# Patient Record
Sex: Female | Born: 1975 | Hispanic: No | Marital: Married | Smoking: Never smoker
Health system: Southern US, Community
[De-identification: ages and names within clinical notes are randomized; demographics above are authoritative.]

## PROBLEM LIST (undated history)

## (undated) DIAGNOSIS — B379 Candidiasis, unspecified: Secondary | ICD-10-CM

## (undated) DIAGNOSIS — G43909 Migraine, unspecified, not intractable, without status migrainosus: Secondary | ICD-10-CM

## (undated) DIAGNOSIS — N926 Irregular menstruation, unspecified: Secondary | ICD-10-CM

## (undated) DIAGNOSIS — C801 Malignant (primary) neoplasm, unspecified: Secondary | ICD-10-CM

## (undated) HISTORY — DX: Migraine, unspecified, not intractable, without status migrainosus: G43.909

## (undated) HISTORY — DX: Candidiasis, unspecified: B37.9

---
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
## 2006-01-14 ENCOUNTER — Other Ambulatory Visit: Admission: RE | Admit: 2006-01-14 | Discharge: 2006-01-14 | Payer: Self-pay | Admitting: Obstetrics and Gynecology

## 2011-03-01 ENCOUNTER — Emergency Department (HOSPITAL_COMMUNITY)
Admission: EM | Admit: 2011-03-01 | Discharge: 2011-03-01 | Disposition: A | Discharge status: Home / Self Care | Payer: BC Managed Care – PPO | Attending: Emergency Medicine | Admitting: Emergency Medicine

## 2011-03-01 DIAGNOSIS — J309 Allergic rhinitis, unspecified: Secondary | ICD-10-CM | POA: Insufficient documentation

## 2011-03-01 DIAGNOSIS — J329 Chronic sinusitis, unspecified: Secondary | ICD-10-CM | POA: Insufficient documentation

## 2011-03-01 DIAGNOSIS — R51 Headache: Secondary | ICD-10-CM | POA: Insufficient documentation

## 2011-03-01 DIAGNOSIS — H9209 Otalgia, unspecified ear: Secondary | ICD-10-CM | POA: Insufficient documentation

## 2012-12-16 ENCOUNTER — Ambulatory Visit (INDEPENDENT_AMBULATORY_CARE_PROVIDER_SITE_OTHER): Payer: BC Managed Care – PPO | Admitting: Obstetrics and Gynecology

## 2012-12-16 ENCOUNTER — Encounter: Payer: Self-pay | Admitting: Obstetrics and Gynecology

## 2012-12-16 VITALS — BP 110/68 | HR 72 | Ht 69.0 in | Wt 130.0 lb

## 2012-12-16 DIAGNOSIS — Z124 Encounter for screening for malignant neoplasm of cervix: Secondary | ICD-10-CM

## 2012-12-16 DIAGNOSIS — Z01419 Encounter for gynecological examination (general) (routine) without abnormal findings: Secondary | ICD-10-CM

## 2012-12-16 DIAGNOSIS — Z309 Encounter for contraceptive management, unspecified: Secondary | ICD-10-CM

## 2012-12-16 DIAGNOSIS — IMO0001 Reserved for inherently not codable concepts without codable children: Secondary | ICD-10-CM

## 2012-12-16 LAB — POCT URINE PREGNANCY: Preg Test, Ur: NEGATIVE

## 2012-12-16 NOTE — Progress Notes (Signed)
Regular Periods: yes Mammogram: no  Monthly Breast Ex.: yes Exercise: yes  Tetanus < 10 years: no Seatbelts: yes  NI. Bladder Functn.: yes Abuse at home: no  Daily BM's: yes Stressful Work: yes  Healthy Diet: yes Sigmoid-Colonoscopy: no  Calcium: no Medical problems this year: want to discuss birth control    LAST PAP:8/11  Contraception: condoms  Mammogram:  no  PCP: no  PMH: no changes  FMH: Dad dx with Lung cancer   7/13  Last Bone Scan: no   PT IS DIVORCED.

## 2012-12-16 NOTE — Progress Notes (Signed)
Subjective:    Leslie [name] is a 37 y.o. female, G2P2, who presents for an annual exam. The patient reports increased acne since stopping contraception in August.  Menstrual cycle:   LMP: Patient's last menstrual period was 12/02/2012.             Review of Systems Pertinent items are noted in HPI. Denies pelvic pain, urinary tract symptoms, vaginitis symptoms, irregular bleeding, menopausal symptoms, change in bowel habits or rectal bleeding   Objective:    BP 110/68  Pulse 72  Ht 5\' 9"  (1.753 m)  Wt 130 lb (58.968 kg)  BMI 19.20 kg/m2  LMP 12/02/2012   Wt Readings from Last 1 Encounters:  12/16/12 130 lb (58.968 kg)   Body mass index is 19.20 kg/(m^2). General Appearance: Alert, no acute distress HEENT: Grossly normal Neck / Thyroid: Supple, no thyromegaly or cervical adenopathy Lungs: Clear to auscultation bilaterally Back: No CVA tenderness Breast Exam: No masses or nodes.No dimpling, nipple retraction or discharge. Cardiovascular: Regular rate and rhythm.  Gastrointestinal: Soft, non-tender, no masses or organomegaly Pelvic Exam: EGBUS-wnl, vagina-normal rugae, cervix- without lesions or tenderness, uterus appears normal size shape and consistency, adnexae-no masses or tenderness Lymphatic Exam: Non-palpable nodes in neck, clavicular,  axillary, or inguinal regions  Skin: no rashes or abnormalities Extremities: no clubbing cyanosis or edema  Neurologic: grossly normal Psychiatric: Alert and oriented   Assessment:   Routine GYN Exam   Plan:  IUD Information Sheet,  Paragard Handout  Reviewed Paragard/Mirena IUD MOA, insertion, R & B including perforation and expulsion  PAP sent  RTO 1 year or prn  Unknown Flannigan,ELMIRAPA-C

## 2012-12-19 LAB — PAP IG W/ RFLX HPV ASCU

## 2014-10-15 ENCOUNTER — Encounter: Payer: Self-pay | Admitting: Obstetrics and Gynecology

## 2016-03-04 ENCOUNTER — Other Ambulatory Visit: Payer: Self-pay | Admitting: Obstetrics and Gynecology

## 2016-03-04 DIAGNOSIS — N6489 Other specified disorders of breast: Secondary | ICD-10-CM

## 2016-03-04 DIAGNOSIS — R928 Other abnormal and inconclusive findings on diagnostic imaging of breast: Secondary | ICD-10-CM

## 2016-03-11 ENCOUNTER — Ambulatory Visit
Admission: RE | Admit: 2016-03-11 | Discharge: 2016-03-11 | Disposition: A | Discharge status: Home / Self Care | Payer: BLUE CROSS/BLUE SHIELD | Source: Ambulatory Visit | Attending: Obstetrics and Gynecology | Admitting: Obstetrics and Gynecology

## 2016-03-11 ENCOUNTER — Other Ambulatory Visit: Payer: Self-pay

## 2016-03-11 DIAGNOSIS — R928 Other abnormal and inconclusive findings on diagnostic imaging of breast: Secondary | ICD-10-CM

## 2016-03-26 DIAGNOSIS — M791 Myalgia: Secondary | ICD-10-CM | POA: Diagnosis not present

## 2016-03-26 DIAGNOSIS — M9902 Segmental and somatic dysfunction of thoracic region: Secondary | ICD-10-CM | POA: Diagnosis not present

## 2016-03-26 DIAGNOSIS — M9901 Segmental and somatic dysfunction of cervical region: Secondary | ICD-10-CM | POA: Diagnosis not present

## 2016-03-26 DIAGNOSIS — M9903 Segmental and somatic dysfunction of lumbar region: Secondary | ICD-10-CM | POA: Diagnosis not present

## 2016-04-21 DIAGNOSIS — Z86018 Personal history of other benign neoplasm: Secondary | ICD-10-CM | POA: Diagnosis not present

## 2016-04-21 DIAGNOSIS — D2261 Melanocytic nevi of right upper limb, including shoulder: Secondary | ICD-10-CM | POA: Diagnosis not present

## 2016-04-21 DIAGNOSIS — D2271 Melanocytic nevi of right lower limb, including hip: Secondary | ICD-10-CM | POA: Diagnosis not present

## 2016-04-21 DIAGNOSIS — Z808 Family history of malignant neoplasm of other organs or systems: Secondary | ICD-10-CM | POA: Diagnosis not present

## 2016-05-15 DIAGNOSIS — M791 Myalgia: Secondary | ICD-10-CM | POA: Diagnosis not present

## 2016-05-15 DIAGNOSIS — M9903 Segmental and somatic dysfunction of lumbar region: Secondary | ICD-10-CM | POA: Diagnosis not present

## 2016-05-15 DIAGNOSIS — M9902 Segmental and somatic dysfunction of thoracic region: Secondary | ICD-10-CM | POA: Diagnosis not present

## 2016-05-15 DIAGNOSIS — M9901 Segmental and somatic dysfunction of cervical region: Secondary | ICD-10-CM | POA: Diagnosis not present

## 2016-06-09 DIAGNOSIS — M79672 Pain in left foot: Secondary | ICD-10-CM | POA: Diagnosis not present

## 2016-06-19 DIAGNOSIS — M9903 Segmental and somatic dysfunction of lumbar region: Secondary | ICD-10-CM | POA: Diagnosis not present

## 2016-06-19 DIAGNOSIS — M791 Myalgia: Secondary | ICD-10-CM | POA: Diagnosis not present

## 2016-06-19 DIAGNOSIS — M9902 Segmental and somatic dysfunction of thoracic region: Secondary | ICD-10-CM | POA: Diagnosis not present

## 2016-06-19 DIAGNOSIS — M9901 Segmental and somatic dysfunction of cervical region: Secondary | ICD-10-CM | POA: Diagnosis not present

## 2016-08-11 DIAGNOSIS — M9903 Segmental and somatic dysfunction of lumbar region: Secondary | ICD-10-CM | POA: Diagnosis not present

## 2016-08-11 DIAGNOSIS — M791 Myalgia: Secondary | ICD-10-CM | POA: Diagnosis not present

## 2016-08-11 DIAGNOSIS — M5408 Panniculitis affecting regions of neck and back, sacral and sacrococcygeal region: Secondary | ICD-10-CM | POA: Diagnosis not present

## 2016-08-11 DIAGNOSIS — M9901 Segmental and somatic dysfunction of cervical region: Secondary | ICD-10-CM | POA: Diagnosis not present

## 2016-09-22 DIAGNOSIS — M9905 Segmental and somatic dysfunction of pelvic region: Secondary | ICD-10-CM | POA: Diagnosis not present

## 2016-09-22 DIAGNOSIS — M5408 Panniculitis affecting regions of neck and back, sacral and sacrococcygeal region: Secondary | ICD-10-CM | POA: Diagnosis not present

## 2016-09-22 DIAGNOSIS — M9903 Segmental and somatic dysfunction of lumbar region: Secondary | ICD-10-CM | POA: Diagnosis not present

## 2016-09-22 DIAGNOSIS — M791 Myalgia: Secondary | ICD-10-CM | POA: Diagnosis not present

## 2016-11-20 DIAGNOSIS — M5408 Panniculitis affecting regions of neck and back, sacral and sacrococcygeal region: Secondary | ICD-10-CM | POA: Diagnosis not present

## 2016-11-20 DIAGNOSIS — M791 Myalgia: Secondary | ICD-10-CM | POA: Diagnosis not present

## 2016-11-20 DIAGNOSIS — M9905 Segmental and somatic dysfunction of pelvic region: Secondary | ICD-10-CM | POA: Diagnosis not present

## 2016-11-20 DIAGNOSIS — M9903 Segmental and somatic dysfunction of lumbar region: Secondary | ICD-10-CM | POA: Diagnosis not present

## 2016-12-29 DIAGNOSIS — M5383 Other specified dorsopathies, cervicothoracic region: Secondary | ICD-10-CM | POA: Diagnosis not present

## 2016-12-29 DIAGNOSIS — M62838 Other muscle spasm: Secondary | ICD-10-CM | POA: Diagnosis not present

## 2016-12-29 DIAGNOSIS — M9901 Segmental and somatic dysfunction of cervical region: Secondary | ICD-10-CM | POA: Diagnosis not present

## 2016-12-29 DIAGNOSIS — M9902 Segmental and somatic dysfunction of thoracic region: Secondary | ICD-10-CM | POA: Diagnosis not present

## 2017-02-12 DIAGNOSIS — M9902 Segmental and somatic dysfunction of thoracic region: Secondary | ICD-10-CM | POA: Diagnosis not present

## 2017-02-12 DIAGNOSIS — M9901 Segmental and somatic dysfunction of cervical region: Secondary | ICD-10-CM | POA: Diagnosis not present

## 2017-02-12 DIAGNOSIS — M5383 Other specified dorsopathies, cervicothoracic region: Secondary | ICD-10-CM | POA: Diagnosis not present

## 2017-02-12 DIAGNOSIS — M62838 Other muscle spasm: Secondary | ICD-10-CM | POA: Diagnosis not present

## 2017-03-11 DIAGNOSIS — Z1231 Encounter for screening mammogram for malignant neoplasm of breast: Secondary | ICD-10-CM | POA: Diagnosis not present

## 2017-03-11 DIAGNOSIS — Z6821 Body mass index (BMI) 21.0-21.9, adult: Secondary | ICD-10-CM | POA: Diagnosis not present

## 2017-03-11 DIAGNOSIS — Z124 Encounter for screening for malignant neoplasm of cervix: Secondary | ICD-10-CM | POA: Diagnosis not present

## 2017-03-11 DIAGNOSIS — Z01419 Encounter for gynecological examination (general) (routine) without abnormal findings: Secondary | ICD-10-CM | POA: Diagnosis not present

## 2017-04-09 DIAGNOSIS — M62838 Other muscle spasm: Secondary | ICD-10-CM | POA: Diagnosis not present

## 2017-04-09 DIAGNOSIS — M9902 Segmental and somatic dysfunction of thoracic region: Secondary | ICD-10-CM | POA: Diagnosis not present

## 2017-04-09 DIAGNOSIS — M9901 Segmental and somatic dysfunction of cervical region: Secondary | ICD-10-CM | POA: Diagnosis not present

## 2017-04-09 DIAGNOSIS — M5383 Other specified dorsopathies, cervicothoracic region: Secondary | ICD-10-CM | POA: Diagnosis not present

## 2017-05-19 DIAGNOSIS — K9041 Non-celiac gluten sensitivity: Secondary | ICD-10-CM | POA: Diagnosis not present

## 2017-05-19 DIAGNOSIS — Z6821 Body mass index (BMI) 21.0-21.9, adult: Secondary | ICD-10-CM | POA: Diagnosis not present

## 2017-05-19 DIAGNOSIS — J309 Allergic rhinitis, unspecified: Secondary | ICD-10-CM | POA: Diagnosis not present

## 2017-05-19 DIAGNOSIS — L817 Pigmented purpuric dermatosis: Secondary | ICD-10-CM | POA: Diagnosis not present

## 2017-06-24 DIAGNOSIS — M9902 Segmental and somatic dysfunction of thoracic region: Secondary | ICD-10-CM | POA: Diagnosis not present

## 2017-06-24 DIAGNOSIS — M62838 Other muscle spasm: Secondary | ICD-10-CM | POA: Diagnosis not present

## 2017-06-24 DIAGNOSIS — M5408 Panniculitis affecting regions of neck and back, sacral and sacrococcygeal region: Secondary | ICD-10-CM | POA: Diagnosis not present

## 2017-07-05 DIAGNOSIS — M9902 Segmental and somatic dysfunction of thoracic region: Secondary | ICD-10-CM | POA: Diagnosis not present

## 2017-07-05 DIAGNOSIS — M62838 Other muscle spasm: Secondary | ICD-10-CM | POA: Diagnosis not present

## 2017-07-05 DIAGNOSIS — M5408 Panniculitis affecting regions of neck and back, sacral and sacrococcygeal region: Secondary | ICD-10-CM | POA: Diagnosis not present

## 2017-07-15 DIAGNOSIS — Z0000 Encounter for general adult medical examination without abnormal findings: Secondary | ICD-10-CM | POA: Diagnosis not present

## 2017-07-15 DIAGNOSIS — Z114 Encounter for screening for human immunodeficiency virus [HIV]: Secondary | ICD-10-CM | POA: Diagnosis not present

## 2017-07-15 DIAGNOSIS — Z13220 Encounter for screening for lipoid disorders: Secondary | ICD-10-CM | POA: Diagnosis not present

## 2017-07-15 DIAGNOSIS — Z1329 Encounter for screening for other suspected endocrine disorder: Secondary | ICD-10-CM | POA: Diagnosis not present

## 2017-07-19 DIAGNOSIS — Z0000 Encounter for general adult medical examination without abnormal findings: Secondary | ICD-10-CM | POA: Diagnosis not present

## 2017-07-19 DIAGNOSIS — H6121 Impacted cerumen, right ear: Secondary | ICD-10-CM | POA: Diagnosis not present

## 2017-07-19 DIAGNOSIS — Z6821 Body mass index (BMI) 21.0-21.9, adult: Secondary | ICD-10-CM | POA: Diagnosis not present

## 2017-07-19 DIAGNOSIS — Z23 Encounter for immunization: Secondary | ICD-10-CM | POA: Diagnosis not present

## 2017-07-21 DIAGNOSIS — M5408 Panniculitis affecting regions of neck and back, sacral and sacrococcygeal region: Secondary | ICD-10-CM | POA: Diagnosis not present

## 2017-07-21 DIAGNOSIS — M62838 Other muscle spasm: Secondary | ICD-10-CM | POA: Diagnosis not present

## 2017-07-21 DIAGNOSIS — M9902 Segmental and somatic dysfunction of thoracic region: Secondary | ICD-10-CM | POA: Diagnosis not present

## 2017-08-12 DIAGNOSIS — Z808 Family history of malignant neoplasm of other organs or systems: Secondary | ICD-10-CM | POA: Diagnosis not present

## 2017-08-12 DIAGNOSIS — D2271 Melanocytic nevi of right lower limb, including hip: Secondary | ICD-10-CM | POA: Diagnosis not present

## 2017-08-12 DIAGNOSIS — D225 Melanocytic nevi of trunk: Secondary | ICD-10-CM | POA: Diagnosis not present

## 2017-08-12 DIAGNOSIS — D485 Neoplasm of uncertain behavior of skin: Secondary | ICD-10-CM | POA: Diagnosis not present

## 2017-08-12 DIAGNOSIS — Z86018 Personal history of other benign neoplasm: Secondary | ICD-10-CM | POA: Diagnosis not present

## 2017-08-24 DIAGNOSIS — Z23 Encounter for immunization: Secondary | ICD-10-CM | POA: Diagnosis not present

## 2017-08-27 DIAGNOSIS — M5408 Panniculitis affecting regions of neck and back, sacral and sacrococcygeal region: Secondary | ICD-10-CM | POA: Diagnosis not present

## 2017-08-27 DIAGNOSIS — M62838 Other muscle spasm: Secondary | ICD-10-CM | POA: Diagnosis not present

## 2017-08-27 DIAGNOSIS — M9902 Segmental and somatic dysfunction of thoracic region: Secondary | ICD-10-CM | POA: Diagnosis not present

## 2017-10-08 DIAGNOSIS — M5408 Panniculitis affecting regions of neck and back, sacral and sacrococcygeal region: Secondary | ICD-10-CM | POA: Diagnosis not present

## 2017-10-08 DIAGNOSIS — M9902 Segmental and somatic dysfunction of thoracic region: Secondary | ICD-10-CM | POA: Diagnosis not present

## 2017-10-08 DIAGNOSIS — M62838 Other muscle spasm: Secondary | ICD-10-CM | POA: Diagnosis not present

## 2017-11-19 DIAGNOSIS — M2560 Stiffness of unspecified joint, not elsewhere classified: Secondary | ICD-10-CM | POA: Diagnosis not present

## 2017-11-19 DIAGNOSIS — M9901 Segmental and somatic dysfunction of cervical region: Secondary | ICD-10-CM | POA: Diagnosis not present

## 2017-11-19 DIAGNOSIS — M62838 Other muscle spasm: Secondary | ICD-10-CM | POA: Diagnosis not present

## 2017-11-25 ENCOUNTER — Ambulatory Visit (INDEPENDENT_AMBULATORY_CARE_PROVIDER_SITE_OTHER): Payer: BLUE CROSS/BLUE SHIELD | Admitting: Physician Assistant

## 2017-11-25 DIAGNOSIS — Z23 Encounter for immunization: Secondary | ICD-10-CM

## 2017-11-25 MED ORDER — TYPHOID VACCINE PO CPDR: 1.0000 | DELAYED_RELEASE_CAPSULE | ORAL | 0 refills | Status: DC

## 2017-11-25 NOTE — Progress Notes (Signed)
Fast track flu shot only.

## 2017-12-14 DIAGNOSIS — C801 Malignant (primary) neoplasm, unspecified: Secondary | ICD-10-CM

## 2017-12-14 DIAGNOSIS — Z298 Encounter for other specified prophylactic measures: Secondary | ICD-10-CM

## 2017-12-14 DIAGNOSIS — C439 Malignant melanoma of skin, unspecified: Secondary | ICD-10-CM

## 2017-12-14 DIAGNOSIS — J701 Chronic and other pulmonary manifestations due to radiation: Secondary | ICD-10-CM

## 2017-12-14 DIAGNOSIS — Z2989 Encounter for other specified prophylactic measures: Secondary | ICD-10-CM

## 2017-12-14 HISTORY — DX: Malignant (primary) neoplasm, unspecified: C80.1

## 2017-12-14 HISTORY — DX: Encounter for other specified prophylactic measures: Z29.8

## 2017-12-14 HISTORY — DX: Malignant melanoma of skin, unspecified: C43.9

## 2017-12-14 HISTORY — DX: Chronic and other pulmonary manifestations due to radiation: J70.1

## 2017-12-14 HISTORY — DX: Encounter for other specified prophylactic measures: Z29.89

## 2017-12-31 DIAGNOSIS — M2560 Stiffness of unspecified joint, not elsewhere classified: Secondary | ICD-10-CM | POA: Diagnosis not present

## 2017-12-31 DIAGNOSIS — M62838 Other muscle spasm: Secondary | ICD-10-CM | POA: Diagnosis not present

## 2017-12-31 DIAGNOSIS — M9901 Segmental and somatic dysfunction of cervical region: Secondary | ICD-10-CM | POA: Diagnosis not present

## 2018-02-11 DIAGNOSIS — M2560 Stiffness of unspecified joint, not elsewhere classified: Secondary | ICD-10-CM | POA: Diagnosis not present

## 2018-02-11 DIAGNOSIS — M62838 Other muscle spasm: Secondary | ICD-10-CM | POA: Diagnosis not present

## 2018-02-11 DIAGNOSIS — M9902 Segmental and somatic dysfunction of thoracic region: Secondary | ICD-10-CM | POA: Diagnosis not present

## 2018-04-15 DIAGNOSIS — M62838 Other muscle spasm: Secondary | ICD-10-CM | POA: Diagnosis not present

## 2018-04-15 DIAGNOSIS — M9901 Segmental and somatic dysfunction of cervical region: Secondary | ICD-10-CM | POA: Diagnosis not present

## 2018-04-15 DIAGNOSIS — M2560 Stiffness of unspecified joint, not elsewhere classified: Secondary | ICD-10-CM | POA: Diagnosis not present

## 2018-04-20 DIAGNOSIS — Z01419 Encounter for gynecological examination (general) (routine) without abnormal findings: Secondary | ICD-10-CM | POA: Diagnosis not present

## 2018-04-20 DIAGNOSIS — Z1231 Encounter for screening mammogram for malignant neoplasm of breast: Secondary | ICD-10-CM | POA: Diagnosis not present

## 2018-04-20 DIAGNOSIS — Z6820 Body mass index (BMI) 20.0-20.9, adult: Secondary | ICD-10-CM | POA: Diagnosis not present

## 2018-04-20 DIAGNOSIS — Z124 Encounter for screening for malignant neoplasm of cervix: Secondary | ICD-10-CM | POA: Diagnosis not present

## 2018-04-25 ENCOUNTER — Encounter (HOSPITAL_COMMUNITY): Payer: Self-pay | Admitting: Emergency Medicine

## 2018-04-25 ENCOUNTER — Emergency Department (HOSPITAL_COMMUNITY): Payer: BLUE CROSS/BLUE SHIELD

## 2018-04-25 ENCOUNTER — Inpatient Hospital Stay (HOSPITAL_COMMUNITY)
Admission: EM | Admit: 2018-04-25 | Discharge: 2018-05-02 | DRG: 025 | Disposition: A | Discharge status: Home / Self Care | Payer: BLUE CROSS/BLUE SHIELD | Attending: Neurosurgery | Admitting: Neurosurgery

## 2018-04-25 DIAGNOSIS — D72829 Elevated white blood cell count, unspecified: Secondary | ICD-10-CM | POA: Diagnosis not present

## 2018-04-25 DIAGNOSIS — Z9889 Other specified postprocedural states: Secondary | ICD-10-CM

## 2018-04-25 DIAGNOSIS — R911 Solitary pulmonary nodule: Secondary | ICD-10-CM | POA: Diagnosis not present

## 2018-04-25 DIAGNOSIS — C3412 Malignant neoplasm of upper lobe, left bronchus or lung: Secondary | ICD-10-CM | POA: Diagnosis not present

## 2018-04-25 DIAGNOSIS — R51 Headache: Secondary | ICD-10-CM | POA: Diagnosis not present

## 2018-04-25 DIAGNOSIS — T380X5A Adverse effect of glucocorticoids and synthetic analogues, initial encounter: Secondary | ICD-10-CM | POA: Diagnosis not present

## 2018-04-25 DIAGNOSIS — C3432 Malignant neoplasm of lower lobe, left bronchus or lung: Secondary | ICD-10-CM

## 2018-04-25 DIAGNOSIS — Z08 Encounter for follow-up examination after completed treatment for malignant neoplasm: Secondary | ICD-10-CM | POA: Diagnosis not present

## 2018-04-25 DIAGNOSIS — G939 Disorder of brain, unspecified: Secondary | ICD-10-CM | POA: Diagnosis not present

## 2018-04-25 DIAGNOSIS — J984 Other disorders of lung: Secondary | ICD-10-CM

## 2018-04-25 DIAGNOSIS — Z9089 Acquired absence of other organs: Secondary | ICD-10-CM | POA: Diagnosis not present

## 2018-04-25 DIAGNOSIS — R1110 Vomiting, unspecified: Secondary | ICD-10-CM | POA: Diagnosis not present

## 2018-04-25 DIAGNOSIS — C7931 Secondary malignant neoplasm of brain: Secondary | ICD-10-CM | POA: Diagnosis not present

## 2018-04-25 DIAGNOSIS — Z79899 Other long term (current) drug therapy: Secondary | ICD-10-CM

## 2018-04-25 DIAGNOSIS — C3492 Malignant neoplasm of unspecified part of left bronchus or lung: Secondary | ICD-10-CM | POA: Diagnosis present

## 2018-04-25 DIAGNOSIS — R222 Localized swelling, mass and lump, trunk: Secondary | ICD-10-CM | POA: Diagnosis not present

## 2018-04-25 DIAGNOSIS — A150 Tuberculosis of lung: Secondary | ICD-10-CM | POA: Diagnosis not present

## 2018-04-25 DIAGNOSIS — Z7952 Long term (current) use of systemic steroids: Secondary | ICD-10-CM

## 2018-04-25 DIAGNOSIS — G9389 Other specified disorders of brain: Secondary | ICD-10-CM | POA: Diagnosis present

## 2018-04-25 DIAGNOSIS — G936 Cerebral edema: Secondary | ICD-10-CM | POA: Diagnosis not present

## 2018-04-25 DIAGNOSIS — R918 Other nonspecific abnormal finding of lung field: Secondary | ICD-10-CM | POA: Diagnosis not present

## 2018-04-25 DIAGNOSIS — D496 Neoplasm of unspecified behavior of brain: Secondary | ICD-10-CM | POA: Diagnosis not present

## 2018-04-25 DIAGNOSIS — D332 Benign neoplasm of brain, unspecified: Secondary | ICD-10-CM | POA: Diagnosis not present

## 2018-04-25 DIAGNOSIS — C712 Malignant neoplasm of temporal lobe: Secondary | ICD-10-CM | POA: Diagnosis not present

## 2018-04-25 LAB — COMPREHENSIVE METABOLIC PANEL
ALT: 17 U/L (ref 14–54)
AST: 20 U/L (ref 15–41)
Albumin: 3.9 g/dL (ref 3.5–5.0)
Alkaline Phosphatase: 93 U/L (ref 38–126)
Anion gap: 13 (ref 5–15)
BUN: 17 mg/dL (ref 6–20)
CO2: 25 mmol/L (ref 22–32)
Calcium: 9.3 mg/dL (ref 8.9–10.3)
Chloride: 100 mmol/L — ABNORMAL LOW (ref 101–111)
Creatinine, Ser: 0.9 mg/dL (ref 0.44–1.00)
GFR calc Af Amer: >60 mL/min (ref >60)
GFR calc non Af Amer: >60 mL/min (ref >60)
Glucose, Bld: 102 mg/dL — ABNORMAL HIGH (ref 65–99)
Potassium: 4.3 mmol/L (ref 3.5–5.1)
Sodium: 138 mmol/L (ref 135–145)
Total Bilirubin: 0.3 mg/dL (ref 0.3–1.2)
Total Protein: 7.6 g/dL (ref 6.5–8.1)

## 2018-04-25 LAB — CBC
HCT: 43.3 % (ref 36.0–46.0)
Hemoglobin: 14.2 g/dL (ref 12.0–15.0)
MCH: 30 pg (ref 26.0–34.0)
MCHC: 32.8 g/dL (ref 30.0–36.0)
MCV: 91.5 fL (ref 78.0–100.0)
Platelets: 448 10*3/uL — ABNORMAL HIGH (ref 150–400)
RBC: 4.73 MIL/uL (ref 3.87–5.11)
RDW: 12.4 % (ref 11.5–15.5)
WBC: 13.5 10*3/uL — ABNORMAL HIGH (ref 4.0–10.5)

## 2018-04-25 LAB — LIPASE, BLOOD: Lipase: 31 U/L (ref 11–51)

## 2018-04-25 LAB — I-STAT BETA HCG BLOOD, ED (MC, WL, AP ONLY): I-stat hCG, quantitative: <5 m[IU]/mL (ref <5)

## 2018-04-25 MED ORDER — SODIUM CHLORIDE 0.9 % IV BOLUS
1000.0000 mL | Freq: Once | INTRAVENOUS | Status: AC
  Administered 2018-04-26: 1000 mL via INTRAVENOUS

## 2018-04-25 MED ORDER — DEXAMETHASONE SODIUM PHOSPHATE 10 MG/ML IJ SOLN
10.0000 mg | Freq: Once | INTRAMUSCULAR | Status: AC
Start: 2018-04-25 — End: 2018-04-26
  Administered 2018-04-26: 10 mg via INTRAVENOUS
  Filled 2018-04-25: qty 1

## 2018-04-25 MED ORDER — SODIUM CHLORIDE 0.9 % IV BOLUS
1000.0000 mL | Freq: Once | INTRAVENOUS | Status: AC
  Administered 2018-04-25: 1000 mL via INTRAVENOUS

## 2018-04-25 NOTE — ED Notes (Signed)
Patient transported to CT 

## 2018-04-25 NOTE — ED Triage Notes (Signed)
Pt been vomiting all weekend, with entire body aches and headache. Pt wearing sunglasses in triage. Denies blurred vision. Been laying in dark room for past 48 hours. Denies urinary problems reports diarrhea once a day. Female visitor states that patient has been confused and saying things that havent made since over past couple days at times.

## 2018-04-25 NOTE — ED Provider Notes (Signed)
[facility] DEPT Provider Note   CSN: [id number] Arrival date & time: 04/25/18  1456     History   Chief Complaint Chief Complaint  Patient presents with  . Emesis    HPI Leslie [name] is a 42 y.o. female.  HPI  [name] is a 42yo female with a history of migraines who presents to the emergency department for evaluation of altered mental status. Patient is a difficult historian. States "I felt bad Friday, Saturday, Sunday, I felt really weird and tired." She speaks in partial sentences, does not elaborate. A friend brought her in who states that she has been acting unlike herself for the past three days. She lives at home with her two children, but they were away at friend's houses and she was mostly alone. When he visited her she was complaining of headache and generalized body aches but was reportedly sleeping all day. She told him that she had vomited but he had not seen her do this. She barely had anything to eat or drink. He is worried about dehydration given she is speaking very slowly and not making a lot of sense.   Patient denies alcohol or drug use. Denies taking any medication over the weekend. She states "I dont remember the past few days." When asked if she has a headache she responds "sometimes comes and goes." Does endorse bilateral blurry vision. Denies fever, chills, cough, shortness of breath, chest pain, abdominal pain, n/v, dysuria, urinary frequency. Denies any recent trauma or falls.    Past Medical History:  Diagnosis Date  . Migraines   . Yeast infection     There are no active problems to display for this patient.   History reviewed. No pertinent surgical history.   OB History    Gravida  2   Para  2   Term      Preterm      AB      Living  2     SAB      TAB      Ectopic      Multiple      Live Births               Home Medications    Prior to Admission medications   Medication  Sig Start Date End Date Taking? Authorizing Provider  fish oil-omega-3 fatty acids 1000 MG capsule Take 2 g by mouth daily.    [provider]  Multiple Vitamin (MULTIVITAMIN) tablet Take 1 tablet by mouth daily.    [provider]  Probiotic Product (PROBIOTIC DAILY PO) Take by mouth.    [provider]  QUERCETIN PO Take by mouth.    [provider]    Family History Family History  Problem Relation Age of Onset  . Cancer Father        lung    Social History Social History   Tobacco Use  . Smoking status: Never Smoker  . Smokeless tobacco: Never Used  Substance Use Topics  . Alcohol use: Yes  . Drug use: No     Allergies   Patient has no known allergies.   Review of Systems Review of Systems  Unable to perform ROS: Mental status change     Physical Exam Updated Vital Signs BP 121/80 (BP Location: Right Arm)   Pulse (!) 53   Temp 97.8 F (36.6 C) (Oral)   Resp 16   Ht 5' 9"  (1.753 m)   Wt 64.4  kg (142 lb)   LMP 04/18/2018 Comment: negative beta HCG 04/25/18  SpO2 100%   Breastfeeding? Unknown   BMI 20.97 kg/m   Physical Exam  Constitutional: She appears well-developed and well-nourished. No distress.  HENT:  Head: Normocephalic and atraumatic.  Mouth/Throat: Oropharynx is clear and moist. No oropharyngeal exudate.  Eyes: Pupils are equal, round, and reactive to light. Conjunctivae and EOM are normal. Right eye exhibits no discharge. Left eye exhibits no discharge.  Neck: Normal range of motion. Neck supple.  Cardiovascular: Normal rate, regular rhythm and intact distal pulses.  Pulmonary/Chest: Effort normal and breath sounds normal. No stridor. No respiratory distress. She has no wheezes. She has no rales.  Abdominal: Soft. There is no tenderness.  Neurological: She is alert. Coordination normal.  Mental Status:  Patient slow to answer questions.  Speaks in partial sentences.  She is oriented x3.  Has difficulty  giving a coherent history. Speech fluent without evidence of aphasia. Cranial Nerves:  II:  Peripheral visual fields grossly normal, pupils equal, round, reactive to light III,IV, VI: ptosis not present, extra-ocular motions intact bilaterally  V,VII: smile symmetric, facial light touch sensation equal VIII: hearing grossly normal to voice  X: uvula elevates symmetrically  XI: bilateral shoulder shrug symmetric and strong XII: midline tongue extension without fassiculations Motor:  Normal tone. 5/5 in upper and lower extremities bilaterally including strong and equal grip strength and dorsiflexion/plantar flexion Sensory: Pinprick and light touch normal in all extremities.  CV: distal pulses palpable throughout   Skin: Skin is warm and dry. She is not diaphoretic.  Psychiatric: She has a normal mood and affect. Her behavior is normal.  Nursing note and vitals reviewed.    ED Treatments / Results  Labs (all labs ordered are listed, but only abnormal results are displayed) Labs Reviewed  COMPREHENSIVE METABOLIC PANEL - Abnormal; Notable for the following components:      Result Value   Chloride 100 (*)    Glucose, Bld 102 (*)    All other components within normal limits  CBC - Abnormal; Notable for the following components:   WBC 13.5 (*)    Platelets 448 (*)    All other components within normal limits  LIPASE, BLOOD  URINALYSIS, ROUTINE W REFLEX MICROSCOPIC  I-STAT BETA HCG BLOOD, ED ([facility], [facility], [facility] ONLY)    EKG None  Radiology Dg Chest 2 View  Result Date: 04/25/2018 CLINICAL DATA:  Vomiting, body aches and headache. EXAM: CHEST - 2 VIEW COMPARISON:  None. FINDINGS: Heart size and mediastinal contours are within normal limits. Rounded mass overlies the LEFT lower lung, measuring 5 cm greatest dimension, most likely LEFT lower lobe as there is some associated obscuration of the LEFT heart border, less discretely seen on the lateral view but likely in the lingula based on the  lateral view. RIGHT lung is clear. No pleural effusion or pneumothorax seen. Osseous structures about the chest are unremarkable. IMPRESSION: Rounded mass overlying the LEFT lung base, measuring 5 cm greatest dimension, most likely within the lingula, less likely within the breast or other site outside the chest. Favor primary lung cancer. Recommend chest CT with contrast for further characterization. These results were called by telephone at the time of interpretation on 04/25/2018 at 11:38 pm to Dr. Shirlyn Goltz , who verbally acknowledged these results. Electronically Signed   By: Franki Cabot M.D.   On: 04/25/2018 23:39   Ct Head Wo Contrast  Result Date: 04/25/2018 CLINICAL DATA:  Vomiting all week  in, body aches and headache. EXAM: CT HEAD WITHOUT CONTRAST TECHNIQUE: Contiguous axial images were obtained from the base of the skull through the vertex without intravenous contrast. COMPARISON:  None. FINDINGS: Brain: Mass within the LEFT temporal lobe measures approximately 2.8 cm, with extensive surrounding vasogenic edema which extends upwards into the LEFT frontoparietal lobe above the level of the LEFT lateral ventricle. There is associated mass effect with effacement of the LEFT lateral ventricle and a rightward midline shift which measures approximately 10 mm. No parenchymal hemorrhage or extra-axial hemorrhage identified. Vascular: No hyperdense vessel or unexpected calcification. Skull: Normal. Negative for fracture or focal lesion. Sinuses/Orbits: No acute finding. Other: None. IMPRESSION: Mass within the LEFT temporal lobe, almost certainly neoplastic, with associated large amount of vasogenic edema which extends upwards from the LEFT temporal lobe into the LEFT frontoparietal lobe at and just above the level of the lateral ventricles. Associated mass effect with effacement of the LEFT lateral ventricle and rightward midline shift measuring approximately 10 mm. Also suspect some degree of LEFT-sided  uncal herniation. No evidence of tonsillar or transtentorial herniation. No intracranial hemorrhage. Recommend brain MRI with contrast for further characterization. These results and recommendations were called by telephone at the time of interpretation on 04/25/2018 at 11:30 pm to Dr. Geanie Kenning , who verbally acknowledged these results. Electronically Signed   By: Franki Cabot M.D.   On: 04/25/2018 23:33   Ct Chest W Contrast  Result Date: 04/26/2018 CLINICAL DATA:  Nausea vomiting with body ache EXAM: CT CHEST, ABDOMEN, AND PELVIS WITH CONTRAST TECHNIQUE: Multidetector CT imaging of the chest, abdomen and pelvis was performed following the standard protocol during bolus administration of intravenous contrast. CONTRAST:  143m ISOVUE-300 IOPAMIDOL (ISOVUE-300) INJECTION 61% COMPARISON:  Chest x-ray 04/25/2018 FINDINGS: CT CHEST FINDINGS Cardiovascular: Nonaneurysmal aorta. No pericardial effusion. Normal heart size. Mediastinum/Nodes: Subcentimeter hypodensity right lobe of thyroid. Midline trachea. 9 mm lymph node adjacent to the anterior arch. Esophagus within normal limits. Lungs/Pleura: No pleural effusion or pneumothorax. 5.6 x 3.9 cm heterogenous mass within the lingula. Musculoskeletal: No acute or suspicious abnormality. CT ABDOMEN PELVIS FINDINGS Hepatobiliary: No focal liver abnormality is seen. No gallstones, gallbladder wall thickening, or biliary dilatation. Pancreas: Unremarkable. No pancreatic ductal dilatation or surrounding inflammatory changes. Spleen: Normal in size without focal abnormality. Adrenals/Urinary Tract: Adrenal glands are unremarkable. Kidneys are normal, without renal calculi, focal lesion, or hydronephrosis. Bladder is unremarkable. Stomach/Bowel: Stomach is within normal limits. Appendix appears normal. No evidence of bowel wall thickening, distention, or inflammatory changes. Vascular/Lymphatic: Nonaneurysmal aorta.  No significant adenopathy. Reproductive: Uterus and  bilateral adnexa are unremarkable. Other: Small free fluid in the pelvis.  No free air. Musculoskeletal: No acute or significant osseous findings. IMPRESSION: 1. 5.6 x 3.9 cm heterogenous solid enhancing mass in the left lower lung. Mass is not separable from the pericardium and appears to displace surrounding bronchi and left pulmonary fissure, suggesting that this represents a pericardial mass as opposed to an intraparenchymal lung mass. 2. Few prominent lymph nodes adjacent to the aortic arch. 3. No CT evidence for acute intra-abdominal or pelvic abnormality. Small amount of free fluid in the pelvis. Electronically Signed   By: KDonavan FoilM.D.   On: 04/26/2018 01:59   Ct Abdomen Pelvis W Contrast  Result Date: 04/26/2018 CLINICAL DATA:  Nausea vomiting with body ache EXAM: CT CHEST, ABDOMEN, AND PELVIS WITH CONTRAST TECHNIQUE: Multidetector CT imaging of the chest, abdomen and pelvis was performed following the standard protocol during bolus administration of  intravenous contrast. CONTRAST:  165m ISOVUE-300 IOPAMIDOL (ISOVUE-300) INJECTION 61% COMPARISON:  Chest x-ray 04/25/2018 FINDINGS: CT CHEST FINDINGS Cardiovascular: Nonaneurysmal aorta. No pericardial effusion. Normal heart size. Mediastinum/Nodes: Subcentimeter hypodensity right lobe of thyroid. Midline trachea. 9 mm lymph node adjacent to the anterior arch. Esophagus within normal limits. Lungs/Pleura: No pleural effusion or pneumothorax. 5.6 x 3.9 cm heterogenous mass within the lingula. Musculoskeletal: No acute or suspicious abnormality. CT ABDOMEN PELVIS FINDINGS Hepatobiliary: No focal liver abnormality is seen. No gallstones, gallbladder wall thickening, or biliary dilatation. Pancreas: Unremarkable. No pancreatic ductal dilatation or surrounding inflammatory changes. Spleen: Normal in size without focal abnormality. Adrenals/Urinary Tract: Adrenal glands are unremarkable. Kidneys are normal, without renal calculi, focal lesion, or  hydronephrosis. Bladder is unremarkable. Stomach/Bowel: Stomach is within normal limits. Appendix appears normal. No evidence of bowel wall thickening, distention, or inflammatory changes. Vascular/Lymphatic: Nonaneurysmal aorta.  No significant adenopathy. Reproductive: Uterus and bilateral adnexa are unremarkable. Other: Small free fluid in the pelvis.  No free air. Musculoskeletal: No acute or significant osseous findings. IMPRESSION: 1. 5.6 x 3.9 cm heterogenous solid enhancing mass in the left lower lung. Mass is not separable from the pericardium and appears to displace surrounding bronchi and left pulmonary fissure, suggesting that this represents a pericardial mass as opposed to an intraparenchymal lung mass. 2. Few prominent lymph nodes adjacent to the aortic arch. 3. No CT evidence for acute intra-abdominal or pelvic abnormality. Small amount of free fluid in the pelvis. Electronically Signed   By: KDonavan FoilM.D.   On: 04/26/2018 01:59    Procedures Procedures (including critical care time)  Medications Ordered in ED Medications  iopamidol (ISOVUE-300) 61 % injection (has no administration in time range)  dexamethasone (DECADRON) injection 4 mg (4 mg Intravenous Given 04/26/18 0535)  acetaminophen (TYLENOL) tablet 650 mg (has no administration in time range)    Or  acetaminophen (TYLENOL) suppository 650 mg (has no administration in time range)  ondansetron (ZOFRAN) tablet 4 mg ( Oral See Alternative 04/26/18 0412)    Or  ondansetron (ZOFRAN) injection 4 mg (4 mg Intravenous Given 04/26/18 0412)  enoxaparin (LOVENOX) injection 40 mg (has no administration in time range)  feeding supplement (ENSURE ENLIVE) (ENSURE ENLIVE) liquid 237 mL (has no administration in time range)  sodium chloride 0.9 % bolus 1,000 mL (0 mLs Intravenous Stopped 04/26/18 0002)  sodium chloride 0.9 % bolus 1,000 mL (0 mLs Intravenous Stopped 04/26/18 0324)  dexamethasone (DECADRON) injection 10 mg (10 mg  Intravenous Given 04/26/18 0006)  iopamidol (ISOVUE-300) 61 % injection 100 mL (100 mLs Intravenous Contrast Given 04/26/18 0052)     Initial Impression / Assessment and Plan / ED Course  I have reviewed the triage vital signs and the nursing notes.  Pertinent labs & imaging results that were available during my care of the patient were reviewed by me and considered in my medical decision making (see chart for details).    Patient presents with altered mental status.  CT head reveals 2.8 cm left temporal mass which appears neoplastic. Surrounding edema with mass-effect and 1 cm shift to the right.  Chest x-ray reveals 5 cm mass.  Concern of primary lung cancer with mets to the brain.  CT chest, abdomen and pelvis ordered for further evaluation.  CT chest reveals mass in the left lung which appears to originate from the pericardium.  Labs reviewed, she has a mild leukocytosis with WBC count 13.5.  Hemoglobin stable.  CMP without any major electrolyte abnormalities, kidney function  normal and liver enzymes within normal.  Rapid HIV screen negative.  Beta hCG negative.  Rapid drug screen negative.  UA without evidence of infection.  Discussed this patient with neurosurgeon Dr. Trenton Gammon who would like IV Decadron for brain swelling.  He would like patient admitted to [facility] so that he can evaluate the patient in the morning.  Patient has not had any seizures, therefore he would like to hold off on seizure prophylaxis.  Discussed this patient with Hospitalist Dr. Alcario Drought who will admit the patient.  This was a shared visit with Dr. Darl Householder who also saw the patient and agrees with the above plan.  Patient and her friend at bedside informed.  Final Clinical Impressions(s) / ED Diagnoses   Final diagnoses:  None    ED Discharge Orders    None       Bernarda Caffey 04/26/18 [id number]    Drenda Freeze, MD 04/27/18 [phone]

## 2018-04-26 ENCOUNTER — Inpatient Hospital Stay (HOSPITAL_COMMUNITY): Payer: BLUE CROSS/BLUE SHIELD

## 2018-04-26 ENCOUNTER — Other Ambulatory Visit: Payer: Self-pay

## 2018-04-26 ENCOUNTER — Emergency Department (HOSPITAL_COMMUNITY): Payer: BLUE CROSS/BLUE SHIELD

## 2018-04-26 ENCOUNTER — Encounter (HOSPITAL_COMMUNITY): Payer: Self-pay

## 2018-04-26 DIAGNOSIS — D332 Benign neoplasm of brain, unspecified: Secondary | ICD-10-CM | POA: Diagnosis not present

## 2018-04-26 DIAGNOSIS — R911 Solitary pulmonary nodule: Secondary | ICD-10-CM | POA: Diagnosis not present

## 2018-04-26 DIAGNOSIS — C3412 Malignant neoplasm of upper lobe, left bronchus or lung: Secondary | ICD-10-CM | POA: Diagnosis not present

## 2018-04-26 DIAGNOSIS — C3492 Malignant neoplasm of unspecified part of left bronchus or lung: Secondary | ICD-10-CM | POA: Diagnosis not present

## 2018-04-26 DIAGNOSIS — T380X5A Adverse effect of glucocorticoids and synthetic analogues, initial encounter: Secondary | ICD-10-CM | POA: Diagnosis not present

## 2018-04-26 DIAGNOSIS — G9389 Other specified disorders of brain: Secondary | ICD-10-CM | POA: Diagnosis present

## 2018-04-26 DIAGNOSIS — Z08 Encounter for follow-up examination after completed treatment for malignant neoplasm: Secondary | ICD-10-CM | POA: Diagnosis not present

## 2018-04-26 DIAGNOSIS — Z79899 Other long term (current) drug therapy: Secondary | ICD-10-CM | POA: Diagnosis not present

## 2018-04-26 DIAGNOSIS — G936 Cerebral edema: Secondary | ICD-10-CM | POA: Diagnosis not present

## 2018-04-26 DIAGNOSIS — Z9089 Acquired absence of other organs: Secondary | ICD-10-CM | POA: Diagnosis not present

## 2018-04-26 DIAGNOSIS — C712 Malignant neoplasm of temporal lobe: Secondary | ICD-10-CM | POA: Diagnosis not present

## 2018-04-26 DIAGNOSIS — R1110 Vomiting, unspecified: Secondary | ICD-10-CM | POA: Diagnosis not present

## 2018-04-26 DIAGNOSIS — Z7952 Long term (current) use of systemic steroids: Secondary | ICD-10-CM | POA: Diagnosis not present

## 2018-04-26 DIAGNOSIS — C7931 Secondary malignant neoplasm of brain: Principal | ICD-10-CM

## 2018-04-26 DIAGNOSIS — R222 Localized swelling, mass and lump, trunk: Secondary | ICD-10-CM | POA: Diagnosis not present

## 2018-04-26 DIAGNOSIS — R918 Other nonspecific abnormal finding of lung field: Secondary | ICD-10-CM | POA: Diagnosis not present

## 2018-04-26 DIAGNOSIS — D496 Neoplasm of unspecified behavior of brain: Secondary | ICD-10-CM | POA: Diagnosis not present

## 2018-04-26 DIAGNOSIS — D72829 Elevated white blood cell count, unspecified: Secondary | ICD-10-CM | POA: Diagnosis not present

## 2018-04-26 DIAGNOSIS — G939 Disorder of brain, unspecified: Secondary | ICD-10-CM | POA: Diagnosis not present

## 2018-04-26 LAB — RAPID HIV SCREEN (HIV 1/2 AB+AG)
HIV 1/2 Antibodies: NONREACTIVE
HIV-1 P24 Antigen - HIV24: NONREACTIVE

## 2018-04-26 LAB — RAPID URINE DRUG SCREEN, HOSP PERFORMED
Amphetamines: NOT DETECTED
Barbiturates: NOT DETECTED
Benzodiazepines: NOT DETECTED
Cocaine: NOT DETECTED
Opiates: NOT DETECTED
Tetrahydrocannabinol: NOT DETECTED

## 2018-04-26 LAB — ETHANOL: Alcohol, Ethyl (B): <10 mg/dL (ref <10)

## 2018-04-26 LAB — URINALYSIS, ROUTINE W REFLEX MICROSCOPIC
Bilirubin Urine: NEGATIVE
Glucose, UA: NEGATIVE mg/dL
Hgb urine dipstick: NEGATIVE
Ketones, ur: 5 mg/dL — AB
Leukocytes, UA: NEGATIVE
Nitrite: NEGATIVE
Protein, ur: NEGATIVE mg/dL
Specific Gravity, Urine: 1.028 (ref 1.005–1.030)
pH: 6 (ref 5.0–8.0)

## 2018-04-26 LAB — SEDIMENTATION RATE
Sed Rate: 30 mm/hr — ABNORMAL HIGH (ref 0–22)
Sed Rate: 79 mm/hr — ABNORMAL HIGH (ref 0–22)

## 2018-04-26 LAB — ACETAMINOPHEN LEVEL: Acetaminophen (Tylenol), Serum: <10 ug/mL — ABNORMAL LOW (ref 10–30)

## 2018-04-26 LAB — CK: Total CK: 67 U/L (ref 38–234)

## 2018-04-26 LAB — SALICYLATE LEVEL: Salicylate Lvl: <7 mg/dL (ref 2.8–30.0)

## 2018-04-26 MED ORDER — ENOXAPARIN SODIUM 40 MG/0.4ML ~~LOC~~ SOLN
40.0000 mg | Freq: Every day | SUBCUTANEOUS | Status: DC
  Administered 2018-04-26: 40 mg via SUBCUTANEOUS
  Filled 2018-04-26 (×2): qty 0.4

## 2018-04-26 MED ORDER — ACETAMINOPHEN 650 MG RE SUPP: 650.0000 mg | Freq: Four times a day (QID) | RECTAL | Status: DC | PRN

## 2018-04-26 MED ORDER — DEXAMETHASONE SODIUM PHOSPHATE 4 MG/ML IJ SOLN
4.0000 mg | Freq: Four times a day (QID) | INTRAMUSCULAR | Status: DC
  Administered 2018-04-26 (×2): 4 mg via INTRAVENOUS
  Filled 2018-04-26 (×2): qty 1

## 2018-04-26 MED ORDER — ENOXAPARIN SODIUM 40 MG/0.4ML ~~LOC~~ SOLN
40.0000 mg | Freq: Every day | SUBCUTANEOUS | Status: DC
  Filled 2018-04-26: qty 0.4

## 2018-04-26 MED ORDER — ONDANSETRON HCL 4 MG/2ML IJ SOLN
4.0000 mg | Freq: Four times a day (QID) | INTRAMUSCULAR | Status: DC | PRN
  Administered 2018-04-26: 4 mg via INTRAVENOUS
  Filled 2018-04-26: qty 2

## 2018-04-26 MED ORDER — IOPAMIDOL (ISOVUE-300) INJECTION 61%
INTRAVENOUS | Status: AC
  Filled 2018-04-26: qty 100

## 2018-04-26 MED ORDER — GADOBENATE DIMEGLUMINE 529 MG/ML IV SOLN
15.0000 mL | Freq: Once | INTRAVENOUS | Status: AC
  Administered 2018-04-26: 13 mL via INTRAVENOUS

## 2018-04-26 MED ORDER — DEXAMETHASONE SODIUM PHOSPHATE 4 MG/ML IJ SOLN
4.0000 mg | Freq: Four times a day (QID) | INTRAMUSCULAR | Status: DC
Start: 2018-04-26 — End: 2018-04-29
  Administered 2018-04-27 – 2018-04-28 (×7): 4 mg via INTRAVENOUS
  Administered 2018-04-29: 10 mg via INTRAVENOUS
  Administered 2018-04-29 (×2): 4 mg via INTRAVENOUS
  Filled 2018-04-26 (×10): qty 1

## 2018-04-26 MED ORDER — ACETAMINOPHEN 325 MG PO TABS: 650.0000 mg | ORAL_TABLET | Freq: Four times a day (QID) | ORAL | Status: DC | PRN

## 2018-04-26 MED ORDER — ENSURE ENLIVE PO LIQD: 237.0000 mL | Freq: Two times a day (BID) | ORAL | Status: DC

## 2018-04-26 MED ORDER — ONDANSETRON HCL 4 MG PO TABS: 4.0000 mg | ORAL_TABLET | Freq: Four times a day (QID) | ORAL | Status: DC | PRN

## 2018-04-26 MED ORDER — IOPAMIDOL (ISOVUE-300) INJECTION 61%
100.0000 mL | Freq: Once | INTRAVENOUS | Status: AC | PRN
  Administered 2018-04-26: 100 mL via INTRAVENOUS

## 2018-04-26 MED ORDER — ZOLPIDEM TARTRATE 5 MG PO TABS
5.0000 mg | ORAL_TABLET | Freq: Once | ORAL | Status: AC
  Administered 2018-04-26: 5 mg via ORAL
  Filled 2018-04-26: qty 1

## 2018-04-26 MED ORDER — SODIUM CHLORIDE 0.9 % IV SOLN
INTRAVENOUS | Status: DC
  Administered 2018-04-26 – 2018-04-29 (×4): via INTRAVENOUS

## 2018-04-26 NOTE — Progress Notes (Signed)
Reason for Consult: Brain mass Referring Physician: Medicine  Leslie [name] is an 42 y.o. female.  HPI: 42 year old female admitted with newly discovered a left temporal mass and left chest mass.  Patient with a one-week history of increasing anxiety some headaches some nausea vomiting and generalized uneasiness.  No known history of cancer.  No recent weight change.  Patient otherwise in good health.  No history of fevers, night sweats or other constitutional symptoms.  Past Medical History:  Diagnosis Date  . Migraines   . Yeast infection     History reviewed. No pertinent surgical history.  Family History  Problem Relation Age of Onset  . Cancer Father        lung    Social History:  reports that she has never smoked. She has never used smokeless tobacco. She reports that she drinks alcohol. She reports that she does not use drugs.  Allergies: No Known Allergies  Medications: I have reviewed the patient's current medications.  Results for orders placed or performed during the hospital encounter of 04/25/18 (from the past 48 hour(s))  Lipase, blood     Status: None   Collection Time: 04/25/18  3:32 PM  Result Value Ref Range   Lipase 31 11 - 51 U/L    Comment: Performed at [facility], [place] [address], [place], [place] [address]  Comprehensive metabolic panel     Status: Abnormal   Collection Time: 04/25/18  3:32 PM  Result Value Ref Range   Sodium 138 135 - 145 mmol/L   Potassium 4.3 3.5 - 5.1 mmol/L   Chloride 100 (L) 101 - 111 mmol/L   CO2 25 22 - 32 mmol/L   Glucose, Bld 102 (H) 65 - 99 mg/dL   BUN 17 6 - 20 mg/dL   Creatinine, Ser 0.90 0.44 - 1.00 mg/dL   Calcium 9.3 8.9 - 10.3 mg/dL   Total Protein 7.6 6.5 - 8.1 g/dL   Albumin 3.9 3.5 - 5.0 g/dL   AST 20 15 - 41 U/L   ALT 17 14 - 54 U/L   Alkaline Phosphatase 93 38 - 126 U/L   Total Bilirubin 0.3 0.3 - 1.2 mg/dL   GFR calc non Af Amer >60 >60 mL/min   GFR calc Af Amer >60 >60 mL/min     Comment: (NOTE) The eGFR has been calculated using the CKD EPI equation. This calculation has not been validated in all clinical situations. eGFR's persistently <60 mL/min signify possible Chronic Kidney Disease.    Anion gap 13 5 - 15    Comment: Performed at [facility], [place] [address] [place], [place] [address]  CBC     Status: Abnormal   Collection Time: 04/25/18  3:32 PM  Result Value Ref Range   WBC 13.5 (H) 4.0 - 10.5 K/uL   RBC 4.73 3.87 - 5.11 MIL/uL   Hemoglobin 14.2 12.0 - 15.0 g/dL   HCT 43.3 36.0 - 46.0 %   MCV 91.5 78.0 - 100.0 fL   MCH 30.0 26.0 - 34.0 pg   MCHC 32.8 30.0 - 36.0 g/dL   RDW 12.4 11.5 - 15.5 %   Platelets 448 (H) 150 - 400 K/uL    Comment: Performed at [facility], [place] [address], [place], [place] [address]  I-Stat beta hCG blood, ED     Status: None   Collection Time: 04/25/18  3:47 PM  Result Value Ref Range   I-stat hCG, quantitative <5.0 <5  mIU/mL   Comment 3            Comment:   GEST. AGE      CONC.  (mIU/mL)   <=1 WEEK        5 - 50     2 WEEKS       50 - 500     3 WEEKS       100 - 10,000     4 WEEKS     1,000 - 30,000        FEMALE AND NON-PREGNANT FEMALE:     LESS THAN 5 mIU/mL   Ethanol     Status: None   Collection Time: 04/25/18 11:08 PM  Result Value Ref Range   Alcohol, Ethyl (B) <10 <10 mg/dL    Comment:        LOWEST DETECTABLE LIMIT FOR SERUM ALCOHOL IS 10 mg/dL FOR MEDICAL PURPOSES ONLY Performed at [place] [address] [place], [place] [address]   Acetaminophen level     Status: Abnormal   Collection Time: 04/25/18 11:08 PM  Result Value Ref Range   Acetaminophen (Tylenol), Serum <10 (L) 10 - 30 ug/mL    Comment:        THERAPEUTIC CONCENTRATIONS VARY SIGNIFICANTLY. A RANGE OF 10-30 ug/mL MAY BE AN EFFECTIVE CONCENTRATION FOR MANY PATIENTS. HOWEVER, SOME ARE BEST TREATED AT CONCENTRATIONS OUTSIDE THIS RANGE. ACETAMINOPHEN  CONCENTRATIONS >150 ug/mL AT 4 HOURS AFTER INGESTION AND >50 ug/mL AT 12 HOURS AFTER INGESTION ARE OFTEN ASSOCIATED WITH TOXIC REACTIONS. Performed at [facility], [place] [address] [place], [place] [address]   Salicylate level     Status: None   Collection Time: 04/25/18 11:08 PM  Result Value Ref Range   Salicylate Lvl <1.2 2.8 - 30.0 mg/dL    Comment: Performed at [facility], [place] [address], [place], [place] [address]  CK     Status: None   Collection Time: 04/25/18 11:08 PM  Result Value Ref Range   Total CK 67 38 - 234 U/L    Comment: Performed at [facility], [place] [address], [place], [place] [address]  Rapid HIV screen (HIV 1/2 Ab+Ag)     Status: None   Collection Time: 04/25/18 11:08 PM  Result Value Ref Range   HIV-1 P24 Antigen - HIV24 NON REACTIVE NON REACTIVE   HIV 1/2 Antibodies NON REACTIVE NON REACTIVE   Interpretation (HIV Ag Ab)      A non reactive test result means that HIV 1 or HIV 2 antibodies and HIV 1 p24 antigen were not detected in the specimen.    Comment: RESULT CALLED TO, READ BACK BY AND VERIFIED WITHJudie Grieve RN [id number] 04/26/18 A NAVARRO Performed at [facility], [place] [address] [place], [place] [address]   Sedimentation rate     Status: Abnormal   Collection Time: 04/25/18 11:08 PM  Result Value Ref Range   Sed Rate 79 (H) 0 - 22 mm/hr    Comment: Performed at [facility], [place] [address] [place], [place] [address]  Sedimentation rate     Status: Abnormal   Collection Time: 04/25/18 11:08 PM  Result Value Ref Range   Sed Rate 30 (H) 0 - 22 mm/hr    Comment: Performed at [facility], [place] [address], [place], [place] [address]  Urinalysis, Routine w reflex microscopic     Status: Abnormal   Collection Time: 04/26/18  3:25 AM  Result Value Ref Range   Color, Urine STRAW (A) YELLOW   APPearance CLEAR CLEAR   Specific Gravity, Urine  1.028 1.005 - 1.030   pH 6.0 5.0 - 8.0   Glucose, UA NEGATIVE NEGATIVE mg/dL   Hgb urine dipstick NEGATIVE NEGATIVE   Bilirubin Urine NEGATIVE NEGATIVE   Ketones, ur 5 (A) NEGATIVE mg/dL   Protein, ur NEGATIVE NEGATIVE mg/dL   Nitrite NEGATIVE NEGATIVE   Leukocytes, UA NEGATIVE NEGATIVE    Comment: Performed at [place] [address] [place], [place] [address]  Rapid urine drug screen (hospital performed)     Status: None   Collection Time: 04/26/18  3:25 AM  Result Value Ref Range   Opiates NONE DETECTED NONE DETECTED   Cocaine NONE DETECTED NONE DETECTED   Benzodiazepines NONE DETECTED NONE DETECTED   Amphetamines NONE DETECTED NONE DETECTED   Tetrahydrocannabinol NONE DETECTED NONE DETECTED   Barbiturates NONE DETECTED NONE DETECTED    Comment: (NOTE) DRUG SCREEN FOR MEDICAL PURPOSES ONLY.  IF CONFIRMATION IS NEEDED FOR ANY PURPOSE, NOTIFY LAB WITHIN 5 DAYS. LOWEST DETECTABLE LIMITS FOR URINE DRUG SCREEN Drug Class                     Cutoff (ng/mL) Amphetamine and metabolites    1000 Barbiturate and metabolites    200 Benzodiazepine                 580 Tricyclics and metabolites     300 Opiates and metabolites        300 Cocaine and metabolites        300 THC                            50 Performed at [facility], [place] [address] [place], [place] [address]     Dg Chest 2 View  Result Date: 04/25/2018 CLINICAL DATA:  Vomiting, body aches and headache. EXAM: CHEST - 2 VIEW COMPARISON:  None. FINDINGS: Heart size and mediastinal contours are within normal limits. Rounded mass overlies the LEFT lower lung, measuring 5 cm greatest dimension, most likely LEFT lower lobe as there is some associated obscuration of the LEFT heart border, less discretely seen on the lateral view but likely in the lingula based on the lateral view. RIGHT lung is clear. No pleural effusion or pneumothorax seen. Osseous structures about the chest are  unremarkable. IMPRESSION: Rounded mass overlying the LEFT lung base, measuring 5 cm greatest dimension, most likely within the lingula, less likely within the breast or other site outside the chest. Favor primary lung cancer. Recommend chest CT with contrast for further characterization. These results were called by telephone at the time of interpretation on 04/25/2018 at 11:38 pm to Dr. Shirlyn Goltz , who verbally acknowledged these results. Electronically Signed   By: Franki Cabot M.D.   On: 04/25/2018 23:39   Ct Head Wo Contrast  Result Date: 04/25/2018 CLINICAL DATA:  Vomiting all week in, body aches and headache. EXAM: CT HEAD WITHOUT CONTRAST TECHNIQUE: Contiguous axial images were obtained from the base of the skull through the vertex without intravenous contrast. COMPARISON:  None. FINDINGS: Brain: Mass within the LEFT temporal lobe measures approximately 2.8 cm, with extensive surrounding vasogenic edema which extends upwards into the LEFT frontoparietal lobe above the level of the LEFT lateral ventricle. There is associated mass effect with effacement of the LEFT lateral ventricle  and a rightward midline shift which measures approximately 10 mm. No parenchymal hemorrhage or extra-axial hemorrhage identified. Vascular: No hyperdense vessel or unexpected calcification. Skull: Normal. Negative for fracture or focal lesion. Sinuses/Orbits: No acute finding. Other: None. IMPRESSION: Mass within the LEFT temporal lobe, almost certainly neoplastic, with associated large amount of vasogenic edema which extends upwards from the LEFT temporal lobe into the LEFT frontoparietal lobe at and just above the level of the lateral ventricles. Associated mass effect with effacement of the LEFT lateral ventricle and rightward midline shift measuring approximately 10 mm. Also suspect some degree of LEFT-sided uncal herniation. No evidence of tonsillar or transtentorial herniation. No intracranial hemorrhage. Recommend brain  MRI with contrast for further characterization. These results and recommendations were called by telephone at the time of interpretation on 04/25/2018 at 11:30 pm to Dr. Geanie Kenning , who verbally acknowledged these results. Electronically Signed   By: Franki Cabot M.D.   On: 04/25/2018 23:33   Ct Chest W Contrast  Result Date: 04/26/2018 CLINICAL DATA:  Nausea vomiting with body ache EXAM: CT CHEST, ABDOMEN, AND PELVIS WITH CONTRAST TECHNIQUE: Multidetector CT imaging of the chest, abdomen and pelvis was performed following the standard protocol during bolus administration of intravenous contrast. CONTRAST:  178m ISOVUE-300 IOPAMIDOL (ISOVUE-300) INJECTION 61% COMPARISON:  Chest x-ray 04/25/2018 FINDINGS: CT CHEST FINDINGS Cardiovascular: Nonaneurysmal aorta. No pericardial effusion. Normal heart size. Mediastinum/Nodes: Subcentimeter hypodensity right lobe of thyroid. Midline trachea. 9 mm lymph node adjacent to the anterior arch. Esophagus within normal limits. Lungs/Pleura: No pleural effusion or pneumothorax. 5.6 x 3.9 cm heterogenous mass within the lingula. Musculoskeletal: No acute or suspicious abnormality. CT ABDOMEN PELVIS FINDINGS Hepatobiliary: No focal liver abnormality is seen. No gallstones, gallbladder wall thickening, or biliary dilatation. Pancreas: Unremarkable. No pancreatic ductal dilatation or surrounding inflammatory changes. Spleen: Normal in size without focal abnormality. Adrenals/Urinary Tract: Adrenal glands are unremarkable. Kidneys are normal, without renal calculi, focal lesion, or hydronephrosis. Bladder is unremarkable. Stomach/Bowel: Stomach is within normal limits. Appendix appears normal. No evidence of bowel wall thickening, distention, or inflammatory changes. Vascular/Lymphatic: Nonaneurysmal aorta.  No significant adenopathy. Reproductive: Uterus and bilateral adnexa are unremarkable. Other: Small free fluid in the pelvis.  No free air. Musculoskeletal: No acute or  significant osseous findings. IMPRESSION: 1. 5.6 x 3.9 cm heterogenous solid enhancing mass in the left lower lung. Mass is not separable from the pericardium and appears to displace surrounding bronchi and left pulmonary fissure, suggesting that this represents a pericardial mass as opposed to an intraparenchymal lung mass. 2. Few prominent lymph nodes adjacent to the aortic arch. 3. No CT evidence for acute intra-abdominal or pelvic abnormality. Small amount of free fluid in the pelvis. Electronically Signed   By: KDonavan FoilM.D.   On: 04/26/2018 01:59   Ct Abdomen Pelvis W Contrast  Result Date: 04/26/2018 CLINICAL DATA:  Nausea vomiting with body ache EXAM: CT CHEST, ABDOMEN, AND PELVIS WITH CONTRAST TECHNIQUE: Multidetector CT imaging of the chest, abdomen and pelvis was performed following the standard protocol during bolus administration of intravenous contrast. CONTRAST:  1053mISOVUE-300 IOPAMIDOL (ISOVUE-300) INJECTION 61% COMPARISON:  Chest x-ray 04/25/2018 FINDINGS: CT CHEST FINDINGS Cardiovascular: Nonaneurysmal aorta. No pericardial effusion. Normal heart size. Mediastinum/Nodes: Subcentimeter hypodensity right lobe of thyroid. Midline trachea. 9 mm lymph node adjacent to the anterior arch. Esophagus within normal limits. Lungs/Pleura: No pleural effusion or pneumothorax. 5.6 x 3.9 cm heterogenous mass within the lingula. Musculoskeletal: No acute or suspicious abnormality. CT ABDOMEN PELVIS FINDINGS Hepatobiliary:  No focal liver abnormality is seen. No gallstones, gallbladder wall thickening, or biliary dilatation. Pancreas: Unremarkable. No pancreatic ductal dilatation or surrounding inflammatory changes. Spleen: Normal in size without focal abnormality. Adrenals/Urinary Tract: Adrenal glands are unremarkable. Kidneys are normal, without renal calculi, focal lesion, or hydronephrosis. Bladder is unremarkable. Stomach/Bowel: Stomach is within normal limits. Appendix appears normal. No evidence  of bowel wall thickening, distention, or inflammatory changes. Vascular/Lymphatic: Nonaneurysmal aorta.  No significant adenopathy. Reproductive: Uterus and bilateral adnexa are unremarkable. Other: Small free fluid in the pelvis.  No free air. Musculoskeletal: No acute or significant osseous findings. IMPRESSION: 1. 5.6 x 3.9 cm heterogenous solid enhancing mass in the left lower lung. Mass is not separable from the pericardium and appears to displace surrounding bronchi and left pulmonary fissure, suggesting that this represents a pericardial mass as opposed to an intraparenchymal lung mass. 2. Few prominent lymph nodes adjacent to the aortic arch. 3. No CT evidence for acute intra-abdominal or pelvic abnormality. Small amount of free fluid in the pelvis. Electronically Signed   By: Donavan Foil M.D.   On: 04/26/2018 01:59    Pertinent items noted in HPI and remainder of comprehensive ROS otherwise negative. Blood pressure 117/67, pulse (!) 55, temperature (!) 97.4 F (36.3 C), temperature source Oral, resp. rate 16, height 5' 9"  (1.753 m), weight 64.4 kg (142 lb), last menstrual period 04/18/2018, SpO2 99 %, unknown if currently breastfeeding. Patient is awake and alert.  She is mildly confused.  She is oriented to person place time and situation.  Speech is fluent.  Judgment and insight are fair.  Cranial nerve function normal bilateral.  Motor examination with a slight right-sided pronator drift otherwise motor examination intact.  Sensory examination nonfocal.  Examination head ears eyes and throat were unremarkable her chest and abdomen are benign.  Extremities are free from injury deformity.  Assessment/Plan: Left temporal mass with surrounding edema.  MRI scan pending.  Patient also with a left chest wall mass.  Situation worrisome for metastatic disease although this could be an unusual presentation of abscess.  I will see her back after the MRI scan has been performed and make a more definitive  plan with regard to further treatment.  Continue IV steroids.  Mallie Mussel A Taeveon Keesling 04/26/2018, 8:15 AM

## 2018-04-26 NOTE — Progress Notes (Signed)
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  Plan for CT-guided biopsy of the chest lesion tomorrow.  Depending on results of biopsy tentatively thinking of surgical resection on Friday.

## 2018-04-26 NOTE — ED Notes (Signed)
[facility] here for transport.

## 2018-04-26 NOTE — Progress Notes (Addendum)
Patient seen and examined, admitted by Dr. Alcario Drought this morning.  Briefly 42 year old female with no past medical history presented with increasing anxiety, confusion, headaches, nausea vomiting for last 1 week. CT head showed left temporal mass with vasogenic edema, extending into the left frontoparietal lobe. BP 117/67 (BP Location: Right Arm)   Pulse (!) 55   Temp (!) 97.4 F (36.3 C) (Oral)   Resp 16   Ht 5' 9"  (1.753 m)   Wt 64.4 kg (142 lb)   LMP 04/18/2018   A/P  Left temporal brain mass with vasogenic edema, lung mass - Started on IV Decadron, IV fluid hydration -CT chest showed 5.6X 3.9 cm solid enhancing mass in the left lower lung, not separable from the pericardium and displacing surrounding bronchi and left pulmonary fissure suggesting a pericardial mass as opposed to intraparenchymal lung mass -Neurosurgery consulted, follow MRI of the brain -possibly will need CT surgery for biopsy, called to [facility], spoke with Thurmond Butts, [place].  Also spoke with pulmonology on call, mass is too deep for bronchoscopy, will likely need mediastinoscopy and CT surgery.   Estill Cotta M.D. [facility] 04/26/2018, 10:11 AM  Pager: [phone]

## 2018-04-26 NOTE — Consult Note (Addendum)
[address]       [place] [address]             [phone]      Reason for Consult: Left lung mass Referring Physician: Earnie Larsson MD  Leslie [name] is an 42 y.o. female.  HPI: The patient is a 42 year old female who  presented to the emergency department today with altered mental status.  She had some difficulty giving history as well.  She has a history significant for migraine headaches but otherwise is unremarkable.  Her friend reports that she has not  been acting like herself for approximately 3 days.  Reportedly she has not been eating or drinking well,  and has also had some vomiting.  CT scan revealed a temporal lobe brain mass with surrounding vasogenic edema.  Additionally she was found to have a mass on chest x-ray in the left lung.  She was admitted for further management to include neurosurgical and cardiothoracic surgical consultations.  Past Medical History:  Diagnosis Date  . Migraines   . Yeast infection     History reviewed. No pertinent surgical history.  Family History  Problem Relation Age of Onset  . Cancer Father        lung  Father is a long term smoker, has been treated for lung ca, but patient does not know any details, she has two sisters and one brother all health, two children 62 and 28.  Social History:  reports that she has never smoked. She has never used smokeless tobacco. She reports that she drinks alcohol. She reports that she does not use drugs.  Allergies: No Known Allergies  Medications: I have reviewed the patient's current medications. Scheduled Meds: . dexamethasone  4 mg Intravenous Q6H  . enoxaparin (LOVENOX) injection  40 mg Subcutaneous Daily  . feeding supplement (ENSURE ENLIVE)  237 mL Oral BID BM   Continuous Infusions: . sodium chloride 75 mL/hr at 04/26/18 0840   PRN Meds:.acetaminophen **OR** acetaminophen, ondansetron **OR** ondansetron (ZOFRAN) IV Results for orders placed or performed  during the hospital encounter of 04/25/18 (from the past 48 hour(s))  Lipase, blood     Status: None   Collection Time: 04/25/18  3:32 PM  Result Value Ref Range   Lipase 31 11 - 51 U/L    Comment: Performed at [facility], [place] [address] [place], [place] [address]  Comprehensive metabolic panel     Status: Abnormal   Collection Time: 04/25/18  3:32 PM  Result Value Ref Range   Sodium 138 135 - 145 mmol/L   Potassium 4.3 3.5 - 5.1 mmol/L   Chloride 100 (L) 101 - 111 mmol/L   CO2 25 22 - 32 mmol/L   Glucose, Bld 102 (H) 65 - 99 mg/dL   BUN 17 6 - 20 mg/dL   Creatinine, Ser 0.90 0.44 - 1.00 mg/dL   Calcium 9.3 8.9 - 10.3 mg/dL   Total Protein 7.6 6.5 - 8.1 g/dL   Albumin 3.9 3.5 - 5.0 g/dL   AST 20 15 - 41 U/L   ALT 17 14 - 54 U/L   Alkaline Phosphatase 93 38 - 126 U/L   Total Bilirubin 0.3 0.3 - 1.2 mg/dL   GFR calc non Af Amer >60 >60 mL/min   GFR calc Af Amer >60 >60 mL/min    Comment: (NOTE) The eGFR has been calculated using the CKD EPI equation. This calculation has not been validated in all  clinical situations. eGFR's persistently <60 mL/min signify possible Chronic Kidney Disease.    Anion gap 13 5 - 15    Comment: Performed at [facility], [place] [address], [place], [place] [address]  CBC     Status: Abnormal   Collection Time: 04/25/18  3:32 PM  Result Value Ref Range   WBC 13.5 (H) 4.0 - 10.5 K/uL   RBC 4.73 3.87 - 5.11 MIL/uL   Hemoglobin 14.2 12.0 - 15.0 g/dL   HCT 43.3 36.0 - 46.0 %   MCV 91.5 78.0 - 100.0 fL   MCH 30.0 26.0 - 34.0 pg   MCHC 32.8 30.0 - 36.0 g/dL   RDW 12.4 11.5 - 15.5 %   Platelets 448 (H) 150 - 400 K/uL    Comment: Performed at [facility], [place] [address], [place], [place] [address]  I-Stat beta hCG blood, ED     Status: None   Collection Time: 04/25/18  3:47 PM  Result Value Ref Range   I-stat hCG, quantitative <5.0 <5 mIU/mL   Comment 3            Comment:   GEST. AGE       CONC.  (mIU/mL)   <=1 WEEK        5 - 50     2 WEEKS       50 - 500     3 WEEKS       100 - 10,000     4 WEEKS     1,000 - 30,000        FEMALE AND NON-PREGNANT FEMALE:     LESS THAN 5 mIU/mL   Ethanol     Status: None   Collection Time: 04/25/18 11:08 PM  Result Value Ref Range   Alcohol, Ethyl (B) <10 <10 mg/dL    Comment:        LOWEST DETECTABLE LIMIT FOR SERUM ALCOHOL IS 10 mg/dL FOR MEDICAL PURPOSES ONLY Performed at [place] [address], [place], [place] [address]   Acetaminophen level     Status: Abnormal   Collection Time: 04/25/18 11:08 PM  Result Value Ref Range   Acetaminophen (Tylenol), Serum <10 (L) 10 - 30 ug/mL    Comment:        THERAPEUTIC CONCENTRATIONS VARY SIGNIFICANTLY. A RANGE OF 10-30 ug/mL MAY BE AN EFFECTIVE CONCENTRATION FOR MANY PATIENTS. HOWEVER, SOME ARE BEST TREATED AT CONCENTRATIONS OUTSIDE THIS RANGE. ACETAMINOPHEN CONCENTRATIONS >150 ug/mL AT 4 HOURS AFTER INGESTION AND >50 ug/mL AT 12 HOURS AFTER INGESTION ARE OFTEN ASSOCIATED WITH TOXIC REACTIONS. Performed at [facility], [place] [address], [place], [place] [address]   Salicylate level     Status: None   Collection Time: 04/25/18 11:08 PM  Result Value Ref Range   Salicylate Lvl <0.3 2.8 - 30.0 mg/dL    Comment: Performed at [facility], [place] [address], [place], [place] [address]  CK     Status: None   Collection Time: 04/25/18 11:08 PM  Result Value Ref Range   Total CK 67 38 - 234 U/L    Comment: Performed at [facility], [place] [address] [place], [place] [address]  Rapid HIV screen (HIV 1/2 Ab+Ag)     Status: None   Collection Time: 04/25/18 11:08 PM  Result Value Ref Range   HIV-1 P24 Antigen - HIV24 NON REACTIVE NON REACTIVE   HIV 1/2 Antibodies NON REACTIVE NON REACTIVE  Interpretation (HIV Ag Ab)      A non reactive test result means that HIV 1 or HIV 2 antibodies and HIV 1 p24 antigen  were not detected in the specimen.    Comment: RESULT CALLED TO, READ BACK BY AND VERIFIED WITHJudie Grieve RN [id number] 04/26/18 A NAVARRO Performed at [facility], [place] [address] [place], [place] [address]   Sedimentation rate     Status: Abnormal   Collection Time: 04/25/18 11:08 PM  Result Value Ref Range   Sed Rate 79 (H) 0 - 22 mm/hr    Comment: Performed at [facility], [place] [address] [place], [place] [address]  Sedimentation rate     Status: Abnormal   Collection Time: 04/25/18 11:08 PM  Result Value Ref Range   Sed Rate 30 (H) 0 - 22 mm/hr    Comment: Performed at [facility], [place] [address], [place], [place] [address]  Urinalysis, Routine w reflex microscopic     Status: Abnormal   Collection Time: 04/26/18  3:25 AM  Result Value Ref Range   Color, Urine STRAW (A) YELLOW   APPearance CLEAR CLEAR   Specific Gravity, Urine 1.028 1.005 - 1.030   pH 6.0 5.0 - 8.0   Glucose, UA NEGATIVE NEGATIVE mg/dL   Hgb urine dipstick NEGATIVE NEGATIVE   Bilirubin Urine NEGATIVE NEGATIVE   Ketones, ur 5 (A) NEGATIVE mg/dL   Protein, ur NEGATIVE NEGATIVE mg/dL   Nitrite NEGATIVE NEGATIVE   Leukocytes, UA NEGATIVE NEGATIVE    Comment: Performed at [place] [address], [place], [place] [address]  Rapid urine drug screen (hospital performed)     Status: None   Collection Time: 04/26/18  3:25 AM  Result Value Ref Range   Opiates NONE DETECTED NONE DETECTED   Cocaine NONE DETECTED NONE DETECTED   Benzodiazepines NONE DETECTED NONE DETECTED   Amphetamines NONE DETECTED NONE DETECTED   Tetrahydrocannabinol NONE DETECTED NONE DETECTED   Barbiturates NONE DETECTED NONE DETECTED    Comment: (NOTE) DRUG SCREEN FOR MEDICAL PURPOSES ONLY.  IF CONFIRMATION IS NEEDED FOR ANY PURPOSE, NOTIFY LAB WITHIN 5 DAYS. LOWEST DETECTABLE LIMITS FOR URINE DRUG SCREEN Drug Class                     Cutoff  (ng/mL) Amphetamine and metabolites    1000 Barbiturate and metabolites    200 Benzodiazepine                 270 Tricyclics and metabolites     300 Opiates and metabolites        300 Cocaine and metabolites        300 THC                            50 Performed at [facility], [place] [address] [place], [place] [address]    Dg Chest 2 View  Result Date: 04/25/2018 CLINICAL DATA:  Vomiting, body aches and headache. EXAM: CHEST - 2 VIEW COMPARISON:  None. FINDINGS: Heart size and mediastinal contours are within normal limits. Rounded mass overlies the LEFT lower lung, measuring 5 cm greatest dimension, most likely LEFT lower lobe as there is some associated obscuration of the LEFT heart border, less discretely seen on the lateral view but likely in the lingula based on the lateral view. RIGHT lung is clear. No pleural effusion or pneumothorax seen.  Osseous structures about the chest are unremarkable. IMPRESSION: Rounded mass overlying the LEFT lung base, measuring 5 cm greatest dimension, most likely within the lingula, less likely within the breast or other site outside the chest. Favor primary lung cancer. Recommend chest CT with contrast for further characterization. These results were called by telephone at the time of interpretation on 04/25/2018 at 11:38 pm to Dr. Shirlyn Goltz , who verbally acknowledged these results. Electronically Signed   By: Franki Cabot M.D.   On: 04/25/2018 23:39   Ct Head Wo Contrast  Result Date: 04/25/2018 CLINICAL DATA:  Vomiting all week in, body aches and headache. EXAM: CT HEAD WITHOUT CONTRAST TECHNIQUE: Contiguous axial images were obtained from the base of the skull through the vertex without intravenous contrast. COMPARISON:  None. FINDINGS: Brain: Mass within the LEFT temporal lobe measures approximately 2.8 cm, with extensive surrounding vasogenic edema which extends upwards into the LEFT frontoparietal lobe above the level of the LEFT  lateral ventricle. There is associated mass effect with effacement of the LEFT lateral ventricle and a rightward midline shift which measures approximately 10 mm. No parenchymal hemorrhage or extra-axial hemorrhage identified. Vascular: No hyperdense vessel or unexpected calcification. Skull: Normal. Negative for fracture or focal lesion. Sinuses/Orbits: No acute finding. Other: None. IMPRESSION: Mass within the LEFT temporal lobe, almost certainly neoplastic, with associated large amount of vasogenic edema which extends upwards from the LEFT temporal lobe into the LEFT frontoparietal lobe at and just above the level of the lateral ventricles. Associated mass effect with effacement of the LEFT lateral ventricle and rightward midline shift measuring approximately 10 mm. Also suspect some degree of LEFT-sided uncal herniation. No evidence of tonsillar or transtentorial herniation. No intracranial hemorrhage. Recommend brain MRI with contrast for further characterization. These results and recommendations were called by telephone at the time of interpretation on 04/25/2018 at 11:30 pm to Dr. Geanie Kenning , who verbally acknowledged these results. Electronically Signed   By: Franki Cabot M.D.   On: 04/25/2018 23:33   Ct Chest W Contrast  Result Date: 04/26/2018 CLINICAL DATA:  Nausea vomiting with body ache EXAM: CT CHEST, ABDOMEN, AND PELVIS WITH CONTRAST TECHNIQUE: Multidetector CT imaging of the chest, abdomen and pelvis was performed following the standard protocol during bolus administration of intravenous contrast. CONTRAST:  165m ISOVUE-300 IOPAMIDOL (ISOVUE-300) INJECTION 61% COMPARISON:  Chest x-ray 04/25/2018 FINDINGS: CT CHEST FINDINGS Cardiovascular: Nonaneurysmal aorta. No pericardial effusion. Normal heart size. Mediastinum/Nodes: Subcentimeter hypodensity right lobe of thyroid. Midline trachea. 9 mm lymph node adjacent to the anterior arch. Esophagus within normal limits. Lungs/Pleura: No pleural  effusion or pneumothorax. 5.6 x 3.9 cm heterogenous mass within the lingula. Musculoskeletal: No acute or suspicious abnormality. CT ABDOMEN PELVIS FINDINGS Hepatobiliary: No focal liver abnormality is seen. No gallstones, gallbladder wall thickening, or biliary dilatation. Pancreas: Unremarkable. No pancreatic ductal dilatation or surrounding inflammatory changes. Spleen: Normal in size without focal abnormality. Adrenals/Urinary Tract: Adrenal glands are unremarkable. Kidneys are normal, without renal calculi, focal lesion, or hydronephrosis. Bladder is unremarkable. Stomach/Bowel: Stomach is within normal limits. Appendix appears normal. No evidence of bowel wall thickening, distention, or inflammatory changes. Vascular/Lymphatic: Nonaneurysmal aorta.  No significant adenopathy. Reproductive: Uterus and bilateral adnexa are unremarkable. Other: Small free fluid in the pelvis.  No free air. Musculoskeletal: No acute or significant osseous findings. IMPRESSION: 1. 5.6 x 3.9 cm heterogenous solid enhancing mass in the left lower lung. Mass is not separable from the pericardium and appears to displace surrounding bronchi and left pulmonary  fissure, suggesting that this represents a pericardial mass as opposed to an intraparenchymal lung mass. 2. Few prominent lymph nodes adjacent to the aortic arch. 3. No CT evidence for acute intra-abdominal or pelvic abnormality. Small amount of free fluid in the pelvis. Electronically Signed   By: Donavan Foil M.D.   On: 04/26/2018 01:59   Ct Abdomen Pelvis W Contrast  Result Date: 04/26/2018 CLINICAL DATA:  Nausea vomiting with body ache EXAM: CT CHEST, ABDOMEN, AND PELVIS WITH CONTRAST TECHNIQUE: Multidetector CT imaging of the chest, abdomen and pelvis was performed following the standard protocol during bolus administration of intravenous contrast. CONTRAST:  153m ISOVUE-300 IOPAMIDOL (ISOVUE-300) INJECTION 61% COMPARISON:  Chest x-ray 04/25/2018 FINDINGS: CT CHEST  FINDINGS Cardiovascular: Nonaneurysmal aorta. No pericardial effusion. Normal heart size. Mediastinum/Nodes: Subcentimeter hypodensity right lobe of thyroid. Midline trachea. 9 mm lymph node adjacent to the anterior arch. Esophagus within normal limits. Lungs/Pleura: No pleural effusion or pneumothorax. 5.6 x 3.9 cm heterogenous mass within the lingula. Musculoskeletal: No acute or suspicious abnormality. CT ABDOMEN PELVIS FINDINGS Hepatobiliary: No focal liver abnormality is seen. No gallstones, gallbladder wall thickening, or biliary dilatation. Pancreas: Unremarkable. No pancreatic ductal dilatation or surrounding inflammatory changes. Spleen: Normal in size without focal abnormality. Adrenals/Urinary Tract: Adrenal glands are unremarkable. Kidneys are normal, without renal calculi, focal lesion, or hydronephrosis. Bladder is unremarkable. Stomach/Bowel: Stomach is within normal limits. Appendix appears normal. No evidence of bowel wall thickening, distention, or inflammatory changes. Vascular/Lymphatic: Nonaneurysmal aorta.  No significant adenopathy. Reproductive: Uterus and bilateral adnexa are unremarkable. Other: Small free fluid in the pelvis.  No free air. Musculoskeletal: No acute or significant osseous findings. IMPRESSION: 1. 5.6 x 3.9 cm heterogenous solid enhancing mass in the left lower lung. Mass is not separable from the pericardium and appears to displace surrounding bronchi and left pulmonary fissure, suggesting that this represents a pericardial mass as opposed to an intraparenchymal lung mass. 2. Few prominent lymph nodes adjacent to the aortic arch. 3. No CT evidence for acute intra-abdominal or pelvic abnormality. Small amount of free fluid in the pelvis. Electronically Signed   By: KDonavan FoilM.D.   On: 04/26/2018 01:59   I have independently reviewed the above radiology studies  and reviewed the findings with the patient.  MRI- reviewed full report pending , suspect met but very  round as abscess could be.   Review of Systems  Constitutional: Positive for malaise/fatigue and weight loss. Negative for chills, diaphoresis and fever.  HENT: Negative for congestion, ear discharge, ear pain, hearing loss, nosebleeds, sinus pain, sore throat and tinnitus.   Eyes: Positive for blurred vision. Negative for double vision, photophobia, pain, discharge and redness.  Respiratory: Negative for cough, hemoptysis, sputum production, shortness of breath, wheezing and stridor.   Cardiovascular: Negative for chest pain, palpitations, orthopnea, claudication, leg swelling and PND.  Gastrointestinal: Positive for nausea and vomiting. Negative for abdominal pain, blood in stool, constipation, diarrhea, heartburn and melena.  Genitourinary: Negative.        Decreased voiding because wasn't drinking anything/much for 3 days  Musculoskeletal: Negative for back pain, falls, joint pain, myalgias and neck pain.  Skin: Negative.   Neurological: Positive for speech change. Negative for dizziness, tingling, tremors, sensory change, focal weakness, seizures, loss of consciousness, weakness and headaches.       + expressive aphasia  Endo/Heme/Allergies: Positive for polydipsia. Negative for environmental allergies. Does not bruise/bleed easily.  Psychiatric/Behavioral: Positive for memory loss. Negative for depression, hallucinations, substance abuse and suicidal ideas.  The patient has insomnia. The patient is not nervous/anxious.    Blood pressure 101/81, pulse 66, temperature 97.8 F (36.6 C), temperature source Oral, resp. rate 16, height _0  (1.753 m), weight 64.4 kg (142 lb), last menstrual period 04/18/2018, SpO2 100 %, unknown if currently breastfeeding. Physical Exam  Constitutional: She appears well-developed and well-nourished. No distress.  HENT:  Head: Normocephalic and atraumatic.  Mouth/Throat: Oropharynx is clear and moist. No oropharyngeal exudate.  Eyes: Pupils are equal, round,  and reactive to light. Conjunctivae and EOM are normal. Left eye exhibits no discharge. No scleral icterus.  Neck: No JVD present. No tracheal deviation present. No thyromegaly present.  Cardiovascular: Normal rate, regular rhythm and normal heart sounds. Exam reveals no gallop.  No murmur heard. Respiratory: Effort normal and breath sounds normal. No stridor. No respiratory distress. She has no wheezes. She has no rales. She exhibits no tenderness.  GI: She exhibits no distension and no mass. There is no tenderness. There is no rebound and no guarding.  Musculoskeletal: She exhibits no edema or tenderness.  Lymphadenopathy:    She has no cervical adenopathy.  Neurological: She is alert. No cranial nerve deficit. She exhibits normal muscle tone. Coordination normal.  Expressive aphasia, somewhat confused.  Skin: Skin is warm and dry. No rash noted. She is not diaphoretic. No erythema. No pallor.  Psychiatric: She has a normal mood and affect.    Assessment/Plan: Suspect stage 4 lung cancer - with Newly dx'dBrain and left lung masses- with mental status and aphasia changes.- no evidence of mediastinal adenopathy - recommend ct guided needle bx to get tissue dx. Discussed with patient and husband.  Discussed with Dr Trenton Gammon - to consider resection of brain mass poss radiation prior to surgery. Will need PET scan- obtain as outpatient    Grace Isaac MD      [address] [place] [address] Office [phone]   [place]

## 2018-04-26 NOTE — Care Management Note (Signed)
Case Management Note  Patient Details  Name: Leslie [name] MRN: [id number] Date of Birth: February 07, 1976  Subjective/Objective:    Pt admitted with metastatic cancer to the brain. She is from home with her spouse.               Action/Plan: Awaiting MRI to see about potential surgery. CM following for d/c needs, physician orders.   Expected Discharge Date:                  Expected Discharge Plan:     In-House Referral:     Discharge planning Services     Post Acute Care Choice:    Choice offered to:     DME Arranged:    DME Agency:     HH Arranged:    HH Agency:     Status of Service:  In process, will continue to follow  If discussed at Long Length of Stay Meetings, dates discussed:    Additional Comments:  Pollie Friar, RN 04/26/2018, 11:30 AM

## 2018-04-26 NOTE — H&P (Addendum)
History and Physical    Leslie [name] JFH:[id number] DOB: 1975/12/18 DOA: 04/25/2018  PCP: System, Pcp Not In  Patient coming from: Home  I have personally briefly reviewed patient's old medical records in [place]  Chief Complaint: AMS, vomiting  HPI: Leslie [name] is a 42 y.o. female with medical history significant of migraines who presents to the emergency department for evaluation of altered mental status. Patient initially having trouble giving history according to EDP notes.  She speaks in partial sentences, does not elaborate.  A friend brought her in who states that she has been acting unlike herself for the past three days. She lives at home with her two children, but they were away at friend's houses and she was mostly alone.   She told him that she had vomited but he had not seen her do this. He reports that she has barely had anything to eat or drink.  ED Course: Found to have Temporal lobe brain mass with surrounding vasogenic edema.  Also has lung mass on CXR.  Dr. Trenton Gammon consulted CT chest / abd / pelvis pending.  Patient put on decadron.   Review of Systems: As per HPI otherwise 10 point review of systems negative.   Past Medical History:  Diagnosis Date  . Migraines   . Yeast infection     History reviewed. No pertinent surgical history.   reports that she has never smoked. She has never used smokeless tobacco. She reports that she drinks alcohol. She reports that she does not use drugs.  No Known Allergies  Family History  Problem Relation Age of Onset  . Cancer Father        lung     Prior to Admission medications   Not on File    Physical Exam: Vitals:   04/25/18 2011 04/25/18 2252 04/26/18 0004 04/26/18 0030  BP: (!) 141/95 124/81 127/86 115/71  Pulse: 70 (!) 58 (!) 56 (!) 53  Resp: 16 14 12 14   Temp: 98.9 F (37.2 C) 98.2 F (36.8 C)    TempSrc: Oral Oral    SpO2: 100% 100% 100% 98%  Weight:   64.4 kg (142 lb)     Height:   5' 9"  (1.753 m)     Constitutional: NAD, calm, comfortable Eyes: PERRL, lids and conjunctivae normal ENMT: Mucous membranes are moist. Posterior pharynx clear of any exudate or lesions.Normal dentition.  Neck: normal, supple, no masses, no thyromegaly Respiratory: clear to auscultation bilaterally, no wheezing, no crackles. Normal respiratory effort. No accessory muscle use.  Cardiovascular: Regular rate and rhythm, no murmurs / rubs / gallops. No extremity edema. 2+ pedal pulses. No carotid bruits.  Abdomen: no tenderness, no masses palpated. No hepatosplenomegaly. Bowel sounds positive.  Musculoskeletal: no clubbing / cyanosis. No joint deformity upper and lower extremities. Good ROM, no contractures. Normal muscle tone.  Skin: no rashes, lesions, ulcers. No induration Neurologic: Speaking in partial sentences. Psychiatric: Normal judgment and insight. Alert and oriented x 3. Normal mood.    Labs on Admission: I have personally reviewed following labs and imaging studies  CBC: Recent Labs  Lab 04/25/18 1532  WBC 13.5*  HGB 14.2  HCT 43.3  MCV 91.5  PLT 937*   Basic Metabolic Panel: Recent Labs  Lab 04/25/18 1532  NA 138  K 4.3  CL 100*  CO2 25  GLUCOSE 102*  BUN 17  CREATININE 0.90  CALCIUM 9.3   GFR: Estimated Creatinine Clearance: 82.8 mL/min (by C-G formula based on SCr  of 0.9 mg/dL). Liver Function Tests: Recent Labs  Lab 04/25/18 1532  AST 20  ALT 17  ALKPHOS 93  BILITOT 0.3  PROT 7.6  ALBUMIN 3.9   Recent Labs  Lab 04/25/18 1532  LIPASE 31   No results for input(s): AMMONIA in the last 168 hours. Coagulation Profile: No results for input(s): INR, PROTIME in the last 168 hours. Cardiac Enzymes: Recent Labs  Lab 04/25/18 2308  CKTOTAL 67   BNP (last 3 results) No results for input(s): PROBNP in the last 8760 hours. HbA1C: No results for input(s): HGBA1C in the last 72 hours. CBG: No results for input(s): GLUCAP in the last 168  hours. Lipid Profile: No results for input(s): CHOL, HDL, LDLCALC, TRIG, CHOLHDL, LDLDIRECT in the last 72 hours. Thyroid Function Tests: No results for input(s): TSH, T4TOTAL, FREET4, T3FREE, THYROIDAB in the last 72 hours. Anemia Panel: No results for input(s): VITAMINB12, FOLATE, FERRITIN, TIBC, IRON, RETICCTPCT in the last 72 hours. Urine analysis: No results found for: COLORURINE, APPEARANCEUR, LABSPEC, PHURINE, GLUCOSEU, HGBUR, BILIRUBINUR, KETONESUR, PROTEINUR, UROBILINOGEN, NITRITE, LEUKOCYTESUR  Radiological Exams on Admission: Dg Chest 2 View  Result Date: 04/25/2018 CLINICAL DATA:  Vomiting, body aches and headache. EXAM: CHEST - 2 VIEW COMPARISON:  None. FINDINGS: Heart size and mediastinal contours are within normal limits. Rounded mass overlies the LEFT lower lung, measuring 5 cm greatest dimension, most likely LEFT lower lobe as there is some associated obscuration of the LEFT heart border, less discretely seen on the lateral view but likely in the lingula based on the lateral view. RIGHT lung is clear. No pleural effusion or pneumothorax seen. Osseous structures about the chest are unremarkable. IMPRESSION: Rounded mass overlying the LEFT lung base, measuring 5 cm greatest dimension, most likely within the lingula, less likely within the breast or other site outside the chest. Favor primary lung cancer. Recommend chest CT with contrast for further characterization. These results were called by telephone at the time of interpretation on 04/25/2018 at 11:38 pm to Dr. Shirlyn Goltz , who verbally acknowledged these results. Electronically Signed   By: Franki Cabot M.D.   On: 04/25/2018 23:39   Ct Head Wo Contrast  Result Date: 04/25/2018 CLINICAL DATA:  Vomiting all week in, body aches and headache. EXAM: CT HEAD WITHOUT CONTRAST TECHNIQUE: Contiguous axial images were obtained from the base of the skull through the vertex without intravenous contrast. COMPARISON:  None. FINDINGS: Brain:  Mass within the LEFT temporal lobe measures approximately 2.8 cm, with extensive surrounding vasogenic edema which extends upwards into the LEFT frontoparietal lobe above the level of the LEFT lateral ventricle. There is associated mass effect with effacement of the LEFT lateral ventricle and a rightward midline shift which measures approximately 10 mm. No parenchymal hemorrhage or extra-axial hemorrhage identified. Vascular: No hyperdense vessel or unexpected calcification. Skull: Normal. Negative for fracture or focal lesion. Sinuses/Orbits: No acute finding. Other: None. IMPRESSION: Mass within the LEFT temporal lobe, almost certainly neoplastic, with associated large amount of vasogenic edema which extends upwards from the LEFT temporal lobe into the LEFT frontoparietal lobe at and just above the level of the lateral ventricles. Associated mass effect with effacement of the LEFT lateral ventricle and rightward midline shift measuring approximately 10 mm. Also suspect some degree of LEFT-sided uncal herniation. No evidence of tonsillar or transtentorial herniation. No intracranial hemorrhage. Recommend brain MRI with contrast for further characterization. These results and recommendations were called by telephone at the time of interpretation on 04/25/2018 at 11:30 pm  to Dr. Geanie Kenning , who verbally acknowledged these results. Electronically Signed   By: Franki Cabot M.D.   On: 04/25/2018 23:33    EKG: Independently reviewed.  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 Brain mass with vasogenic edema - 1. Also has lung mass which is favored to be primary at the moment. 2. Decadron 1. Already having noticeable mental status improvement per friend who is at bedside. 2. Patient with GCS 15 at this time, talking in broken sentences but is definitely talking, MAE, (not what I would expect from an active herniation). 3. Dr. Trenton Gammon called by EDP 1. to see in consult in  AM 2. No seizure ppx for now, she hasnt had any seizure activity. 3. Put on decadron 4. Per EDP he did see the CT head and the report which was back at time of consult. 4. CT chest / abd / pelvis read pending  DVT prophylaxis: Lovenox Code Status: Full Family Communication: Friend at bedside Disposition Plan: Home after admit Consults called: Dr. Trenton Gammon with NS Admission status: Admit to inpatient   [place], [place] [facility] Pager [phone]  If 7AM-7PM, please contact day team taking care of patient [email or web address] Password [email or web address]  04/26/2018, 1:40 AM

## 2018-04-26 NOTE — ED Notes (Signed)
[facility] dispatch notified for need of transport.  

## 2018-04-26 NOTE — Progress Notes (Signed)
Initial Nutrition Assessment  DOCUMENTATION CODES:   Not applicable  INTERVENTION:  Monitor for needs  NUTRITION DIAGNOSIS:   Inadequate oral intake related to nausea, vomiting, chronic illness as evidenced by per patient/family report, percent weight loss  GOAL:   Patient will meet greater than or equal to 90% of their needs  MONITOR:   PO intake, Weight trends  REASON FOR ASSESSMENT:   Malnutrition Screening Tool    ASSESSMENT:   Leslie [name] is a 42 y.o. female with medical history significant of migraines who presents to the emergency department for evaluation of altered mental status. Presents with brain and lung mass, lung favored as primary.  Spoke with [name] at bedside, she demonstrated some confusion and expressive aphasia, but was able to answer some questions appropriately. 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 Over the past 2-3 days she has eaten very little andhas not been feeling like her normal self. She also reports weight loss from 147 pounds to 142 pounds a 5 pound/3.4% severe weight loss over 1 week.  Patient is vegan and gluten free. This RD will try to meet patient's calorie needs within dietary restrictions. She mentioned her boyfriend was planning to go to [employer] and bring her food, which this RD suggested was a good idea. She is used to very specific calorie/protein intake and is not interested in food provided from dining services. We will try to accommodate her.  Labs reviewed Medications reviewed and include:  NS at 34m/hr  NUTRITION - FOCUSED PHYSICAL EXAM:    Most Recent Value  Orbital Region  No depletion  Upper Arm Region  No depletion  Thoracic and Lumbar Region  No depletion  Buccal Region  No depletion  Temple Region  No depletion  Clavicle Bone Region  No depletion  Clavicle and Acromion Bone Region  No depletion  Scapular  Bone Region  No depletion  Dorsal Hand  No depletion  Patellar Region  No depletion  Anterior Thigh Region  No depletion  Posterior Calf Region  No depletion       Diet Order:   Diet Order           Diet NPO time specified Except for: Sips with Meds  Diet effective midnight        Diet vegetarian Room service appropriate? Yes; Fluid consistency: Thin  Diet effective now          EDUCATION NEEDS:   Not appropriate for education at this time  Skin:  Skin Assessment: Reviewed RN Assessment  Last BM:  04/25/2018  Height:   Ht Readings from Last 1 Encounters:  04/26/18 5' 9"  (1.753 m)    Weight:   Wt Readings from Last 1 Encounters:  04/26/18 142 lb (64.4 kg)    Ideal Body Weight:  65.9 kg  BMI:  Body mass index is 20.97 kg/m.  Estimated Nutritional Needs:   Kcal:  [address]-5573calories (30-35 cal/kg)  Protein:  84-110 grams (1.3-1.7g/kg)  Fluid:  >2L  WSatira [name] Leslie Filippone, MS, RD LDN Inpatient Clinical Dietitian Pager [phone]

## 2018-04-26 NOTE — ED Notes (Signed)
ED TO INPATIENT HANDOFF REPORT  Name/Age/Gender Leslie [name] 42 y.o. female  Code Status    Code Status Orders  (From admission, onward)        Start     Ordered   04/26/18 0103  Full code  Continuous     04/26/18 0105    Code Status History    This patient has a current code status but no historical code status.      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 Migraines   . Yeast infection     Allergies No Known Allergies  IV Location/Drains/Wounds Patient Lines/Drains/Airways Status   Active Line/Drains/Airways    Name:   Placement date:   Placement time:   Site:   Days:   Peripheral IV 04/25/18 Left Arm   04/25/18    2303    Arm   1   Peripheral IV 04/26/18 Left Hand   04/26/18    0225    Hand   less than 1          Labs/Imaging Results for orders placed or performed during the hospital encounter of 04/25/18 (from the past 48 hour(s))  Lipase, blood     Status: None   Collection Time: 04/25/18  3:32 PM  Result Value Ref Range   Lipase 31 11 - 51 U/L    Comment: Performed at [facility], [place] [address] [place], [place] [address]  Comprehensive metabolic panel     Status: Abnormal   Collection Time: 04/25/18  3:32 PM  Result Value Ref Range   Sodium 138 135 - 145 mmol/L   Potassium 4.3 3.5 - 5.1  mmol/L   Chloride 100 (L) 101 - 111 mmol/L   CO2 25 22 - 32 mmol/L   Glucose, Bld 102 (H) 65 - 99 mg/dL   BUN 17 6 - 20 mg/dL   Creatinine, Ser 0.90 0.44 - 1.00 mg/dL   Calcium 9.3 8.9 - 10.3 mg/dL   Total Protein 7.6 6.5 - 8.1 g/dL   Albumin 3.9 3.5 - 5.0 g/dL   AST 20 15 - 41 U/L   ALT 17 14 - 54 U/L   Alkaline Phosphatase 93 38 - 126 U/L   Total Bilirubin 0.3 0.3 - 1.2 mg/dL   GFR calc non Af Amer >60 >60 mL/min   GFR calc Af Amer >60 >60 mL/min    Comment: (NOTE) The eGFR has been calculated using the CKD EPI equation. This calculation has not been validated in all clinical situations. eGFR's persistently <60 mL/min signify possible Chronic Kidney Disease.    Anion gap 13 5 - 15    Comment: Performed at [facility], [place] [address] [place], [place] [address]  CBC     Status: Abnormal   Collection Time: 04/25/18  3:32 PM  Result Value Ref Range  WBC 13.5 (H) 4.0 - 10.5 K/uL   RBC 4.73 3.87 - 5.11 MIL/uL   Hemoglobin 14.2 12.0 - 15.0 g/dL   HCT 43.3 36.0 - 46.0 %   MCV 91.5 78.0 - 100.0 fL   MCH 30.0 26.0 - 34.0 pg   MCHC 32.8 30.0 - 36.0 g/dL   RDW 12.4 11.5 - 15.5 %   Platelets 448 (H) 150 - 400 K/uL    Comment: Performed at [facility], [place] [address] [place], [place] [address]  I-Stat beta hCG blood, ED     Status: None   Collection Time: 04/25/18  3:47 PM  Result Value Ref Range   I-stat hCG, quantitative <5.0 <5 mIU/mL   Comment 3            Comment:   GEST. AGE      CONC.  (mIU/mL)   <=1 WEEK        5 - 50     2 WEEKS       50 - 500     3 WEEKS       100 - 10,000     4 WEEKS     1,000 - 30,000        FEMALE AND NON-PREGNANT FEMALE:     LESS THAN 5 mIU/mL   Ethanol     Status: None   Collection Time: 04/25/18 11:08 PM  Result Value Ref Range   Alcohol, Ethyl (B) <10 <10 mg/dL    Comment:        LOWEST DETECTABLE LIMIT FOR SERUM ALCOHOL IS 10 mg/dL FOR MEDICAL PURPOSES ONLY Performed at [place] [address] [place], [place] [address]   Acetaminophen level     Status: Abnormal   Collection Time: 04/25/18 11:08 PM  Result Value Ref Range   Acetaminophen (Tylenol), Serum <10 (L) 10 - 30 ug/mL    Comment:        THERAPEUTIC CONCENTRATIONS VARY SIGNIFICANTLY. A RANGE OF 10-30 ug/mL MAY BE AN EFFECTIVE CONCENTRATION FOR MANY PATIENTS. HOWEVER, SOME ARE BEST TREATED AT CONCENTRATIONS OUTSIDE THIS RANGE. ACETAMINOPHEN CONCENTRATIONS >150 ug/mL AT 4 HOURS AFTER INGESTION AND >50 ug/mL AT 12 HOURS AFTER INGESTION ARE OFTEN ASSOCIATED WITH TOXIC REACTIONS. Performed at [facility], [place] [address], [place], [place] [address]   Salicylate level     Status: None   Collection Time: 04/25/18 11:08 PM  Result Value Ref Range   Salicylate Lvl <0.2 2.8 - 30.0 mg/dL    Comment: Performed at [facility], [place] [address], [place], [place] [address]  CK     Status: None   Collection Time: 04/25/18 11:08 PM  Result Value Ref Range   Total CK 67 38 - 234 U/L    Comment: Performed at [facility], [place] [address] [place], [place] [address]  Rapid HIV screen (HIV 1/2 Ab+Ag)     Status: None   Collection Time: 04/25/18 11:08 PM  Result Value Ref Range   HIV-1 P24 Antigen - HIV24 NON REACTIVE NON REACTIVE   HIV 1/2 Antibodies NON REACTIVE NON REACTIVE   Interpretation (HIV Ag Ab)      A non reactive test result means that HIV 1 or HIV 2 antibodies and HIV 1 p24 antigen were not detected in the specimen.    Comment: RESULT CALLED TO, READ BACK BY AND VERIFIED WITHJudie Grieve RN [id number] 04/26/18 A NAVARRO Performed at [facility],  [place] [address], [place], [place] [address]   Sedimentation rate     Status: Abnormal   Collection Time: 04/25/18 11:08 PM  Result Value Ref Range   Sed Rate 79 (H) 0 - 22 mm/hr    Comment: Performed at [facility], [place] [address] [place], [place] [address]   Sedimentation rate     Status: Abnormal   Collection Time: 04/25/18 11:08 PM  Result Value Ref Range   Sed Rate 30 (H) 0 - 22 mm/hr    Comment: Performed at [facility], [place] [address], [place], [place] [address]   Dg Chest 2 View  Result Date: 04/25/2018 CLINICAL DATA:  Vomiting, body aches and headache. EXAM: CHEST - 2 VIEW COMPARISON:  None. FINDINGS: Heart size and mediastinal contours are within normal limits. Rounded mass overlies the LEFT lower lung, measuring 5 cm greatest dimension, most likely LEFT lower lobe as there is some associated obscuration of the LEFT heart border, less discretely seen on the lateral view but likely in the lingula based on the lateral view. RIGHT lung is clear. No pleural effusion or pneumothorax seen. Osseous structures about the chest are unremarkable. IMPRESSION: Rounded mass overlying the LEFT lung base, measuring 5 cm greatest dimension, most likely within the lingula, less likely within the breast or other site outside the chest. Favor primary lung cancer. Recommend chest CT with contrast for further characterization. These results were called by telephone at the time of interpretation on 04/25/2018 at 11:38 pm to Dr. Shirlyn Goltz , who verbally acknowledged these results. Electronically Signed   By: Franki Cabot M.D.   On: 04/25/2018 23:39   Ct Head Wo Contrast  Result Date: 04/25/2018 CLINICAL DATA:  Vomiting all week in, body aches and headache. EXAM: CT HEAD WITHOUT CONTRAST TECHNIQUE: Contiguous axial images were obtained from the base of the skull through the vertex without intravenous contrast. COMPARISON:  None. FINDINGS: Brain: Mass within the LEFT temporal lobe measures approximately 2.8 cm, with extensive surrounding vasogenic edema which extends upwards into the LEFT frontoparietal lobe above the level of the LEFT lateral ventricle. There is associated mass effect with effacement of the LEFT lateral ventricle and a rightward midline  shift which measures approximately 10 mm. No parenchymal hemorrhage or extra-axial hemorrhage identified. Vascular: No hyperdense vessel or unexpected calcification. Skull: Normal. Negative for fracture or focal lesion. Sinuses/Orbits: No acute finding. Other: None. IMPRESSION: Mass within the LEFT temporal lobe, almost certainly neoplastic, with associated large amount of vasogenic edema which extends upwards from the LEFT temporal lobe into the LEFT frontoparietal lobe at and just above the level of the lateral ventricles. Associated mass effect with effacement of the LEFT lateral ventricle and rightward midline shift measuring approximately 10 mm. Also suspect some degree of LEFT-sided uncal herniation. No evidence of tonsillar or transtentorial herniation. No intracranial hemorrhage. Recommend brain MRI with contrast for further characterization. These results and recommendations were called by telephone at the time of interpretation on 04/25/2018 at 11:30 pm to Dr. Geanie Kenning , who verbally acknowledged these results. Electronically Signed   By: Franki Cabot M.D.   On: 04/25/2018 23:33   Ct Chest W Contrast  Result Date: 04/26/2018 CLINICAL DATA:  Nausea vomiting with body ache EXAM: CT CHEST, ABDOMEN, AND PELVIS WITH CONTRAST TECHNIQUE: Multidetector CT imaging of the chest, abdomen and pelvis was performed following the standard protocol during bolus administration of intravenous contrast. CONTRAST:  148m ISOVUE-300 IOPAMIDOL (ISOVUE-300) INJECTION 61% COMPARISON:  Chest  x-ray 04/25/2018 FINDINGS: CT CHEST FINDINGS Cardiovascular: Nonaneurysmal aorta. No pericardial effusion. Normal heart size. Mediastinum/Nodes: Subcentimeter hypodensity right lobe of thyroid. Midline trachea. 9 mm lymph node adjacent to the anterior arch. Esophagus within normal limits. Lungs/Pleura: No pleural effusion or pneumothorax. 5.6 x 3.9 cm heterogenous mass within the lingula. Musculoskeletal: No acute or suspicious  abnormality. CT ABDOMEN PELVIS FINDINGS Hepatobiliary: No focal liver abnormality is seen. No gallstones, gallbladder wall thickening, or biliary dilatation. Pancreas: Unremarkable. No pancreatic ductal dilatation or surrounding inflammatory changes. Spleen: Normal in size without focal abnormality. Adrenals/Urinary Tract: Adrenal glands are unremarkable. Kidneys are normal, without renal calculi, focal lesion, or hydronephrosis. Bladder is unremarkable. Stomach/Bowel: Stomach is within normal limits. Appendix appears normal. No evidence of bowel wall thickening, distention, or inflammatory changes. Vascular/Lymphatic: Nonaneurysmal aorta.  No significant adenopathy. Reproductive: Uterus and bilateral adnexa are unremarkable. Other: Small free fluid in the pelvis.  No free air. Musculoskeletal: No acute or significant osseous findings. IMPRESSION: 1. 5.6 x 3.9 cm heterogenous solid enhancing mass in the left lower lung. Mass is not separable from the pericardium and appears to displace surrounding bronchi and left pulmonary fissure, suggesting that this represents a pericardial mass as opposed to an intraparenchymal lung mass. 2. Few prominent lymph nodes adjacent to the aortic arch. 3. No CT evidence for acute intra-abdominal or pelvic abnormality. Small amount of free fluid in the pelvis. Electronically Signed   By: Donavan Foil M.D.   On: 04/26/2018 01:59   Ct Abdomen Pelvis W Contrast  Result Date: 04/26/2018 CLINICAL DATA:  Nausea vomiting with body ache EXAM: CT CHEST, ABDOMEN, AND PELVIS WITH CONTRAST TECHNIQUE: Multidetector CT imaging of the chest, abdomen and pelvis was performed following the standard protocol during bolus administration of intravenous contrast. CONTRAST:  185m ISOVUE-300 IOPAMIDOL (ISOVUE-300) INJECTION 61% COMPARISON:  Chest x-ray 04/25/2018 FINDINGS: CT CHEST FINDINGS Cardiovascular: Nonaneurysmal aorta. No pericardial effusion. Normal heart size. Mediastinum/Nodes: Subcentimeter  hypodensity right lobe of thyroid. Midline trachea. 9 mm lymph node adjacent to the anterior arch. Esophagus within normal limits. Lungs/Pleura: No pleural effusion or pneumothorax. 5.6 x 3.9 cm heterogenous mass within the lingula. Musculoskeletal: No acute or suspicious abnormality. CT ABDOMEN PELVIS FINDINGS Hepatobiliary: No focal liver abnormality is seen. No gallstones, gallbladder wall thickening, or biliary dilatation. Pancreas: Unremarkable. No pancreatic ductal dilatation or surrounding inflammatory changes. Spleen: Normal in size without focal abnormality. Adrenals/Urinary Tract: Adrenal glands are unremarkable. Kidneys are normal, without renal calculi, focal lesion, or hydronephrosis. Bladder is unremarkable. Stomach/Bowel: Stomach is within normal limits. Appendix appears normal. No evidence of bowel wall thickening, distention, or inflammatory changes. Vascular/Lymphatic: Nonaneurysmal aorta.  No significant adenopathy. Reproductive: Uterus and bilateral adnexa are unremarkable. Other: Small free fluid in the pelvis.  No free air. Musculoskeletal: No acute or significant osseous findings. IMPRESSION: 1. 5.6 x 3.9 cm heterogenous solid enhancing mass in the left lower lung. Mass is not separable from the pericardium and appears to displace surrounding bronchi and left pulmonary fissure, suggesting that this represents a pericardial mass as opposed to an intraparenchymal lung mass. 2. Few prominent lymph nodes adjacent to the aortic arch. 3. No CT evidence for acute intra-abdominal or pelvic abnormality. Small amount of free fluid in the pelvis. Electronically Signed   By: KDonavan FoilM.D.   On: 04/26/2018 01:59    Pending Labs Unresulted Labs (From admission, onward)   Start     Ordered   04/25/18 2234  RPR  STAT,   STAT     04/25/18  2233   04/25/18 2224  Rapid urine drug screen (hospital performed)  STAT,   R     04/25/18 2223   04/25/18 1505  Urinalysis, Routine w reflex microscopic   STAT,   STAT     04/25/18 1504      Vitals/Pain Today's Vitals   04/26/18 0030 04/26/18 0130 04/26/18 0200 04/26/18 0231  BP: 115/71 126/83 118/86 109/81  Pulse: (!) 53 (!) 57 (!) 53 (!) 58  Resp: 14 15 14 15   Temp:    98.4 F (36.9 C)  TempSrc:    Oral  SpO2: 98% 99% 98% 97%  Weight:      Height:      PainSc:        Isolation Precautions No active isolations  Medications Medications  iopamidol (ISOVUE-300) 61 % injection (has no administration in time range)  dexamethasone (DECADRON) injection 4 mg (has no administration in time range)  acetaminophen (TYLENOL) tablet 650 mg (has no administration in time range)    Or  acetaminophen (TYLENOL) suppository 650 mg (has no administration in time range)  ondansetron (ZOFRAN) tablet 4 mg (has no administration in time range)    Or  ondansetron (ZOFRAN) injection 4 mg (has no administration in time range)  enoxaparin (LOVENOX) injection 40 mg (has no administration in time range)  sodium chloride 0.9 % bolus 1,000 mL (0 mLs Intravenous Stopped 04/26/18 0002)  sodium chloride 0.9 % bolus 1,000 mL (1,000 mLs Intravenous New Bag/Given 04/26/18 0009)  dexamethasone (DECADRON) injection 10 mg (10 mg Intravenous Given 04/26/18 0006)  iopamidol (ISOVUE-300) 61 % injection 100 mL (100 mLs Intravenous Contrast Given 04/26/18 0052)    Mobility walks

## 2018-04-26 NOTE — Progress Notes (Signed)
Patient received alert and oriented with periods of confusion. Skin assessment done by this nurse and Larose Hires. On head to toe assessment no pressure ulcer noted.

## 2018-04-27 ENCOUNTER — Inpatient Hospital Stay (HOSPITAL_COMMUNITY): Payer: BLUE CROSS/BLUE SHIELD

## 2018-04-27 DIAGNOSIS — R918 Other nonspecific abnormal finding of lung field: Secondary | ICD-10-CM

## 2018-04-27 DIAGNOSIS — G936 Cerebral edema: Secondary | ICD-10-CM

## 2018-04-27 LAB — SYPHILIS: RPR W/REFLEX TO RPR TITER AND TREPONEMAL ANTIBODIES, TRADITIONAL SCREENING AND DIAGNOSIS ALGORITHM: RPR Ser Ql: NONREACTIVE

## 2018-04-27 LAB — BASIC METABOLIC PANEL
Anion gap: 8 (ref 5–15)
BUN: 13 mg/dL (ref 6–20)
CO2: 28 mmol/L (ref 22–32)
Calcium: 9 mg/dL (ref 8.9–10.3)
Chloride: 102 mmol/L (ref 101–111)
Creatinine, Ser: 0.87 mg/dL (ref 0.44–1.00)
GFR calc Af Amer: >60 mL/min (ref >60)
GFR calc non Af Amer: >60 mL/min (ref >60)
Glucose, Bld: 91 mg/dL (ref 65–99)
Potassium: 3.7 mmol/L (ref 3.5–5.1)
Sodium: 138 mmol/L (ref 135–145)

## 2018-04-27 LAB — CBC
HCT: 40.5 % (ref 36.0–46.0)
Hemoglobin: 13.2 g/dL (ref 12.0–15.0)
MCH: 29.7 pg (ref 26.0–34.0)
MCHC: 32.6 g/dL (ref 30.0–36.0)
MCV: 91.2 fL (ref 78.0–100.0)
Platelets: 379 10*3/uL (ref 150–400)
RBC: 4.44 MIL/uL (ref 3.87–5.11)
RDW: 12.4 % (ref 11.5–15.5)
WBC: 15.1 10*3/uL — ABNORMAL HIGH (ref 4.0–10.5)

## 2018-04-27 LAB — PROTIME-INR
INR: 0.98
Prothrombin Time: 12.9 seconds (ref 11.4–15.2)

## 2018-04-27 MED ORDER — LIDOCAINE HCL 1 % IJ SOLN
INTRAMUSCULAR | Status: AC
  Filled 2018-04-27: qty 20

## 2018-04-27 MED ORDER — FENTANYL CITRATE (PF) 100 MCG/2ML IJ SOLN
INTRAMUSCULAR | Status: AC
  Filled 2018-04-27: qty 2

## 2018-04-27 MED ORDER — FENTANYL CITRATE (PF) 100 MCG/2ML IJ SOLN
INTRAMUSCULAR | Status: AC | PRN
  Administered 2018-04-27 (×2): 25 ug via INTRAVENOUS

## 2018-04-27 MED ORDER — MIDAZOLAM HCL 2 MG/2ML IJ SOLN
INTRAMUSCULAR | Status: AC
  Filled 2018-04-27: qty 2

## 2018-04-27 MED ORDER — MIDAZOLAM HCL 2 MG/2ML IJ SOLN
INTRAMUSCULAR | Status: AC | PRN
  Administered 2018-04-27 (×2): 0.5 mg via INTRAVENOUS

## 2018-04-27 NOTE — Progress Notes (Signed)
PROGRESS NOTE    Leslie [name]  DGL:[id number] DOB: Jun 29, 1976 DOA: 04/25/2018 PCP: System, Pcp Not In   Brief Narrative: Leslie [name] is a 42 y.o. female with a history of migraine.  She presented with altered mental status and found to have a brain mass in addition to lung mass.  Concern for metastatic disease.  She has associated vasogenic edema with midline shift.  Neurosurgery consulted and is planning possible craniotomy pending results of biopsy of lung lesion.  Started on IV Decadron.   Assessment & Plan:   Active Problems:   Brain mass   Lung mass   Brain/lung mass Concern for metastatic disease.  Patient with associated intracranial edema with midline shift.  Neurosurgery and CT surgery consulted.  Plan for CT guided biopsy today with further plans for craniotomy tentative on preliminary biopsy results. -Await lung biopsy results -Neurosurgery recommendations: Tentative plan for craniotomy on 5/17  Vasogenic edema Secondary to brain mass. -Neurosurgery recommendations -IV Decadron  Nausea/vomiting Resolved.  Secondary to brain lesion. -Continue Zofran as needed   DVT prophylaxis: Lovenox Code Status:   Code Status: Full Code Family Communication: Friend at bedside Disposition Plan: Discharge pending neurosurgery recommendations   Consultants:   Neurosurgery  Cardiothoracic surgery  Interventional radiology  Procedures:   None  Antimicrobials:  None   Subjective: Patient is thirsty today.  Per friend, patient has been slightly confused overnight.  Objective: Vitals:   04/26/18 2131 04/27/18 0424 04/27/18 0743 04/27/18 1215  BP: 106/71 (!) 97/59 110/75 112/74  Pulse: (!) 57 (!) 54 (!) 51 (!) 56  Resp: 18 16 16 16   Temp:  98 F (36.7 C) 98.2 F (36.8 C) 98.4 F (36.9 C)  TempSrc: Oral Oral Oral Oral  SpO2: 98% 97% 100% 100%  Weight:      Height:        Intake/Output Summary (Last 24 hours) at 04/27/2018 1410 Last data  filed at 04/26/2018 1823 Gross per 24 hour  Intake 120 ml  Output -  Net 120 ml   Filed Weights   04/26/18 0004  Weight: 64.4 kg (142 lb)    Examination:  General exam: Appears calm and comfortable Respiratory system: Clear to auscultation. Respiratory effort normal. Cardiovascular system: S1 & S2 heard, RRR. No murmurs. Gastrointestinal system: Abdomen is nondistended, soft and nontender. Normal bowel sounds heard. Central nervous system: Alert. Extremities: No edema. No calf tenderness Skin: No cyanosis. No rashes Psychiatry: Slightly odd affect    Data Reviewed: I have personally reviewed following labs and imaging studies  CBC: Recent Labs  Lab 04/25/18 1532 04/27/18 0538  WBC 13.5* 15.1*  HGB 14.2 13.2  HCT 43.3 40.5  MCV 91.5 91.2  PLT 448* 518   Basic Metabolic Panel: Recent Labs  Lab 04/25/18 1532 04/27/18 0538  NA 138 138  K 4.3 3.7  CL 100* 102  CO2 25 28  GLUCOSE 102* 91  BUN 17 13  CREATININE 0.90 0.87  CALCIUM 9.3 9.0   GFR: Estimated Creatinine Clearance: 85.6 mL/min (by C-G formula based on SCr of 0.87 mg/dL). Liver Function Tests: Recent Labs  Lab 04/25/18 1532  AST 20  ALT 17  ALKPHOS 93  BILITOT 0.3  PROT 7.6  ALBUMIN 3.9   Recent Labs  Lab 04/25/18 1532  LIPASE 31   No results for input(s): AMMONIA in the last 168 hours. Coagulation Profile: Recent Labs  Lab 04/27/18 0538  INR 0.98   Cardiac Enzymes: Recent Labs  Lab 04/25/18 2308  CKTOTAL  67   BNP (last 3 results) No results for input(s): PROBNP in the last 8760 hours. HbA1C: No results for input(s): HGBA1C in the last 72 hours. CBG: No results for input(s): GLUCAP in the last 168 hours. Lipid Profile: No results for input(s): CHOL, HDL, LDLCALC, TRIG, CHOLHDL, LDLDIRECT in the last 72 hours. Thyroid Function Tests: No results for input(s): TSH, T4TOTAL, FREET4, T3FREE, THYROIDAB in the last 72 hours. Anemia Panel: No results for input(s): VITAMINB12,  FOLATE, FERRITIN, TIBC, IRON, RETICCTPCT in the last 72 hours. Sepsis Labs: No results for input(s): PROCALCITON, LATICACIDVEN in the last 168 hours.  No results found for this or any previous visit (from the past 240 hour(s)).       Radiology Studies: Dg Chest 2 View  Result Date: 04/25/2018 CLINICAL DATA:  Vomiting, body aches and headache. EXAM: CHEST - 2 VIEW COMPARISON:  None. FINDINGS: Heart size and mediastinal contours are within normal limits. Rounded mass overlies the LEFT lower lung, measuring 5 cm greatest dimension, most likely LEFT lower lobe as there is some associated obscuration of the LEFT heart border, less discretely seen on the lateral view but likely in the lingula based on the lateral view. RIGHT lung is clear. No pleural effusion or pneumothorax seen. Osseous structures about the chest are unremarkable. IMPRESSION: Rounded mass overlying the LEFT lung base, measuring 5 cm greatest dimension, most likely within the lingula, less likely within the breast or other site outside the chest. Favor primary lung cancer. Recommend chest CT with contrast for further characterization. These results were called by telephone at the time of interpretation on 04/25/2018 at 11:38 pm to Dr. Shirlyn Goltz , who verbally acknowledged these results. Electronically Signed   By: Franki Cabot M.D.   On: 04/25/2018 23:39   Ct Head Wo Contrast  Result Date: 04/25/2018 CLINICAL DATA:  Vomiting all week in, body aches and headache. EXAM: CT HEAD WITHOUT CONTRAST TECHNIQUE: Contiguous axial images were obtained from the base of the skull through the vertex without intravenous contrast. COMPARISON:  None. FINDINGS: Brain: Mass within the LEFT temporal lobe measures approximately 2.8 cm, with extensive surrounding vasogenic edema which extends upwards into the LEFT frontoparietal lobe above the level of the LEFT lateral ventricle. There is associated mass effect with effacement of the LEFT lateral ventricle  and a rightward midline shift which measures approximately 10 mm. No parenchymal hemorrhage or extra-axial hemorrhage identified. Vascular: No hyperdense vessel or unexpected calcification. Skull: Normal. Negative for fracture or focal lesion. Sinuses/Orbits: No acute finding. Other: None. IMPRESSION: Mass within the LEFT temporal lobe, almost certainly neoplastic, with associated large amount of vasogenic edema which extends upwards from the LEFT temporal lobe into the LEFT frontoparietal lobe at and just above the level of the lateral ventricles. Associated mass effect with effacement of the LEFT lateral ventricle and rightward midline shift measuring approximately 10 mm. Also suspect some degree of LEFT-sided uncal herniation. No evidence of tonsillar or transtentorial herniation. No intracranial hemorrhage. Recommend brain MRI with contrast for further characterization. These results and recommendations were called by telephone at the time of interpretation on 04/25/2018 at 11:30 pm to Dr. Geanie Kenning , who verbally acknowledged these results. Electronically Signed   By: Franki Cabot M.D.   On: 04/25/2018 23:33   Ct Chest W Contrast  Result Date: 04/26/2018 CLINICAL DATA:  Nausea vomiting with body ache EXAM: CT CHEST, ABDOMEN, AND PELVIS WITH CONTRAST TECHNIQUE: Multidetector CT imaging of the chest, abdomen and pelvis was performed following  the standard protocol during bolus administration of intravenous contrast. CONTRAST:  128m ISOVUE-300 IOPAMIDOL (ISOVUE-300) INJECTION 61% COMPARISON:  Chest x-ray 04/25/2018 FINDINGS: CT CHEST FINDINGS Cardiovascular: Nonaneurysmal aorta. No pericardial effusion. Normal heart size. Mediastinum/Nodes: Subcentimeter hypodensity right lobe of thyroid. Midline trachea. 9 mm lymph node adjacent to the anterior arch. Esophagus within normal limits. Lungs/Pleura: No pleural effusion or pneumothorax. 5.6 x 3.9 cm heterogenous mass within the lingula. Musculoskeletal: No  acute or suspicious abnormality. CT ABDOMEN PELVIS FINDINGS Hepatobiliary: No focal liver abnormality is seen. No gallstones, gallbladder wall thickening, or biliary dilatation. Pancreas: Unremarkable. No pancreatic ductal dilatation or surrounding inflammatory changes. Spleen: Normal in size without focal abnormality. Adrenals/Urinary Tract: Adrenal glands are unremarkable. Kidneys are normal, without renal calculi, focal lesion, or hydronephrosis. Bladder is unremarkable. Stomach/Bowel: Stomach is within normal limits. Appendix appears normal. No evidence of bowel wall thickening, distention, or inflammatory changes. Vascular/Lymphatic: Nonaneurysmal aorta.  No significant adenopathy. Reproductive: Uterus and bilateral adnexa are unremarkable. Other: Small free fluid in the pelvis.  No free air. Musculoskeletal: No acute or significant osseous findings. IMPRESSION: 1. 5.6 x 3.9 cm heterogenous solid enhancing mass in the left lower lung. Mass is not separable from the pericardium and appears to displace surrounding bronchi and left pulmonary fissure, suggesting that this represents a pericardial mass as opposed to an intraparenchymal lung mass. 2. Few prominent lymph nodes adjacent to the aortic arch. 3. No CT evidence for acute intra-abdominal or pelvic abnormality. Small amount of free fluid in the pelvis. Electronically Signed   By: KDonavan FoilM.D.   On: 04/26/2018 01:59   Mr BJeri CosWSHContrast  Result Date: 04/26/2018 CLINICAL DATA:  Initial evaluation for intracranial tumor. EXAM: MRI HEAD WITHOUT AND WITH CONTRAST TECHNIQUE: Multiplanar, multiecho pulse sequences of the brain and surrounding structures were obtained without and with intravenous contrast. CONTRAST:  174mMULTIHANCE GADOBENATE DIMEGLUMINE 529 MG/ML IV SOLN COMPARISON:  Prior head CT from 04/25/2018 as well as prior chest CT from earlier the same day. FINDINGS: Brain: Previously identified mass positioned at the anterior left temporal  lobe again seen, measuring 3.0 x 2.7 x 2.6 cm (AP by transverse by craniocaudad). Heterogeneous T2 signal abnormality with scattered foci of susceptibility artifact within the central aspect of the lesion consistent with necrosis. Lesion demonstrates avid postcontrast rim enhancement. Lesion is fairly well demarcated anteriorly and medially, but demonstrates somewhat infiltrative enhancement along its lateral and posterior aspect (series 11, image 71). Enhancement closely approximates the adjacent temporal horn of the left lateral ventricle without definite ependymal enhancement or intraventricular extension. Associated extensive vasogenic edema and regional mass effect throughout the left frontotemporal region with extension into the left cerebral peduncle and left midbrain. Mass effect on the left lateral ventricle which is partially effaced. Associated 8 mm of left-to-right midline shift with crowding of the basilar cisterns. No hydrocephalus or frank ventricular trapping. No other mass lesions or abnormal enhancement within the brain. Remainder of the brain is normal in appearance. No acute infarct. No other mass lesion or abnormal enhancement. No extra-axial fluid collection. Major dural sinuses are grossly patent. Pituitary gland suprasellar region normal. Vascular: Major intracranial vascular flow voids are well maintained. Skull and upper cervical spine: Craniocervical junction within normal limits. Upper cervical spine normal. Visualized osseous structures within normal limits. No discrete osseous lesions. Scalp soft tissues unremarkable. Sinuses/Orbits: Globes and orbital soft tissues within normal limits. Small right maxillary sinus retention cyst. Paranasal sinuses are otherwise clear. No mastoid effusion. Inner ear structures grossly  normal. Other: None. IMPRESSION: 1. 3.0 x 2.7 x 2.6 cm rim enhancing mass at the anterior left temporal lobe. 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 2. Associated vasogenic edema with regional mass effect and 8 mm of left-to-right midline shift. Electronically Signed   By: Jeannine Boga M.D.   On: 04/26/2018 15:09   Ct Abdomen Pelvis W Contrast  Result Date: 04/26/2018 CLINICAL DATA:  Nausea vomiting with body ache EXAM: CT CHEST, ABDOMEN, AND PELVIS WITH CONTRAST TECHNIQUE: Multidetector CT imaging of the chest, abdomen and pelvis was performed following the standard protocol during bolus administration of intravenous contrast. CONTRAST:  128m ISOVUE-300 IOPAMIDOL (ISOVUE-300) INJECTION 61% COMPARISON:  Chest x-ray 04/25/2018 FINDINGS: CT CHEST FINDINGS Cardiovascular: Nonaneurysmal aorta. No pericardial effusion. Normal heart size. Mediastinum/Nodes: Subcentimeter hypodensity right lobe of thyroid. Midline trachea. 9 mm lymph node adjacent to the anterior arch. Esophagus within normal limits. Lungs/Pleura: No pleural effusion or pneumothorax. 5.6 x 3.9 cm heterogenous mass within the lingula. Musculoskeletal: No acute or suspicious abnormality. CT ABDOMEN PELVIS FINDINGS Hepatobiliary: No focal liver abnormality is seen. No gallstones, gallbladder wall thickening, or biliary dilatation. Pancreas: Unremarkable. No pancreatic ductal dilatation or surrounding inflammatory changes. Spleen: Normal in size without focal abnormality. Adrenals/Urinary Tract: Adrenal glands are unremarkable. Kidneys are normal, without renal calculi, focal lesion, or hydronephrosis. Bladder is unremarkable. Stomach/Bowel: Stomach is within normal limits. Appendix appears normal. No evidence of bowel wall thickening, distention, or inflammatory changes. Vascular/Lymphatic: Nonaneurysmal aorta.  No significant adenopathy. Reproductive: Uterus and bilateral adnexa are unremarkable. Other: Small free fluid in the pelvis.  No free air. Musculoskeletal: No acute or significant osseous findings. IMPRESSION: 1. 5.6 x 3.9 cm heterogenous solid enhancing mass  in the left lower lung. Mass is not separable from the pericardium and appears to displace surrounding bronchi and left pulmonary fissure, suggesting that this represents a pericardial mass as opposed to an intraparenchymal lung mass. 2. Few prominent lymph nodes adjacent to the aortic arch. 3. No CT evidence for acute intra-abdominal or pelvic abnormality. Small amount of free fluid in the pelvis. Electronically Signed   By: KDonavan FoilM.D.   On: 04/26/2018 01:59        Scheduled Meds: . dexamethasone  4 mg Intravenous Q6H  . [START ON 04/28/2018] enoxaparin (LOVENOX) injection  40 mg Subcutaneous Daily  . feeding supplement (ENSURE ENLIVE)  237 mL Oral BID BM   Continuous Infusions: . sodium chloride 75 mL/hr at 04/26/18 0840     LOS: 1 day     RCordelia Poche MD [facility] 04/27/2018, 2:10 PM Pager: [phone] If 7PM-7AM, please contact night-coverage [email or web address] 04/27/2018, 2:10 PM

## 2018-04-27 NOTE — Consult Note (Signed)
Chief Complaint: Patient was seen in consultation today for left lung mass/pericardial mass biopsy vs aspiration Chief Complaint  Patient presents with  . Emesis   at the request of Dr Benna Dunks  Supervising Physician: Sandi Mariscal  Patient Status: [facility] - In-pt  History of Present Illness: Leslie [name] is a 42 y.o. female   Altered Mental Status N/V; Headache  to ED 5/13 Work up included CT Head: IMPRESSION: Mass within the LEFT temporal lobe, almost certainly neoplastic, with associated large amount of vasogenic edema which extends upwards from the LEFT temporal lobe into the LEFT frontoparietal lobe at and just above the level of the lateral ventricles. Associated mass effect with effacement of the LEFT lateral ventricle and rightward midline shift measuring approximately 10 mm. Also suspect some degree of LEFT-sided uncal herniation. No evidence of tonsillar or transtentorial herniation.  CT Abd Pelvis:  IMPRESSION: 1. 5.6 x 3.9 cm heterogenous solid enhancing mass in the left lower lung. Mass is not separable from the pericardium and appears to displace surrounding bronchi and left pulmonary fissure, suggesting that this represents a pericardial mass as opposed to an intraparenchymal lung mass. 2. Few prominent lymph nodes adjacent to the aortic arch. 3. No CT evidence for acute intra-abdominal or pelvic abnormality. Small amount of free fluid in the pelvis.  Request has been made for biopsy of lung/pericardial mass Moving without PET secondary tight time frame for possible surgery asap.  Dr Pascal Lux reviewed imaging and has spoken to Dr Servando Snare Dr Pascal Lux has approved procedure  Past Medical History:  Diagnosis Date  . Migraines   . Yeast infection     History reviewed. No pertinent surgical history.  Allergies: Patient has no known allergies.  Medications: Prior to Admission medications   Not on File     Family History  Problem Relation Age  of Onset  . Cancer Father        lung    Social History   Socioeconomic History  . Marital status: Divorced    Spouse name: Not on file  . Number of children: Not on file  . Years of education: Not on file  . Highest education level: Not on file  Occupational History  . Not on file  Social Needs  . Financial resource strain: Not on file  . Food insecurity:    Worry: Not on file    Inability: Not on file  . Transportation needs:    Medical: Not on file    Non-medical: Not on file  Tobacco Use  . Smoking status: Never Smoker  . Smokeless tobacco: Never Used  Substance and Sexual Activity  . Alcohol use: Yes  . Drug use: No  . Sexual activity: Yes    Birth control/protection: Condom  Lifestyle  . Physical activity:    Days per week: Not on file    Minutes per session: Not on file  . Stress: Not on file  Relationships  . Social connections:    Talks on phone: Not on file    Gets together: Not on file    Attends religious service: Not on file    Active member of club or organization: Not on file    Attends meetings of clubs or organizations: Not on file    Relationship status: Not on file  Other Topics Concern  . Not on file  Social History Narrative  . Not on file    Review of Systems: A 12 point ROS discussed and pertinent positives are  indicated in the HPI above.  All other systems are negative.  Review of Systems  Constitutional: Positive for activity change and appetite change. Negative for fatigue.  HENT: Negative for tinnitus and trouble swallowing.   Eyes: Negative for visual disturbance.  Respiratory: Negative for cough and shortness of breath.   Cardiovascular: Negative for chest pain.  Gastrointestinal: Positive for abdominal pain, nausea and vomiting.  Musculoskeletal: Negative for back pain and gait problem.  Neurological: Positive for headaches. Negative for dizziness, tremors, seizures, syncope, facial asymmetry, speech difficulty, weakness,  light-headedness and numbness.  Psychiatric/Behavioral: Positive for decreased concentration. Negative for behavioral problems and confusion.    Vital Signs: BP 110/75 (BP Location: Right Arm)   Pulse (!) 51   Temp 98.2 F (36.8 C) (Oral)   Resp 16   Ht 5' 9"  (1.753 m)   Wt 142 lb (64.4 kg)   LMP 04/18/2018 Comment: negative beta HCG 04/25/18  SpO2 100%   Breastfeeding? Unknown   BMI 20.97 kg/m   Physical Exam  Constitutional: She is oriented to person, place, and time.  Cardiovascular: Normal rate, regular rhythm and normal heart sounds.  Pulmonary/Chest: Effort normal and breath sounds normal.  Abdominal: Soft. Bowel sounds are normal.  Musculoskeletal: Normal range of motion.  Neurological: She is oriented to person, place, and time.  Skin: Skin is warm and dry.  Psychiatric: She has a normal mood and affect. Her behavior is normal. Judgment and thought content normal.  Nursing note and vitals reviewed.   Imaging: Dg Chest 2 View  Result Date: 04/25/2018 CLINICAL DATA:  Vomiting, body aches and headache. EXAM: CHEST - 2 VIEW COMPARISON:  None. FINDINGS: Heart size and mediastinal contours are within normal limits. Rounded mass overlies the LEFT lower lung, measuring 5 cm greatest dimension, most likely LEFT lower lobe as there is some associated obscuration of the LEFT heart border, less discretely seen on the lateral view but likely in the lingula based on the lateral view. RIGHT lung is clear. No pleural effusion or pneumothorax seen. Osseous structures about the chest are unremarkable. IMPRESSION: Rounded mass overlying the LEFT lung base, measuring 5 cm greatest dimension, most likely within the lingula, less likely within the breast or other site outside the chest. Favor primary lung cancer. Recommend chest CT with contrast for further characterization. These results were called by telephone at the time of interpretation on 04/25/2018 at 11:38 pm to Dr. Shirlyn Goltz , who verbally  acknowledged these results. Electronically Signed   By: Franki Cabot M.D.   On: 04/25/2018 23:39   Ct Head Wo Contrast  Result Date: 04/25/2018 CLINICAL DATA:  Vomiting all week in, body aches and headache. EXAM: CT HEAD WITHOUT CONTRAST TECHNIQUE: Contiguous axial images were obtained from the base of the skull through the vertex without intravenous contrast. COMPARISON:  None. FINDINGS: Brain: Mass within the LEFT temporal lobe measures approximately 2.8 cm, with extensive surrounding vasogenic edema which extends upwards into the LEFT frontoparietal lobe above the level of the LEFT lateral ventricle. There is associated mass effect with effacement of the LEFT lateral ventricle and a rightward midline shift which measures approximately 10 mm. No parenchymal hemorrhage or extra-axial hemorrhage identified. Vascular: No hyperdense vessel or unexpected calcification. Skull: Normal. Negative for fracture or focal lesion. Sinuses/Orbits: No acute finding. Other: None. IMPRESSION: Mass within the LEFT temporal lobe, almost certainly neoplastic, with associated large amount of vasogenic edema which extends upwards from the LEFT temporal lobe into the LEFT frontoparietal lobe at and  just above the level of the lateral ventricles. Associated mass effect with effacement of the LEFT lateral ventricle and rightward midline shift measuring approximately 10 mm. Also suspect some degree of LEFT-sided uncal herniation. No evidence of tonsillar or transtentorial herniation. No intracranial hemorrhage. Recommend brain MRI with contrast for further characterization. These results and recommendations were called by telephone at the time of interpretation on 04/25/2018 at 11:30 pm to Dr. Geanie Kenning , who verbally acknowledged these results. Electronically Signed   By: Franki Cabot M.D.   On: 04/25/2018 23:33   Ct Chest W Contrast  Result Date: 04/26/2018 CLINICAL DATA:  Nausea vomiting with body ache EXAM: CT CHEST,  ABDOMEN, AND PELVIS WITH CONTRAST TECHNIQUE: Multidetector CT imaging of the chest, abdomen and pelvis was performed following the standard protocol during bolus administration of intravenous contrast. CONTRAST:  117m ISOVUE-300 IOPAMIDOL (ISOVUE-300) INJECTION 61% COMPARISON:  Chest x-ray 04/25/2018 FINDINGS: CT CHEST FINDINGS Cardiovascular: Nonaneurysmal aorta. No pericardial effusion. Normal heart size. Mediastinum/Nodes: Subcentimeter hypodensity right lobe of thyroid. Midline trachea. 9 mm lymph node adjacent to the anterior arch. Esophagus within normal limits. Lungs/Pleura: No pleural effusion or pneumothorax. 5.6 x 3.9 cm heterogenous mass within the lingula. Musculoskeletal: No acute or suspicious abnormality. CT ABDOMEN PELVIS FINDINGS Hepatobiliary: No focal liver abnormality is seen. No gallstones, gallbladder wall thickening, or biliary dilatation. Pancreas: Unremarkable. No pancreatic ductal dilatation or surrounding inflammatory changes. Spleen: Normal in size without focal abnormality. Adrenals/Urinary Tract: Adrenal glands are unremarkable. Kidneys are normal, without renal calculi, focal lesion, or hydronephrosis. Bladder is unremarkable. Stomach/Bowel: Stomach is within normal limits. Appendix appears normal. No evidence of bowel wall thickening, distention, or inflammatory changes. Vascular/Lymphatic: Nonaneurysmal aorta.  No significant adenopathy. Reproductive: Uterus and bilateral adnexa are unremarkable. Other: Small free fluid in the pelvis.  No free air. Musculoskeletal: No acute or significant osseous findings. IMPRESSION: 1. 5.6 x 3.9 cm heterogenous solid enhancing mass in the left lower lung. Mass is not separable from the pericardium and appears to displace surrounding bronchi and left pulmonary fissure, suggesting that this represents a pericardial mass as opposed to an intraparenchymal lung mass. 2. Few prominent lymph nodes adjacent to the aortic arch. 3. No CT evidence for acute  intra-abdominal or pelvic abnormality. Small amount of free fluid in the pelvis. Electronically Signed   By: KDonavan FoilM.D.   On: 04/26/2018 01:59   Mr BJeri CosWXIContrast  Result Date: 04/26/2018 CLINICAL DATA:  Initial evaluation for intracranial tumor. EXAM: MRI HEAD WITHOUT AND WITH CONTRAST TECHNIQUE: Multiplanar, multiecho pulse sequences of the brain and surrounding structures were obtained without and with intravenous contrast. CONTRAST:  134mMULTIHANCE GADOBENATE DIMEGLUMINE 529 MG/ML IV SOLN COMPARISON:  Prior head CT from 04/25/2018 as well as prior chest CT from earlier the same day. FINDINGS: Brain: Previously identified mass positioned at the anterior left temporal lobe again seen, measuring 3.0 x 2.7 x 2.6 cm (AP by transverse by craniocaudad). Heterogeneous T2 signal abnormality with scattered foci of susceptibility artifact within the central aspect of the lesion consistent with necrosis. Lesion demonstrates avid postcontrast rim enhancement. Lesion is fairly well demarcated anteriorly and medially, but demonstrates somewhat infiltrative enhancement along its lateral and posterior aspect (series 11, image 71). Enhancement closely approximates the adjacent temporal horn of the left lateral ventricle without definite ependymal enhancement or intraventricular extension. Associated extensive vasogenic edema and regional mass effect throughout the left frontotemporal region with extension into the left cerebral peduncle and left midbrain. Mass effect on the  left lateral ventricle which is partially effaced. Associated 8 mm of left-to-right midline shift with crowding of the basilar cisterns. No hydrocephalus or frank ventricular trapping. No other mass lesions or abnormal enhancement within the brain. Remainder of the brain is normal in appearance. No acute infarct. No other mass lesion or abnormal enhancement. No extra-axial fluid collection. Major dural sinuses are grossly patent. Pituitary  gland suprasellar region normal. Vascular: Major intracranial vascular flow voids are well maintained. Skull and upper cervical spine: Craniocervical junction within normal limits. Upper cervical spine normal. Visualized osseous structures within normal limits. No discrete osseous lesions. Scalp soft tissues unremarkable. Sinuses/Orbits: Globes and orbital soft tissues within normal limits. Small right maxillary sinus retention cyst. Paranasal sinuses are otherwise clear. No mastoid effusion. Inner ear structures grossly normal. Other: None. IMPRESSION: 1. 3.0 x 2.7 x 2.6 cm rim enhancing mass at the anterior left temporal lobe. 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 2. Associated vasogenic edema with regional mass effect and 8 mm of left-to-right midline shift. Electronically Signed   By: Jeannine Boga M.D.   On: 04/26/2018 15:09   Ct Abdomen Pelvis W Contrast  Result Date: 04/26/2018 CLINICAL DATA:  Nausea vomiting with body ache EXAM: CT CHEST, ABDOMEN, AND PELVIS WITH CONTRAST TECHNIQUE: Multidetector CT imaging of the chest, abdomen and pelvis was performed following the standard protocol during bolus administration of intravenous contrast. CONTRAST:  163m ISOVUE-300 IOPAMIDOL (ISOVUE-300) INJECTION 61% COMPARISON:  Chest x-ray 04/25/2018 FINDINGS: CT CHEST FINDINGS Cardiovascular: Nonaneurysmal aorta. No pericardial effusion. Normal heart size. Mediastinum/Nodes: Subcentimeter hypodensity right lobe of thyroid. Midline trachea. 9 mm lymph node adjacent to the anterior arch. Esophagus within normal limits. Lungs/Pleura: No pleural effusion or pneumothorax. 5.6 x 3.9 cm heterogenous mass within the lingula. Musculoskeletal: No acute or suspicious abnormality. CT ABDOMEN PELVIS FINDINGS Hepatobiliary: No focal liver abnormality is seen. No gallstones, gallbladder wall thickening, or biliary dilatation. Pancreas: Unremarkable. No pancreatic ductal  dilatation or surrounding inflammatory changes. Spleen: Normal in size without focal abnormality. Adrenals/Urinary Tract: Adrenal glands are unremarkable. Kidneys are normal, without renal calculi, focal lesion, or hydronephrosis. Bladder is unremarkable. Stomach/Bowel: Stomach is within normal limits. Appendix appears normal. No evidence of bowel wall thickening, distention, or inflammatory changes. Vascular/Lymphatic: Nonaneurysmal aorta.  No significant adenopathy. Reproductive: Uterus and bilateral adnexa are unremarkable. Other: Small free fluid in the pelvis.  No free air. Musculoskeletal: No acute or significant osseous findings. IMPRESSION: 1. 5.6 x 3.9 cm heterogenous solid enhancing mass in the left lower lung. Mass is not separable from the pericardium and appears to displace surrounding bronchi and left pulmonary fissure, suggesting that this represents a pericardial mass as opposed to an intraparenchymal lung mass. 2. Few prominent lymph nodes adjacent to the aortic arch. 3. No CT evidence for acute intra-abdominal or pelvic abnormality. Small amount of free fluid in the pelvis. Electronically Signed   By: KDonavan FoilM.D.   On: 04/26/2018 01:59    Labs:  CBC: Recent Labs    04/25/18 1532 04/27/18 0538  WBC 13.5* 15.1*  HGB 14.2 13.2  HCT 43.3 40.5  PLT 448* 379    COAGS: Recent Labs    04/27/18 0538  INR 0.98    BMP: Recent Labs    04/25/18 1532 04/27/18 0538  NA 138 138  K 4.3 3.7  CL 100* 102  CO2 25 28  GLUCOSE 102* 91  BUN 17 13  CALCIUM 9.3 9.0  CREATININE 0.90 0.87  GFRNONAA >60 >60  GFRAA >60 >60    LIVER FUNCTION TESTS: Recent Labs    04/25/18 1532  BILITOT 0.3  AST 20  ALT 17  ALKPHOS 93  PROT 7.6  ALBUMIN 3.9    TUMOR MARKERS: No results for input(s): AFPTM, CEA, CA199, CHROMGRNA in the last 8760 hours.  Assessment and Plan:  Brain mass Left lung/pericardial mass Scheduled for biopsy of lung/pericardial mass Risks and benefits  discussed with the patient including, but not limited to bleeding, hemoptysis, respiratory failure requiring intubation, infection, pneumothorax requiring chest tube placement, stroke from air embolism or even death.  All of the patient's questions were answered, patient is agreeable to proceed. Consent signed and in chart.   Thank you for this interesting consult.  I greatly enjoyed meeting Vonna Brabson and look forward to participating in their care.  A copy of this report was sent to the requesting provider on this date.  Electronically Signed: Lavonia Drafts, PA-C 04/27/2018, 10:18 AM   I spent a total of 40 Minutes    in face to face in clinical consultation, greater than 50% of which was counseling/coordinating care for left lung/pericardial mass bx

## 2018-04-27 NOTE — Progress Notes (Signed)
Came into room to turn off call light, patients S/O said he forgot she wasn't supposed to eat and brought her an apple, cashews, and water. RN explained risks of aspiration and need to be NPO prior to procedure. MD notified, IR notified. Will continue to monitor.

## 2018-04-27 NOTE — Procedures (Signed)
Pre procedural Dx: Left upper lobe/lingular mass  Post procedural Dx: Same  Technically successful CT guided biopsy of indeterminate mass within the left upper lobe/lingula. Sample also sent to lab for culture.    EBL: None.   Complications: None immediate.   Ronny Bacon, MD Pager #: [phone]

## 2018-04-27 NOTE — Progress Notes (Addendum)
Pt reportedly removed IVs over night, as IVs were not present upon assessment. Unable to give decadron at scheduled time due to this. IV team placed new IV after RN attempt

## 2018-04-27 NOTE — Sedation Documentation (Signed)
Patient is resting comfortably. 

## 2018-04-27 NOTE — Progress Notes (Signed)
Pt transferred to IR by transport in bed

## 2018-04-28 DIAGNOSIS — C3412 Malignant neoplasm of upper lobe, left bronchus or lung: Secondary | ICD-10-CM | POA: Diagnosis not present

## 2018-04-28 LAB — BASIC METABOLIC PANEL
Anion gap: 8 (ref 5–15)
BUN: 13 mg/dL (ref 6–20)
CO2: 23 mmol/L (ref 22–32)
Calcium: 7.6 mg/dL — ABNORMAL LOW (ref 8.9–10.3)
Chloride: 109 mmol/L (ref 101–111)
Creatinine, Ser: 0.62 mg/dL (ref 0.44–1.00)
GFR calc Af Amer: >60 mL/min (ref >60)
GFR calc non Af Amer: >60 mL/min (ref >60)
Glucose, Bld: 87 mg/dL (ref 65–99)
Potassium: 3.8 mmol/L (ref 3.5–5.1)
Sodium: 140 mmol/L (ref 135–145)

## 2018-04-28 LAB — CBC
HCT: 37.1 % (ref 36.0–46.0)
Hemoglobin: 12 g/dL (ref 12.0–15.0)
MCH: 29.6 pg (ref 26.0–34.0)
MCHC: 32.3 g/dL (ref 30.0–36.0)
MCV: 91.4 fL (ref 78.0–100.0)
Platelets: 372 10*3/uL (ref 150–400)
RBC: 4.06 MIL/uL (ref 3.87–5.11)
RDW: 12.1 % (ref 11.5–15.5)
WBC: 16.9 10*3/uL — ABNORMAL HIGH (ref 4.0–10.5)

## 2018-04-28 LAB — ACID FAST SMEAR (AFB, MYCOBACTERIA): Acid Fast Smear: NEGATIVE

## 2018-04-28 MED ORDER — CEFAZOLIN SODIUM-DEXTROSE 2-4 GM/100ML-% IV SOLN
2.0000 g | INTRAVENOUS | Status: AC
  Administered 2018-04-29: 2 g via INTRAVENOUS
  Filled 2018-04-28 (×2): qty 100

## 2018-04-28 NOTE — Progress Notes (Signed)
Patient ID: Cordia Miklos, female   DOB: Feb 25, 1976, 42 y.o.   MRN: [id number]      [address]       [place], [id number]             [phone]                   Procedure(s) (LRB): LEFT CRANIOTOMY FOR  TUMOR BRAIN LAB (Left) APPLICATION OF CRANIAL NAVIGATION (Left)  LOS: 2 days   Subjective: Feels ok this am, no complaint   Objective: Vital signs in last 24 hours: Patient Vitals for the past 24 hrs:  BP Temp Temp src Pulse Resp SpO2  04/28/18 0758 111/71 98.1 F (36.7 C) Oral (!) 53 - 99 %  04/28/18 0402 (!) 99/53 98.2 F (36.8 C) Oral (!) 57 18 98 %  04/28/18 0022 109/68 98.5 F (36.9 C) Oral (!) 57 20 99 %  04/27/18 2055 109/76 98.3 F (36.8 C) Oral 67 20 98 %  04/27/18 1716 112/82 - - (!) 54 16 100 %  04/27/18 1620 103/70 - - 62 14 97 %  04/27/18 1605 112/78 - - (!) 52 14 97 %  04/27/18 1600 116/75 - - (!) 55 14 100 %  04/27/18 1555 113/68 - - (!) 54 14 100 %  04/27/18 1550 111/70 - - 69 14 100 %  04/27/18 1545 109/69 - - (!) 59 15 100 %  04/27/18 1503 111/74 - - (!) 57 16 100 %  04/27/18 1456 119/82 - - (!) 56 14 100 %  04/27/18 1215 112/74 98.4 F (36.9 C) Oral (!) 56 16 100 %    Filed Weights   04/26/18 0004  Weight: 142 lb (64.4 kg)    Hemodynamic parameters for last 24 hours:    Intake/Output from previous day: 05/15 0701 - 05/16 0700 In: 3276.3 [I.V.:3276.3] Out: -  Intake/Output this shift: No intake/output data recorded.  Scheduled Meds: . dexamethasone  4 mg Intravenous Q6H  . enoxaparin (LOVENOX) injection  40 mg Subcutaneous Daily  . feeding supplement (ENSURE ENLIVE)  237 mL Oral BID BM   Continuous Infusions: . sodium chloride 75 mL/hr at 04/28/18 0359   PRN Meds:.acetaminophen **OR** acetaminophen, ondansetron **OR** ondansetron (ZOFRAN) IV  General appearance: alert, cooperative and slowed mentation Neurologic: intact Heart: regular rate and rhythm, S1, S2 normal, no murmur, click, rub or gallop Lungs:  clear to auscultation bilaterally Abdomen: soft, non-tender; bowel sounds normal; no masses,  no organomegaly Extremities: extremities normal, atraumatic, no cyanosis or edema and Homans sign is negative, no sign of DVT  Lab Results: CBC: Recent Labs    04/27/18 0538 04/28/18 0331  WBC 15.1* 16.9*  HGB 13.2 12.0  HCT 40.5 37.1  PLT 379 372   BMET:  Recent Labs    04/27/18 0538 04/28/18 0331  NA 138 140  K 3.7 3.8  CL 102 109  CO2 28 23  GLUCOSE 91 87  BUN 13 13  CREATININE 0.87 0.62  CALCIUM 9.0 7.6*    PT/INR:  Recent Labs    04/27/18 0538  LABPROT 12.9  INR 0.98     Radiology Mr Jeri Cos Wo Contrast  Result Date: 04/26/2018 CLINICAL DATA:  Initial evaluation for intracranial tumor. EXAM: MRI HEAD WITHOUT AND WITH CONTRAST TECHNIQUE: Multiplanar, multiecho pulse sequences of the brain and surrounding structures were obtained without and with intravenous contrast. CONTRAST:  39m MULTIHANCE GADOBENATE DIMEGLUMINE 529 MG/ML IV SOLN COMPARISON:  Prior head CT from  04/25/2018 as well as prior chest CT from earlier the same day. FINDINGS: Brain: Previously identified mass positioned at the anterior left temporal lobe again seen, measuring 3.0 x 2.7 x 2.6 cm (AP by transverse by craniocaudad). Heterogeneous T2 signal abnormality with scattered foci of susceptibility artifact within the central aspect of the lesion consistent with necrosis. Lesion demonstrates avid postcontrast rim enhancement. Lesion is fairly well demarcated anteriorly and medially, but demonstrates somewhat infiltrative enhancement along its lateral and posterior aspect (series 11, image 71). Enhancement closely approximates the adjacent temporal horn of the left lateral ventricle without definite ependymal enhancement or intraventricular extension. Associated extensive vasogenic edema and regional mass effect throughout the left frontotemporal region with extension into the left cerebral peduncle and left  midbrain. Mass effect on the left lateral ventricle which is partially effaced. Associated 8 mm of left-to-right midline shift with crowding of the basilar cisterns. No hydrocephalus or frank ventricular trapping. No other mass lesions or abnormal enhancement within the brain. Remainder of the brain is normal in appearance. No acute infarct. No other mass lesion or abnormal enhancement. No extra-axial fluid collection. Major dural sinuses are grossly patent. Pituitary gland suprasellar region normal. Vascular: Major intracranial vascular flow voids are well maintained. Skull and upper cervical spine: Craniocervical junction within normal limits. Upper cervical spine normal. Visualized osseous structures within normal limits. No discrete osseous lesions. Scalp soft tissues unremarkable. Sinuses/Orbits: Globes and orbital soft tissues within normal limits. Small right maxillary sinus retention cyst. Paranasal sinuses are otherwise clear. No mastoid effusion. Inner ear structures grossly normal. Other: None. IMPRESSION: 1. 3.0 x 2.7 x 2.6 cm rim enhancing mass at the anterior left temporal lobe. 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 2. Associated vasogenic edema with regional mass effect and 8 mm of left-to-right midline shift. Electronically Signed   By: Jeannine Boga M.D.   On: 04/26/2018 15:09   Ct Biopsy  Result Date: 04/27/2018 INDICATION: No known primary, now with left upper lobe/lingular mass and brain lesion worrisome for metastatic bronchogenic carcinoma versus infection. Please perform CT-guided biopsy for tissue diagnostic purposes. EXAM: CT-GUIDED BIOPSY OF LEFT UPPER LOBE PULMONARY MASS COMPARISON:  CT the chest, abdomen and pelvis - 04/26/2018 MEDICATIONS: None. ANESTHESIA/SEDATION: Fentanyl 50 mcg IV; Versed 1 mg IV Sedation time: 14 minutes; The patient was continuously monitored during the procedure by the interventional radiology nurse under  my direct supervision. CONTRAST:  None COMPLICATIONS: None immediate. PROCEDURE: Informed consent was obtained from the patient following an explanation of the procedure, risks, benefits and alternatives. The patient understands,agrees and consents for the procedure. All questions were addressed. A time out was performed prior to the initiation of the procedure. The patient was positioned supine, slightly RPO on the CT table and a limited chest CT was performed for procedural planning demonstrating no change to slight increase in size of the now approximately 5.8 x 4.1 cm mass within the left upper lobe crossing the left major fissure to the lingula, previously, 5.6 x 3.9 cm. The operative site was prepped and draped in the usual sterile fashion. Under sterile conditions and local anesthesia, a 17 gauge coaxial needle was advanced into the peripheral aspect of the nodule. Positioning was confirmed with intermittent CT fluoroscopy and followed by the acquisition of 4 core needle biopsies with an 18 gauge core needle biopsy device. Samples were set aside for both surgical pathologic as well as gram stain analysis. The coaxial needle was removed as approximately 2  cc of bloody fluid was aspirated. All aspirated fluid is capped and sent to the laboratory for analysis. Superficial hemostasis was achieved with manual compression. Limited post procedural chest CT was negative for pneumothorax or additional complication. A dressing was placed. The patient tolerated the procedure well without immediate postprocedural complication. The patient was escorted to have an upright chest radiograph. IMPRESSION: Technically successful CT guided core needle core biopsy of indeterminate left upper lobe/lingular mass. Samples were sent both for surgical pathologic as well as Gram stain analysis. Electronically Signed   By: Sandi Mariscal M.D.   On: 04/27/2018 16:36   Dg Chest Port 1 View  Result Date: 04/27/2018 CLINICAL DATA:  Post CT  guided Biopsy of left upper pulmonary nodule/mass EXAM: PORTABLE CHEST 1 VIEW COMPARISON:  Chest x-ray dated 04/25/2018 and chest CT from earlier today. FINDINGS: Again noted is the rounded mass at the LEFT lung base, unchanged in the short-term interval. No pleural effusion or pneumothorax appreciated status post today's CT-guided biopsy. Heart size and mediastinal contours are stable. IMPRESSION: No pleural effusion or pneumothorax seen status post today's CT-guided lung biopsy. Electronically Signed   By: Franki Cabot M.D.   On: 04/27/2018 17:22     Assessment/Plan: S/P Procedure(s) (LRB): LEFT CRANIOTOMY FOR  TUMOR BRAIN LAB (Left) APPLICATION OF CRANIAL NAVIGATION (Left) Mobilize await path results to determine treatment plan   Grace Isaac MD 04/28/2018 8:17 AM

## 2018-04-28 NOTE — Progress Notes (Signed)
1905 Bedside shift report. Pt resting in bed, visiting with sister and friend. No complaints, denies pain, SOB. WCTM  2200 Pt assessed see flow sheet. Pt updated with POC, CHG bath given, MRSA swab sent to lab. Pt denies pain, SOB, nervous for surgery in am. WCTM.

## 2018-04-28 NOTE — Progress Notes (Signed)
Consent signed, in chart. Pt aware to be NPO after midnight.Ave Filter, RN

## 2018-04-28 NOTE — Progress Notes (Signed)
Overall stable.  Headache controlled.  No new neurologic symptoms.  Remains on Decadron.  Afebrile.  Vital signs are stable.  Awake and alert.  Mildly confused but reorients easily.  Cranial nerve function normal bilaterally.  Motor and sensory function extremities normal.  CT-guided biopsy of lung mass performed yesterday.  Some results worrisome for lung abscess.  Cultures pending.  Patient with left temporal lobe mass.  Abscess versus tumor.  Plan a left-sided craniotomy and resection of mass tomorrow.  I discussed the risks and benefits with the patient.  She appears to understand as does her companion.  I have answered all their questions.  She agrees to proceed tomorrow.

## 2018-04-28 NOTE — Progress Notes (Signed)
PROGRESS NOTE    Leslie [name]  LXB:[id number] DOB: 17-Aug-1976 DOA: 04/25/2018 PCP: System, Pcp Not In   Brief Narrative: Leslie [name] is a 42 y.o. female with a history of migraine.  She presented with altered mental status and found to have a brain mass in addition to lung mass.  Concern for metastatic disease.  She has associated vasogenic edema with midline shift.  Neurosurgery consulted and is planning possible craniotomy pending results of biopsy of lung lesion.  Started on IV Decadron. Plan for craniotomy on 5/17   Assessment & Plan:   Active Problems:   Brain mass   Lung mass   Brain/lung mass Concern for metastatic disease.  Patient with associated intracranial edema with midline shift.  Neurosurgery and CT surgery consulted.  Plan for CT guided biopsy today with further plans for craniotomy. Afebrile. Leukocytosis stable and likely secondary to steroids. -Await lung biopsy/culture results of lung biopsy -Neurosurgery recommendations: Plan for craniotomy on 5/17  Vasogenic edema Secondary to brain mass. -Neurosurgery recommendations -IV Decadron  Nausea/vomiting Resolved.  Secondary to brain lesion. -Continue Zofran as needed   DVT prophylaxis: Lovenox Code Status:   Code Status: Full Code Family Communication: Friend at bedside Disposition Plan: Discharge pending neurosurgery recommendations   Consultants:   Neurosurgery  Cardiothoracic surgery  Interventional radiology  Procedures:   None  Antimicrobials:  None   Subjective: No concerns today.  Objective: Vitals:   04/27/18 2055 04/28/18 0022 04/28/18 0402 04/28/18 0758  BP: 109/76 109/68 (!) 99/53 111/71  Pulse: 67 (!) 57 (!) 57 (!) 53  Resp: 20 20 18    Temp: 98.3 F (36.8 C) 98.5 F (36.9 C) 98.2 F (36.8 C) 98.1 F (36.7 C)  TempSrc: Oral Oral Oral Oral  SpO2: 98% 99% 98% 99%  Weight:      Height:        Intake/Output Summary (Last 24 hours) at 04/28/2018  1117 Last data filed at 04/28/2018 0421 Gross per 24 hour  Intake 3276.25 ml  Output -  Net 3276.25 ml   Filed Weights   04/26/18 0004  Weight: 64.4 kg (142 lb)    Examination:  General exam: Appears calm and comfortable Respiratory system: Clear to auscultation. Respiratory effort normal. Cardiovascular system: S1 & S2 heard, RRR. No murmurs, rubs, gallops or clicks. Gastrointestinal system: Abdomen is nondistended, soft and nontender. Normal bowel sounds heard. Central nervous system: Alert and oriented. No focal neurological deficits. Extremities: No edema. No calf tenderness Skin: No cyanosis. No rashes Psychiatry: Judgement and insight appear normal. Mood & affect appropriate.     Data Reviewed: I have personally reviewed following labs and imaging studies  CBC: Recent Labs  Lab 04/25/18 1532 04/27/18 0538 04/28/18 0331  WBC 13.5* 15.1* 16.9*  HGB 14.2 13.2 12.0  HCT 43.3 40.5 37.1  MCV 91.5 91.2 91.4  PLT 448* 379 416   Basic Metabolic Panel: Recent Labs  Lab 04/25/18 1532 04/27/18 0538 04/28/18 0331  NA 138 138 140  K 4.3 3.7 3.8  CL 100* 102 109  CO2 25 28 23   GLUCOSE 102* 91 87  BUN 17 13 13   CREATININE 0.90 0.87 0.62  CALCIUM 9.3 9.0 7.6*   GFR: Estimated Creatinine Clearance: 93.1 mL/min (by C-G formula based on SCr of 0.62 mg/dL). Liver Function Tests: Recent Labs  Lab 04/25/18 1532  AST 20  ALT 17  ALKPHOS 93  BILITOT 0.3  PROT 7.6  ALBUMIN 3.9   Recent Labs  Lab 04/25/18 1532  LIPASE  31   No results for input(s): AMMONIA in the last 168 hours. Coagulation Profile: Recent Labs  Lab 04/27/18 0538  INR 0.98   Cardiac Enzymes: Recent Labs  Lab 04/25/18 2308  CKTOTAL 67   BNP (last 3 results) No results for input(s): PROBNP in the last 8760 hours. HbA1C: No results for input(s): HGBA1C in the last 72 hours. CBG: No results for input(s): GLUCAP in the last 168 hours. Lipid Profile: No results for input(s): CHOL, HDL,  LDLCALC, TRIG, CHOLHDL, LDLDIRECT in the last 72 hours. Thyroid Function Tests: No results for input(s): TSH, T4TOTAL, FREET4, T3FREE, THYROIDAB in the last 72 hours. Anemia Panel: No results for input(s): VITAMINB12, FOLATE, FERRITIN, TIBC, IRON, RETICCTPCT in the last 72 hours. Sepsis Labs: No results for input(s): PROCALCITON, LATICACIDVEN in the last 168 hours.  Recent Results (from the past 240 hour(s))  Aerobic/Anaerobic Culture (surgical/deep wound)     Status: None (Preliminary result)   Collection Time: 04/27/18  4:32 PM  Result Value Ref Range Status   Specimen Description ABSCESS LUNG  Final   Special Requests LEFT UPPER LOBE  Final   Gram Stain   Final    ABUNDANT WBC PRESENT,BOTH PMN AND MONONUCLEAR NO ORGANISMS SEEN    Culture   Final    NO GROWTH < 24 HOURS Performed at [facility], [address], [place], [place] [address]    Report Status PENDING  Incomplete         Radiology Studies: Mr Jeri Cos DP Contrast  Result Date: 04/26/2018 CLINICAL DATA:  Initial evaluation for intracranial tumor. EXAM: MRI HEAD WITHOUT AND WITH CONTRAST TECHNIQUE: Multiplanar, multiecho pulse sequences of the brain and surrounding structures were obtained without and with intravenous contrast. CONTRAST:  4m MULTIHANCE GADOBENATE DIMEGLUMINE 529 MG/ML IV SOLN COMPARISON:  Prior head CT from 04/25/2018 as well as prior chest CT from earlier the same day. FINDINGS: Brain: Previously identified mass positioned at the anterior left temporal lobe again seen, measuring 3.0 x 2.7 x 2.6 cm (AP by transverse by craniocaudad). Heterogeneous T2 signal abnormality with scattered foci of susceptibility artifact within the central aspect of the lesion consistent with necrosis. Lesion demonstrates avid postcontrast rim enhancement. Lesion is fairly well demarcated anteriorly and medially, but demonstrates somewhat infiltrative enhancement along its lateral and posterior aspect (series 11, image  71). Enhancement closely approximates the adjacent temporal horn of the left lateral ventricle without definite ependymal enhancement or intraventricular extension. Associated extensive vasogenic edema and regional mass effect throughout the left frontotemporal region with extension into the left cerebral peduncle and left midbrain. Mass effect on the left lateral ventricle which is partially effaced. Associated 8 mm of left-to-right midline shift with crowding of the basilar cisterns. No hydrocephalus or frank ventricular trapping. No other mass lesions or abnormal enhancement within the brain. Remainder of the brain is normal in appearance. No acute infarct. No other mass lesion or abnormal enhancement. No extra-axial fluid collection. Major dural sinuses are grossly patent. Pituitary gland suprasellar region normal. Vascular: Major intracranial vascular flow voids are well maintained. Skull and upper cervical spine: Craniocervical junction within normal limits. Upper cervical spine normal. Visualized osseous structures within normal limits. No discrete osseous lesions. Scalp soft tissues unremarkable. Sinuses/Orbits: Globes and orbital soft tissues within normal limits. Small right maxillary sinus retention cyst. Paranasal sinuses are otherwise clear. No mastoid effusion. Inner ear structures grossly normal. Other: None. IMPRESSION: 1. 3.0 x 2.7 x 2.6 cm rim enhancing mass at the anterior left  temporal lobe. 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 2. Associated vasogenic edema with regional mass effect and 8 mm of left-to-right midline shift. Electronically Signed   By: Jeannine Boga M.D.   On: 04/26/2018 15:09   Ct Biopsy  Result Date: 04/27/2018 INDICATION: No known primary, now with left upper lobe/lingular mass and brain lesion worrisome for metastatic bronchogenic carcinoma versus infection. Please perform CT-guided biopsy for tissue diagnostic  purposes. EXAM: CT-GUIDED BIOPSY OF LEFT UPPER LOBE PULMONARY MASS COMPARISON:  CT the chest, abdomen and pelvis - 04/26/2018 MEDICATIONS: None. ANESTHESIA/SEDATION: Fentanyl 50 mcg IV; Versed 1 mg IV Sedation time: 14 minutes; The patient was continuously monitored during the procedure by the interventional radiology nurse under my direct supervision. CONTRAST:  None COMPLICATIONS: None immediate. PROCEDURE: Informed consent was obtained from the patient following an explanation of the procedure, risks, benefits and alternatives. The patient understands,agrees and consents for the procedure. All questions were addressed. A time out was performed prior to the initiation of the procedure. The patient was positioned supine, slightly RPO on the CT table and a limited chest CT was performed for procedural planning demonstrating no change to slight increase in size of the now approximately 5.8 x 4.1 cm mass within the left upper lobe crossing the left major fissure to the lingula, previously, 5.6 x 3.9 cm. The operative site was prepped and draped in the usual sterile fashion. Under sterile conditions and local anesthesia, a 17 gauge coaxial needle was advanced into the peripheral aspect of the nodule. Positioning was confirmed with intermittent CT fluoroscopy and followed by the acquisition of 4 core needle biopsies with an 18 gauge core needle biopsy device. Samples were set aside for both surgical pathologic as well as gram stain analysis. The coaxial needle was removed as approximately 2 cc of bloody fluid was aspirated. All aspirated fluid is capped and sent to the laboratory for analysis. Superficial hemostasis was achieved with manual compression. Limited post procedural chest CT was negative for pneumothorax or additional complication. A dressing was placed. The patient tolerated the procedure well without immediate postprocedural complication. The patient was escorted to have an upright chest radiograph.  IMPRESSION: Technically successful CT guided core needle core biopsy of indeterminate left upper lobe/lingular mass. Samples were sent both for surgical pathologic as well as Gram stain analysis. Electronically Signed   By: Sandi Mariscal M.D.   On: 04/27/2018 16:36   Dg Chest Port 1 View  Result Date: 04/27/2018 CLINICAL DATA:  Post CT guided Biopsy of left upper pulmonary nodule/mass EXAM: PORTABLE CHEST 1 VIEW COMPARISON:  Chest x-ray dated 04/25/2018 and chest CT from earlier today. FINDINGS: Again noted is the rounded mass at the LEFT lung base, unchanged in the short-term interval. No pleural effusion or pneumothorax appreciated status post today's CT-guided biopsy. Heart size and mediastinal contours are stable. IMPRESSION: No pleural effusion or pneumothorax seen status post today's CT-guided lung biopsy. Electronically Signed   By: Franki Cabot M.D.   On: 04/27/2018 17:22        Scheduled Meds: . dexamethasone  4 mg Intravenous Q6H  . enoxaparin (LOVENOX) injection  40 mg Subcutaneous Daily   Continuous Infusions: . sodium chloride 75 mL/hr at 04/28/18 0359  . [START ON 04/29/2018]  ceFAZolin (ANCEF) IV       LOS: 2 days     Cordelia Poche, MD [facility] 04/28/2018, 11:17 AM Pager: [phone]  If 7PM-7AM, please contact night-coverage [email or web address] 04/28/2018,  11:17 AM

## 2018-04-29 ENCOUNTER — Inpatient Hospital Stay (HOSPITAL_COMMUNITY): Payer: BLUE CROSS/BLUE SHIELD | Admitting: Certified Registered"

## 2018-04-29 ENCOUNTER — Inpatient Hospital Stay (HOSPITAL_COMMUNITY)
Admission: EM | Disposition: A | Discharge status: Home / Self Care | Payer: Self-pay | Source: Home / Self Care | Attending: Neurosurgery

## 2018-04-29 ENCOUNTER — Encounter (HOSPITAL_COMMUNITY): Payer: Self-pay | Admitting: *Deleted

## 2018-04-29 DIAGNOSIS — Z9889 Other specified postprocedural states: Secondary | ICD-10-CM

## 2018-04-29 DIAGNOSIS — C712 Malignant neoplasm of temporal lobe: Secondary | ICD-10-CM | POA: Diagnosis not present

## 2018-04-29 HISTORY — PX: APPLICATION OF CRANIAL NAVIGATION: SHX6578

## 2018-04-29 HISTORY — PX: CRANIOTOMY: SHX93

## 2018-04-29 LAB — BASIC METABOLIC PANEL
Anion gap: 6 (ref 5–15)
BUN: 12 mg/dL (ref 6–20)
CO2: 28 mmol/L (ref 22–32)
Calcium: 8.9 mg/dL (ref 8.9–10.3)
Chloride: 106 mmol/L (ref 101–111)
Creatinine, Ser: 0.75 mg/dL (ref 0.44–1.00)
GFR calc Af Amer: >60 mL/min (ref >60)
GFR calc non Af Amer: >60 mL/min (ref >60)
Glucose, Bld: 101 mg/dL — ABNORMAL HIGH (ref 65–99)
Potassium: 4.1 mmol/L (ref 3.5–5.1)
Sodium: 140 mmol/L (ref 135–145)

## 2018-04-29 LAB — CBC
HCT: 37.7 % (ref 36.0–46.0)
Hemoglobin: 12.3 g/dL (ref 12.0–15.0)
MCH: 29.8 pg (ref 26.0–34.0)
MCHC: 32.6 g/dL (ref 30.0–36.0)
MCV: 91.3 fL (ref 78.0–100.0)
Platelets: 385 10*3/uL (ref 150–400)
RBC: 4.13 MIL/uL (ref 3.87–5.11)
RDW: 12.3 % (ref 11.5–15.5)
WBC: 18.1 10*3/uL — ABNORMAL HIGH (ref 4.0–10.5)

## 2018-04-29 LAB — SURGICAL PCR SCREEN
MRSA, PCR: NEGATIVE
Staphylococcus aureus: POSITIVE — AB

## 2018-04-29 LAB — ABO/RH: ABO/RH(D): O NEG

## 2018-04-29 LAB — TYPE AND SCREEN
ABO/RH(D): O NEG
Antibody Screen: NEGATIVE

## 2018-04-29 SURGERY — CRANIOTOMY TUMOR EXCISION
Anesthesia: General | Laterality: Left

## 2018-04-29 MED ORDER — HYDROCODONE-ACETAMINOPHEN 5-325 MG PO TABS
1.0000 | ORAL_TABLET | ORAL | Status: DC | PRN
  Administered 2018-04-29: 2 via ORAL
  Filled 2018-04-29: qty 2

## 2018-04-29 MED ORDER — ONDANSETRON HCL 4 MG/2ML IJ SOLN
INTRAMUSCULAR | Status: DC | PRN
  Administered 2018-04-29: 4 mg via INTRAVENOUS

## 2018-04-29 MED ORDER — DEXAMETHASONE SODIUM PHOSPHATE 4 MG/ML IJ SOLN
4.0000 mg | Freq: Four times a day (QID) | INTRAMUSCULAR | Status: DC
  Administered 2018-04-30 (×3): 4 mg via INTRAVENOUS
  Filled 2018-04-29 (×3): qty 1

## 2018-04-29 MED ORDER — PROPOFOL 10 MG/ML IV BOLUS
INTRAVENOUS | Status: AC
  Filled 2018-04-29: qty 40

## 2018-04-29 MED ORDER — LIDOCAINE 2% (20 MG/ML) 5 ML SYRINGE
INTRAMUSCULAR | Status: AC
Start: 2018-04-29 — End: ?
  Filled 2018-04-29: qty 10

## 2018-04-29 MED ORDER — FENTANYL CITRATE (PF) 250 MCG/5ML IJ SOLN
INTRAMUSCULAR | Status: AC
  Filled 2018-04-29: qty 5

## 2018-04-29 MED ORDER — PROMETHAZINE HCL 12.5 MG PO TABS
12.5000 mg | ORAL_TABLET | ORAL | Status: DC | PRN
  Filled 2018-04-29: qty 2

## 2018-04-29 MED ORDER — SUGAMMADEX SODIUM 500 MG/5ML IV SOLN
INTRAVENOUS | Status: AC
  Filled 2018-04-29: qty 5

## 2018-04-29 MED ORDER — LIDOCAINE HCL (CARDIAC) PF 100 MG/5ML IV SOSY
PREFILLED_SYRINGE | INTRAVENOUS | Status: DC | PRN
  Administered 2018-04-29: 60 mg via INTRATRACHEAL

## 2018-04-29 MED ORDER — BACITRACIN ZINC 500 UNIT/GM EX OINT
TOPICAL_OINTMENT | CUTANEOUS | Status: DC | PRN
  Administered 2018-04-29 (×2): 1 via TOPICAL

## 2018-04-29 MED ORDER — MIDAZOLAM HCL 2 MG/2ML IJ SOLN
INTRAMUSCULAR | Status: DC | PRN
  Administered 2018-04-29: 2 mg via INTRAVENOUS

## 2018-04-29 MED ORDER — MICROFIBRILLAR COLL HEMOSTAT EX PADS
MEDICATED_PAD | CUTANEOUS | Status: DC | PRN
  Administered 2018-04-29: 1 via TOPICAL

## 2018-04-29 MED ORDER — ALBUMIN HUMAN 5 % IV SOLN
INTRAVENOUS | Status: DC | PRN
  Administered 2018-04-29: 11:00:00 via INTRAVENOUS

## 2018-04-29 MED ORDER — LIDOCAINE-EPINEPHRINE 1 %-1:100000 IJ SOLN
INTRAMUSCULAR | Status: DC | PRN
  Administered 2018-04-29: 10 mL

## 2018-04-29 MED ORDER — ONDANSETRON HCL 4 MG/2ML IJ SOLN: 4.0000 mg | INTRAMUSCULAR | Status: DC | PRN

## 2018-04-29 MED ORDER — LIDOCAINE-EPINEPHRINE 1 %-1:100000 IJ SOLN
INTRAMUSCULAR | Status: AC
Start: 2018-04-29 — End: ?
  Filled 2018-04-29: qty 1

## 2018-04-29 MED ORDER — SODIUM CHLORIDE 0.9 % IR SOLN
Status: DC | PRN
  Administered 2018-04-29 (×2): 1000 mL

## 2018-04-29 MED ORDER — ROCURONIUM BROMIDE 100 MG/10ML IV SOLN
INTRAVENOUS | Status: DC | PRN
  Administered 2018-04-29: 20 mg via INTRAVENOUS
  Administered 2018-04-29: 50 mg via INTRAVENOUS

## 2018-04-29 MED ORDER — HYDROMORPHONE HCL 1 MG/ML IJ SOLN
INTRAMUSCULAR | Status: AC
  Filled 2018-04-29: qty 1

## 2018-04-29 MED ORDER — REMIFENTANIL HCL 1 MG IV SOLR
0.0125 ug/kg/min | INTRAVENOUS | Status: AC
  Administered 2018-04-29: .2 ug/kg/min via INTRAVENOUS
  Filled 2018-04-29: qty 2000

## 2018-04-29 MED ORDER — BACITRACIN ZINC 500 UNIT/GM EX OINT
TOPICAL_OINTMENT | CUTANEOUS | Status: AC
  Filled 2018-04-29: qty 28.35

## 2018-04-29 MED ORDER — LABETALOL HCL 5 MG/ML IV SOLN: 10.0000 mg | INTRAVENOUS | Status: DC | PRN

## 2018-04-29 MED ORDER — GELATIN ABSORBABLE MT POWD
OROMUCOSAL | Status: DC | PRN
  Administered 2018-04-29: 11:00:00 via TOPICAL

## 2018-04-29 MED ORDER — MANNITOL 25 % IV SOLN
INTRAVENOUS | Status: DC | PRN
  Administered 2018-04-29: 60 g via INTRAVENOUS

## 2018-04-29 MED ORDER — SUCCINYLCHOLINE CHLORIDE 200 MG/10ML IV SOSY
PREFILLED_SYRINGE | INTRAVENOUS | Status: AC
Start: 2018-04-29 — End: ?
  Filled 2018-04-29: qty 10

## 2018-04-29 MED ORDER — FENTANYL CITRATE (PF) 250 MCG/5ML IJ SOLN
INTRAMUSCULAR | Status: DC | PRN
  Administered 2018-04-29: 150 ug via INTRAVENOUS

## 2018-04-29 MED ORDER — EPHEDRINE SULFATE 50 MG/ML IJ SOLN
INTRAMUSCULAR | Status: AC
  Filled 2018-04-29: qty 1

## 2018-04-29 MED ORDER — ONDANSETRON HCL 4 MG PO TABS: 4.0000 mg | ORAL_TABLET | ORAL | Status: DC | PRN

## 2018-04-29 MED ORDER — DEXAMETHASONE SODIUM PHOSPHATE 10 MG/ML IJ SOLN
6.0000 mg | Freq: Four times a day (QID) | INTRAMUSCULAR | Status: AC
  Administered 2018-04-29 – 2018-04-30 (×4): 6 mg via INTRAVENOUS
  Filled 2018-04-29 (×4): qty 1

## 2018-04-29 MED ORDER — THROMBIN 5000 UNITS EX SOLR
CUTANEOUS | Status: AC
  Filled 2018-04-29: qty 5000

## 2018-04-29 MED ORDER — GELATIN ABSORBABLE MT POWD
OROMUCOSAL | Status: DC | PRN
  Administered 2018-04-29 (×2): via TOPICAL

## 2018-04-29 MED ORDER — MUPIROCIN 2 % EX OINT
1.0000 "application " | TOPICAL_OINTMENT | Freq: Two times a day (BID) | CUTANEOUS | Status: DC
  Administered 2018-04-29 (×2): 1 via NASAL
  Filled 2018-04-29: qty 22

## 2018-04-29 MED ORDER — DEXAMETHASONE SODIUM PHOSPHATE 4 MG/ML IJ SOLN: 4.0000 mg | Freq: Three times a day (TID) | INTRAMUSCULAR | Status: DC

## 2018-04-29 MED ORDER — SUGAMMADEX SODIUM 200 MG/2ML IV SOLN
INTRAVENOUS | Status: DC | PRN
  Administered 2018-04-29: 130 mg via INTRAVENOUS

## 2018-04-29 MED ORDER — MIDAZOLAM HCL 2 MG/2ML IJ SOLN
INTRAMUSCULAR | Status: AC
  Filled 2018-04-29: qty 2

## 2018-04-29 MED ORDER — THROMBIN 20000 UNITS EX SOLR
CUTANEOUS | Status: AC
  Filled 2018-04-29: qty 20000

## 2018-04-29 MED ORDER — FAMOTIDINE IN NACL 20-0.9 MG/50ML-% IV SOLN
20.0000 mg | Freq: Two times a day (BID) | INTRAVENOUS | Status: DC
  Administered 2018-04-29 – 2018-04-30 (×4): 20 mg via INTRAVENOUS
  Filled 2018-04-29 (×4): qty 50

## 2018-04-29 MED ORDER — PROPOFOL 10 MG/ML IV BOLUS
INTRAVENOUS | Status: DC | PRN
  Administered 2018-04-29: 120 mg via INTRAVENOUS
  Administered 2018-04-29: 20 mg via INTRAVENOUS

## 2018-04-29 MED ORDER — CHLORHEXIDINE GLUCONATE CLOTH 2 % EX PADS
6.0000 | MEDICATED_PAD | Freq: Every day | CUTANEOUS | Status: DC
  Administered 2018-04-28 – 2018-04-29 (×2): 6 via TOPICAL

## 2018-04-29 MED ORDER — HYDROMORPHONE HCL 1 MG/ML IJ SOLN
0.5000 mg | INTRAMUSCULAR | Status: DC | PRN
  Administered 2018-04-29 (×2): 0.5 mg via INTRAVENOUS
  Filled 2018-04-29: qty 1

## 2018-04-29 MED ORDER — ROCURONIUM BROMIDE 50 MG/5ML IV SOLN
INTRAVENOUS | Status: AC
  Filled 2018-04-29: qty 2

## 2018-04-29 SURGICAL SUPPLY — 61 items
BAG DECANTER FOR FLEXI CONT (MISCELLANEOUS) IMPLANT
BLADE CLIPPER SURG (BLADE) ×2 IMPLANT
BNDG COHESIVE 4X5 TAN NS LF (GAUZE/BANDAGES/DRESSINGS) IMPLANT
BUR ACORN 6.0 PRECISION (BURR) ×2 IMPLANT
BUR SPIRAL ROUTER 2.3 (BUR) ×2 IMPLANT
CANISTER SUCT 3000ML PPV (MISCELLANEOUS) ×4 IMPLANT
CARTRIDGE OIL MAESTRO DRILL (MISCELLANEOUS) ×2 IMPLANT
CLIP VESOCCLUDE MED 6/CT (CLIP) IMPLANT
CONT SPEC 4OZ CLIKSEAL STRL BL (MISCELLANEOUS) ×2 IMPLANT
DIFFUSER DRILL AIR PNEUMATIC (MISCELLANEOUS) ×4 IMPLANT
DRAPE CAMERA VIDEO/LASER (DRAPES) IMPLANT
DRAPE MICROSCOPE LEICA (MISCELLANEOUS) ×2 IMPLANT
DRAPE NEUROLOGICAL W/INCISE (DRAPES) ×2 IMPLANT
DRAPE STERI IOBAN 125X83 (DRAPES) IMPLANT
DRAPE SURG 17X23 STRL (DRAPES) IMPLANT
DRAPE WARM FLUID 44X44 (DRAPE) ×2 IMPLANT
ELECT CAUTERY BLADE 6.4 (BLADE) ×2 IMPLANT
ELECT REM PT RETURN 9FT ADLT (ELECTROSURGICAL) ×2
ELECTRODE REM PT RTRN 9FT ADLT (ELECTROSURGICAL) ×1 IMPLANT
GAUZE SPONGE 4X4 12PLY STRL (GAUZE/BANDAGES/DRESSINGS) ×2 IMPLANT
GAUZE SPONGE 4X4 16PLY XRAY LF (GAUZE/BANDAGES/DRESSINGS) IMPLANT
GLOVE ECLIPSE 9.0 STRL (GLOVE) ×2 IMPLANT
GLOVE EXAM NITRILE LRG STRL (GLOVE) IMPLANT
GLOVE EXAM NITRILE XL STR (GLOVE) IMPLANT
GLOVE EXAM NITRILE XS STR PU (GLOVE) IMPLANT
GOWN STRL REUS W/ TWL LRG LVL3 (GOWN DISPOSABLE) IMPLANT
GOWN STRL REUS W/ TWL XL LVL3 (GOWN DISPOSABLE) IMPLANT
GOWN STRL REUS W/TWL 2XL LVL3 (GOWN DISPOSABLE) IMPLANT
GOWN STRL REUS W/TWL LRG LVL3 (GOWN DISPOSABLE)
GOWN STRL REUS W/TWL XL LVL3 (GOWN DISPOSABLE)
HEMOSTAT SURGICEL 2X14 (HEMOSTASIS) ×2 IMPLANT
KIT BASIN OR (CUSTOM PROCEDURE TRAY) ×2 IMPLANT
KIT TURNOVER KIT B (KITS) ×2 IMPLANT
MARKER SPHERE PSV REFLC 13MM (MARKER) ×4 IMPLANT
NEEDLE HYPO 18GX1.5 BLUNT FILL (NEEDLE) IMPLANT
NEEDLE HYPO 25X1 1.5 SAFETY (NEEDLE) ×2 IMPLANT
NS IRRIG 1000ML POUR BTL (IV SOLUTION) ×4 IMPLANT
OIL CARTRIDGE MAESTRO DRILL (MISCELLANEOUS) ×4
PACK CRANIOTOMY CUSTOM (CUSTOM PROCEDURE TRAY) ×2 IMPLANT
PAD ARMBOARD 7.5X6 YLW CONV (MISCELLANEOUS) ×6 IMPLANT
PATTIES SURGICAL .25X.25 (GAUZE/BANDAGES/DRESSINGS) IMPLANT
PATTIES SURGICAL .5 X.5 (GAUZE/BANDAGES/DRESSINGS) IMPLANT
PATTIES SURGICAL .5 X3 (DISPOSABLE) IMPLANT
PATTIES SURGICAL 1X1 (DISPOSABLE) IMPLANT
PLATE 1.5  2HOLE LNG NEURO (Plate) ×2 IMPLANT
PLATE 1.5 2HOLE LNG NEURO (Plate) ×2 IMPLANT
PLATE 1.5/0.5 13MM BURR HOLE (Plate) ×2 IMPLANT
RUBBERBAND STERILE (MISCELLANEOUS) IMPLANT
SCREW SELF DRILL HT 1.5/4MM (Screw) ×18 IMPLANT
SPONGE NEURO XRAY DETECT 1X3 (DISPOSABLE) IMPLANT
SPONGE SURGIFOAM ABS GEL 100 (HEMOSTASIS) ×2 IMPLANT
STAPLER VISISTAT 35W (STAPLE) ×2 IMPLANT
SUT ETHILON 4 0 PS 2 18 (SUTURE) ×2 IMPLANT
SUT NURALON 4 0 TR CR/8 (SUTURE) ×6 IMPLANT
SUT VIC AB 2-0 CT2 18 VCP726D (SUTURE) ×4 IMPLANT
SYR CONTROL 10ML LL (SYRINGE) ×2 IMPLANT
TOWEL GREEN STERILE (TOWEL DISPOSABLE) ×2 IMPLANT
TOWEL GREEN STERILE FF (TOWEL DISPOSABLE) ×2 IMPLANT
TRAY FOLEY MTR SLVR 16FR STAT (SET/KITS/TRAYS/PACK) ×2 IMPLANT
UNDERPAD 30X30 (UNDERPADS AND DIAPERS) ×2 IMPLANT
WATER STERILE IRR 1000ML POUR (IV SOLUTION) ×2 IMPLANT

## 2018-04-29 NOTE — Progress Notes (Signed)
Postop check.  Patient with some incisional discomfort but no significant headache.  Patient wide awake.  Conversing appropriately.  Cranial nerve function intact bilaterally.  Motor examination normal bilaterally.  Wound clean and dry.  Doing well following craniotomy and resection of tumor.  Mobilize.  Plan follow-up MRI scan tomorrow.

## 2018-04-29 NOTE — Op Note (Signed)
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  Asst. Surgeon: Saintclair Halsted  Anesthesia: General  Indication: 42 year old female with headache and anxiety.  Work-up demonstrates evidence of a large left temporal lobe mass consistent with neoplasm versus abscess.  Further work-up demonstrates evidence of a left chest tumor.  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  Patient presents now for left-sided craniotomy and resection of tumor.  Operative note: After induction of anesthesia, patient position supine with her head turned toward the right and fixed in place with [place] pin headrest.  BrainLab stereotactic guidance system was then attached and reference points were made along the patient's scalp and periorbital area to orient the stereotactic system.  After safety checks ensured good stereotaxic localization the patient's left temporal scalp was prepped and draped sterilely.  A linear skin incision was made from the zygoma superiorly in front of her left pinna.  This is carried down sharply through the temporalis muscle.  Self-retaining retractor was placed.  Stereotactic guidance confirmed good positioning.  A left temporal craniotomy was then performed using a high-speed drill.  Bone was elevated.  The dura was then incised in a cruciate fashion.  The dural leaflets were retracted laterally.  A entry site into the middle temporal gyrus was then made.  Using stereotactic guidance the cortical incision was carried down deeply until the tumor was encountered.  The tumor itself was very soft and friable.  This was dissected circumferentially  and removed in several pieces.  The great majority of the tumor was also removed with gentle suction.  A gross total resection was performed.  The microscope was used for microdissection of the tumor cavity.  Hemostasis of the tumor cavity was achieved.  The tumor cavity was lined with Surgicel.  The dura was loosely reapproximated.  Gelfoam was placed over the dural repair.  Craniotomy flap was reattached using OsteoMed plates.  Temporalis muscle re-approximated with Vicryl sutures.  Scalp reapproximated with 2-0 Vicryl suture at the galea and running 4-0 nylon at the surface.  There were no apparent complications.  The patient tolerated the procedure well and she returns to the recovery room postop.

## 2018-04-29 NOTE — Anesthesia Preprocedure Evaluation (Addendum)
Anesthesia Evaluation  Patient identified by MRN, date of birth, ID band Patient awake    Reviewed: Allergy & Precautions, NPO status , Patient's Chart, lab work & pertinent test results  History of Anesthesia Complications Negative for: history of anesthetic complications  Airway Mallampati: II  TM Distance: >3 FB Neck ROM: Full    Dental  (+) Teeth Intact   Pulmonary neg pulmonary ROS,    breath sounds clear to auscultation       Cardiovascular negative cardio ROS   Rhythm:Regular     Neuro/Psych  Headaches, Brain and lung mass negative psych ROS   GI/Hepatic negative GI ROS, Neg liver ROS,   Endo/Other  negative endocrine ROS  Renal/GU negative Renal ROS     Musculoskeletal   Abdominal   Peds  Hematology negative hematology ROS (+)   Anesthesia Other Findings   Reproductive/Obstetrics                            Anesthesia Physical Anesthesia Plan  ASA: II  Anesthesia Plan: General   Post-op Pain Management:    Induction:   PONV Risk Score and Plan: 3 and Ondansetron and Dexamethasone  Airway Management Planned: Oral ETT  Additional Equipment: Arterial line  Intra-op Plan:   Post-operative Plan: Extubation in OR  Informed Consent: I have reviewed the patients History and Physical, chart, labs and discussed the procedure including the risks, benefits and alternatives for the proposed anesthesia with the patient or authorized representative who has indicated his/her understanding and acceptance.   Dental advisory given  Plan Discussed with: CRNA and Surgeon  Anesthesia Plan Comments:         Anesthesia Quick Evaluation

## 2018-04-29 NOTE — Anesthesia Procedure Notes (Signed)
Arterial Line Insertion Start/End5/17/2019 10:16 AM, 04/29/2018 10:19 AM Performed by: Oleta Mouse, MD  Patient location: OR. Preanesthetic checklist: patient identified, IV checked, site marked, risks and benefits discussed, surgical consent, monitors and equipment checked, pre-op evaluation, timeout performed and anesthesia consent Left, radial was placed Catheter size: 20 G Hand hygiene performed  and maximum sterile barriers used   Attempts: 1 Procedure performed without using ultrasound guided technique. Following insertion, dressing applied. Post procedure assessment: normal and unchanged  Patient tolerated the procedure well with no immediate complications.

## 2018-04-29 NOTE — Progress Notes (Signed)
PROGRESS NOTE    Leslie [name]  PTW:[id number] DOB: Jun 16, 1976 DOA: 04/25/2018 PCP: System, Pcp Not In   Brief Narrative: Leslie [name] is a 42 y.o. female with a history of migraine.  She presented with altered mental status and found to have a brain mass in addition to lung mass.  Concern for metastatic disease.  She has associated vasogenic edema with midline shift.  Started on IV Decadron. Plan for craniotomy today. Biopsy and culture of lung mass are pending.   Assessment & Plan:   Active Problems:   Brain mass   Lung mass   Brain/lung mass Concern for metastatic disease.  Patient with associated intracranial edema with midline shift.  Neurosurgery and CT surgery consulted.  Plan for CT guided biopsy today with further plans for craniotomy. Afebrile. Leukocytosis increased and likely secondary to steroids. -Await lung biopsy/culture results of lung biopsy; culture with no growth to date. -Neurosurgery recommendations: Plan for craniotomy today  Vasogenic edema Secondary to brain mass. -Neurosurgery recommendations -IV Decadron  Nausea/vomiting Resolved.  Secondary to brain lesion. -Continue Zofran as needed   DVT prophylaxis: Lovenox Code Status:   Code Status: Full Code Family Communication: Friend at bedside Disposition Plan: Discharge pending neurosurgery recommendations   Consultants:   Neurosurgery  Cardiothoracic surgery  Interventional radiology  Procedures:   None  Antimicrobials:  None   Subjective: Anxious about surgery. Did not sleep well overnight.  Objective: Vitals:   04/28/18 1212 04/28/18 1949 04/29/18 0009 04/29/18 0746  BP: 121/74 115/75 119/74 119/77  Pulse: 60 65 (!) 54 (!) 55  Resp: 16 16 18 18   Temp: 98 F (36.7 C) 98.2 F (36.8 C) 97.9 F (36.6 C) 98.5 F (36.9 C)  TempSrc: Oral Oral Oral Oral  SpO2: 100% 98% 100% 99%  Weight:      Height:        Intake/Output Summary (Last 24 hours) at 04/29/2018  7001 Last data filed at 04/29/2018 0400 Gross per 24 hour  Intake 2613.75 ml  Output -  Net 2613.75 ml   Filed Weights   04/26/18 0004  Weight: 64.4 kg (142 lb)    Examination:  General exam: Appears calm and comfortable Respiratory system: Clear to auscultation. Respiratory effort normal. Cardiovascular system: S1 & S2 heard, RRR. No murmurs, rubs, gallops or clicks. Gastrointestinal system: Abdomen is nondistended, soft and nontender. Normal bowel sounds heard. Central nervous system: Alert and oriented. No focal neurological deficits. Extremities: No edema. No calf tenderness Skin: No cyanosis. No rashes Psychiatry: Judgement and insight appear normal. Odd affect    Data Reviewed: I have personally reviewed following labs and imaging studies  CBC: Recent Labs  Lab 04/25/18 1532 04/27/18 0538 04/28/18 0331 04/29/18 0422  WBC 13.5* 15.1* 16.9* 18.1*  HGB 14.2 13.2 12.0 12.3  HCT 43.3 40.5 37.1 37.7  MCV 91.5 91.2 91.4 91.3  PLT 448* 379 372 749   Basic Metabolic Panel: Recent Labs  Lab 04/25/18 1532 04/27/18 0538 04/28/18 0331 04/29/18 0422  NA 138 138 140 140  K 4.3 3.7 3.8 4.1  CL 100* 102 109 106  CO2 25 28 23 28   GLUCOSE 102* 91 87 101*  BUN 17 13 13 12   CREATININE 0.90 0.87 0.62 0.75  CALCIUM 9.3 9.0 7.6* 8.9   GFR: Estimated Creatinine Clearance: 93.1 mL/min (by C-G formula based on SCr of 0.75 mg/dL). Liver Function Tests: Recent Labs  Lab 04/25/18 1532  AST 20  ALT 17  ALKPHOS 93  BILITOT 0.3  PROT  7.6  ALBUMIN 3.9   Recent Labs  Lab 04/25/18 1532  LIPASE 31   No results for input(s): AMMONIA in the last 168 hours. Coagulation Profile: Recent Labs  Lab May 21, 2018 0538  INR 0.98   Cardiac Enzymes: Recent Labs  Lab 04/25/18 2308  CKTOTAL 67   BNP (last 3 results) No results for input(s): PROBNP in the last 8760 hours. HbA1C: No results for input(s): HGBA1C in the last 72 hours. CBG: No results for input(s): GLUCAP in the  last 168 hours. Lipid Profile: No results for input(s): CHOL, HDL, LDLCALC, TRIG, CHOLHDL, LDLDIRECT in the last 72 hours. Thyroid Function Tests: No results for input(s): TSH, T4TOTAL, FREET4, T3FREE, THYROIDAB in the last 72 hours. Anemia Panel: No results for input(s): VITAMINB12, FOLATE, FERRITIN, TIBC, IRON, RETICCTPCT in the last 72 hours. Sepsis Labs: No results for input(s): PROCALCITON, LATICACIDVEN in the last 168 hours.  Recent Results (from the past 240 hour(s))  Aerobic/Anaerobic Culture (surgical/deep wound)     Status: None (Preliminary result)   Collection Time: May 21, 2018  4:32 PM  Result Value Ref Range Status   Specimen Description ABSCESS LUNG  Final   Special Requests LEFT UPPER LOBE  Final   Gram Stain   Final    ABUNDANT WBC PRESENT,BOTH PMN AND MONONUCLEAR NO ORGANISMS SEEN    Culture   Final    NO GROWTH < 24 HOURS Performed at [facility], [address] [place], [place] [address]    Report Status PENDING  Incomplete  Acid Fast Smear (AFB)     Status: None   Collection Time: 05/21/2018  4:32 PM  Result Value Ref Range Status   AFB Specimen Processing Comment  Final    Comment: Tissue Grinding and Digestion/Decontamination   Acid Fast Smear Negative  Final    Comment: (NOTE) Performed At: [facility] [place], [place] [id number] Rush Farmer MD DJ:[id number]    Source (AFB) ABSCESS  Final    Comment: LUNG LEFT UPPER LOBE Performed at [facility], [place] [address] [place], [place] [address]   Surgical pcr screen     Status: Abnormal   Collection Time: 04/28/18 10:08 PM  Result Value Ref Range Status   MRSA, PCR NEGATIVE NEGATIVE Final   Staphylococcus aureus POSITIVE (A) NEGATIVE Final    Comment: (NOTE) The Xpert SA Assay (FDA approved for NASAL specimens in patients 50 years of age and older), is one component of a comprehensive surveillance program. It is not intended to diagnose infection nor to guide or  monitor treatment. Performed at [facility], [place] [address] [place], [place] [address]          Radiology Studies: Ct Biopsy  Result Date: May 21, 2018 INDICATION: No known primary, now with left upper lobe/lingular mass and brain lesion worrisome for metastatic bronchogenic carcinoma versus infection. Please perform CT-guided biopsy for tissue diagnostic purposes. EXAM: CT-GUIDED BIOPSY OF LEFT UPPER LOBE PULMONARY MASS COMPARISON:  CT the chest, abdomen and pelvis - 04/26/2018 MEDICATIONS: None. ANESTHESIA/SEDATION: Fentanyl 50 mcg IV; Versed 1 mg IV Sedation time: 14 minutes; The patient was continuously monitored during the procedure by the interventional radiology nurse under my direct supervision. CONTRAST:  None COMPLICATIONS: None immediate. PROCEDURE: Informed consent was obtained from the patient following an explanation of the procedure, risks, benefits and alternatives. The patient understands,agrees and consents for the procedure. All questions were addressed. A time out was performed prior to the initiation of the procedure. The  patient was positioned supine, slightly RPO on the CT table and a limited chest CT was performed for procedural planning demonstrating no change to slight increase in size of the now approximately 5.8 x 4.1 cm mass within the left upper lobe crossing the left major fissure to the lingula, previously, 5.6 x 3.9 cm. The operative site was prepped and draped in the usual sterile fashion. Under sterile conditions and local anesthesia, a 17 gauge coaxial needle was advanced into the peripheral aspect of the nodule. Positioning was confirmed with intermittent CT fluoroscopy and followed by the acquisition of 4 core needle biopsies with an 18 gauge core needle biopsy device. Samples were set aside for both surgical pathologic as well as gram stain analysis. The coaxial needle was removed as approximately 2 cc of bloody fluid was aspirated. All aspirated fluid is  capped and sent to the laboratory for analysis. Superficial hemostasis was achieved with manual compression. Limited post procedural chest CT was negative for pneumothorax or additional complication. A dressing was placed. The patient tolerated the procedure well without immediate postprocedural complication. The patient was escorted to have an upright chest radiograph. IMPRESSION: Technically successful CT guided core needle core biopsy of indeterminate left upper lobe/lingular mass. Samples were sent both for surgical pathologic as well as Gram stain analysis. Electronically Signed   By: Sandi Mariscal M.D.   On: 04/27/2018 16:36   Dg Chest Port 1 View  Result Date: 04/27/2018 CLINICAL DATA:  Post CT guided Biopsy of left upper pulmonary nodule/mass EXAM: PORTABLE CHEST 1 VIEW COMPARISON:  Chest x-ray dated 04/25/2018 and chest CT from earlier today. FINDINGS: Again noted is the rounded mass at the LEFT lung base, unchanged in the short-term interval. No pleural effusion or pneumothorax appreciated status post today's CT-guided biopsy. Heart size and mediastinal contours are stable. IMPRESSION: No pleural effusion or pneumothorax seen status post today's CT-guided lung biopsy. Electronically Signed   By: Franki Cabot M.D.   On: 04/27/2018 17:22        Scheduled Meds: . Chlorhexidine Gluconate Cloth  6 each Topical Daily  . dexamethasone  4 mg Intravenous Q6H  . enoxaparin (LOVENOX) injection  40 mg Subcutaneous Daily  . mupirocin ointment  1 application Nasal BID   Continuous Infusions: . sodium chloride 75 mL/hr at 04/28/18 1607  .  ceFAZolin (ANCEF) IV       LOS: 3 days     Cordelia Poche, MD [facility] 04/29/2018, 8:22 AM Pager: [phone]  If 7PM-7AM, please contact night-coverage [email or web address] 04/29/2018, 8:22 AM

## 2018-04-29 NOTE — Anesthesia Procedure Notes (Signed)
Procedure Name: Intubation Date/Time: 04/29/2018 10:19 AM Performed by: Lance Coon, CRNA Pre-anesthesia Checklist: Patient identified, Emergency Drugs available, Suction available, Patient being monitored and Timeout performed Patient Re-evaluated:Patient Re-evaluated prior to induction Oxygen Delivery Method: Circle system utilized Preoxygenation: Pre-oxygenation with 100% oxygen Induction Type: IV induction Ventilation: Mask ventilation without difficulty Laryngoscope Size: Miller and 2 Grade View: Grade I Tube type: Oral Tube size: 7.0 mm Number of attempts: 1 Airway Equipment and Method: Stylet Placement Confirmation: ETT inserted through vocal cords under direct vision,  positive ETCO2 and breath sounds checked- equal and bilateral Secured at: 21 cm Tube secured with: Tape Dental Injury: Teeth and Oropharynx as per pre-operative assessment

## 2018-04-29 NOTE — Progress Notes (Addendum)
0400 Pt sleeping, easy to arouse, medicated per MAR. Assisted pt to bathroom. No needs at this time, pt asked not to be bothered until 0600. WCTM.   0700 Bedside shift report, pt resting in bed, significant other at bedside, updated with POC and surgery this am. No complaints, report given to Zhara, RN.

## 2018-04-29 NOTE — Care Management Note (Signed)
Case Management Note  Patient Details  Name: Leslie [name] MRN: [id number] Date of Birth: 07-Oct-1976  Subjective/Objective:                    Action/Plan: Plan is for OR today for crani. CM following for d/c needs post surgery.    Expected Discharge Date:                  Expected Discharge Plan:     In-House Referral:     Discharge planning Services     Post Acute Care Choice:    Choice offered to:     DME Arranged:    DME Agency:     HH Arranged:    HH Agency:     Status of Service:  In process, will continue to follow  If discussed at Long Length of Stay Meetings, dates discussed:    Additional Comments:  Pollie Friar, RN 04/29/2018, 8:35 AM

## 2018-04-29 NOTE — Transfer of Care (Signed)
Immediate Anesthesia Transfer of Care Note  Patient: Leslie [name]  Procedure(s) Performed: LEFT CRANIOTOMY FOR  TUMOR BRAIN LAB (Left ) APPLICATION OF CRANIAL NAVIGATION (Left )  Patient Location: PACU  Anesthesia Type:General  Level of Consciousness: awake and patient cooperative  Airway & Oxygen Therapy: Patient Spontanous Breathing  Post-op Assessment: Report given to RN and Post -op Vital signs reviewed and stable  Post vital signs: Reviewed and stable  Last Vitals:  Vitals Value Taken Time  BP 134/95 04/29/2018 12:06 PM  Temp    Pulse 81 04/29/2018 12:08 PM  Resp 15 04/29/2018 12:08 PM  SpO2 99 % 04/29/2018 12:08 PM  Vitals shown include unvalidated device data.  Last Pain:  Vitals:   04/29/18 0831  TempSrc:   PainSc: 0-No pain         Complications: No apparent anesthesia complications

## 2018-04-29 NOTE — Anesthesia Postprocedure Evaluation (Signed)
Anesthesia Post Note  Patient: Leslie [name]  Procedure(s) Performed: LEFT CRANIOTOMY FOR  TUMOR BRAIN LAB (Left ) APPLICATION OF CRANIAL NAVIGATION (Left )     Patient location during evaluation: PACU Anesthesia Type: General Level of consciousness: awake and alert Pain management: pain level controlled Vital Signs Assessment: post-procedure vital signs reviewed and stable Respiratory status: spontaneous breathing, nonlabored ventilation, respiratory function stable and patient connected to nasal cannula oxygen Cardiovascular status: blood pressure returned to baseline and stable Postop Assessment: no apparent nausea or vomiting Anesthetic complications: no    Last Vitals:  Vitals:   04/29/18 1235 04/29/18 1300  BP: 129/86 (!) 141/91  Pulse: (!) 59 (!) 59  Resp: (!) 6 10  Temp: 36.5 C   SpO2: 100% 100%    Last Pain:  Vitals:   04/29/18 1348  TempSrc:   PainSc: Asleep                 Karlos Scadden

## 2018-04-29 NOTE — Brief Op Note (Signed)
04/29/2018  11:47 AM  PATIENT:  Leslie [name]  42 y.o. female  PRE-OPERATIVE DIAGNOSIS:  BRAIN TUMOR  POST-OPERATIVE DIAGNOSIS:  brain tumor  PROCEDURE:  Procedure(s): LEFT CRANIOTOMY FOR  TUMOR BRAIN LAB (Left) APPLICATION OF CRANIAL NAVIGATION (Left)  SURGEON:  Surgeon(s) and Role:    Earnie Larsson, MD - Primary  PHYSICIAN ASSISTANT:   ASSISTANTS: Cram   ANESTHESIA:   general  EBL:  100 mL   BLOOD ADMINISTERED:none  DRAINS: none   LOCAL MEDICATIONS USED:  LIDOCAINE   SPECIMEN:  Source of Specimen:  Left temporal lobe  DISPOSITION OF SPECIMEN:  PATHOLOGY  COUNTS:  YES  TOURNIQUET:  * No tourniquets in log *  DICTATION: .Dragon Dictation  PLAN OF CARE: Admit to inpatient   PATIENT DISPOSITION:  PACU - hemodynamically stable.   Delay start of Pharmacological VTE agent (>24hrs) due to surgical blood loss or risk of bleeding: yes

## 2018-04-30 ENCOUNTER — Inpatient Hospital Stay (HOSPITAL_COMMUNITY): Payer: BLUE CROSS/BLUE SHIELD

## 2018-04-30 DIAGNOSIS — R918 Other nonspecific abnormal finding of lung field: Secondary | ICD-10-CM

## 2018-04-30 DIAGNOSIS — G939 Disorder of brain, unspecified: Secondary | ICD-10-CM

## 2018-04-30 LAB — CBC WITH DIFFERENTIAL/PLATELET
Abs Immature Granulocytes: 0.2 10*3/uL — ABNORMAL HIGH (ref 0.0–0.1)
Basophils Absolute: 0 10*3/uL (ref 0.0–0.1)
Basophils Relative: 0 %
Eosinophils Absolute: 0.1 10*3/uL (ref 0.0–0.7)
Eosinophils Relative: 0 %
HCT: 39 % (ref 36.0–46.0)
Hemoglobin: 12.5 g/dL (ref 12.0–15.0)
Immature Granulocytes: 1 %
Lymphocytes Relative: 4 %
Lymphs Abs: 1 10*3/uL (ref 0.7–4.0)
MCH: 29.3 pg (ref 26.0–34.0)
MCHC: 32.1 g/dL (ref 30.0–36.0)
MCV: 91.3 fL (ref 78.0–100.0)
Monocytes Absolute: 1.1 10*3/uL — ABNORMAL HIGH (ref 0.1–1.0)
Monocytes Relative: 5 %
Neutro Abs: 20.7 10*3/uL — ABNORMAL HIGH (ref 1.7–7.7)
Neutrophils Relative %: 90 %
Platelets: 399 10*3/uL (ref 150–400)
RBC: 4.27 MIL/uL (ref 3.87–5.11)
RDW: 12.2 % (ref 11.5–15.5)
WBC: 23.1 10*3/uL — ABNORMAL HIGH (ref 4.0–10.5)

## 2018-04-30 LAB — BASIC METABOLIC PANEL
Anion gap: 10 (ref 5–15)
BUN: 9 mg/dL (ref 6–20)
CO2: 29 mmol/L (ref 22–32)
Calcium: 9.4 mg/dL (ref 8.9–10.3)
Chloride: 101 mmol/L (ref 101–111)
Creatinine, Ser: 0.87 mg/dL (ref 0.44–1.00)
GFR calc Af Amer: >60 mL/min (ref >60)
GFR calc non Af Amer: >60 mL/min (ref >60)
Glucose, Bld: 127 mg/dL — ABNORMAL HIGH (ref 65–99)
Potassium: 4 mmol/L (ref 3.5–5.1)
Sodium: 140 mmol/L (ref 135–145)

## 2018-04-30 MED ORDER — DEXAMETHASONE 4 MG PO TABS
4.0000 mg | ORAL_TABLET | Freq: Four times a day (QID) | ORAL | Status: AC
  Administered 2018-05-01: 4 mg via ORAL
  Filled 2018-04-30: qty 1

## 2018-04-30 MED ORDER — DEXAMETHASONE 4 MG PO TABS
4.0000 mg | ORAL_TABLET | Freq: Three times a day (TID) | ORAL | Status: DC
  Administered 2018-05-01 – 2018-05-02 (×4): 4 mg via ORAL
  Filled 2018-04-30 (×4): qty 1

## 2018-04-30 MED ORDER — GADOBENATE DIMEGLUMINE 529 MG/ML IV SOLN
13.0000 mL | Freq: Once | INTRAVENOUS | Status: AC | PRN
  Administered 2018-04-30: 13 mL via INTRAVENOUS

## 2018-04-30 NOTE — Progress Notes (Signed)
Patient s/p craniotomy and transferred to ICU under neurosurgery care. Will sign off. If medicine needed, please re-consult. Thanks.  Cordelia Poche, MD [facility] 04/30/2018, 7:06 AM Pager: [phone]

## 2018-04-30 NOTE — Progress Notes (Signed)
Subjective: Patient reports Doing well minimal headache  Objective: Vital signs in last 24 hours: Temp:  [97.7 F (36.5 C)-98.5 F (36.9 C)] 98.4 F (36.9 C) (05/18 0400) Pulse Rate:  [47-89] 54 (05/18 0700) Resp:  [6-18] 15 (05/18 0700) BP: (111-141)/(73-95) 120/79 (05/18 0700) SpO2:  [94 %-100 %] 99 % (05/18 0700) Arterial Line BP: (125-147)/(74-88) 138/79 (05/17 1600) Weight:  [64.4 kg (142 lb)] 64.4 kg (142 lb) (05/17 0904)  Intake/Output from previous day: 05/17 0701 - 05/18 0700 In: 2230 [P.O.:480; I.V.:1500; IV Piggyback:250] Out: 2375 [Urine:2275; Blood:100] Intake/Output this shift: No intake/output data recorded.  Patient is awake and alert pupils equal she is neurologically intact dressing has some mild amount of saturation.  Lab Results: Recent Labs    04/29/18 0422 04/30/18 0350  WBC 18.1* 23.1*  HGB 12.3 12.5  HCT 37.7 39.0  PLT 385 399   BMET Recent Labs    04/29/18 0422 04/30/18 0350  NA 140 140  K 4.1 4.0  CL 106 101  CO2 28 29  GLUCOSE 101* 127*  BUN 12 9  CREATININE 0.75 0.87  CALCIUM 8.9 9.4    Studies/Results: No results found.  Assessment/Plan: Mobilized today with physical and occupational therapy MRI follow-up pending  LOS: 4 days     Roshan Roback P 04/30/2018, 7:23 AM

## 2018-05-01 MED ORDER — FAMOTIDINE 20 MG PO TABS
20.0000 mg | ORAL_TABLET | Freq: Two times a day (BID) | ORAL | Status: DC
  Administered 2018-05-01 – 2018-05-02 (×3): 20 mg via ORAL
  Filled 2018-05-01 (×3): qty 1

## 2018-05-01 NOTE — Progress Notes (Addendum)
Patient arrived to unit via walking from 4N32 to 4N13 with belongings and family, oriented to room/unit.  Vitals stable, questions answered.  Telemetry applied and verified.  Continue to monitor patient.

## 2018-05-01 NOTE — Progress Notes (Signed)
Subjective: Patient reports Overall doing well headache well-controlled  Objective: Vital signs in last 24 hours: Temp:  [97.4 F (36.3 C)-98.6 F (37 C)] 97.4 F (36.3 C) (05/19 0400) Pulse Rate:  [45-75] 47 (05/19 0700) Resp:  [10-21] 16 (05/19 0700) BP: (95-123)/(54-91) 101/54 (05/19 0700) SpO2:  [96 %-100 %] 98 % (05/19 0700)  Intake/Output from previous day: 05/18 0701 - 05/19 0700 In: 240 [P.O.:240] Out: -  Intake/Output this shift: No intake/output data recorded.  Awake alert oriented strength 5 out of 5 mild separation of the stocking  Lab Results: Recent Labs    04/29/18 0422 04/30/18 0350  WBC 18.1* 23.1*  HGB 12.3 12.5  HCT 37.7 39.0  PLT 385 399   BMET Recent Labs    04/29/18 0422 04/30/18 0350  NA 140 140  K 4.1 4.0  CL 106 101  CO2 28 29  GLUCOSE 101* 127*  BUN 12 9  CREATININE 0.75 0.87  CALCIUM 8.9 9.4    Studies/Results: Mr Jeri Cos Wo Contrast  Result Date: 05/01/2018 CLINICAL DATA:  Follow-up examination status post tumor resection. EXAM: MRI HEAD WITHOUT AND WITH CONTRAST TECHNIQUE: Multiplanar, multiecho pulse sequences of the brain and surrounding structures were obtained without and with intravenous contrast. CONTRAST:  29m MULTIHANCE GADOBENATE DIMEGLUMINE 529 MG/ML IV SOLN COMPARISON:  Prior MRI from 04/26/2018. FINDINGS: Brain: Postoperative changes from interval left temporal craniotomy for tumor resection are seen. Small postoperative extra-axial collection measuring up to 4 mm overlies the anterior left temporal pole. Previously seen left temporal lobe mass has been resected, with postoperative blood products seen within the resection cavity. Fairly smooth serpiginous enhancement seen at the deep and posterior aspect of the resection cavity favored to be postoperative in nature (series 17001, image 67 no significant nodular or masslike enhancement about the resection cavity to suggest residual tumor identified. Resection margins are well  demarcated with no evidence for Peri resection infarct or other complication. Persistent vasogenic edema with regional mass effect throughout the left frontotemporal region with partial effacement of the left lateral ventricle, overall slightly improved from previous with improved 5 mm of left-to-right shift (8 mm on preoperative MRI from 04/26/2018. Persistent mild crowding of the basilar cisterns which remain patent. Remainder the brain is otherwise stable in appearance. No evidence for acute infarct. No other mass lesion. No hydrocephalus or ventricular trapping. No other extra-axial fluid collection. Vascular: Major intracranial vascular flow voids are maintained. Skull and upper cervical spine: Craniocervical junction normal. Upper cervical spine normal. Post craniotomy changes present at the left temporal calvarium. Overlying postoperative scalp swelling and edema. Sinuses/Orbits: Globes and orbital soft tissues within normal limits. Paranasal sinuses are clear. No mastoid effusion. Inner ear structures normal. Other: None. IMPRESSION: 1. Postoperative changes from interval left temporal craniotomy for tumor resection without complication. There has been gross total resection of the left temporal lobe mass, with no appreciable residual tumor identified. 2. Persistent but improved vasogenic edema throughout the left frontotemporal region with persistent 5 mm of left-to-right midline shift. 3. Otherwise stable and unremarkable MRI of the brain. Electronically Signed   By: BJeannine BogaM.D.   On: 05/01/2018 02:08    Assessment/Plan: Doing very well transferred to progressive. MRI scan shows gross total resection of left temporal mass. Wean Decadron  LOS: 5 days     Leslie [name] P 05/01/2018, 8:03 AM

## 2018-05-02 ENCOUNTER — Encounter: Payer: Self-pay | Admitting: Radiation Oncology

## 2018-05-02 ENCOUNTER — Ambulatory Visit
Admit: 2018-05-02 | Discharge: 2018-05-02 | Disposition: A | Discharge status: Home / Self Care | Payer: BLUE CROSS/BLUE SHIELD | Source: Ambulatory Visit | Attending: Radiation Oncology | Admitting: Radiation Oncology

## 2018-05-02 ENCOUNTER — Other Ambulatory Visit: Payer: Self-pay | Admitting: Radiation Therapy

## 2018-05-02 ENCOUNTER — Encounter (HOSPITAL_COMMUNITY): Payer: Self-pay | Admitting: Neurosurgery

## 2018-05-02 DIAGNOSIS — C3432 Malignant neoplasm of lower lobe, left bronchus or lung: Secondary | ICD-10-CM | POA: Insufficient documentation

## 2018-05-02 DIAGNOSIS — C7931 Secondary malignant neoplasm of brain: Secondary | ICD-10-CM | POA: Insufficient documentation

## 2018-05-02 LAB — AEROBIC/ANAEROBIC CULTURE (SURGICAL/DEEP WOUND): Culture: NO GROWTH

## 2018-05-02 LAB — AEROBIC/ANAEROBIC CULTURE W GRAM STAIN (SURGICAL/DEEP WOUND)

## 2018-05-02 MED ORDER — DEXAMETHASONE 4 MG PO TABS: 1.0000 mg | ORAL_TABLET | Freq: Three times a day (TID) | ORAL | 2 refills | Status: DC

## 2018-05-02 MED ORDER — HYDROCODONE-ACETAMINOPHEN 5-325 MG PO TABS: 1.0000 | ORAL_TABLET | ORAL | 0 refills | Status: DC | PRN

## 2018-05-02 MED FILL — Thrombin For Soln 20000 Unit: CUTANEOUS | Qty: 1 | Status: AC

## 2018-05-02 MED FILL — Thrombin For Soln 5000 Unit: CUTANEOUS | Qty: 5000 | Status: AC

## 2018-05-02 NOTE — Discharge Instructions (Signed)

## 2018-05-02 NOTE — Progress Notes (Signed)
Per Annette Stable, MD, pt to meet with oncology this afternoon before d/c. Continue to monitor until that time.

## 2018-05-02 NOTE — Consult Note (Signed)
Radiation Oncology         [phone] ________________________________  Name: Leslie [name]        MRN: [id number]  Date of Service: 05/02/18 DOB: 11-Aug-1976  PT:WSFKCL, Pcp Not In  No ref. provider found     REFERRING PHYSICIAN: No ref. provider found   DIAGNOSIS: The primary encounter diagnosis was Brain metastasis ([place]). Diagnoses of Cavitating mass in left upper lung lobe, Status post biopsy, and Malignant neoplasm of lower lobe of left lung ([place]) were also pertinent to this visit.   HISTORY OF PRESENT ILLNESS: Leslie [name] is a 42 y.o. female seen at the request of Dr. Annette Stable for a newly diagnosted lung cancer. The patient was brought in by her significant other after several days of not feeling well and difficulty with incomplete speech with word finding. She had been taking OTC meds for headache the whole week prior and once brought to the ED imaging of her brain revealed a large left temporal lesion measuring 3 x 2.7 cm. She had additional staging imaging that reveals a 5.6 x 3.9 cm mass in the lingula of the left lung, and a 9 mm node adjacent to the aortic arch was seen. 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 She also underwent left craniotomy with resection of her left temporal tumor and her pathology is pending. We are asked to see her to discuss treatment recommendations.    PREVIOUS RADIATION THERAPY: No   PAST MEDICAL HISTORY:  Past Medical History:  Diagnosis Date  . Migraines   . Yeast infection        PAST SURGICAL HISTORY:History reviewed. No pertinent surgical history.   FAMILY HISTORY:  Family History  Problem Relation Age of Onset  . Cancer Father        lung     SOCIAL HISTORY:  reports that she has never smoked. She has never used smokeless tobacco. She reports that she drinks alcohol. She reports that she does not use  drugs.   ALLERGIES: Patient has no known allergies.   MEDICATIONS:  Current Facility-Administered Medications  Medication Dose Route Frequency Provider Last Rate Last Dose  . dexamethasone (DECADRON) tablet 4 mg  4 mg Oral Q8H Kary Kos, MD   4 mg at 05/02/18 0548  . famotidine (PEPCID) tablet 20 mg  20 mg Oral BID Kary Kos, MD   20 mg at 05/02/18 0939  . HYDROcodone-acetaminophen (NORCO/VICODIN) 5-325 MG per tablet 1-2 tablet  1-2 tablet Oral Q4H PRN Kary Kos, MD   2 tablet at 04/29/18 1516  . HYDROmorphone (DILAUDID) injection 0.5 mg  0.5 mg Intravenous Q2H PRN Kary Kos, MD   0.5 mg at 04/29/18 1627  . labetalol (NORMODYNE,TRANDATE) injection 10-40 mg  10-40 mg Intravenous Q10 min PRN Kary Kos, MD      . ondansetron [facility]) tablet 4 mg  4 mg Oral Q4H PRN Kary Kos, MD       Or  . ondansetron [facility]) injection 4 mg  4 mg Intravenous Q4H PRN Kary Kos, MD      . promethazine (PHENERGAN) tablet 12.5-25 mg  12.5-25 mg Oral Q4H PRN Kary Kos, MD         REVIEW OF SYSTEMS: On review of systems, the patient reports that she is doing well since surgery and her speech is improved. She still has word finding difficulties but does not have any changes in movement  or changes in consciousness. She denies any chest pain, shortness of breath, cough, fevers, chills, night sweats, unintended weight changes. She denies any bowel or bladder disturbances, and denies abdominal pain, nausea or vomiting. She reports no headache today and as a result of no headache yesterday was able to sleep through the night for the first time in over a week. She denies any new musculoskeletal or joint aches or pains. A complete review of systems is obtained and is otherwise negative.     PHYSICAL EXAM:  Wt Readings from Last 3 Encounters:  04/29/18 142 lb (64.4 kg)  12/16/12 130 lb (59 kg)   Temp Readings from Last 3 Encounters:  05/02/18 97.8 F (36.6 C)   BP Readings from Last 3 Encounters:  05/02/18  114/70  12/16/12 110/68   Pulse Readings from Last 3 Encounters:  05/02/18 69  12/16/12 72   Pain Assessment Pain Score: 0-No pain/10  In general this is a well appearing caucasian female in no acute distress. She is alert and oriented x4 and appropriate throughout the examination. HEENT reveals that the patient is normocephalic, atraumatic. EOMs are intact. PERRLA. Skin is intact without any evidence of gross lesions, and her surgical site along her left temple is healing well with intact suture.  Cardiopulmonary assessment is negative for acute distress and she exhibits normal effort. She does have word finding difficulty at times, but is aware of this, and otherwise is neurolgically intact grossly.   ECOG = 1  0 - Asymptomatic (Fully active, able to carry on all predisease activities without restriction)  1 - Symptomatic but completely ambulatory (Restricted in physically strenuous activity but ambulatory and able to carry out work of a light or sedentary nature. For example, light housework, office work)  2 - Symptomatic, <50% in bed during the day (Ambulatory and capable of all self care but unable to carry out any work activities. Up and about more than 50% of waking hours)  3 - Symptomatic, >50% in bed, but not bedbound (Capable of only limited self-care, confined to bed or chair 50% or more of waking hours)  4 - Bedbound (Completely disabled. Cannot carry on any self-care. Totally confined to bed or chair)  5 - Death   Eustace Pen MM, Creech RH, Tormey DC, et al. [phone]). "Toxicity and response criteria of the [facility]". [place] Oncol. 5 (6): 649-55    LABORATORY DATA:  Lab Results  Component Value Date   WBC 23.1 (H) 04/30/2018   HGB 12.5 04/30/2018   HCT 39.0 04/30/2018   MCV 91.3 04/30/2018   PLT 399 04/30/2018   Lab Results  Component Value Date   NA 140 04/30/2018   K 4.0 04/30/2018   CL 101 04/30/2018   CO2 29 04/30/2018   Lab  Results  Component Value Date   ALT 17 04/25/2018   AST 20 04/25/2018   ALKPHOS 93 04/25/2018   BILITOT 0.3 04/25/2018      RADIOGRAPHY: Dg Chest 2 View  Result Date: 04/25/2018 CLINICAL DATA:  Vomiting, body aches and headache. EXAM: CHEST - 2 VIEW COMPARISON:  None. FINDINGS: Heart size and mediastinal contours are within normal limits. Rounded mass overlies the LEFT lower lung, measuring 5 cm greatest dimension, most likely LEFT lower lobe as there is some associated obscuration of the LEFT heart border, less discretely seen on the lateral view but likely in the lingula based on the lateral view. RIGHT lung is clear. No pleural effusion or  pneumothorax seen. Osseous structures about the chest are unremarkable. IMPRESSION: Rounded mass overlying the LEFT lung base, measuring 5 cm greatest dimension, most likely within the lingula, less likely within the breast or other site outside the chest. Favor primary lung cancer. Recommend chest CT with contrast for further characterization. These results were called by telephone at the time of interpretation on 04/25/2018 at 11:38 pm to Dr. Shirlyn Goltz , who verbally acknowledged these results. Electronically Signed   By: Franki Cabot M.D.   On: 04/25/2018 23:39   Ct Head Wo Contrast  Result Date: 04/25/2018 CLINICAL DATA:  Vomiting all week in, body aches and headache. EXAM: CT HEAD WITHOUT CONTRAST TECHNIQUE: Contiguous axial images were obtained from the base of the skull through the vertex without intravenous contrast. COMPARISON:  None. FINDINGS: Brain: Mass within the LEFT temporal lobe measures approximately 2.8 cm, with extensive surrounding vasogenic edema which extends upwards into the LEFT frontoparietal lobe above the level of the LEFT lateral ventricle. There is associated mass effect with effacement of the LEFT lateral ventricle and a rightward midline shift which measures approximately 10 mm. No parenchymal hemorrhage or extra-axial hemorrhage  identified. Vascular: No hyperdense vessel or unexpected calcification. Skull: Normal. Negative for fracture or focal lesion. Sinuses/Orbits: No acute finding. Other: None. IMPRESSION: Mass within the LEFT temporal lobe, almost certainly neoplastic, with associated large amount of vasogenic edema which extends upwards from the LEFT temporal lobe into the LEFT frontoparietal lobe at and just above the level of the lateral ventricles. Associated mass effect with effacement of the LEFT lateral ventricle and rightward midline shift measuring approximately 10 mm. Also suspect some degree of LEFT-sided uncal herniation. No evidence of tonsillar or transtentorial herniation. No intracranial hemorrhage. Recommend brain MRI with contrast for further characterization. These results and recommendations were called by telephone at the time of interpretation on 04/25/2018 at 11:30 pm to Dr. Geanie Kenning , who verbally acknowledged these results. Electronically Signed   By: Franki Cabot M.D.   On: 04/25/2018 23:33   Ct Chest W Contrast  Result Date: 04/26/2018 CLINICAL DATA:  Nausea vomiting with body ache EXAM: CT CHEST, ABDOMEN, AND PELVIS WITH CONTRAST TECHNIQUE: Multidetector CT imaging of the chest, abdomen and pelvis was performed following the standard protocol during bolus administration of intravenous contrast. CONTRAST:  129m ISOVUE-300 IOPAMIDOL (ISOVUE-300) INJECTION 61% COMPARISON:  Chest x-ray 04/25/2018 FINDINGS: CT CHEST FINDINGS Cardiovascular: Nonaneurysmal aorta. No pericardial effusion. Normal heart size. Mediastinum/Nodes: Subcentimeter hypodensity right lobe of thyroid. Midline trachea. 9 mm lymph node adjacent to the anterior arch. Esophagus within normal limits. Lungs/Pleura: No pleural effusion or pneumothorax. 5.6 x 3.9 cm heterogenous mass within the lingula. Musculoskeletal: No acute or suspicious abnormality. CT ABDOMEN PELVIS FINDINGS Hepatobiliary: No focal liver abnormality is seen. No  gallstones, gallbladder wall thickening, or biliary dilatation. Pancreas: Unremarkable. No pancreatic ductal dilatation or surrounding inflammatory changes. Spleen: Normal in size without focal abnormality. Adrenals/Urinary Tract: Adrenal glands are unremarkable. Kidneys are normal, without renal calculi, focal lesion, or hydronephrosis. Bladder is unremarkable. Stomach/Bowel: Stomach is within normal limits. Appendix appears normal. No evidence of bowel wall thickening, distention, or inflammatory changes. Vascular/Lymphatic: Nonaneurysmal aorta.  No significant adenopathy. Reproductive: Uterus and bilateral adnexa are unremarkable. Other: Small free fluid in the pelvis.  No free air. Musculoskeletal: No acute or significant osseous findings. IMPRESSION: 1. 5.6 x 3.9 cm heterogenous solid enhancing mass in the left lower lung. Mass is not separable from the pericardium and appears to displace surrounding bronchi and  left pulmonary fissure, suggesting that this represents a pericardial mass as opposed to an intraparenchymal lung mass. 2. Few prominent lymph nodes adjacent to the aortic arch. 3. No CT evidence for acute intra-abdominal or pelvic abnormality. Small amount of free fluid in the pelvis. Electronically Signed   By: Donavan Foil M.D.   On: 04/26/2018 01:59   Mr Jeri Cos ST Contrast  Result Date: 05/01/2018 CLINICAL DATA:  Follow-up examination status post tumor resection. EXAM: MRI HEAD WITHOUT AND WITH CONTRAST TECHNIQUE: Multiplanar, multiecho pulse sequences of the brain and surrounding structures were obtained without and with intravenous contrast. CONTRAST:  60m MULTIHANCE GADOBENATE DIMEGLUMINE 529 MG/ML IV SOLN COMPARISON:  Prior MRI from 04/26/2018. FINDINGS: Brain: Postoperative changes from interval left temporal craniotomy for tumor resection are seen. Small postoperative extra-axial collection measuring up to 4 mm overlies the anterior left temporal pole. Previously seen left temporal lobe  mass has been resected, with postoperative blood products seen within the resection cavity. Fairly smooth serpiginous enhancement seen at the deep and posterior aspect of the resection cavity favored to be postoperative in nature (series 17001, image 67 no significant nodular or masslike enhancement about the resection cavity to suggest residual tumor identified. Resection margins are well demarcated with no evidence for Peri resection infarct or other complication. Persistent vasogenic edema with regional mass effect throughout the left frontotemporal region with partial effacement of the left lateral ventricle, overall slightly improved from previous with improved 5 mm of left-to-right shift (8 mm on preoperative MRI from 04/26/2018. Persistent mild crowding of the basilar cisterns which remain patent. Remainder the brain is otherwise stable in appearance. No evidence for acute infarct. No other mass lesion. No hydrocephalus or ventricular trapping. No other extra-axial fluid collection. Vascular: Major intracranial vascular flow voids are maintained. Skull and upper cervical spine: Craniocervical junction normal. Upper cervical spine normal. Post craniotomy changes present at the left temporal calvarium. Overlying postoperative scalp swelling and edema. Sinuses/Orbits: Globes and orbital soft tissues within normal limits. Paranasal sinuses are clear. No mastoid effusion. Inner ear structures normal. Other: None. IMPRESSION: 1. Postoperative changes from interval left temporal craniotomy for tumor resection without complication. There has been gross total resection of the left temporal lobe mass, with no appreciable residual tumor identified. 2. Persistent but improved vasogenic edema throughout the left frontotemporal region with persistent 5 mm of left-to-right midline shift. 3. Otherwise stable and unremarkable MRI of the brain. Electronically Signed   By: BJeannine BogaM.D.   On: 05/01/2018 02:08   Mr  BJeri CosWMHContrast  Result Date: 04/26/2018 CLINICAL DATA:  Initial evaluation for intracranial tumor. EXAM: MRI HEAD WITHOUT AND WITH CONTRAST TECHNIQUE: Multiplanar, multiecho pulse sequences of the brain and surrounding structures were obtained without and with intravenous contrast. CONTRAST:  161mMULTIHANCE GADOBENATE DIMEGLUMINE 529 MG/ML IV SOLN COMPARISON:  Prior head CT from 04/25/2018 as well as prior chest CT from earlier the same day. FINDINGS: Brain: Previously identified mass positioned at the anterior left temporal lobe again seen, measuring 3.0 x 2.7 x 2.6 cm (AP by transverse by craniocaudad). Heterogeneous T2 signal abnormality with scattered foci of susceptibility artifact within the central aspect of the lesion consistent with necrosis. Lesion demonstrates avid postcontrast rim enhancement. Lesion is fairly well demarcated anteriorly and medially, but demonstrates somewhat infiltrative enhancement along its lateral and posterior aspect (series 11, image 71). Enhancement closely approximates the adjacent temporal horn of the left lateral ventricle without definite ependymal enhancement or intraventricular extension. Associated extensive vasogenic  edema and regional mass effect throughout the left frontotemporal region with extension into the left cerebral peduncle and left midbrain. Mass effect on the left lateral ventricle which is partially effaced. Associated 8 mm of left-to-right midline shift with crowding of the basilar cisterns. No hydrocephalus or frank ventricular trapping. No other mass lesions or abnormal enhancement within the brain. Remainder of the brain is normal in appearance. No acute infarct. No other mass lesion or abnormal enhancement. No extra-axial fluid collection. Major dural sinuses are grossly patent. Pituitary gland suprasellar region normal. Vascular: Major intracranial vascular flow voids are well maintained. Skull and upper cervical spine: Craniocervical junction  within normal limits. Upper cervical spine normal. Visualized osseous structures within normal limits. No discrete osseous lesions. Scalp soft tissues unremarkable. Sinuses/Orbits: Globes and orbital soft tissues within normal limits. Small right maxillary sinus retention cyst. Paranasal sinuses are otherwise clear. No mastoid effusion. Inner ear structures grossly normal. Other: None. IMPRESSION: 1. 3.0 x 2.7 x 2.6 cm rim enhancing mass at the anterior left temporal lobe. 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 2. Associated vasogenic edema with regional mass effect and 8 mm of left-to-right midline shift. Electronically Signed   By: Jeannine Boga M.D.   On: 04/26/2018 15:09   Ct Abdomen Pelvis W Contrast  Result Date: 04/26/2018 CLINICAL DATA:  Nausea vomiting with body ache EXAM: CT CHEST, ABDOMEN, AND PELVIS WITH CONTRAST TECHNIQUE: Multidetector CT imaging of the chest, abdomen and pelvis was performed following the standard protocol during bolus administration of intravenous contrast. CONTRAST:  136m ISOVUE-300 IOPAMIDOL (ISOVUE-300) INJECTION 61% COMPARISON:  Chest x-ray 04/25/2018 FINDINGS: CT CHEST FINDINGS Cardiovascular: Nonaneurysmal aorta. No pericardial effusion. Normal heart size. Mediastinum/Nodes: Subcentimeter hypodensity right lobe of thyroid. Midline trachea. 9 mm lymph node adjacent to the anterior arch. Esophagus within normal limits. Lungs/Pleura: No pleural effusion or pneumothorax. 5.6 x 3.9 cm heterogenous mass within the lingula. Musculoskeletal: No acute or suspicious abnormality. CT ABDOMEN PELVIS FINDINGS Hepatobiliary: No focal liver abnormality is seen. No gallstones, gallbladder wall thickening, or biliary dilatation. Pancreas: Unremarkable. No pancreatic ductal dilatation or surrounding inflammatory changes. Spleen: Normal in size without focal abnormality. Adrenals/Urinary Tract: Adrenal glands are unremarkable. Kidneys are  normal, without renal calculi, focal lesion, or hydronephrosis. Bladder is unremarkable. Stomach/Bowel: Stomach is within normal limits. Appendix appears normal. No evidence of bowel wall thickening, distention, or inflammatory changes. Vascular/Lymphatic: Nonaneurysmal aorta.  No significant adenopathy. Reproductive: Uterus and bilateral adnexa are unremarkable. Other: Small free fluid in the pelvis.  No free air. Musculoskeletal: No acute or significant osseous findings. IMPRESSION: 1. 5.6 x 3.9 cm heterogenous solid enhancing mass in the left lower lung. Mass is not separable from the pericardium and appears to displace surrounding bronchi and left pulmonary fissure, suggesting that this represents a pericardial mass as opposed to an intraparenchymal lung mass. 2. Few prominent lymph nodes adjacent to the aortic arch. 3. No CT evidence for acute intra-abdominal or pelvic abnormality. Small amount of free fluid in the pelvis. Electronically Signed   By: KDonavan FoilM.D.   On: 04/26/2018 01:59   Ct Biopsy  Result Date: 04/27/2018 INDICATION: No known primary, now with left upper lobe/lingular mass and brain lesion worrisome for metastatic bronchogenic carcinoma versus infection. Please perform CT-guided biopsy for tissue diagnostic purposes. EXAM: CT-GUIDED BIOPSY OF LEFT UPPER LOBE PULMONARY MASS COMPARISON:  CT the chest, abdomen and pelvis - 04/26/2018 MEDICATIONS: None. ANESTHESIA/SEDATION: Fentanyl 50 mcg IV; Versed 1 mg IV Sedation  time: 14 minutes; The patient was continuously monitored during the procedure by the interventional radiology nurse under my direct supervision. CONTRAST:  None COMPLICATIONS: None immediate. PROCEDURE: Informed consent was obtained from the patient following an explanation of the procedure, risks, benefits and alternatives. The patient understands,agrees and consents for the procedure. All questions were addressed. A time out was performed prior to the initiation of the  procedure. The patient was positioned supine, slightly RPO on the CT table and a limited chest CT was performed for procedural planning demonstrating no change to slight increase in size of the now approximately 5.8 x 4.1 cm mass within the left upper lobe crossing the left major fissure to the lingula, previously, 5.6 x 3.9 cm. The operative site was prepped and draped in the usual sterile fashion. Under sterile conditions and local anesthesia, a 17 gauge coaxial needle was advanced into the peripheral aspect of the nodule. Positioning was confirmed with intermittent CT fluoroscopy and followed by the acquisition of 4 core needle biopsies with an 18 gauge core needle biopsy device. Samples were set aside for both surgical pathologic as well as gram stain analysis. The coaxial needle was removed as approximately 2 cc of bloody fluid was aspirated. All aspirated fluid is capped and sent to the laboratory for analysis. Superficial hemostasis was achieved with manual compression. Limited post procedural chest CT was negative for pneumothorax or additional complication. A dressing was placed. The patient tolerated the procedure well without immediate postprocedural complication. The patient was escorted to have an upright chest radiograph. IMPRESSION: Technically successful CT guided core needle core biopsy of indeterminate left upper lobe/lingular mass. Samples were sent both for surgical pathologic as well as Gram stain analysis. Electronically Signed   By: Sandi Mariscal M.D.   On: 04/27/2018 16:36   Dg Chest Port 1 View  Result Date: 04/27/2018 CLINICAL DATA:  Post CT guided Biopsy of left upper pulmonary nodule/mass EXAM: PORTABLE CHEST 1 VIEW COMPARISON:  Chest x-ray dated 04/25/2018 and chest CT from earlier today. FINDINGS: Again noted is the rounded mass at the LEFT lung base, unchanged in the short-term interval. No pleural effusion or pneumothorax appreciated status post today's CT-guided biopsy. Heart size  and mediastinal contours are stable. IMPRESSION: No pleural effusion or pneumothorax seen status post today's CT-guided lung biopsy. Electronically Signed   By: Franki Cabot M.D.   On: 04/27/2018 17:22       IMPRESSION/PLAN: 1. 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 We discussed the findings and work up [employer] and outlined the rationale for continued work up including PET, repeat 3T MRI of the brain for planning purposes, and the role of stereotactic radiosurgery in the postop setting. She will proceed with the remainder of this work up as an outpatient. We discussed the risks, benefits, short, and long term effects of radiotherapy, and the patient is interested in proceeding. I discussed the delivery and logistics of radiotherapy and anticipate that once Dr. Lisbeth Renshaw has reviewed her films he would recommend 1-3 fractions of SRS radiotherapy. We will see her back in the office for simulation and to review her MRI which will be scheduled by our brain navigator. Her tumor will also be sent for molecular studies and she will see Dr. Julien Nordmann next week in the office. I will order her PET scan as an outpatient as well to complete her staging work up.  In a visit lasting 70 minutes, greater than 50% of  the time was spent face to face discussing the findings, and in floor time coordinating the patient's care.     Carola Rhine, PAC

## 2018-05-02 NOTE — Discharge Summary (Signed)
Physician Discharge Summary  Patient ID: Leslie [name] MRN: [id number] DOB/AGE: 04/02/1976 42 y.o.  Admit date: 04/25/2018 Discharge date: 05/02/2018  Admission Diagnoses:  Discharge Diagnoses:  Active Problems:   Brain mass   Lung mass   S/P craniotomy   Discharged Condition: good  Hospital Course: Patient admitted to the hospital with a newly discovered left temporal lobe lesion.  Patient also underwent work-up which demonstrated evidence of a solitary left lung mass.  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  Patient underwent left-sided temporal craniotomy and gross total resection of her tumor.  Postoperatively doing very well.  No headache.  Neuro neurologic symptoms.  Wound healing well.  Ready for discharge home.  Plan for outpatient adjunctive therapies once final diagnosis better established by pathology.  Consults:   Significant Diagnostic Studies:   Treatments:   Discharge Exam: Blood pressure 114/70, pulse 69, temperature 97.8 F (36.6 C), resp. rate 16, height 5' 9"  (1.753 m), weight 64.4 kg (142 lb), last menstrual period 04/18/2018, SpO2 100 %, unknown if currently breastfeeding. Awake and alert.  Oriented and appropriate.  Cranial nerve function intact.  Motor and sensory function extremities normal.  Wound clean and dry.  Chest and abdomen benign.  Disposition: Discharge disposition: 01-Home or Self Care        Allergies as of 05/02/2018   No Known Allergies     Medication List    TAKE these medications   dexamethasone 4 MG tablet Commonly known as:  DECADRON Take 0.5 tablets (2 mg total) by mouth every 8 (eight) hours.   HYDROcodone-acetaminophen 5-325 MG tablet Commonly known as:  NORCO/VICODIN Take 1-2 tablets by mouth every 4 (four) hours as needed for moderate pain.      Follow-up Information    Earnie Larsson, MD. Schedule an appointment as soon as possible for a visit in 1 week(s).   Specialty:  Neurosurgery Contact  information: [address] [place] [address] [phone]           Signed: Charlie Pitter 05/02/2018, 11:54 AM

## 2018-05-02 NOTE — Progress Notes (Signed)
Patient discharge instructions given to patient including printed prescriptions. Questions answered. IV removed.  All belongings with patient at time of d/c.  Patient taken to car via wheelchair.

## 2018-05-03 ENCOUNTER — Other Ambulatory Visit: Payer: Self-pay | Admitting: Radiation Oncology

## 2018-05-03 ENCOUNTER — Other Ambulatory Visit: Payer: Self-pay | Admitting: Radiation Therapy

## 2018-05-03 DIAGNOSIS — C3490 Malignant neoplasm of unspecified part of unspecified bronchus or lung: Secondary | ICD-10-CM

## 2018-05-03 DIAGNOSIS — C3432 Malignant neoplasm of lower lobe, left bronchus or lung: Secondary | ICD-10-CM

## 2018-05-03 DIAGNOSIS — C7949 Secondary malignant neoplasm of other parts of nervous system: Principal | ICD-10-CM

## 2018-05-03 DIAGNOSIS — C7931 Secondary malignant neoplasm of brain: Secondary | ICD-10-CM

## 2018-05-04 ENCOUNTER — Telehealth: Payer: Self-pay | Admitting: *Deleted

## 2018-05-04 ENCOUNTER — Encounter: Payer: Self-pay | Admitting: Radiation Therapy

## 2018-05-04 NOTE — Telephone Encounter (Signed)
Called patient to inform of Pet Scan for 05-13-18- arrival time- 7:30 am @ [facility], pt. to be NPO-@ midnight, spoke with patient and she is aware of this test

## 2018-05-04 NOTE — Progress Notes (Signed)
Leslie [name] requested a letter to excuse her from the 6/11 jury duty she was asked to serve. A letter has been printed and left with Darryll Capers in the [place] lobby for Leslie [name] to come and get at her leisure.   Mont Dutton R.T. (R).(T). Special Procedures Navigator  Radiation Oncology

## 2018-05-05 ENCOUNTER — Ambulatory Visit
Admission: RE | Admit: 2018-05-05 | Discharge: 2018-05-05 | Disposition: A | Discharge status: Home / Self Care | Payer: BLUE CROSS/BLUE SHIELD | Source: Ambulatory Visit | Attending: Radiation Oncology | Admitting: Radiation Oncology

## 2018-05-05 ENCOUNTER — Encounter: Payer: Self-pay | Admitting: Radiation Oncology

## 2018-05-05 ENCOUNTER — Other Ambulatory Visit: Payer: Self-pay

## 2018-05-05 DIAGNOSIS — Z9089 Acquired absence of other organs: Secondary | ICD-10-CM | POA: Diagnosis not present

## 2018-05-05 DIAGNOSIS — C7931 Secondary malignant neoplasm of brain: Secondary | ICD-10-CM | POA: Diagnosis not present

## 2018-05-05 DIAGNOSIS — C3412 Malignant neoplasm of upper lobe, left bronchus or lung: Secondary | ICD-10-CM | POA: Diagnosis not present

## 2018-05-05 DIAGNOSIS — Z7952 Long term (current) use of systemic steroids: Secondary | ICD-10-CM | POA: Diagnosis not present

## 2018-05-05 NOTE — Progress Notes (Signed)
Radiation Oncology         [phone] ________________________________  Name: Leslie [name]        MRN: [id number]  Date of Service: 05/05/2018 DOB: 05-Mar-1976  KG:URKYHC, Pcp Not In  Earnie Larsson, MD     REFERRING PHYSICIAN: Earnie Larsson, MD   DIAGNOSIS: There were no encounter diagnoses.   HISTORY OF PRESENT ILLNESS: Leslie [name] is a 42 y.o. female seen at the request of Dr. Annette Stable with a newly diagnosted a left lung cancer. The patient was brought into the hospital last week by her significant other after several days of not feeling well and difficulty with incomplete speech with word finding. She had been taking OTC meds for headache the whole week prior and once brought to the ED imaging of her brain revealed a large left temporal lesion measuring 3 x 2.7 cm. She had additional staging imaging that reveals a 5.6 x 3.9 cm mass in the lingula of the left lung, and a 9 mm node adjacent to the aortic arch was seen. 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 Final pathology however is calling her result a high grade carcinoma, and IHC is pending. Molecular studies are also pending. She underwent left craniotomy on 04/29/18 with resection of her left temporal tumor and her pathology is pending. We met the date of her discharge and discussed postoperative SRS either 1-3 fractions to the surgical cavity. She is scheduled to meet with Dr. Julien Nordmann on 05/11/18, and PET on 05/13/18. She has been scheduled for repeat MRI on 05/23/18, simulation 05/24/18, and treatment on 05/26/18.         PREVIOUS RADIATION THERAPY: No   PAST MEDICAL HISTORY:  Past Medical History:  Diagnosis Date  . Migraines   . Yeast infection        PAST SURGICAL HISTORY: Past Surgical History:  Procedure Laterality Date  . APPLICATION OF CRANIAL NAVIGATION Left 04/29/2018   Procedure: APPLICATION OF  CRANIAL NAVIGATION;  Surgeon: Earnie Larsson, MD;  Location: [place];  Service: Neurosurgery;  Laterality: Left;  . CRANIOTOMY Left 04/29/2018   Procedure: LEFT CRANIOTOMY FOR  TUMOR BRAIN LAB;  Surgeon: Earnie Larsson, MD;  Location: [place];  Service: Neurosurgery;  Laterality: Left;     FAMILY HISTORY:  Family History  Problem Relation Age of Onset  . Cancer Father        lung     SOCIAL HISTORY:  reports that she has never smoked. She has never used smokeless tobacco. She reports that she drinks alcohol. She reports that she does not use drugs.   ALLERGIES: Patient has no known allergies.   MEDICATIONS:  Current Outpatient Medications  Medication Sig Dispense Refill  . dexamethasone (DECADRON) 4 MG tablet Take 0.5 tablets (2 mg total) by mouth every 8 (eight) hours. 60 tablet 2  . HCA CALCIUM-MAGNESIUM-ZINC PO Take by mouth. Taking one half tablespoon daily    . HYDROcodone-acetaminophen (NORCO/VICODIN) 5-325 MG tablet Take 1-2 tablets by mouth every 4 (four) hours as needed for moderate pain. (Patient not taking: Reported on 05/05/2018) 30 tablet 0   No current facility-administered medications for this encounter.      REVIEW OF SYSTEMS: On review of systems, the patient reports that she is doing well overall. She reports a pulling sensation of the left temporal surgical site. Her speech is well outlined denies any chest pain, shortness of breath, cough, fevers,  chills, night sweats, unintended weight changes. She denies any bowel or bladder disturbances, and denies abdominal pain, nausea or vomiting. She denies any new musculoskeletal or joint aches or pains. A complete review of systems is obtained and is otherwise negative.     PHYSICAL EXAM:  Wt Readings from Last 3 Encounters:  05/05/18 135 lb 12.8 oz (61.6 kg)  04/29/18 142 lb (64.4 kg)  12/16/12 130 lb (59 kg)   Temp Readings from Last 3 Encounters:  05/05/18 98.3 F (36.8 C) (Oral)  05/02/18 97.8 F (36.6 C)   BP  Readings from Last 3 Encounters:  05/05/18 111/69  05/02/18 107/80  12/16/12 110/68   Pulse Readings from Last 3 Encounters:  05/05/18 61  05/02/18 65  12/16/12 72   Pain Assessment Pain Score: 0-No pain/10  In general this is a well appearing caucasian female in no acute distress. She is alert and oriented x4 and appropriate throughout the examination. HEENT reveals that the patient is normocephalic, atraumatic with a well healing left temporal incision without erythema.Marland Kitchen EOMs are intact. Skin is intact without any evidence of gross lesions. Cardiopulmonary assessment is negative for acute distress and she exhibits normal effort.    ECOG = 1  0 - Asymptomatic (Fully active, able to carry on all predisease activities without restriction)  1 - Symptomatic but completely ambulatory (Restricted in physically strenuous activity but ambulatory and able to carry out work of a light or sedentary nature. For example, light housework, office work)  2 - Symptomatic, <50% in bed during the day (Ambulatory and capable of all self care but unable to carry out any work activities. Up and about more than 50% of waking hours)  3 - Symptomatic, >50% in bed, but not bedbound (Capable of only limited self-care, confined to bed or chair 50% or more of waking hours)  4 - Bedbound (Completely disabled. Cannot carry on any self-care. Totally confined to bed or chair)  5 - Death   Eustace Pen MM, Creech RH, Tormey DC, et al. [phone]). "Toxicity and response criteria of the [facility]". [place] Oncol. 5 (6): 649-55    LABORATORY DATA:  Lab Results  Component Value Date   WBC 23.1 (H) 04/30/2018   HGB 12.5 04/30/2018   HCT 39.0 04/30/2018   MCV 91.3 04/30/2018   PLT 399 04/30/2018   Lab Results  Component Value Date   NA 140 04/30/2018   K 4.0 04/30/2018   CL 101 04/30/2018   CO2 29 04/30/2018   Lab Results  Component Value Date   ALT 17 04/25/2018   AST 20 04/25/2018     ALKPHOS 93 04/25/2018   BILITOT 0.3 04/25/2018      RADIOGRAPHY: Dg Chest 2 View  Result Date: 04/25/2018 CLINICAL DATA:  Vomiting, body aches and headache. EXAM: CHEST - 2 VIEW COMPARISON:  None. FINDINGS: Heart size and mediastinal contours are within normal limits. Rounded mass overlies the LEFT lower lung, measuring 5 cm greatest dimension, most likely LEFT lower lobe as there is some associated obscuration of the LEFT heart border, less discretely seen on the lateral view but likely in the lingula based on the lateral view. RIGHT lung is clear. No pleural effusion or pneumothorax seen. Osseous structures about the chest are unremarkable. IMPRESSION: Rounded mass overlying the LEFT lung base, measuring 5 cm greatest dimension, most likely within the lingula, less likely within the breast or other site outside the chest. Favor primary lung cancer. Recommend chest CT  with contrast for further characterization. These results were called by telephone at the time of interpretation on 04/25/2018 at 11:38 pm to Dr. Shirlyn Goltz , who verbally acknowledged these results. Electronically Signed   By: Franki Cabot M.D.   On: 04/25/2018 23:39   Ct Head Wo Contrast  Result Date: 04/25/2018 CLINICAL DATA:  Vomiting all week in, body aches and headache. EXAM: CT HEAD WITHOUT CONTRAST TECHNIQUE: Contiguous axial images were obtained from the base of the skull through the vertex without intravenous contrast. COMPARISON:  None. FINDINGS: Brain: Mass within the LEFT temporal lobe measures approximately 2.8 cm, with extensive surrounding vasogenic edema which extends upwards into the LEFT frontoparietal lobe above the level of the LEFT lateral ventricle. There is associated mass effect with effacement of the LEFT lateral ventricle and a rightward midline shift which measures approximately 10 mm. No parenchymal hemorrhage or extra-axial hemorrhage identified. Vascular: No hyperdense vessel or unexpected calcification.  Skull: Normal. Negative for fracture or focal lesion. Sinuses/Orbits: No acute finding. Other: None. IMPRESSION: Mass within the LEFT temporal lobe, almost certainly neoplastic, with associated large amount of vasogenic edema which extends upwards from the LEFT temporal lobe into the LEFT frontoparietal lobe at and just above the level of the lateral ventricles. Associated mass effect with effacement of the LEFT lateral ventricle and rightward midline shift measuring approximately 10 mm. Also suspect some degree of LEFT-sided uncal herniation. No evidence of tonsillar or transtentorial herniation. No intracranial hemorrhage. Recommend brain MRI with contrast for further characterization. These results and recommendations were called by telephone at the time of interpretation on 04/25/2018 at 11:30 pm to Dr. Geanie Kenning , who verbally acknowledged these results. Electronically Signed   By: Franki Cabot M.D.   On: 04/25/2018 23:33   Ct Chest W Contrast  Result Date: 04/26/2018 CLINICAL DATA:  Nausea vomiting with body ache EXAM: CT CHEST, ABDOMEN, AND PELVIS WITH CONTRAST TECHNIQUE: Multidetector CT imaging of the chest, abdomen and pelvis was performed following the standard protocol during bolus administration of intravenous contrast. CONTRAST:  193m ISOVUE-300 IOPAMIDOL (ISOVUE-300) INJECTION 61% COMPARISON:  Chest x-ray 04/25/2018 FINDINGS: CT CHEST FINDINGS Cardiovascular: Nonaneurysmal aorta. No pericardial effusion. Normal heart size. Mediastinum/Nodes: Subcentimeter hypodensity right lobe of thyroid. Midline trachea. 9 mm lymph node adjacent to the anterior arch. Esophagus within normal limits. Lungs/Pleura: No pleural effusion or pneumothorax. 5.6 x 3.9 cm heterogenous mass within the lingula. Musculoskeletal: No acute or suspicious abnormality. CT ABDOMEN PELVIS FINDINGS Hepatobiliary: No focal liver abnormality is seen. No gallstones, gallbladder wall thickening, or biliary dilatation. Pancreas:  Unremarkable. No pancreatic ductal dilatation or surrounding inflammatory changes. Spleen: Normal in size without focal abnormality. Adrenals/Urinary Tract: Adrenal glands are unremarkable. Kidneys are normal, without renal calculi, focal lesion, or hydronephrosis. Bladder is unremarkable. Stomach/Bowel: Stomach is within normal limits. Appendix appears normal. No evidence of bowel wall thickening, distention, or inflammatory changes. Vascular/Lymphatic: Nonaneurysmal aorta.  No significant adenopathy. Reproductive: Uterus and bilateral adnexa are unremarkable. Other: Small free fluid in the pelvis.  No free air. Musculoskeletal: No acute or significant osseous findings. IMPRESSION: 1. 5.6 x 3.9 cm heterogenous solid enhancing mass in the left lower lung. Mass is not separable from the pericardium and appears to displace surrounding bronchi and left pulmonary fissure, suggesting that this represents a pericardial mass as opposed to an intraparenchymal lung mass. 2. Few prominent lymph nodes adjacent to the aortic arch. 3. No CT evidence for acute intra-abdominal or pelvic abnormality. Small amount of free fluid in the pelvis.  Electronically Signed   By: Donavan Foil M.D.   On: 04/26/2018 01:59   Mr Jeri Cos JS Contrast  Result Date: 05/01/2018 CLINICAL DATA:  Follow-up examination status post tumor resection. EXAM: MRI HEAD WITHOUT AND WITH CONTRAST TECHNIQUE: Multiplanar, multiecho pulse sequences of the brain and surrounding structures were obtained without and with intravenous contrast. CONTRAST:  15m MULTIHANCE GADOBENATE DIMEGLUMINE 529 MG/ML IV SOLN COMPARISON:  Prior MRI from 04/26/2018. FINDINGS: Brain: Postoperative changes from interval left temporal craniotomy for tumor resection are seen. Small postoperative extra-axial collection measuring up to 4 mm overlies the anterior left temporal pole. Previously seen left temporal lobe mass has been resected, with postoperative blood products seen within the  resection cavity. Fairly smooth serpiginous enhancement seen at the deep and posterior aspect of the resection cavity favored to be postoperative in nature (series 17001, image 67 no significant nodular or masslike enhancement about the resection cavity to suggest residual tumor identified. Resection margins are well demarcated with no evidence for Peri resection infarct or other complication. Persistent vasogenic edema with regional mass effect throughout the left frontotemporal region with partial effacement of the left lateral ventricle, overall slightly improved from previous with improved 5 mm of left-to-right shift (8 mm on preoperative MRI from 04/26/2018. Persistent mild crowding of the basilar cisterns which remain patent. Remainder the brain is otherwise stable in appearance. No evidence for acute infarct. No other mass lesion. No hydrocephalus or ventricular trapping. No other extra-axial fluid collection. Vascular: Major intracranial vascular flow voids are maintained. Skull and upper cervical spine: Craniocervical junction normal. Upper cervical spine normal. Post craniotomy changes present at the left temporal calvarium. Overlying postoperative scalp swelling and edema. Sinuses/Orbits: Globes and orbital soft tissues within normal limits. Paranasal sinuses are clear. No mastoid effusion. Inner ear structures normal. Other: None. IMPRESSION: 1. Postoperative changes from interval left temporal craniotomy for tumor resection without complication. There has been gross total resection of the left temporal lobe mass, with no appreciable residual tumor identified. 2. Persistent but improved vasogenic edema throughout the left frontotemporal region with persistent 5 mm of left-to-right midline shift. 3. Otherwise stable and unremarkable MRI of the brain. Electronically Signed   By: BJeannine BogaM.D.   On: 05/01/2018 02:08   Mr BJeri CosWHFContrast  Result Date: 04/26/2018 CLINICAL DATA:  Initial  evaluation for intracranial tumor. EXAM: MRI HEAD WITHOUT AND WITH CONTRAST TECHNIQUE: Multiplanar, multiecho pulse sequences of the brain and surrounding structures were obtained without and with intravenous contrast. CONTRAST:  132mMULTIHANCE GADOBENATE DIMEGLUMINE 529 MG/ML IV SOLN COMPARISON:  Prior head CT from 04/25/2018 as well as prior chest CT from earlier the same day. FINDINGS: Brain: Previously identified mass positioned at the anterior left temporal lobe again seen, measuring 3.0 x 2.7 x 2.6 cm (AP by transverse by craniocaudad). Heterogeneous T2 signal abnormality with scattered foci of susceptibility artifact within the central aspect of the lesion consistent with necrosis. Lesion demonstrates avid postcontrast rim enhancement. Lesion is fairly well demarcated anteriorly and medially, but demonstrates somewhat infiltrative enhancement along its lateral and posterior aspect (series 11, image 71). Enhancement closely approximates the adjacent temporal horn of the left lateral ventricle without definite ependymal enhancement or intraventricular extension. Associated extensive vasogenic edema and regional mass effect throughout the left frontotemporal region with extension into the left cerebral peduncle and left midbrain. Mass effect on the left lateral ventricle which is partially effaced. Associated 8 mm of left-to-right midline shift with crowding of the basilar cisterns. No  hydrocephalus or frank ventricular trapping. No other mass lesions or abnormal enhancement within the brain. Remainder of the brain is normal in appearance. No acute infarct. No other mass lesion or abnormal enhancement. No extra-axial fluid collection. Major dural sinuses are grossly patent. Pituitary gland suprasellar region normal. Vascular: Major intracranial vascular flow voids are well maintained. Skull and upper cervical spine: Craniocervical junction within normal limits. Upper cervical spine normal. Visualized osseous  structures within normal limits. No discrete osseous lesions. Scalp soft tissues unremarkable. Sinuses/Orbits: Globes and orbital soft tissues within normal limits. Small right maxillary sinus retention cyst. Paranasal sinuses are otherwise clear. No mastoid effusion. Inner ear structures grossly normal. Other: None. IMPRESSION: 1. 3.0 x 2.7 x 2.6 cm rim enhancing mass at the anterior left temporal lobe. 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 2. Associated vasogenic edema with regional mass effect and 8 mm of left-to-right midline shift. Electronically Signed   By: Jeannine Boga M.D.   On: 04/26/2018 15:09   Ct Abdomen Pelvis W Contrast  Result Date: 04/26/2018 CLINICAL DATA:  Nausea vomiting with body ache EXAM: CT CHEST, ABDOMEN, AND PELVIS WITH CONTRAST TECHNIQUE: Multidetector CT imaging of the chest, abdomen and pelvis was performed following the standard protocol during bolus administration of intravenous contrast. CONTRAST:  17m ISOVUE-300 IOPAMIDOL (ISOVUE-300) INJECTION 61% COMPARISON:  Chest x-ray 04/25/2018 FINDINGS: CT CHEST FINDINGS Cardiovascular: Nonaneurysmal aorta. No pericardial effusion. Normal heart size. Mediastinum/Nodes: Subcentimeter hypodensity right lobe of thyroid. Midline trachea. 9 mm lymph node adjacent to the anterior arch. Esophagus within normal limits. Lungs/Pleura: No pleural effusion or pneumothorax. 5.6 x 3.9 cm heterogenous mass within the lingula. Musculoskeletal: No acute or suspicious abnormality. CT ABDOMEN PELVIS FINDINGS Hepatobiliary: No focal liver abnormality is seen. No gallstones, gallbladder wall thickening, or biliary dilatation. Pancreas: Unremarkable. No pancreatic ductal dilatation or surrounding inflammatory changes. Spleen: Normal in size without focal abnormality. Adrenals/Urinary Tract: Adrenal glands are unremarkable. Kidneys are normal, without renal calculi, focal lesion, or hydronephrosis. Bladder  is unremarkable. Stomach/Bowel: Stomach is within normal limits. Appendix appears normal. No evidence of bowel wall thickening, distention, or inflammatory changes. Vascular/Lymphatic: Nonaneurysmal aorta.  No significant adenopathy. Reproductive: Uterus and bilateral adnexa are unremarkable. Other: Small free fluid in the pelvis.  No free air. Musculoskeletal: No acute or significant osseous findings. IMPRESSION: 1. 5.6 x 3.9 cm heterogenous solid enhancing mass in the left lower lung. Mass is not separable from the pericardium and appears to displace surrounding bronchi and left pulmonary fissure, suggesting that this represents a pericardial mass as opposed to an intraparenchymal lung mass. 2. Few prominent lymph nodes adjacent to the aortic arch. 3. No CT evidence for acute intra-abdominal or pelvic abnormality. Small amount of free fluid in the pelvis. Electronically Signed   By: KDonavan FoilM.D.   On: 04/26/2018 01:59   Ct Biopsy  Result Date: 04/27/2018 INDICATION: No known primary, now with left upper lobe/lingular mass and brain lesion worrisome for metastatic bronchogenic carcinoma versus infection. Please perform CT-guided biopsy for tissue diagnostic purposes. EXAM: CT-GUIDED BIOPSY OF LEFT UPPER LOBE PULMONARY MASS COMPARISON:  CT the chest, abdomen and pelvis - 04/26/2018 MEDICATIONS: None. ANESTHESIA/SEDATION: Fentanyl 50 mcg IV; Versed 1 mg IV Sedation time: 14 minutes; The patient was continuously monitored during the procedure by the interventional radiology nurse under my direct supervision. CONTRAST:  None COMPLICATIONS: None immediate. PROCEDURE: Informed consent was obtained from the patient following an explanation of the procedure, risks, benefits and alternatives. The  patient understands,agrees and consents for the procedure. All questions were addressed. A time out was performed prior to the initiation of the procedure. The patient was positioned supine, slightly RPO on the CT table  and a limited chest CT was performed for procedural planning demonstrating no change to slight increase in size of the now approximately 5.8 x 4.1 cm mass within the left upper lobe crossing the left major fissure to the lingula, previously, 5.6 x 3.9 cm. The operative site was prepped and draped in the usual sterile fashion. Under sterile conditions and local anesthesia, a 17 gauge coaxial needle was advanced into the peripheral aspect of the nodule. Positioning was confirmed with intermittent CT fluoroscopy and followed by the acquisition of 4 core needle biopsies with an 18 gauge core needle biopsy device. Samples were set aside for both surgical pathologic as well as gram stain analysis. The coaxial needle was removed as approximately 2 cc of bloody fluid was aspirated. All aspirated fluid is capped and sent to the laboratory for analysis. Superficial hemostasis was achieved with manual compression. Limited post procedural chest CT was negative for pneumothorax or additional complication. A dressing was placed. The patient tolerated the procedure well without immediate postprocedural complication. The patient was escorted to have an upright chest radiograph. IMPRESSION: Technically successful CT guided core needle core biopsy of indeterminate left upper lobe/lingular mass. Samples were sent both for surgical pathologic as well as Gram stain analysis. Electronically Signed   By: Sandi Mariscal M.D.   On: 04/27/2018 16:36   Dg Chest Port 1 View  Result Date: 04/27/2018 CLINICAL DATA:  Post CT guided Biopsy of left upper pulmonary nodule/mass EXAM: PORTABLE CHEST 1 VIEW COMPARISON:  Chest x-ray dated 04/25/2018 and chest CT from earlier today. FINDINGS: Again noted is the rounded mass at the LEFT lung base, unchanged in the short-term interval. No pleural effusion or pneumothorax appreciated status post today's CT-guided biopsy. Heart size and mediastinal contours are stable. IMPRESSION: No pleural effusion or  pneumothorax seen status post today's CT-guided lung biopsy. Electronically Signed   By: Franki Cabot M.D.   On: 04/27/2018 17:22       IMPRESSION/PLAN: 1. 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 Dr. Lisbeth Renshaw discusses the pathology findings and reviews the nature of metastatic cancer from the lung to the brain. He discusses the rationale to proceed with PET scan and repeat 3T MRI scan for purposes of treatment planning. After her PET scan and molecular studies, it will make it more clear how to approach her disease systemically as well as locally and she may benefit from additional approaches with radiotherapy. We discussed the risks, benefits, short, and long term effects of radiotherapy, and the patient is interested in proceeding. Dr. Lisbeth Renshaw discusses the delivery and logistics of radiotherapy and anticipates a course of 1-3 fractions of stereotactic radiotherapy. We will proceed as outlined above with reimaging of the brain following additional healing. She will continue Dexamethasone 2 mg TID, and will likely taper this after seeing Dr. Annette Stable and we will continue this taper as we complete radiotherapy. She will proceed with meeting medical oncology next week as well. Written consent is obtained and placed in the chart, a copy was provided to the patient.  In a visit lasting 45 minutes, greater than 50% of the time was spent face to face discussing her case, and coordinating the patient's care.  The above documentation reflects my direct findings during this shared  patient visit. Please see the separate note by Dr. Lisbeth Renshaw on this date for the remainder of the patient's plan of care.    Carola Rhine, PAC

## 2018-05-05 NOTE — Progress Notes (Addendum)
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 - SEE COMMENT. Microscopic Comment The biopsy fragments reveal a high grade appearing malignant   Patient presented with symptoms of: several days of not feeling well with headaches and difficulty with incomplete speech with word finding increasing anxiety, confusion, nausea vomiting for last 1 week.    Tobacco/Marijuana/Snuff/ETOH use: Never smoker no drug usage social alcohol intake  Past/Anticipated interventions by cardiothoracic surgery, if any:Dr. Lanelle Bal  MPRESSION:04-27-18  Technically successful CT guided core needle core biopsy of indeterminate left upper lobe/lingular mass. Samples were sent both for surgical pathologic as well as Gram stain analysis.   Past or anticipated interventions, if any, per neurosurgery:  Dr. Earnie Larsson Tuesday, 05-10-18 Will see Dr. Annette Stable again for a follow up visit 05-02-18 Post op follow up  IMPRESSION:05-01-18 MRI brain w wo contrast 1. Postoperative changes from interval left temporal craniotomy for tumor resection without complication. There has been gross total resection of the left temporal lobe mass, with no appreciable residual tumor identified. 2. Persistent but improved vasogenic edema throughout the left frontotemporal region with persistent 5 mm of left-to-right midline shift. 3. Otherwise stable and unremarkable MRI of the brain.   04-29-18 LEFT CRANIOTOMY FOR TUMOR BRAIN   Craniotomy incision line with sutures without signs of infection has scant amount of old bloody drainage no odor.  Past or anticipated interventions, if any, per medical oncology: None   Dose of Decadron, if applicable: 2 mg po every 8  hours   No evidence of  thrush  Signs/Symptoms Weight changes, if any: Wt Readings from Last 3 Encounters:  05/05/18 135 lb 12.8 oz (61.6 kg)  04/29/18 142 lb (64.4 kg)  12/16/12 130 lb (59 kg)    Respiratory complaints, if any: Denies SOB,coughing or wheezing  Hemoptysis, if any: No  Pain issues, if any: No   Recent neurologic symptoms, if any:   Seizures: No  Headaches: yes before her craniotomy none now  Nausea: No  Dizziness/ataxia:No  Difficulty with hand coordination: No  Focal numbness/weakness: No  Visual deficits/changes: No  Confusion/Memory deficits: Not in the past two days had memory deficit before her surgery on 04-29-18  Painful bone metastases at present, if any:   SAFETY ISSUES:  Prior radiation? :No  Pacemaker/ICD? :No  Possible current pregnancy? :No  Is the patient on methotrexate? :No  Additional Complaints / other details: Father lung cancer     IMPRESSION:04-26-18  Done by Internal Medicine 1. 3.0 x 2.7 x 2.6 cm rim enhancing mass at the anterior left temporal lobe. 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 2. Associated vasogenic edema with regional mass effect and 8 mm of left-to-right midline shift.   IMPRESSION: 04-25-18 CT Abdomen Pelvis w contrast  Done in ED 1. 5.6 x 3.9 cm heterogenous solid enhancing mass in the left lower lung. Mass is not separable from the pericardium and appears to displace surrounding bronchi and left pulmonary fissure, suggesting that this represents a pericardial mass as opposed to an intraparenchymal lung mass. 2. Few prominent lymph nodes adjacent to the aortic arch. 3. No CT evidence for acute intra-abdominal or pelvic abnormality. Small amount of free fluid in the pelvis.   IMPRESSION: 04-25-18 CT Chest  w contrast done in ED 1. 5.6 x 3.9 cm heterogenous solid enhancing mass in the left lower lung. Mass is not separable from the pericardium and  appears to displace surrounding bronchi and left pulmonary fissure, suggesting that this represents a pericardial mass as opposed to an intraparenchymal lung mass. 2. Few prominent lymph nodes adjacent to the aortic arch. 3. No CT evidence for acute intra-abdominal or pelvic abnormality. Small amount of free fluid in the pelvis.  BP 111/69 (BP Location: Left Arm, Patient Position: Sitting, Cuff Size: Normal)   Pulse 61   Temp 98.3 F (36.8 C) (Oral)   Resp 18   Ht 5' 9"  (1.753 m)   Wt 135 lb 12.8 oz (61.6 kg)   LMP 04/18/2018 Comment: negative beta HCG 04/25/18  SpO2 100%   BMI 20.05 kg/m

## 2018-05-10 ENCOUNTER — Other Ambulatory Visit: Payer: Self-pay | Admitting: Medical Oncology

## 2018-05-10 DIAGNOSIS — C3432 Malignant neoplasm of lower lobe, left bronchus or lung: Secondary | ICD-10-CM

## 2018-05-11 ENCOUNTER — Inpatient Hospital Stay: Payer: BLUE CROSS/BLUE SHIELD | Attending: Internal Medicine | Admitting: Internal Medicine

## 2018-05-11 ENCOUNTER — Telehealth: Payer: Self-pay | Admitting: Internal Medicine

## 2018-05-11 ENCOUNTER — Inpatient Hospital Stay: Payer: BLUE CROSS/BLUE SHIELD

## 2018-05-11 ENCOUNTER — Encounter: Payer: Self-pay | Admitting: Internal Medicine

## 2018-05-11 VITALS — BP 115/83 | HR 68 | Temp 98.4°F | Resp 17 | Ht 69.0 in | Wt 136.4 lb

## 2018-05-11 DIAGNOSIS — Z9889 Other specified postprocedural states: Secondary | ICD-10-CM

## 2018-05-11 DIAGNOSIS — C7931 Secondary malignant neoplasm of brain: Secondary | ICD-10-CM | POA: Diagnosis not present

## 2018-05-11 DIAGNOSIS — Z7189 Other specified counseling: Secondary | ICD-10-CM

## 2018-05-11 DIAGNOSIS — R599 Enlarged lymph nodes, unspecified: Secondary | ICD-10-CM | POA: Diagnosis not present

## 2018-05-11 DIAGNOSIS — R918 Other nonspecific abnormal finding of lung field: Secondary | ICD-10-CM | POA: Diagnosis not present

## 2018-05-11 DIAGNOSIS — C801 Malignant (primary) neoplasm, unspecified: Secondary | ICD-10-CM

## 2018-05-11 DIAGNOSIS — Z801 Family history of malignant neoplasm of trachea, bronchus and lung: Secondary | ICD-10-CM | POA: Insufficient documentation

## 2018-05-11 DIAGNOSIS — C3432 Malignant neoplasm of lower lobe, left bronchus or lung: Secondary | ICD-10-CM

## 2018-05-11 DIAGNOSIS — Z79899 Other long term (current) drug therapy: Secondary | ICD-10-CM | POA: Diagnosis not present

## 2018-05-11 LAB — CMP (CANCER CENTER ONLY)
ALT: 18 U/L (ref 0–55)
AST: 16 U/L (ref 5–34)
Albumin: 3.8 g/dL (ref 3.5–5.0)
Alkaline Phosphatase: 108 U/L (ref 40–150)
Anion gap: 11 (ref 3–11)
BUN: 15 mg/dL (ref 7–26)
CO2: 27 mmol/L (ref 22–29)
Calcium: 9.6 mg/dL (ref 8.4–10.4)
Chloride: 101 mmol/L (ref 98–109)
Creatinine: 0.8 mg/dL (ref 0.60–1.10)
GFR, Est AFR Am: >60 mL/min (ref >60)
GFR, Estimated: >60 mL/min (ref >60)
Glucose, Bld: 80 mg/dL (ref 70–140)
Potassium: 4.2 mmol/L (ref 3.5–5.1)
Sodium: 139 mmol/L (ref 136–145)
Total Bilirubin: 0.3 mg/dL (ref 0.2–1.2)
Total Protein: 7.2 g/dL (ref 6.4–8.3)

## 2018-05-11 LAB — CBC WITH DIFFERENTIAL (CANCER CENTER ONLY)
Basophils Absolute: 0 10*3/uL (ref 0.0–0.1)
Basophils Relative: 0 %
Eosinophils Absolute: 0 10*3/uL (ref 0.0–0.5)
Eosinophils Relative: 0 %
HCT: 39.8 % (ref 34.8–46.6)
Hemoglobin: 13 g/dL (ref 11.6–15.9)
Lymphocytes Relative: 4 %
Lymphs Abs: 0.7 10*3/uL — ABNORMAL LOW (ref 0.9–3.3)
MCH: 29.6 pg (ref 25.1–34.0)
MCHC: 32.7 g/dL (ref 31.5–36.0)
MCV: 90.7 fL (ref 79.5–101.0)
Monocytes Absolute: 1.3 10*3/uL — ABNORMAL HIGH (ref 0.1–0.9)
Monocytes Relative: 7 %
Neutro Abs: 18.2 10*3/uL — ABNORMAL HIGH (ref 1.5–6.5)
Neutrophils Relative %: 89 %
Platelet Count: 344 10*3/uL (ref 145–400)
RBC: 4.39 MIL/uL (ref 3.70–5.45)
RDW: 12.7 % (ref 11.2–14.5)
WBC Count: 20.3 10*3/uL — ABNORMAL HIGH (ref 3.9–10.3)

## 2018-05-11 NOTE — Telephone Encounter (Signed)
Appointments scheduled AVS/Calendar printed per 5/29 los.  Patient is going to check with her Insurance regarding referral to [facility] before appointment can be made. Patient is established with Dr Roxy Horseman @ [place]

## 2018-05-11 NOTE — Progress Notes (Signed)
[place] Telephone:([phone]   [phone]) [phone]  CONSULT NOTE  REFERRING PHYSICIAN: Dr. Earnie Larsson.  REASON FOR CONSULTATION:  42 years old white female with high-grade malignancy.  HPI Leslie [name] is a 42 y.o. female a never smoker with no significant past medical history and very active and athletic.  The patient mentioned that she exercises at regular basis but few weeks ago she started feeling more tired and fatigued than normal.  She also had some headache and pressure in her head.  She took Excedrin with no improvement she finally presented to the emergency department on 04/25/2018 complaining of vomiting, body ache and headache and CT scan of the head showed a mass within the left temporal lobe measuring approximately 2.8 cm with extensive surrounding vasogenic edema which extends upward into the left frontoparietal lobe above the level of the left lateral ventricle.  There was associated mass-effect with effacement of the left lateral ventricle and rightward midline shift which measured approximately 10 mm.  Chest x-ray done on the same day showed a rounded mass overlying the left lung base and measuring 5 cm in greatest dimension most likely within the lingula.  This was followed by CT scan of the chest, abdomen and pelvis on 04/26/2018 and that showed a 5.6 x 3.9 cm heterogeneous mass within the lingula.  The mass is not separable from the pericardium and appears to displace surrounding bronchi and left pulmonary fissure suggesting that this represented a pericardial mass as opposed to an intraparenchymal lung mass.  There was subcentimeter hypo-density right upper lobe of thyroid and 0.9 cm lymph node adjacent to the anterior arch.  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  There was associated vasogenic edema with  regional mass-effect and 8 mm of left-to-right midline shift. On 04/27/2018 the patient underwent CT-guided core biopsy of the left upper lobe pulmonary mass and the final pathology ([id number]) showed high-grade malignancy.  The biopsy fragments revealed a high-grade appearing malignant process consisting of enlarged with very irregular nuclei and abundant mitotic figures.  No significant architectural features are identified.  There is a slight epithelioid hint to the tumor cells.  A battery of immunohistochemical stains were performed and unfortunately all of which were negative including S100, cytokeratin 903, cytokeratin AE1/AE3, Melan-A, TTF-1, p63, cytokeratin 5/6, smooth muscle myosin, CD 30 and desmin.  Overall it is difficult to specify the phenotype of this tumor. On 04/29/2018 the patient underwent left temporal craniotomy with resection of tumor with intraoperative stereotactic guidance for volumetric resection under the care of Dr. Annette Stable.  The preliminary pathology of this resection was consistent with the biopsy from the lung mass.  The tissue block was sent to [facility] for pathology second opinion. The patient was referred to me today for evaluation and recommendation regarding her condition.  When seen today she continues to complain of fatigue and weakness.  She is currently on Decadron 2 mg p.o. 3 times daily.  She denied having any chest pain, shortness breath, cough or hemoptysis.  She initially lost around 15 pounds but she is gaining her weight back.  She has no nausea, vomiting, diarrhea or constipation.  She denied having any current headache or visual changes.  She has no palpable masses in her breast. Family history significant for father with lung cancer at age 46  and he was a heavy smoker.  He is still alive.  Mother is healthy and alive. The patient is single she was accompanied by her significant other Leslie [name].  She has 2 children ages 6 and 35.  She works as a Financial controller.  She has no history of smoking, drinks alcohol occasionally and no history of drug abuse.  HPI  Past Medical History:  Diagnosis Date  . Migraines   . Yeast infection     Past Surgical History:  Procedure Laterality Date  . APPLICATION OF CRANIAL NAVIGATION Left 04/29/2018   Procedure: APPLICATION OF CRANIAL NAVIGATION;  Surgeon: Earnie Larsson, MD;  Location: [facility];  Service: Neurosurgery;  Laterality: Left;  . CRANIOTOMY Left 04/29/2018   Procedure: LEFT CRANIOTOMY FOR  TUMOR BRAIN LAB;  Surgeon: Earnie Larsson, MD;  Location: [facility];  Service: Neurosurgery;  Laterality: Left;    Family History  Problem Relation Age of Onset  . Cancer Father        lung    Social History Social History   Tobacco Use  . Smoking status: Never Smoker  . Smokeless tobacco: Never Used  Substance Use Topics  . Alcohol use: Yes  . Drug use: No    No Known Allergies  Current Outpatient Medications  Medication Sig Dispense Refill  . dexamethasone (DECADRON) 4 MG tablet Take 0.5 tablets (2 mg total) by mouth every 8 (eight) hours. 60 tablet 2  . HCA CALCIUM-MAGNESIUM-ZINC PO Take by mouth. Taking one half tablespoon daily    . HYDROcodone-acetaminophen (NORCO/VICODIN) 5-325 MG tablet Take 1-2 tablets by mouth every 4 (four) hours as needed for moderate pain. (Patient not taking: Reported on 05/05/2018) 30 tablet 0   No current facility-administered medications for this visit.     Review of Systems  Constitutional: positive for fatigue Eyes: negative Ears, nose, mouth, throat, and face: negative Respiratory: negative Cardiovascular: negative Gastrointestinal: negative Genitourinary:negative Integument/breast: negative Hematologic/lymphatic: negative Musculoskeletal:negative Neurological: negative Behavioral/Psych: negative Endocrine: negative Allergic/Immunologic: negative  Physical Exam  FGH:WEXHB, healthy, no distress, well nourished, well developed and anxious SKIN: skin  color, texture, turgor are normal, no rashes or significant lesions HEAD: Normocephalic, No masses, lesions, tenderness or abnormalities EYES: normal, PERRLA, Conjunctiva are pink and non-injected EARS: External ears normal, Canals clear OROPHARYNX:no exudate, no erythema and lips, buccal mucosa, and tongue normal  NECK: supple, no adenopathy, no JVD LYMPH:  no palpable lymphadenopathy, no hepatosplenomegaly BREAST:not examined LUNGS: clear to auscultation , and palpation HEART: regular rate & rhythm, no murmurs and no gallops ABDOMEN:abdomen soft, non-tender, normal bowel sounds and no masses or organomegaly BACK: Back symmetric, no curvature., No CVA tenderness EXTREMITIES:no joint deformities, effusion, or inflammation, no edema, no skin discoloration  NEURO: alert & oriented x 3 with fluent speech, no focal motor/sensory deficits  PERFORMANCE STATUS: ECOG 1  LABORATORY DATA: Lab Results  Component Value Date   WBC 20.3 (H) 05/11/2018   HGB 13.0 05/11/2018   HCT 39.8 05/11/2018   MCV 90.7 05/11/2018   PLT 344 05/11/2018      Chemistry      Component Value Date/Time   NA 139 05/11/2018 1113   K 4.2 05/11/2018 1113   CL 101 05/11/2018 1113   CO2 27 05/11/2018 1113   BUN 15 05/11/2018 1113   CREATININE 0.80 05/11/2018 1113      Component Value Date/Time   CALCIUM 9.6 05/11/2018 1113   ALKPHOS 108 05/11/2018 1113   AST 16 05/11/2018 1113   ALT 18  05/11/2018 1113   BILITOT 0.3 05/11/2018 1113       RADIOGRAPHIC STUDIES: Dg Chest 2 View  Result Date: 04/25/2018 CLINICAL DATA:  Vomiting, body aches and headache. EXAM: CHEST - 2 VIEW COMPARISON:  None. FINDINGS: Heart size and mediastinal contours are within normal limits. Rounded mass overlies the LEFT lower lung, measuring 5 cm greatest dimension, most likely LEFT lower lobe as there is some associated obscuration of the LEFT heart border, less discretely seen on the lateral view but likely in the lingula based on the  lateral view. RIGHT lung is clear. No pleural effusion or pneumothorax seen. Osseous structures about the chest are unremarkable. IMPRESSION: Rounded mass overlying the LEFT lung base, measuring 5 cm greatest dimension, most likely within the lingula, less likely within the breast or other site outside the chest. Favor primary lung cancer. Recommend chest CT with contrast for further characterization. These results were called by telephone at the time of interpretation on 04/25/2018 at 11:38 pm to Dr. Shirlyn Goltz , who verbally acknowledged these results. Electronically Signed   By: Franki Cabot M.D.   On: 04/25/2018 23:39   Ct Head Wo Contrast  Result Date: 04/25/2018 CLINICAL DATA:  Vomiting all week in, body aches and headache. EXAM: CT HEAD WITHOUT CONTRAST TECHNIQUE: Contiguous axial images were obtained from the base of the skull through the vertex without intravenous contrast. COMPARISON:  None. FINDINGS: Brain: Mass within the LEFT temporal lobe measures approximately 2.8 cm, with extensive surrounding vasogenic edema which extends upwards into the LEFT frontoparietal lobe above the level of the LEFT lateral ventricle. There is associated mass effect with effacement of the LEFT lateral ventricle and a rightward midline shift which measures approximately 10 mm. No parenchymal hemorrhage or extra-axial hemorrhage identified. Vascular: No hyperdense vessel or unexpected calcification. Skull: Normal. Negative for fracture or focal lesion. Sinuses/Orbits: No acute finding. Other: None. IMPRESSION: Mass within the LEFT temporal lobe, almost certainly neoplastic, with associated large amount of vasogenic edema which extends upwards from the LEFT temporal lobe into the LEFT frontoparietal lobe at and just above the level of the lateral ventricles. Associated mass effect with effacement of the LEFT lateral ventricle and rightward midline shift measuring approximately 10 mm. Also suspect some degree of LEFT-sided  uncal herniation. No evidence of tonsillar or transtentorial herniation. No intracranial hemorrhage. Recommend brain MRI with contrast for further characterization. These results and recommendations were called by telephone at the time of interpretation on 04/25/2018 at 11:30 pm to Dr. Geanie Kenning , who verbally acknowledged these results. Electronically Signed   By: Franki Cabot M.D.   On: 04/25/2018 23:33   Ct Chest W Contrast  Result Date: 04/26/2018 CLINICAL DATA:  Nausea vomiting with body ache EXAM: CT CHEST, ABDOMEN, AND PELVIS WITH CONTRAST TECHNIQUE: Multidetector CT imaging of the chest, abdomen and pelvis was performed following the standard protocol during bolus administration of intravenous contrast. CONTRAST:  160m ISOVUE-300 IOPAMIDOL (ISOVUE-300) INJECTION 61% COMPARISON:  Chest x-ray 04/25/2018 FINDINGS: CT CHEST FINDINGS Cardiovascular: Nonaneurysmal aorta. No pericardial effusion. Normal heart size. Mediastinum/Nodes: Subcentimeter hypodensity right lobe of thyroid. Midline trachea. 9 mm lymph node adjacent to the anterior arch. Esophagus within normal limits. Lungs/Pleura: No pleural effusion or pneumothorax. 5.6 x 3.9 cm heterogenous mass within the lingula. Musculoskeletal: No acute or suspicious abnormality. CT ABDOMEN PELVIS FINDINGS Hepatobiliary: No focal liver abnormality is seen. No gallstones, gallbladder wall thickening, or biliary dilatation. Pancreas: Unremarkable. No pancreatic ductal dilatation or surrounding inflammatory changes. Spleen: Normal in  size without focal abnormality. Adrenals/Urinary Tract: Adrenal glands are unremarkable. Kidneys are normal, without renal calculi, focal lesion, or hydronephrosis. Bladder is unremarkable. Stomach/Bowel: Stomach is within normal limits. Appendix appears normal. No evidence of bowel wall thickening, distention, or inflammatory changes. Vascular/Lymphatic: Nonaneurysmal aorta.  No significant adenopathy. Reproductive: Uterus and  bilateral adnexa are unremarkable. Other: Small free fluid in the pelvis.  No free air. Musculoskeletal: No acute or significant osseous findings. IMPRESSION: 1. 5.6 x 3.9 cm heterogenous solid enhancing mass in the left lower lung. Mass is not separable from the pericardium and appears to displace surrounding bronchi and left pulmonary fissure, suggesting that this represents a pericardial mass as opposed to an intraparenchymal lung mass. 2. Few prominent lymph nodes adjacent to the aortic arch. 3. No CT evidence for acute intra-abdominal or pelvic abnormality. Small amount of free fluid in the pelvis. Electronically Signed   By: Donavan Foil M.D.   On: 04/26/2018 01:59   Mr Jeri Cos LG Contrast  Result Date: 05/01/2018 CLINICAL DATA:  Follow-up examination status post tumor resection. EXAM: MRI HEAD WITHOUT AND WITH CONTRAST TECHNIQUE: Multiplanar, multiecho pulse sequences of the brain and surrounding structures were obtained without and with intravenous contrast. CONTRAST:  49m MULTIHANCE GADOBENATE DIMEGLUMINE 529 MG/ML IV SOLN COMPARISON:  Prior MRI from 04/26/2018. FINDINGS: Brain: Postoperative changes from interval left temporal craniotomy for tumor resection are seen. Small postoperative extra-axial collection measuring up to 4 mm overlies the anterior left temporal pole. Previously seen left temporal lobe mass has been resected, with postoperative blood products seen within the resection cavity. Fairly smooth serpiginous enhancement seen at the deep and posterior aspect of the resection cavity favored to be postoperative in nature (series 17001, image 67 no significant nodular or masslike enhancement about the resection cavity to suggest residual tumor identified. Resection margins are well demarcated with no evidence for Peri resection infarct or other complication. Persistent vasogenic edema with regional mass effect throughout the left frontotemporal region with partial effacement of the left  lateral ventricle, overall slightly improved from previous with improved 5 mm of left-to-right shift (8 mm on preoperative MRI from 04/26/2018. Persistent mild crowding of the basilar cisterns which remain patent. Remainder the brain is otherwise stable in appearance. No evidence for acute infarct. No other mass lesion. No hydrocephalus or ventricular trapping. No other extra-axial fluid collection. Vascular: Major intracranial vascular flow voids are maintained. Skull and upper cervical spine: Craniocervical junction normal. Upper cervical spine normal. Post craniotomy changes present at the left temporal calvarium. Overlying postoperative scalp swelling and edema. Sinuses/Orbits: Globes and orbital soft tissues within normal limits. Paranasal sinuses are clear. No mastoid effusion. Inner ear structures normal. Other: None. IMPRESSION: 1. Postoperative changes from interval left temporal craniotomy for tumor resection without complication. There has been gross total resection of the left temporal lobe mass, with no appreciable residual tumor identified. 2. Persistent but improved vasogenic edema throughout the left frontotemporal region with persistent 5 mm of left-to-right midline shift. 3. Otherwise stable and unremarkable MRI of the brain. Electronically Signed   By: BJeannine BogaM.D.   On: 05/01/2018 02:08   Mr BJeri CosWXQContrast  Result Date: 04/26/2018 CLINICAL DATA:  Initial evaluation for intracranial tumor. EXAM: MRI HEAD WITHOUT AND WITH CONTRAST TECHNIQUE: Multiplanar, multiecho pulse sequences of the brain and surrounding structures were obtained without and with intravenous contrast. CONTRAST:  146mMULTIHANCE GADOBENATE DIMEGLUMINE 529 MG/ML IV SOLN COMPARISON:  Prior head CT from 04/25/2018 as well as prior chest  CT from earlier the same day. FINDINGS: Brain: Previously identified mass positioned at the anterior left temporal lobe again seen, measuring 3.0 x 2.7 x 2.6 cm (AP by  transverse by craniocaudad). Heterogeneous T2 signal abnormality with scattered foci of susceptibility artifact within the central aspect of the lesion consistent with necrosis. Lesion demonstrates avid postcontrast rim enhancement. Lesion is fairly well demarcated anteriorly and medially, but demonstrates somewhat infiltrative enhancement along its lateral and posterior aspect (series 11, image 71). Enhancement closely approximates the adjacent temporal horn of the left lateral ventricle without definite ependymal enhancement or intraventricular extension. Associated extensive vasogenic edema and regional mass effect throughout the left frontotemporal region with extension into the left cerebral peduncle and left midbrain. Mass effect on the left lateral ventricle which is partially effaced. Associated 8 mm of left-to-right midline shift with crowding of the basilar cisterns. No hydrocephalus or frank ventricular trapping. No other mass lesions or abnormal enhancement within the brain. Remainder of the brain is normal in appearance. No acute infarct. No other mass lesion or abnormal enhancement. No extra-axial fluid collection. Major dural sinuses are grossly patent. Pituitary gland suprasellar region normal. Vascular: Major intracranial vascular flow voids are well maintained. Skull and upper cervical spine: Craniocervical junction within normal limits. Upper cervical spine normal. Visualized osseous structures within normal limits. No discrete osseous lesions. Scalp soft tissues unremarkable. Sinuses/Orbits: Globes and orbital soft tissues within normal limits. Small right maxillary sinus retention cyst. Paranasal sinuses are otherwise clear. No mastoid effusion. Inner ear structures grossly normal. Other: None. IMPRESSION: 1. 3.0 x 2.7 x 2.6 cm rim enhancing mass at the anterior left temporal lobe. 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 2. Associated vasogenic edema  with regional mass effect and 8 mm of left-to-right midline shift. Electronically Signed   By: Jeannine Boga M.D.   On: 04/26/2018 15:09   Ct Abdomen Pelvis W Contrast  Result Date: 04/26/2018 CLINICAL DATA:  Nausea vomiting with body ache EXAM: CT CHEST, ABDOMEN, AND PELVIS WITH CONTRAST TECHNIQUE: Multidetector CT imaging of the chest, abdomen and pelvis was performed following the standard protocol during bolus administration of intravenous contrast. CONTRAST:  186m ISOVUE-300 IOPAMIDOL (ISOVUE-300) INJECTION 61% COMPARISON:  Chest x-ray 04/25/2018 FINDINGS: CT CHEST FINDINGS Cardiovascular: Nonaneurysmal aorta. No pericardial effusion. Normal heart size. Mediastinum/Nodes: Subcentimeter hypodensity right lobe of thyroid. Midline trachea. 9 mm lymph node adjacent to the anterior arch. Esophagus within normal limits. Lungs/Pleura: No pleural effusion or pneumothorax. 5.6 x 3.9 cm heterogenous mass within the lingula. Musculoskeletal: No acute or suspicious abnormality. CT ABDOMEN PELVIS FINDINGS Hepatobiliary: No focal liver abnormality is seen. No gallstones, gallbladder wall thickening, or biliary dilatation. Pancreas: Unremarkable. No pancreatic ductal dilatation or surrounding inflammatory changes. Spleen: Normal in size without focal abnormality. Adrenals/Urinary Tract: Adrenal glands are unremarkable. Kidneys are normal, without renal calculi, focal lesion, or hydronephrosis. Bladder is unremarkable. Stomach/Bowel: Stomach is within normal limits. Appendix appears normal. No evidence of bowel wall thickening, distention, or inflammatory changes. Vascular/Lymphatic: Nonaneurysmal aorta.  No significant adenopathy. Reproductive: Uterus and bilateral adnexa are unremarkable. Other: Small free fluid in the pelvis.  No free air. Musculoskeletal: No acute or significant osseous findings. IMPRESSION: 1. 5.6 x 3.9 cm heterogenous solid enhancing mass in the left lower lung. Mass is not separable from the  pericardium and appears to displace surrounding bronchi and left pulmonary fissure, suggesting that this represents a pericardial mass as opposed to an intraparenchymal lung mass. 2. Few prominent lymph nodes  adjacent to the aortic arch. 3. No CT evidence for acute intra-abdominal or pelvic abnormality. Small amount of free fluid in the pelvis. Electronically Signed   By: Donavan Foil M.D.   On: 04/26/2018 01:59   Ct Biopsy  Result Date: 04/27/2018 INDICATION: No known primary, now with left upper lobe/lingular mass and brain lesion worrisome for metastatic bronchogenic carcinoma versus infection. Please perform CT-guided biopsy for tissue diagnostic purposes. EXAM: CT-GUIDED BIOPSY OF LEFT UPPER LOBE PULMONARY MASS COMPARISON:  CT the chest, abdomen and pelvis - 04/26/2018 MEDICATIONS: None. ANESTHESIA/SEDATION: Fentanyl 50 mcg IV; Versed 1 mg IV Sedation time: 14 minutes; The patient was continuously monitored during the procedure by the interventional radiology nurse under my direct supervision. CONTRAST:  None COMPLICATIONS: None immediate. PROCEDURE: Informed consent was obtained from the patient following an explanation of the procedure, risks, benefits and alternatives. The patient understands,agrees and consents for the procedure. All questions were addressed. A time out was performed prior to the initiation of the procedure. The patient was positioned supine, slightly RPO on the CT table and a limited chest CT was performed for procedural planning demonstrating no change to slight increase in size of the now approximately 5.8 x 4.1 cm mass within the left upper lobe crossing the left major fissure to the lingula, previously, 5.6 x 3.9 cm. The operative site was prepped and draped in the usual sterile fashion. Under sterile conditions and local anesthesia, a 17 gauge coaxial needle was advanced into the peripheral aspect of the nodule. Positioning was confirmed with intermittent CT fluoroscopy and  followed by the acquisition of 4 core needle biopsies with an 18 gauge core needle biopsy device. Samples were set aside for both surgical pathologic as well as gram stain analysis. The coaxial needle was removed as approximately 2 cc of bloody fluid was aspirated. All aspirated fluid is capped and sent to the laboratory for analysis. Superficial hemostasis was achieved with manual compression. Limited post procedural chest CT was negative for pneumothorax or additional complication. A dressing was placed. The patient tolerated the procedure well without immediate postprocedural complication. The patient was escorted to have an upright chest radiograph. IMPRESSION: Technically successful CT guided core needle core biopsy of indeterminate left upper lobe/lingular mass. Samples were sent both for surgical pathologic as well as Gram stain analysis. Electronically Signed   By: Sandi Mariscal M.D.   On: 04/27/2018 16:36   Dg Chest Port 1 View  Result Date: 04/27/2018 CLINICAL DATA:  Post CT guided Biopsy of left upper pulmonary nodule/mass EXAM: PORTABLE CHEST 1 VIEW COMPARISON:  Chest x-ray dated 04/25/2018 and chest CT from earlier today. FINDINGS: Again noted is the rounded mass at the LEFT lung base, unchanged in the short-term interval. No pleural effusion or pneumothorax appreciated status post today's CT-guided biopsy. Heart size and mediastinal contours are stable. IMPRESSION: No pleural effusion or pneumothorax seen status post today's CT-guided lung biopsy. Electronically Signed   By: Franki Cabot M.D.   On: 04/27/2018 17:22    ASSESSMENT: This is a very pleasant 42 years old white female with metastatic high-grade malignancy of unknown primary could be lung cancer versus malignancy starting in the heart versus any other primary diagnosed in May 2019 and presented with large central mass in the left upper lobe as well as a small right paratracheal lymph node and solitary brain metastasis. She is status  post craniotomy and surgical resection of the left temporal lobe tumor.  PLAN: I had a lengthy discussion with the  patient and her boyfriend today about her current disease status and treatment options. Unfortunately the final pathology from the lung biopsy is not conclusive of her primary.  We are still waiting the results from the brain biopsy that was sent to [facility] for second pathology opinion. The patient is scheduled to have a PET scan on 05/13/2018. I also requested the tissue block [facility] for molecular studies and PDL 1 expression. She is also scheduled to see Dr. Lisbeth Renshaw from radiation oncology for consideration of palliative radiotherapy to the resection cavity. I discussed with the patient several options for management of her condition.  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  She was seen briefly by Dr. Servando Snare during her hospitalization.  I requested a formal consult with him for more detailed discussion of this option. Depending on the final pathology we will discuss her systemic treatment options and more details in the next visit.   I will also refer the patient to Dr. Aniceto Boss or Dr. Sharlet Salina at [facility] for a second opinion because of the unusual presentation of her condition. The patient agreed to the current plan. I will see her back for follow-up visit in 2-3 weeks for more detailed discussion of her treatment options after the molecular studies and final pathology report. She was advised to call immediately if she has any concerning symptoms in the interval. The patient voices understanding of current disease status and treatment options and is in agreement with the current care plan.  All questions were answered. The patient knows to call the clinic with any problems, questions or concerns. We can certainly see the patient much sooner if necessary.  Thank you so much  for allowing me to participate in the care of [facility]. I will continue to follow up the patient with you and assist in her care.  Disclaimer: This note was dictated with voice recognition software. Similar sounding words can inadvertently be transcribed and may not be corrected upon review.   Eilleen Kempf May 11, 2018, 12:18 PM

## 2018-05-12 ENCOUNTER — Encounter (HOSPITAL_COMMUNITY): Payer: Self-pay | Admitting: Neurosurgery

## 2018-05-12 ENCOUNTER — Telehealth: Payer: Self-pay | Admitting: Medical Oncology

## 2018-05-12 NOTE — Telephone Encounter (Signed)
Called with additional questions and hx . 1. Can she take Mushroom supplement and an electrolyte supplement? 2.Do I need to continue IS ( incentive spirometry)- I told her no 3.. PCP is Rachell Cipro 4. Chiropractor is Richardson Dopp- not currently seeing him now. ( hx migraines) 5. Hx umbilical hernia 6. Does not feel pressure in her chest -more like when she has a cold -feels it at end of day.Marland Kitchen

## 2018-05-13 ENCOUNTER — Encounter: Payer: Self-pay | Admitting: *Deleted

## 2018-05-13 ENCOUNTER — Encounter (HOSPITAL_COMMUNITY)
Admission: RE | Admit: 2018-05-13 | Discharge: 2018-05-13 | Disposition: A | Discharge status: Home / Self Care | Payer: BLUE CROSS/BLUE SHIELD | Source: Ambulatory Visit | Attending: Radiation Oncology | Admitting: Radiation Oncology

## 2018-05-13 DIAGNOSIS — C3490 Malignant neoplasm of unspecified part of unspecified bronchus or lung: Secondary | ICD-10-CM | POA: Insufficient documentation

## 2018-05-13 LAB — GLUCOSE, CAPILLARY: Glucose-Capillary: 85 mg/dL (ref 65–99)

## 2018-05-13 MED ORDER — FLUDEOXYGLUCOSE F - 18 (FDG) INJECTION
7.6000 | Freq: Once | INTRAVENOUS | Status: AC | PRN
  Administered 2018-05-13: 7.6 via INTRAVENOUS

## 2018-05-13 NOTE — Progress Notes (Signed)
[place] [facility]  Clinical Social Work was referred by [facility] navigator for assessment of psychosocial needs.  Clinical Social Worker contacted patient by phone  to offer support and assess for needs.  Patient shared medical situation and multiple uncertainties regarding diagnosis/plan for treatment.  CSW explored feelings of fear, anxiety surrounding uncertainty, and loss of control.  CSW and patient briefly discussed how to communicate with her two children. Patient is interested in [place] counseling services, but requested time to receive more information regarding her diagnosis and process her options.  CSW agreed to follow up with patient in around two weeks.      Kennith Center, LCSW  Clinical Social Worker [facility]

## 2018-05-17 ENCOUNTER — Telehealth: Payer: Self-pay | Admitting: *Deleted

## 2018-05-17 NOTE — Telephone Encounter (Signed)
Per MD, discussed with pt not to take any supplements, for sleep try otc sleep aid. Results from [facility] are pending MD will notify her as soon as they arrive. Pt should still go to [facility] for second opinion, pt asked who she should see. Recommended pt see Dr. Sharlet Salina or [facility] at [facility] for 2nd opinion. No further concerns.

## 2018-05-18 ENCOUNTER — Telehealth: Payer: Self-pay | Admitting: *Deleted

## 2018-05-18 ENCOUNTER — Telehealth: Payer: Self-pay | Admitting: Internal Medicine

## 2018-05-18 NOTE — Telephone Encounter (Signed)
FAXED RECORDS TO DR STINCHCOMBE'S OFFICE.

## 2018-05-18 NOTE — Telephone Encounter (Signed)
Called pt to check on her, pt advised she took 1/2 sleep aid last night and got 6 full hours of rest. Gave pt Referral information for Dr. Sharlet Salina and Aniceto Boss at [facility]. Pt thanked me for the call. Per MD transferred call to MD,  discussed results with pt.

## 2018-05-19 ENCOUNTER — Ambulatory Visit: Payer: Self-pay | Admitting: Radiation Oncology

## 2018-05-23 ENCOUNTER — Ambulatory Visit
Admission: RE | Admit: 2018-05-23 | Discharge: 2018-05-23 | Disposition: A | Discharge status: Home / Self Care | Payer: BLUE CROSS/BLUE SHIELD | Source: Ambulatory Visit | Attending: Radiation Oncology | Admitting: Radiation Oncology

## 2018-05-23 DIAGNOSIS — C7931 Secondary malignant neoplasm of brain: Secondary | ICD-10-CM | POA: Diagnosis not present

## 2018-05-23 DIAGNOSIS — C7949 Secondary malignant neoplasm of other parts of nervous system: Principal | ICD-10-CM

## 2018-05-23 DIAGNOSIS — C3490 Malignant neoplasm of unspecified part of unspecified bronchus or lung: Secondary | ICD-10-CM | POA: Diagnosis not present

## 2018-05-23 MED ORDER — GADOBENATE DIMEGLUMINE 529 MG/ML IV SOLN
12.0000 mL | Freq: Once | INTRAVENOUS | Status: AC | PRN
  Administered 2018-05-23: 12 mL via INTRAVENOUS

## 2018-05-23 NOTE — Progress Notes (Signed)
Has armband been applied?  Yes  Does patient have an allergy to IV contrast dye?: No   Has patient ever received premedication for IV contrast dye?: No  Does patient take metformin?: No  If patient does take metformin when was the last dose: N/A  Date of lab work: 05/11/2018 BUN: 15 CR: 0.80 EGfr: >60  IV site: Right AC  Has IV site been added to flowsheet?  Yes  Cori Razor, RN

## 2018-05-24 ENCOUNTER — Institutional Professional Consult (permissible substitution) (INDEPENDENT_AMBULATORY_CARE_PROVIDER_SITE_OTHER): Payer: BLUE CROSS/BLUE SHIELD | Admitting: Cardiothoracic Surgery

## 2018-05-24 ENCOUNTER — Ambulatory Visit
Admission: RE | Admit: 2018-05-24 | Discharge: 2018-05-24 | Disposition: A | Discharge status: Home / Self Care | Payer: BLUE CROSS/BLUE SHIELD | Source: Ambulatory Visit | Attending: Radiation Oncology | Admitting: Radiation Oncology

## 2018-05-24 ENCOUNTER — Other Ambulatory Visit: Payer: Self-pay

## 2018-05-24 ENCOUNTER — Encounter: Payer: Self-pay | Admitting: Cardiothoracic Surgery

## 2018-05-24 VITALS — BP 107/74 | HR 66 | Resp 18 | Ht 69.0 in | Wt 137.0 lb

## 2018-05-24 DIAGNOSIS — C3432 Malignant neoplasm of lower lobe, left bronchus or lung: Secondary | ICD-10-CM | POA: Diagnosis not present

## 2018-05-24 DIAGNOSIS — Z510 Encounter for antineoplastic radiation therapy: Secondary | ICD-10-CM | POA: Diagnosis not present

## 2018-05-24 DIAGNOSIS — G9389 Other specified disorders of brain: Secondary | ICD-10-CM

## 2018-05-24 DIAGNOSIS — G939 Disorder of brain, unspecified: Secondary | ICD-10-CM | POA: Diagnosis not present

## 2018-05-24 DIAGNOSIS — C7931 Secondary malignant neoplasm of brain: Secondary | ICD-10-CM | POA: Diagnosis not present

## 2018-05-24 MED ORDER — SODIUM CHLORIDE 0.9% FLUSH
10.0000 mL | Freq: Once | INTRAVENOUS | Status: AC
  Administered 2018-05-24: 10 mL via INTRAVENOUS

## 2018-05-24 NOTE — Progress Notes (Signed)
FMLA paperwork mailed to patient. No fax number provided on paperwork.

## 2018-05-24 NOTE — Progress Notes (Signed)
[address]       [place] [address]             [phone]                    Leslie [name] [place] Medical Record #[id number] Date of Birth: [birth year]  Referring: Curt Bears, MD Primary Care: System, Pcp Not In Primary Cardiologist: No primary care provider on file.  Chief Complaint:    Chief Complaint  Patient presents with  . Lung Mass    new patient evaluation, PET 05/13/2018, CT 04/27/2018, MRI 05/23/2018    History of Present Illness:    Leslie [name] 42 y.o. female is seen in the office  today for follow-up after recent consultation while hospitalized.  The patient presented with acute neurologic symptoms and was found to have a large left lung mass and an isolated cerebral metastasis.  Needle biopsy of the lung mass showed poorly differentiated tumor.  The patient ultimately underwent resection of the intracranial mass, also showed to be poorly differentiated, and ultimately most likely melanoma.  The patient has no known primary source for this.  She notes that she sees a dermatologist yearly and all lesions removed but checked pathologically.   The patient tolerated her brain surgery well, no she is thinking much more clearly than she was preoperatively and is more functional.  Since last seen in the hospital she has had a PET scan done.  Current Activity/ Functional Status:  Patient is independent with mobility/ambulation, transfers, ADL's, IADL's.   Zubrod Score: At the time of surgery this patient's most appropriate activity status/level should be described as: [x]     0    Normal activity, no symptoms []     1    Restricted in physical strenuous activity but ambulatory, able to do out light work []     2    Ambulatory and capable of self care, unable to do work activities, up and about               >50 % of waking hours                              []     3    Only limited self care, in bed greater than 50% of waking  hours []     4    Completely disabled, no self care, confined to bed or chair []     5    Moribund   Past Medical History:  Diagnosis Date  . Migraines   . Yeast infection     Past Surgical History:  Procedure Laterality Date  . APPLICATION OF CRANIAL NAVIGATION Left 04/29/2018   Procedure: APPLICATION OF CRANIAL NAVIGATION;  Surgeon: Earnie Larsson, MD;  Location: [facility];  Service: Neurosurgery;  Laterality: Left;  . CRANIOTOMY Left 04/29/2018   Procedure: LEFT CRANIOTOMY FOR  TUMOR BRAIN LAB;  Surgeon: Earnie Larsson, MD;  Location: [facility];  Service: Neurosurgery;  Laterality: Left;    Family History  Problem Relation Age of Onset  . Cancer Father        lung     Social History   Tobacco Use  Smoking Status Never Smoker  Smokeless Tobacco Never Used    Social History   Substance and Sexual Activity  Alcohol Use Yes     No Known Allergies  Current Outpatient Medications  Medication Sig Dispense Refill  .  dexamethasone (DECADRON) 4 MG tablet Take 0.5 tablets (2 mg total) by mouth every 8 (eight) hours. 60 tablet 2  . HCA CALCIUM-MAGNESIUM-ZINC PO Take by mouth. Taking one half tablespoon daily    . Melatonin 5 MG CAPS Take by mouth.    . Multiple Vitamin (MULTIVITAMIN) tablet Take 1 tablet by mouth daily.    . Omega 3-6-9 Fatty Acids (OMEGA 3-6-9 COMPLEX PO) Take 1 capsule by mouth daily.    Marland Kitchen OVER THE COUNTER MEDICATION     . Probiotic Product (PROBIOTIC-10) CAPS Take 3 capsules by mouth daily.     No current facility-administered medications for this visit.      Review of Systems:     Cardiac Review of Systems: [Y] = yes  or   [ N ] = no   Chest Pain [   n ]  Resting SOB [n ] Exertional SOB  [  n]  Orthopnea [n  ]   Pedal Edema Florencio.Farrier   ]    Palpitations [ n ] Syncope  [ n ]   Presyncope [  n ]   General Review of Systems: [Y] = yes [  ]=no Constitional: recent weight change [  ];  Wt loss over the last 3 months [   ] anorexia [  ]; fatigue [  ]; nausea [  ]; night  sweats [  ]; fever [  ]; or chills [  ];           Eye : blurred vision [  ]; diplopia [   ]; vision changes [  ];  Amaurosis fugax[  ]; Resp: cough [  ];  wheezing[  ];  hemoptysis[  ]; shortness of breath[  ]; paroxysmal nocturnal dyspnea[  ]; dyspnea on exertion[  ]; or orthopnea[  ];  GI:  gallstones[  ], vomiting[  ];  dysphagia[  ]; melena[  ];  hematochezia [  ]; heartburn[  ];   Hx of  Colonoscopy[  ]; GU: kidney stones [  ]; hematuria[  ];   dysuria [  ];  nocturia[  ];  history of     obstruction [  ]; urinary frequency [  ]             Skin: rash, swelling[  ];, hair loss[  ];  peripheral edema[  ];  or itching[  ]; Musculosketetal: myalgias[  ];  joint swelling[  ];  joint erythema[  ];  joint pain[  ];  back pain[  ];  Heme/Lymph: bruising[  ];  bleeding[  ];  anemia[  ];  Neuro: TIA[  ];  headaches[  ];  stroke[  ];  vertigo[  ];  seizures[  ];   paresthesias[  ];  difficulty walking[  ];  Psych:depression[  ]; anxiety[  ];  Endocrine: diabetes[  ];  thyroid dysfunction[  ];  Immunizations: Flu up to date [  ]; Pneumococcal up to date [  ];  Other:      PHYSICAL EXAMINATION: BP 107/74 (BP Location: Right Arm, Patient Position: Sitting, Cuff Size: Normal)   Pulse 66   Resp 18   Ht 5' 9"  (1.753 m)   Wt 137 lb (62.1 kg)   SpO2 98% Comment: RA  BMI 20.23 kg/m  General appearance: alert and cooperative Lymph nodes: Cervical, supraclavicular, and axillary nodes normal. Cardio: regular rate and rhythm, S1, S2 normal, no murmur, click, rub or gallop GI: soft, non-tender; bowel sounds normal; no masses,  no organomegaly Extremities: extremities normal, atraumatic, no cyanosis or edema and Homans sign is negative, no sign of DVT Neurologic: Grossly normal  Diagnostic Studies & Laboratory data:     Recent Radiology Findings:   Dg Chest 2 View  Result Date: 04/25/2018 CLINICAL DATA:  Vomiting, body aches and headache. EXAM: CHEST - 2 VIEW COMPARISON:  None. FINDINGS: Heart  size and mediastinal contours are within normal limits. Rounded mass overlies the LEFT lower lung, measuring 5 cm greatest dimension, most likely LEFT lower lobe as there is some associated obscuration of the LEFT heart border, less discretely seen on the lateral view but likely in the lingula based on the lateral view. RIGHT lung is clear. No pleural effusion or pneumothorax seen. Osseous structures about the chest are unremarkable. IMPRESSION: Rounded mass overlying the LEFT lung base, measuring 5 cm greatest dimension, most likely within the lingula, less likely within the breast or other site outside the chest. Favor primary lung cancer. Recommend chest CT with contrast for further characterization. These results were called by telephone at the time of interpretation on 04/25/2018 at 11:38 pm to Dr. Shirlyn Goltz , who verbally acknowledged these results. Electronically Signed   By: Franki Cabot M.D.   On: 04/25/2018 23:39   Ct Head Wo Contrast  Result Date: 04/25/2018 CLINICAL DATA:  Vomiting all week in, body aches and headache. EXAM: CT HEAD WITHOUT CONTRAST TECHNIQUE: Contiguous axial images were obtained from the base of the skull through the vertex without intravenous contrast. COMPARISON:  None. FINDINGS: Brain: Mass within the LEFT temporal lobe measures approximately 2.8 cm, with extensive surrounding vasogenic edema which extends upwards into the LEFT frontoparietal lobe above the level of the LEFT lateral ventricle. There is associated mass effect with effacement of the LEFT lateral ventricle and a rightward midline shift which measures approximately 10 mm. No parenchymal hemorrhage or extra-axial hemorrhage identified. Vascular: No hyperdense vessel or unexpected calcification. Skull: Normal. Negative for fracture or focal lesion. Sinuses/Orbits: No acute finding. Other: None. IMPRESSION: Mass within the LEFT temporal lobe, almost certainly neoplastic, with associated large amount of vasogenic edema  which extends upwards from the LEFT temporal lobe into the LEFT frontoparietal lobe at and just above the level of the lateral ventricles. Associated mass effect with effacement of the LEFT lateral ventricle and rightward midline shift measuring approximately 10 mm. Also suspect some degree of LEFT-sided uncal herniation. No evidence of tonsillar or transtentorial herniation. No intracranial hemorrhage. Recommend brain MRI with contrast for further characterization. These results and recommendations were called by telephone at the time of interpretation on 04/25/2018 at 11:30 pm to Dr. Geanie Kenning , who verbally acknowledged these results. Electronically Signed   By: Franki Cabot M.D.   On: 04/25/2018 23:33   Ct Chest W Contrast  Result Date: 04/26/2018 CLINICAL DATA:  Nausea vomiting with body ache EXAM: CT CHEST, ABDOMEN, AND PELVIS WITH CONTRAST TECHNIQUE: Multidetector CT imaging of the chest, abdomen and pelvis was performed following the standard protocol during bolus administration of intravenous contrast. CONTRAST:  123m ISOVUE-300 IOPAMIDOL (ISOVUE-300) INJECTION 61% COMPARISON:  Chest x-ray 04/25/2018 FINDINGS: CT CHEST FINDINGS Cardiovascular: Nonaneurysmal aorta. No pericardial effusion. Normal heart size. Mediastinum/Nodes: Subcentimeter hypodensity right lobe of thyroid. Midline trachea. 9 mm lymph node adjacent to the anterior arch. Esophagus within normal limits. Lungs/Pleura: No pleural effusion or pneumothorax. 5.6 x 3.9 cm heterogenous mass within the lingula. Musculoskeletal: No acute or suspicious abnormality. CT ABDOMEN PELVIS FINDINGS Hepatobiliary: No focal liver abnormality is seen.  No gallstones, gallbladder wall thickening, or biliary dilatation. Pancreas: Unremarkable. No pancreatic ductal dilatation or surrounding inflammatory changes. Spleen: Normal in size without focal abnormality. Adrenals/Urinary Tract: Adrenal glands are unremarkable. Kidneys are normal, without renal  calculi, focal lesion, or hydronephrosis. Bladder is unremarkable. Stomach/Bowel: Stomach is within normal limits. Appendix appears normal. No evidence of bowel wall thickening, distention, or inflammatory changes. Vascular/Lymphatic: Nonaneurysmal aorta.  No significant adenopathy. Reproductive: Uterus and bilateral adnexa are unremarkable. Other: Small free fluid in the pelvis.  No free air. Musculoskeletal: No acute or significant osseous findings. IMPRESSION: 1. 5.6 x 3.9 cm heterogenous solid enhancing mass in the left lower lung. Mass is not separable from the pericardium and appears to displace surrounding bronchi and left pulmonary fissure, suggesting that this represents a pericardial mass as opposed to an intraparenchymal lung mass. 2. Few prominent lymph nodes adjacent to the aortic arch. 3. No CT evidence for acute intra-abdominal or pelvic abnormality. Small amount of free fluid in the pelvis. Electronically Signed   By: Donavan Foil M.D.   On: 04/26/2018 01:59   Mr Jeri Cos OE Contrast  Result Date: 05/23/2018 CLINICAL DATA:  Metastatic lung cancer. Surgical treatment 04/29/2018. EXAM: MRI HEAD WITHOUT AND WITH CONTRAST TECHNIQUE: Multiplanar, multiecho pulse sequences of the brain and surrounding structures were obtained without and with intravenous contrast. CONTRAST:  72m MULTIHANCE GADOBENATE DIMEGLUMINE 529 MG/ML IV SOLN COMPARISON:  04/30/2018.  04/26/2018. FINDINGS: Brain: Interval total resection of the solitary metastatic lesion at the left anterior inferior temporal lobe. Small post resection space with minimal enhancement along the edge. No sign of residual or recurrent tumor. Marked reduction in edema and mass effect. No second lesion. Mild chronic small-vessel ischemic changes of the cerebral hemispheric white matter appear the same. No hydrocephalus. Vascular: Major vessels at the base of the brain show flow. Skull and upper cervical spine: Negative Sinuses/Orbits: Clear/normal  Other: None IMPRESSION: Total resection of a previously seen 3 cm metastasis at the left anterior temporal lobe. No evidence of residual disease or of any second lesion. Minimal enhancement along the margin of the resection cavity, not unexpected. This can be followed. Electronically Signed   By: MNelson ChimesM.D.   On: 05/23/2018 11:54   Mr BJeri CosWHOContrast  Result Date: 05/01/2018 CLINICAL DATA:  Follow-up examination status post tumor resection. EXAM: MRI HEAD WITHOUT AND WITH CONTRAST TECHNIQUE: Multiplanar, multiecho pulse sequences of the brain and surrounding structures were obtained without and with intravenous contrast. CONTRAST:  139mMULTIHANCE GADOBENATE DIMEGLUMINE 529 MG/ML IV SOLN COMPARISON:  Prior MRI from 04/26/2018. FINDINGS: Brain: Postoperative changes from interval left temporal craniotomy for tumor resection are seen. Small postoperative extra-axial collection measuring up to 4 mm overlies the anterior left temporal pole. Previously seen left temporal lobe mass has been resected, with postoperative blood products seen within the resection cavity. Fairly smooth serpiginous enhancement seen at the deep and posterior aspect of the resection cavity favored to be postoperative in nature (series 17001, image 67 no significant nodular or masslike enhancement about the resection cavity to suggest residual tumor identified. Resection margins are well demarcated with no evidence for Peri resection infarct or other complication. Persistent vasogenic edema with regional mass effect throughout the left frontotemporal region with partial effacement of the left lateral ventricle, overall slightly improved from previous with improved 5 mm of left-to-right shift (8 mm on preoperative MRI from 04/26/2018. Persistent mild crowding of the basilar cisterns which remain patent. Remainder the brain is otherwise stable in  appearance. No evidence for acute infarct. No other mass lesion. No hydrocephalus or  ventricular trapping. No other extra-axial fluid collection. Vascular: Major intracranial vascular flow voids are maintained. Skull and upper cervical spine: Craniocervical junction normal. Upper cervical spine normal. Post craniotomy changes present at the left temporal calvarium. Overlying postoperative scalp swelling and edema. Sinuses/Orbits: Globes and orbital soft tissues within normal limits. Paranasal sinuses are clear. No mastoid effusion. Inner ear structures normal. Other: None. IMPRESSION: 1. Postoperative changes from interval left temporal craniotomy for tumor resection without complication. There has been gross total resection of the left temporal lobe mass, with no appreciable residual tumor identified. 2. Persistent but improved vasogenic edema throughout the left frontotemporal region with persistent 5 mm of left-to-right midline shift. 3. Otherwise stable and unremarkable MRI of the brain. Electronically Signed   By: Jeannine Boga M.D.   On: 05/01/2018 02:08   Mr Jeri Cos LZ Contrast  Result Date: 04/26/2018 CLINICAL DATA:  Initial evaluation for intracranial tumor. EXAM: MRI HEAD WITHOUT AND WITH CONTRAST TECHNIQUE: Multiplanar, multiecho pulse sequences of the brain and surrounding structures were obtained without and with intravenous contrast. CONTRAST:  59m MULTIHANCE GADOBENATE DIMEGLUMINE 529 MG/ML IV SOLN COMPARISON:  Prior head CT from 04/25/2018 as well as prior chest CT from earlier the same day. FINDINGS: Brain: Previously identified mass positioned at the anterior left temporal lobe again seen, measuring 3.0 x 2.7 x 2.6 cm (AP by transverse by craniocaudad). Heterogeneous T2 signal abnormality with scattered foci of susceptibility artifact within the central aspect of the lesion consistent with necrosis. Lesion demonstrates avid postcontrast rim enhancement. Lesion is fairly well demarcated anteriorly and medially, but demonstrates somewhat infiltrative enhancement along its  lateral and posterior aspect (series 11, image 71). Enhancement closely approximates the adjacent temporal horn of the left lateral ventricle without definite ependymal enhancement or intraventricular extension. Associated extensive vasogenic edema and regional mass effect throughout the left frontotemporal region with extension into the left cerebral peduncle and left midbrain. Mass effect on the left lateral ventricle which is partially effaced. Associated 8 mm of left-to-right midline shift with crowding of the basilar cisterns. No hydrocephalus or frank ventricular trapping. No other mass lesions or abnormal enhancement within the brain. Remainder of the brain is normal in appearance. No acute infarct. No other mass lesion or abnormal enhancement. No extra-axial fluid collection. Major dural sinuses are grossly patent. Pituitary gland suprasellar region normal. Vascular: Major intracranial vascular flow voids are well maintained. Skull and upper cervical spine: Craniocervical junction within normal limits. Upper cervical spine normal. Visualized osseous structures within normal limits. No discrete osseous lesions. Scalp soft tissues unremarkable. Sinuses/Orbits: Globes and orbital soft tissues within normal limits. Small right maxillary sinus retention cyst. Paranasal sinuses are otherwise clear. No mastoid effusion. Inner ear structures grossly normal. Other: None. IMPRESSION: 1. 3.0 x 2.7 x 2.6 cm rim enhancing mass at the anterior left temporal lobe. 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 2. Associated vasogenic edema with regional mass effect and 8 mm of left-to-right midline shift. Electronically Signed   By: BJeannine BogaM.D.   On: 04/26/2018 15:09   Ct Abdomen Pelvis W Contrast  Result Date: 04/26/2018 CLINICAL DATA:  Nausea vomiting with body ache EXAM: CT CHEST, ABDOMEN, AND PELVIS WITH CONTRAST TECHNIQUE: Multidetector CT imaging of the chest,  abdomen and pelvis was performed following the standard protocol during bolus administration of intravenous contrast. CONTRAST:  1040mISOVUE-300 IOPAMIDOL (ISOVUE-300) INJECTION 61% COMPARISON:  Chest x-ray 04/25/2018 FINDINGS: CT CHEST FINDINGS Cardiovascular: Nonaneurysmal aorta. No pericardial effusion. Normal heart size. Mediastinum/Nodes: Subcentimeter hypodensity right lobe of thyroid. Midline trachea. 9 mm lymph node adjacent to the anterior arch. Esophagus within normal limits. Lungs/Pleura: No pleural effusion or pneumothorax. 5.6 x 3.9 cm heterogenous mass within the lingula. Musculoskeletal: No acute or suspicious abnormality. CT ABDOMEN PELVIS FINDINGS Hepatobiliary: No focal liver abnormality is seen. No gallstones, gallbladder wall thickening, or biliary dilatation. Pancreas: Unremarkable. No pancreatic ductal dilatation or surrounding inflammatory changes. Spleen: Normal in size without focal abnormality. Adrenals/Urinary Tract: Adrenal glands are unremarkable. Kidneys are normal, without renal calculi, focal lesion, or hydronephrosis. Bladder is unremarkable. Stomach/Bowel: Stomach is within normal limits. Appendix appears normal. No evidence of bowel wall thickening, distention, or inflammatory changes. Vascular/Lymphatic: Nonaneurysmal aorta.  No significant adenopathy. Reproductive: Uterus and bilateral adnexa are unremarkable. Other: Small free fluid in the pelvis.  No free air. Musculoskeletal: No acute or significant osseous findings. IMPRESSION: 1. 5.6 x 3.9 cm heterogenous solid enhancing mass in the left lower lung. Mass is not separable from the pericardium and appears to displace surrounding bronchi and left pulmonary fissure, suggesting that this represents a pericardial mass as opposed to an intraparenchymal lung mass. 2. Few prominent lymph nodes adjacent to the aortic arch. 3. No CT evidence for acute intra-abdominal or pelvic abnormality. Small amount of free fluid in the pelvis.  Electronically Signed   By: Donavan Foil M.D.   On: 04/26/2018 01:59   Nm Pet Image Initial (pi) Skull Base To Thigh  Result Date: 05/13/2018 CLINICAL DATA:  Initial treatment strategy for metastatic lung cancer. EXAM: NUCLEAR MEDICINE PET SKULL BASE TO THIGH TECHNIQUE: 7.6 mCi F-18 FDG was injected intravenously. Full-ring PET imaging was performed from the skull base to thigh after the radiotracer. CT data was obtained and used for attenuation correction and anatomic localization. Fasting blood glucose: 85 mg/dl COMPARISON:  CT chest abdomen pelvis 04/26/2018. FINDINGS: Mediastinal blood pool activity: SUV max 1.9 NECK: No hypermetabolic lymph nodes in the neck. Incidental CT findings: None. CHEST: A mass at the base of the left hemithorax straddles the left major fissure, involving the lingula and left lower lobe, with an SUV max of 26.3. Prevascular lymph node measures 8 mm with an SUV max of 3.0. No additional hypermetabolic mediastinal, hilar or axillary lymph nodes. No hypermetabolic pulmonary nodules. Incidental CT findings: No pericardial or pleural effusion. ABDOMEN/PELVIS: There is asymmetric hypermetabolism within the left ovary. Otherwise, no abnormal hypermetabolism in the liver, adrenal glands, spleen or pancreas. No hypermetabolic lymph nodes. Incidental CT findings: Small pelvic free fluid. SKELETON: No abnormal osseous hypermetabolism. Incidental CT findings: Probable bone islands in the left iliac wing. IMPRESSION: 1. Hypermetabolic left upper/left lower lobe mass with a hypermetabolic AP window lymph node. Given recent resection a left temporal lobe metastasis, findings are consistent with stage IV lung cancer. 2. Asymmetric hypermetabolism within the left ovary is nonspecific. If further evaluation is desired, pelvic ultrasound could be performed. Metastatic disease cannot be definitively excluded. 3. Small pelvic free fluid. Electronically Signed   By: Lorin Picket M.D.   On: 05/13/2018  12:05   Ct Biopsy  Result Date: 04/27/2018 INDICATION: No known primary, now with left upper lobe/lingular mass and brain lesion worrisome for metastatic bronchogenic carcinoma versus infection. Please perform CT-guided biopsy for tissue diagnostic purposes. EXAM: CT-GUIDED BIOPSY OF LEFT UPPER LOBE PULMONARY MASS COMPARISON:  CT the chest, abdomen and pelvis - 04/26/2018 MEDICATIONS: None. ANESTHESIA/SEDATION: Fentanyl 50 mcg IV;  Versed 1 mg IV Sedation time: 14 minutes; The patient was continuously monitored during the procedure by the interventional radiology nurse under my direct supervision. CONTRAST:  None COMPLICATIONS: None immediate. PROCEDURE: Informed consent was obtained from the patient following an explanation of the procedure, risks, benefits and alternatives. The patient understands,agrees and consents for the procedure. All questions were addressed. A time out was performed prior to the initiation of the procedure. The patient was positioned supine, slightly RPO on the CT table and a limited chest CT was performed for procedural planning demonstrating no change to slight increase in size of the now approximately 5.8 x 4.1 cm mass within the left upper lobe crossing the left major fissure to the lingula, previously, 5.6 x 3.9 cm. The operative site was prepped and draped in the usual sterile fashion. Under sterile conditions and local anesthesia, a 17 gauge coaxial needle was advanced into the peripheral aspect of the nodule. Positioning was confirmed with intermittent CT fluoroscopy and followed by the acquisition of 4 core needle biopsies with an 18 gauge core needle biopsy device. Samples were set aside for both surgical pathologic as well as gram stain analysis. The coaxial needle was removed as approximately 2 cc of bloody fluid was aspirated. All aspirated fluid is capped and sent to the laboratory for analysis. Superficial hemostasis was achieved with manual compression. Limited post  procedural chest CT was negative for pneumothorax or additional complication. A dressing was placed. The patient tolerated the procedure well without immediate postprocedural complication. The patient was escorted to have an upright chest radiograph. IMPRESSION: Technically successful CT guided core needle core biopsy of indeterminate left upper lobe/lingular mass. Samples were sent both for surgical pathologic as well as Gram stain analysis. Electronically Signed   By: Sandi Mariscal M.D.   On: 04/27/2018 16:36   Dg Chest Port 1 View  Result Date: 04/27/2018 CLINICAL DATA:  Post CT guided Biopsy of left upper pulmonary nodule/mass EXAM: PORTABLE CHEST 1 VIEW COMPARISON:  Chest x-ray dated 04/25/2018 and chest CT from earlier today. FINDINGS: Again noted is the rounded mass at the LEFT lung base, unchanged in the short-term interval. No pleural effusion or pneumothorax appreciated status post today's CT-guided biopsy. Heart size and mediastinal contours are stable. IMPRESSION: No pleural effusion or pneumothorax seen status post today's CT-guided lung biopsy. Electronically Signed   By: Franki Cabot M.D.   On: 04/27/2018 17:22     I have independently reviewed the above radiology studies  and reviewed the findings with the patient.   Recent Lab Findings: Lab Results  Component Value Date   WBC 20.3 (H) 05/11/2018   HGB 13.0 05/11/2018   HCT 39.8 05/11/2018   PLT 344 05/11/2018   GLUCOSE 80 05/11/2018   ALT 18 05/11/2018   AST 16 05/11/2018   NA 139 05/11/2018   K 4.2 05/11/2018   CL 101 05/11/2018   CREATININE 0.80 05/11/2018   BUN 15 05/11/2018   CO2 27 05/11/2018   INR 0.98 04/27/2018      Assessment / Plan:   The ultimate pathologic diagnosis appears to be metastatic melanoma.  Consideration for resecting the lung, residual mass is contemplated.  On serial scans this mass seems to be enlarging fairly quickly.  On most recent PET scan there is also at least one AP window node that is  slightly hypermetabolic.  The greatest concern is that the mass abuts and possibly invades the pericardium, but more importantly is centrally located and crosses the  fissure on the left.  To completely surgically resect this likely would require pneumonectomy.  A pneumonectomy in this setting I do not think would be curative, only palliative and would be reluctant to recommended to the patient.  If she got started on chemo//immune therapy and there was a significant decrease in the size of the lung mass without any other residual disease we could consider surgical resection.  I have explained this to the patient and her boyfriend who is present with her during her appointment.       Grace Isaac MD      [address] [place] [address] Office [phone]   Beeper [phone]  05/24/2018 3:18 PM

## 2018-05-25 ENCOUNTER — Inpatient Hospital Stay: Payer: BLUE CROSS/BLUE SHIELD | Attending: Internal Medicine | Admitting: Internal Medicine

## 2018-05-25 ENCOUNTER — Telehealth: Payer: Self-pay | Admitting: Internal Medicine

## 2018-05-25 ENCOUNTER — Telehealth: Payer: Self-pay | Admitting: Medical Oncology

## 2018-05-25 ENCOUNTER — Encounter: Payer: Self-pay | Admitting: Internal Medicine

## 2018-05-25 VITALS — BP 121/76 | HR 85 | Temp 98.2°F | Resp 18 | Ht 69.0 in | Wt 137.4 lb

## 2018-05-25 DIAGNOSIS — R5382 Chronic fatigue, unspecified: Secondary | ICD-10-CM

## 2018-05-25 DIAGNOSIS — C801 Malignant (primary) neoplasm, unspecified: Secondary | ICD-10-CM | POA: Insufficient documentation

## 2018-05-25 DIAGNOSIS — C7931 Secondary malignant neoplasm of brain: Secondary | ICD-10-CM | POA: Diagnosis not present

## 2018-05-25 DIAGNOSIS — Z5112 Encounter for antineoplastic immunotherapy: Secondary | ICD-10-CM | POA: Diagnosis not present

## 2018-05-25 DIAGNOSIS — C7802 Secondary malignant neoplasm of left lung: Secondary | ICD-10-CM

## 2018-05-25 DIAGNOSIS — Z510 Encounter for antineoplastic radiation therapy: Secondary | ICD-10-CM | POA: Diagnosis not present

## 2018-05-25 DIAGNOSIS — Z79899 Other long term (current) drug therapy: Secondary | ICD-10-CM | POA: Insufficient documentation

## 2018-05-25 DIAGNOSIS — G939 Disorder of brain, unspecified: Secondary | ICD-10-CM | POA: Diagnosis not present

## 2018-05-25 DIAGNOSIS — C7800 Secondary malignant neoplasm of unspecified lung: Secondary | ICD-10-CM | POA: Insufficient documentation

## 2018-05-25 NOTE — Telephone Encounter (Signed)
Appointments scheduled AVS/Calendar printed per 6/12 los

## 2018-05-25 NOTE — Telephone Encounter (Signed)
Decadron taper-Per Julien Nordmann I instructed pt to reduce decadron dose ( 74m tablet ) by taking 1/2 tablet ( 2 mg) bid and on June 20 th start 2 mg once a day. Pt voiced understanding.

## 2018-05-25 NOTE — Progress Notes (Signed)
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  PRIOR THERAPY:left temporal craniotomy with resection of tumor with intraoperative stereotactic guidance for volumetric resection under the care of Dr. Annette Stable on 04/29/2018.  CURRENT THERAPY: First-line treatment with immunotherapy with Keytruda 200 mg IV every 3 weeks.  First dose June 02, 2018  INTERVAL HISTORY: Leslie [name] 42 y.o. female returns to the clinic today for follow-up visit accompanied by her boyfriend.  The patient is feeling fine today with no specific complaints.  She denied having any chest pain, shortness of breath, cough or hemoptysis.  She denied having any fever or chills.  She has no nausea, vomiting, diarrhea or constipation.  She had several studies performed recently including a PET scan as well as cancer type ID which came 90% suspicious for melanoma with her, as the second possibility representing 6%.  The patient was also seen recently by Dr. Servando Snare for evaluation of surgical resection and it was felt that she would require left pneumonectomy because of the tumor infiltration of the left upper lobe and left lower lobe.  She is here today for reevaluation and discussion of her treatment options.  She also has a second opinion with Dr. Durenda Hurt at [facility] on 06/09/2018.  MEDICAL HISTORY: Past Medical History:  Diagnosis Date  . Migraines   . Yeast infection     ALLERGIES:  has No Known Allergies.  MEDICATIONS:  Current Outpatient Medications  Medication Sig Dispense Refill  . dexamethasone (DECADRON) 4 MG tablet Take 0.5 tablets (2 mg  total) by mouth every 8 (eight) hours. 60 tablet 2  . HCA CALCIUM-MAGNESIUM-ZINC PO Take by mouth. Taking one half tablespoon daily    . Multiple Vitamin (MULTIVITAMIN) tablet Take 1 tablet by mouth daily.    . Omega 3-6-9 Fatty Acids (OMEGA 3-6-9 COMPLEX PO) Take 1 capsule by mouth daily.    Marland Kitchen OVER THE COUNTER MEDICATION     . Probiotic Product (PROBIOTIC-10) CAPS Take 3 capsules by mouth daily.    . Melatonin 5 MG CAPS Take by mouth.     No current facility-administered medications for this visit.     SURGICAL HISTORY:  Past Surgical History:  Procedure Laterality Date  . APPLICATION OF CRANIAL NAVIGATION Left 04/29/2018   Procedure: APPLICATION OF CRANIAL NAVIGATION;  Surgeon: Earnie Larsson, MD;  Location: [facility];  Service: Neurosurgery;  Laterality: Left;  . CRANIOTOMY Left 04/29/2018   Procedure: LEFT CRANIOTOMY FOR  TUMOR BRAIN LAB;  Surgeon: Earnie Larsson, MD;  Location: [facility];  Service: Neurosurgery;  Laterality: Left;    REVIEW OF SYSTEMS:  Constitutional: negative Eyes: negative Ears, nose, mouth, throat, and face: negative Respiratory: negative Cardiovascular: negative Gastrointestinal: negative Genitourinary:negative Integument/breast: negative Hematologic/lymphatic: negative Musculoskeletal:negative Neurological: negative Behavioral/Psych: negative Endocrine: negative Allergic/Immunologic: negative   PHYSICAL EXAMINATION: General appearance: alert, cooperative and no distress Head: Normocephalic, without obvious abnormality, atraumatic Neck: no adenopathy, no JVD, supple, symmetrical, trachea midline and thyroid not enlarged, symmetric, no tenderness/mass/nodules Lymph nodes: Cervical, supraclavicular, and axillary nodes normal. Resp: clear to auscultation bilaterally Back: symmetric, no curvature. ROM normal. No CVA tenderness. Cardio: regular rate and rhythm, S1, S2 normal, no murmur, click,  rub or gallop GI: soft, non-tender; bowel sounds normal; no masses,  no  organomegaly Extremities: extremities normal, atraumatic, no cyanosis or edema Neurologic: Alert and oriented X 3, normal strength and tone. Normal symmetric reflexes. Normal coordination and gait  ECOG PERFORMANCE STATUS: 1 - Symptomatic but completely ambulatory  Blood pressure 121/76, pulse 85, temperature 98.2 F (36.8 C), temperature source Oral, resp. rate 18, height 5' 9"  (1.753 m), weight 137 lb 6.4 oz (62.3 kg), SpO2 100 %, unknown if currently breastfeeding.  LABORATORY DATA: Lab Results  Component Value Date   WBC 20.3 (H) 05/11/2018   HGB 13.0 05/11/2018   HCT 39.8 05/11/2018   MCV 90.7 05/11/2018   PLT 344 05/11/2018      Chemistry      Component Value Date/Time   NA 139 05/11/2018 1113   K 4.2 05/11/2018 1113   CL 101 05/11/2018 1113   CO2 27 05/11/2018 1113   BUN 15 05/11/2018 1113   CREATININE 0.80 05/11/2018 1113      Component Value Date/Time   CALCIUM 9.6 05/11/2018 1113   ALKPHOS 108 05/11/2018 1113   AST 16 05/11/2018 1113   ALT 18 05/11/2018 1113   BILITOT 0.3 05/11/2018 1113       RADIOGRAPHIC STUDIES: Dg Chest 2 View  Result Date: 04/25/2018 CLINICAL DATA:  Vomiting, body aches and headache. EXAM: CHEST - 2 VIEW COMPARISON:  None. FINDINGS: Heart size and mediastinal contours are within normal limits. Rounded mass overlies the LEFT lower lung, measuring 5 cm greatest dimension, most likely LEFT lower lobe as there is some associated obscuration of the LEFT heart border, less discretely seen on the lateral view but likely in the lingula based on the lateral view. RIGHT lung is clear. No pleural effusion or pneumothorax seen. Osseous structures about the chest are unremarkable. IMPRESSION: Rounded mass overlying the LEFT lung base, measuring 5 cm greatest dimension, most likely within the lingula, less likely within the breast or other site outside the chest. Favor primary lung cancer. Recommend chest CT with contrast for further characterization. These  results were called by telephone at the time of interpretation on 04/25/2018 at 11:38 pm to Dr. Shirlyn Goltz , who verbally acknowledged these results. Electronically Signed   By: Franki Cabot M.D.   On: 04/25/2018 23:39   Ct Head Wo Contrast  Result Date: 04/25/2018 CLINICAL DATA:  Vomiting all week in, body aches and headache. EXAM: CT HEAD WITHOUT CONTRAST TECHNIQUE: Contiguous axial images were obtained from the base of the skull through the vertex without intravenous contrast. COMPARISON:  None. FINDINGS: Brain: Mass within the LEFT temporal lobe measures approximately 2.8 cm, with extensive surrounding vasogenic edema which extends upwards into the LEFT frontoparietal lobe above the level of the LEFT lateral ventricle. There is associated mass effect with effacement of the LEFT lateral ventricle and a rightward midline shift which measures approximately 10 mm. No parenchymal hemorrhage or extra-axial hemorrhage identified. Vascular: No hyperdense vessel or unexpected calcification. Skull: Normal. Negative for fracture or focal lesion. Sinuses/Orbits: No acute finding. Other: None. IMPRESSION: Mass within the LEFT temporal lobe, almost certainly neoplastic, with associated large amount of vasogenic edema which extends upwards from the LEFT temporal lobe into the LEFT frontoparietal lobe at and just above the level of the lateral ventricles. Associated mass effect with effacement of the LEFT lateral ventricle and rightward midline shift measuring approximately 10 mm. Also suspect some degree of LEFT-sided uncal herniation. No evidence of tonsillar or transtentorial herniation. No intracranial  hemorrhage. Recommend brain MRI with contrast for further characterization. These results and recommendations were called by telephone at the time of interpretation on 04/25/2018 at 11:30 pm to Dr. Geanie Kenning , who verbally acknowledged these results. Electronically Signed   By: Franki Cabot M.D.   On: 04/25/2018  23:33   Ct Chest W Contrast  Result Date: 04/26/2018 CLINICAL DATA:  Nausea vomiting with body ache EXAM: CT CHEST, ABDOMEN, AND PELVIS WITH CONTRAST TECHNIQUE: Multidetector CT imaging of the chest, abdomen and pelvis was performed following the standard protocol during bolus administration of intravenous contrast. CONTRAST:  144m ISOVUE-300 IOPAMIDOL (ISOVUE-300) INJECTION 61% COMPARISON:  Chest x-ray 04/25/2018 FINDINGS: CT CHEST FINDINGS Cardiovascular: Nonaneurysmal aorta. No pericardial effusion. Normal heart size. Mediastinum/Nodes: Subcentimeter hypodensity right lobe of thyroid. Midline trachea. 9 mm lymph node adjacent to the anterior arch. Esophagus within normal limits. Lungs/Pleura: No pleural effusion or pneumothorax. 5.6 x 3.9 cm heterogenous mass within the lingula. Musculoskeletal: No acute or suspicious abnormality. CT ABDOMEN PELVIS FINDINGS Hepatobiliary: No focal liver abnormality is seen. No gallstones, gallbladder wall thickening, or biliary dilatation. Pancreas: Unremarkable. No pancreatic ductal dilatation or surrounding inflammatory changes. Spleen: Normal in size without focal abnormality. Adrenals/Urinary Tract: Adrenal glands are unremarkable. Kidneys are normal, without renal calculi, focal lesion, or hydronephrosis. Bladder is unremarkable. Stomach/Bowel: Stomach is within normal limits. Appendix appears normal. No evidence of bowel wall thickening, distention, or inflammatory changes. Vascular/Lymphatic: Nonaneurysmal aorta.  No significant adenopathy. Reproductive: Uterus and bilateral adnexa are unremarkable. Other: Small free fluid in the pelvis.  No free air. Musculoskeletal: No acute or significant osseous findings. IMPRESSION: 1. 5.6 x 3.9 cm heterogenous solid enhancing mass in the left lower lung. Mass is not separable from the pericardium and appears to displace surrounding bronchi and left pulmonary fissure, suggesting that this represents a pericardial mass as opposed  to an intraparenchymal lung mass. 2. Few prominent lymph nodes adjacent to the aortic arch. 3. No CT evidence for acute intra-abdominal or pelvic abnormality. Small amount of free fluid in the pelvis. Electronically Signed   By: KDonavan FoilM.D.   On: 04/26/2018 01:59   Mr BJeri CosWAUContrast  Result Date: 05/23/2018 CLINICAL DATA:  Metastatic lung cancer. Surgical treatment 04/29/2018. EXAM: MRI HEAD WITHOUT AND WITH CONTRAST TECHNIQUE: Multiplanar, multiecho pulse sequences of the brain and surrounding structures were obtained without and with intravenous contrast. CONTRAST:  167mMULTIHANCE GADOBENATE DIMEGLUMINE 529 MG/ML IV SOLN COMPARISON:  04/30/2018.  04/26/2018. FINDINGS: Brain: Interval total resection of the solitary metastatic lesion at the left anterior inferior temporal lobe. Small post resection space with minimal enhancement along the edge. No sign of residual or recurrent tumor. Marked reduction in edema and mass effect. No second lesion. Mild chronic small-vessel ischemic changes of the cerebral hemispheric white matter appear the same. No hydrocephalus. Vascular: Major vessels at the base of the brain show flow. Skull and upper cervical spine: Negative Sinuses/Orbits: Clear/normal Other: None IMPRESSION: Total resection of a previously seen 3 cm metastasis at the left anterior temporal lobe. No evidence of residual disease or of any second lesion. Minimal enhancement along the margin of the resection cavity, not unexpected. This can be followed. Electronically Signed   By: MaNelson Chimes.D.   On: 05/23/2018 11:54   Mr BrJeri CosoQJontrast  Result Date: 05/01/2018 CLINICAL DATA:  Follow-up examination status post tumor resection. EXAM: MRI HEAD WITHOUT AND WITH CONTRAST TECHNIQUE: Multiplanar, multiecho pulse sequences of the brain and surrounding structures were obtained  without and with intravenous contrast. CONTRAST:  20m MULTIHANCE GADOBENATE DIMEGLUMINE 529 MG/ML IV SOLN COMPARISON:   Prior MRI from 04/26/2018. FINDINGS: Brain: Postoperative changes from interval left temporal craniotomy for tumor resection are seen. Small postoperative extra-axial collection measuring up to 4 mm overlies the anterior left temporal pole. Previously seen left temporal lobe mass has been resected, with postoperative blood products seen within the resection cavity. Fairly smooth serpiginous enhancement seen at the deep and posterior aspect of the resection cavity favored to be postoperative in nature (series 17001, image 67 no significant nodular or masslike enhancement about the resection cavity to suggest residual tumor identified. Resection margins are well demarcated with no evidence for Peri resection infarct or other complication. Persistent vasogenic edema with regional mass effect throughout the left frontotemporal region with partial effacement of the left lateral ventricle, overall slightly improved from previous with improved 5 mm of left-to-right shift (8 mm on preoperative MRI from 04/26/2018. Persistent mild crowding of the basilar cisterns which remain patent. Remainder the brain is otherwise stable in appearance. No evidence for acute infarct. No other mass lesion. No hydrocephalus or ventricular trapping. No other extra-axial fluid collection. Vascular: Major intracranial vascular flow voids are maintained. Skull and upper cervical spine: Craniocervical junction normal. Upper cervical spine normal. Post craniotomy changes present at the left temporal calvarium. Overlying postoperative scalp swelling and edema. Sinuses/Orbits: Globes and orbital soft tissues within normal limits. Paranasal sinuses are clear. No mastoid effusion. Inner ear structures normal. Other: None. IMPRESSION: 1. Postoperative changes from interval left temporal craniotomy for tumor resection without complication. There has been gross total resection of the left temporal lobe mass, with no appreciable residual tumor identified.  2. Persistent but improved vasogenic edema throughout the left frontotemporal region with persistent 5 mm of left-to-right midline shift. 3. Otherwise stable and unremarkable MRI of the brain. Electronically Signed   By: BJeannine BogaM.D.   On: 05/01/2018 02:08   Mr BJeri CosWVEContrast  Result Date: 04/26/2018 CLINICAL DATA:  Initial evaluation for intracranial tumor. EXAM: MRI HEAD WITHOUT AND WITH CONTRAST TECHNIQUE: Multiplanar, multiecho pulse sequences of the brain and surrounding structures were obtained without and with intravenous contrast. CONTRAST:  164mMULTIHANCE GADOBENATE DIMEGLUMINE 529 MG/ML IV SOLN COMPARISON:  Prior head CT from 04/25/2018 as well as prior chest CT from earlier the same day. FINDINGS: Brain: Previously identified mass positioned at the anterior left temporal lobe again seen, measuring 3.0 x 2.7 x 2.6 cm (AP by transverse by craniocaudad). Heterogeneous T2 signal abnormality with scattered foci of susceptibility artifact within the central aspect of the lesion consistent with necrosis. Lesion demonstrates avid postcontrast rim enhancement. Lesion is fairly well demarcated anteriorly and medially, but demonstrates somewhat infiltrative enhancement along its lateral and posterior aspect (series 11, image 71). Enhancement closely approximates the adjacent temporal horn of the left lateral ventricle without definite ependymal enhancement or intraventricular extension. Associated extensive vasogenic edema and regional mass effect throughout the left frontotemporal region with extension into the left cerebral peduncle and left midbrain. Mass effect on the left lateral ventricle which is partially effaced. Associated 8 mm of left-to-right midline shift with crowding of the basilar cisterns. No hydrocephalus or frank ventricular trapping. No other mass lesions or abnormal enhancement within the brain. Remainder of the brain is normal in appearance. No acute infarct. No other  mass lesion or abnormal enhancement. No extra-axial fluid collection. Major dural sinuses are grossly patent. Pituitary gland suprasellar region normal. Vascular: Major intracranial vascular flow  voids are well maintained. Skull and upper cervical spine: Craniocervical junction within normal limits. Upper cervical spine normal. Visualized osseous structures within normal limits. No discrete osseous lesions. Scalp soft tissues unremarkable. Sinuses/Orbits: Globes and orbital soft tissues within normal limits. Small right maxillary sinus retention cyst. Paranasal sinuses are otherwise clear. No mastoid effusion. Inner ear structures grossly normal. Other: None. IMPRESSION: 1. 3.0 x 2.7 x 2.6 cm rim enhancing mass at the anterior left temporal lobe. 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 2. Associated vasogenic edema with regional mass effect and 8 mm of left-to-right midline shift. Electronically Signed   By: Jeannine Boga M.D.   On: 04/26/2018 15:09   Ct Abdomen Pelvis W Contrast  Result Date: 04/26/2018 CLINICAL DATA:  Nausea vomiting with body ache EXAM: CT CHEST, ABDOMEN, AND PELVIS WITH CONTRAST TECHNIQUE: Multidetector CT imaging of the chest, abdomen and pelvis was performed following the standard protocol during bolus administration of intravenous contrast. CONTRAST:  137m ISOVUE-300 IOPAMIDOL (ISOVUE-300) INJECTION 61% COMPARISON:  Chest x-ray 04/25/2018 FINDINGS: CT CHEST FINDINGS Cardiovascular: Nonaneurysmal aorta. No pericardial effusion. Normal heart size. Mediastinum/Nodes: Subcentimeter hypodensity right lobe of thyroid. Midline trachea. 9 mm lymph node adjacent to the anterior arch. Esophagus within normal limits. Lungs/Pleura: No pleural effusion or pneumothorax. 5.6 x 3.9 cm heterogenous mass within the lingula. Musculoskeletal: No acute or suspicious abnormality. CT ABDOMEN PELVIS FINDINGS Hepatobiliary: No focal liver abnormality is seen.  No gallstones, gallbladder wall thickening, or biliary dilatation. Pancreas: Unremarkable. No pancreatic ductal dilatation or surrounding inflammatory changes. Spleen: Normal in size without focal abnormality. Adrenals/Urinary Tract: Adrenal glands are unremarkable. Kidneys are normal, without renal calculi, focal lesion, or hydronephrosis. Bladder is unremarkable. Stomach/Bowel: Stomach is within normal limits. Appendix appears normal. No evidence of bowel wall thickening, distention, or inflammatory changes. Vascular/Lymphatic: Nonaneurysmal aorta.  No significant adenopathy. Reproductive: Uterus and bilateral adnexa are unremarkable. Other: Small free fluid in the pelvis.  No free air. Musculoskeletal: No acute or significant osseous findings. IMPRESSION: 1. 5.6 x 3.9 cm heterogenous solid enhancing mass in the left lower lung. Mass is not separable from the pericardium and appears to displace surrounding bronchi and left pulmonary fissure, suggesting that this represents a pericardial mass as opposed to an intraparenchymal lung mass. 2. Few prominent lymph nodes adjacent to the aortic arch. 3. No CT evidence for acute intra-abdominal or pelvic abnormality. Small amount of free fluid in the pelvis. Electronically Signed   By: KDonavan FoilM.D.   On: 04/26/2018 01:59   Nm Pet Image Initial (pi) Skull Base To Thigh  Result Date: 05/13/2018 CLINICAL DATA:  Initial treatment strategy for metastatic lung cancer. EXAM: NUCLEAR MEDICINE PET SKULL BASE TO THIGH TECHNIQUE: 7.6 mCi F-18 FDG was injected intravenously. Full-ring PET imaging was performed from the skull base to thigh after the radiotracer. CT data was obtained and used for attenuation correction and anatomic localization. Fasting blood glucose: 85 mg/dl COMPARISON:  CT chest abdomen pelvis 04/26/2018. FINDINGS: Mediastinal blood pool activity: SUV max 1.9 NECK: No hypermetabolic lymph nodes in the neck. Incidental CT findings: None. CHEST: A mass at the  base of the left hemithorax straddles the left major fissure, involving the lingula and left lower lobe, with an SUV max of 26.3. Prevascular lymph node measures 8 mm with an SUV max of 3.0. No additional hypermetabolic mediastinal, hilar or axillary lymph nodes. No hypermetabolic pulmonary nodules. Incidental CT findings: No pericardial or pleural effusion. ABDOMEN/PELVIS: There is asymmetric hypermetabolism  within the left ovary. Otherwise, no abnormal hypermetabolism in the liver, adrenal glands, spleen or pancreas. No hypermetabolic lymph nodes. Incidental CT findings: Small pelvic free fluid. SKELETON: No abnormal osseous hypermetabolism. Incidental CT findings: Probable bone islands in the left iliac wing. IMPRESSION: 1. Hypermetabolic left upper/left lower lobe mass with a hypermetabolic AP window lymph node. Given recent resection a left temporal lobe metastasis, findings are consistent with stage IV lung cancer. 2. Asymmetric hypermetabolism within the left ovary is nonspecific. If further evaluation is desired, pelvic ultrasound could be performed. Metastatic disease cannot be definitively excluded. 3. Small pelvic free fluid. Electronically Signed   By: Lorin Picket M.D.   On: 05/13/2018 12:05   Ct Biopsy  Result Date: 04/27/2018 INDICATION: No known primary, now with left upper lobe/lingular mass and brain lesion worrisome for metastatic bronchogenic carcinoma versus infection. Please perform CT-guided biopsy for tissue diagnostic purposes. EXAM: CT-GUIDED BIOPSY OF LEFT UPPER LOBE PULMONARY MASS COMPARISON:  CT the chest, abdomen and pelvis - 04/26/2018 MEDICATIONS: None. ANESTHESIA/SEDATION: Fentanyl 50 mcg IV; Versed 1 mg IV Sedation time: 14 minutes; The patient was continuously monitored during the procedure by the interventional radiology nurse under my direct supervision. CONTRAST:  None COMPLICATIONS: None immediate. PROCEDURE: Informed consent was obtained from the patient following an  explanation of the procedure, risks, benefits and alternatives. The patient understands,agrees and consents for the procedure. All questions were addressed. A time out was performed prior to the initiation of the procedure. The patient was positioned supine, slightly RPO on the CT table and a limited chest CT was performed for procedural planning demonstrating no change to slight increase in size of the now approximately 5.8 x 4.1 cm mass within the left upper lobe crossing the left major fissure to the lingula, previously, 5.6 x 3.9 cm. The operative site was prepped and draped in the usual sterile fashion. Under sterile conditions and local anesthesia, a 17 gauge coaxial needle was advanced into the peripheral aspect of the nodule. Positioning was confirmed with intermittent CT fluoroscopy and followed by the acquisition of 4 core needle biopsies with an 18 gauge core needle biopsy device. Samples were set aside for both surgical pathologic as well as gram stain analysis. The coaxial needle was removed as approximately 2 cc of bloody fluid was aspirated. All aspirated fluid is capped and sent to the laboratory for analysis. Superficial hemostasis was achieved with manual compression. Limited post procedural chest CT was negative for pneumothorax or additional complication. A dressing was placed. The patient tolerated the procedure well without immediate postprocedural complication. The patient was escorted to have an upright chest radiograph. IMPRESSION: Technically successful CT guided core needle core biopsy of indeterminate left upper lobe/lingular mass. Samples were sent both for surgical pathologic as well as Gram stain analysis. Electronically Signed   By: Sandi Mariscal M.D.   On: 04/27/2018 16:36   Dg Chest Port 1 View  Result Date: 04/27/2018 CLINICAL DATA:  Post CT guided Biopsy of left upper pulmonary nodule/mass EXAM: PORTABLE CHEST 1 VIEW COMPARISON:  Chest x-ray dated 04/25/2018 and chest CT from  earlier today. FINDINGS: Again noted is the rounded mass at the LEFT lung base, unchanged in the short-term interval. No pleural effusion or pneumothorax appreciated status post today's CT-guided biopsy. Heart size and mediastinal contours are stable. IMPRESSION: No pleural effusion or pneumothorax seen status post today's CT-guided lung biopsy. Electronically Signed   By: Franki Cabot M.D.   On: 04/27/2018 17:22    ASSESSMENT AND  PLAN: This is a very pleasant 42 years old white female with highly suspicious metastatic malignant melanoma presented with large mass in the left upper/left lower lobe and mediastinal lymphadenopathy as well as solitary brain metastasis status post left temporal craniotomy and resection of tumor on 04/29/2018 and she is recovering well from her surgery. The patient is expected to start stereotactic radiotherapy to the resection cavity next week under the care of Dr. Lisbeth Renshaw. Her molecular studies by foundation 1 are still pending. The patient is not a good surgical candidate for resection of her lung tumor at this point because of the infiltrative nature of her tumor into the left upper lobe and left lower lobe as well as mediastinum. I had a lengthy discussion with the patient and her boyfriend about her current condition and treatment options.  The patient was given the option of palliative care versus consideration of treatment with immunotherapy with single agent Keytruda 200 mg IV every 3 weeks.  She is interested in treatment.  I discussed with the patient the adverse effect of the immunotherapy including but not limited to immunotherapy mediated skin rash, diarrhea, inflammation of the lung, kidney, liver, thyroid or other endocrine dysfunction including type 1 diabetes mellitus. She is expected to start the first dose of this treatment on 06/02/2018. I will arrange for the patient to have a chemotherapy education class before the first dose of her treatment. She will come  back for follow-up visit in 4 weeks for evaluation with the start of cycle #2. We will continue to taper her dose of Decadron. She was advised to call immediately if she has any concerning symptoms in the interval. The patient voices understanding of current disease status and treatment options and is in agreement with the current care plan.  All questions were answered. The patient knows to call the clinic with any problems, questions or concerns. We can certainly see the patient much sooner if necessary.  I spent 20 minutes counseling the patient face to face. The total time spent in the appointment was 30 minutes.  Disclaimer: This note was dictated with voice recognition software. Similar sounding words can inadvertently be transcribed and may not be corrected upon review.

## 2018-05-25 NOTE — Progress Notes (Signed)
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

## 2018-05-26 ENCOUNTER — Ambulatory Visit
Admission: RE | Admit: 2018-05-26 | Discharge: 2018-05-26 | Disposition: A | Discharge status: Home / Self Care | Payer: BLUE CROSS/BLUE SHIELD | Source: Ambulatory Visit | Attending: Radiation Oncology | Admitting: Radiation Oncology

## 2018-05-26 DIAGNOSIS — Z510 Encounter for antineoplastic radiation therapy: Secondary | ICD-10-CM | POA: Diagnosis not present

## 2018-05-26 DIAGNOSIS — C7931 Secondary malignant neoplasm of brain: Secondary | ICD-10-CM

## 2018-05-26 DIAGNOSIS — G939 Disorder of brain, unspecified: Secondary | ICD-10-CM | POA: Diagnosis not present

## 2018-05-26 NOTE — Progress Notes (Signed)
  Radiation Oncology         [phone]) [phone] ________________________________  Name: Leslie [name] MRN: [id number]  Date: 05/24/2018  DOB: 07-04-1976  DIAGNOSIS:     ICD-10-CM   1. Brain mass G93.9 sodium chloride flush (NS) 0.9 % injection 10 mL  2. Brain metastasis ([place]) C79.31     NARRATIVE:  The patient was brought to the [facility].  Identity was confirmed.  All relevant records and images related to the planned course of therapy were reviewed.  The patient freely provided informed written consent to proceed with treatment after reviewing the details related to the planned course of therapy. The consent form was witnessed and verified by the simulation staff. Intravenous access was established for contrast administration. Then, the patient was set-up in a stable reproducible supine position for radiation therapy.  A relocatable thermoplastic stereotactic head frame was fabricated for precise immobilization.  CT images were obtained.  Surface markings were placed.  The CT images were loaded into the planning software and fused with the patient's targeting MRI scan.  Then the target and avoidance structures were contoured.  Treatment planning then occurred.  The radiation prescription was entered and confirmed.  I have requested 3D planning  I have requested a DVH of the following structures: Brain stem, brain, left eye, right eye, lenses, optic chiasm, target volumes, uninvolved brain, and normal tissue.    SPECIAL TREATMENT PROCEDURE:  The planned course of therapy using radiation constitutes a special treatment procedure. Special care is required in the management of this patient for the following reasons. This treatment constitutes a Special Treatment Procedure for the following reason: High dose per fraction requiring special monitoring for increased toxicities of treatment including daily imaging.  The special nature of the planned course of radiotherapy will require  increased physician supervision and oversight to ensure patient's safety with optimal treatment outcomes.  PLAN:  The patient will receive 27 Gy in 3 fractions.   ------------------------------------------------  [name], MD, PhD

## 2018-05-26 NOTE — Progress Notes (Signed)
  Name: Leslie [name]  MRN: [id number]  Date: 05/26/2018   DOB: February 23, 1976  Stereotactic Radiosurgery Operative Note  PRE-OPERATIVE DIAGNOSIS:  Solitary Brain Metastasis  POST-OPERATIVE DIAGNOSIS:  Solitary Brain Metastasis  PROCEDURE:  Stereotactic Radiosurgery  SURGEON:  Charlie Pitter, MD  NARRATIVE: The patient underwent a radiation treatment planning session in the radiation oncology simulation suite under the care of the radiation oncology physician and physicist.  I participated closely in the radiation treatment planning afterwards. The patient underwent planning CT which was fused to 3T high resolution MRI with 1 mm axial slices.  These images were fused on the planning system.  We contoured the gross target volumes and subsequently expanded this to yield the Planning Target Volume. I actively participated in the planning process.  I helped to define and review the target contours and also the contours of the optic pathway, eyes, brainstem and selected nearby organs at risk.  All the dose constraints for critical structures were reviewed and compared to AAPM Task Group 101.  The prescription dose conformity was reviewed.  I approved the plan electronically.    Accordingly, Leslie [name] was brought to the TrueBeam stereotactic radiation treatment linac and placed in the custom immobilization mask.  The patient was aligned according to the IR fiducial markers with BrainLab Exactrac, then orthogonal x-rays were used in ExacTrac with the 6DOF robotic table and the shifts were made to align the patient  Leslie [name] received stereotactic radiosurgery uneventfully.    The detailed description of the procedure is recorded in the radiation oncology procedure note.  I was present for the duration of the procedure.  DISPOSITION:  Following delivery, the patient was transported to nursing in stable condition and monitored for possible acute effects to be discharged to home in  stable condition with follow-up in one month.  Charlie Pitter, MD 05/26/2018 8:30 AM

## 2018-05-26 NOTE — Progress Notes (Signed)
  Radiation Oncology         [phone] ________________________________  Name: Kalyani Maeda MRN: [id number]  Date: 05/26/2018  DOB: November 05, 1976   SPECIAL TREATMENT PROCEDURE   3D TREATMENT PLANNING AND DOSIMETRY: The patient's radiation plan was reviewed and approved by Dr. Annette Stable from neurosurgery and radiation oncology prior to treatment. It showed 3-dimensional radiation distributions overlaid onto the planning CT/MRI image set. The [facility] for the target structures as well as the organs at risk were reviewed. The documentation of the 3D plan and dosimetry are filed in the radiation oncology EMR.   NARRATIVE: The patient was brought to the TrueBeam stereotactic radiation treatment machine and placed supine on the CT couch. The head frame was applied, and the patient was set up for stereotactic radiosurgery. Neurosurgery was present for the set-up and delivery   SIMULATION VERIFICATION: In the couch zero-angle position, the patient underwent Exactrac imaging using the Brainlab system with orthogonal KV images. These were carefully aligned and repeated to confirm treatment position for each of the isocenters. The Exactrac snap film verification was repeated at each couch angle.   SPECIAL TREATMENT PROCEDURE: The patient received stereotactic radiosurgery to the following target:  PTV1 postop left temporal target was treated using 6 Arcs to a prescription dose of 9 Gy. ExacTrac Snap verification was performed for each couch angle.   STEREOTACTIC TREATMENT MANAGEMENT: Following delivery, the patient was transported to nursing in stable condition and monitored for possible acute effects. Vital signs were recorded . The patient tolerated treatment without significant acute effects, and was discharged to home in stable condition.  PLAN: the patient will continue with her second fraction out of 3 early next week.  ------------------------------------------------  [name], MD, PhD

## 2018-05-27 ENCOUNTER — Other Ambulatory Visit: Payer: Self-pay | Admitting: *Deleted

## 2018-05-27 ENCOUNTER — Inpatient Hospital Stay: Payer: BLUE CROSS/BLUE SHIELD

## 2018-05-27 DIAGNOSIS — C7931 Secondary malignant neoplasm of brain: Secondary | ICD-10-CM | POA: Diagnosis not present

## 2018-05-27 LAB — FUNGAL ORGANISM REFLEX

## 2018-05-27 LAB — FUNGUS CULTURE WITH STAIN

## 2018-05-27 LAB — FUNGUS CULTURE RESULT

## 2018-05-27 MED ORDER — PROCHLORPERAZINE MALEATE 10 MG PO TABS: 10.0000 mg | ORAL_TABLET | Freq: Four times a day (QID) | ORAL | 0 refills | Status: DC | PRN

## 2018-05-27 NOTE — Progress Notes (Signed)
Compazine sent to pt pharmacy

## 2018-05-30 ENCOUNTER — Inpatient Hospital Stay: Payer: BLUE CROSS/BLUE SHIELD | Admitting: Oncology

## 2018-05-30 ENCOUNTER — Inpatient Hospital Stay: Payer: BLUE CROSS/BLUE SHIELD

## 2018-05-30 ENCOUNTER — Ambulatory Visit
Admission: RE | Admit: 2018-05-30 | Discharge: 2018-05-30 | Disposition: A | Discharge status: Home / Self Care | Payer: BLUE CROSS/BLUE SHIELD | Source: Ambulatory Visit | Attending: Radiation Oncology | Admitting: Radiation Oncology

## 2018-05-30 DIAGNOSIS — C7931 Secondary malignant neoplasm of brain: Secondary | ICD-10-CM | POA: Diagnosis not present

## 2018-05-30 DIAGNOSIS — Z510 Encounter for antineoplastic radiation therapy: Secondary | ICD-10-CM | POA: Diagnosis not present

## 2018-05-30 DIAGNOSIS — G939 Disorder of brain, unspecified: Secondary | ICD-10-CM | POA: Diagnosis not present

## 2018-05-30 NOTE — Progress Notes (Signed)
Leslie [name] arrived ambulatory with husband at side to nursing exam room 10, s/p SRS Brain  x2  Vital signs taken  Vitals:   05/30/18 1222 05/30/18 1248  BP: 107/81 102/74  Pulse: 82 82  Resp: 18 18  Temp: 97.8 F (36.6 C) 98.6 F (37 C)  TempSrc: Oral Oral  SpO2: 100% 100%    Denies headache, dizziness, nausea, diplopia or ringing in the ears. Monitored for 15 minutes and vital signs stable  Patient knows to call for any unusual symptoms: increased headache that won't go away, increased fever > 100.5, increased vomiting, vision changes, to avoid strenuous activity and heavy lifting for the next 24 hours, and to call [phone] should any issues arise.   Denies headache, dizziness, nausea, diplopia or ringing in the ears. Talking with her family members. Discharged home with family members ambulatory upstairs to waiting area for the car.

## 2018-05-31 ENCOUNTER — Encounter (HOSPITAL_COMMUNITY): Payer: Self-pay | Admitting: Internal Medicine

## 2018-06-01 ENCOUNTER — Encounter: Payer: Self-pay | Admitting: Internal Medicine

## 2018-06-01 ENCOUNTER — Ambulatory Visit
Admission: RE | Admit: 2018-06-01 | Discharge: 2018-06-01 | Disposition: A | Discharge status: Home / Self Care | Payer: BLUE CROSS/BLUE SHIELD | Source: Ambulatory Visit | Attending: Radiation Oncology | Admitting: Radiation Oncology

## 2018-06-01 ENCOUNTER — Encounter: Payer: Self-pay | Admitting: Radiation Oncology

## 2018-06-01 VITALS — BP 106/73 | HR 78 | Temp 98.7°F | Resp 18

## 2018-06-01 DIAGNOSIS — Z510 Encounter for antineoplastic radiation therapy: Secondary | ICD-10-CM | POA: Diagnosis not present

## 2018-06-01 DIAGNOSIS — C7931 Secondary malignant neoplasm of brain: Secondary | ICD-10-CM | POA: Diagnosis not present

## 2018-06-01 DIAGNOSIS — G939 Disorder of brain, unspecified: Secondary | ICD-10-CM | POA: Diagnosis not present

## 2018-06-01 NOTE — Progress Notes (Signed)
Leslie [name] rested with Leslie [name] for 15 minutes following her final SRS treatment.  Patient denies headache, dizziness, nausea, diplopia or ringing in the ears. Denies fatigue.  Patient was given instructions by Dr. Lisbeth Renshaw and was given a one month follow-up appointment.  Patient without complaints. Understands to avoid strenuous activity for the next 24 hours and call [phone] with needs.   12:29: BP 107/79   Pulse 79   Temp 97.9 F (36.6 C) (Oral)   Resp 18   SpO2 99%     12:45: BP 106/73   Pulse 78   Temp 98.7 F (37.1 C) (Oral)   Resp 18   SpO2 100%     Leslie [name], BSN

## 2018-06-01 NOTE — Progress Notes (Signed)
Met w/ pt to introduce myself as her Arboriculturist and to discuss copay assistance for [place].  I completed the application, got hers and Dr. Worthy Flank signature and faxed the application to [employer] for processing.  Once I hear back from the foundation I will notify the pt of the outcome.  Pt is overqualified for the [employer].   She has my card for any questions or concerns she may have in the future.

## 2018-06-02 ENCOUNTER — Inpatient Hospital Stay: Payer: BLUE CROSS/BLUE SHIELD

## 2018-06-02 VITALS — BP 105/68 | HR 78 | Temp 98.2°F | Resp 18

## 2018-06-02 DIAGNOSIS — C7931 Secondary malignant neoplasm of brain: Secondary | ICD-10-CM

## 2018-06-02 DIAGNOSIS — Z79899 Other long term (current) drug therapy: Secondary | ICD-10-CM | POA: Diagnosis not present

## 2018-06-02 DIAGNOSIS — C801 Malignant (primary) neoplasm, unspecified: Secondary | ICD-10-CM | POA: Diagnosis not present

## 2018-06-02 DIAGNOSIS — C7802 Secondary malignant neoplasm of left lung: Secondary | ICD-10-CM

## 2018-06-02 DIAGNOSIS — Z5112 Encounter for antineoplastic immunotherapy: Secondary | ICD-10-CM | POA: Diagnosis not present

## 2018-06-02 DIAGNOSIS — R5382 Chronic fatigue, unspecified: Secondary | ICD-10-CM

## 2018-06-02 LAB — CMP (CANCER CENTER ONLY)
ALT: 9 U/L (ref 0–55)
AST: 12 U/L (ref 5–34)
Albumin: 3 g/dL — ABNORMAL LOW (ref 3.5–5.0)
Alkaline Phosphatase: 128 U/L (ref 40–150)
Anion gap: 9 (ref 3–11)
BUN: 15 mg/dL (ref 7–26)
CO2: 28 mmol/L (ref 22–29)
Calcium: 9.5 mg/dL (ref 8.4–10.4)
Chloride: 100 mmol/L (ref 98–109)
Creatinine: 0.72 mg/dL (ref 0.60–1.10)
GFR, Est AFR Am: >60 mL/min (ref >60)
GFR, Estimated: >60 mL/min (ref >60)
Glucose, Bld: 94 mg/dL (ref 70–140)
Potassium: 4.4 mmol/L (ref 3.5–5.1)
Sodium: 137 mmol/L (ref 136–145)
Total Bilirubin: 0.2 mg/dL (ref 0.2–1.2)
Total Protein: 7.4 g/dL (ref 6.4–8.3)

## 2018-06-02 LAB — CBC WITH DIFFERENTIAL (CANCER CENTER ONLY)
Basophils Absolute: 0 10*3/uL (ref 0.0–0.1)
Basophils Relative: 0 %
Eosinophils Absolute: 0 10*3/uL (ref 0.0–0.5)
Eosinophils Relative: 0 %
HCT: 37.3 % (ref 34.8–46.6)
Hemoglobin: 12.1 g/dL (ref 11.6–15.9)
Lymphocytes Relative: 8 %
Lymphs Abs: 2.2 10*3/uL (ref 0.9–3.3)
MCH: 29.2 pg (ref 25.1–34.0)
MCHC: 32.4 g/dL (ref 31.5–36.0)
MCV: 90.1 fL (ref 79.5–101.0)
Monocytes Absolute: 0.8 10*3/uL (ref 0.1–0.9)
Monocytes Relative: 3 %
Neutro Abs: 24.4 10*3/uL — ABNORMAL HIGH (ref 1.5–6.5)
Neutrophils Relative %: 89 %
Platelet Count: 397 10*3/uL (ref 145–400)
RBC: 4.14 MIL/uL (ref 3.70–5.45)
RDW: 12.9 % (ref 11.2–14.5)
WBC Count: 27.4 10*3/uL — ABNORMAL HIGH (ref 3.9–10.3)

## 2018-06-02 LAB — TSH: TSH: 1.373 u[IU]/mL (ref 0.308–3.960)

## 2018-06-02 MED ORDER — SODIUM CHLORIDE 0.9 % IV SOLN
Freq: Once | INTRAVENOUS | Status: AC
  Administered 2018-06-02: 15:00:00 via INTRAVENOUS

## 2018-06-02 MED ORDER — SODIUM CHLORIDE 0.9 % IV SOLN
200.0000 mg | Freq: Once | INTRAVENOUS | Status: AC
  Administered 2018-06-02: 200 mg via INTRAVENOUS
  Filled 2018-06-02: qty 8

## 2018-06-02 NOTE — Patient Instructions (Signed)
[facility] Discharge Instructions for Patients Receiving Chemotherapy  Today you received the following chemotherapy agents :  Keytruda.  To help prevent nausea and vomiting after your treatment, we encourage you to take your nausea medication as prescribed.   If you develop nausea and vomiting that is not controlled by your nausea medication, call the clinic.   BELOW ARE SYMPTOMS THAT SHOULD BE REPORTED IMMEDIATELY:  *FEVER GREATER THAN 100.5 F  *CHILLS WITH OR WITHOUT FEVER  NAUSEA AND VOMITING THAT IS NOT CONTROLLED WITH YOUR NAUSEA MEDICATION  *UNUSUAL SHORTNESS OF BREATH  *UNUSUAL BRUISING OR BLEEDING  TENDERNESS IN MOUTH AND THROAT WITH OR WITHOUT PRESENCE OF ULCERS  *URINARY PROBLEMS  *BOWEL PROBLEMS  UNUSUAL RASH Items with * indicate a potential emergency and should be followed up as soon as possible.  Feel free to call the clinic should you have any questions or concerns. The clinic phone number is [phone].  Please show the [facility] at check-in to the Emergency Department and triage nurse.   Pembrolizumab injection What is this medicine? PEMBROLIZUMAB (pem broe liz ue mab) is a monoclonal antibody. It is used to treat melanoma, head and neck cancer, Hodgkin lymphoma, non-small cell lung cancer, urothelial cancer, stomach cancer, and cancers that have a certain genetic condition. This medicine may be used for other purposes; ask your health care provider or pharmacist if you have questions. COMMON BRAND NAME(S): Keytruda What should I tell my health care provider before I take this medicine? They need to know if you have any of these conditions: -diabetes -immune system problems -inflammatory bowel disease -liver disease -lung or breathing disease -lupus -organ transplant -an unusual or allergic reaction to pembrolizumab, other medicines, foods, dyes, or preservatives -pregnant or trying to get pregnant -breast-feeding How  should I use this medicine? This medicine is for infusion into a vein. It is given by a health care professional in a hospital or clinic setting. A special MedGuide will be given to you before each treatment. Be sure to read this information carefully each time. Talk to your pediatrician regarding the use of this medicine in children. While this drug may be prescribed for selected conditions, precautions do apply. Overdosage: If you think you have taken too much of this medicine contact a poison control center or emergency room at once. NOTE: This medicine is only for you. Do not share this medicine with others. What if I miss a dose? It is important not to miss your dose. Call your doctor or health care professional if you are unable to keep an appointment. What may interact with this medicine? Interactions have not been studied. Give your health care provider a list of all the medicines, herbs, non-prescription drugs, or dietary supplements you use. Also tell them if you smoke, drink alcohol, or use illegal drugs. Some items may interact with your medicine. This list may not describe all possible interactions. Give your health care provider a list of all the medicines, herbs, non-prescription drugs, or dietary supplements you use. Also tell them if you smoke, drink alcohol, or use illegal drugs. Some items may interact with your medicine. What should I watch for while using this medicine? Your condition will be monitored carefully while you are receiving this medicine. You may need blood work done while you are taking this medicine. Do not become pregnant while taking this medicine or for 4 months after stopping it. Women should inform their doctor if they wish to become pregnant or  think they might be pregnant. There is a potential for serious side effects to an unborn child. Talk to your health care professional or pharmacist for more information. Do not breast-feed an infant while taking this  medicine or for 4 months after the last dose. What side effects may I notice from receiving this medicine? Side effects that you should report to your doctor or health care professional as soon as possible: -allergic reactions like skin rash, itching or hives, swelling of the face, lips, or tongue -bloody or black, tarry -breathing problems -changes in vision -chest pain -chills -constipation -cough -dizziness or feeling faint or lightheaded -fast or irregular heartbeat -fever -flushing -hair loss -low blood counts - this medicine may decrease the number of white blood cells, red blood cells and platelets. You may be at increased risk for infections and bleeding. -muscle pain -muscle weakness -persistent headache -signs and symptoms of high blood sugar such as dizziness; dry mouth; dry skin; fruity breath; nausea; stomach pain; increased hunger or thirst; increased urination -signs and symptoms of kidney injury like trouble passing urine or change in the amount of urine -signs and symptoms of liver injury like dark urine, light-colored stools, loss of appetite, nausea, right upper belly pain, yellowing of the eyes or skin -stomach pain -sweating -weight loss Side effects that usually do not require medical attention (report to your doctor or health care professional if they continue or are bothersome): -decreased appetite -diarrhea -tiredness This list may not describe all possible side effects. Call your doctor for medical advice about side effects. You may report side effects to FDA at 1-800-FDA-1088. Where should I keep my medicine? This drug is given in a hospital or clinic and will not be stored at home. NOTE: This sheet is a summary. It may not cover all possible information. If you have questions about this medicine, talk to your doctor, pharmacist, or health care provider.  2018 Elsevier/Gold Standard (2016-09-08 12:29:36)

## 2018-06-02 NOTE — Progress Notes (Signed)
  Radiation Oncology         [phone] ________________________________  Name: Caitrin Pendergraph MRN: [id number]  Date: 06/01/2018  DOB: 1976/04/28   SPECIAL TREATMENT PROCEDURE   3D TREATMENT PLANNING AND DOSIMETRY: The patient's radiation plan was reviewed and approved by Dr. Annette Stable from neurosurgery and radiation oncology prior to treatment. It showed 3-dimensional radiation distributions overlaid onto the planning CT/MRI image set. The [facility] for the target structures as well as the organs at risk were reviewed. The documentation of the 3D plan and dosimetry are filed in the radiation oncology EMR.   NARRATIVE: The patient was brought to the TrueBeam stereotactic radiation treatment machine and placed supine on the CT couch. The head frame was applied, and the patient was set up for stereotactic radiosurgery. Neurosurgery was present for the set-up and delivery   SIMULATION VERIFICATION: In the couch zero-angle position, the patient underwent Exactrac imaging using the Brainlab system with orthogonal KV images. These were carefully aligned and repeated to confirm treatment position for each of the isocenters. The Exactrac snap film verification was repeated at each couch angle.   SPECIAL TREATMENT PROCEDURE: The patient received stereotactic radiosurgery to the following target:  PTV1 postop left temporal target was treated using 6 Arcs to a prescription dose of 9 Gy. ExacTrac Snap verification was performed for each couch angle. The patient has completed 27 Gy in 3 fractions.  STEREOTACTIC TREATMENT MANAGEMENT: Following delivery, the patient was transported to nursing in stable condition and monitored for possible acute effects. Vital signs were recorded . The patient tolerated treatment without significant acute effects, and was discharged to home in stable condition.  PLAN: Follow-up in one month.   ------------------------------------------------  [name], MD, PhD

## 2018-06-03 ENCOUNTER — Telehealth: Payer: Self-pay | Admitting: Medical Oncology

## 2018-06-03 NOTE — Telephone Encounter (Addendum)
F/U phone call Keytruda and test results.  Fatigue- she has questions about what it is from and how long will she have it. Per Julien Nordmann I told pt her fatigue is not from [place], but may be from decreasing decadron dose. [name] said to contact Dr Trenton Gammon about how long to stay on decadron. I also told her the molecular studies were not helpful -no reported alterations.-and Julien Nordmann was keeping her on Keytruda.

## 2018-06-07 ENCOUNTER — Telehealth: Payer: Self-pay | Admitting: Medical Oncology

## 2018-06-07 NOTE — Telephone Encounter (Signed)
Faxed molecular results and path review from [place] and [facility].

## 2018-06-08 DIAGNOSIS — C712 Malignant neoplasm of temporal lobe: Secondary | ICD-10-CM | POA: Diagnosis not present

## 2018-06-08 DIAGNOSIS — C801 Malignant (primary) neoplasm, unspecified: Secondary | ICD-10-CM | POA: Diagnosis not present

## 2018-06-08 NOTE — Progress Notes (Signed)
  Radiation Oncology         [phone]) [phone] ________________________________  Name: Leslie [name] MRN: [id number]  Date: 06/01/2018  DOB: June 11, 1976  End of Treatment Note  Diagnosis:   42 y.o. 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   There were no signs of acute toxicity after treatment.  Plan: The patient has completed radiation treatment. The patient will return to radiation oncology clinic for routine followup in one month. I advised the patient to call or return sooner if they have any questions or concerns related to their recovery or treatment. ________________________________  [name], MD, PhD  This document serves as a record of services personally performed by Kyung Rudd, MD. It was created on his behalf by Rae Lips, a trained medical scribe. The creation of this record is based on the scribe's personal observations and the provider's statements to them. This document has been checked and approved by the attending provider.

## 2018-06-09 ENCOUNTER — Telehealth: Payer: Self-pay | Admitting: Medical Oncology

## 2018-06-09 DIAGNOSIS — C801 Malignant (primary) neoplasm, unspecified: Secondary | ICD-10-CM | POA: Diagnosis not present

## 2018-06-09 DIAGNOSIS — C7931 Secondary malignant neoplasm of brain: Secondary | ICD-10-CM | POA: Diagnosis not present

## 2018-06-09 DIAGNOSIS — C439 Malignant melanoma of skin, unspecified: Secondary | ICD-10-CM | POA: Diagnosis not present

## 2018-06-09 LAB — ACID FAST CULTURE WITH REFLEXED SENSITIVITIES (MYCOBACTERIA): Acid Fast Culture: NEGATIVE

## 2018-06-09 NOTE — Telephone Encounter (Signed)
Health insurance form ready for pick up.

## 2018-06-09 NOTE — Telephone Encounter (Signed)
Mailed health form to pt.

## 2018-06-10 DIAGNOSIS — M9902 Segmental and somatic dysfunction of thoracic region: Secondary | ICD-10-CM | POA: Diagnosis not present

## 2018-06-10 DIAGNOSIS — M62838 Other muscle spasm: Secondary | ICD-10-CM | POA: Diagnosis not present

## 2018-06-10 DIAGNOSIS — M9901 Segmental and somatic dysfunction of cervical region: Secondary | ICD-10-CM | POA: Diagnosis not present

## 2018-06-10 DIAGNOSIS — M2560 Stiffness of unspecified joint, not elsewhere classified: Secondary | ICD-10-CM | POA: Diagnosis not present

## 2018-06-13 ENCOUNTER — Encounter: Payer: Self-pay | Admitting: Internal Medicine

## 2018-06-13 NOTE — Progress Notes (Signed)
Pt was approved w/ [employer] for [employer] for $25,000 from 12/14/17 - 12/13/18.  Her copay for Leslie [name] will be $25.

## 2018-06-20 ENCOUNTER — Telehealth: Payer: Self-pay | Admitting: Hematology

## 2018-06-20 NOTE — Telephone Encounter (Signed)
Left vm for pt re labs needing to be moved.

## 2018-06-23 ENCOUNTER — Inpatient Hospital Stay: Payer: BLUE CROSS/BLUE SHIELD | Attending: Internal Medicine

## 2018-06-23 ENCOUNTER — Inpatient Hospital Stay: Payer: BLUE CROSS/BLUE SHIELD

## 2018-06-23 ENCOUNTER — Encounter: Payer: Self-pay | Admitting: Nurse Practitioner

## 2018-06-23 ENCOUNTER — Inpatient Hospital Stay (HOSPITAL_BASED_OUTPATIENT_CLINIC_OR_DEPARTMENT_OTHER): Payer: BLUE CROSS/BLUE SHIELD | Admitting: Nurse Practitioner

## 2018-06-23 VITALS — BP 114/73 | HR 88 | Temp 98.6°F | Resp 18 | Ht 69.0 in | Wt 137.9 lb

## 2018-06-23 DIAGNOSIS — Z5112 Encounter for antineoplastic immunotherapy: Secondary | ICD-10-CM | POA: Insufficient documentation

## 2018-06-23 DIAGNOSIS — C3492 Malignant neoplasm of unspecified part of left bronchus or lung: Secondary | ICD-10-CM | POA: Diagnosis not present

## 2018-06-23 DIAGNOSIS — R5382 Chronic fatigue, unspecified: Secondary | ICD-10-CM

## 2018-06-23 DIAGNOSIS — Z79899 Other long term (current) drug therapy: Secondary | ICD-10-CM | POA: Diagnosis not present

## 2018-06-23 DIAGNOSIS — C7802 Secondary malignant neoplasm of left lung: Secondary | ICD-10-CM

## 2018-06-23 DIAGNOSIS — Z9221 Personal history of antineoplastic chemotherapy: Secondary | ICD-10-CM | POA: Diagnosis not present

## 2018-06-23 DIAGNOSIS — C7931 Secondary malignant neoplasm of brain: Secondary | ICD-10-CM

## 2018-06-23 LAB — CBC WITH DIFFERENTIAL (CANCER CENTER ONLY)
Basophils Absolute: 0.1 10*3/uL (ref 0.0–0.1)
Basophils Relative: 0 %
Eosinophils Absolute: 0.4 10*3/uL (ref 0.0–0.5)
Eosinophils Relative: 2 %
HCT: 34.6 % — ABNORMAL LOW (ref 34.8–46.6)
Hemoglobin: 11.4 g/dL — ABNORMAL LOW (ref 11.6–15.9)
Lymphocytes Relative: 8 %
Lymphs Abs: 1.3 10*3/uL (ref 0.9–3.3)
MCH: 27.6 pg (ref 25.1–34.0)
MCHC: 32.9 g/dL (ref 31.5–36.0)
MCV: 83.7 fL (ref 79.5–101.0)
Monocytes Absolute: 1 10*3/uL — ABNORMAL HIGH (ref 0.1–0.9)
Monocytes Relative: 6 %
Neutro Abs: 13.6 10*3/uL — ABNORMAL HIGH (ref 1.5–6.5)
Neutrophils Relative %: 84 %
Platelet Count: 591 10*3/uL — ABNORMAL HIGH (ref 145–400)
RBC: 4.14 MIL/uL (ref 3.70–5.45)
RDW: 13.5 % (ref 11.2–14.5)
WBC Count: 16.4 10*3/uL — ABNORMAL HIGH (ref 3.9–10.3)

## 2018-06-23 LAB — CMP (CANCER CENTER ONLY)
ALT: 10 U/L (ref 0–44)
AST: 15 U/L (ref 15–41)
Albumin: 2.9 g/dL — ABNORMAL LOW (ref 3.5–5.0)
Alkaline Phosphatase: 108 U/L (ref 38–126)
Anion gap: 9 (ref 5–15)
BUN: 8 mg/dL (ref 6–20)
CO2: 25 mmol/L (ref 22–32)
Calcium: 9.5 mg/dL (ref 8.9–10.3)
Chloride: 106 mmol/L (ref 98–111)
Creatinine: 0.71 mg/dL (ref 0.44–1.00)
GFR, Est AFR Am: >60 mL/min (ref >60)
GFR, Estimated: >60 mL/min (ref >60)
Glucose, Bld: 76 mg/dL (ref 70–99)
Potassium: 4.4 mmol/L (ref 3.5–5.1)
Sodium: 140 mmol/L (ref 135–145)
Total Bilirubin: <0.2 mg/dL — ABNORMAL LOW (ref 0.3–1.2)
Total Protein: 7.2 g/dL (ref 6.5–8.1)

## 2018-06-23 LAB — TSH: TSH: 1.62 u[IU]/mL (ref 0.308–3.960)

## 2018-06-23 MED ORDER — PEMBROLIZUMAB CHEMO INJECTION 100 MG/4ML
200.0000 mg | Freq: Once | INTRAVENOUS | Status: AC
  Administered 2018-06-23: 200 mg via INTRAVENOUS
  Filled 2018-06-23: qty 8

## 2018-06-23 MED ORDER — SODIUM CHLORIDE 0.9 % IV SOLN
Freq: Once | INTRAVENOUS | Status: AC
  Administered 2018-06-23: 10:00:00 via INTRAVENOUS

## 2018-06-23 NOTE — Patient Instructions (Signed)
[facility] Discharge Instructions for Patients Receiving Chemotherapy  Today you received the following chemotherapy agents :  Keytruda.  To help prevent nausea and vomiting after your treatment, we encourage you to take your nausea medication as prescribed.   If you develop nausea and vomiting that is not controlled by your nausea medication, call the clinic.   BELOW ARE SYMPTOMS THAT SHOULD BE REPORTED IMMEDIATELY:  *FEVER GREATER THAN 100.5 F  *CHILLS WITH OR WITHOUT FEVER  NAUSEA AND VOMITING THAT IS NOT CONTROLLED WITH YOUR NAUSEA MEDICATION  *UNUSUAL SHORTNESS OF BREATH  *UNUSUAL BRUISING OR BLEEDING  TENDERNESS IN MOUTH AND THROAT WITH OR WITHOUT PRESENCE OF ULCERS  *URINARY PROBLEMS  *BOWEL PROBLEMS  UNUSUAL RASH Items with * indicate a potential emergency and should be followed up as soon as possible.  Feel free to call the clinic should you have any questions or concerns. The clinic phone number is [phone].  Please show the [facility] at check-in to the Emergency Department and triage nurse.

## 2018-06-23 NOTE — Progress Notes (Signed)
[facility]  [phone] [phone]  Clinic Follow up Note   Patient Care Team: System, Pcp Not In as PCP - General 06/23/2018  SUMMARY OF ONCOLOGIC HISTORY:   Brain metastasis ([place])   05/02/2018 Initial Diagnosis    Brain metastasis ([place])      06/02/2018 -  Chemotherapy    The patient had pembrolizumab (KEYTRUDA) 200 mg in sodium chloride 0.9 % 50 mL chemo infusion, 200 mg, Intravenous, Once, 2 of 6 cycles Administration: 200 mg (06/02/2018), 200 mg (06/23/2018)  for chemotherapy treatment.        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      DIAGNOSIS: Metastatic high-grade neoplasm highly suspicious for metastatic malignant melanoma presented with large left left upper/left lower lobe mass with a hypermetabolic AP window lymph node as well as solitary metastatic brain lesion diagnosed in May 2019.  PRIOR THERAPY: left temporal craniotomy with resection of tumor with intraoperative stereotactic guidance for volumetric resection under the care of Dr. Annette Stable on 04/29/2018.  CURRENT THERAPY: First-line treatment with immunotherapy with Keytruda 200 mg IV every 3 weeks.  First dose June 02, 2018; s/p 1 cycle    INTERVAL HISTORY: [name] returns for follow up and cycle 2 Keytruda as scheduled. She completed cycle 1 on 06/02/18. She met with Dr. Aniceto Boss at [facility] on 06/09/18. She had "overwhelming" fatigue for approximately 2 weeks after her first infusion. She developed mild dry cough, shortness of breath, and sternal pressure, occurring mostly in the mornings or with extensive conversation. This improved as the day  progressed and with hydration. She notes symptoms are not "extensive or consistent." Denies hemoptysis or wheezing. She had transient headaches when fatigue was worse few days after first treatment. Has sporadic skin itching but no rash. Occasional mild chills and sweats, no fever. She has felt well for the last 6 days, energy level and appetite are improved. Taste is decreased. Eats small, frequent amounts. She continuously performs yoga and has restarted light weigh lifting again, which has been good for her physical and mental state. She is off steroids since 6/19.   REVIEW OF SYSTEMS:   Constitutional: Denies fevers or abnormal weight loss (+) intermittent chills, sweats (+) low appetite, improving (+) increased fatigue 2 weeks after [place], now improved over last week   Ears, nose, mouth, throat, and face: Denies mucositis or sore throat (+) AM dry throat  Respiratory: Denies hemoptysis or wheezes (+) periodic nonproductive cough (+) shortness of breath with cough, improved (+) sternal pressure with cough Cardiovascular: Denies palpitation, chest discomfort or lower extremity swelling Gastrointestinal:  Denies nausea, vomiting, constipation, diarrhea, heartburn or change in bowel habits Skin: Denies abnormal skin rashes (+) transient itching  Lymphatics: Denies new lymphadenopathy or easy bruising Neurological:Denies numbness, tingling or new weaknesses Behavioral/Psych: Mood is stable, no new changes  All other systems were reviewed with the patient and are negative.  MEDICAL HISTORY:  Past Medical History:  Diagnosis Date  . Migraines   . Yeast infection     SURGICAL HISTORY: Past Surgical History:  Procedure Laterality Date  . APPLICATION OF CRANIAL NAVIGATION Left 04/29/2018  Procedure: APPLICATION OF CRANIAL NAVIGATION;  Surgeon: Earnie Larsson, MD;  Location: [facility];  Service: Neurosurgery;  Laterality: Left;  . CRANIOTOMY Left 04/29/2018   Procedure: LEFT CRANIOTOMY FOR  TUMOR  BRAIN LAB;  Surgeon: Earnie Larsson, MD;  Location: [facility];  Service: Neurosurgery;  Laterality: Left;    I have reviewed the social history and family history with the patient and they are unchanged from previous note.  ALLERGIES:  has No Known Allergies.  MEDICATIONS:  Current Outpatient Medications  Medication Sig Dispense Refill  . Ascorbic Acid (LIQUID C 500) 500 MG/15ML LIQD Take 1,000 mg by mouth daily.    Marland Kitchen GAMMA AMINOBUTYRIC ACID PO Take by mouth.    Marland Kitchen HCA CALCIUM-MAGNESIUM-ZINC PO Take by mouth. Taking one half tablespoon daily    . Melatonin 5 MG CAPS Take by mouth.    . Multiple Vitamin (MULTIVITAMIN) tablet Take 1 tablet by mouth daily.    . Omega 3-6-9 Fatty Acids (OMEGA 3-6-9 COMPLEX PO) Take 1 capsule by mouth daily.    Marland Kitchen OVER THE COUNTER MEDICATION     . prochlorperazine (COMPAZINE) 10 MG tablet Take 1 tablet (10 mg total) by mouth every 6 (six) hours as needed for nausea or vomiting. (Patient not taking: Reported on 06/23/2018) 30 tablet 0   No current facility-administered medications for this visit.     PHYSICAL EXAMINATION: ECOG PERFORMANCE STATUS: 1 - Symptomatic but completely ambulatory  Vitals:   06/23/18 0857  BP: 114/73  Pulse: 88  Resp: 18  Temp: 98.6 F (37 C)  SpO2: 100%   Filed Weights   06/23/18 0857  Weight: 137 lb 14.4 oz (62.6 kg)    GENERAL:alert, no distress and comfortable SKIN: no rashes or significant lesions. Multiple nevi EYES: normal, Conjunctiva are pink and non-injected, sclera clear OROPHARYNX:no thrush or ulcers NECK: supple, thyroid normal size, non-tender, without nodularity LYMPH:  no palpable cervical or supraclavicular lymphadenopathy  LUNGS: clear to auscultation with normal breathing effort HEART: regular rate & rhythm and no murmurs and no lower extremity edema ABDOMEN:abdomen soft, non-tender and normal bowel sounds Musculoskeletal:no cyanosis of digits and no clubbing  NEURO: alert & oriented x 3 with fluent speech,  no focal motor/sensory deficits  LABORATORY DATA:  I have reviewed the data as listed CBC Latest Ref Rng & Units 06/23/2018 06/02/2018 05/11/2018  WBC 3.9 - 10.3 K/uL 16.4(H) 27.4(H) 20.3(H)  Hemoglobin 11.6 - 15.9 g/dL 11.4(L) 12.1 13.0  Hematocrit 34.8 - 46.6 % 34.6(L) 37.3 39.8  Platelets 145 - 400 K/uL 591(H) 397 344     CMP Latest Ref Rng & Units 06/23/2018 06/02/2018 05/11/2018  Glucose 70 - 99 mg/dL 76 94 80  BUN 6 - 20 mg/dL _0 Creatinine 0.44 - 1.00 mg/dL 0.71 0.72 0.80  Sodium 135 - 145 mmol/L 140 137 139  Potassium 3.5 - 5.1 mmol/L 4.4 4.4 4.2  Chloride 98 - 111 mmol/L 106 100 101  CO2 22 - 32 mmol/L _1 Calcium 8.9 - 10.3 mg/dL 9.5 9.5 9.6  Total Protein 6.5 - 8.1 g/dL 7.2 7.4 7.2  Total Bilirubin 0.3 - 1.2 mg/dL <0.2(L) 0.2 0.3  Alkaline Phos 38 - 126 U/L 108 128 108  AST 15 - 41 U/L _2 ALT 0 - 44 U/L _3 RADIOGRAPHIC STUDIES: I have personally reviewed the radiological images as listed and agreed with the findings in the report. No results found.   ASSESSMENT &  PLAN: [name] is a very pleasant 42 year old white female with metastatic high-grade neoplasm most consistent with metastatic malignant melanoma presented with large left left upper/left lower lobe mass with a hypermetabolic AP window lymph node as well as solitary metastatic brain lesion diagnosed in May 2019. She underwent left temporal craniotomy with resection of tumor with intraoperative stereotactic guidance for volumetric resection under the care of Dr. Annette Stable on 04/29/2018. Her foundation one testing reveals no actionable mutations. She has tapered off steroids and completed first cycle of pembrolizumab on 06/02/18. She tolerated first cycle moderately well with increased fatigue, mild cough and dyspnea. Her symptoms are improved. She had second opinion at [facility] with Dr. Aniceto Boss who agrees with Dr. Worthy Flank plan.  Labs reviewed, WBC is elevated likely related to  recent steroids but overall labs are adequate for treatment. I recommend to proceed with cycle 2 pembrolizumab today. She will return for follow up with Dr. Julien Nordmann in 3 weeks prior to cycle 3. The plan was reviewed with Dr. Julien Nordmann.   All questions were answered. The patient knows to call the clinic with any problems, questions or concerns. No barriers to learning was detected. I spent 20 minutes counseling the patient face to face. The total time spent in the appointment was 25 minutes and more than 50% was on counseling and review of test results     Alla Feeling, NP 06/23/18

## 2018-06-24 ENCOUNTER — Telehealth: Payer: Self-pay | Admitting: Hematology

## 2018-06-24 NOTE — Telephone Encounter (Signed)
No LOS 7/11

## 2018-07-04 ENCOUNTER — Encounter: Payer: Self-pay | Admitting: Radiation Oncology

## 2018-07-04 ENCOUNTER — Ambulatory Visit
Admission: RE | Admit: 2018-07-04 | Discharge: 2018-07-04 | Disposition: A | Discharge status: Home / Self Care | Payer: BLUE CROSS/BLUE SHIELD | Source: Ambulatory Visit | Attending: Radiation Oncology | Admitting: Radiation Oncology

## 2018-07-04 VITALS — BP 116/74 | HR 88 | Temp 98.7°F | Resp 16 | Ht 69.0 in | Wt 136.6 lb

## 2018-07-04 DIAGNOSIS — C3492 Malignant neoplasm of unspecified part of left bronchus or lung: Secondary | ICD-10-CM | POA: Diagnosis not present

## 2018-07-04 DIAGNOSIS — C3432 Malignant neoplasm of lower lobe, left bronchus or lung: Secondary | ICD-10-CM

## 2018-07-04 DIAGNOSIS — C7931 Secondary malignant neoplasm of brain: Secondary | ICD-10-CM | POA: Diagnosis not present

## 2018-07-04 NOTE — Progress Notes (Signed)
  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  The patient tolerated radiotherapy very well, she is not taking any dexamethasone at this time.  She continues on systemic therapy with pembrolizumab.                       On review of systems, the patient states she's doing great and back to her normal routine including weight lifting. She denies any headaches or visual or speech changes. No other complaints are noted.   ALLERGIES:  has No Known Allergies.  Meds: Current Outpatient Medications  Medication Sig Dispense Refill  . Ascorbic Acid (LIQUID C 500) 500 MG/15ML LIQD Take 1,000 mg by mouth daily.    Marland Kitchen GAMMA AMINOBUTYRIC ACID PO Take by mouth.    Marland Kitchen HCA CALCIUM-MAGNESIUM-ZINC PO Take by mouth. Taking one half tablespoon daily    . Melatonin 5 MG CAPS Take by mouth.    . Multiple Vitamin (MULTIVITAMIN) tablet Take 1 tablet by mouth daily.    . Omega 3-6-9 Fatty Acids (OMEGA 3-6-9 COMPLEX PO) Take 1 capsule by mouth daily.    Marland Kitchen OVER THE COUNTER MEDICATION     . prochlorperazine (COMPAZINE) 10 MG tablet Take 1 tablet (10 mg total) by mouth every 6 (six) hours as needed for nausea or vomiting. (Patient not taking: Reported on 06/23/2018) 30 tablet 0   No current facility-administered medications for this encounter.     Physical Findings:  height is 5' 9"  (1.753 m) and weight is 136 lb 9.6 oz (62 kg). Her oral temperature is 98.7 F (37.1 C). Her blood  pressure is 116/74 and her pulse is 88. Her respiration is 16 and oxygen saturation is 98%.  Pain Assessment Pain Score: 0-No pain/10 In general this is a well appearing caucasian female in no acute distress. She's alert and oriented x4 and appropriate throughout the examination. Cardiopulmonary assessment is negative for acute distress and she exhibits normal effort. Her left lateral craniotomy incision site is well healed.   Lab Findings: Lab Results  Component Value Date   WBC 16.4 (H) 06/23/2018   HGB 11.4 (L) 06/23/2018   HCT 34.6 (L) 06/23/2018   MCV 83.7 06/23/2018   PLT 591 (H) 06/23/2018     Radiographic Findings: No results found.  Impression/Plan: 1. 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  The patient appears to be doing well and recovering from the effects of stereotactic radiosurgery.  She will move forward with routine surveillance MRI scan in approximately 2 months.  She is in agreement with this plan and will call us with questions or concerns that arise prior to that visit.  She will also continue on pembrolizumab under the care of Dr. Julien Nordmann.     Carola Rhine, PAC

## 2018-07-05 ENCOUNTER — Telehealth: Payer: Self-pay | Admitting: Medical Oncology

## 2018-07-05 ENCOUNTER — Telehealth: Payer: Self-pay | Admitting: *Deleted

## 2018-07-05 NOTE — Telephone Encounter (Signed)
Call received from significant other, Leslie [name] asking "How often should a patient on immunotherapy be seen?  Leslie [name] children called me concerned because she vomited at 7:30 am.  It's gone so I do not know what it looked like or how much.  She has not vomited anymore and has not  Yet taken the compazine.  If she gets confused do I need to call back?  What should we look for to know when to bring her in?"  This nurse stated patient needs to take the compazine, drink fluids, bouillon, Gatorade, propel.  Try to eat.  If unable to eat, increased vomiting episodes with use of compazine.   Call information to [facility] care team for instructions or orders as needed.

## 2018-07-05 NOTE — Telephone Encounter (Signed)
This am pt had fever 101.4 and 101.11f. Vomited x 1. she denies abdominal pain , diarrhea or other symptoms and [place]  [place] now. Per MJulien NordmannI [facility]. I instructed MRonalee Beltsto monitor symptoms for now and to call back if symptoms persist or worsen.

## 2018-07-14 ENCOUNTER — Encounter: Payer: Self-pay | Admitting: Internal Medicine

## 2018-07-14 ENCOUNTER — Inpatient Hospital Stay: Payer: BLUE CROSS/BLUE SHIELD | Attending: Internal Medicine

## 2018-07-14 ENCOUNTER — Inpatient Hospital Stay (HOSPITAL_BASED_OUTPATIENT_CLINIC_OR_DEPARTMENT_OTHER): Payer: BLUE CROSS/BLUE SHIELD | Admitting: Internal Medicine

## 2018-07-14 ENCOUNTER — Telehealth: Payer: Self-pay | Admitting: Internal Medicine

## 2018-07-14 ENCOUNTER — Inpatient Hospital Stay: Payer: BLUE CROSS/BLUE SHIELD

## 2018-07-14 VITALS — BP 114/76 | HR 79 | Temp 98.3°F | Resp 20 | Ht 69.0 in | Wt 126.4 lb

## 2018-07-14 DIAGNOSIS — C3432 Malignant neoplasm of lower lobe, left bronchus or lung: Secondary | ICD-10-CM

## 2018-07-14 DIAGNOSIS — C7802 Secondary malignant neoplasm of left lung: Secondary | ICD-10-CM

## 2018-07-14 DIAGNOSIS — Z79899 Other long term (current) drug therapy: Secondary | ICD-10-CM | POA: Diagnosis not present

## 2018-07-14 DIAGNOSIS — K561 Intussusception: Secondary | ICD-10-CM | POA: Insufficient documentation

## 2018-07-14 DIAGNOSIS — C7931 Secondary malignant neoplasm of brain: Secondary | ICD-10-CM

## 2018-07-14 DIAGNOSIS — Z5112 Encounter for antineoplastic immunotherapy: Secondary | ICD-10-CM | POA: Diagnosis not present

## 2018-07-14 DIAGNOSIS — R5382 Chronic fatigue, unspecified: Secondary | ICD-10-CM

## 2018-07-14 DIAGNOSIS — J90 Pleural effusion, not elsewhere classified: Secondary | ICD-10-CM | POA: Diagnosis not present

## 2018-07-14 DIAGNOSIS — I313 Pericardial effusion (noninflammatory): Secondary | ICD-10-CM | POA: Diagnosis not present

## 2018-07-14 LAB — CBC WITH DIFFERENTIAL (CANCER CENTER ONLY)
Basophils Absolute: 0 10*3/uL (ref 0.0–0.1)
Basophils Relative: 0 %
Eosinophils Absolute: 0.2 10*3/uL (ref 0.0–0.5)
Eosinophils Relative: 1 %
HCT: 37.4 % (ref 34.8–46.6)
Hemoglobin: 11.8 g/dL (ref 11.6–15.9)
Lymphocytes Relative: 10 %
Lymphs Abs: 1.6 10*3/uL (ref 0.9–3.3)
MCH: 27.4 pg (ref 25.1–34.0)
MCHC: 31.6 g/dL (ref 31.5–36.0)
MCV: 87 fL (ref 79.5–101.0)
Monocytes Absolute: 0.8 10*3/uL (ref 0.1–0.9)
Monocytes Relative: 6 %
Neutro Abs: 12.6 10*3/uL — ABNORMAL HIGH (ref 1.5–6.5)
Neutrophils Relative %: 83 %
Platelet Count: 453 10*3/uL — ABNORMAL HIGH (ref 145–400)
RBC: 4.3 MIL/uL (ref 3.70–5.45)
RDW: 14.5 % (ref 11.2–14.5)
WBC Count: 15.2 10*3/uL — ABNORMAL HIGH (ref 3.9–10.3)

## 2018-07-14 LAB — CMP (CANCER CENTER ONLY)
ALT: 14 U/L (ref 0–44)
AST: 26 U/L (ref 15–41)
Albumin: 3.6 g/dL (ref 3.5–5.0)
Alkaline Phosphatase: 112 U/L (ref 38–126)
Anion gap: 9 (ref 5–15)
BUN: 9 mg/dL (ref 6–20)
CO2: 27 mmol/L (ref 22–32)
Calcium: 9.7 mg/dL (ref 8.9–10.3)
Chloride: 107 mmol/L (ref 98–111)
Creatinine: 0.73 mg/dL (ref 0.44–1.00)
GFR, Est AFR Am: >60 mL/min (ref >60)
GFR, Estimated: >60 mL/min (ref >60)
Glucose, Bld: 67 mg/dL — ABNORMAL LOW (ref 70–99)
Potassium: 4.1 mmol/L (ref 3.5–5.1)
Sodium: 143 mmol/L (ref 135–145)
Total Bilirubin: 0.2 mg/dL — ABNORMAL LOW (ref 0.3–1.2)
Total Protein: 7.5 g/dL (ref 6.5–8.1)

## 2018-07-14 LAB — TSH: TSH: 2.008 u[IU]/mL (ref 0.308–3.960)

## 2018-07-14 MED ORDER — SODIUM CHLORIDE 0.9 % IV SOLN
200.0000 mg | Freq: Once | INTRAVENOUS | Status: AC
  Administered 2018-07-14: 200 mg via INTRAVENOUS
  Filled 2018-07-14: qty 8

## 2018-07-14 MED ORDER — SODIUM CHLORIDE 0.9 % IV SOLN
Freq: Once | INTRAVENOUS | Status: AC
Start: 2018-07-14 — End: 2018-07-14
  Administered 2018-07-14: 10:00:00 via INTRAVENOUS
  Filled 2018-07-14: qty 250

## 2018-07-14 NOTE — Patient Instructions (Signed)
[facility] Discharge Instructions for Patients Receiving Chemotherapy  Today you received the following chemotherapy agents Keytruda  To help prevent nausea and vomiting after your treatment, we encourage you to take your nausea medication as dircted   If you develop nausea and vomiting that is not controlled by your nausea medication, call the clinic.   BELOW ARE SYMPTOMS THAT SHOULD BE REPORTED IMMEDIATELY:  *FEVER GREATER THAN 100.5 F  *CHILLS WITH OR WITHOUT FEVER  NAUSEA AND VOMITING THAT IS NOT CONTROLLED WITH YOUR NAUSEA MEDICATION  *UNUSUAL SHORTNESS OF BREATH  *UNUSUAL BRUISING OR BLEEDING  TENDERNESS IN MOUTH AND THROAT WITH OR WITHOUT PRESENCE OF ULCERS  *URINARY PROBLEMS  *BOWEL PROBLEMS  UNUSUAL RASH Items with * indicate a potential emergency and should be followed up as soon as possible.  Feel free to call the clinic should you have any questions or concerns. The clinic phone number is [phone].  Please show the [facility] at check-in to the Emergency Department and triage nurse.

## 2018-07-14 NOTE — Progress Notes (Signed)
[facility] [phone]   Fax:([phone]  OFFICE PROGRESS NOTE  System, Pcp Not In No address on file  DIAGNOSIS: Metastatic high-grade neoplasm highly suspicious for metastatic malignant melanoma presented with large left left upper/left lower lobe mass with a hypermetabolic AP window lymph node as well as solitary metastatic brain lesion diagnosed in May 2019.  PRIOR THERAPY:left temporal craniotomy with resection of tumor with intraoperative stereotactic guidance for volumetric resection under the care of Dr. Annette Stable on 04/29/2018.  CURRENT THERAPY: First-line treatment with immunotherapy with Keytruda 200 mg IV every 3 weeks.  First dose June 02, 2018.  Status post 2 cycles.  INTERVAL HISTORY: Leslie [name] 42 y.o. female returns to the clinic today for follow-up visit accompanied by her boyfriend.  The patient is feeling fine today with no specific complaints.  She had 2 days of viral gastroenteritis and she recovered quickly.  She denied having any skin rash but has occasional itching.  She has no diarrhea.  She has no significant weight loss or night sweats.  She has no fever or chills.  She denied having any nausea or vomiting.  She continues to tolerate her treatment with Keytruda fairly well.  She is here for evaluation before starting cycle #3.  MEDICAL HISTORY: Past Medical History:  Diagnosis Date  . Migraines   . Yeast infection     ALLERGIES:  has No Known Allergies.  MEDICATIONS:  Current Outpatient Medications  Medication Sig Dispense Refill  . Ascorbic Acid (LIQUID C 500) 500 MG/15ML LIQD Take 1,000 mg by mouth daily.    . Cholecalciferol 1000 UNIT/10ML LIQD Take by mouth.    . Doxylamine Succinate, Sleep, (UNISOM PO) Take by mouth.    Marland Kitchen GAMMA AMINOBUTYRIC ACID PO Take by mouth.    Marland Kitchen HCA CALCIUM-MAGNESIUM-ZINC PO Take by mouth. Taking one half tablespoon daily    . Melatonin 5 MG CAPS Take by mouth.    . Multiple Vitamin  (MULTIVITAMIN) tablet Take 1 tablet by mouth daily.    . Omega 3-6-9 Fatty Acids (OMEGA 3-6-9 COMPLEX PO) Take 1 capsule by mouth daily.    Marland Kitchen OVER THE COUNTER MEDICATION     . prochlorperazine (COMPAZINE) 10 MG tablet Take 1 tablet (10 mg total) by mouth every 6 (six) hours as needed for nausea or vomiting. 30 tablet 0   No current facility-administered medications for this visit.     SURGICAL HISTORY:  Past Surgical History:  Procedure Laterality Date  . APPLICATION OF CRANIAL NAVIGATION Left 04/29/2018   Procedure: APPLICATION OF CRANIAL NAVIGATION;  Surgeon: Earnie Larsson, MD;  Location: [facility];  Service: Neurosurgery;  Laterality: Left;  . CRANIOTOMY Left 04/29/2018   Procedure: LEFT CRANIOTOMY FOR  TUMOR BRAIN LAB;  Surgeon: Earnie Larsson, MD;  Location: [facility];  Service: Neurosurgery;  Laterality: Left;    REVIEW OF SYSTEMS:  A comprehensive review of systems was negative.   PHYSICAL EXAMINATION: General appearance: alert, cooperative and no distress Head: Normocephalic, without obvious abnormality, atraumatic Neck: no adenopathy, no JVD, supple, symmetrical, trachea midline and thyroid not enlarged, symmetric, no tenderness/mass/nodules Lymph nodes: Cervical, supraclavicular, and axillary nodes normal. Resp: clear to auscultation bilaterally Back: symmetric, no curvature. ROM normal. No CVA tenderness. Cardio: regular rate and rhythm, S1, S2 normal, no murmur, click, rub or gallop GI: soft, non-tender; bowel sounds normal; no masses,  no organomegaly Extremities: extremities normal, atraumatic, no cyanosis or edema  ECOG PERFORMANCE STATUS: 1 - Symptomatic but completely  ambulatory  Blood pressure 114/76, pulse 79, temperature 98.3 F (36.8 C), temperature source Oral, resp. rate 20, height 5' 9"  (1.753 m), weight 126 lb 6.4 oz (57.3 kg), SpO2 100 %, unknown if currently breastfeeding.  LABORATORY DATA: Lab Results  Component Value Date   WBC 15.2 (H) 07/14/2018   HGB 11.8  07/14/2018   HCT 37.4 07/14/2018   MCV 87.0 07/14/2018   PLT 453 (H) 07/14/2018      Chemistry      Component Value Date/Time   NA 140 06/23/2018 0834   K 4.4 06/23/2018 0834   CL 106 06/23/2018 0834   CO2 25 06/23/2018 0834   BUN 8 06/23/2018 0834   CREATININE 0.71 06/23/2018 0834      Component Value Date/Time   CALCIUM 9.5 06/23/2018 0834   ALKPHOS 108 06/23/2018 0834   AST 15 06/23/2018 0834   ALT 10 06/23/2018 0834   BILITOT <0.2 (L) 06/23/2018 0834       RADIOGRAPHIC STUDIES: No results found.  ASSESSMENT AND PLAN: This is a very pleasant 42 years old white female with highly suspicious metastatic malignant melanoma presented with large mass in the left upper/left lower lobe and mediastinal lymphadenopathy as well as solitary brain metastasis status post left temporal craniotomy and resection of tumor on 04/29/2018 and she is recovering well from her surgery. The patient completed stereotactic radiotherapy to the resection cavity next week under the care of Dr. Lisbeth Renshaw. The patient is currently undergoing treatment with immunotherapy with Keytruda status post 2 cycles.  She has been tolerating this treatment well with no concerning complaints. I recommended for her to proceed with cycle #3 today as scheduled. I will see the patient back for follow-up visit in 3 weeks for evaluation after repeating CT scan of the chest, abdomen and pelvis for restaging of her disease. She was advised to call immediately if she has any concerning symptoms in the interval. The patient voices understanding of current disease status and treatment options and is in agreement with the current care plan.  All questions were answered. The patient knows to call the clinic with any problems, questions or concerns. We can certainly see the patient much sooner if necessary.  I spent 10 minutes counseling the patient face to face. The total time spent in the appointment was 15 minutes.  Disclaimer: This  note was dictated with voice recognition software. Similar sounding words can inadvertently be transcribed and may not be corrected upon review.

## 2018-07-14 NOTE — Telephone Encounter (Signed)
Scheduled appt per 8/1 los - pt to get an updated schedule  In treatment area.

## 2018-07-27 ENCOUNTER — Other Ambulatory Visit: Payer: Self-pay | Admitting: Radiation Therapy

## 2018-07-27 ENCOUNTER — Telehealth: Payer: Self-pay | Admitting: *Deleted

## 2018-07-27 DIAGNOSIS — C7931 Secondary malignant neoplasm of brain: Secondary | ICD-10-CM

## 2018-07-27 DIAGNOSIS — C7949 Secondary malignant neoplasm of other parts of nervous system: Principal | ICD-10-CM

## 2018-07-27 NOTE — Telephone Encounter (Signed)
Call from [facility] in [place] regarding pt upcoming CT scan. No scan appt at this time. Call placed to [facility] scheduling appt made for 8/19 830. Notified pt w/instructions. No further concerns.

## 2018-08-01 ENCOUNTER — Encounter (HOSPITAL_COMMUNITY): Payer: Self-pay | Admitting: Radiology

## 2018-08-01 ENCOUNTER — Ambulatory Visit (HOSPITAL_COMMUNITY)
Admission: RE | Admit: 2018-08-01 | Discharge: 2018-08-01 | Disposition: A | Discharge status: Home / Self Care | Payer: BLUE CROSS/BLUE SHIELD | Source: Ambulatory Visit | Attending: Internal Medicine | Admitting: Internal Medicine

## 2018-08-01 DIAGNOSIS — C3432 Malignant neoplasm of lower lobe, left bronchus or lung: Secondary | ICD-10-CM | POA: Diagnosis not present

## 2018-08-01 DIAGNOSIS — I313 Pericardial effusion (noninflammatory): Secondary | ICD-10-CM | POA: Diagnosis not present

## 2018-08-01 DIAGNOSIS — J90 Pleural effusion, not elsewhere classified: Secondary | ICD-10-CM | POA: Diagnosis not present

## 2018-08-01 DIAGNOSIS — C439 Malignant melanoma of skin, unspecified: Secondary | ICD-10-CM | POA: Diagnosis not present

## 2018-08-01 MED ORDER — IOHEXOL 300 MG/ML  SOLN
100.0000 mL | Freq: Once | INTRAMUSCULAR | Status: AC | PRN
  Administered 2018-08-01: 100 mL via INTRAVENOUS

## 2018-08-04 ENCOUNTER — Telehealth: Payer: Self-pay | Admitting: Internal Medicine

## 2018-08-04 ENCOUNTER — Inpatient Hospital Stay (HOSPITAL_BASED_OUTPATIENT_CLINIC_OR_DEPARTMENT_OTHER): Payer: BLUE CROSS/BLUE SHIELD | Admitting: Internal Medicine

## 2018-08-04 ENCOUNTER — Encounter: Payer: Self-pay | Admitting: Internal Medicine

## 2018-08-04 ENCOUNTER — Inpatient Hospital Stay: Payer: BLUE CROSS/BLUE SHIELD

## 2018-08-04 VITALS — BP 117/81 | HR 83 | Temp 98.6°F | Resp 17 | Ht 69.0 in

## 2018-08-04 DIAGNOSIS — C3432 Malignant neoplasm of lower lobe, left bronchus or lung: Secondary | ICD-10-CM | POA: Diagnosis not present

## 2018-08-04 DIAGNOSIS — Z5112 Encounter for antineoplastic immunotherapy: Secondary | ICD-10-CM | POA: Diagnosis not present

## 2018-08-04 DIAGNOSIS — I313 Pericardial effusion (noninflammatory): Secondary | ICD-10-CM

## 2018-08-04 DIAGNOSIS — C7802 Secondary malignant neoplasm of left lung: Secondary | ICD-10-CM

## 2018-08-04 DIAGNOSIS — J90 Pleural effusion, not elsewhere classified: Secondary | ICD-10-CM

## 2018-08-04 DIAGNOSIS — R5382 Chronic fatigue, unspecified: Secondary | ICD-10-CM

## 2018-08-04 DIAGNOSIS — Z79899 Other long term (current) drug therapy: Secondary | ICD-10-CM

## 2018-08-04 DIAGNOSIS — K561 Intussusception: Secondary | ICD-10-CM

## 2018-08-04 DIAGNOSIS — C7931 Secondary malignant neoplasm of brain: Secondary | ICD-10-CM

## 2018-08-04 DIAGNOSIS — Z7189 Other specified counseling: Secondary | ICD-10-CM

## 2018-08-04 LAB — CBC WITH DIFFERENTIAL (CANCER CENTER ONLY)
Basophils Absolute: 0.1 10*3/uL (ref 0.0–0.1)
Basophils Relative: 1 %
Eosinophils Absolute: 0.1 10*3/uL (ref 0.0–0.5)
Eosinophils Relative: 0 %
HCT: 36.6 % (ref 34.8–46.6)
Hemoglobin: 12 g/dL (ref 11.6–15.9)
Lymphocytes Relative: 8 %
Lymphs Abs: 1.1 10*3/uL (ref 0.9–3.3)
MCH: 27.4 pg (ref 25.1–34.0)
MCHC: 32.7 g/dL (ref 31.5–36.0)
MCV: 83.9 fL (ref 79.5–101.0)
Monocytes Absolute: 0.8 10*3/uL (ref 0.1–0.9)
Monocytes Relative: 6 %
Neutro Abs: 11.1 10*3/uL — ABNORMAL HIGH (ref 1.5–6.5)
Neutrophils Relative %: 85 %
Platelet Count: 408 10*3/uL — ABNORMAL HIGH (ref 145–400)
RBC: 4.36 MIL/uL (ref 3.70–5.45)
RDW: 16.1 % — ABNORMAL HIGH (ref 11.2–14.5)
WBC Count: 13.2 10*3/uL — ABNORMAL HIGH (ref 3.9–10.3)

## 2018-08-04 LAB — CMP (CANCER CENTER ONLY)
ALT: 15 U/L (ref 0–44)
AST: 22 U/L (ref 15–41)
Albumin: 3.7 g/dL (ref 3.5–5.0)
Alkaline Phosphatase: 115 U/L (ref 38–126)
Anion gap: 8 (ref 5–15)
BUN: 13 mg/dL (ref 6–20)
CO2: 25 mmol/L (ref 22–32)
Calcium: 9.6 mg/dL (ref 8.9–10.3)
Chloride: 108 mmol/L (ref 98–111)
Creatinine: 0.73 mg/dL (ref 0.44–1.00)
GFR, Est AFR Am: >60 mL/min (ref >60)
GFR, Estimated: >60 mL/min (ref >60)
Glucose, Bld: 75 mg/dL (ref 70–99)
Potassium: 4.2 mmol/L (ref 3.5–5.1)
Sodium: 141 mmol/L (ref 135–145)
Total Bilirubin: <0.2 mg/dL — ABNORMAL LOW (ref 0.3–1.2)
Total Protein: 7.3 g/dL (ref 6.5–8.1)

## 2018-08-04 LAB — TSH: TSH: 3.234 u[IU]/mL (ref 0.308–3.960)

## 2018-08-04 NOTE — Progress Notes (Signed)
[place] [phone]) [phone]   [phone]) [phone]  OFFICE PROGRESS NOTE  System, Pcp Not In No address on file  DIAGNOSIS: Metastatic high-grade neoplasm highly suspicious for metastatic malignant melanoma presented with large left left upper/left lower lobe mass with a hypermetabolic AP window lymph node as well as solitary metastatic brain lesion diagnosed in May 2019.  Biomarker Findings Microsatellite status - MS-Stable Tumor Mutational Burden - TMB-Intermediate (16 Muts/Mb) Genomic Findings For a complete list of the genes assayed, please refer to the Appendix. CD274 (PD-L1) amplification NRAS Q61R PDCD1LG2 (PD-L2) amplification MYC amplification EPHB1 amplification JAK2 amplification RB1 Q93*, J935* TERT promoter -146C>T TP53 C275W  PRIOR THERAPY: 1) left temporal craniotomy with resection of tumor with intraoperative stereotactic guidance for volumetric resection under the care of Dr. Annette Stable on 04/29/2018. 2) First-line treatment with immunotherapy with Keytruda 200 mg IV every 3 weeks.  First dose June 02, 2018.  Status post 3 cycles.  This was discontinued secondary to disease progression.  CURRENT THERAPY: None.  INTERVAL HISTORY: Leslie [name] 42 y.o. female clinic today for follow-up visit and accompanied by her boyfriend.  The patient is feeling fine today with no concerning complaints.  She tolerated the previous course of treatment with Keytruda fairly well with no significant adverse effects.  She denied having any skin rash or diarrhea.  She has no nausea, vomiting, diarrhea or constipation.  She denied having any weight loss or night sweats.  She has no fever or chills.  The patient denied having any headache or visual changes.  She had repeat CT scan of the chest, abdomen and pelvis performed recently and she is here for evaluation and discussion of her risk her results.  MEDICAL HISTORY: Past Medical History:  Diagnosis Date  .  Migraines   . Yeast infection     ALLERGIES:  has No Known Allergies.  MEDICATIONS:  Current Outpatient Medications  Medication Sig Dispense Refill  . Ascorbic Acid (LIQUID C 500) 500 MG/15ML LIQD Take 1,000 mg by mouth daily.    . Cholecalciferol 1000 UNIT/10ML LIQD Take by mouth.    . Doxylamine Succinate, Sleep, (UNISOM PO) Take by mouth.    Marland Kitchen GAMMA AMINOBUTYRIC ACID PO Take by mouth.    Marland Kitchen HCA CALCIUM-MAGNESIUM-ZINC PO Take by mouth. Taking one half tablespoon daily    . Melatonin 5 MG CAPS Take by mouth.    . Multiple Vitamin (MULTIVITAMIN) tablet Take 1 tablet by mouth daily.    . Omega 3-6-9 Fatty Acids (OMEGA 3-6-9 COMPLEX PO) Take 1 capsule by mouth daily.    Marland Kitchen OVER THE COUNTER MEDICATION     . prochlorperazine (COMPAZINE) 10 MG tablet Take 1 tablet (10 mg total) by mouth every 6 (six) hours as needed for nausea or vomiting. 30 tablet 0   No current facility-administered medications for this visit.     SURGICAL HISTORY:  Past Surgical History:  Procedure Laterality Date  . APPLICATION OF CRANIAL NAVIGATION Left 04/29/2018   Procedure: APPLICATION OF CRANIAL NAVIGATION;  Surgeon: Earnie Larsson, MD;  Location: [facility];  Service: Neurosurgery;  Laterality: Left;  . CRANIOTOMY Left 04/29/2018   Procedure: LEFT CRANIOTOMY FOR  TUMOR BRAIN LAB;  Surgeon: Earnie Larsson, MD;  Location: [facility];  Service: Neurosurgery;  Laterality: Left;    REVIEW OF SYSTEMS:  Constitutional: negative Eyes: negative Ears, nose, mouth, throat, and face: negative Respiratory: negative Cardiovascular: negative Gastrointestinal: negative Genitourinary:negative Integument/breast: negative Hematologic/lymphatic: negative Musculoskeletal:negative Neurological: negative Behavioral/Psych: negative Endocrine:  negative Allergic/Immunologic: negative   PHYSICAL EXAMINATION: General appearance: alert, cooperative and no distress Head: Normocephalic, without obvious abnormality, atraumatic Neck: no  adenopathy, no JVD, supple, symmetrical, trachea midline and thyroid not enlarged, symmetric, no tenderness/mass/nodules Lymph nodes: Cervical, supraclavicular, and axillary nodes normal. Resp: clear to auscultation bilaterally Back: symmetric, no curvature. ROM normal. No CVA tenderness. Cardio: regular rate and rhythm, S1, S2 normal, no murmur, click, rub or gallop GI: soft, non-tender; bowel sounds normal; no masses,  no organomegaly Extremities: extremities normal, atraumatic, no cyanosis or edema Neurologic: Alert and oriented X 3, normal strength and tone. Normal symmetric reflexes. Normal coordination and gait  ECOG PERFORMANCE STATUS: 0 - Asymptomatic  Blood pressure 117/81, pulse 83, temperature 98.6 F (37 C), temperature source Oral, resp. rate 17, height 5' 9"  (1.753 m), SpO2 100 %, unknown if currently breastfeeding.  LABORATORY DATA: Lab Results  Component Value Date   WBC 13.2 (H) 08/04/2018   HGB 12.0 08/04/2018   HCT 36.6 08/04/2018   MCV 83.9 08/04/2018   PLT 408 (H) 08/04/2018      Chemistry      Component Value Date/Time   NA 143 07/14/2018 0921   K 4.1 07/14/2018 0921   CL 107 07/14/2018 0921   CO2 27 07/14/2018 0921   BUN 9 07/14/2018 0921   CREATININE 0.73 07/14/2018 0921      Component Value Date/Time   CALCIUM 9.7 07/14/2018 0921   ALKPHOS 112 07/14/2018 0921   AST 26 07/14/2018 0921   ALT 14 07/14/2018 0921   BILITOT 0.2 (L) 07/14/2018 0921       RADIOGRAPHIC STUDIES: Ct Chest W Contrast  Result Date: 08/01/2018 CLINICAL DATA:  Restaging metastatic melanoma. EXAM: CT CHEST, ABDOMEN, AND PELVIS WITH CONTRAST TECHNIQUE: Multidetector CT imaging of the chest, abdomen and pelvis was performed following the standard protocol during bolus administration of intravenous contrast. CONTRAST:  142m OMNIPAQUE IOHEXOL 300 MG/ML  SOLN COMPARISON:  PET-CT 05/13/2018 FINDINGS: CT CHEST FINDINGS Cardiovascular: The heart size appears normal. Tiny pericardial  effusion is new from previous exam, image 46/2. Mediastinum/Nodes: Pre-vascular lymph node adjacent to the transverse aorta measures 0.9 cm, image 20/2. Stable. No supraclavicular, axillary or hilar adenopathy. Lungs/Pleura: There is a small left pleural effusion. Slightly increased in volume from previous exam. The lingular lung mass which appears inseparable from the pericardium is again noted. On today's exam this measures 8.0 by 5.6 cm, image 42/2. On 04/26/2018 this measured 5.6 x 3.9 cm. Musculoskeletal: No chest wall mass or suspicious bone lesions identified. CT ABDOMEN PELVIS FINDINGS Hepatobiliary: No focal liver abnormality is seen. No gallstones, gallbladder wall thickening, or biliary dilatation. Pancreas: Unremarkable. No pancreatic ductal dilatation or surrounding inflammatory changes. Spleen: Normal in size without focal abnormality. Adrenals/Urinary Tract: Adrenal glands are unremarkable. Kidneys are normal, without renal calculi, focal lesion, or hydronephrosis. Bladder is unremarkable. Stomach/Bowel: The stomach appears nondistended. Proximal enteroenteric intussusception is identified without evidence for obstructiona, image 36/4. The remaining small bowel loops are unremarkable. No pathologic dilatation of the colon. Vascular/Lymphatic: Normal appearance of the abdominal aorta. No enlarged retroperitoneal or mesenteric adenopathy. No enlarged pelvic or inguinal lymph nodes. Reproductive: The uterus and adnexal structures appear unremarkable. No discrete mass identified. Cyst in the previously noted hypermetabolic left ovary has the appearance of a corpus luteum. Other: Small amount of free fluid noted within the pelvis. Musculoskeletal: Small scattered sclerotic foci within the bony pelvis are again noted. Probable bone islands. No aggressive lytic or sclerotic bone lesions. IMPRESSION: 1. There  has been interval increase in size lingular lung mass. This appears inseparable from the pericardium  and there is a new small pericardial effusion. 2. Small left pleural effusion, increased from previous exam 3. Incidental note of entero enteric intussusception within the right hemiabdomen. This is a nonspecific finding and is often an incidental finding. Intussusception, in some cases, may be the result of a lead point endoluminal lesion. Electronically Signed   By: Kerby Moors M.D.   On: 08/01/2018 09:20   Ct Abdomen Pelvis W Contrast  Result Date: 08/01/2018 CLINICAL DATA:  Restaging metastatic melanoma. EXAM: CT CHEST, ABDOMEN, AND PELVIS WITH CONTRAST TECHNIQUE: Multidetector CT imaging of the chest, abdomen and pelvis was performed following the standard protocol during bolus administration of intravenous contrast. CONTRAST:  140m OMNIPAQUE IOHEXOL 300 MG/ML  SOLN COMPARISON:  PET-CT 05/13/2018 FINDINGS: CT CHEST FINDINGS Cardiovascular: The heart size appears normal. Tiny pericardial effusion is new from previous exam, image 46/2. Mediastinum/Nodes: Pre-vascular lymph node adjacent to the transverse aorta measures 0.9 cm, image 20/2. Stable. No supraclavicular, axillary or hilar adenopathy. Lungs/Pleura: There is a small left pleural effusion. Slightly increased in volume from previous exam. The lingular lung mass which appears inseparable from the pericardium is again noted. On today's exam this measures 8.0 by 5.6 cm, image 42/2. On 04/26/2018 this measured 5.6 x 3.9 cm. Musculoskeletal: No chest wall mass or suspicious bone lesions identified. CT ABDOMEN PELVIS FINDINGS Hepatobiliary: No focal liver abnormality is seen. No gallstones, gallbladder wall thickening, or biliary dilatation. Pancreas: Unremarkable. No pancreatic ductal dilatation or surrounding inflammatory changes. Spleen: Normal in size without focal abnormality. Adrenals/Urinary Tract: Adrenal glands are unremarkable. Kidneys are normal, without renal calculi, focal lesion, or hydronephrosis. Bladder is unremarkable. Stomach/Bowel:  The stomach appears nondistended. Proximal enteroenteric intussusception is identified without evidence for obstructiona, image 36/4. The remaining small bowel loops are unremarkable. No pathologic dilatation of the colon. Vascular/Lymphatic: Normal appearance of the abdominal aorta. No enlarged retroperitoneal or mesenteric adenopathy. No enlarged pelvic or inguinal lymph nodes. Reproductive: The uterus and adnexal structures appear unremarkable. No discrete mass identified. Cyst in the previously noted hypermetabolic left ovary has the appearance of a corpus luteum. Other: Small amount of free fluid noted within the pelvis. Musculoskeletal: Small scattered sclerotic foci within the bony pelvis are again noted. Probable bone islands. No aggressive lytic or sclerotic bone lesions. IMPRESSION: 1. There has been interval increase in size lingular lung mass. This appears inseparable from the pericardium and there is a new small pericardial effusion. 2. Small left pleural effusion, increased from previous exam 3. Incidental note of entero enteric intussusception within the right hemiabdomen. This is a nonspecific finding and is often an incidental finding. Intussusception, in some cases, may be the result of a lead point endoluminal lesion. Electronically Signed   By: TKerby MoorsM.D.   On: 08/01/2018 09:20    ASSESSMENT AND PLAN: This is a very pleasant 42years old white female with highly suspicious metastatic malignant melanoma presented with large mass in the left upper/left lower lobe and mediastinal lymphadenopathy as well as solitary brain metastasis status post left temporal craniotomy and resection of tumor on 04/29/2018 and she is recovering well from her surgery. The patient completed stereotactic radiotherapy to the resection cavity next week under the care of Dr. MLisbeth Renshaw The patient underwent treatment with immunotherapy with Keytruda 200 mg IV every 3 weeks status post 3 cycles.  She has been  tolerating this treatment well with no concerning complaints. The patient  had a repeat CT scan of the chest, abdomen and pelvis performed recently.  I personally and independently reviewed the scan images and discussed the result and showed the images to the patient today.  Unfortunately her scan showed evidence for disease progression with increase in the size of the lingular lung mass which now inseparable from the pericardium and there was new small pericardial effusion. I had a lengthy discussion with the patient and her boyfriend today about her condition and treatment options.  I recommended for the patient to discontinue her current treatment with Keytruda at this point.  I discussed with the patient other treatment options including palliative radiation to this lesion to prevent further invasion into the pericardium.  I also discussed with the patient sending her for a second opinion at [facility] in [place] for evaluation and review of her case and recommendation regarding treatment of her condition.  The patient is interested in these 2 options.  I will see her back for follow-up visit in 1 months for reevaluation and discussion of her treatment options based on her palliative radiotherapy and evaluation at [facility]. The patient was advised to call immediately if she has any concerning symptoms in the interval. The patient voices understanding of current disease status and treatment options and is in agreement with the current care plan.  All questions were answered. The patient knows to call the clinic with any problems, questions or concerns. We can certainly see the patient much sooner if necessary.  I spent 15 minutes counseling the patient face to face. The total time spent in the appointment was 25 minutes.  Disclaimer: This note was dictated with voice recognition software. Similar sounding words can inadvertently be transcribed and may not be corrected  upon review.

## 2018-08-04 NOTE — Telephone Encounter (Signed)
Appts scheduled/ IB message sent to Leslie [name] in regards to sending records to [facility].  All other f/u appts/ Chemo have been cancelled per 8/22 los

## 2018-08-05 ENCOUNTER — Telehealth: Payer: Self-pay | Admitting: Radiation Oncology

## 2018-08-05 NOTE — Telephone Encounter (Signed)
Yesterday evening I called the patient to follow up with her given the communication from medical oncology. This is a young woman who has a history of metastatic carcinoma. She was found to have brain disease and underwent resection of her tumor followed by postop SRS. She has a large lung tumor as well but after much debate and outside review, it was felt that her tumor was most consistent with melanoma and she began systemic immunotherapy. Despite 3 cycles, her restaging scan on 08/01/18 revealed progression in the lingula and in May the tumor was 5.6 x 3.9 cm. Her tumor was remeasured at 8 x 5.6 cm. She is going to go to [facility] for a second opinion very soon, but is offered palliative radiotherapy. We discussed the rationale to consider this to the lung to prevent further progression into the pericardium. She is interested in proceeding.We discussed the risks, benefits, short, and long term effects of radiotherapy,and Dr. Lisbeth Renshaw is recommending a course of 10 fractions. She is willing to come tomorrow for simulation.      Carola Rhine, PAC

## 2018-08-08 ENCOUNTER — Encounter: Payer: Self-pay | Admitting: Medical Oncology

## 2018-08-08 ENCOUNTER — Ambulatory Visit
Admission: RE | Admit: 2018-08-08 | Discharge: 2018-08-08 | Disposition: A | Discharge status: Home / Self Care | Payer: BLUE CROSS/BLUE SHIELD | Source: Ambulatory Visit | Attending: Radiation Oncology | Admitting: Radiation Oncology

## 2018-08-08 ENCOUNTER — Telehealth: Payer: Self-pay | Admitting: Medical Oncology

## 2018-08-08 DIAGNOSIS — C7802 Secondary malignant neoplasm of left lung: Secondary | ICD-10-CM

## 2018-08-08 DIAGNOSIS — M62838 Other muscle spasm: Secondary | ICD-10-CM | POA: Diagnosis not present

## 2018-08-08 DIAGNOSIS — Z510 Encounter for antineoplastic radiation therapy: Secondary | ICD-10-CM | POA: Diagnosis not present

## 2018-08-08 DIAGNOSIS — C7931 Secondary malignant neoplasm of brain: Secondary | ICD-10-CM | POA: Diagnosis not present

## 2018-08-08 DIAGNOSIS — M2560 Stiffness of unspecified joint, not elsewhere classified: Secondary | ICD-10-CM | POA: Diagnosis not present

## 2018-08-08 DIAGNOSIS — M9901 Segmental and somatic dysfunction of cervical region: Secondary | ICD-10-CM | POA: Diagnosis not present

## 2018-08-08 DIAGNOSIS — M9907 Segmental and somatic dysfunction of upper extremity: Secondary | ICD-10-CM | POA: Diagnosis not present

## 2018-08-08 NOTE — Progress Notes (Signed)
FMLA successfully faxed to employer HR Department at [phone]. Mailed copy to patient address on file.

## 2018-08-08 NOTE — Telephone Encounter (Signed)
Name of oncologist , Norberto Sorenson,  and phone numbers left on pt voice mail.

## 2018-08-08 NOTE — Telephone Encounter (Signed)
LM for pt to call us if she has heard back from [facility] with appt. Records were faxed.

## 2018-08-09 ENCOUNTER — Ambulatory Visit: Payer: BLUE CROSS/BLUE SHIELD

## 2018-08-09 ENCOUNTER — Ambulatory Visit: Payer: BLUE CROSS/BLUE SHIELD | Admitting: Radiation Oncology

## 2018-08-09 DIAGNOSIS — D2262 Melanocytic nevi of left upper limb, including shoulder: Secondary | ICD-10-CM | POA: Diagnosis not present

## 2018-08-09 DIAGNOSIS — D2271 Melanocytic nevi of right lower limb, including hip: Secondary | ICD-10-CM | POA: Diagnosis not present

## 2018-08-09 DIAGNOSIS — D485 Neoplasm of uncertain behavior of skin: Secondary | ICD-10-CM | POA: Diagnosis not present

## 2018-08-09 DIAGNOSIS — L723 Sebaceous cyst: Secondary | ICD-10-CM | POA: Diagnosis not present

## 2018-08-09 DIAGNOSIS — Z86018 Personal history of other benign neoplasm: Secondary | ICD-10-CM | POA: Diagnosis not present

## 2018-08-09 DIAGNOSIS — Z510 Encounter for antineoplastic radiation therapy: Secondary | ICD-10-CM | POA: Diagnosis not present

## 2018-08-09 DIAGNOSIS — D2261 Melanocytic nevi of right upper limb, including shoulder: Secondary | ICD-10-CM | POA: Diagnosis not present

## 2018-08-09 DIAGNOSIS — D224 Melanocytic nevi of scalp and neck: Secondary | ICD-10-CM | POA: Diagnosis not present

## 2018-08-09 DIAGNOSIS — C7802 Secondary malignant neoplasm of left lung: Secondary | ICD-10-CM | POA: Diagnosis not present

## 2018-08-09 DIAGNOSIS — C7931 Secondary malignant neoplasm of brain: Secondary | ICD-10-CM | POA: Diagnosis not present

## 2018-08-10 ENCOUNTER — Ambulatory Visit
Admission: RE | Admit: 2018-08-10 | Discharge: 2018-08-10 | Disposition: A | Discharge status: Home / Self Care | Payer: BLUE CROSS/BLUE SHIELD | Source: Ambulatory Visit | Attending: Radiation Oncology | Admitting: Radiation Oncology

## 2018-08-10 ENCOUNTER — Telehealth: Payer: Self-pay | Admitting: Internal Medicine

## 2018-08-10 DIAGNOSIS — C7931 Secondary malignant neoplasm of brain: Secondary | ICD-10-CM | POA: Diagnosis not present

## 2018-08-10 DIAGNOSIS — Z510 Encounter for antineoplastic radiation therapy: Secondary | ICD-10-CM | POA: Diagnosis not present

## 2018-08-10 DIAGNOSIS — C7802 Secondary malignant neoplasm of left lung: Secondary | ICD-10-CM | POA: Diagnosis not present

## 2018-08-10 NOTE — Telephone Encounter (Signed)
Faxed medical records to [facility], Release ID: [id number]

## 2018-08-11 ENCOUNTER — Ambulatory Visit
Admission: RE | Admit: 2018-08-11 | Discharge: 2018-08-11 | Disposition: A | Discharge status: Home / Self Care | Payer: BLUE CROSS/BLUE SHIELD | Source: Ambulatory Visit | Attending: Radiation Oncology | Admitting: Radiation Oncology

## 2018-08-11 ENCOUNTER — Ambulatory Visit: Payer: BLUE CROSS/BLUE SHIELD | Admitting: Radiation Oncology

## 2018-08-11 ENCOUNTER — Other Ambulatory Visit: Payer: Self-pay | Admitting: *Deleted

## 2018-08-11 DIAGNOSIS — C7931 Secondary malignant neoplasm of brain: Secondary | ICD-10-CM | POA: Diagnosis not present

## 2018-08-11 DIAGNOSIS — C7802 Secondary malignant neoplasm of left lung: Secondary | ICD-10-CM

## 2018-08-11 DIAGNOSIS — Z510 Encounter for antineoplastic radiation therapy: Secondary | ICD-10-CM | POA: Diagnosis not present

## 2018-08-12 ENCOUNTER — Ambulatory Visit
Admission: RE | Admit: 2018-08-12 | Discharge: 2018-08-12 | Disposition: A | Discharge status: Home / Self Care | Payer: BLUE CROSS/BLUE SHIELD | Source: Ambulatory Visit | Attending: Radiation Oncology | Admitting: Radiation Oncology

## 2018-08-12 ENCOUNTER — Ambulatory Visit: Payer: BLUE CROSS/BLUE SHIELD | Admitting: Radiation Oncology

## 2018-08-12 DIAGNOSIS — C7931 Secondary malignant neoplasm of brain: Secondary | ICD-10-CM | POA: Diagnosis not present

## 2018-08-12 DIAGNOSIS — C7802 Secondary malignant neoplasm of left lung: Secondary | ICD-10-CM | POA: Diagnosis not present

## 2018-08-12 DIAGNOSIS — Z510 Encounter for antineoplastic radiation therapy: Secondary | ICD-10-CM | POA: Diagnosis not present

## 2018-08-16 ENCOUNTER — Ambulatory Visit: Payer: BLUE CROSS/BLUE SHIELD | Admitting: Radiation Oncology

## 2018-08-16 ENCOUNTER — Ambulatory Visit
Admission: RE | Admit: 2018-08-16 | Discharge: 2018-08-16 | Disposition: A | Discharge status: Home / Self Care | Payer: BLUE CROSS/BLUE SHIELD | Source: Ambulatory Visit | Attending: Radiation Oncology | Admitting: Radiation Oncology

## 2018-08-16 DIAGNOSIS — C7802 Secondary malignant neoplasm of left lung: Secondary | ICD-10-CM | POA: Diagnosis not present

## 2018-08-16 DIAGNOSIS — C7931 Secondary malignant neoplasm of brain: Secondary | ICD-10-CM | POA: Insufficient documentation

## 2018-08-16 DIAGNOSIS — C439 Malignant melanoma of skin, unspecified: Secondary | ICD-10-CM | POA: Insufficient documentation

## 2018-08-16 DIAGNOSIS — Z510 Encounter for antineoplastic radiation therapy: Secondary | ICD-10-CM | POA: Insufficient documentation

## 2018-08-17 ENCOUNTER — Ambulatory Visit: Payer: BLUE CROSS/BLUE SHIELD | Admitting: Radiation Oncology

## 2018-08-17 ENCOUNTER — Ambulatory Visit
Admission: RE | Admit: 2018-08-17 | Discharge: 2018-08-17 | Disposition: A | Discharge status: Home / Self Care | Payer: BLUE CROSS/BLUE SHIELD | Source: Ambulatory Visit | Attending: Radiation Oncology | Admitting: Radiation Oncology

## 2018-08-17 DIAGNOSIS — C7802 Secondary malignant neoplasm of left lung: Secondary | ICD-10-CM | POA: Diagnosis not present

## 2018-08-17 DIAGNOSIS — Z510 Encounter for antineoplastic radiation therapy: Secondary | ICD-10-CM | POA: Diagnosis not present

## 2018-08-17 DIAGNOSIS — C7931 Secondary malignant neoplasm of brain: Secondary | ICD-10-CM | POA: Diagnosis not present

## 2018-08-17 DIAGNOSIS — C439 Malignant melanoma of skin, unspecified: Secondary | ICD-10-CM | POA: Diagnosis not present

## 2018-08-18 ENCOUNTER — Ambulatory Visit: Payer: BLUE CROSS/BLUE SHIELD | Admitting: Radiation Oncology

## 2018-08-18 ENCOUNTER — Ambulatory Visit
Admission: RE | Admit: 2018-08-18 | Discharge: 2018-08-18 | Disposition: A | Discharge status: Home / Self Care | Payer: BLUE CROSS/BLUE SHIELD | Source: Ambulatory Visit | Attending: Radiation Oncology | Admitting: Radiation Oncology

## 2018-08-18 DIAGNOSIS — C7802 Secondary malignant neoplasm of left lung: Secondary | ICD-10-CM | POA: Diagnosis not present

## 2018-08-18 DIAGNOSIS — C7931 Secondary malignant neoplasm of brain: Secondary | ICD-10-CM | POA: Diagnosis not present

## 2018-08-18 DIAGNOSIS — Z510 Encounter for antineoplastic radiation therapy: Secondary | ICD-10-CM | POA: Diagnosis not present

## 2018-08-18 DIAGNOSIS — C439 Malignant melanoma of skin, unspecified: Secondary | ICD-10-CM | POA: Diagnosis not present

## 2018-08-19 ENCOUNTER — Ambulatory Visit: Payer: BLUE CROSS/BLUE SHIELD | Admitting: Radiation Oncology

## 2018-08-19 ENCOUNTER — Ambulatory Visit
Admission: RE | Admit: 2018-08-19 | Discharge: 2018-08-19 | Disposition: A | Discharge status: Home / Self Care | Payer: BLUE CROSS/BLUE SHIELD | Source: Ambulatory Visit | Attending: Radiation Oncology | Admitting: Radiation Oncology

## 2018-08-19 DIAGNOSIS — Z510 Encounter for antineoplastic radiation therapy: Secondary | ICD-10-CM | POA: Diagnosis not present

## 2018-08-19 DIAGNOSIS — C7931 Secondary malignant neoplasm of brain: Secondary | ICD-10-CM | POA: Diagnosis not present

## 2018-08-19 DIAGNOSIS — C7802 Secondary malignant neoplasm of left lung: Secondary | ICD-10-CM | POA: Diagnosis not present

## 2018-08-19 DIAGNOSIS — C439 Malignant melanoma of skin, unspecified: Secondary | ICD-10-CM | POA: Diagnosis not present

## 2018-08-22 ENCOUNTER — Ambulatory Visit
Admission: RE | Admit: 2018-08-22 | Discharge: 2018-08-22 | Disposition: A | Discharge status: Home / Self Care | Payer: BLUE CROSS/BLUE SHIELD | Source: Ambulatory Visit | Attending: Radiation Oncology | Admitting: Radiation Oncology

## 2018-08-22 ENCOUNTER — Ambulatory Visit: Payer: BLUE CROSS/BLUE SHIELD | Admitting: Radiation Oncology

## 2018-08-22 DIAGNOSIS — Z510 Encounter for antineoplastic radiation therapy: Secondary | ICD-10-CM | POA: Diagnosis not present

## 2018-08-22 DIAGNOSIS — C7931 Secondary malignant neoplasm of brain: Secondary | ICD-10-CM | POA: Diagnosis not present

## 2018-08-22 DIAGNOSIS — C7802 Secondary malignant neoplasm of left lung: Secondary | ICD-10-CM | POA: Diagnosis not present

## 2018-08-22 DIAGNOSIS — C439 Malignant melanoma of skin, unspecified: Secondary | ICD-10-CM | POA: Diagnosis not present

## 2018-08-22 NOTE — Progress Notes (Signed)
  Radiation Oncology         [phone] ________________________________  Name: Leslie [name] MRN: [id number]  Date: 08/11/2018  DOB: December 16, 1975  SIMULATION AND TREATMENT PLANNING NOTE  DIAGNOSIS:     ICD-10-CM   1. Malignant neoplasm metastatic to left lung ([place]) C78.02      Site:  Left lung  NARRATIVE:  The patient was brought to the [facility].  Identity was confirmed.  All relevant records and images related to the planned course of therapy were reviewed.   Written consent to proceed with treatment was confirmed which was freely given after reviewing the details related to the planned course of therapy had been reviewed with the patient.  Then, the patient was set-up in a stable reproducible  supine position for radiation therapy.  CT images were obtained.  Surface markings were placed.    Medically necessary complex treatment device(s) for immobilization:   1.  Customized BodyFix bag 2.  Customized Accu form device   The CT images were loaded into the planning software.  Then the target and avoidance structures were contoured.  Treatment planning then occurred.  The radiation prescription was entered and confirmed.  A total of 3 complex treatment devices were fabricated which relate to the designed radiation treatment fields. Each of these customized fields/ complex treatment devices will be used on a daily basis during the radiation course. I have requested : Intensity Modulated Radiotherapy (IMRT) is medically necessary for this case for the following reason: Sparing of critical adjacent normal structures including the spinal cord, heart, and stomach.   The patient will undergo daily image guidance to ensure accurate localization of the target, and adequate minimize dose to the normal surrounding structures in close proximity to the target.   PLAN:  The patient will receive 50 Gy in 10 fractions.  ________________________________   [name], MD,  PhD

## 2018-08-23 ENCOUNTER — Ambulatory Visit
Admission: RE | Admit: 2018-08-23 | Discharge: 2018-08-23 | Disposition: A | Discharge status: Home / Self Care | Payer: BLUE CROSS/BLUE SHIELD | Source: Ambulatory Visit | Attending: Radiation Oncology | Admitting: Radiation Oncology

## 2018-08-23 ENCOUNTER — Ambulatory Visit: Payer: BLUE CROSS/BLUE SHIELD | Admitting: Radiation Oncology

## 2018-08-23 DIAGNOSIS — C7802 Secondary malignant neoplasm of left lung: Secondary | ICD-10-CM | POA: Diagnosis not present

## 2018-08-23 DIAGNOSIS — C439 Malignant melanoma of skin, unspecified: Secondary | ICD-10-CM | POA: Diagnosis not present

## 2018-08-23 DIAGNOSIS — Z510 Encounter for antineoplastic radiation therapy: Secondary | ICD-10-CM | POA: Diagnosis not present

## 2018-08-23 DIAGNOSIS — C7931 Secondary malignant neoplasm of brain: Secondary | ICD-10-CM | POA: Diagnosis not present

## 2018-08-24 ENCOUNTER — Encounter: Payer: Self-pay | Admitting: Radiation Oncology

## 2018-08-24 ENCOUNTER — Ambulatory Visit: Payer: BLUE CROSS/BLUE SHIELD | Admitting: Radiation Oncology

## 2018-08-24 ENCOUNTER — Ambulatory Visit
Admission: RE | Admit: 2018-08-24 | Discharge: 2018-08-24 | Disposition: A | Discharge status: Home / Self Care | Payer: BLUE CROSS/BLUE SHIELD | Source: Ambulatory Visit | Attending: Radiation Oncology | Admitting: Radiation Oncology

## 2018-08-24 DIAGNOSIS — C7802 Secondary malignant neoplasm of left lung: Secondary | ICD-10-CM | POA: Diagnosis not present

## 2018-08-24 DIAGNOSIS — Z510 Encounter for antineoplastic radiation therapy: Secondary | ICD-10-CM | POA: Diagnosis not present

## 2018-08-24 DIAGNOSIS — C439 Malignant melanoma of skin, unspecified: Secondary | ICD-10-CM | POA: Diagnosis not present

## 2018-08-24 DIAGNOSIS — Z923 Personal history of irradiation: Secondary | ICD-10-CM

## 2018-08-24 DIAGNOSIS — C7931 Secondary malignant neoplasm of brain: Secondary | ICD-10-CM | POA: Diagnosis not present

## 2018-08-24 HISTORY — DX: Personal history of irradiation: Z92.3

## 2018-08-25 ENCOUNTER — Ambulatory Visit: Payer: BLUE CROSS/BLUE SHIELD | Admitting: Nurse Practitioner

## 2018-08-25 ENCOUNTER — Other Ambulatory Visit: Payer: BLUE CROSS/BLUE SHIELD

## 2018-08-25 ENCOUNTER — Ambulatory Visit: Payer: BLUE CROSS/BLUE SHIELD

## 2018-08-30 ENCOUNTER — Telehealth: Payer: Self-pay | Admitting: Medical Oncology

## 2018-08-30 ENCOUNTER — Other Ambulatory Visit: Payer: Self-pay | Admitting: Internal Medicine

## 2018-08-30 DIAGNOSIS — C3432 Malignant neoplasm of lower lobe, left bronchus or lung: Secondary | ICD-10-CM

## 2018-08-30 NOTE — Telephone Encounter (Signed)
Returned call per orders [facility] appt for labs and visit Friday . LVM on pts phone.

## 2018-08-30 NOTE — Progress Notes (Signed)
  Radiation Oncology         [phone] ________________________________  Name: Leslie [name] MRN: [id number]  Date: 08/24/2018  DOB: 06-Sep-1976  End of Treatment Note  Diagnosis:   43 y.o. 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  She does have increased fatigue but otherwise did very well with treatment without any significant difficulties.  Plan: The patient has completed radiation treatment. The patient will return to radiation oncology clinic for routine followup in one month. I advised them to call or return sooner if they have any questions or concerns related to their recovery or treatment.  ------------------------------------------------  [name], MD, PhD  This document serves as a record of services personally performed by Kyung Rudd, MD. It was created on his behalf by Rae Lips, a trained medical scribe. The creation of this record is based on the scribe's personal observations and the provider's statements to them. This document has been checked and approved by the attending provider.

## 2018-08-31 ENCOUNTER — Telehealth: Payer: Self-pay | Admitting: Internal Medicine

## 2018-08-31 NOTE — Telephone Encounter (Signed)
Pt scheduled per 9/17 sch message.

## 2018-09-01 ENCOUNTER — Ambulatory Visit
Admission: RE | Admit: 2018-09-01 | Discharge: 2018-09-01 | Disposition: A | Discharge status: Home / Self Care | Payer: BLUE CROSS/BLUE SHIELD | Source: Ambulatory Visit | Attending: Radiation Oncology | Admitting: Radiation Oncology

## 2018-09-01 ENCOUNTER — Other Ambulatory Visit: Payer: Self-pay | Admitting: Radiation Therapy

## 2018-09-01 DIAGNOSIS — C7949 Secondary malignant neoplasm of other parts of nervous system: Principal | ICD-10-CM

## 2018-09-01 DIAGNOSIS — C801 Malignant (primary) neoplasm, unspecified: Secondary | ICD-10-CM | POA: Diagnosis not present

## 2018-09-01 DIAGNOSIS — C7931 Secondary malignant neoplasm of brain: Secondary | ICD-10-CM

## 2018-09-01 MED ORDER — GADOBENATE DIMEGLUMINE 529 MG/ML IV SOLN
13.0000 mL | Freq: Once | INTRAVENOUS | Status: AC | PRN
  Administered 2018-09-01: 13 mL via INTRAVENOUS

## 2018-09-01 NOTE — Progress Notes (Signed)
Leslie [name] 42 y.o. 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 Wt Readings from Last 3 Encounters:  09/07/18 142 lb 3.2 oz (64.5 kg)  09/02/18 142 lb 9.6 oz (64.7 kg)  07/14/18 126 lb 6.4 oz (57.3 kg)  BP 112/76 (BP Location: Right Arm, Patient Position: Sitting)   Pulse 67   Temp 98.3 F (36.8 C) (Oral)   Resp 18   Ht 5' 9"  (1.753 m)   Wt 142 lb 3.2 oz (64.5 kg)   SpO2 100%   BMI 21.00 kg/m

## 2018-09-02 ENCOUNTER — Inpatient Hospital Stay: Payer: BLUE CROSS/BLUE SHIELD | Attending: Internal Medicine

## 2018-09-02 ENCOUNTER — Telehealth: Payer: Self-pay

## 2018-09-02 ENCOUNTER — Encounter: Payer: Self-pay | Admitting: Internal Medicine

## 2018-09-02 ENCOUNTER — Telehealth: Payer: Self-pay | Admitting: Radiation Therapy

## 2018-09-02 ENCOUNTER — Inpatient Hospital Stay (HOSPITAL_BASED_OUTPATIENT_CLINIC_OR_DEPARTMENT_OTHER): Payer: BLUE CROSS/BLUE SHIELD | Admitting: Internal Medicine

## 2018-09-02 VITALS — BP 115/75 | HR 70 | Temp 98.2°F | Resp 18 | Ht 69.0 in | Wt 142.6 lb

## 2018-09-02 DIAGNOSIS — Z5112 Encounter for antineoplastic immunotherapy: Secondary | ICD-10-CM | POA: Diagnosis not present

## 2018-09-02 DIAGNOSIS — C3432 Malignant neoplasm of lower lobe, left bronchus or lung: Secondary | ICD-10-CM

## 2018-09-02 DIAGNOSIS — Z79899 Other long term (current) drug therapy: Secondary | ICD-10-CM | POA: Diagnosis not present

## 2018-09-02 DIAGNOSIS — Z7189 Other specified counseling: Secondary | ICD-10-CM

## 2018-09-02 DIAGNOSIS — C7931 Secondary malignant neoplasm of brain: Secondary | ICD-10-CM

## 2018-09-02 DIAGNOSIS — Z923 Personal history of irradiation: Secondary | ICD-10-CM | POA: Insufficient documentation

## 2018-09-02 DIAGNOSIS — R5382 Chronic fatigue, unspecified: Secondary | ICD-10-CM

## 2018-09-02 LAB — CMP (CANCER CENTER ONLY)
ALT: 11 U/L (ref 0–44)
AST: 21 U/L (ref 15–41)
Albumin: 3.5 g/dL (ref 3.5–5.0)
Alkaline Phosphatase: 106 U/L (ref 38–126)
Anion gap: 8 (ref 5–15)
BUN: 11 mg/dL (ref 6–20)
CO2: 27 mmol/L (ref 22–32)
Calcium: 9.5 mg/dL (ref 8.9–10.3)
Chloride: 106 mmol/L (ref 98–111)
Creatinine: 0.77 mg/dL (ref 0.44–1.00)
GFR, Est AFR Am: >60 mL/min (ref >60)
GFR, Estimated: >60 mL/min (ref >60)
Glucose, Bld: 52 mg/dL — ABNORMAL LOW (ref 70–99)
Potassium: 4.3 mmol/L (ref 3.5–5.1)
Sodium: 141 mmol/L (ref 135–145)
Total Bilirubin: 0.3 mg/dL (ref 0.3–1.2)
Total Protein: 7.1 g/dL (ref 6.5–8.1)

## 2018-09-02 LAB — CBC WITH DIFFERENTIAL (CANCER CENTER ONLY)
Basophils Absolute: 0 10*3/uL (ref 0.0–0.1)
Basophils Relative: 0 %
Eosinophils Absolute: 0.1 10*3/uL (ref 0.0–0.5)
Eosinophils Relative: 1 %
HCT: 37.6 % (ref 34.8–46.6)
Hemoglobin: 11.8 g/dL (ref 11.6–15.9)
Lymphocytes Relative: 5 %
Lymphs Abs: 0.4 10*3/uL — ABNORMAL LOW (ref 0.9–3.3)
MCH: 27.6 pg (ref 25.1–34.0)
MCHC: 31.4 g/dL — ABNORMAL LOW (ref 31.5–36.0)
MCV: 87.9 fL (ref 79.5–101.0)
Monocytes Absolute: 0.9 10*3/uL (ref 0.1–0.9)
Monocytes Relative: 11 %
Neutro Abs: 6.4 10*3/uL (ref 1.5–6.5)
Neutrophils Relative %: 83 %
Platelet Count: 308 10*3/uL (ref 145–400)
RBC: 4.28 MIL/uL (ref 3.70–5.45)
RDW: 15.4 % — ABNORMAL HIGH (ref 11.2–14.5)
WBC Count: 7.7 10*3/uL (ref 3.9–10.3)

## 2018-09-02 LAB — TSH: TSH: 2.392 u[IU]/mL (ref 0.308–3.960)

## 2018-09-02 NOTE — Telephone Encounter (Signed)
Received a call from [facility] requesting assistance in getting her Chest, Abd and Pelvis restaging CT set up after the 10/17 infusion of Keytruda. I let her know that since Dr. Julien Nordmann is the one ordering those tests, Medical Oncology would be the team to help her out with this, but that I would let them know that she called.   I called and left a message on Dr. Worthy Flank nurse line informing them of Leslie [name]'s request.    Mont Dutton R.T.(R)(T) Special Procedures Navigator

## 2018-09-02 NOTE — Telephone Encounter (Signed)
Printed avs and calender of upcoming appointment. Per 9/20 los

## 2018-09-02 NOTE — Progress Notes (Signed)
ON PATHWAY REGIMEN - Melanoma  No Change  Continue With Treatment as Ordered.     A cycle is 21 days:     Pembrolizumab   **Always confirm dose/schedule in your pharmacy ordering system**  Patient Characteristics: Stage IV, Unresectable, Brain Metastases, First Line, BRAF V600 Wild Type / BRAF V600 Results Pending or Unknown Disease Subtype: Unknown Current Disease Status: Distant Metastases AJCC 8 Stage Grouping: IV AJCC T Category: TX AJCC N Category: N0 AJCC M Category: M1d(1) Mutation Status: Awaiting BRAF V600 Results Metastatic Disease Type: Brain Metastases Line of Therapy: First Line Intent of Therapy: Non-Curative / Palliative Intent, Discussed with Patient 

## 2018-09-02 NOTE — Progress Notes (Addendum)
[place] [phone]) [phone]   Fax:(336) [phone]  OFFICE PROGRESS NOTE  System, Pcp Not In No address on file  DIAGNOSIS: Metastatic high-grade neoplasm highly suspicious for metastatic malignant melanoma presented with large left left upper/left lower lobe mass with a hypermetabolic AP window lymph node as well as solitary metastatic brain lesion diagnosed in May 2019.  Biomarker Findings Microsatellite status - MS-Stable Tumor Mutational Burden - TMB-Intermediate (16 Muts/Mb) Genomic Findings For a complete list of the genes assayed, please refer to the Appendix. CD274 (PD-L1) amplification NRAS Q61R PDCD1LG2 (PD-L2) amplification MYC amplification EPHB1 amplification JAK2 amplification RB1 Q93*, P543* TERT promoter -146C>T TP53 C275W  PRIOR THERAPY: 1) left temporal craniotomy with resection of tumor with intraoperative stereotactic guidance for volumetric resection under the care of Dr. Annette Stable on 04/29/2018. 2) First-line treatment with immunotherapy with Keytruda 200 mg IV every 3 weeks.  First dose June 02, 2018.  Status post 3 cycles.  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 3) palliative radiotherapy to the left lung mass under the care of Dr. Lisbeth Renshaw completed on 08/24/2018.  CURRENT THERAPY: Resuming her treatment again with Keytruda 200 mg IV every 3 weeks, first dose 09/08/2018.  INTERVAL HISTORY: Nishtha Raider 42 y.o. female returns to the clinic today for follow-up visit.  The patient is feeling fine today with no concerning complaints.  She completed a course of palliative radiotherapy to the left upper lobe lung mass under the care of Dr. Lisbeth Renshaw on 08/24/2018.  She tolerated this course well.  The patient also was seen at [facility] by Dr. Emelda Brothers.  They reviewed her pathology slides and again still consistent with poorly differentiated neoplasm.  Dr. Emelda Brothers recommended for the patient to  resume her treatment with [facility] for now until she undergo further molecular studies which may take several weeks.  The patient is here today for evaluation and discussion of her treatment.  She denied having any current chest pain, shortness of breath, cough or hemoptysis.  She denied having any fever or chills.  She has no nausea, vomiting, diarrhea or constipation.  She has no recent weight loss or night sweats.  She had MRI of the brain performed yesterday.  MEDICAL HISTORY: Past Medical History:  Diagnosis Date  . Migraines   . Yeast infection     ALLERGIES:  has No Known Allergies.  MEDICATIONS:  Current Outpatient Medications  Medication Sig Dispense Refill  . Ascorbic Acid (LIQUID C 500) 500 MG/15ML LIQD Take 1,000 mg by mouth daily.    . Cholecalciferol 1000 UNIT/10ML LIQD Take by mouth.    . Doxylamine Succinate, Sleep, (UNISOM PO) Take by mouth.    Marland Kitchen GAMMA AMINOBUTYRIC ACID PO Take by mouth.    Marland Kitchen HCA CALCIUM-MAGNESIUM-ZINC PO Take by mouth. Taking one half tablespoon daily    . Melatonin 5 MG CAPS Take by mouth.    . Multiple Vitamin (MULTIVITAMIN) tablet Take 1 tablet by mouth daily.    . Omega 3-6-9 Fatty Acids (OMEGA 3-6-9 COMPLEX PO) Take 1 capsule by mouth daily.    Marland Kitchen OVER THE COUNTER MEDICATION     . prochlorperazine (COMPAZINE) 10 MG tablet Take 1 tablet (10 mg total) by mouth every 6 (six) hours as needed for nausea or vomiting. 30 tablet 0   No current facility-administered medications for this visit.     SURGICAL HISTORY:  Past Surgical History:  Procedure Laterality Date  . APPLICATION OF CRANIAL NAVIGATION Left 04/29/2018  Procedure: APPLICATION OF CRANIAL NAVIGATION;  Surgeon: Earnie Larsson, MD;  Location: [place];  Service: Neurosurgery;  Laterality: Left;  . CRANIOTOMY Left 04/29/2018   Procedure: LEFT CRANIOTOMY FOR  TUMOR BRAIN LAB;  Surgeon: Earnie Larsson, MD;  Location: [place];  Service: Neurosurgery;  Laterality: Left;    REVIEW OF SYSTEMS:   Constitutional: negative Eyes: negative Ears, nose, mouth, throat, and face: negative Respiratory: negative Cardiovascular: negative Gastrointestinal: negative Genitourinary:negative Integument/breast: negative Hematologic/lymphatic: negative Musculoskeletal:negative Neurological: negative Behavioral/Psych: negative Endocrine: negative Allergic/Immunologic: negative   PHYSICAL EXAMINATION: General appearance: alert, cooperative and no distress Head: Normocephalic, without obvious abnormality, atraumatic Neck: no adenopathy, no JVD, supple, symmetrical, trachea midline and thyroid not enlarged, symmetric, no tenderness/mass/nodules Lymph nodes: Cervical, supraclavicular, and axillary nodes normal. Resp: clear to auscultation bilaterally Back: symmetric, no curvature. ROM normal. No CVA tenderness. Cardio: regular rate and rhythm, S1, S2 normal, no murmur, click, rub or gallop GI: soft, non-tender; bowel sounds normal; no masses,  no organomegaly Extremities: extremities normal, atraumatic, no cyanosis or edema Neurologic: Alert and oriented X 3, normal strength and tone. Normal symmetric reflexes. Normal coordination and gait  ECOG PERFORMANCE STATUS: 0 - Asymptomatic  Blood pressure 115/75, pulse 70, temperature 98.2 F (36.8 C), temperature source Oral, resp. rate 18, height 5' 9"  (1.753 m), weight 142 lb 9.6 oz (64.7 kg), SpO2 100 %, unknown if currently breastfeeding.  LABORATORY DATA: Lab Results  Component Value Date   WBC 13.2 (H) 08/04/2018   HGB 12.0 08/04/2018   HCT 36.6 08/04/2018   MCV 83.9 08/04/2018   PLT 408 (H) 08/04/2018      Chemistry      Component Value Date/Time   NA 141 08/04/2018 0843   K 4.2 08/04/2018 0843   CL 108 08/04/2018 0843   CO2 25 08/04/2018 0843   BUN 13 08/04/2018 0843   CREATININE 0.73 08/04/2018 0843      Component Value Date/Time   CALCIUM 9.6 08/04/2018 0843   ALKPHOS 115 08/04/2018 0843   AST 22 08/04/2018 0843   ALT 15  08/04/2018 0843   BILITOT <0.2 (L) 08/04/2018 0843       RADIOGRAPHIC STUDIES: Mr Jeri Cos SW Contrast  Result Date: 09/01/2018 CLINICAL DATA:  Follow-up treated brain metastases. Single left temporal metastasis with resection. EXAM: MRI HEAD WITHOUT AND WITH CONTRAST TECHNIQUE: Multiplanar, multiecho pulse sequences of the brain and surrounding structures were obtained without and with intravenous contrast. CONTRAST:  37m MULTIHANCE GADOBENATE DIMEGLUMINE 529 MG/ML IV SOLN COMPARISON:  05/23/2018 FINDINGS: Brain: Left temporal lobe metastasis resection with interval collapse of the cavity that shows smooth peripheral enhancement best seen on sagittal postcontrast imaging. There is newly seen white matter edematous signal in the more anterior temporal pole. No interval metastasis noted. Vascular: Major flow voids and vascular enhancements are preserved Skull and upper cervical spine: Small sclerotic focus in the C3 body is stable. No aggressive bone lesion Sinuses/Orbits: Negative IMPRESSION: 1. Interval collapse of left temporal resection cavity with new peripheral enhancement that is smooth and favored postoperative if stable on follow-up. New mild vasogenic edema in the left temporal pole. 2. No interval metastasis. Electronically Signed   By: JMonte FantasiaM.D.   On: 09/01/2018 13:35    ASSESSMENT AND PLAN: This is a very pleasant 42years old white female with highly suspicious metastatic malignant melanoma presented with large mass in the left upper/left lower lobe and mediastinal lymphadenopathy as well as solitary brain metastasis status post left temporal craniotomy and resection  of tumor on 04/29/2018 and she is recovering well from her surgery. The patient completed stereotactic radiotherapy to the resection cavity next week under the care of Dr. Lisbeth Renshaw. The patient underwent treatment with immunotherapy with Keytruda 200 mg IV every 3 weeks status post 3 cycles.   She had evidence for  disease progression after the 3 cycles.  This was also suspicious for pseudo-progression on immunotherapy.  The patient was started on a palliative course of radiotherapy to the left upper lobe lung mass and she tolerated this treatment fairly well.  She was seen by Dr. Emelda Brothers at [facility] and she recommended for the patient to resume her treatment with [facility] for now until she undergoes further molecular studies. The patient is feeling fine today with no concerning complaints. I recommended for her to resume her treatment with [facility] and she is expected to start cycle #4 next week.  I will see her back for follow-up visit in 4 weeks for evaluation before starting cycle #5. Her recent MRI of the brain showed no concerning findings for disease progression.  She is scheduled to see Dr. Annette Stable, neurosurgery next week. The patient was advised to call immediately if she has any concerning symptoms in the interval. The patient voices understanding of current disease status and treatment options and is in agreement with the current care plan. All questions were answered. The patient knows to call the clinic with any problems, questions or concerns. We can certainly see the patient much sooner if necessary.   Disclaimer: This note was dictated with voice recognition software. Similar sounding words can inadvertently be transcribed and may not be corrected upon review.

## 2018-09-05 ENCOUNTER — Inpatient Hospital Stay: Payer: BLUE CROSS/BLUE SHIELD

## 2018-09-05 ENCOUNTER — Inpatient Hospital Stay: Payer: BLUE CROSS/BLUE SHIELD | Admitting: Internal Medicine

## 2018-09-07 ENCOUNTER — Telehealth: Payer: Self-pay | Admitting: *Deleted

## 2018-09-07 ENCOUNTER — Other Ambulatory Visit: Payer: Self-pay

## 2018-09-07 ENCOUNTER — Ambulatory Visit
Admission: RE | Admit: 2018-09-07 | Discharge: 2018-09-07 | Disposition: A | Discharge status: Home / Self Care | Payer: BLUE CROSS/BLUE SHIELD | Source: Ambulatory Visit | Attending: Radiation Oncology | Admitting: Radiation Oncology

## 2018-09-07 ENCOUNTER — Encounter: Payer: Self-pay | Admitting: Radiation Oncology

## 2018-09-07 ENCOUNTER — Inpatient Hospital Stay: Payer: BLUE CROSS/BLUE SHIELD

## 2018-09-07 VITALS — BP 112/76 | HR 67 | Temp 98.3°F | Resp 18 | Ht 69.0 in | Wt 142.2 lb

## 2018-09-07 DIAGNOSIS — Z79899 Other long term (current) drug therapy: Secondary | ICD-10-CM | POA: Diagnosis not present

## 2018-09-07 DIAGNOSIS — C801 Malignant (primary) neoplasm, unspecified: Secondary | ICD-10-CM | POA: Insufficient documentation

## 2018-09-07 DIAGNOSIS — C3432 Malignant neoplasm of lower lobe, left bronchus or lung: Secondary | ICD-10-CM

## 2018-09-07 DIAGNOSIS — C7931 Secondary malignant neoplasm of brain: Secondary | ICD-10-CM | POA: Insufficient documentation

## 2018-09-07 DIAGNOSIS — Z923 Personal history of irradiation: Secondary | ICD-10-CM | POA: Insufficient documentation

## 2018-09-07 DIAGNOSIS — R5383 Other fatigue: Secondary | ICD-10-CM | POA: Diagnosis not present

## 2018-09-07 DIAGNOSIS — R079 Chest pain, unspecified: Secondary | ICD-10-CM | POA: Diagnosis not present

## 2018-09-07 NOTE — Progress Notes (Addendum)
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 In summary this is a pleasant 42 y.o. woman who initially presented in the summer with a solitary brain tumor who underwent surgical resection leading to a diagnosis of high grade carcinoma. She received postoperative SRS to the site. She was found to have a large lung tumor and we initially considered palliative radiotherapy versus surgery, but the decision was to proceed with systemic therapy with Keytruda as her path was reviewed by several facilities and felt to be most consistent with melanoma. She received 3 cycles of treatment and repeat imaging on 08/01/18 revealed progression of her left lung mass to  Measure 8 x 5.6 cm (previously 5.6 x 3.9 cm). She did not have any additional areas of progression.  She was seen at [facility] by Dr. Emelda Brothers, and the plan is to proceed with additional testing for impact molecular profile.  This could take additional 4 weeks from now.  The plan is to repeat systemic imaging, and with the hopes that the lesion in the lung has improved in size, consider second opinion with cardiothoracic surgery for resection, and continuation of Keytruda in the interim.  She did undergo imaging of the brain on 09/01/2018 and this is her first posttreatment scan.  It revealed interval collapse of the resection cavity in the left  temporal lobe with smooth peripheral enhancement with white matter edematous signal in the more anterior aspect of the temporal pole.  No evidence of new disease or concerns for recurrent disease were noted, and her case was discussed in multidisciplinary brain and spine oncology conference.  The recommendations were for her to return in 3 months for repeat imaging with MRI scan.                On review of systems, the patient reports that she is doing well overall.  She is recovering from some fatigue however is still active and exercising and doing her normal routine.  She is still working.  She states that the last 2 treatments she did experience a little bit of chest pressure, though this has not persisted or progress or recurred.  She describes that this extended into the neck at that time but has since resolved.  Today she denies any chest pain, shortness of breath, cough, fevers, chills, night sweats, unintended weight changes. She denies any bowel or bladder disturbances, and denies abdominal pain, nausea or vomiting. She is denies any new musculoskeletal or joint aches or pains, new skin lesions or concerns. A complete review of systems is obtained and is otherwise negative.  Past Medical History:  Past Medical History:  Diagnosis Date  . Migraines   . Yeast infection     Past Surgical History: Past Surgical History:  Procedure Laterality Date  . APPLICATION OF CRANIAL NAVIGATION Left 04/29/2018   Procedure: APPLICATION OF CRANIAL NAVIGATION;  Surgeon:  Earnie Larsson, MD;  Location: [place];  Service: Neurosurgery;  Laterality: Left;  . CRANIOTOMY Left 04/29/2018   Procedure: LEFT CRANIOTOMY FOR  TUMOR BRAIN LAB;  Surgeon: Earnie Larsson, MD;  Location: [place];  Service: Neurosurgery;  Laterality: Left;    Social History:  Social History   Socioeconomic History  . Marital status: Divorced    Spouse name: Not on file  . Number of children: Not on file  . Years of education: Not on file  .  Highest education level: Not on file  Occupational History  . Not on file  Social Needs  . Financial resource strain: Not on file  . Food insecurity:    Worry: Not on file    Inability: Not on file  . Transportation needs:    Medical: Not on file    Non-medical: Not on file  Tobacco Use  . Smoking status: Never Smoker  . Smokeless tobacco: Never Used  Substance and Sexual Activity  . Alcohol use: Yes  . Drug use: No  . Sexual activity: Yes    Birth control/protection: Condom  Lifestyle  . Physical activity:    Days per week: Not on file    Minutes per session: Not on file  . Stress: Not on file  Relationships  . Social connections:    Talks on phone: Not on file    Gets together: Not on file    Attends religious service: Not on file    Active member of club or organization: Not on file    Attends meetings of clubs or organizations: Not on file    Relationship status: Not on file  . Intimate partner violence:    Fear of current or ex partner: No    Emotionally abused: No    Physically abused: No    Forced sexual activity: No  Other Topics Concern  . Not on file  Social History Narrative  . Not on file    Family History: Family History  Problem Relation Age of Onset  . Cancer Father        lung    ALLERGIES:  has No Known Allergies.  Meds: Current Outpatient Medications  Medication Sig Dispense Refill  . Ascorbic Acid (LIQUID C 500) 500 MG/15ML LIQD Take 1,000 mg by mouth daily.    . Cholecalciferol 1000 UNIT/10ML LIQD Take by mouth.    . Doxylamine Succinate, Sleep, (UNISOM PO) Take by mouth.    Marland Kitchen GAMMA AMINOBUTYRIC ACID PO Take by mouth.    Marland Kitchen HCA CALCIUM-MAGNESIUM-ZINC PO Take by mouth. Taking one half tablespoon daily    . Melatonin 5 MG CAPS Take by mouth.    . Multiple Vitamin (MULTIVITAMIN) tablet Take 1 tablet by mouth daily.    . Omega 3-6-9 Fatty Acids (OMEGA 3-6-9 COMPLEX PO) Take 1 capsule by mouth daily.    Marland Kitchen OVER THE COUNTER MEDICATION     .  prochlorperazine (COMPAZINE) 10 MG tablet Take 1 tablet (10 mg total) by mouth every 6 (six) hours as needed for nausea or vomiting. (Patient not taking: Reported on 09/07/2018) 30 tablet 0   No current facility-administered medications for this encounter.     Physical Findings:  height is 5' 9"  (1.753 m) and weight is 142 lb 3.2 oz (64.5 kg). Her oral temperature is 98.3 F (36.8 C). Her blood pressure is 112/76 and her pulse is 67. Her respiration is 18 and oxygen saturation is 100%.  Pain Assessment Pain Score: 0-No pain/10 In general  this is a well appearing caucasian female in no acute distress. She's alert and oriented x4 and appropriate throughout the examination. Cardiopulmonary assessment is negative for acute distress and she exhibits normal effort. Her left lateral craniotomy incision site is well healed.  No focal neurologic findings are appreciated.  Lab Findings: Lab Results  Component Value Date   WBC 7.7 09/02/2018   HGB 11.8 09/02/2018   HCT 37.6 09/02/2018   MCV 87.9 09/02/2018   PLT 308 09/02/2018     Radiographic Findings: Mr Jeri Cos IO Contrast  Result Date: 09/01/2018 CLINICAL DATA:  Follow-up treated brain metastases. Single left temporal metastasis with resection. EXAM: MRI HEAD WITHOUT AND WITH CONTRAST TECHNIQUE: Multiplanar, multiecho pulse sequences of the brain and surrounding structures were obtained without and with intravenous contrast. CONTRAST:  32m MULTIHANCE GADOBENATE DIMEGLUMINE 529 MG/ML IV SOLN COMPARISON:  05/23/2018 FINDINGS: Brain: Left temporal lobe metastasis resection with interval collapse of the cavity that shows smooth peripheral enhancement best seen on sagittal postcontrast imaging. There is newly seen white matter edematous signal in the more anterior temporal pole. No interval metastasis noted. Vascular: Major flow voids and vascular enhancements are preserved Skull and upper cervical spine: Small sclerotic focus in the C3 body is stable.  No aggressive bone lesion Sinuses/Orbits: Negative IMPRESSION: 1. Interval collapse of left temporal resection cavity with new peripheral enhancement that is smooth and favored postoperative if stable on follow-up. New mild vasogenic edema in the left temporal pole. 2. No interval metastasis. Electronically Signed   By: JMonte FantasiaM.D.   On: 09/01/2018 13:35    Impression/Plan: 1. 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  The patient appears to be doing very well, and we reviewed her results from her brain MRI.  We discussed the rationale to follow-up in 3 months with repeat brain imaging.  Currently she is awaiting further direction for any changes in her systemic therapy and whether or not she would be a candidate for surgical resection of the left lung mass.  Again Dr. GServando Snarehas been aware of her case and has not recommended treatment with surgery prior to her progression or at the time of her progression prior to SBRT.  She is aware that a second opinion may be considered if this is still being discussed.  Otherwise she will continue with plans for her KBeryle Flockas outlined with Dr. MJulien Nordmann and [place]  Overall she appears to be coping extremely well given all of the issues at hand, she is pleased with her quality of life, and we will continue to follow this expectantly.  Regarding her posttreatment course following her lung therapy, she does seem to be doing well, and is not symptomatic at this time.  We will continue to follow this expectantly as well.     ACarola Rhine PAC

## 2018-09-07 NOTE — Telephone Encounter (Signed)
Pt called states " I saw Ebony Hail and my MRI looks good, she said Dr. Julien Nordmann needs to order a CT and PET scan. " Reviewed with MD Returned call to pt, advised MD will order CT scan after 2nd round of treatment. No PET will be ordered at this time. Pt verbalized understanding.

## 2018-09-08 ENCOUNTER — Telehealth: Payer: Self-pay | Admitting: Medical Oncology

## 2018-09-08 ENCOUNTER — Inpatient Hospital Stay: Payer: BLUE CROSS/BLUE SHIELD

## 2018-09-08 ENCOUNTER — Ambulatory Visit: Payer: BLUE CROSS/BLUE SHIELD

## 2018-09-08 VITALS — BP 105/73 | HR 71 | Temp 98.2°F | Resp 17

## 2018-09-08 DIAGNOSIS — C7931 Secondary malignant neoplasm of brain: Secondary | ICD-10-CM

## 2018-09-08 DIAGNOSIS — C3432 Malignant neoplasm of lower lobe, left bronchus or lung: Secondary | ICD-10-CM

## 2018-09-08 DIAGNOSIS — R5382 Chronic fatigue, unspecified: Secondary | ICD-10-CM

## 2018-09-08 DIAGNOSIS — Z5112 Encounter for antineoplastic immunotherapy: Secondary | ICD-10-CM | POA: Diagnosis not present

## 2018-09-08 DIAGNOSIS — C7802 Secondary malignant neoplasm of left lung: Secondary | ICD-10-CM

## 2018-09-08 DIAGNOSIS — Z79899 Other long term (current) drug therapy: Secondary | ICD-10-CM | POA: Diagnosis not present

## 2018-09-08 DIAGNOSIS — Z923 Personal history of irradiation: Secondary | ICD-10-CM | POA: Diagnosis not present

## 2018-09-08 LAB — CMP (CANCER CENTER ONLY)
ALT: 16 U/L (ref 0–44)
AST: 23 U/L (ref 15–41)
Albumin: 3.7 g/dL (ref 3.5–5.0)
Alkaline Phosphatase: 101 U/L (ref 38–126)
Anion gap: 8 (ref 5–15)
BUN: 16 mg/dL (ref 6–20)
CO2: 26 mmol/L (ref 22–32)
Calcium: 9.5 mg/dL (ref 8.9–10.3)
Chloride: 108 mmol/L (ref 98–111)
Creatinine: 0.69 mg/dL (ref 0.44–1.00)
GFR, Est AFR Am: >60 mL/min (ref >60)
GFR, Estimated: >60 mL/min (ref >60)
Glucose, Bld: 62 mg/dL — ABNORMAL LOW (ref 70–99)
Potassium: 4.4 mmol/L (ref 3.5–5.1)
Sodium: 142 mmol/L (ref 135–145)
Total Bilirubin: 0.3 mg/dL (ref 0.3–1.2)
Total Protein: 7.3 g/dL (ref 6.5–8.1)

## 2018-09-08 LAB — CBC WITH DIFFERENTIAL (CANCER CENTER ONLY)
Basophils Absolute: 0.1 10*3/uL (ref 0.0–0.1)
Basophils Relative: 1 %
Eosinophils Absolute: 0.1 10*3/uL (ref 0.0–0.5)
Eosinophils Relative: 1 %
HCT: 36.2 % (ref 34.8–46.6)
Hemoglobin: 11.9 g/dL (ref 11.6–15.9)
Lymphocytes Relative: 6 %
Lymphs Abs: 0.4 10*3/uL — ABNORMAL LOW (ref 0.9–3.3)
MCH: 27.6 pg (ref 25.1–34.0)
MCHC: 32.9 g/dL (ref 31.5–36.0)
MCV: 84 fL (ref 79.5–101.0)
Monocytes Absolute: 0.6 10*3/uL (ref 0.1–0.9)
Monocytes Relative: 8 %
Neutro Abs: 5.9 10*3/uL (ref 1.5–6.5)
Neutrophils Relative %: 84 %
Platelet Count: 278 10*3/uL (ref 145–400)
RBC: 4.31 MIL/uL (ref 3.70–5.45)
RDW: 16 % — ABNORMAL HIGH (ref 11.2–14.5)
WBC Count: 7 10*3/uL (ref 3.9–10.3)

## 2018-09-08 LAB — TSH: TSH: 2.016 u[IU]/mL (ref 0.308–3.960)

## 2018-09-08 LAB — LACTATE DEHYDROGENASE: LDH: 185 U/L (ref 98–192)

## 2018-09-08 MED ORDER — SODIUM CHLORIDE 0.9 % IV SOLN
200.0000 mg | Freq: Once | INTRAVENOUS | Status: AC
  Administered 2018-09-08: 200 mg via INTRAVENOUS
  Filled 2018-09-08: qty 8

## 2018-09-08 MED ORDER — SODIUM CHLORIDE 0.9 % IV SOLN
Freq: Once | INTRAVENOUS | Status: AC
  Administered 2018-09-08: 10:00:00 via INTRAVENOUS
  Filled 2018-09-08: qty 250

## 2018-09-08 NOTE — Patient Instructions (Addendum)
[place] Discharge Instructions for Patients Receiving Chemotherapy  Today you received the following chemotherapy agents:  Keytruda.  To help prevent nausea and vomiting after your treatment, we encourage you to take your nausea medication as directed.   If you develop nausea and vomiting that is not controlled by your nausea medication, call the clinic.   BELOW ARE SYMPTOMS THAT SHOULD BE REPORTED IMMEDIATELY:  *FEVER GREATER THAN 100.5 F  *CHILLS WITH OR WITHOUT FEVER  NAUSEA AND VOMITING THAT IS NOT CONTROLLED WITH YOUR NAUSEA MEDICATION  *UNUSUAL SHORTNESS OF BREATH  *UNUSUAL BRUISING OR BLEEDING  TENDERNESS IN MOUTH AND THROAT WITH OR WITHOUT PRESENCE OF ULCERS  *URINARY PROBLEMS  *BOWEL PROBLEMS  UNUSUAL RASH Items with * indicate a potential emergency and should be followed up as soon as possible.  Feel free to call the clinic should you have any questions or concerns. The clinic phone number is [phone].  Please show the [place] at check-in to the Emergency Department and triage nurse.

## 2018-09-08 NOTE — Progress Notes (Signed)
Per Mikey Bussing NP, patient's insurance company is requesting peer-to-peer review with Dr. Julien Nordmann. Dr. Julien Nordmann is not in the office today. Currently, there is no authorization from [employer]. Notified Romualdo Bolk, RPh of events. Carolann Littler advises to proceed with treatment regardless of authorization.

## 2018-09-08 NOTE — Telephone Encounter (Signed)
Spoke to Dr Julien Nordmann who said pt may "have pseudo progression of disease " and per [facility] recommendation to continue Keytruda .

## 2018-09-08 NOTE — Telephone Encounter (Signed)
Spoke to operator who will fax NP notes who was in with Dr Emelda Brothers during visit.

## 2018-09-09 DIAGNOSIS — L988 Other specified disorders of the skin and subcutaneous tissue: Secondary | ICD-10-CM | POA: Diagnosis not present

## 2018-09-09 DIAGNOSIS — D485 Neoplasm of uncertain behavior of skin: Secondary | ICD-10-CM | POA: Diagnosis not present

## 2018-09-09 NOTE — Progress Notes (Signed)
Brain and Spine Tumor Board Documentation  Leslie [name] was presented by Cecil Cobbs, MD at Brain and Spine Tumor Board on 09/09/2018, which included representatives from neuro oncology, radiation oncology, surgical oncology, navigation, pathology, radiology.  Leslie [name] was presented as a current patient with history of the following treatments:  .  Additionally, we reviewed previous medical and familial history, history of present illness, and recent lab results along with all available histopathologic and imaging studies. The tumor board considered available treatment options and made the following recommendations:  Active surveillance  Tumor board is a meeting of clinicians from various specialty areas who evaluate and discuss patients for whom a multidisciplinary approach is being considered. Final determinations in the plan of care are those of the provider(s). The responsibility for follow up of recommendations given during tumor board is that of the provider.   Today's extended care, comprehensive team conference, Leslie [name] was not present for the discussion and was not examined.

## 2018-09-15 ENCOUNTER — Ambulatory Visit: Payer: BLUE CROSS/BLUE SHIELD

## 2018-09-15 ENCOUNTER — Ambulatory Visit: Payer: BLUE CROSS/BLUE SHIELD | Admitting: Internal Medicine

## 2018-09-15 ENCOUNTER — Other Ambulatory Visit: Payer: BLUE CROSS/BLUE SHIELD

## 2018-09-20 NOTE — Progress Notes (Signed)
FMLA successfully faxed to [employer] at [phone] on 09/19/18. Mailed copy to patient address on file.

## 2018-09-22 DIAGNOSIS — C7931 Secondary malignant neoplasm of brain: Secondary | ICD-10-CM | POA: Diagnosis not present

## 2018-09-28 ENCOUNTER — Encounter (HOSPITAL_COMMUNITY): Payer: Self-pay

## 2018-09-29 ENCOUNTER — Inpatient Hospital Stay: Payer: BLUE CROSS/BLUE SHIELD

## 2018-09-29 ENCOUNTER — Encounter: Payer: Self-pay | Admitting: Internal Medicine

## 2018-09-29 ENCOUNTER — Inpatient Hospital Stay (HOSPITAL_BASED_OUTPATIENT_CLINIC_OR_DEPARTMENT_OTHER): Payer: BLUE CROSS/BLUE SHIELD | Admitting: Internal Medicine

## 2018-09-29 ENCOUNTER — Inpatient Hospital Stay: Payer: BLUE CROSS/BLUE SHIELD | Attending: Internal Medicine

## 2018-09-29 VITALS — BP 107/69 | HR 69 | Temp 97.8°F | Resp 18 | Ht 69.0 in | Wt 146.1 lb

## 2018-09-29 DIAGNOSIS — Z79899 Other long term (current) drug therapy: Secondary | ICD-10-CM

## 2018-09-29 DIAGNOSIS — R5382 Chronic fatigue, unspecified: Secondary | ICD-10-CM

## 2018-09-29 DIAGNOSIS — C3432 Malignant neoplasm of lower lobe, left bronchus or lung: Secondary | ICD-10-CM

## 2018-09-29 DIAGNOSIS — Z5112 Encounter for antineoplastic immunotherapy: Secondary | ICD-10-CM | POA: Diagnosis not present

## 2018-09-29 DIAGNOSIS — C7802 Secondary malignant neoplasm of left lung: Secondary | ICD-10-CM

## 2018-09-29 DIAGNOSIS — C7931 Secondary malignant neoplasm of brain: Secondary | ICD-10-CM | POA: Diagnosis not present

## 2018-09-29 LAB — CMP (CANCER CENTER ONLY)
ALT: 24 U/L (ref 0–44)
AST: 32 U/L (ref 15–41)
Albumin: 3.9 g/dL (ref 3.5–5.0)
Alkaline Phosphatase: 106 U/L (ref 38–126)
Anion gap: 8 (ref 5–15)
BUN: 15 mg/dL (ref 6–20)
CO2: 24 mmol/L (ref 22–32)
Calcium: 9.5 mg/dL (ref 8.9–10.3)
Chloride: 105 mmol/L (ref 98–111)
Creatinine: 0.75 mg/dL (ref 0.44–1.00)
GFR, Est AFR Am: >60 mL/min (ref >60)
GFR, Estimated: >60 mL/min (ref >60)
Glucose, Bld: 79 mg/dL (ref 70–99)
Potassium: 4.7 mmol/L (ref 3.5–5.1)
Sodium: 137 mmol/L (ref 135–145)
Total Bilirubin: 0.3 mg/dL (ref 0.3–1.2)
Total Protein: 7.3 g/dL (ref 6.5–8.1)

## 2018-09-29 LAB — CBC WITH DIFFERENTIAL (CANCER CENTER ONLY)
Abs Immature Granulocytes: 0.02 10*3/uL (ref 0.00–0.07)
Basophils Absolute: 0 10*3/uL (ref 0.0–0.1)
Basophils Relative: 0 %
Eosinophils Absolute: 0 10*3/uL (ref 0.0–0.5)
Eosinophils Relative: 0 %
HCT: 38.9 % (ref 36.0–46.0)
Hemoglobin: 12.7 g/dL (ref 12.0–15.0)
Immature Granulocytes: 0 %
Lymphocytes Relative: 8 %
Lymphs Abs: 0.6 10*3/uL — ABNORMAL LOW (ref 0.7–4.0)
MCH: 28.1 pg (ref 26.0–34.0)
MCHC: 32.6 g/dL (ref 30.0–36.0)
MCV: 86.1 fL (ref 80.0–100.0)
Monocytes Absolute: 0.6 10*3/uL (ref 0.1–1.0)
Monocytes Relative: 8 %
Neutro Abs: 6.3 10*3/uL (ref 1.7–7.7)
Neutrophils Relative %: 84 %
Platelet Count: 308 10*3/uL (ref 150–400)
RBC: 4.52 MIL/uL (ref 3.87–5.11)
RDW: 15.2 % (ref 11.5–15.5)
WBC Count: 7.7 10*3/uL (ref 4.0–10.5)
nRBC: 0 % (ref 0.0–0.2)

## 2018-09-29 LAB — TSH: TSH: 2.173 u[IU]/mL (ref 0.308–3.960)

## 2018-09-29 MED ORDER — SODIUM CHLORIDE 0.9 % IV SOLN
200.0000 mg | Freq: Once | INTRAVENOUS | Status: AC
  Administered 2018-09-29: 200 mg via INTRAVENOUS
  Filled 2018-09-29: qty 8

## 2018-09-29 MED ORDER — SODIUM CHLORIDE 0.9 % IV SOLN
Freq: Once | INTRAVENOUS | Status: AC
  Administered 2018-09-29: 15:00:00 via INTRAVENOUS
  Filled 2018-09-29: qty 250

## 2018-09-29 NOTE — Progress Notes (Signed)
[place] [phone]   [phone]  OFFICE PROGRESS NOTE  System, Pcp Not In No address on file  DIAGNOSIS: Metastatic high-grade neoplasm highly suspicious for metastatic malignant melanoma presented with large left left upper/left lower lobe mass with a hypermetabolic AP window lymph node as well as solitary metastatic brain lesion diagnosed in May 2019.  Biomarker Findings Microsatellite status - MS-Stable Tumor Mutational Burden - TMB-Intermediate (16 Muts/Mb) Genomic Findings For a complete list of the genes assayed, please refer to the Appendix. CD274 (PD-L1) amplification NRAS Q61R PDCD1LG2 (PD-L2) amplification MYC amplification EPHB1 amplification JAK2 amplification RB1 Q93*, T465* TERT promoter -146C>T TP53 C275W  PRIOR THERAPY: 1) left temporal craniotomy with resection of tumor with intraoperative stereotactic guidance for volumetric resection under the care of Dr. Annette Stable on 04/29/2018. 2) First-line treatment with immunotherapy with Keytruda 200 mg IV every 3 weeks.  First dose June 02, 2018.  Status post 3 cycles.  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 3) palliative radiotherapy to the left lung mass under the care of Dr. Lisbeth Renshaw completed on 08/24/2018.  CURRENT THERAPY: Resuming her treatment again with Keytruda 200 mg IV every 3 weeks, first dose 09/08/2018.  INTERVAL HISTORY: Leslie [name] 42 y.o. female returns to the clinic today for follow-up visit.  The patient tolerated the last dose of her treatment with Keytruda fairly well.  She denied having any chest pain, shortness of breath, cough or hemoptysis.  She denied having any fever or chills.  She has no nausea, vomiting, diarrhea or constipation.  She has no recent weight loss or night sweats.  She is here today for evaluation before starting the 5th dose of her treatment with Keytruda.  The molecular studies performed at  [facility] are still pending.  MEDICAL HISTORY: Past Medical History:  Diagnosis Date  . Migraines   . Yeast infection     ALLERGIES:  has No Known Allergies.  MEDICATIONS:  Current Outpatient Medications  Medication Sig Dispense Refill  . Ascorbic Acid (LIQUID C 500) 500 MG/15ML LIQD Take 1,000 mg by mouth daily.    . Cholecalciferol 1000 UNIT/10ML LIQD Take by mouth.    . Doxylamine Succinate, Sleep, (UNISOM PO) Take by mouth.    Marland Kitchen GAMMA AMINOBUTYRIC ACID PO Take by mouth.    Marland Kitchen HCA CALCIUM-MAGNESIUM-ZINC PO Take by mouth. Taking one half tablespoon daily    . Melatonin 5 MG CAPS Take by mouth.    . Multiple Vitamin (MULTIVITAMIN) tablet Take 1 tablet by mouth daily.    . Omega 3-6-9 Fatty Acids (OMEGA 3-6-9 COMPLEX PO) Take 1 capsule by mouth daily.    Marland Kitchen OVER THE COUNTER MEDICATION     . prochlorperazine (COMPAZINE) 10 MG tablet Take 1 tablet (10 mg total) by mouth every 6 (six) hours as needed for nausea or vomiting. (Patient not taking: Reported on 09/07/2018) 30 tablet 0   No current facility-administered medications for this visit.     SURGICAL HISTORY:  Past Surgical History:  Procedure Laterality Date  . APPLICATION OF CRANIAL NAVIGATION Left 04/29/2018   Procedure: APPLICATION OF CRANIAL NAVIGATION;  Surgeon: Earnie Larsson, MD;  Location: [place];  Service: Neurosurgery;  Laterality: Left;  . CRANIOTOMY Left 04/29/2018   Procedure: LEFT CRANIOTOMY FOR  TUMOR BRAIN LAB;  Surgeon: Earnie Larsson, MD;  Location: [place];  Service: Neurosurgery;  Laterality: Left;    REVIEW OF SYSTEMS:  A comprehensive review of systems was negative.  PHYSICAL EXAMINATION: General appearance: alert, cooperative and no distress Head: Normocephalic, without obvious abnormality, atraumatic Neck: no adenopathy, no JVD, supple, symmetrical, trachea midline and thyroid not enlarged, symmetric, no tenderness/mass/nodules Lymph nodes: Cervical, supraclavicular, and axillary nodes  normal. Resp: clear to auscultation bilaterally Back: negative, symmetric, no curvature. ROM normal. No CVA tenderness. Cardio: regular rate and rhythm, S1, S2 normal, no murmur, click, rub or gallop GI: soft, non-tender; bowel sounds normal; no masses,  no organomegaly Extremities: extremities normal, atraumatic, no cyanosis or edema  ECOG PERFORMANCE STATUS: 0 - Asymptomatic  Blood pressure 107/69, pulse 69, temperature 97.8 F (36.6 C), temperature source Oral, resp. rate 18, height 5' 9"  (1.753 m), weight 146 lb 1.6 oz (66.3 kg), SpO2 100 %, unknown if currently breastfeeding.  LABORATORY DATA: Lab Results  Component Value Date   WBC 7.7 09/29/2018   HGB 12.7 09/29/2018   HCT 38.9 09/29/2018   MCV 86.1 09/29/2018   PLT 308 09/29/2018      Chemistry      Component Value Date/Time   NA 137 09/29/2018 1101   K 4.7 09/29/2018 1101   CL 105 09/29/2018 1101   CO2 24 09/29/2018 1101   BUN 15 09/29/2018 1101   CREATININE 0.75 09/29/2018 1101      Component Value Date/Time   CALCIUM 9.5 09/29/2018 1101   ALKPHOS 106 09/29/2018 1101   AST 32 09/29/2018 1101   ALT 24 09/29/2018 1101   BILITOT 0.3 09/29/2018 1101       RADIOGRAPHIC STUDIES: Mr Jeri Cos YI Contrast  Result Date: 09/01/2018 CLINICAL DATA:  Follow-up treated brain metastases. Single left temporal metastasis with resection. EXAM: MRI HEAD WITHOUT AND WITH CONTRAST TECHNIQUE: Multiplanar, multiecho pulse sequences of the brain and surrounding structures were obtained without and with intravenous contrast. CONTRAST:  1m MULTIHANCE GADOBENATE DIMEGLUMINE 529 MG/ML IV SOLN COMPARISON:  05/23/2018 FINDINGS: Brain: Left temporal lobe metastasis resection with interval collapse of the cavity that shows smooth peripheral enhancement best seen on sagittal postcontrast imaging. There is newly seen white matter edematous signal in the more anterior temporal pole. No interval metastasis noted. Vascular: Major flow voids and  vascular enhancements are preserved Skull and upper cervical spine: Small sclerotic focus in the C3 body is stable. No aggressive bone lesion Sinuses/Orbits: Negative IMPRESSION: 1. Interval collapse of left temporal resection cavity with new peripheral enhancement that is smooth and favored postoperative if stable on follow-up. New mild vasogenic edema in the left temporal pole. 2. No interval metastasis. Electronically Signed   By: JMonte FantasiaM.D.   On: 09/01/2018 13:35    ASSESSMENT AND PLAN: This is a very pleasant 42years old white female with highly suspicious metastatic malignant melanoma presented with large mass in the left upper/left lower lobe and mediastinal lymphadenopathy as well as solitary brain metastasis status post left temporal craniotomy and resection of tumor on 04/29/2018 and she is recovering well from her surgery. The patient completed stereotactic radiotherapy to the resection cavity next week under the care of Dr. MLisbeth Renshaw The patient underwent treatment with immunotherapy with Keytruda 200 mg IV every 3 weeks status post 3 cycles.   Her scan after cycle #3 showed enlargement of the left upper lobe lung mass.  This was also suspicious for pseudo-progression on immunotherapy.  The patient was started on a palliative course of radiotherapy to the left upper lobe lung mass and she tolerated this treatment fairly well.  [facility] the patient to resume her treatment with [facility] for now until she undergoes further molecular studies. She resumed her treatment with Keytruda 3 weeks ago and tolerated the last cycle of her treatment well. I recommended for her to proceed with dose #5 of her treatment today as a scheduled. I will see her back for follow-up visit in 3 weeks for evaluation after repeating CT scan of the chest, abdomen and pelvis for restaging of her disease. She was advised to call immediately if she has  any concerning symptoms in the interval. The patient voices understanding of current disease status and treatment options and is in agreement with the current care plan. All questions were answered. The patient knows to call the clinic with any problems, questions or concerns. We can certainly see the patient much sooner if necessary.   Disclaimer: This note was dictated with voice recognition software. Similar sounding words can inadvertently be transcribed and may not be corrected upon review.

## 2018-10-04 ENCOUNTER — Ambulatory Visit (INDEPENDENT_AMBULATORY_CARE_PROVIDER_SITE_OTHER): Payer: BLUE CROSS/BLUE SHIELD | Admitting: Physician Assistant

## 2018-10-04 DIAGNOSIS — Z23 Encounter for immunization: Secondary | ICD-10-CM

## 2018-10-06 DIAGNOSIS — M62838 Other muscle spasm: Secondary | ICD-10-CM | POA: Diagnosis not present

## 2018-10-06 DIAGNOSIS — M9907 Segmental and somatic dysfunction of upper extremity: Secondary | ICD-10-CM | POA: Diagnosis not present

## 2018-10-06 DIAGNOSIS — M2560 Stiffness of unspecified joint, not elsewhere classified: Secondary | ICD-10-CM | POA: Diagnosis not present

## 2018-10-06 DIAGNOSIS — M9901 Segmental and somatic dysfunction of cervical region: Secondary | ICD-10-CM | POA: Diagnosis not present

## 2018-10-17 ENCOUNTER — Telehealth: Payer: Self-pay | Admitting: Internal Medicine

## 2018-10-17 NOTE — Telephone Encounter (Signed)
Spoke to pt regarding upcoming appts per 10/17 los

## 2018-10-18 ENCOUNTER — Ambulatory Visit (HOSPITAL_COMMUNITY)
Admission: RE | Admit: 2018-10-18 | Discharge: 2018-10-18 | Disposition: A | Discharge status: Home / Self Care | Payer: BLUE CROSS/BLUE SHIELD | Source: Ambulatory Visit | Attending: Internal Medicine | Admitting: Internal Medicine

## 2018-10-18 DIAGNOSIS — J9811 Atelectasis: Secondary | ICD-10-CM | POA: Diagnosis not present

## 2018-10-18 DIAGNOSIS — C439 Malignant melanoma of skin, unspecified: Secondary | ICD-10-CM | POA: Diagnosis not present

## 2018-10-18 DIAGNOSIS — C3432 Malignant neoplasm of lower lobe, left bronchus or lung: Secondary | ICD-10-CM | POA: Diagnosis not present

## 2018-10-18 DIAGNOSIS — R911 Solitary pulmonary nodule: Secondary | ICD-10-CM | POA: Insufficient documentation

## 2018-10-18 DIAGNOSIS — K5900 Constipation, unspecified: Secondary | ICD-10-CM | POA: Insufficient documentation

## 2018-10-18 DIAGNOSIS — Z5111 Encounter for antineoplastic chemotherapy: Secondary | ICD-10-CM | POA: Diagnosis not present

## 2018-10-18 MED ORDER — SODIUM CHLORIDE 0.9 % IJ SOLN
INTRAMUSCULAR | Status: AC
  Filled 2018-10-18: qty 50

## 2018-10-18 MED ORDER — IOPAMIDOL (ISOVUE-300) INJECTION 61%: 100.0000 mL | Freq: Once | INTRAVENOUS | Status: DC | PRN

## 2018-10-18 MED ORDER — IOHEXOL 300 MG/ML  SOLN
100.0000 mL | Freq: Once | INTRAMUSCULAR | Status: AC | PRN
  Administered 2018-10-18: 100 mL via INTRAVENOUS

## 2018-10-20 ENCOUNTER — Inpatient Hospital Stay: Payer: BLUE CROSS/BLUE SHIELD

## 2018-10-20 ENCOUNTER — Encounter: Payer: Self-pay | Admitting: Internal Medicine

## 2018-10-20 ENCOUNTER — Inpatient Hospital Stay (HOSPITAL_BASED_OUTPATIENT_CLINIC_OR_DEPARTMENT_OTHER): Payer: BLUE CROSS/BLUE SHIELD | Admitting: Internal Medicine

## 2018-10-20 ENCOUNTER — Inpatient Hospital Stay: Payer: BLUE CROSS/BLUE SHIELD | Attending: Internal Medicine

## 2018-10-20 VITALS — BP 129/89 | HR 75 | Temp 98.7°F | Resp 14 | Ht 69.0 in | Wt 146.5 lb

## 2018-10-20 DIAGNOSIS — C7800 Secondary malignant neoplasm of unspecified lung: Secondary | ICD-10-CM | POA: Insufficient documentation

## 2018-10-20 DIAGNOSIS — Z5112 Encounter for antineoplastic immunotherapy: Secondary | ICD-10-CM

## 2018-10-20 DIAGNOSIS — Z923 Personal history of irradiation: Secondary | ICD-10-CM

## 2018-10-20 DIAGNOSIS — Z79899 Other long term (current) drug therapy: Secondary | ICD-10-CM | POA: Insufficient documentation

## 2018-10-20 DIAGNOSIS — C7931 Secondary malignant neoplasm of brain: Secondary | ICD-10-CM

## 2018-10-20 DIAGNOSIS — C7802 Secondary malignant neoplasm of left lung: Secondary | ICD-10-CM | POA: Diagnosis not present

## 2018-10-20 DIAGNOSIS — I313 Pericardial effusion (noninflammatory): Secondary | ICD-10-CM

## 2018-10-20 DIAGNOSIS — C3432 Malignant neoplasm of lower lobe, left bronchus or lung: Secondary | ICD-10-CM

## 2018-10-20 DIAGNOSIS — R5382 Chronic fatigue, unspecified: Secondary | ICD-10-CM

## 2018-10-20 DIAGNOSIS — Z7189 Other specified counseling: Secondary | ICD-10-CM

## 2018-10-20 LAB — CMP (CANCER CENTER ONLY)
ALT: 22 U/L (ref 0–44)
AST: 30 U/L (ref 15–41)
Albumin: 4 g/dL (ref 3.5–5.0)
Alkaline Phosphatase: 100 U/L (ref 38–126)
Anion gap: 8 (ref 5–15)
BUN: 16 mg/dL (ref 6–20)
CO2: 25 mmol/L (ref 22–32)
Calcium: 9.4 mg/dL (ref 8.9–10.3)
Chloride: 107 mmol/L (ref 98–111)
Creatinine: 0.78 mg/dL (ref 0.44–1.00)
GFR, Est AFR Am: >60 mL/min (ref >60)
GFR, Estimated: >60 mL/min (ref >60)
Glucose, Bld: 85 mg/dL (ref 70–99)
Potassium: 4.5 mmol/L (ref 3.5–5.1)
Sodium: 140 mmol/L (ref 135–145)
Total Bilirubin: 0.3 mg/dL (ref 0.3–1.2)
Total Protein: 7.2 g/dL (ref 6.5–8.1)

## 2018-10-20 LAB — CBC WITH DIFFERENTIAL (CANCER CENTER ONLY)
Abs Immature Granulocytes: 0.01 10*3/uL (ref 0.00–0.07)
Basophils Absolute: 0 10*3/uL (ref 0.0–0.1)
Basophils Relative: 1 %
Eosinophils Absolute: 0 10*3/uL (ref 0.0–0.5)
Eosinophils Relative: 0 %
HCT: 39.2 % (ref 36.0–46.0)
Hemoglobin: 12.9 g/dL (ref 12.0–15.0)
Immature Granulocytes: 0 %
Lymphocytes Relative: 10 %
Lymphs Abs: 0.6 10*3/uL — ABNORMAL LOW (ref 0.7–4.0)
MCH: 28.8 pg (ref 26.0–34.0)
MCHC: 32.9 g/dL (ref 30.0–36.0)
MCV: 87.5 fL (ref 80.0–100.0)
Monocytes Absolute: 0.5 10*3/uL (ref 0.1–1.0)
Monocytes Relative: 10 %
Neutro Abs: 4.4 10*3/uL (ref 1.7–7.7)
Neutrophils Relative %: 79 %
Platelet Count: 295 10*3/uL (ref 150–400)
RBC: 4.48 MIL/uL (ref 3.87–5.11)
RDW: 14.6 % (ref 11.5–15.5)
WBC Count: 5.6 10*3/uL (ref 4.0–10.5)
nRBC: 0 % (ref 0.0–0.2)

## 2018-10-20 LAB — TSH: TSH: 2.693 u[IU]/mL (ref 0.308–3.960)

## 2018-10-20 NOTE — Progress Notes (Signed)
DISCONTINUE ON PATHWAY REGIMEN - Melanoma     A cycle is 21 days:     Pembrolizumab   **Always confirm dose/schedule in your pharmacy ordering system**  REASON: Other Reason PRIOR TREATMENT: [id number]: Pembrolizumab 200 mg q21 Days Until Progression or Unacceptable Toxicity TREATMENT RESPONSE: Partial Response (PR)  START ON PATHWAY REGIMEN - Melanoma   Nivolumab 1 mg/kg + Ipilimumab 3 mg/kg q21 Days x 4 Doses:   A cycle is every 21 days:     Nivolumab      Ipilimumab   **Always confirm dose/schedule in your pharmacy ordering system**  Nivolumab 240 mg q14 Days:   A cycle is every 14 days:     Nivolumab   **Always confirm dose/schedule in your pharmacy ordering system**  Patient Characteristics: Distant Metastases or Unresectable Local Recurrence, Unresectable, Symptomatic, First Line, BRAF V600 Wild Type / BRAF V600 Results Pending or Unknown, Candidate for Immunotherapy Disease Subtype: Unknown Current Disease Status: Distant Metastases BRAF V600 Mutation Status: BRAF V600 Wild Type (No Mutation) Metastatic Disease Type: Symptomatic Line of Therapy: First Line Immunotherapy Candidate Status: Candidate for Immunotherapy Intent of Therapy: Non-Curative / Palliative Intent, Discussed with Patient

## 2018-10-20 NOTE — Progress Notes (Signed)
[place] [phone]   [phone]  OFFICE PROGRESS NOTE  System, Pcp Not In No address on file  DIAGNOSIS: Metastatic high-grade neoplasm consistent with metastatic malignant melanoma based on the most recent pathology report from [facility] in October 2019, presented with large left left upper/left lower lobe mass with a hypermetabolic AP window lymph node as well as solitary metastatic brain lesion diagnosed in May 2019.  Biomarker Findings Microsatellite status - MS-Stable Tumor Mutational Burden - TMB-Intermediate (16 Muts/Mb) Genomic Findings For a complete list of the genes assayed, please refer to the Appendix. CD274 (PD-L1) amplification NRAS Q61R PDCD1LG2 (PD-L2) amplification MYC amplification EPHB1 amplification JAK2 amplification RB1 Q93*, J941* TERT promoter -146C>T TP53 C275W   PRIOR THERAPY: 1) left temporal craniotomy with resection of tumor with intraoperative stereotactic guidance for volumetric resection under the care of Dr. Annette Stable on 04/29/2018. 2) First-line treatment with immunotherapy with Keytruda 200 mg IV every 3 weeks.  First dose June 02, 2018.  Status post 3 cycles.  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 3) palliative radiotherapy to the left lung mass under the care of Dr. Lisbeth Renshaw completed on 08/24/2018. 4) Resuming her treatment again with Keytruda 200 mg IV every 3 weeks, first dose 09/08/2018.  Status post 2 cycles.  CURRENT THERAPY: Treatment with immunotherapy with ipilimumab 3 mg/KG and nivolumab 1 mg/KG every 3 weeks for the first 4 cycles followed by maintenance nivolumab 240 mg IV every 2 weeks.  First dose of this treatment October 26, 2018.  INTERVAL HISTORY: Leslie [name] 42 y.o. female returns to the clinic today for follow-up visit accompanied by her boyfriend.  The patient is feeling fine today with no concerning complaints.  She  tolerated the last 2 cycles of treatment with single agent Ketruda (pembrolizumab) fairly well.  She denied having any significant skin rash or diarrhea.  She has no nausea, vomiting, constipation or abdominal pain.  She has no chest pain, shortness of breath, cough or hemoptysis.  She denied having any headache or visual changes.  The patient has no recent weight loss or night sweats.  Her tissue block was reviewed at [facility] and they performed extensive molecular studies.  The final pathology report from [facility] was consistent with metastatic malignant melanoma, spindle cell type.  The recommendation from Dr. Emelda Brothers and the tumor board at [facility] is to switch her treatment with immunotherapy to the combination of ipilimumab and nivolumab.  The patient is here today for evaluation and discussion of this option.  MEDICAL HISTORY: Past Medical History:  Diagnosis Date  . Migraines   . Yeast infection     ALLERGIES:  has No Known Allergies.  MEDICATIONS:  Current Outpatient Medications  Medication Sig Dispense Refill  . Ascorbic Acid (LIQUID C 500) 500 MG/15ML LIQD Take 1,000 mg by mouth daily.    . Cholecalciferol 1000 UNIT/10ML LIQD Take by mouth.    . Doxylamine Succinate, Sleep, (UNISOM PO) Take by mouth.    Marland Kitchen GAMMA AMINOBUTYRIC ACID PO Take by mouth.    Marland Kitchen HCA CALCIUM-MAGNESIUM-ZINC PO Take by mouth. Taking one half tablespoon daily    . Melatonin 5 MG CAPS Take by mouth.    . Multiple Vitamin (MULTIVITAMIN) tablet Take 1 tablet by mouth daily.    . Omega 3-6-9 Fatty Acids (OMEGA 3-6-9 COMPLEX PO) Take 1 capsule by mouth daily.    Marland Kitchen OVER THE COUNTER MEDICATION     . prochlorperazine (  COMPAZINE) 10 MG tablet Take 1 tablet (10 mg total) by mouth every 6 (six) hours as needed for nausea or vomiting. (Patient not taking: Reported on 09/07/2018) 30 tablet 0   No current facility-administered medications for this visit.     SURGICAL HISTORY:    Past Surgical History:  Procedure Laterality Date  . APPLICATION OF CRANIAL NAVIGATION Left 04/29/2018   Procedure: APPLICATION OF CRANIAL NAVIGATION;  Surgeon: Earnie Larsson, MD;  Location: [place];  Service: Neurosurgery;  Laterality: Left;  . CRANIOTOMY Left 04/29/2018   Procedure: LEFT CRANIOTOMY FOR  TUMOR BRAIN LAB;  Surgeon: Earnie Larsson, MD;  Location: [place];  Service: Neurosurgery;  Laterality: Left;    REVIEW OF SYSTEMS:  Constitutional: negative Eyes: negative Ears, nose, mouth, throat, and face: negative Respiratory: negative Cardiovascular: negative Gastrointestinal: negative Genitourinary:negative Integument/breast: negative Hematologic/lymphatic: negative Musculoskeletal:negative Neurological: negative Behavioral/Psych: negative Endocrine: negative Allergic/Immunologic: negative   PHYSICAL EXAMINATION: General appearance: alert, cooperative and no distress Head: Normocephalic, without obvious abnormality, atraumatic Neck: no adenopathy, no JVD, supple, symmetrical, trachea midline and thyroid not enlarged, symmetric, no tenderness/mass/nodules Lymph nodes: Cervical, supraclavicular, and axillary nodes normal. Resp: clear to auscultation bilaterally Back: negative, symmetric, no curvature. ROM normal. No CVA tenderness. Cardio: regular rate and rhythm, S1, S2 normal, no murmur, click, rub or gallop GI: soft, non-tender; bowel sounds normal; no masses,  no organomegaly Extremities: extremities normal, atraumatic, no cyanosis or edema Neurologic: Alert and oriented X 3, normal strength and tone. Normal symmetric reflexes. Normal coordination and gait  ECOG PERFORMANCE STATUS: 0 - Asymptomatic  Blood pressure 129/89, pulse 75, temperature 98.7 F (37.1 C), temperature source Oral, resp. rate 14, height 5' 9"  (1.753 m), weight 146 lb 8 oz (66.5 kg), SpO2 100 %, unknown if currently breastfeeding.  LABORATORY DATA: Lab Results  Component Value Date   WBC 5.6 10/20/2018    HGB 12.9 10/20/2018   HCT 39.2 10/20/2018   MCV 87.5 10/20/2018   PLT 295 10/20/2018      Chemistry      Component Value Date/Time   NA 137 09/29/2018 1101   K 4.7 09/29/2018 1101   CL 105 09/29/2018 1101   CO2 24 09/29/2018 1101   BUN 15 09/29/2018 1101   CREATININE 0.75 09/29/2018 1101      Component Value Date/Time   CALCIUM 9.5 09/29/2018 1101   ALKPHOS 106 09/29/2018 1101   AST 32 09/29/2018 1101   ALT 24 09/29/2018 1101   BILITOT 0.3 09/29/2018 1101       RADIOGRAPHIC STUDIES: Ct Chest W Contrast  Result Date: 10/18/2018 CLINICAL DATA:  Metastatic melanoma, ongoing immunotherapy. EXAM: CT CHEST, ABDOMEN, AND PELVIS WITH CONTRAST TECHNIQUE: Multidetector CT imaging of the chest, abdomen and pelvis was performed following the standard protocol during bolus administration of intravenous contrast. CONTRAST:  137m OMNIPAQUE IOHEXOL 300 MG/ML  SOLN COMPARISON:  Multiple exams, including 08/01/2018 FINDINGS: CT CHEST FINDINGS Cardiovascular: Small pericardial effusion. The primarily lingular mass abuts and could be invading the pericardium. Mediastinum/Nodes: Prevascular node 0.8 cm in short axis on image 23/2, previously 0.9 cm. Lungs/Pleura: Increased degree of central necrosis in the lingular mass which measures 6.6 by 5.0 cm on image 46/2, formerly 8.0 by 5.6 cm. Sub solid left apical nodule 0.5 by 0.4 cm on image 40/6, stable. Sub solid nodularity medially in the left lower lobe measuring 0.8 by 0.5 cm on image 88/6 is new from the prior exam and quite likely from mild local atelectasis given the location. Musculoskeletal: Incidental partial  interbody fusion at C7-T1. CT ABDOMEN PELVIS FINDINGS Hepatobiliary: Unremarkable Pancreas: Unremarkable Spleen: Unremarkable Adrenals/Urinary Tract: Unremarkable Stomach/Bowel: Prominent stool throughout the colon favors constipation. At the site of prior jejuno-jejunal intussusception in the left upper quadrant there is a transition from  contrast filled to collapsed bowel, for example on image 54/4, but without current intussusception or definite bowel lesion appreciated. Vascular/Lymphatic: Unremarkable Reproductive: Unremarkable Other: Trace amount of free pelvic fluid, possibly physiologic. Musculoskeletal: Unremarkable IMPRESSION: 1. Interval significant reduction in the size of the lingular mass. This abuts and could be invading the pericardium, with a small pericardial effusion noted. 2. The previous trace left pleural effusion has resolved. 3. No current left upper quadrant intussusception although there is a transition between contrast filled and nondistended small bowel in this vicinity. 4. Stable 5 by 4 mm sub solid left apical nodule may merit surveillance. A 6 mm sub solid nodule in the left lower lobe is probably from transient atelectasis but may also warrant surveillance. 5.  Prominent stool throughout the colon favors constipation. Electronically Signed   By: Van Clines M.D.   On: 10/18/2018 08:55   Ct Abdomen Pelvis W Contrast  Result Date: 10/18/2018 CLINICAL DATA:  Metastatic melanoma, ongoing immunotherapy. EXAM: CT CHEST, ABDOMEN, AND PELVIS WITH CONTRAST TECHNIQUE: Multidetector CT imaging of the chest, abdomen and pelvis was performed following the standard protocol during bolus administration of intravenous contrast. CONTRAST:  189m OMNIPAQUE IOHEXOL 300 MG/ML  SOLN COMPARISON:  Multiple exams, including 08/01/2018 FINDINGS: CT CHEST FINDINGS Cardiovascular: Small pericardial effusion. The primarily lingular mass abuts and could be invading the pericardium. Mediastinum/Nodes: Prevascular node 0.8 cm in short axis on image 23/2, previously 0.9 cm. Lungs/Pleura: Increased degree of central necrosis in the lingular mass which measures 6.6 by 5.0 cm on image 46/2, formerly 8.0 by 5.6 cm. Sub solid left apical nodule 0.5 by 0.4 cm on image 40/6, stable. Sub solid nodularity medially in the left lower lobe measuring  0.8 by 0.5 cm on image 88/6 is new from the prior exam and quite likely from mild local atelectasis given the location. Musculoskeletal: Incidental partial interbody fusion at C7-T1. CT ABDOMEN PELVIS FINDINGS Hepatobiliary: Unremarkable Pancreas: Unremarkable Spleen: Unremarkable Adrenals/Urinary Tract: Unremarkable Stomach/Bowel: Prominent stool throughout the colon favors constipation. At the site of prior jejuno-jejunal intussusception in the left upper quadrant there is a transition from contrast filled to collapsed bowel, for example on image 54/4, but without current intussusception or definite bowel lesion appreciated. Vascular/Lymphatic: Unremarkable Reproductive: Unremarkable Other: Trace amount of free pelvic fluid, possibly physiologic. Musculoskeletal: Unremarkable IMPRESSION: 1. Interval significant reduction in the size of the lingular mass. This abuts and could be invading the pericardium, with a small pericardial effusion noted. 2. The previous trace left pleural effusion has resolved. 3. No current left upper quadrant intussusception although there is a transition between contrast filled and nondistended small bowel in this vicinity. 4. Stable 5 by 4 mm sub solid left apical nodule may merit surveillance. A 6 mm sub solid nodule in the left lower lobe is probably from transient atelectasis but may also warrant surveillance. 5.  Prominent stool throughout the colon favors constipation. Electronically Signed   By: WVan ClinesM.D.   On: 10/18/2018 08:55    ASSESSMENT AND PLAN: This is a very pleasant 42years old white female with highly suspicious metastatic malignant melanoma presented with large mass in the left upper/left lower lobe and mediastinal lymphadenopathy as well as solitary brain metastasis status post left temporal craniotomy and  resection of tumor on 04/29/2018 and she is recovering well from her surgery. The patient completed stereotactic radiotherapy to the resection  cavity next week under the care of Dr. Lisbeth Renshaw. The patient underwent treatment with immunotherapy with Keytruda 200 mg IV every 3 weeks status post 3 cycles.   Her scan after cycle #3 showed enlargement of the left upper lobe lung mass.  This was also suspicious for pseudo-progression on immunotherapy.  The patient was started on a palliative course of radiotherapy to the left upper lobe lung mass and she tolerated this treatment fairly well.  She was seen by Dr. Emelda Brothers at [facility] and she recommended for the patient to resume her treatment with [facility] for now until she undergoes further molecular studies.  The patient was treated with 2 more cycles of Keytruda and tolerated the treatment well. Her recent CT scan of the chest, abdomen and pelvis showed some improvement in the left upper lobe lung mass but there is still concern about pericardial invasion. I personally and independently reviewed the scan images and discussed the result and showed the images to the patient and her boyfriend today.  She is still not a good candidate for surgical resection because of the pericardial invasion. The final pathology report and recommendation from [facility] was consistent with metastatic melanoma and the recommendation is to switch the patient to a combination immunotherapy with Ipilumumab and nivolumab. I discussed with the patient this option of the treatment with the combination immunotherapy as well as the adverse effects.  The patient understands that there could be more adverse effect with the combination therapy especially with ipilimumab but she is willing to proceed with the treatment and take the risk of these adverse effect. She is expected to start the first cycle of this treatment next week once we received her insurance coverage. I will see the patient back for follow-up visit in 4 weeks for evaluation and management of any adverse effect of her treatment. She was advised  to call immediately if she has any concerning symptoms in the interval. The patient voices understanding of current disease status and treatment options and is in agreement with the current care plan. All questions were answered. The patient knows to call the clinic with any problems, questions or concerns. We can certainly see the patient much sooner if necessary. I spent 15 minutes counseling the patient face to face. The total time spent in the appointment was 25 minutes.   Disclaimer: This note was dictated with voice recognition software. Similar sounding words can inadvertently be transcribed and may not be corrected upon review.

## 2018-10-21 ENCOUNTER — Encounter: Payer: Self-pay | Admitting: Medical Oncology

## 2018-10-21 ENCOUNTER — Other Ambulatory Visit: Payer: Self-pay | Admitting: Medical Oncology

## 2018-10-25 ENCOUNTER — Telehealth: Payer: Self-pay | Admitting: Medical Oncology

## 2018-10-25 NOTE — Telephone Encounter (Signed)
I notified pt that tx has not been authorized. I told her do not come in tomorrow and I will update her tomorrow.

## 2018-10-25 NOTE — Telephone Encounter (Signed)
Tx authorized- Per Sharyn Lull ( at [facility]) , pt is authorized for opdivo [phone], from 10/26/18 through 04/23/2019) and yervoy ( [id number] from 10/26/18 through -02/14/19) .  Left voice message for pt to keep appt for tomorrow. Pharmacy notified. Pt confirmed appt for tomorrow.

## 2018-10-26 ENCOUNTER — Telehealth: Payer: Self-pay | Admitting: Internal Medicine

## 2018-10-26 ENCOUNTER — Inpatient Hospital Stay: Payer: BLUE CROSS/BLUE SHIELD

## 2018-10-26 ENCOUNTER — Other Ambulatory Visit: Payer: BLUE CROSS/BLUE SHIELD

## 2018-10-26 VITALS — BP 110/70 | HR 75 | Temp 98.1°F | Resp 18 | Ht 69.0 in | Wt 148.5 lb

## 2018-10-26 DIAGNOSIS — Z5112 Encounter for antineoplastic immunotherapy: Secondary | ICD-10-CM | POA: Diagnosis not present

## 2018-10-26 DIAGNOSIS — I313 Pericardial effusion (noninflammatory): Secondary | ICD-10-CM | POA: Diagnosis not present

## 2018-10-26 DIAGNOSIS — Z923 Personal history of irradiation: Secondary | ICD-10-CM | POA: Diagnosis not present

## 2018-10-26 DIAGNOSIS — C7931 Secondary malignant neoplasm of brain: Secondary | ICD-10-CM | POA: Diagnosis not present

## 2018-10-26 DIAGNOSIS — C7802 Secondary malignant neoplasm of left lung: Secondary | ICD-10-CM | POA: Diagnosis not present

## 2018-10-26 DIAGNOSIS — C3432 Malignant neoplasm of lower lobe, left bronchus or lung: Secondary | ICD-10-CM | POA: Diagnosis not present

## 2018-10-26 DIAGNOSIS — Z79899 Other long term (current) drug therapy: Secondary | ICD-10-CM | POA: Diagnosis not present

## 2018-10-26 MED ORDER — SODIUM CHLORIDE 0.9 % IV SOLN
Freq: Once | INTRAVENOUS | Status: AC
  Administered 2018-10-26: 09:00:00 via INTRAVENOUS
  Filled 2018-10-26: qty 250

## 2018-10-26 MED ORDER — DIPHENHYDRAMINE HCL 50 MG/ML IJ SOLN
INTRAMUSCULAR | Status: AC
  Filled 2018-10-26: qty 1

## 2018-10-26 MED ORDER — SODIUM CHLORIDE 0.9 % IV SOLN
20.0000 mg | Freq: Once | INTRAVENOUS | Status: AC
  Administered 2018-10-26: 20 mg via INTRAVENOUS
  Filled 2018-10-26: qty 2

## 2018-10-26 MED ORDER — SODIUM CHLORIDE 0.9 % IV SOLN
3.0000 mg/kg | Freq: Once | INTRAVENOUS | Status: AC
  Administered 2018-10-26: 200 mg via INTRAVENOUS
  Filled 2018-10-26: qty 40

## 2018-10-26 MED ORDER — FAMOTIDINE IN NACL 20-0.9 MG/50ML-% IV SOLN: 20.0000 mg | Freq: Once | INTRAVENOUS | Status: DC

## 2018-10-26 MED ORDER — DIPHENHYDRAMINE HCL 50 MG/ML IJ SOLN
25.0000 mg | Freq: Once | INTRAMUSCULAR | Status: AC
  Administered 2018-10-26: 25 mg via INTRAVENOUS

## 2018-10-26 MED ORDER — SODIUM CHLORIDE 0.9 % IV SOLN
1.0000 mg/kg | Freq: Once | INTRAVENOUS | Status: AC
  Administered 2018-10-26: 67 mg via INTRAVENOUS
  Filled 2018-10-26: qty 6.7

## 2018-10-26 NOTE — Telephone Encounter (Signed)
Scheduled appt per 11/07 los - gave patient calender in treatment area. Pt is aware of appts added.

## 2018-10-26 NOTE — Patient Instructions (Signed)
[facility] Discharge Instructions for Patients Receiving Chemotherapy  Today you received the following chemotherapy agents Nivolumab (OPDIVO) & Ipilimumab (YERVOY).  To help prevent nausea and vomiting after your treatment, we encourage you to take your nausea medication as prescribed.   If you develop nausea and vomiting that is not controlled by your nausea medication, call the clinic.   BELOW ARE SYMPTOMS THAT SHOULD BE REPORTED IMMEDIATELY:  *FEVER GREATER THAN 100.5 F  *CHILLS WITH OR WITHOUT FEVER  NAUSEA AND VOMITING THAT IS NOT CONTROLLED WITH YOUR NAUSEA MEDICATION  *UNUSUAL SHORTNESS OF BREATH  *UNUSUAL BRUISING OR BLEEDING  TENDERNESS IN MOUTH AND THROAT WITH OR WITHOUT PRESENCE OF ULCERS  *URINARY PROBLEMS  *BOWEL PROBLEMS  UNUSUAL RASH Items with * indicate a potential emergency and should be followed up as soon as possible.  Feel free to call the clinic should you have any questions or concerns. The clinic phone number is [phone].  Please show the [place] at check-in to the Emergency Department and triage nurse.  Nivolumab injection What is this medicine? NIVOLUMAB (nye VOL ue mab) is a monoclonal antibody. It is used to treat melanoma, lung cancer, kidney cancer, head and neck cancer, Hodgkin lymphoma, urothelial cancer, colon cancer, and liver cancer. This medicine may be used for other purposes; ask your health care provider or pharmacist if you have questions. COMMON BRAND NAME(S): Opdivo What should I tell my health care provider before I take this medicine? They need to know if you have any of these conditions: -diabetes -immune system problems -kidney disease -liver disease -lung disease -organ transplant -stomach or intestine problems -thyroid disease -an unusual or allergic reaction to nivolumab, other medicines, foods, dyes, or preservatives -pregnant or trying to get pregnant -breast-feeding How should I use  this medicine? This medicine is for infusion into a vein. It is given by a health care professional in a hospital or clinic setting. A special MedGuide will be given to you before each treatment. Be sure to read this information carefully each time. Talk to your pediatrician regarding the use of this medicine in children. While this drug may be prescribed for children as young as 12 years for selected conditions, precautions do apply. Overdosage: If you think you have taken too much of this medicine contact a poison control center or emergency room at once. NOTE: This medicine is only for you. Do not share this medicine with others. What if I miss a dose? It is important not to miss your dose. Call your doctor or health care professional if you are unable to keep an appointment. What may interact with this medicine? Interactions have not been studied. Give your health care provider a list of all the medicines, herbs, non-prescription drugs, or dietary supplements you use. Also tell them if you smoke, drink alcohol, or use illegal drugs. Some items may interact with your medicine. This list may not describe all possible interactions. Give your health care provider a list of all the medicines, herbs, non-prescription drugs, or dietary supplements you use. Also tell them if you smoke, drink alcohol, or use illegal drugs. Some items may interact with your medicine. What should I watch for while using this medicine? This drug may make you feel generally unwell. Continue your course of treatment even though you feel ill unless your doctor tells you to stop. You may need blood work done while you are taking this medicine. Do not become pregnant while taking this medicine or for  5 months after stopping it. Women should inform their doctor if they wish to become pregnant or think they might be pregnant. There is a potential for serious side effects to an unborn child. Talk to your health care professional or  pharmacist for more information. Do not breast-feed an infant while taking this medicine. What side effects may I notice from receiving this medicine? Side effects that you should report to your doctor or health care professional as soon as possible: -allergic reactions like skin rash, itching or hives, swelling of the face, lips, or tongue -black, tarry stools -blood in the urine -bloody or watery diarrhea -changes in vision -change in sex drive -changes in emotions or moods -chest pain -confusion -cough -decreased appetite -diarrhea -facial flushing -feeling faint or lightheaded -fever, chills -hair loss -hallucination, loss of contact with reality -headache -irritable -joint pain -loss of memory -muscle pain -muscle weakness -seizures -shortness of breath -signs and symptoms of high blood sugar such as dizziness; dry mouth; dry skin; fruity breath; nausea; stomach pain; increased hunger or thirst; increased urination -signs and symptoms of kidney injury like trouble passing urine or change in the amount of urine -signs and symptoms of liver injury like dark yellow or brown urine; general ill feeling or flu-like symptoms; light-colored stools; loss of appetite; nausea; right upper belly pain; unusually weak or tired; yellowing of the eyes or skin -stiff neck -swelling of the ankles, feet, hands -weight gain Side effects that usually do not require medical attention (report to your doctor or health care professional if they continue or are bothersome): -bone pain -constipation -tiredness -vomiting This list may not describe all possible side effects. Call your doctor for medical advice about side effects. You may report side effects to FDA at 1-800-FDA-1088. Where should I keep my medicine? This drug is given in a hospital or clinic and will not be stored at home. NOTE: This sheet is a summary. It may not cover all possible information. If you have questions about this  medicine, talk to your doctor, pharmacist, or health care provider.  2018 Elsevier/Gold Standard (2016-09-07 17:49:34)  Ipilimumab injection What is this medicine? IPILIMUMAB (IP i LIM ue mab) is a monoclonal antibody. It is used to treat melanoma, a type of skin cancer. This medicine may be used for other purposes; ask your health care provider or pharmacist if you have questions. COMMON BRAND NAME(S): YERVOY What should I tell my health care provider before I take this medicine? They need to know if you have any of these conditions: -Addison's disease -blood in your stools (black or tarry stools) or if you have blood in your vomit -eye disease, vision problems -history of pancreatitis -history of stomach bleeding -immune system problems -inflammatory bowel disease -kidney disease -liver disease -lupus -myasthenia gravis -organ transplant -rheumatoid arthritis -sarcoidosis -stomach or intestine problems -thyroid disease -tingling of the fingers or toes, or other nerve disorder -an unusual or allergic reaction to ipilimumab, other medicines, foods, dyes, or preservatives -pregnant or trying to get pregnant -breast-feeding How should I use this medicine? This medicine is for infusion into a vein. It is given by a health care professional in a hospital or clinic setting. A special MedGuide will be given to you before each treatment. Be sure to read this information carefully each time. Talk to your pediatrician regarding the use of this medicine in children. While this drug may be prescribed for children as young as 12 years for selected conditions, precautions do apply. Overdosage: If  you think you have taken too much of this medicine contact a poison control center or emergency room at once. NOTE: This medicine is only for you. Do not share this medicine with others. What if I miss a dose? It is important not to miss your dose. Call your doctor or health care professional if you  are unable to keep an appointment. What may interact with this medicine? Interactions are not expected. This list may not describe all possible interactions. Give your health care provider a list of all the medicines, herbs, non-prescription drugs, or dietary supplements you use. Also tell them if you smoke, drink alcohol, or use illegal drugs. Some items may interact with your medicine. What should I watch for while using this medicine? Tell your doctor or healthcare professional if your symptoms do not start to get better or if they get worse. Do not become pregnant while taking this medicine or for 3 months after stopping it. Women should inform their doctor if they wish to become pregnant or think they might be pregnant. There is a potential for serious side effects to an unborn child. Talk to your health care professional or pharmacist for more information. Do not breast-feed an infant while taking this medicine or for 3 months after the last dose. Your condition will be monitored carefully while you are receiving this medicine. You may need blood work done while you are taking this medicine. What side effects may I notice from receiving this medicine? Side effects that you should report to your doctor or health care professional as soon as possible: -allergic reactions like skin rash, itching or hives, swelling of the face, lips, or tongue -black, tarry stools -bloody or watery diarrhea -changes in vision -dizziness -eye pain -fast, irregular heartbeat -feeling anxious -feeling faint or lightheaded, falls -nausea, vomiting -pain, tingling, numbness in the hands or feet -redness, blistering, peeling or loosening of the skin, including inside the mouth -signs and symptoms of liver injury like dark yellow or brown urine; general ill feeling or flu-like symptoms; light-colored stools; loss of appetite; nausea; right upper belly pain; unusually weak or tired; yellowing of the eyes or  skin -unusual bleeding or bruising Side effects that usually do not require medical attention (report to your doctor or health care professional if they continue or are bothersome): -headache -loss of appetite -trouble sleeping This list may not describe all possible side effects. Call your doctor for medical advice about side effects. You may report side effects to FDA at 1-800-FDA-1088. Where should I keep my medicine? This drug is given in a hospital or clinic and will not be stored at home. NOTE: This sheet is a summary. It may not cover all possible information. If you have questions about this medicine, talk to your doctor, pharmacist, or health care provider.  2018 Elsevier/Gold Standard (2016-07-07 11:41:46)

## 2018-11-16 ENCOUNTER — Other Ambulatory Visit: Payer: BLUE CROSS/BLUE SHIELD

## 2018-11-16 ENCOUNTER — Ambulatory Visit: Payer: BLUE CROSS/BLUE SHIELD

## 2018-11-16 ENCOUNTER — Ambulatory Visit: Payer: BLUE CROSS/BLUE SHIELD | Admitting: Nurse Practitioner

## 2018-11-17 ENCOUNTER — Encounter: Payer: Self-pay | Admitting: Oncology

## 2018-11-17 ENCOUNTER — Inpatient Hospital Stay: Payer: BLUE CROSS/BLUE SHIELD | Attending: Internal Medicine

## 2018-11-17 ENCOUNTER — Inpatient Hospital Stay: Payer: BLUE CROSS/BLUE SHIELD

## 2018-11-17 ENCOUNTER — Other Ambulatory Visit: Payer: Self-pay | Admitting: *Deleted

## 2018-11-17 ENCOUNTER — Inpatient Hospital Stay (HOSPITAL_BASED_OUTPATIENT_CLINIC_OR_DEPARTMENT_OTHER): Payer: BLUE CROSS/BLUE SHIELD | Admitting: Oncology

## 2018-11-17 VITALS — BP 117/73 | HR 78 | Temp 98.1°F | Resp 18 | Ht 69.0 in | Wt 147.3 lb

## 2018-11-17 DIAGNOSIS — Z923 Personal history of irradiation: Secondary | ICD-10-CM | POA: Insufficient documentation

## 2018-11-17 DIAGNOSIS — C7931 Secondary malignant neoplasm of brain: Secondary | ICD-10-CM | POA: Diagnosis not present

## 2018-11-17 DIAGNOSIS — C7802 Secondary malignant neoplasm of left lung: Secondary | ICD-10-CM

## 2018-11-17 DIAGNOSIS — Z79899 Other long term (current) drug therapy: Secondary | ICD-10-CM | POA: Diagnosis not present

## 2018-11-17 DIAGNOSIS — Z5112 Encounter for antineoplastic immunotherapy: Secondary | ICD-10-CM | POA: Diagnosis not present

## 2018-11-17 DIAGNOSIS — C3432 Malignant neoplasm of lower lobe, left bronchus or lung: Secondary | ICD-10-CM | POA: Diagnosis not present

## 2018-11-17 DIAGNOSIS — R5382 Chronic fatigue, unspecified: Secondary | ICD-10-CM

## 2018-11-17 LAB — CBC WITH DIFFERENTIAL (CANCER CENTER ONLY)
Abs Immature Granulocytes: 0.01 10*3/uL (ref 0.00–0.07)
Basophils Absolute: 0.1 10*3/uL (ref 0.0–0.1)
Basophils Relative: 1 %
Eosinophils Absolute: 0.1 10*3/uL (ref 0.0–0.5)
Eosinophils Relative: 2 %
HCT: 40 % (ref 36.0–46.0)
Hemoglobin: 13.1 g/dL (ref 12.0–15.0)
Immature Granulocytes: 0 %
Lymphocytes Relative: 8 %
Lymphs Abs: 0.7 10*3/uL (ref 0.7–4.0)
MCH: 29.4 pg (ref 26.0–34.0)
MCHC: 32.8 g/dL (ref 30.0–36.0)
MCV: 89.7 fL (ref 80.0–100.0)
Monocytes Absolute: 0.7 10*3/uL (ref 0.1–1.0)
Monocytes Relative: 8 %
Neutro Abs: 7.5 10*3/uL (ref 1.7–7.7)
Neutrophils Relative %: 81 %
Platelet Count: 300 10*3/uL (ref 150–400)
RBC: 4.46 MIL/uL (ref 3.87–5.11)
RDW: 14.4 % (ref 11.5–15.5)
WBC Count: 9.2 10*3/uL (ref 4.0–10.5)
nRBC: 0 % (ref 0.0–0.2)

## 2018-11-17 LAB — CMP (CANCER CENTER ONLY)
ALT: 24 U/L (ref 0–44)
AST: 33 U/L (ref 15–41)
Albumin: 4 g/dL (ref 3.5–5.0)
Alkaline Phosphatase: 92 U/L (ref 38–126)
Anion gap: 7 (ref 5–15)
BUN: 16 mg/dL (ref 6–20)
CO2: 26 mmol/L (ref 22–32)
Calcium: 9.6 mg/dL (ref 8.9–10.3)
Chloride: 107 mmol/L (ref 98–111)
Creatinine: 0.88 mg/dL (ref 0.44–1.00)
GFR, Est AFR Am: >60 mL/min (ref >60)
GFR, Estimated: >60 mL/min (ref >60)
Glucose, Bld: 84 mg/dL (ref 70–99)
Potassium: 5 mmol/L (ref 3.5–5.1)
Sodium: 140 mmol/L (ref 135–145)
Total Bilirubin: 0.3 mg/dL (ref 0.3–1.2)
Total Protein: 7.2 g/dL (ref 6.5–8.1)

## 2018-11-17 LAB — TSH: TSH: 1.699 u[IU]/mL (ref 0.308–3.960)

## 2018-11-17 MED ORDER — SODIUM CHLORIDE 0.9 % IV SOLN
Freq: Once | INTRAVENOUS | Status: AC
  Administered 2018-11-17: 14:00:00 via INTRAVENOUS
  Filled 2018-11-17: qty 250

## 2018-11-17 MED ORDER — SODIUM CHLORIDE 0.9 % IV SOLN
1.0000 mg/kg | Freq: Once | INTRAVENOUS | Status: AC
  Administered 2018-11-17: 67 mg via INTRAVENOUS
  Filled 2018-11-17: qty 6.7

## 2018-11-17 MED ORDER — FAMOTIDINE IN NACL 20-0.9 MG/50ML-% IV SOLN
INTRAVENOUS | Status: AC
  Filled 2018-11-17: qty 50

## 2018-11-17 MED ORDER — DIPHENHYDRAMINE HCL 50 MG/ML IJ SOLN
INTRAMUSCULAR | Status: AC
  Filled 2018-11-17: qty 1

## 2018-11-17 MED ORDER — FAMOTIDINE IN NACL 20-0.9 MG/50ML-% IV SOLN
20.0000 mg | Freq: Once | INTRAVENOUS | Status: AC
  Administered 2018-11-17: 20 mg via INTRAVENOUS

## 2018-11-17 MED ORDER — DIPHENHYDRAMINE HCL 50 MG/ML IJ SOLN
25.0000 mg | Freq: Once | INTRAMUSCULAR | Status: AC
  Administered 2018-11-17: 25 mg via INTRAVENOUS

## 2018-11-17 MED ORDER — SODIUM CHLORIDE 0.9 % IV SOLN
3.0000 mg/kg | Freq: Once | INTRAVENOUS | Status: AC
  Administered 2018-11-17: 200 mg via INTRAVENOUS
  Filled 2018-11-17: qty 40

## 2018-11-17 NOTE — Assessment & Plan Note (Signed)
This is a very pleasant 42 year old white female with highly suspicious metastatic malignant melanoma presented with large mass in the left upper/left lower lobe and mediastinal lymphadenopathy as well as solitary brain metastasis status post left temporal craniotomy and resection of tumor on 04/29/2018 and covered well from her surgery. The patient completed stereotactic radiotherapy to the resection cavity next week under the care of Dr. Lisbeth Renshaw. The patient underwent treatment with immunotherapy with Keytruda 200 mg IV every 3 weeks status post 3 cycles.   Her scan after cycle #3 showed enlargement of the left upper lobe lung mass. This was also suspicious for pseudo-progression on immunotherapy.  The patient was started on a palliative course of radiotherapy to the left upper lobe lung mass and she tolerated this treatment fairly well.  She was seen by Dr. Emelda Brothers at [facility] and she recommended for the patient to resume her treatment with [facility] until she undergoes further molecular studies.  The patient was treated with 2 more cycles of Keytruda and tolerated the treatment well.  The patient started treatment with ipilimumab and nivolumab per the recommendation of [employer].  She is status post 1 cycle of this treatment which she tolerated fairly well with the exception of mild rash and itching.  This is now resolved.  Labs from today been reviewed.  Recommend for her to proceed with cycle #2 of her treatment today as scheduled.  She will follow-up in 3 weeks for evaluation prior to cycle #3.  She was advised to call immediately if she has any concerning symptoms in the interval. The patient voices understanding of current disease status and treatment options and is in agreement with the current care plan. All questions were answered. The patient knows to call the clinic with any problems, questions or concerns. We can certainly see the patient much sooner if necessary.

## 2018-11-17 NOTE — Progress Notes (Signed)
[facility] OFFICE PROGRESS NOTE  System, Pcp Not In No address on file  DIAGNOSIS: Metastatic high-grade neoplasm consistent with metastatic malignant melanoma based on the most recent pathology report from [facility] in October 2019, presented with large left left upper/left lower lobe mass with a hypermetabolic AP window lymph node as well as solitary metastatic brain lesion diagnosed in May 2019.  Biomarker Findings Microsatellite status - MS-Stable Tumor Mutational Burden - TMB-Intermediate (16 Muts/Mb) Genomic Findings For a complete list of the genes assayed, please refer to the Appendix. CD274 (PD-L1) amplification NRAS Q61R PDCD1LG2 (PD-L2) amplification MYC amplification EPHB1 amplification JAK2 amplification RB1 Q93*, H607* TERT promoter -146C>T TP53 C275W   PRIOR THERAPY: 1) left temporal craniotomy with resection of tumor with intraoperative stereotactic guidance for volumetric resection under the care of Dr. Annette Stable on 04/29/2018. 2) First-line treatment with immunotherapy with Keytruda 200 mg IV every 3 weeks.  First dose June 02, 2018.  Status post 3 cycles.  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 3) palliative radiotherapy to the left lung mass under the care of Dr. Lisbeth Renshaw completed on 08/24/2018. 4) Resuming her treatment again with Keytruda 200 mg IV every 3 weeks, first dose 09/08/2018.  Status post 2 cycles.  CURRENT THERAPY: Treatment with immunotherapy with ipilimumab 3 mg/KG and nivolumab 1 mg/KG every 3 weeks for the first 4 cycles followed by maintenance nivolumab 240 mg IV every 2 weeks.  First dose of this treatment October 26, 2018.  Status post 1 cycle.  INTERVAL HISTORY: Leslie [name] 42 y.o. female returns for a routine follow-up visit accompanied by her boyfriend.  The patient is feeling fine today and has no specific complaints.  She reports that she tolerated her first cycle  of Mab and nivolumab fairly well with the exception of mild rash and itching.  This lasted for only a few days and she used topical treatment for this.  This has now since resolved.  Denies fevers and chills.  Denies chest pain, shortness of breath, cough, hemoptysis.,  Vomiting, constipation, diarrhea.  Denies recent weight loss or night sweats.  The patient is here for evaluation prior to cycle #2 of her treatment.  MEDICAL HISTORY: Past Medical History:  Diagnosis Date  . Migraines   . Yeast infection     ALLERGIES:  has No Known Allergies.  MEDICATIONS:  Current Outpatient Medications  Medication Sig Dispense Refill  . Ascorbic Acid (LIQUID C 500) 500 MG/15ML LIQD Take 1,000 mg by mouth daily.    . Cholecalciferol 1000 UNIT/10ML LIQD Take by mouth.    . Doxylamine Succinate, Sleep, (UNISOM PO) Take by mouth.    Marland Kitchen GAMMA AMINOBUTYRIC ACID PO Take by mouth.    Marland Kitchen HCA CALCIUM-MAGNESIUM-ZINC PO Take by mouth. Taking one half tablespoon daily    . Melatonin 5 MG CAPS Take by mouth.    . Multiple Vitamin (MULTIVITAMIN) tablet Take 1 tablet by mouth daily.    . Omega 3-6-9 Fatty Acids (OMEGA 3-6-9 COMPLEX PO) Take 1 capsule by mouth daily.    Marland Kitchen OVER THE COUNTER MEDICATION     . prochlorperazine (COMPAZINE) 10 MG tablet Take 1 tablet (10 mg total) by mouth every 6 (six) hours as needed for nausea or vomiting. (Patient not taking: Reported on 09/07/2018) 30 tablet 0   No current facility-administered medications for this visit.    Facility-Administered Medications Ordered in Other Visits  Medication Dose Route Frequency Provider Last Rate Last Dose  . famotidine (PEPCID)  IVPB 20 mg premix  20 mg Intravenous Once Curt Bears, MD      . ipilimumab (YERVOY) 200 mg in sodium chloride 0.9 % 100 mL chemo infusion  3 mg/kg (Treatment Plan Recorded) Intravenous Once Curt Bears, MD      . nivolumab (OPDIVO) 67 mg in sodium chloride 0.9 % 50 mL chemo infusion  1 mg/kg (Treatment Plan  Recorded) Intravenous Once Curt Bears, MD        SURGICAL HISTORY:  Past Surgical History:  Procedure Laterality Date  . APPLICATION OF CRANIAL NAVIGATION Left 04/29/2018   Procedure: APPLICATION OF CRANIAL NAVIGATION;  Surgeon: Earnie Larsson, MD;  Location: [facility];  Service: Neurosurgery;  Laterality: Left;  . CRANIOTOMY Left 04/29/2018   Procedure: LEFT CRANIOTOMY FOR  TUMOR BRAIN LAB;  Surgeon: Earnie Larsson, MD;  Location: [facility] [facility];  Service: Neurosurgery;  Laterality: Left;    REVIEW OF SYSTEMS:   Review of Systems  Constitutional: Negative for appetite change, chills, fatigue, fever and unexpected weight change.  HENT:   Negative for mouth sores, nosebleeds, sore throat and trouble swallowing.   Eyes: Negative for eye problems and icterus.  Respiratory: Negative for cough, hemoptysis, shortness of breath and wheezing.   Cardiovascular: Negative for chest pain and leg swelling.  Gastrointestinal: Negative for abdominal pain, constipation, diarrhea, nausea and vomiting.  Genitourinary: Negative for bladder incontinence, difficulty urinating, dysuria, frequency and hematuria.   Musculoskeletal: Negative for back pain, gait problem, neck pain and neck stiffness.  Skin: Negative for itching and rash.  Neurological: Negative for dizziness, extremity weakness, gait problem, headaches, light-headedness and seizures.  Hematological: Negative for adenopathy. Does not bruise/bleed easily.  Psychiatric/Behavioral: Negative for confusion, depression and sleep disturbance. The patient is not nervous/anxious.     PHYSICAL EXAMINATION:  Blood pressure 117/73, pulse 78, temperature 98.1 F (36.7 C), temperature source Oral, resp. rate 18, height 5' 9"  (1.753 m), weight 147 lb 4.8 oz (66.8 kg), SpO2 99 %, unknown if currently breastfeeding.  ECOG PERFORMANCE STATUS: 0 - Asymptomatic  Physical Exam  Constitutional: Oriented to person, place, and time and well-developed, well-nourished, and in no  distress. No distress.  HENT:  Head: Normocephalic and atraumatic.  Mouth/Throat: Oropharynx is clear and moist. No oropharyngeal exudate.  Eyes: Conjunctivae are normal. Right eye exhibits no discharge. Left eye exhibits no discharge. No scleral icterus.  Neck: Normal range of motion. Neck supple.  Cardiovascular: Normal rate, regular rhythm, normal heart sounds and intact distal pulses.   Pulmonary/Chest: Effort normal and breath sounds normal. No respiratory distress. No wheezes. No rales.  Abdominal: Soft. Bowel sounds are normal. Exhibits no distension and no mass. There is no tenderness.  Musculoskeletal: Normal range of motion. Exhibits no edema.  Lymphadenopathy:    No cervical adenopathy.  Neurological: Alert and oriented to person, place, and time. Exhibits normal muscle tone. Gait normal. Coordination normal.  Skin: Skin is warm and dry. No rash noted. Not diaphoretic. No erythema. No pallor.  Psychiatric: Mood, memory and judgment normal.  Vitals reviewed.  LABORATORY DATA: Lab Results  Component Value Date   WBC 9.2 11/17/2018   HGB 13.1 11/17/2018   HCT 40.0 11/17/2018   MCV 89.7 11/17/2018   PLT 300 11/17/2018      Chemistry      Component Value Date/Time   NA 140 11/17/2018 1246   K 5.0 11/17/2018 1246   CL 107 11/17/2018 1246   CO2 26 11/17/2018 1246   BUN 16 11/17/2018 1246   CREATININE 0.88  11/17/2018 1246      Component Value Date/Time   CALCIUM 9.6 11/17/2018 1246   ALKPHOS 92 11/17/2018 1246   AST 33 11/17/2018 1246   ALT 24 11/17/2018 1246   BILITOT 0.3 11/17/2018 1246       RADIOGRAPHIC STUDIES:  No results found.   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 The patient completed stereotactic radiotherapy to the resection cavity next week under the care of Dr. Lisbeth Renshaw. The patient underwent treatment with immunotherapy with Keytruda 200 mg IV every 3 weeks status post 3 cycles.   Her scan after cycle #3 showed enlargement of the left upper lobe lung mass. This was also suspicious for pseudo-progression on immunotherapy.  The patient was started on a palliative course of radiotherapy to the left upper lobe lung mass and she tolerated this treatment fairly well.  She was seen by Dr. Emelda Brothers at [facility] and she recommended for the patient to resume her treatment with [facility] until she undergoes further molecular studies.  The patient was treated with 2 more cycles of Keytruda and tolerated the treatment well.  The patient started treatment with ipilimumab and nivolumab per the recommendation of [employer].  She is status post 1 cycle of this treatment which she tolerated fairly well with the exception of mild rash and itching.  This is now resolved.  Labs from today been reviewed.  Recommend for her to proceed with cycle #2 of her treatment today as scheduled.  She will follow-up in 3 weeks for evaluation prior to cycle #3.  She was advised to call immediately if she has any concerning symptoms in the interval. The patient voices understanding of current disease status and treatment options and is in agreement with the current care plan. All questions were answered. The patient knows to call the clinic with any problems, questions or concerns. We can certainly see the patient much sooner if necessary.   No orders of the defined types were placed in this encounter.    Mikey Bussing, DNP, AGPCNP-BC, AOCNP 11/17/18

## 2018-11-17 NOTE — Patient Instructions (Signed)
[facility] Discharge Instructions for Patients Receiving Chemotherapy  Today you received the following chemotherapy agents nivolumab and yervoy  To help prevent nausea and vomiting after your treatment, we encourage you to take your nausea medication as directed   If you develop nausea and vomiting that is not controlled by your nausea medication, call the clinic.   BELOW ARE SYMPTOMS THAT SHOULD BE REPORTED IMMEDIATELY:  *FEVER GREATER THAN 100.5 F  *CHILLS WITH OR WITHOUT FEVER  NAUSEA AND VOMITING THAT IS NOT CONTROLLED WITH YOUR NAUSEA MEDICATION  *UNUSUAL SHORTNESS OF BREATH  *UNUSUAL BRUISING OR BLEEDING  TENDERNESS IN MOUTH AND THROAT WITH OR WITHOUT PRESENCE OF ULCERS  *URINARY PROBLEMS  *BOWEL PROBLEMS  UNUSUAL RASH Items with * indicate a potential emergency and should be followed up as soon as possible.  Feel free to call the clinic should you have any questions or concerns. The clinic phone number is [phone].  Please show the [place] at check-in to the Emergency Department and triage nurse.

## 2018-11-18 ENCOUNTER — Telehealth: Payer: Self-pay | Admitting: Oncology

## 2018-11-18 NOTE — Telephone Encounter (Signed)
Scheduled appt per 12/5 los - pt to get an update schedule next visit .

## 2018-11-28 DIAGNOSIS — M2560 Stiffness of unspecified joint, not elsewhere classified: Secondary | ICD-10-CM | POA: Diagnosis not present

## 2018-11-28 DIAGNOSIS — M9901 Segmental and somatic dysfunction of cervical region: Secondary | ICD-10-CM | POA: Diagnosis not present

## 2018-11-28 DIAGNOSIS — M62838 Other muscle spasm: Secondary | ICD-10-CM | POA: Diagnosis not present

## 2018-11-30 ENCOUNTER — Encounter: Payer: Self-pay | Admitting: Medical Oncology

## 2018-12-01 NOTE — Progress Notes (Signed)
[place] requesting Attending Physician Statement from 09/15/18 be resent. Successfully faxed paperwork to [facility] at [phone].

## 2018-12-02 ENCOUNTER — Telehealth: Payer: Self-pay | Admitting: *Deleted

## 2018-12-02 NOTE — Telephone Encounter (Signed)
Received voice mail message to return call to patient-no reason for call specified.  Attempted call back but had to leave vm message for pt to call back before 4pm or on Monday.

## 2018-12-06 ENCOUNTER — Inpatient Hospital Stay: Payer: BLUE CROSS/BLUE SHIELD

## 2018-12-06 ENCOUNTER — Other Ambulatory Visit: Payer: Self-pay | Admitting: Internal Medicine

## 2018-12-06 ENCOUNTER — Encounter: Payer: Self-pay | Admitting: Internal Medicine

## 2018-12-06 ENCOUNTER — Inpatient Hospital Stay (HOSPITAL_BASED_OUTPATIENT_CLINIC_OR_DEPARTMENT_OTHER): Payer: BLUE CROSS/BLUE SHIELD | Admitting: Internal Medicine

## 2018-12-06 VITALS — BP 118/70 | HR 78 | Temp 98.7°F | Resp 18 | Ht 69.0 in | Wt 146.5 lb

## 2018-12-06 DIAGNOSIS — Z79899 Other long term (current) drug therapy: Secondary | ICD-10-CM | POA: Diagnosis not present

## 2018-12-06 DIAGNOSIS — C3432 Malignant neoplasm of lower lobe, left bronchus or lung: Secondary | ICD-10-CM | POA: Diagnosis not present

## 2018-12-06 DIAGNOSIS — Z923 Personal history of irradiation: Secondary | ICD-10-CM | POA: Diagnosis not present

## 2018-12-06 DIAGNOSIS — R5382 Chronic fatigue, unspecified: Secondary | ICD-10-CM

## 2018-12-06 DIAGNOSIS — Z5112 Encounter for antineoplastic immunotherapy: Secondary | ICD-10-CM | POA: Diagnosis not present

## 2018-12-06 DIAGNOSIS — C7931 Secondary malignant neoplasm of brain: Secondary | ICD-10-CM

## 2018-12-06 DIAGNOSIS — C7802 Secondary malignant neoplasm of left lung: Secondary | ICD-10-CM | POA: Diagnosis not present

## 2018-12-06 LAB — CBC WITH DIFFERENTIAL (CANCER CENTER ONLY)
Abs Immature Granulocytes: 0.02 10*3/uL (ref 0.00–0.07)
Basophils Absolute: 0.1 10*3/uL (ref 0.0–0.1)
Basophils Relative: 1 %
Eosinophils Absolute: 0.2 10*3/uL (ref 0.0–0.5)
Eosinophils Relative: 3 %
HCT: 39.8 % (ref 36.0–46.0)
Hemoglobin: 13.3 g/dL (ref 12.0–15.0)
Immature Granulocytes: 0 %
Lymphocytes Relative: 10 %
Lymphs Abs: 0.7 10*3/uL (ref 0.7–4.0)
MCH: 29.4 pg (ref 26.0–34.0)
MCHC: 33.4 g/dL (ref 30.0–36.0)
MCV: 87.9 fL (ref 80.0–100.0)
Monocytes Absolute: 0.8 10*3/uL (ref 0.1–1.0)
Monocytes Relative: 10 %
Neutro Abs: 5.8 10*3/uL (ref 1.7–7.7)
Neutrophils Relative %: 76 %
Platelet Count: 277 10*3/uL (ref 150–400)
RBC: 4.53 MIL/uL (ref 3.87–5.11)
RDW: 14 % (ref 11.5–15.5)
WBC Count: 7.6 10*3/uL (ref 4.0–10.5)
nRBC: 0 % (ref 0.0–0.2)

## 2018-12-06 LAB — CMP (CANCER CENTER ONLY)
ALT: 1001 U/L (ref 0–44)
AST: 674 U/L (ref 15–41)
Albumin: 3.9 g/dL (ref 3.5–5.0)
Alkaline Phosphatase: 108 U/L (ref 38–126)
Anion gap: 8 (ref 5–15)
BUN: 12 mg/dL (ref 6–20)
CO2: 23 mmol/L (ref 22–32)
Calcium: 9.3 mg/dL (ref 8.9–10.3)
Chloride: 108 mmol/L (ref 98–111)
Creatinine: 0.82 mg/dL (ref 0.44–1.00)
GFR, Est AFR Am: >60 mL/min (ref >60)
GFR, Estimated: >60 mL/min (ref >60)
Glucose, Bld: 77 mg/dL (ref 70–99)
Potassium: 4.5 mmol/L (ref 3.5–5.1)
Sodium: 139 mmol/L (ref 135–145)
Total Bilirubin: 0.5 mg/dL (ref 0.3–1.2)
Total Protein: 7.1 g/dL (ref 6.5–8.1)

## 2018-12-06 LAB — TSH: TSH: 2.357 u[IU]/mL (ref 0.308–3.960)

## 2018-12-06 MED ORDER — PREDNISONE 20 MG PO TABS: ORAL_TABLET | ORAL | 0 refills | Status: DC

## 2018-12-06 NOTE — Progress Notes (Signed)
[place] Telephone:([phone]) [phone]   Fax:([phone]) [phone]  OFFICE PROGRESS NOTE  System, Pcp Not In No address on file  DIAGNOSIS: Metastatic high-grade neoplasm consistent with metastatic malignant melanoma based on the most recent pathology report from [facility] in October 2019, presented with large left left upper/left lower lobe mass with a hypermetabolic AP window lymph node as well as solitary metastatic brain lesion diagnosed in May 2019.  Biomarker Findings Microsatellite status - MS-Stable Tumor Mutational Burden - TMB-Intermediate (16 Muts/Mb) Genomic Findings For a complete list of the genes assayed, please refer to the Appendix. CD274 (PD-L1) amplification NRAS Q61R PDCD1LG2 (PD-L2) amplification MYC amplification EPHB1 amplification JAK2 amplification RB1 Q93*, G256* TERT promoter -146C>T TP53 C275W   PRIOR THERAPY: 1) left temporal craniotomy with resection of tumor with intraoperative stereotactic guidance for volumetric resection under the care of Dr. Annette Stable on 04/29/2018. 2) First-line treatment with immunotherapy with Keytruda 200 mg IV every 3 weeks.  First dose June 02, 2018.  Status post 3 cycles.  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 3) palliative radiotherapy to the left lung mass under the care of Dr. Lisbeth Renshaw completed on 08/24/2018. 4) Resuming her treatment again with Keytruda 200 mg IV every 3 weeks, first dose 09/08/2018.  Status post 2 cycles.  CURRENT THERAPY: Treatment with immunotherapy with ipilimumab 3 mg/KG and nivolumab 1 mg/KG every 3 weeks for the first 4 cycles followed by maintenance nivolumab 240 mg IV every 2 weeks.  First dose of this treatment October 26, 2018.  Status post 2 cycles.  INTERVAL HISTORY: Leslie [name] 42 y.o. female returns to the clinic today for follow-up visit accompanied by her boyfriend.  The patient is feeling fine today with no concerning  complaints.  She has been tolerating her treatment with ipilimumab and nivolumab fairly well.  She denied having any significant weight loss or night sweats.  She has no persistent chest pain, shortness of breath, cough or hemoptysis.  She has no nausea, vomiting, diarrhea or constipation.  She has no significant skin rash.  She is here today for evaluation before starting cycle #3.  MEDICAL HISTORY: Past Medical History:  Diagnosis Date  . Migraines   . Yeast infection     ALLERGIES:  has No Known Allergies.  MEDICATIONS:  Current Outpatient Medications  Medication Sig Dispense Refill  . Ascorbic Acid (LIQUID C 500) 500 MG/15ML LIQD Take 1,000 mg by mouth daily.    . Cholecalciferol 1000 UNIT/10ML LIQD Take by mouth.    Marland Kitchen GAMMA AMINOBUTYRIC ACID PO Take by mouth.    Marland Kitchen HCA CALCIUM-MAGNESIUM-ZINC PO Take by mouth. Taking one half tablespoon daily    . Multiple Vitamin (MULTIVITAMIN) tablet Take 1 tablet by mouth daily.    . Omega 3-6-9 Fatty Acids (OMEGA 3-6-9 COMPLEX PO) Take 1 capsule by mouth daily.    Marland Kitchen OVER THE COUNTER MEDICATION     . Doxylamine Succinate, Sleep, (UNISOM PO) Take by mouth.    . Melatonin 5 MG CAPS Take by mouth.    . prochlorperazine (COMPAZINE) 10 MG tablet Take 1 tablet (10 mg total) by mouth every 6 (six) hours as needed for nausea or vomiting. (Patient not taking: Reported on 09/07/2018) 30 tablet 0   No current facility-administered medications for this visit.     SURGICAL HISTORY:  Past Surgical History:  Procedure Laterality Date  . APPLICATION OF CRANIAL NAVIGATION Left 04/29/2018   Procedure: APPLICATION OF CRANIAL NAVIGATION;  Surgeon:  Earnie Larsson, MD;  Location: [place];  Service: Neurosurgery;  Laterality: Left;  . CRANIOTOMY Left 04/29/2018   Procedure: LEFT CRANIOTOMY FOR  TUMOR BRAIN LAB;  Surgeon: Earnie Larsson, MD;  Location: [place];  Service: Neurosurgery;  Laterality: Left;    REVIEW OF SYSTEMS:  A comprehensive review of systems was negative.    PHYSICAL EXAMINATION: General appearance: alert, cooperative and no distress Head: Normocephalic, without obvious abnormality, atraumatic Neck: no adenopathy, no JVD, supple, symmetrical, trachea midline and thyroid not enlarged, symmetric, no tenderness/mass/nodules Lymph nodes: Cervical, supraclavicular, and axillary nodes normal. Resp: clear to auscultation bilaterally Back: negative, symmetric, no curvature. ROM normal. No CVA tenderness. Cardio: regular rate and rhythm, S1, S2 normal, no murmur, click, rub or gallop GI: soft, non-tender; bowel sounds normal; no masses,  no organomegaly Extremities: extremities normal, atraumatic, no cyanosis or edema  ECOG PERFORMANCE STATUS: 0 - Asymptomatic  Blood pressure 118/70, pulse 78, temperature 98.7 F (37.1 C), temperature source Oral, resp. rate 18, height 5' 9"  (1.753 m), weight 146 lb 8 oz (66.5 kg), SpO2 99 %, unknown if currently breastfeeding.  LABORATORY DATA: Lab Results  Component Value Date   WBC 7.6 12/06/2018   HGB 13.3 12/06/2018   HCT 39.8 12/06/2018   MCV 87.9 12/06/2018   PLT 277 12/06/2018      Chemistry      Component Value Date/Time   NA 140 11/17/2018 1246   K 5.0 11/17/2018 1246   CL 107 11/17/2018 1246   CO2 26 11/17/2018 1246   BUN 16 11/17/2018 1246   CREATININE 0.88 11/17/2018 1246      Component Value Date/Time   CALCIUM 9.6 11/17/2018 1246   ALKPHOS 92 11/17/2018 1246   AST 33 11/17/2018 1246   ALT 24 11/17/2018 1246   BILITOT 0.3 11/17/2018 1246       RADIOGRAPHIC STUDIES: No results found.  ASSESSMENT AND PLAN: This is a very pleasant 42 years old white female with highly suspicious metastatic malignant melanoma presented with large mass in the left upper/left lower lobe and mediastinal lymphadenopathy as well as solitary brain metastasis status post left temporal craniotomy and resection of tumor on 04/29/2018 and she is recovering well from her surgery. The patient completed  stereotactic radiotherapy to the resection cavity next week under the care of Dr. Lisbeth Renshaw. The patient underwent treatment with immunotherapy with Keytruda 200 mg IV every 3 weeks status post 3 cycles.   Her scan after cycle #3 showed enlargement of the left upper lobe lung mass.  This was also suspicious for pseudo-progression on immunotherapy.  The patient was started on a palliative course of radiotherapy to the left upper lobe lung mass and she tolerated this treatment fairly well.  She was seen by Dr. Emelda Brothers at [facility] and she recommended for the patient to resume her treatment with [facility] for now until she undergoes further molecular studies.  The patient was treated with 2 more cycles of Keytruda and tolerated the treatment well. Her recent CT scan of the chest, abdomen and pelvis showed some improvement in the left upper lobe lung mass but there is still concern about pericardial invasion. I personally and independently reviewed the scan images and discussed the result and showed the images to the patient and her boyfriend today.  She is still not a good candidate for surgical resection because of the pericardial invasion. The final pathology report and recommendation from [facility] was consistent with metastatic melanoma and the recommendation is to switch  the patient to a combination immunotherapy with Ipilumumab and nivolumab. The patient is currently undergoing treatment with immunotherapy with ipilimumab and nivolumab status post 2 cycles.  She has been tolerating this treatment well with no concerning adverse effects. I recommended for her to proceed with cycle #3 today as scheduled. I will see her back for follow-up visit in 3 weeks for evaluation before starting cycle #4.  We will arrange staging work-up after cycle #4 before proceeding with the maintenance phase with nivolumab. The patient was advised to call immediately if she has any concerning symptoms in  the interval. The patient voices understanding of current disease status and treatment options and is in agreement with the current care plan. All questions were answered. The patient knows to call the clinic with any problems, questions or concerns. We can certainly see the patient much sooner if necessary. I spent 10 minutes counseling the patient face to face. The total time spent in the appointment was 15 minutes.   Disclaimer: This note was dictated with voice recognition software. Similar sounding words can inadvertently be transcribed and may not be corrected upon review.

## 2018-12-08 ENCOUNTER — Telehealth: Payer: Self-pay | Admitting: Internal Medicine

## 2018-12-08 ENCOUNTER — Other Ambulatory Visit: Payer: BLUE CROSS/BLUE SHIELD

## 2018-12-08 NOTE — Telephone Encounter (Signed)
Scheduled appt per 12/24 los -pt to get an updated schedule next visit.

## 2018-12-12 ENCOUNTER — Other Ambulatory Visit: Payer: Self-pay | Admitting: Radiation Therapy

## 2018-12-15 ENCOUNTER — Other Ambulatory Visit: Payer: BLUE CROSS/BLUE SHIELD

## 2018-12-16 ENCOUNTER — Other Ambulatory Visit: Payer: Self-pay | Admitting: Medical Oncology

## 2018-12-16 ENCOUNTER — Ambulatory Visit
Admission: RE | Admit: 2018-12-16 | Discharge: 2018-12-16 | Disposition: A | Discharge status: Home / Self Care | Payer: BLUE CROSS/BLUE SHIELD | Source: Ambulatory Visit | Attending: Radiation Oncology | Admitting: Radiation Oncology

## 2018-12-16 ENCOUNTER — Telehealth: Payer: Self-pay | Admitting: Medical Oncology

## 2018-12-16 DIAGNOSIS — C801 Malignant (primary) neoplasm, unspecified: Secondary | ICD-10-CM | POA: Diagnosis not present

## 2018-12-16 DIAGNOSIS — G4700 Insomnia, unspecified: Secondary | ICD-10-CM

## 2018-12-16 DIAGNOSIS — C7931 Secondary malignant neoplasm of brain: Secondary | ICD-10-CM

## 2018-12-16 MED ORDER — GADOBENATE DIMEGLUMINE 529 MG/ML IV SOLN
13.0000 mL | Freq: Once | INTRAVENOUS | Status: AC | PRN
  Administered 2018-12-16: 13 mL via INTRAVENOUS

## 2018-12-16 MED ORDER — TEMAZEPAM 15 MG PO CAPS: 15.0000 mg | ORAL_CAPSULE | Freq: Every evening | ORAL | 0 refills | Status: DC | PRN

## 2018-12-16 NOTE — Telephone Encounter (Signed)
insomnia -on steroid dose pak. Restoril called in per [facility].

## 2018-12-19 ENCOUNTER — Inpatient Hospital Stay: Payer: BLUE CROSS/BLUE SHIELD

## 2018-12-20 ENCOUNTER — Ambulatory Visit
Admission: RE | Admit: 2018-12-20 | Discharge: 2018-12-20 | Disposition: A | Discharge status: Home / Self Care | Payer: BLUE CROSS/BLUE SHIELD | Source: Ambulatory Visit | Attending: Radiation Oncology | Admitting: Radiation Oncology

## 2018-12-20 ENCOUNTER — Other Ambulatory Visit: Payer: Self-pay

## 2018-12-20 ENCOUNTER — Encounter: Payer: Self-pay | Admitting: Radiation Oncology

## 2018-12-20 VITALS — BP 120/73 | HR 76 | Temp 98.3°F | Resp 18 | Ht 69.0 in | Wt 147.8 lb

## 2018-12-20 DIAGNOSIS — C7931 Secondary malignant neoplasm of brain: Secondary | ICD-10-CM | POA: Insufficient documentation

## 2018-12-20 DIAGNOSIS — Z7982 Long term (current) use of aspirin: Secondary | ICD-10-CM | POA: Insufficient documentation

## 2018-12-20 DIAGNOSIS — G4700 Insomnia, unspecified: Secondary | ICD-10-CM | POA: Insufficient documentation

## 2018-12-20 DIAGNOSIS — Z712 Person consulting for explanation of examination or test findings: Secondary | ICD-10-CM | POA: Diagnosis not present

## 2018-12-20 DIAGNOSIS — G478 Other sleep disorders: Secondary | ICD-10-CM | POA: Diagnosis not present

## 2018-12-20 DIAGNOSIS — C7949 Secondary malignant neoplasm of other parts of nervous system: Secondary | ICD-10-CM

## 2018-12-20 DIAGNOSIS — T380X5A Adverse effect of glucocorticoids and synthetic analogues, initial encounter: Secondary | ICD-10-CM | POA: Insufficient documentation

## 2018-12-20 DIAGNOSIS — R918 Other nonspecific abnormal finding of lung field: Secondary | ICD-10-CM | POA: Insufficient documentation

## 2018-12-20 DIAGNOSIS — R600 Localized edema: Secondary | ICD-10-CM | POA: Insufficient documentation

## 2018-12-20 DIAGNOSIS — C439 Malignant melanoma of skin, unspecified: Secondary | ICD-10-CM | POA: Insufficient documentation

## 2018-12-20 DIAGNOSIS — G479 Sleep disorder, unspecified: Secondary | ICD-10-CM | POA: Diagnosis not present

## 2018-12-20 DIAGNOSIS — Z79899 Other long term (current) drug therapy: Secondary | ICD-10-CM | POA: Diagnosis not present

## 2018-12-20 DIAGNOSIS — Z7952 Long term (current) use of systemic steroids: Secondary | ICD-10-CM | POA: Diagnosis not present

## 2018-12-20 DIAGNOSIS — C7802 Secondary malignant neoplasm of left lung: Secondary | ICD-10-CM | POA: Diagnosis not present

## 2018-12-21 NOTE — Progress Notes (Signed)
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 In summary this is a pleasant 43 y.o. woman who initially presented in the summer of 2019 with a solitary brain tumor who underwent surgical resection leading to a diagnosis of high grade carcinoma. She received postoperative SRS to the site. She was found to have a large lung tumor and we initially considered palliative radiotherapy versus surgery, but the decision was to proceed with systemic therapy with Keytruda as her path was reviewed by several facilities and felt to be most consistent with melanoma. She received 3 cycles of treatment and repeat imaging on 08/01/18 revealed progression of her left lung mass to  Measure 8 x 5.6 cm (previously 5.6 x 3.9 cm). She did not have any additional areas of progression. She went on to receive palliative radiotherapy to the left lung mass in late summer 2019   She was seen at [facility] by Dr. Emelda Brothers, and she has been on doublet immunotherapy since. She comes today to review her most recent MRI of the brain on 12/16/2018 that revealed stability of her post surgical resection site and no new disease or evidence of active disease. She comes to review this today.  On review of systems, the patient reports that she is doing well overall. She reports she has had some trouble with staying asleep at night and  attributes this to ongoing Prednisone. She reports she is otherwise doing well. She denies any chest pain, shortness of breath, cough, fevers, chills, night sweats, unintended weight changes. She denies any bowel or bladder disturbances, and denies abdominal pain, nausea or vomiting. She denies any difficulty with seizure activity, loss of visual or spatial acuity, headaches. She denies any new musculoskeletal or joint aches or pains, new skin lesions or concerns. A complete review of systems is obtained and is otherwise negative.   Past Medical History:  Past Medical History:  Diagnosis Date  . Migraines   . Yeast infection     Past Surgical History: Past Surgical History:  Procedure Laterality Date  . APPLICATION OF CRANIAL NAVIGATION Left 04/29/2018   Procedure: APPLICATION OF CRANIAL NAVIGATION;  Surgeon: Earnie Larsson, MD;  Location: [place];  Service: Neurosurgery;  Laterality: Left;  . CRANIOTOMY Left 04/29/2018   Procedure: LEFT CRANIOTOMY FOR  TUMOR BRAIN LAB;  Surgeon: Earnie Larsson, MD;  Location: [place];  Service: Neurosurgery;  Laterality: Left;    Social History:  Social History   Socioeconomic History  . Marital status: Divorced    Spouse name: Not on file  . Number of children: Not on file  . Years of education: Not on file  . Highest education level: Not on file  Occupational History  . Not on file  Social Needs  . Financial resource strain: Not on file  . Food insecurity:    Worry: Not on file    Inability: Not on  file  . Transportation needs:    Medical: Not on file    Non-medical: Not on file  Tobacco Use  . Smoking status: Never Smoker  . Smokeless tobacco: Never Used  Substance and Sexual Activity  . Alcohol use: Yes  . Drug use: No  . Sexual activity: Yes    Birth control/protection: Condom  Lifestyle  . Physical activity:    Days per week: Not on file    Minutes per session: Not on file  . Stress: Not on file  Relationships  . Social connections:     Talks on phone: Not on file    Gets together: Not on file    Attends religious service: Not on file    Active member of club or organization: Not on file    Attends meetings of clubs or organizations: Not on file    Relationship status: Not on file  . Intimate partner violence:    Fear of current or ex partner: No    Emotionally abused: No    Physically abused: No    Forced sexual activity: No  Other Topics Concern  . Not on file  Social History Narrative  . Not on file  The patient is divorced but in a relationship. She has teenage children. 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 She is very physically active regularly.  Family History: Family History  Problem Relation Age of Onset  . Cancer Father        lung    ALLERGIES:  has No Known Allergies.  Meds: Current Outpatient Medications  Medication Sig Dispense Refill  . Ascorbic Acid (LIQUID C 500) 500 MG/15ML LIQD Take 1,000 mg by mouth daily.    . Cholecalciferol 1000 UNIT/10ML LIQD Take by mouth.    . Doxylamine Succinate, Sleep, (UNISOM PO) Take by mouth.    Marland Kitchen GAMMA AMINOBUTYRIC ACID PO Take by mouth.    Marland Kitchen HCA CALCIUM-MAGNESIUM-ZINC PO Take by mouth. Taking one half tablespoon daily    . Melatonin 5 MG CAPS Take by mouth.    . Multiple Vitamin (MULTIVITAMIN) tablet Take 1 tablet by mouth daily.    . Omega 3-6-9 Fatty Acids (OMEGA 3-6-9 COMPLEX PO) Take 1 capsule by mouth daily.    Marland Kitchen OVER THE COUNTER MEDICATION     . predniSONE (DELTASONE) 20 MG tablet 3 tablets p.o. daily for 1 week, 2 tablets p.o. daily for 1 week, then 1 tablet p.o. daily for 1 week then half a tablet p.o. daily for 1 week. 50 tablet 0  . prochlorperazine (COMPAZINE) 10 MG tablet Take 1 tablet (10 mg total) by mouth every 6 (six) hours as needed for nausea or vomiting. 30 tablet 0  . temazepam (RESTORIL) 15 MG capsule Take 1 capsule (15 mg total) by mouth at bedtime as needed for sleep. 20 capsule 0   No  current facility-administered medications for this encounter.     Physical Findings:  height is 5' 9"  (1.753 m) and weight is 147 lb 12.8 oz (67 kg). Her temperature is 98.3 F (36.8 C). Her blood pressure is 120/73 and her pulse is 76. Her respiration is 18 and oxygen saturation is 98%.  Pain Assessment Pain Score: 0-No pain/10 In general this is a well appearing caucasian female in no acute distress. She's alert and oriented x4 and appropriate throughout the examination. Cardiopulmonary assessment is negative for acute distress and she exhibits normal effort. Her left lateral craniotomy incision site is  well healed.  No focal neurologic findings are appreciated.  Lab Findings: Lab Results  Component Value Date   WBC 7.6 12/06/2018   HGB 13.3 12/06/2018   HCT 39.8 12/06/2018   MCV 87.9 12/06/2018   PLT 277 12/06/2018     Radiographic Findings: Mr Jeri Cos AJ Contrast  Result Date: 12/16/2018 CLINICAL DATA:  Malignant melanoma with metastatic disease. Postop resection of left temporal lobe mass EXAM: MRI HEAD WITHOUT AND WITH CONTRAST TECHNIQUE: Multiplanar, multiecho pulse sequences of the brain and surrounding structures were obtained without and with intravenous contrast. CONTRAST:  55m MULTIHANCE GADOBENATE DIMEGLUMINE 529 MG/ML IV SOLN COMPARISON:  MRI head 09/01/2018, 05/23/2018 FINDINGS: Brain: Left temporal craniotomy for resection of mass. Small area of enhancement along the floor of the middle cranial fossa on the left is stable and may be postoperative enhancement. FLAIR signal in the left temporal lobe at the surgical resection site is stable. No recurrent tumor in the left temporal lobe. No new areas of metastatic disease. Small hyperintensities in the right frontal white matter anteriorly are stable. Small hyperintensities in the parietal white matter bilaterally are stable. No new enhancing metastatic deposits. Ventricle size normal. No midline shift. T2 hyperintensity in the  medulla on coronal T2 imaging appears to be an artifact and not confirmed on axial images. No enhancing lesion in the area. Vascular: Normal arterial flow void Skull and upper cervical spine: Negative for skull lesion. Sinuses/Orbits: Mild mucosal edema bilaterally.  Normal orbit. Other: None IMPRESSION: Stable post treatment changes left temporal lobe. No recurrent or new metastatic disease identified. Electronically Signed   By: CFranchot GalloM.D.   On: 12/16/2018 12:00    Impression/Plan: 1. 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  The patient is doing well overall with her current doublet immunotherapy regimen. She will proceed next week with her next cycle and anticipates restaging scans following this course. She will be due for repeat imaging with MRI of the brain in about 3 months. We will coordinate this through our navigator and present in brain oncology conference. 2. 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 The patient continues taper with Dr. MJulien Nordmannbut was counseled on some sleep hygiene suggestions to aid in her ability to stay asleep.    ACarola Rhine PAC

## 2018-12-27 ENCOUNTER — Other Ambulatory Visit: Payer: Self-pay | Admitting: Internal Medicine

## 2018-12-28 ENCOUNTER — Encounter: Payer: Self-pay | Admitting: Internal Medicine

## 2018-12-28 ENCOUNTER — Telehealth: Payer: Self-pay

## 2018-12-28 ENCOUNTER — Inpatient Hospital Stay: Payer: BLUE CROSS/BLUE SHIELD

## 2018-12-28 ENCOUNTER — Other Ambulatory Visit: Payer: Self-pay | Admitting: *Deleted

## 2018-12-28 ENCOUNTER — Inpatient Hospital Stay: Payer: BLUE CROSS/BLUE SHIELD | Attending: Internal Medicine | Admitting: Internal Medicine

## 2018-12-28 VITALS — BP 114/73 | HR 67 | Temp 98.3°F | Resp 18 | Ht 69.0 in | Wt 147.9 lb

## 2018-12-28 DIAGNOSIS — C7931 Secondary malignant neoplasm of brain: Secondary | ICD-10-CM | POA: Insufficient documentation

## 2018-12-28 DIAGNOSIS — C7802 Secondary malignant neoplasm of left lung: Secondary | ICD-10-CM | POA: Insufficient documentation

## 2018-12-28 DIAGNOSIS — R232 Flushing: Secondary | ICD-10-CM | POA: Diagnosis not present

## 2018-12-28 DIAGNOSIS — Z79899 Other long term (current) drug therapy: Secondary | ICD-10-CM

## 2018-12-28 DIAGNOSIS — K759 Inflammatory liver disease, unspecified: Secondary | ICD-10-CM | POA: Insufficient documentation

## 2018-12-28 DIAGNOSIS — R5382 Chronic fatigue, unspecified: Secondary | ICD-10-CM

## 2018-12-28 DIAGNOSIS — G4700 Insomnia, unspecified: Secondary | ICD-10-CM | POA: Insufficient documentation

## 2018-12-28 DIAGNOSIS — R600 Localized edema: Secondary | ICD-10-CM | POA: Diagnosis not present

## 2018-12-28 DIAGNOSIS — C439 Malignant melanoma of skin, unspecified: Secondary | ICD-10-CM | POA: Diagnosis not present

## 2018-12-28 DIAGNOSIS — C3432 Malignant neoplasm of lower lobe, left bronchus or lung: Secondary | ICD-10-CM

## 2018-12-28 LAB — CBC WITH DIFFERENTIAL (CANCER CENTER ONLY)
Abs Immature Granulocytes: 0.02 10*3/uL (ref 0.00–0.07)
Basophils Absolute: 0 10*3/uL (ref 0.0–0.1)
Basophils Relative: 0 %
Eosinophils Absolute: 0 10*3/uL (ref 0.0–0.5)
Eosinophils Relative: 0 %
HCT: 43.3 % (ref 36.0–46.0)
Hemoglobin: 14.2 g/dL (ref 12.0–15.0)
Immature Granulocytes: 0 %
Lymphocytes Relative: 5 %
Lymphs Abs: 0.4 10*3/uL — ABNORMAL LOW (ref 0.7–4.0)
MCH: 30.1 pg (ref 26.0–34.0)
MCHC: 32.8 g/dL (ref 30.0–36.0)
MCV: 91.9 fL (ref 80.0–100.0)
Monocytes Absolute: 0.4 10*3/uL (ref 0.1–1.0)
Monocytes Relative: 5 %
Neutro Abs: 7.4 10*3/uL (ref 1.7–7.7)
Neutrophils Relative %: 90 %
Platelet Count: 294 10*3/uL (ref 150–400)
RBC: 4.71 MIL/uL (ref 3.87–5.11)
RDW: 14.1 % (ref 11.5–15.5)
WBC Count: 8.2 10*3/uL (ref 4.0–10.5)
nRBC: 0 % (ref 0.0–0.2)

## 2018-12-28 LAB — CMP (CANCER CENTER ONLY)
ALT: 869 U/L (ref 0–44)
AST: 335 U/L (ref 15–41)
Albumin: 3.8 g/dL (ref 3.5–5.0)
Alkaline Phosphatase: 155 U/L — ABNORMAL HIGH (ref 38–126)
Anion gap: 8 (ref 5–15)
BUN: 16 mg/dL (ref 6–20)
CO2: 25 mmol/L (ref 22–32)
Calcium: 9.1 mg/dL (ref 8.9–10.3)
Chloride: 108 mmol/L (ref 98–111)
Creatinine: 0.79 mg/dL (ref 0.44–1.00)
GFR, Est AFR Am: >60 mL/min (ref >60)
GFR, Estimated: >60 mL/min (ref >60)
Glucose, Bld: 93 mg/dL (ref 70–99)
Potassium: 4.6 mmol/L (ref 3.5–5.1)
Sodium: 141 mmol/L (ref 135–145)
Total Bilirubin: 0.8 mg/dL (ref 0.3–1.2)
Total Protein: 7 g/dL (ref 6.5–8.1)

## 2018-12-28 LAB — TSH: TSH: 0.985 u[IU]/mL (ref 0.308–3.960)

## 2018-12-28 MED ORDER — PREDNISONE 20 MG PO TABS: ORAL_TABLET | ORAL | 0 refills | Status: DC

## 2018-12-28 NOTE — Telephone Encounter (Signed)
Printed avs and calender of upcoming appointment. Per 1/15 RN verbal los from MM

## 2018-12-28 NOTE — [facility] Notes (Signed)
Called pt on 12/22/18 to discuss copay assistance for [facility].  I completed the application, got hers and Dr. Worthy Flank signature and faxed the application to [place] for processing today.  Once I hear back from the foundation I will notify the pt of the outcome.  She has my card for any questions or concerns she may have in the future.

## 2018-12-28 NOTE — Progress Notes (Signed)
[place] [phone]   Fax:([phone]  OFFICE PROGRESS NOTE  System, Pcp Not In No address on file  DIAGNOSIS: Metastatic high-grade neoplasm consistent with metastatic malignant melanoma based on the most recent pathology report from [facility] in October 2019, presented with large left left upper/left lower lobe mass with a hypermetabolic AP window lymph node as well as solitary metastatic brain lesion diagnosed in May 2019.  Biomarker Findings Microsatellite status - MS-Stable Tumor Mutational Burden - TMB-Intermediate (16 Muts/Mb) Genomic Findings For a complete list of the genes assayed, please refer to the Appendix. CD274 (PD-L1) amplification NRAS Q61R PDCD1LG2 (PD-L2) amplification MYC amplification EPHB1 amplification JAK2 amplification RB1 Q93*, A540* TERT promoter -146C>T TP53 C275W   PRIOR THERAPY: 1) left temporal craniotomy with resection of tumor with intraoperative stereotactic guidance for volumetric resection under the care of Dr. Annette Stable on 04/29/2018. 2) First-line treatment with immunotherapy with Keytruda 200 mg IV every 3 weeks.  First dose June 02, 2018.  Status post 3 cycles.  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 3) palliative radiotherapy to the left lung mass under the care of Dr. Lisbeth Renshaw completed on 08/24/2018. 4) Resuming her treatment again with Keytruda 200 mg IV every 3 weeks, first dose 09/08/2018.  Status post 2 cycles.  CURRENT THERAPY: Treatment with immunotherapy with ipilimumab 3 mg/KG and nivolumab 1 mg/KG every 3 weeks for the first 4 cycles followed by maintenance nivolumab 240 mg IV every 2 weeks.  First dose of this treatment October 26, 2018.  Status post 2 cycles.  INTERVAL HISTORY: Leslie [name] 43 y.o. female returns to the clinic today for follow-up visit accompanied by her boyfriend.  The patient is feeling fine today with no concerning  complaints except for insomnia.  She was started on treatment with Restoril but no improvement.  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 daily for 1 week followed by 40 mg p.o. daily for 1 week followed by 20 mg p.o. daily for 1 week.  She is here today for reevaluation and repeat blood work.  She denied having any chest pain, shortness of breath, cough or hemoptysis.  She has no nausea, vomiting, abdominal pain, diarrhea or constipation.  MEDICAL HISTORY: Past Medical History:  Diagnosis Date  . Migraines   . Yeast infection     ALLERGIES:  has No Known Allergies.  MEDICATIONS:  Current Outpatient Medications  Medication Sig Dispense Refill  . Ascorbic Acid (LIQUID C 500) 500 MG/15ML LIQD Take 1,000 mg by mouth daily.    . Cholecalciferol 1000 UNIT/10ML LIQD Take by mouth.    . Doxylamine Succinate, Sleep, (UNISOM PO) Take by mouth.    Marland Kitchen GAMMA AMINOBUTYRIC ACID PO Take by mouth.    Marland Kitchen HCA CALCIUM-MAGNESIUM-ZINC PO Take by mouth. Taking one half tablespoon daily    . Melatonin 5 MG CAPS Take by mouth.    . Multiple Vitamin (MULTIVITAMIN) tablet Take 1 tablet by mouth daily.    . Omega 3-6-9 Fatty Acids (OMEGA 3-6-9 COMPLEX PO) Take 1 capsule by mouth daily.    Marland Kitchen OVER THE COUNTER MEDICATION     . predniSONE (DELTASONE) 20 MG tablet 3 tablets (10m)  p.o. daily for 2 weeks. 45 tablet 0  . prochlorperazine (COMPAZINE) 10 MG tablet Take 1 tablet (10 mg total) by mouth every 6 (six) hours as  needed for nausea or vomiting. 30 tablet 0  . temazepam (RESTORIL) 15 MG capsule Take 1 capsule (15 mg total) by mouth at bedtime as needed for sleep. 20 capsule 0   No current facility-administered medications for this visit.     SURGICAL HISTORY:  Past Surgical History:  Procedure Laterality Date  . APPLICATION OF CRANIAL NAVIGATION Left 04/29/2018   Procedure: APPLICATION OF CRANIAL NAVIGATION;   Surgeon: Earnie Larsson, MD;  Location: [place];  Service: Neurosurgery;  Laterality: Left;  . CRANIOTOMY Left 04/29/2018   Procedure: LEFT CRANIOTOMY FOR  TUMOR BRAIN LAB;  Surgeon: Earnie Larsson, MD;  Location: [place];  Service: Neurosurgery;  Laterality: Left;    REVIEW OF SYSTEMS:  Constitutional: negative Eyes: negative Ears, nose, mouth, throat, and face: negative Respiratory: negative Cardiovascular: negative Gastrointestinal: negative Genitourinary:negative Integument/breast: negative Hematologic/lymphatic: negative Musculoskeletal:negative Neurological: negative Behavioral/Psych: positive for sleep disturbance Endocrine: negative Allergic/Immunologic: negative   PHYSICAL EXAMINATION: General appearance: alert, cooperative and no distress Head: Normocephalic, without obvious abnormality, atraumatic Neck: no adenopathy, no JVD, supple, symmetrical, trachea midline and thyroid not enlarged, symmetric, no tenderness/mass/nodules Lymph nodes: Cervical, supraclavicular, and axillary nodes normal. Resp: clear to auscultation bilaterally Back: negative, symmetric, no curvature. ROM normal. No CVA tenderness. Cardio: regular rate and rhythm, S1, S2 normal, no murmur, click, rub or gallop GI: soft, non-tender; bowel sounds normal; no masses,  no organomegaly Extremities: extremities normal, atraumatic, no cyanosis or edema Neurologic: Alert and oriented X 3, normal strength and tone. Normal symmetric reflexes. Normal coordination and gait  ECOG PERFORMANCE STATUS: 0 - Asymptomatic  Blood pressure 114/73, pulse 67, temperature 98.3 F (36.8 C), temperature source Oral, resp. rate 18, height _0  (1.753 m), weight 147 lb 14.4 oz (67.1 kg), SpO2 99 %, unknown if currently breastfeeding.  LABORATORY DATA: Lab Results  Component Value Date   WBC 8.2 12/28/2018   HGB 14.2 12/28/2018   HCT 43.3 12/28/2018   MCV 91.9 12/28/2018   PLT 294 12/28/2018      Chemistry      Component Value  Date/Time   NA 141 12/28/2018 1143   K 4.6 12/28/2018 1143   CL 108 12/28/2018 1143   CO2 25 12/28/2018 1143   BUN 16 12/28/2018 1143   CREATININE 0.79 12/28/2018 1143      Component Value Date/Time   CALCIUM 9.1 12/28/2018 1143   ALKPHOS 155 (H) 12/28/2018 1143   AST 335 (HH) 12/28/2018 1143   ALT 869 (HH) 12/28/2018 1143   BILITOT 0.8 12/28/2018 1143       RADIOGRAPHIC STUDIES: Mr Jeri Cos QZ Contrast  Result Date: 12/16/2018 CLINICAL DATA:  Malignant melanoma with metastatic disease. Postop resection of left temporal lobe mass EXAM: MRI HEAD WITHOUT AND WITH CONTRAST TECHNIQUE: Multiplanar, multiecho pulse sequences of the brain and surrounding structures were obtained without and with intravenous contrast. CONTRAST:  79m MULTIHANCE GADOBENATE DIMEGLUMINE 529 MG/ML IV SOLN COMPARISON:  MRI head 09/01/2018, 05/23/2018 FINDINGS: Brain: Left temporal craniotomy for resection of mass. Small area of enhancement along the floor of the middle cranial fossa on the left is stable and may be postoperative enhancement. FLAIR signal in the left temporal lobe at the surgical resection site is stable. No recurrent tumor in the left temporal lobe. No new areas of metastatic disease. Small hyperintensities in the right frontal white matter anteriorly are stable. Small hyperintensities in the parietal white matter bilaterally are stable. No new enhancing metastatic deposits. Ventricle size normal. No midline shift. T2 hyperintensity in the  medulla on coronal T2 imaging appears to be an artifact and not confirmed on axial images. No enhancing lesion in the area. Vascular: Normal arterial flow void Skull and upper cervical spine: Negative for skull lesion. Sinuses/Orbits: Mild mucosal edema bilaterally.  Normal orbit. Other: None IMPRESSION: Stable post treatment changes left temporal lobe. No recurrent or new metastatic disease identified. Electronically Signed   By: Franchot Gallo M.D.   On: 12/16/2018 12:00      ASSESSMENT AND PLAN: This is a very pleasant 43 years old white female with highly suspicious metastatic malignant melanoma presented with large mass in the left upper/left lower lobe and mediastinal lymphadenopathy as well as solitary brain metastasis status post left temporal craniotomy and resection of tumor on 04/29/2018 and she is recovering well from her surgery. The patient completed stereotactic radiotherapy to the resection cavity next week under the care of Dr. Lisbeth Renshaw. The patient underwent treatment with immunotherapy with Keytruda 200 mg IV every 3 weeks status post 3 cycles.   Her scan after cycle #3 showed enlargement of the left upper lobe lung mass.  This was also suspicious for pseudo-progression on immunotherapy.  The patient was started on a palliative course of radiotherapy to the left upper lobe lung mass and she tolerated this treatment fairly well.  She was seen by Dr. Emelda Brothers at [facility] and she recommended for the patient to resume her treatment with [facility] for now until she undergoes further molecular studies.  The patient was treated with 2 more cycles of Keytruda and tolerated the treatment well. Her recent CT scan of the chest, abdomen and pelvis showed some improvement in the left upper lobe lung mass but there is still concern about pericardial invasion. I personally and independently reviewed the scan images and discussed the result and showed the images to the patient and her boyfriend today.  She is still not a good candidate for surgical resection because of the pericardial invasion. The final pathology report and recommendation from [facility] was consistent with metastatic melanoma and the recommendation is to switch the patient to a combination immunotherapy with Ipilumumab and nivolumab. The patient is currently undergoing treatment with immunotherapy with ipilimumab and nivolumab status post 2 cycles.   The patient was tolerating this  treatment well but unfortunately she developed immunotherapy mediated hepatitis and her treatment is currently on hold. Repeat comprehensive metabolic panel today showed improvement but no complete resolution of her hepatic dysfunction. I recommended for the patient to resume her treatment with high-dose steroids and she will start on 60 mg p.o. daily again for 2 weeks. I will see her back for follow-up visit in 2 weeks for evaluation and more guidance about tapering of her steroid dose. We will continue to hold her treatment with immunotherapy for now. For the insomnia she was advised to continue on Restoril and also use Benadryl if needed. She was advised to call immediately if she has any concerning symptoms in the interval. The patient voices understanding of current disease status and treatment options and is in agreement with the current care plan. All questions were answered. The patient knows to call the clinic with any problems, questions or concerns. We can certainly see the patient much sooner if necessary.  Disclaimer: This note was dictated with voice recognition software. Similar sounding words can inadvertently be transcribed and may not be corrected upon review.

## 2019-01-05 ENCOUNTER — Encounter: Payer: Self-pay | Admitting: Internal Medicine

## 2019-01-05 NOTE — Progress Notes (Signed)
Pt is approved w/ [employer] to receive copay assistance for $25,000 for [place] and $25,000 for Opdivo from 01/04/19 to 12/14/19.  Pt responsibility is $25 per product.  I will notify pt of the outcome.

## 2019-01-06 DIAGNOSIS — M2560 Stiffness of unspecified joint, not elsewhere classified: Secondary | ICD-10-CM | POA: Diagnosis not present

## 2019-01-06 DIAGNOSIS — M9901 Segmental and somatic dysfunction of cervical region: Secondary | ICD-10-CM | POA: Diagnosis not present

## 2019-01-06 DIAGNOSIS — M9905 Segmental and somatic dysfunction of pelvic region: Secondary | ICD-10-CM | POA: Diagnosis not present

## 2019-01-06 DIAGNOSIS — M62838 Other muscle spasm: Secondary | ICD-10-CM | POA: Diagnosis not present

## 2019-01-06 DIAGNOSIS — M9903 Segmental and somatic dysfunction of lumbar region: Secondary | ICD-10-CM | POA: Diagnosis not present

## 2019-01-09 ENCOUNTER — Other Ambulatory Visit: Payer: Self-pay | Admitting: *Deleted

## 2019-01-09 MED ORDER — PREDNISONE 20 MG PO TABS: ORAL_TABLET | ORAL | 0 refills | Status: AC

## 2019-01-09 NOTE — Telephone Encounter (Signed)
Refill on prednisone sent to pt pharmacy.

## 2019-01-11 ENCOUNTER — Other Ambulatory Visit: Payer: Self-pay | Admitting: Radiation Therapy

## 2019-01-11 DIAGNOSIS — C7931 Secondary malignant neoplasm of brain: Secondary | ICD-10-CM

## 2019-01-11 DIAGNOSIS — C7949 Secondary malignant neoplasm of other parts of nervous system: Principal | ICD-10-CM

## 2019-01-12 ENCOUNTER — Inpatient Hospital Stay (HOSPITAL_BASED_OUTPATIENT_CLINIC_OR_DEPARTMENT_OTHER): Payer: BLUE CROSS/BLUE SHIELD | Admitting: Internal Medicine

## 2019-01-12 ENCOUNTER — Encounter: Payer: Self-pay | Admitting: Internal Medicine

## 2019-01-12 ENCOUNTER — Inpatient Hospital Stay: Payer: BLUE CROSS/BLUE SHIELD

## 2019-01-12 VITALS — BP 125/87 | HR 71 | Temp 97.9°F | Resp 17 | Ht 69.0 in | Wt 152.8 lb

## 2019-01-12 DIAGNOSIS — K759 Inflammatory liver disease, unspecified: Secondary | ICD-10-CM

## 2019-01-12 DIAGNOSIS — Z79899 Other long term (current) drug therapy: Secondary | ICD-10-CM

## 2019-01-12 DIAGNOSIS — C7931 Secondary malignant neoplasm of brain: Secondary | ICD-10-CM

## 2019-01-12 DIAGNOSIS — G4700 Insomnia, unspecified: Secondary | ICD-10-CM

## 2019-01-12 DIAGNOSIS — C7802 Secondary malignant neoplasm of left lung: Secondary | ICD-10-CM

## 2019-01-12 DIAGNOSIS — R600 Localized edema: Secondary | ICD-10-CM | POA: Diagnosis not present

## 2019-01-12 DIAGNOSIS — C3432 Malignant neoplasm of lower lobe, left bronchus or lung: Secondary | ICD-10-CM

## 2019-01-12 DIAGNOSIS — C439 Malignant melanoma of skin, unspecified: Secondary | ICD-10-CM

## 2019-01-12 DIAGNOSIS — R232 Flushing: Secondary | ICD-10-CM | POA: Diagnosis not present

## 2019-01-12 LAB — CBC WITH DIFFERENTIAL (CANCER CENTER ONLY)
Abs Immature Granulocytes: 0.06 10*3/uL (ref 0.00–0.07)
Basophils Absolute: 0 10*3/uL (ref 0.0–0.1)
Basophils Relative: 0 %
Eosinophils Absolute: 0 10*3/uL (ref 0.0–0.5)
Eosinophils Relative: 0 %
HCT: 42.9 % (ref 36.0–46.0)
Hemoglobin: 14.1 g/dL (ref 12.0–15.0)
Immature Granulocytes: 0 %
Lymphocytes Relative: 7 %
Lymphs Abs: 1 10*3/uL (ref 0.7–4.0)
MCH: 30.5 pg (ref 26.0–34.0)
MCHC: 32.9 g/dL (ref 30.0–36.0)
MCV: 92.7 fL (ref 80.0–100.0)
Monocytes Absolute: 1.3 10*3/uL — ABNORMAL HIGH (ref 0.1–1.0)
Monocytes Relative: 9 %
Neutro Abs: 11.3 10*3/uL — ABNORMAL HIGH (ref 1.7–7.7)
Neutrophils Relative %: 84 %
Platelet Count: 323 10*3/uL (ref 150–400)
RBC: 4.63 MIL/uL (ref 3.87–5.11)
RDW: 14.1 % (ref 11.5–15.5)
WBC Count: 13.7 10*3/uL — ABNORMAL HIGH (ref 4.0–10.5)
nRBC: 0 % (ref 0.0–0.2)

## 2019-01-12 LAB — CMP (CANCER CENTER ONLY)
ALT: 251 U/L — ABNORMAL HIGH (ref 0–44)
AST: 98 U/L — ABNORMAL HIGH (ref 15–41)
Albumin: 3.7 g/dL (ref 3.5–5.0)
Alkaline Phosphatase: 132 U/L — ABNORMAL HIGH (ref 38–126)
Anion gap: 8 (ref 5–15)
BUN: 13 mg/dL (ref 6–20)
CO2: 25 mmol/L (ref 22–32)
Calcium: 9.1 mg/dL (ref 8.9–10.3)
Chloride: 107 mmol/L (ref 98–111)
Creatinine: 1.09 mg/dL — ABNORMAL HIGH (ref 0.44–1.00)
GFR, Est AFR Am: >60 mL/min (ref >60)
GFR, Estimated: >60 mL/min (ref >60)
Glucose, Bld: 81 mg/dL (ref 70–99)
Potassium: 4.1 mmol/L (ref 3.5–5.1)
Sodium: 140 mmol/L (ref 135–145)
Total Bilirubin: 0.5 mg/dL (ref 0.3–1.2)
Total Protein: 6.6 g/dL (ref 6.5–8.1)

## 2019-01-12 NOTE — Progress Notes (Signed)
[place] Telephone:(336) [phone]   Fax:(336) [phone]  OFFICE PROGRESS NOTE  System, Pcp Not In No address on file  DIAGNOSIS: Metastatic high-grade neoplasm consistent with metastatic malignant melanoma based on the most recent pathology report from [facility] in October 2019, presented with large left left upper/left lower lobe mass with a hypermetabolic AP window lymph node as well as solitary metastatic brain lesion diagnosed in May 2019.  Biomarker Findings Microsatellite status - MS-Stable Tumor Mutational Burden - TMB-Intermediate (16 Muts/Mb) Genomic Findings For a complete list of the genes assayed, please refer to the Appendix. CD274 (PD-L1) amplification NRAS Q61R PDCD1LG2 (PD-L2) amplification MYC amplification EPHB1 amplification JAK2 amplification RB1 Q93*, V956* TERT promoter -146C>T TP53 C275W   PRIOR THERAPY: 1) left temporal craniotomy with resection of tumor with intraoperative stereotactic guidance for volumetric resection under the care of Dr. Annette Stable on 04/29/2018. 2) First-line treatment with immunotherapy with Keytruda 200 mg IV every 3 weeks.  First dose June 02, 2018.  Status post 3 cycles.  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 3) palliative radiotherapy to the left lung mass under the care of Dr. Lisbeth Renshaw completed on 08/24/2018. 4) Resuming her treatment again with Keytruda 200 mg IV every 3 weeks, first dose 09/08/2018.  Status post 2 cycles.  CURRENT THERAPY: Treatment with immunotherapy with ipilimumab 3 mg/KG and nivolumab 1 mg/KG every 3 weeks for the first 4 cycles followed by maintenance nivolumab 240 mg IV every 2 weeks.  First dose of this treatment October 26, 2018.  Status post 2 cycles.  INTERVAL HISTORY: Leslie [name] 43 y.o. female returns to the clinic today for follow-up visit.  The patient is feeling fine today with no concerning complaints except for him  the hot flashes from the prednisone treatment.  She is currently on 60 mg p.o. daily.  She is tolerating it well except for the insomnia and weight gain.  She denied having any nausea, vomiting, diarrhea or constipation.  She denied having any chest pain, shortness of breath, cough or hemoptysis.  The patient is here today for evaluation and repeat blood work for close monitoring of her hepatic dysfunction.  MEDICAL HISTORY: Past Medical History:  Diagnosis Date  . Migraines   . Yeast infection     ALLERGIES:  has No Known Allergies.  MEDICATIONS:  Current Outpatient Medications  Medication Sig Dispense Refill  . Ascorbic Acid (LIQUID C 500) 500 MG/15ML LIQD Take 1,000 mg by mouth daily.    . Cholecalciferol 1000 UNIT/10ML LIQD Take by mouth.    . Doxylamine Succinate, Sleep, (UNISOM PO) Take by mouth.    Marland Kitchen GAMMA AMINOBUTYRIC ACID PO Take by mouth.    Marland Kitchen HCA CALCIUM-MAGNESIUM-ZINC PO Take by mouth. Taking one half tablespoon daily    . Melatonin 5 MG CAPS Take by mouth.    . Multiple Vitamin (MULTIVITAMIN) tablet Take 1 tablet by mouth daily.    . Omega 3-6-9 Fatty Acids (OMEGA 3-6-9 COMPLEX PO) Take 1 capsule by mouth daily.    Marland Kitchen OVER THE COUNTER MEDICATION     . predniSONE (DELTASONE) 20 MG tablet 3 tablets (4m)  p.o. daily for 2 weeks. 45 tablet 0  . prochlorperazine (COMPAZINE) 10 MG tablet Take 1 tablet (10 mg total) by mouth every 6 (six) hours as needed for nausea or vomiting. 30 tablet 0  . temazepam (RESTORIL) 15 MG capsule Take 1 capsule (15 mg total) by mouth at bedtime as needed for sleep.  20 capsule 0   No current facility-administered medications for this visit.     SURGICAL HISTORY:  Past Surgical History:  Procedure Laterality Date  . APPLICATION OF CRANIAL NAVIGATION Left 04/29/2018   Procedure: APPLICATION OF CRANIAL NAVIGATION;  Surgeon: Earnie Larsson, MD;  Location: [place];  Service: Neurosurgery;  Laterality: Left;  . CRANIOTOMY Left 04/29/2018   Procedure: LEFT  CRANIOTOMY FOR  TUMOR BRAIN LAB;  Surgeon: Earnie Larsson, MD;  Location: [place];  Service: Neurosurgery;  Laterality: Left;    REVIEW OF SYSTEMS:  A comprehensive review of systems was negative except for: Integument/breast: positive for Hot flashes Behavioral/Psych: positive for sleep disturbance   PHYSICAL EXAMINATION: General appearance: alert, cooperative and no distress Head: Normocephalic, without obvious abnormality, atraumatic Neck: no adenopathy, no JVD, supple, symmetrical, trachea midline and thyroid not enlarged, symmetric, no tenderness/mass/nodules Lymph nodes: Cervical, supraclavicular, and axillary nodes normal. Resp: clear to auscultation bilaterally Back: negative, symmetric, no curvature. ROM normal. No CVA tenderness. Cardio: regular rate and rhythm, S1, S2 normal, no murmur, click, rub or gallop GI: soft, non-tender; bowel sounds normal; no masses,  no organomegaly Extremities: extremities normal, atraumatic, no cyanosis or edema  ECOG PERFORMANCE STATUS: 0 - Asymptomatic  Blood pressure 125/87, pulse 71, temperature 97.9 F (36.6 C), temperature source Oral, resp. rate 17, height _0  (1.753 m), weight 152 lb 12.8 oz (69.3 kg), SpO2 100 %, unknown if currently breastfeeding.  LABORATORY DATA: Lab Results  Component Value Date   WBC 13.7 (H) 01/12/2019   HGB 14.1 01/12/2019   HCT 42.9 01/12/2019   MCV 92.7 01/12/2019   PLT 323 01/12/2019      Chemistry      Component Value Date/Time   NA 140 01/12/2019 0939   K 4.1 01/12/2019 0939   CL 107 01/12/2019 0939   CO2 25 01/12/2019 0939   BUN 13 01/12/2019 0939   CREATININE 1.09 (H) 01/12/2019 0939      Component Value Date/Time   CALCIUM 9.1 01/12/2019 0939   ALKPHOS 132 (H) 01/12/2019 0939   AST 98 (H) 01/12/2019 0939   ALT 251 (H) 01/12/2019 0939   BILITOT 0.5 01/12/2019 0017       RADIOGRAPHIC STUDIES: Mr Jeri Cos CB Contrast  Result Date: 12/16/2018 CLINICAL DATA:  Malignant melanoma with  metastatic disease. Postop resection of left temporal lobe mass EXAM: MRI HEAD WITHOUT AND WITH CONTRAST TECHNIQUE: Multiplanar, multiecho pulse sequences of the brain and surrounding structures were obtained without and with intravenous contrast. CONTRAST:  98m MULTIHANCE GADOBENATE DIMEGLUMINE 529 MG/ML IV SOLN COMPARISON:  MRI head 09/01/2018, 05/23/2018 FINDINGS: Brain: Left temporal craniotomy for resection of mass. Small area of enhancement along the floor of the middle cranial fossa on the left is stable and may be postoperative enhancement. FLAIR signal in the left temporal lobe at the surgical resection site is stable. No recurrent tumor in the left temporal lobe. No new areas of metastatic disease. Small hyperintensities in the right frontal white matter anteriorly are stable. Small hyperintensities in the parietal white matter bilaterally are stable. No new enhancing metastatic deposits. Ventricle size normal. No midline shift. T2 hyperintensity in the medulla on coronal T2 imaging appears to be an artifact and not confirmed on axial images. No enhancing lesion in the area. Vascular: Normal arterial flow void Skull and upper cervical spine: Negative for skull lesion. Sinuses/Orbits: Mild mucosal edema bilaterally.  Normal orbit. Other: None IMPRESSION: Stable post treatment changes left temporal lobe. No recurrent or new metastatic  disease identified. Electronically Signed   By: Franchot Gallo M.D.   On: 12/16/2018 12:00    ASSESSMENT AND PLAN: This is a very pleasant 43 years old white female with highly suspicious metastatic malignant melanoma presented with large mass in the left upper/left lower lobe and mediastinal lymphadenopathy as well as solitary brain metastasis status post left temporal craniotomy and resection of tumor on 04/29/2018 and she is recovering well from her surgery. The patient completed stereotactic radiotherapy to the resection cavity next week under the care of Dr.  Lisbeth Renshaw. The patient underwent treatment with immunotherapy with Keytruda 200 mg IV every 3 weeks status post 3 cycles.   Her scan after cycle #3 showed enlargement of the left upper lobe lung mass.  This was also suspicious for pseudo-progression on immunotherapy.  The patient was started on a palliative course of radiotherapy to the left upper lobe lung mass and she tolerated this treatment fairly well.  She was seen by Dr. Emelda Brothers at [facility] and she recommended for the patient to resume her treatment with [facility] for now until she undergoes further molecular studies.  The patient was treated with 2 more cycles of Keytruda and tolerated the treatment well. Her recent CT scan of the chest, abdomen and pelvis showed some improvement in the left upper lobe lung mass but there is still concern about pericardial invasion. I personally and independently reviewed the scan images and discussed the result and showed the images to the patient and her boyfriend today.  She is still not a good candidate for surgical resection because of the pericardial invasion. The final pathology report and recommendation from [facility] was consistent with metastatic melanoma and the recommendation is to switch the patient to a combination immunotherapy with Ipilumumab and nivolumab. The patient is currently undergoing treatment with immunotherapy with ipilimumab and nivolumab status post 2 cycles.   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 She is currently on high-dose prednisone 60 mg p.o. daily. Repeat comprehensive metabolic panel today showed further improvement in her liver enzymes but not complete resolution of the abnormality. I recommended for the patient to start tapering her dose of prednisone to 40 mg p.o. daily for 1 week followed by 20 mg p.o. daily for 1 week. I will see the  patient back for follow-up visit in 2 weeks for evaluation and repeat CT scan of the chest, abdomen and pelvis for restaging of her disease. The patient was advised to call immediately if she has any concerning symptoms in the interval. The patient voices understanding of current disease status and treatment options and is in agreement with the current care plan. All questions were answered. The patient knows to call the clinic with any problems, questions or concerns. We can certainly see the patient much sooner if necessary.  Disclaimer: This note was dictated with voice recognition software. Similar sounding words can inadvertently be transcribed and may not be corrected upon review.

## 2019-01-18 ENCOUNTER — Inpatient Hospital Stay: Payer: BLUE CROSS/BLUE SHIELD | Admitting: Internal Medicine

## 2019-01-18 ENCOUNTER — Inpatient Hospital Stay: Payer: BLUE CROSS/BLUE SHIELD

## 2019-01-25 ENCOUNTER — Inpatient Hospital Stay: Payer: BLUE CROSS/BLUE SHIELD

## 2019-01-25 ENCOUNTER — Telehealth: Payer: Self-pay | Admitting: Medical Oncology

## 2019-01-25 ENCOUNTER — Ambulatory Visit (HOSPITAL_COMMUNITY): Admission: RE | Admit: 2019-01-25 | Payer: BLUE CROSS/BLUE SHIELD | Source: Ambulatory Visit

## 2019-01-25 NOTE — Telephone Encounter (Signed)
Pt called. Her  headache is 3/10 ,"better ,no nausea". She is going to r/s her scan to later this week.

## 2019-01-25 NOTE — Telephone Encounter (Signed)
Legrand Como called to report pt woke up with nausea and headache. No vomiting. She was told to go to ED. Pt wanted to wait it out". I returned his call and Brailynn is sleeping now.

## 2019-01-25 NOTE — Telephone Encounter (Signed)
LVM for pt -she May take prednisone as usual on day of scan.

## 2019-01-25 NOTE — Telephone Encounter (Signed)
Prednisone 20 mg day . Per Julien Nordmann I instructed her to continue on prednisone taper as directed until he gets lab results.

## 2019-01-26 ENCOUNTER — Inpatient Hospital Stay: Payer: BLUE CROSS/BLUE SHIELD | Admitting: Physician Assistant

## 2019-01-31 ENCOUNTER — Ambulatory Visit (HOSPITAL_COMMUNITY)
Admission: RE | Admit: 2019-01-31 | Discharge: 2019-01-31 | Disposition: A | Discharge status: Home / Self Care | Payer: BLUE CROSS/BLUE SHIELD | Source: Ambulatory Visit | Attending: Internal Medicine | Admitting: Internal Medicine

## 2019-01-31 ENCOUNTER — Inpatient Hospital Stay: Payer: BLUE CROSS/BLUE SHIELD | Attending: Internal Medicine

## 2019-01-31 DIAGNOSIS — C7802 Secondary malignant neoplasm of left lung: Secondary | ICD-10-CM | POA: Diagnosis not present

## 2019-01-31 DIAGNOSIS — Z923 Personal history of irradiation: Secondary | ICD-10-CM | POA: Diagnosis not present

## 2019-01-31 DIAGNOSIS — K7589 Other specified inflammatory liver diseases: Secondary | ICD-10-CM | POA: Diagnosis not present

## 2019-01-31 DIAGNOSIS — Z79899 Other long term (current) drug therapy: Secondary | ICD-10-CM | POA: Diagnosis not present

## 2019-01-31 DIAGNOSIS — C7800 Secondary malignant neoplasm of unspecified lung: Secondary | ICD-10-CM | POA: Diagnosis not present

## 2019-01-31 DIAGNOSIS — C439 Malignant melanoma of skin, unspecified: Secondary | ICD-10-CM | POA: Insufficient documentation

## 2019-01-31 DIAGNOSIS — Z5112 Encounter for antineoplastic immunotherapy: Secondary | ICD-10-CM | POA: Insufficient documentation

## 2019-01-31 DIAGNOSIS — C7931 Secondary malignant neoplasm of brain: Secondary | ICD-10-CM | POA: Insufficient documentation

## 2019-01-31 DIAGNOSIS — R5382 Chronic fatigue, unspecified: Secondary | ICD-10-CM

## 2019-01-31 LAB — CBC WITH DIFFERENTIAL (CANCER CENTER ONLY)
Abs Immature Granulocytes: 0.04 10*3/uL (ref 0.00–0.07)
Basophils Absolute: 0 10*3/uL (ref 0.0–0.1)
Basophils Relative: 0 %
Eosinophils Absolute: 0 10*3/uL (ref 0.0–0.5)
Eosinophils Relative: 0 %
HCT: 44.6 % (ref 36.0–46.0)
Hemoglobin: 14.8 g/dL (ref 12.0–15.0)
Immature Granulocytes: 1 %
Lymphocytes Relative: 5 %
Lymphs Abs: 0.4 10*3/uL — ABNORMAL LOW (ref 0.7–4.0)
MCH: 30.5 pg (ref 26.0–34.0)
MCHC: 33.2 g/dL (ref 30.0–36.0)
MCV: 92 fL (ref 80.0–100.0)
Monocytes Absolute: 0.6 10*3/uL (ref 0.1–1.0)
Monocytes Relative: 7 %
Neutro Abs: 7.7 10*3/uL (ref 1.7–7.7)
Neutrophils Relative %: 87 %
Platelet Count: 285 10*3/uL (ref 150–400)
RBC: 4.85 MIL/uL (ref 3.87–5.11)
RDW: 13.7 % (ref 11.5–15.5)
WBC Count: 8.7 10*3/uL (ref 4.0–10.5)
nRBC: 0 % (ref 0.0–0.2)

## 2019-01-31 LAB — CMP (CANCER CENTER ONLY)
ALT: 93 U/L — ABNORMAL HIGH (ref 0–44)
AST: 62 U/L — ABNORMAL HIGH (ref 15–41)
Albumin: 3.8 g/dL (ref 3.5–5.0)
Alkaline Phosphatase: 120 U/L (ref 38–126)
Anion gap: 8 (ref 5–15)
BUN: 15 mg/dL (ref 6–20)
CO2: 27 mmol/L (ref 22–32)
Calcium: 9 mg/dL (ref 8.9–10.3)
Chloride: 106 mmol/L (ref 98–111)
Creatinine: 1.08 mg/dL — ABNORMAL HIGH (ref 0.44–1.00)
GFR, Est AFR Am: >60 mL/min (ref >60)
GFR, Estimated: >60 mL/min (ref >60)
Glucose, Bld: 93 mg/dL (ref 70–99)
Potassium: 4.3 mmol/L (ref 3.5–5.1)
Sodium: 141 mmol/L (ref 135–145)
Total Bilirubin: 0.4 mg/dL (ref 0.3–1.2)
Total Protein: 6.9 g/dL (ref 6.5–8.1)

## 2019-01-31 LAB — LACTATE DEHYDROGENASE: LDH: 261 U/L — ABNORMAL HIGH (ref 98–192)

## 2019-01-31 LAB — TSH: TSH: 2.078 u[IU]/mL (ref 0.308–3.960)

## 2019-01-31 MED ORDER — SODIUM CHLORIDE (PF) 0.9 % IJ SOLN
INTRAMUSCULAR | Status: AC
  Filled 2019-01-31: qty 50

## 2019-01-31 MED ORDER — IOHEXOL 300 MG/ML  SOLN
100.0000 mL | Freq: Once | INTRAMUSCULAR | Status: AC | PRN
  Administered 2019-01-31: 100 mL via INTRAVENOUS

## 2019-02-02 ENCOUNTER — Telehealth: Payer: Self-pay | Admitting: Internal Medicine

## 2019-02-02 ENCOUNTER — Other Ambulatory Visit: Payer: Self-pay

## 2019-02-02 ENCOUNTER — Inpatient Hospital Stay (HOSPITAL_BASED_OUTPATIENT_CLINIC_OR_DEPARTMENT_OTHER): Payer: BLUE CROSS/BLUE SHIELD | Admitting: Physician Assistant

## 2019-02-02 ENCOUNTER — Encounter: Payer: Self-pay | Admitting: Physician Assistant

## 2019-02-02 VITALS — BP 130/74 | HR 76 | Temp 98.0°F | Resp 18 | Ht 69.0 in | Wt 148.6 lb

## 2019-02-02 DIAGNOSIS — K7589 Other specified inflammatory liver diseases: Secondary | ICD-10-CM

## 2019-02-02 DIAGNOSIS — Z923 Personal history of irradiation: Secondary | ICD-10-CM | POA: Diagnosis not present

## 2019-02-02 DIAGNOSIS — Z5112 Encounter for antineoplastic immunotherapy: Secondary | ICD-10-CM | POA: Diagnosis not present

## 2019-02-02 DIAGNOSIS — Z79899 Other long term (current) drug therapy: Secondary | ICD-10-CM

## 2019-02-02 DIAGNOSIS — C7802 Secondary malignant neoplasm of left lung: Secondary | ICD-10-CM | POA: Diagnosis not present

## 2019-02-02 DIAGNOSIS — C7931 Secondary malignant neoplasm of brain: Secondary | ICD-10-CM

## 2019-02-02 DIAGNOSIS — C439 Malignant melanoma of skin, unspecified: Secondary | ICD-10-CM | POA: Diagnosis not present

## 2019-02-02 NOTE — Telephone Encounter (Signed)
Gave avs and calendar, declined printout

## 2019-02-02 NOTE — Progress Notes (Addendum)
[place] OFFICE PROGRESS NOTE  System, Pcp Not In No address on file  DIAGNOSIS: Metastatic high-grade neoplasm consistent with metastatic malignant melanoma based on the most recent pathology report from [facility] in October 2019, presented with large left left upper/left lower lobe mass with a hypermetabolic AP window lymph node as well as solitary metastatic brain lesion diagnosed in May 2019.  Biomarker Findings Microsatellite status - MS-Stable Tumor Mutational Burden - TMB-Intermediate (16 Muts/Mb) Genomic Findings For a complete list of the genes assayed, please refer to the Appendix. CD274 (PD-L1) amplification NRAS Q61R PDCD1LG2 (PD-L2) amplification MYC amplification EPHB1 amplification JAK2 amplification RB1 Q93*, J856* TERT promoter -146C>T TP53 C275W   PRIOR THERAPY:  1) left temporal craniotomy with resection of tumor with intraoperative stereotactic guidance for volumetric resection under the care of Dr. Annette Stable on 04/29/2018. 2) First-line treatment with immunotherapy with Keytruda 200 mg IV every 3 weeks.  First dose June 02, 2018.  Status post 3 cycles.  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 3) palliative radiotherapy to the left lung mass under the care of Dr. Lisbeth Renshaw completed on 08/24/2018. 4) Resuming her treatment again with Keytruda 200 mg IV every 3 weeks, first dose 09/08/2018.  Status post 2 cycles.  CURRENT THERAPY: Treatment with immunotherapy with ipilimumab 3 mg/KG and nivolumab 1 mg/KG every 3 weeks for the first 4 cycles followed by maintenance nivolumab 240 mg IV every 2 weeks.  First dose of this treatment October 26, 2018.  Status post 2 cycles. 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   INTERVAL HISTORY: Leslie [name] 43 y.o. female returns to the clinic today accompanied by her significant other. The patient's treatment has been on hold secondary to  immunotherapy mediated hepatitis for which she was started on a 60 mg tapering dose of prednisone. She is currently on 10 mg of her tapering dose. The patient is feeling well today without any concerns or complaints. She denies any fever, chills, weight loss, or night sweats. She denies any chest pain, shortness of breath, hemoptysis, or cough. She denies any nausea, vomiting, diarrhea, or constipation. She reports to having a headache last week but denies any episodes since that time. She denies any visual changes. She denies any rash or skin changes. She recently had a repeat CT scan of her chest performed and she is here to discuss the results and the treatment options.    MEDICAL HISTORY: Past Medical History:  Diagnosis Date  . Migraines   . Yeast infection     ALLERGIES:  has No Known Allergies.  MEDICATIONS:  Current Outpatient Medications  Medication Sig Dispense Refill  . Ascorbic Acid (LIQUID C 500) 500 MG/15ML LIQD Take 1,000 mg by mouth daily.    . Cholecalciferol 1000 UNIT/10ML LIQD Take by mouth.    . Doxylamine Succinate, Sleep, (UNISOM PO) Take by mouth.    Marland Kitchen GAMMA AMINOBUTYRIC ACID PO Take by mouth.    Marland Kitchen HCA CALCIUM-MAGNESIUM-ZINC PO Take by mouth. Taking one half tablespoon daily    . Melatonin 5 MG CAPS Take by mouth.    . Multiple Vitamin (MULTIVITAMIN) tablet Take 1 tablet by mouth daily.    . Omega 3-6-9 Fatty Acids (OMEGA 3-6-9 COMPLEX PO) Take 1 capsule by mouth daily.    Marland Kitchen OVER THE COUNTER MEDICATION     . predniSONE (DELTASONE) 10 MG tablet Take 1 tablet by mouth as directed.    . prochlorperazine (COMPAZINE) 10 MG tablet Take 1  tablet (10 mg total) by mouth every 6 (six) hours as needed for nausea or vomiting. 30 tablet 0  . temazepam (RESTORIL) 15 MG capsule Take 1 capsule (15 mg total) by mouth at bedtime as needed for sleep. 20 capsule 0   No current facility-administered medications for this visit.     SURGICAL HISTORY:  Past Surgical History:   Procedure Laterality Date  . APPLICATION OF CRANIAL NAVIGATION Left 04/29/2018   Procedure: APPLICATION OF CRANIAL NAVIGATION;  Surgeon: Earnie Larsson, MD;  Location: [place];  Service: Neurosurgery;  Laterality: Left;  . CRANIOTOMY Left 04/29/2018   Procedure: LEFT CRANIOTOMY FOR  TUMOR BRAIN LAB;  Surgeon: Earnie Larsson, MD;  Location: [place];  Service: Neurosurgery;  Laterality: Left;    REVIEW OF SYSTEMS:   Review of Systems  Constitutional: Negative for appetite change, chills, fatigue, fever and unexpected weight change.  HENT:   Negative for mouth sores, nosebleeds, sore throat and trouble swallowing.   Eyes: Negative for eye problems and icterus.  Respiratory: Negative for cough, hemoptysis, shortness of breath and wheezing.   Cardiovascular: Negative for chest pain and leg swelling.  Gastrointestinal: Negative for abdominal pain, constipation, diarrhea, nausea and vomiting.  Genitourinary: Negative for bladder incontinence, difficulty urinating, dysuria, frequency and hematuria.   Musculoskeletal: Negative for back pain, gait problem, neck pain and neck stiffness.  Skin: Negative for itching and rash.  Neurological: Negative for dizziness, extremity weakness, gait problem, headaches, light-headedness and seizures.  Hematological: Negative for adenopathy. Does not bruise/bleed easily.  Psychiatric/Behavioral: Negative for confusion, depression and sleep disturbance. The patient is not nervous/anxious.     PHYSICAL EXAMINATION:  Blood pressure 130/74, pulse 76, temperature 98 F (36.7 C), temperature source Oral, resp. rate 18, height 5' 9"  (1.753 m), weight 148 lb 9.6 oz (67.4 kg), last menstrual period 01/24/2019, SpO2 100 %, unknown if currently breastfeeding.  ECOG PERFORMANCE STATUS: 0 - Asymptomatic  Physical Exam  Constitutional: Oriented to person, place, and time and well-developed, well-nourished, and in no distress. No distress.  HENT:  Head: Normocephalic and atraumatic.   Mouth/Throat: Oropharynx is clear and moist. No oropharyngeal exudate.  Eyes: Conjunctivae are normal. Right eye exhibits no discharge. Left eye exhibits no discharge. No scleral icterus.  Neck: Normal range of motion. Neck supple.  Cardiovascular: Normal rate, regular rhythm, normal heart sounds and intact distal pulses.   Pulmonary/Chest: Effort normal and breath sounds normal. No respiratory distress. No wheezes. No rales.  Abdominal: Soft. Bowel sounds are normal. Exhibits no distension and no mass. There is no tenderness.  Musculoskeletal: Normal range of motion. Exhibits no edema.  Lymphadenopathy:    No cervical adenopathy.  Neurological: Alert and oriented to person, place, and time. Exhibits normal muscle tone. Gait normal. Coordination normal.  Skin: Skin is warm and dry. No rash noted. Not diaphoretic. No erythema. No pallor.  Psychiatric: Mood, memory and judgment normal.  Vitals reviewed.  LABORATORY DATA: Lab Results  Component Value Date   WBC 8.7 01/31/2019   HGB 14.8 01/31/2019   HCT 44.6 01/31/2019   MCV 92.0 01/31/2019   PLT 285 01/31/2019      Chemistry      Component Value Date/Time   NA 141 01/31/2019 1338   K 4.3 01/31/2019 1338   CL 106 01/31/2019 1338   CO2 27 01/31/2019 1338   BUN 15 01/31/2019 1338   CREATININE 1.08 (H) 01/31/2019 1338      Component Value Date/Time   CALCIUM 9.0 01/31/2019 1338  ALKPHOS 120 01/31/2019 1338   AST 62 (H) 01/31/2019 1338   ALT 93 (H) 01/31/2019 1338   BILITOT 0.4 01/31/2019 1338       RADIOGRAPHIC STUDIES:  Ct Chest W Contrast  Result Date: 02/01/2019 CLINICAL DATA:  Stage IV melanoma diagnosed last May. Brain metastasis. Lung and lymph node metastasis. Surgery and radiation therapy to brain. Immunotherapy. EXAM: CT CHEST, ABDOMEN, AND PELVIS WITH CONTRAST TECHNIQUE: Multidetector CT imaging of the chest, abdomen and pelvis was performed following the standard protocol during bolus administration of  intravenous contrast. CONTRAST:  138m OMNIPAQUE IOHEXOL 300 MG/ML  SOLN COMPARISON:  10/18/2018 FINDINGS: CT CHEST FINDINGS Cardiovascular: Normal caliber of the aorta and branch vessels. Normal heart size, without pericardial effusion. No central pulmonary embolism, on this non-dedicated study. Mediastinum/Nodes: No supraclavicular adenopathy. No axillary adenopathy. No mediastinal or hilar adenopathy. Mild residual thymic tissue in the anterior mediastinum. Lungs/Pleura: Minimal left pleural thickening. Mild motion degradation superiorly. Similar 5 mm left apical pulmonary nodule on image 29/4. Worsened left lower lobe and lingular volume loss with airspace disease. The lingular mass with central necrosis and intimate association to the pericardium measures 5.8 x 4.2 cm on image 40/2. Compare 6.6 x 5.0 cm on the prior exam. 5.2 cm craniocaudal today versus 6.7 cm on the prior (when remeasured). Musculoskeletal: No acute osseous abnormality. CT ABDOMEN PELVIS FINDINGS Hepatobiliary: Normal liver. Normal gallbladder, without biliary ductal dilatation. Pancreas: Normal, without mass or ductal dilatation. Spleen: Normal in size, without focal abnormality. Adrenals/Urinary Tract: Normal adrenal glands. Normal kidneys, without hydronephrosis. Normal urinary bladder. Stomach/Bowel: Normal stomach, without wall thickening. Normal colon and terminal ileum. The proximal transverse duodenum is mildly prominent, including on image 73/2. No cause identified. Small bowel loops are otherwise normal in caliber. Vascular/Lymphatic: Normal caliber of the aorta and branch vessels. No abdominopelvic adenopathy. Reproductive: Normal uterus and adnexa. Other: Trace free pelvic fluid is likely physiologic. No abdominal ascites. No evidence of omental or peritoneal disease. Musculoskeletal: Tiny pelvic sclerotic lesions are similar and likely bone islands. IMPRESSION: 1. Response to therapy, as evidenced by decreased size of a mass  within the left hemithorax, intimately associated with the pericardium. 2. Worsened lingular and left lower lobe aeration with increased volume loss. Pulmonary opacities are favored to represent atelectasis. Especially in the left lower lobe, infection cannot be excluded. Correlate with infectious symptoms. 3. Motion degraded evaluation of the chest. Left apical pulmonary nodule is similar. 4. No findings of metastatic disease in the abdomen or pelvis. Electronically Signed   By: KAbigail MiyamotoM.D.   On: 02/01/2019 09:46   Ct Abdomen Pelvis W Contrast  Result Date: 02/01/2019 CLINICAL DATA:  Stage IV melanoma diagnosed last May. Brain metastasis. Lung and lymph node metastasis. Surgery and radiation therapy to brain. Immunotherapy. EXAM: CT CHEST, ABDOMEN, AND PELVIS WITH CONTRAST TECHNIQUE: Multidetector CT imaging of the chest, abdomen and pelvis was performed following the standard protocol during bolus administration of intravenous contrast. CONTRAST:  104mOMNIPAQUE IOHEXOL 300 MG/ML  SOLN COMPARISON:  10/18/2018 FINDINGS: CT CHEST FINDINGS Cardiovascular: Normal caliber of the aorta and branch vessels. Normal heart size, without pericardial effusion. No central pulmonary embolism, on this non-dedicated study. Mediastinum/Nodes: No supraclavicular adenopathy. No axillary adenopathy. No mediastinal or hilar adenopathy. Mild residual thymic tissue in the anterior mediastinum. Lungs/Pleura: Minimal left pleural thickening. Mild motion degradation superiorly. Similar 5 mm left apical pulmonary nodule on image 29/4. Worsened left lower lobe and lingular volume loss with airspace disease. The lingular mass with central  necrosis and intimate association to the pericardium measures 5.8 x 4.2 cm on image 40/2. Compare 6.6 x 5.0 cm on the prior exam. 5.2 cm craniocaudal today versus 6.7 cm on the prior (when remeasured). Musculoskeletal: No acute osseous abnormality. CT ABDOMEN PELVIS FINDINGS Hepatobiliary: Normal  liver. Normal gallbladder, without biliary ductal dilatation. Pancreas: Normal, without mass or ductal dilatation. Spleen: Normal in size, without focal abnormality. Adrenals/Urinary Tract: Normal adrenal glands. Normal kidneys, without hydronephrosis. Normal urinary bladder. Stomach/Bowel: Normal stomach, without wall thickening. Normal colon and terminal ileum. The proximal transverse duodenum is mildly prominent, including on image 73/2. No cause identified. Small bowel loops are otherwise normal in caliber. Vascular/Lymphatic: Normal caliber of the aorta and branch vessels. No abdominopelvic adenopathy. Reproductive: Normal uterus and adnexa. Other: Trace free pelvic fluid is likely physiologic. No abdominal ascites. No evidence of omental or peritoneal disease. Musculoskeletal: Tiny pelvic sclerotic lesions are similar and likely bone islands. IMPRESSION: 1. Response to therapy, as evidenced by decreased size of a mass within the left hemithorax, intimately associated with the pericardium. 2. Worsened lingular and left lower lobe aeration with increased volume loss. Pulmonary opacities are favored to represent atelectasis. Especially in the left lower lobe, infection cannot be excluded. Correlate with infectious symptoms. 3. Motion degraded evaluation of the chest. Left apical pulmonary nodule is similar. 4. No findings of metastatic disease in the abdomen or pelvis. Electronically Signed   By: Abigail Miyamoto M.D.   On: 02/01/2019 09:46     ASSESSMENT/PLAN:  This is a very pleasant 43 year old Caucasian female with metastatic malignant melanoma who presented with large mass in the left upper/left lower lobe with mediastinal lymphadenopathy as well as solitary brain metastasis. She is status post left temporal craniotomy and resection of tumor on 04/29/2018 and she has recovered well from her surgery. The patient completed stereotactic radiotherapy to the resection cavity under the care of Dr. Lisbeth Renshaw. The  patient underwent treatment with immunotherapy with Keytruda 200 mg IV every 3 weeks status post 3 cycles.   Her scan after cycle #3 showed enlargement of the left upper lobe lung mass. This was also suspicious for pseudo-progression on immunotherapy.  The patient was started on a palliative course of radiotherapy to the left upper lobe lung mass and she tolerated this treatment fairly well.  She was seen by Dr. Emelda Brothers at [facility] and she recommended for the patient to resume her treatment with [facility] for now until she undergoes further molecular studies. The patient was treated with 2 more cycles of Keytruda and tolerated the treatment well. Her recent CT scan of the chest, abdomen and pelvis showed some improvement in the left upper lobe lung mass but there is still concern about pericardial invasion. She is not a good candidate for surgical resection due to pericardial invasion.   The final pathology report and recommendation from [facility] was consistent with metastatic melanoma and it was recommendated to switch the patient to a combination immunotherapy with Ipilumumab and nivolumab. The patient is currently undergoing treatment with immunotherapy with ipilimumab and nivolumab status post 2 cycles.  However, treatment has been on hold secondary to the patient developing immunotherapy mediated hepatitis. The patient was placed on a high dose of tapering prednisone.   The patient recently had a restaging CT chest, abdomen, and pelvis. The patient was seen with Dr. Julien Nordmann today. Dr. Julien Nordmann personally and independently  reviewed the scans which showed a decrease in size of the mass within the hemithorax. The  results were discussed with the patient and her significant other today.   Due to the adverse side effects of combined immunotherapy, Dr. Julien Nordmann recommended single agent treatment with nivolumab 1 mg/KG every 3 weeks and to discontinue the ipilimumab.  The patient is  in agreement with this plan and is interested in continuing treatment with nivolumab. The side effect profile was reviewed with the patient including by not limited to an immunotherapy mediated skin rash, diarrhea, inflammation of the lung, kidney, liver, thyroid or other endocrine dysfunction. The patient is expected resume with treatment next week.   Labs were reviewed with the patient today which showed a significant improvement in her LFTs from prior. We will continue to monitor her labs weekly to ensure that her LFTs continue to trend down.   The patient will return for evaluation in 4 weeks prior to starting cycle #2.   The patient was advised to call immediately if she has any concerning symptoms in the interval. The patient voices understanding of current disease status and treatment options and is in agreement with the current care plan. All questions were answered. The patient knows to call the clinic with any problems, questions or concerns. We can certainly see the patient much sooner if necessary   Orders Placed This Encounter  Procedures  . CMP ([facility] only)    Standing Status:   Standing    Number of Occurrences:   12    Standing Expiration Date:   02/03/2020  . CBC with Differential (Cancer Center Only)    Standing Status:   Standing    Number of Occurrences:   12    Standing Expiration Date:   02/03/2020     Tobe Sos Kessler Kopinski, PA-C 02/02/19  ADDENDUM: Hematology/Oncology Attending: I had a face-to-face encounter with the patient.  I recommended her care plan.  This is a very pleasant 43 years old white female with metastatic melanoma.  She was recently treated with immunotherapy with combination of ipilimumab and nivolumab status post 2 cycles but unfortunately her treatment was discontinued secondary to significant liver dysfunction requiring treatment with high-dose prednisone with a tapering schedule over the last 6 weeks. Repeat blood work today showed  significant improvement in her liver enzymes. She had repeat CT scan of the chest, abdomen and pelvis that showed further mild decrease in the large left lung mass but with persistent invasion and contact of the pericardium. I recommended for the patient to resume her treatment with immunotherapy but only with single agent nivolumab every 2 weeks as maintenance therapy.  She is at high risk for recurrence of immunotherapy mediated hepatitis if we resumed the treatment with the combination of ipilimumab and nivolumab but this could be less with a single agent nivolumab but not completely excluded. She is expected to start the first cycle of this treatment next week. She will come back for follow-up visit in 3 weeks for evaluation with the start of cycle #2. The patient was advised to call immediately if she has any concerning symptoms in the interval.  Disclaimer: This note was dictated with voice recognition software. Similar sounding words can inadvertently be transcribed and may be missed upon review. Eilleen Kempf, MD 02/05/19

## 2019-02-03 ENCOUNTER — Telehealth: Payer: Self-pay | Admitting: *Deleted

## 2019-02-03 NOTE — Telephone Encounter (Signed)
Received vm message from patient . She is inquiiring about an impression listed on her recent chest CT scan. TCT patient and spoke with her. Reviewed that aspect of scan with her  2. Worsened lingular and left lower lobe aeration with increased volume loss. Pulmonary opacities are favored to represent atelectasis. Especially in the left lower lobe, infection cannot be excluded. Correlate with infectious symptoms  Explained meaning of atelectasis, volume loss and correlating to any new symptoms she might have related to her respiratory function.  Pt denies any fever, chills, cough, chest pain.  She states she does have ongoing 'pressure' type symptoms in that area of her chest, correlating that to her tumor location.  This is not a new symptom and does not affect her day to day life-she is just aware of it. Pt voiced understanding of this portion of her scan. No further questions or concerns.  She voices understanding to cal with any new symptoms, should they arise.

## 2019-02-05 ENCOUNTER — Other Ambulatory Visit: Payer: Self-pay | Admitting: Internal Medicine

## 2019-02-08 ENCOUNTER — Inpatient Hospital Stay: Payer: BLUE CROSS/BLUE SHIELD

## 2019-02-08 ENCOUNTER — Inpatient Hospital Stay: Payer: BLUE CROSS/BLUE SHIELD | Admitting: Internal Medicine

## 2019-02-08 VITALS — BP 121/77 | HR 73 | Temp 98.3°F | Resp 20 | Wt 149.8 lb

## 2019-02-08 DIAGNOSIS — C7931 Secondary malignant neoplasm of brain: Secondary | ICD-10-CM | POA: Diagnosis not present

## 2019-02-08 DIAGNOSIS — C7802 Secondary malignant neoplasm of left lung: Secondary | ICD-10-CM | POA: Diagnosis not present

## 2019-02-08 DIAGNOSIS — Z923 Personal history of irradiation: Secondary | ICD-10-CM | POA: Diagnosis not present

## 2019-02-08 DIAGNOSIS — C439 Malignant melanoma of skin, unspecified: Secondary | ICD-10-CM | POA: Diagnosis not present

## 2019-02-08 DIAGNOSIS — Z79899 Other long term (current) drug therapy: Secondary | ICD-10-CM | POA: Diagnosis not present

## 2019-02-08 DIAGNOSIS — K7589 Other specified inflammatory liver diseases: Secondary | ICD-10-CM | POA: Diagnosis not present

## 2019-02-08 DIAGNOSIS — Z5112 Encounter for antineoplastic immunotherapy: Secondary | ICD-10-CM | POA: Diagnosis not present

## 2019-02-08 LAB — CMP (CANCER CENTER ONLY)
ALT: 104 U/L — ABNORMAL HIGH (ref 0–44)
AST: 79 U/L — ABNORMAL HIGH (ref 15–41)
Albumin: 3.5 g/dL (ref 3.5–5.0)
Alkaline Phosphatase: 109 U/L (ref 38–126)
Anion gap: 7 (ref 5–15)
BUN: 11 mg/dL (ref 6–20)
CO2: 27 mmol/L (ref 22–32)
Calcium: 9.2 mg/dL (ref 8.9–10.3)
Chloride: 106 mmol/L (ref 98–111)
Creatinine: 0.94 mg/dL (ref 0.44–1.00)
GFR, Est AFR Am: >60 mL/min (ref >60)
GFR, Estimated: >60 mL/min (ref >60)
Glucose, Bld: 82 mg/dL (ref 70–99)
Potassium: 4 mmol/L (ref 3.5–5.1)
Sodium: 140 mmol/L (ref 135–145)
Total Bilirubin: 0.5 mg/dL (ref 0.3–1.2)
Total Protein: 6.5 g/dL (ref 6.5–8.1)

## 2019-02-08 LAB — CBC WITH DIFFERENTIAL (CANCER CENTER ONLY)
Abs Immature Granulocytes: 0.04 10*3/uL (ref 0.00–0.07)
Basophils Absolute: 0.1 10*3/uL (ref 0.0–0.1)
Basophils Relative: 1 %
Eosinophils Absolute: 0 10*3/uL (ref 0.0–0.5)
Eosinophils Relative: 0 %
HCT: 42.1 % (ref 36.0–46.0)
Hemoglobin: 14.1 g/dL (ref 12.0–15.0)
Immature Granulocytes: 0 %
Lymphocytes Relative: 7 %
Lymphs Abs: 0.7 10*3/uL (ref 0.7–4.0)
MCH: 30.9 pg (ref 26.0–34.0)
MCHC: 33.5 g/dL (ref 30.0–36.0)
MCV: 92.1 fL (ref 80.0–100.0)
Monocytes Absolute: 0.9 10*3/uL (ref 0.1–1.0)
Monocytes Relative: 9 %
Neutro Abs: 8.9 10*3/uL — ABNORMAL HIGH (ref 1.7–7.7)
Neutrophils Relative %: 83 %
Platelet Count: 333 10*3/uL (ref 150–400)
RBC: 4.57 MIL/uL (ref 3.87–5.11)
RDW: 13.9 % (ref 11.5–15.5)
WBC Count: 10.6 10*3/uL — ABNORMAL HIGH (ref 4.0–10.5)
nRBC: 0 % (ref 0.0–0.2)

## 2019-02-08 MED ORDER — SODIUM CHLORIDE 0.9 % IV SOLN
Freq: Once | INTRAVENOUS | Status: AC
  Administered 2019-02-08: 12:00:00 via INTRAVENOUS
  Filled 2019-02-08: qty 250

## 2019-02-08 MED ORDER — SODIUM CHLORIDE 0.9 % IV SOLN
240.0000 mg | Freq: Once | INTRAVENOUS | Status: AC
  Administered 2019-02-08: 240 mg via INTRAVENOUS
  Filled 2019-02-08: qty 24

## 2019-02-08 NOTE — Patient Instructions (Signed)
[facility] Discharge Instructions for Patients Receiving Chemotherapy  Today you received the following chemotherapy agents Nivolumab (OPDIVO).  To help prevent nausea and vomiting after your treatment, we encourage you to take your nausea medication as prescribed.   If you develop nausea and vomiting that is not controlled by your nausea medication, call the clinic.   BELOW ARE SYMPTOMS THAT SHOULD BE REPORTED IMMEDIATELY:  *FEVER GREATER THAN 100.5 F  *CHILLS WITH OR WITHOUT FEVER  NAUSEA AND VOMITING THAT IS NOT CONTROLLED WITH YOUR NAUSEA MEDICATION  *UNUSUAL SHORTNESS OF BREATH  *UNUSUAL BRUISING OR BLEEDING  TENDERNESS IN MOUTH AND THROAT WITH OR WITHOUT PRESENCE OF ULCERS  *URINARY PROBLEMS  *BOWEL PROBLEMS  UNUSUAL RASH Items with * indicate a potential emergency and should be followed up as soon as possible.  Feel free to call the clinic should you have any questions or concerns. The clinic phone number is [phone].  Please show the [facility] at check-in to the Emergency Department and triage nurse.

## 2019-02-08 NOTE — Progress Notes (Signed)
Per Dr. Julien Nordmann, Okay to treat with ALT of 104.

## 2019-02-13 DIAGNOSIS — M2560 Stiffness of unspecified joint, not elsewhere classified: Secondary | ICD-10-CM | POA: Diagnosis not present

## 2019-02-13 DIAGNOSIS — M9901 Segmental and somatic dysfunction of cervical region: Secondary | ICD-10-CM | POA: Diagnosis not present

## 2019-02-13 DIAGNOSIS — M9903 Segmental and somatic dysfunction of lumbar region: Secondary | ICD-10-CM | POA: Diagnosis not present

## 2019-02-13 DIAGNOSIS — M62838 Other muscle spasm: Secondary | ICD-10-CM | POA: Diagnosis not present

## 2019-02-15 ENCOUNTER — Inpatient Hospital Stay: Payer: BLUE CROSS/BLUE SHIELD | Attending: Internal Medicine

## 2019-02-15 DIAGNOSIS — Z5112 Encounter for antineoplastic immunotherapy: Secondary | ICD-10-CM | POA: Diagnosis not present

## 2019-02-15 DIAGNOSIS — C7802 Secondary malignant neoplasm of left lung: Secondary | ICD-10-CM | POA: Diagnosis not present

## 2019-02-15 DIAGNOSIS — C7931 Secondary malignant neoplasm of brain: Secondary | ICD-10-CM | POA: Insufficient documentation

## 2019-02-15 DIAGNOSIS — C3432 Malignant neoplasm of lower lobe, left bronchus or lung: Secondary | ICD-10-CM | POA: Insufficient documentation

## 2019-02-15 DIAGNOSIS — Z79899 Other long term (current) drug therapy: Secondary | ICD-10-CM | POA: Diagnosis not present

## 2019-02-15 LAB — CBC WITH DIFFERENTIAL (CANCER CENTER ONLY)
Abs Immature Granulocytes: 0.01 10*3/uL (ref 0.00–0.07)
Basophils Absolute: 0 10*3/uL (ref 0.0–0.1)
Basophils Relative: 1 %
Eosinophils Absolute: 0 10*3/uL (ref 0.0–0.5)
Eosinophils Relative: 0 %
HCT: 43 % (ref 36.0–46.0)
Hemoglobin: 14 g/dL (ref 12.0–15.0)
Immature Granulocytes: 0 %
Lymphocytes Relative: 13 %
Lymphs Abs: 0.6 10*3/uL — ABNORMAL LOW (ref 0.7–4.0)
MCH: 30.4 pg (ref 26.0–34.0)
MCHC: 32.6 g/dL (ref 30.0–36.0)
MCV: 93.3 fL (ref 80.0–100.0)
Monocytes Absolute: 0.8 10*3/uL (ref 0.1–1.0)
Monocytes Relative: 17 %
Neutro Abs: 3.1 10*3/uL (ref 1.7–7.7)
Neutrophils Relative %: 69 %
Platelet Count: 297 10*3/uL (ref 150–400)
RBC: 4.61 MIL/uL (ref 3.87–5.11)
RDW: 14.1 % (ref 11.5–15.5)
WBC Count: 4.5 10*3/uL (ref 4.0–10.5)
nRBC: 0 % (ref 0.0–0.2)

## 2019-02-15 LAB — CMP (CANCER CENTER ONLY)
ALT: 122 U/L — ABNORMAL HIGH (ref 0–44)
AST: 92 U/L — ABNORMAL HIGH (ref 15–41)
Albumin: 3.4 g/dL — ABNORMAL LOW (ref 3.5–5.0)
Alkaline Phosphatase: 99 U/L (ref 38–126)
Anion gap: 7 (ref 5–15)
BUN: 13 mg/dL (ref 6–20)
CO2: 27 mmol/L (ref 22–32)
Calcium: 9.1 mg/dL (ref 8.9–10.3)
Chloride: 105 mmol/L (ref 98–111)
Creatinine: 0.96 mg/dL (ref 0.44–1.00)
GFR, Est AFR Am: >60 mL/min
GFR, Estimated: >60 mL/min
Glucose, Bld: 81 mg/dL (ref 70–99)
Potassium: 4.2 mmol/L (ref 3.5–5.1)
Sodium: 139 mmol/L (ref 135–145)
Total Bilirubin: 0.5 mg/dL (ref 0.3–1.2)
Total Protein: 6.6 g/dL (ref 6.5–8.1)

## 2019-02-21 NOTE — Progress Notes (Signed)
Updated FMLA paperwork to allow patient to return to work for a few 1/2 days during the week, as patient health permits. Successfully faxed to HR Department at [phone].

## 2019-02-22 ENCOUNTER — Inpatient Hospital Stay: Payer: BLUE CROSS/BLUE SHIELD

## 2019-02-22 ENCOUNTER — Other Ambulatory Visit: Payer: Self-pay

## 2019-02-22 DIAGNOSIS — Z5112 Encounter for antineoplastic immunotherapy: Secondary | ICD-10-CM | POA: Diagnosis not present

## 2019-02-22 DIAGNOSIS — C3432 Malignant neoplasm of lower lobe, left bronchus or lung: Secondary | ICD-10-CM | POA: Diagnosis not present

## 2019-02-22 DIAGNOSIS — C7802 Secondary malignant neoplasm of left lung: Secondary | ICD-10-CM

## 2019-02-22 DIAGNOSIS — R5382 Chronic fatigue, unspecified: Secondary | ICD-10-CM

## 2019-02-22 DIAGNOSIS — C7931 Secondary malignant neoplasm of brain: Secondary | ICD-10-CM | POA: Diagnosis not present

## 2019-02-22 DIAGNOSIS — Z79899 Other long term (current) drug therapy: Secondary | ICD-10-CM | POA: Diagnosis not present

## 2019-02-22 LAB — CBC WITH DIFFERENTIAL (CANCER CENTER ONLY)
Abs Immature Granulocytes: 0.01 10*3/uL (ref 0.00–0.07)
Basophils Absolute: 0 10*3/uL (ref 0.0–0.1)
Basophils Relative: 1 %
Eosinophils Absolute: 0 10*3/uL (ref 0.0–0.5)
Eosinophils Relative: 0 %
HCT: 44.9 % (ref 36.0–46.0)
Hemoglobin: 14.6 g/dL (ref 12.0–15.0)
Immature Granulocytes: 0 %
Lymphocytes Relative: 17 %
Lymphs Abs: 0.8 10*3/uL (ref 0.7–4.0)
MCH: 30.5 pg (ref 26.0–34.0)
MCHC: 32.5 g/dL (ref 30.0–36.0)
MCV: 93.9 fL (ref 80.0–100.0)
Monocytes Absolute: 0.7 10*3/uL (ref 0.1–1.0)
Monocytes Relative: 17 %
Neutro Abs: 2.8 10*3/uL (ref 1.7–7.7)
Neutrophils Relative %: 65 %
Platelet Count: 267 10*3/uL (ref 150–400)
RBC: 4.78 MIL/uL (ref 3.87–5.11)
RDW: 14 % (ref 11.5–15.5)
WBC Count: 4.4 10*3/uL (ref 4.0–10.5)
nRBC: 0 % (ref 0.0–0.2)

## 2019-02-22 LAB — CMP (CANCER CENTER ONLY)
ALT: 133 U/L — ABNORMAL HIGH (ref 0–44)
AST: 91 U/L — ABNORMAL HIGH (ref 15–41)
Albumin: 3.8 g/dL (ref 3.5–5.0)
Alkaline Phosphatase: 110 U/L (ref 38–126)
Anion gap: 9 (ref 5–15)
BUN: 13 mg/dL (ref 6–20)
CO2: 27 mmol/L (ref 22–32)
Calcium: 9.5 mg/dL (ref 8.9–10.3)
Chloride: 106 mmol/L (ref 98–111)
Creatinine: 1.06 mg/dL — ABNORMAL HIGH (ref 0.44–1.00)
GFR, Est AFR Am: >60 mL/min (ref >60)
GFR, Estimated: >60 mL/min (ref >60)
Glucose, Bld: 60 mg/dL — ABNORMAL LOW (ref 70–99)
Potassium: 4.7 mmol/L (ref 3.5–5.1)
Sodium: 142 mmol/L (ref 135–145)
Total Bilirubin: 0.6 mg/dL (ref 0.3–1.2)
Total Protein: 7.1 g/dL (ref 6.5–8.1)

## 2019-02-22 LAB — TSH: TSH: 3.408 u[IU]/mL (ref 0.308–3.960)

## 2019-02-24 ENCOUNTER — Telehealth: Payer: Self-pay | Admitting: *Deleted

## 2019-02-24 NOTE — Telephone Encounter (Signed)
Received vm message from patient requesting a call back.   TCT patient. She is requesting an addendum/update to her FMLA to include that she may also continue to work from home. Patient may return to the office for a few 1/2 days when the work environment is less likely to put patient at risk for infection. This is due to the current concerns regarding Covid-19 virus .

## 2019-03-01 ENCOUNTER — Ambulatory Visit: Payer: BLUE CROSS/BLUE SHIELD | Admitting: Internal Medicine

## 2019-03-01 ENCOUNTER — Other Ambulatory Visit: Payer: Self-pay

## 2019-03-01 ENCOUNTER — Other Ambulatory Visit: Payer: BLUE CROSS/BLUE SHIELD

## 2019-03-01 ENCOUNTER — Inpatient Hospital Stay (HOSPITAL_BASED_OUTPATIENT_CLINIC_OR_DEPARTMENT_OTHER): Payer: BLUE CROSS/BLUE SHIELD | Admitting: Internal Medicine

## 2019-03-01 ENCOUNTER — Inpatient Hospital Stay: Payer: BLUE CROSS/BLUE SHIELD

## 2019-03-01 ENCOUNTER — Encounter: Payer: Self-pay | Admitting: Internal Medicine

## 2019-03-01 VITALS — BP 112/74 | HR 66 | Temp 98.4°F | Resp 18 | Ht 69.0 in | Wt 147.5 lb

## 2019-03-01 DIAGNOSIS — C7802 Secondary malignant neoplasm of left lung: Secondary | ICD-10-CM

## 2019-03-01 DIAGNOSIS — Z5112 Encounter for antineoplastic immunotherapy: Secondary | ICD-10-CM | POA: Diagnosis not present

## 2019-03-01 DIAGNOSIS — C3432 Malignant neoplasm of lower lobe, left bronchus or lung: Secondary | ICD-10-CM | POA: Diagnosis not present

## 2019-03-01 DIAGNOSIS — Z79899 Other long term (current) drug therapy: Secondary | ICD-10-CM | POA: Diagnosis not present

## 2019-03-01 DIAGNOSIS — C7931 Secondary malignant neoplasm of brain: Secondary | ICD-10-CM | POA: Diagnosis not present

## 2019-03-01 LAB — CMP (CANCER CENTER ONLY)
ALT: 119 U/L — ABNORMAL HIGH (ref 0–44)
AST: 78 U/L — ABNORMAL HIGH (ref 15–41)
Albumin: 3.5 g/dL (ref 3.5–5.0)
Alkaline Phosphatase: 95 U/L (ref 38–126)
Anion gap: 11 (ref 5–15)
BUN: 14 mg/dL (ref 6–20)
CO2: 23 mmol/L (ref 22–32)
Calcium: 8.9 mg/dL (ref 8.9–10.3)
Chloride: 106 mmol/L (ref 98–111)
Creatinine: 0.88 mg/dL (ref 0.44–1.00)
GFR, Est AFR Am: >60 mL/min (ref >60)
GFR, Estimated: >60 mL/min (ref >60)
Glucose, Bld: 84 mg/dL (ref 70–99)
Potassium: 4 mmol/L (ref 3.5–5.1)
Sodium: 140 mmol/L (ref 135–145)
Total Bilirubin: 0.5 mg/dL (ref 0.3–1.2)
Total Protein: 6.4 g/dL — ABNORMAL LOW (ref 6.5–8.1)

## 2019-03-01 LAB — CBC WITH DIFFERENTIAL (CANCER CENTER ONLY)
Abs Immature Granulocytes: 0 10*3/uL (ref 0.00–0.07)
Basophils Absolute: 0 10*3/uL (ref 0.0–0.1)
Basophils Relative: 1 %
Eosinophils Absolute: 0 10*3/uL (ref 0.0–0.5)
Eosinophils Relative: 0 %
HCT: 43.3 % (ref 36.0–46.0)
Hemoglobin: 14.2 g/dL (ref 12.0–15.0)
Immature Granulocytes: 0 %
Lymphocytes Relative: 20 %
Lymphs Abs: 0.6 10*3/uL — ABNORMAL LOW (ref 0.7–4.0)
MCH: 30.9 pg (ref 26.0–34.0)
MCHC: 32.8 g/dL (ref 30.0–36.0)
MCV: 94.1 fL (ref 80.0–100.0)
Monocytes Absolute: 0.6 10*3/uL (ref 0.1–1.0)
Monocytes Relative: 18 %
Neutro Abs: 2 10*3/uL (ref 1.7–7.7)
Neutrophils Relative %: 61 %
Platelet Count: 255 10*3/uL (ref 150–400)
RBC: 4.6 MIL/uL (ref 3.87–5.11)
RDW: 13.7 % (ref 11.5–15.5)
WBC Count: 3.2 10*3/uL — ABNORMAL LOW (ref 4.0–10.5)
nRBC: 0 % (ref 0.0–0.2)

## 2019-03-01 MED ORDER — SODIUM CHLORIDE 0.9 % IV SOLN
240.0000 mg | Freq: Once | INTRAVENOUS | Status: AC
  Administered 2019-03-01: 240 mg via INTRAVENOUS
  Filled 2019-03-01: qty 20

## 2019-03-01 MED ORDER — SODIUM CHLORIDE 0.9 % IV SOLN
Freq: Once | INTRAVENOUS | Status: AC
  Administered 2019-03-01: 10:00:00 via INTRAVENOUS
  Filled 2019-03-01: qty 250

## 2019-03-01 NOTE — Progress Notes (Signed)
[place] [phone]   [phone]  OFFICE PROGRESS NOTE  System, Pcp Not In No address on file  DIAGNOSIS: Metastatic high-grade neoplasm consistent with metastatic malignant melanoma based on the most recent pathology report from [facility] in October 2019, presented with large left left upper/left lower lobe mass with a hypermetabolic AP window lymph node as well as solitary metastatic brain lesion diagnosed in May 2019.  Biomarker Findings Microsatellite status - MS-Stable Tumor Mutational Burden - TMB-Intermediate (16 Muts/Mb) Genomic Findings For a complete list of the genes assayed, please refer to the Appendix. CD274 (PD-L1) amplification NRAS Q61R PDCD1LG2 (PD-L2) amplification MYC amplification EPHB1 amplification JAK2 amplification RB1 Q93*, B284* TERT promoter -146C>T TP53 C275W   PRIOR THERAPY: 1) left temporal craniotomy with resection of tumor with intraoperative stereotactic guidance for volumetric resection under the care of Dr. Annette Stable on 04/29/2018. 2) First-line treatment with immunotherapy with Keytruda 200 mg IV every 3 weeks.  First dose June 02, 2018.  Status post 3 cycles.  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 3) palliative radiotherapy to the left lung mass under the care of Dr. Lisbeth Renshaw completed on 08/24/2018. 4) Resuming her treatment again with Keytruda 200 mg IV every 3 weeks, first dose 09/08/2018.  Status post 2 cycles.  CURRENT THERAPY: Treatment with immunotherapy with ipilimumab 3 mg/KG and nivolumab 1 mg/KG every 3 weeks for the first 4 cycles followed by maintenance nivolumab 240 mg IV every 2 weeks.  First dose of this treatment October 26, 2018.  Status post 3 cycles.  Ipilimumab was discontinued after cycle #2 for the significant liver dysfunction.  Starting from cycle #3 the patient is on treatment with single agent nivolumab.  INTERVAL  HISTORY: Leslie [name] 43 y.o. female returns to the clinic today for follow-up visit.  The patient is feeling fine today with no concerning complaints.  She tolerated the first cycle of her treatment with nivolumab fairly well.  She denied having any chest pain, shortness of breath, cough or hemoptysis.  She denied having any fever or chills.  She has no nausea, vomiting, diarrhea or constipation.  She denied having any skin rash.  She is here today for evaluation before restarting the second cycle of her maintenance nivolumab.  MEDICAL HISTORY: Past Medical History:  Diagnosis Date   Migraines    Yeast infection     ALLERGIES:  has No Known Allergies.  MEDICATIONS:  Current Outpatient Medications  Medication Sig Dispense Refill   Ascorbic Acid (LIQUID C 500) 500 MG/15ML LIQD Take 1,000 mg by mouth daily.     Cholecalciferol 1000 UNIT/10ML LIQD Take by mouth.     Doxylamine Succinate, Sleep, (UNISOM PO) Take by mouth.     GAMMA AMINOBUTYRIC ACID PO Take by mouth.     HCA CALCIUM-MAGNESIUM-ZINC PO Take by mouth. Taking one half tablespoon daily     Melatonin 5 MG CAPS Take by mouth.     Multiple Vitamin (MULTIVITAMIN) tablet Take 1 tablet by mouth daily.     Omega 3-6-9 Fatty Acids (OMEGA 3-6-9 COMPLEX PO) Take 1 capsule by mouth daily.     OVER THE COUNTER MEDICATION      predniSONE (DELTASONE) 10 MG tablet Take 1 tablet by mouth as directed.     prochlorperazine (COMPAZINE) 10 MG tablet Take 1 tablet (10 mg total) by mouth every 6 (six) hours as needed for nausea or vomiting. 30 tablet 0   temazepam (RESTORIL) 15 MG  capsule Take 1 capsule (15 mg total) by mouth at bedtime as needed for sleep. 20 capsule 0   No current facility-administered medications for this visit.     SURGICAL HISTORY:  Past Surgical History:  Procedure Laterality Date   APPLICATION OF CRANIAL NAVIGATION Left 04/29/2018   Procedure: APPLICATION OF CRANIAL NAVIGATION;  Surgeon: Earnie Larsson,  MD;  Location: [facility];  Service: Neurosurgery;  Laterality: Left;   CRANIOTOMY Left 04/29/2018   Procedure: LEFT CRANIOTOMY FOR  TUMOR BRAIN LAB;  Surgeon: Earnie Larsson, MD;  Location: [facility];  Service: Neurosurgery;  Laterality: Left;    REVIEW OF SYSTEMS:  A comprehensive review of systems was negative.   PHYSICAL EXAMINATION: General appearance: alert, cooperative and no distress Head: Normocephalic, without obvious abnormality, atraumatic Neck: no adenopathy, no JVD, supple, symmetrical, trachea midline and thyroid not enlarged, symmetric, no tenderness/mass/nodules Lymph nodes: Cervical, supraclavicular, and axillary nodes normal. Resp: clear to auscultation bilaterally Back: negative, symmetric, no curvature. ROM normal. No CVA tenderness. Cardio: regular rate and rhythm, S1, S2 normal, no murmur, click, rub or gallop GI: soft, non-tender; bowel sounds normal; no masses,  no organomegaly Extremities: extremities normal, atraumatic, no cyanosis or edema  ECOG PERFORMANCE STATUS: 0 - Asymptomatic  Blood pressure 112/74, pulse 66, temperature 98.4 F (36.9 C), temperature source Oral, resp. rate 18, height 5' 9"  (1.753 m), weight 147 lb 8 oz (66.9 kg), SpO2 100 %, unknown if currently breastfeeding.  LABORATORY DATA: Lab Results  Component Value Date   WBC 3.2 (L) 03/01/2019   HGB 14.2 03/01/2019   HCT 43.3 03/01/2019   MCV 94.1 03/01/2019   PLT 255 03/01/2019      Chemistry      Component Value Date/Time   NA 142 02/22/2019 0831   K 4.7 02/22/2019 0831   CL 106 02/22/2019 0831   CO2 27 02/22/2019 0831   BUN 13 02/22/2019 0831   CREATININE 1.06 (H) 02/22/2019 0831      Component Value Date/Time   CALCIUM 9.5 02/22/2019 0831   ALKPHOS 110 02/22/2019 0831   AST 91 (H) 02/22/2019 0831   ALT 133 (H) 02/22/2019 0831   BILITOT 0.6 02/22/2019 0831       RADIOGRAPHIC STUDIES: Ct Chest W Contrast  Result Date: 02/01/2019 CLINICAL DATA:  Stage IV melanoma diagnosed last  May. Brain metastasis. Lung and lymph node metastasis. Surgery and radiation therapy to brain. Immunotherapy. EXAM: CT CHEST, ABDOMEN, AND PELVIS WITH CONTRAST TECHNIQUE: Multidetector CT imaging of the chest, abdomen and pelvis was performed following the standard protocol during bolus administration of intravenous contrast. CONTRAST:  136m OMNIPAQUE IOHEXOL 300 MG/ML  SOLN COMPARISON:  10/18/2018 FINDINGS: CT CHEST FINDINGS Cardiovascular: Normal caliber of the aorta and branch vessels. Normal heart size, without pericardial effusion. No central pulmonary embolism, on this non-dedicated study. Mediastinum/Nodes: No supraclavicular adenopathy. No axillary adenopathy. No mediastinal or hilar adenopathy. Mild residual thymic tissue in the anterior mediastinum. Lungs/Pleura: Minimal left pleural thickening. Mild motion degradation superiorly. Similar 5 mm left apical pulmonary nodule on image 29/4. Worsened left lower lobe and lingular volume loss with airspace disease. The lingular mass with central necrosis and intimate association to the pericardium measures 5.8 x 4.2 cm on image 40/2. Compare 6.6 x 5.0 cm on the prior exam. 5.2 cm craniocaudal today versus 6.7 cm on the prior (when remeasured). Musculoskeletal: No acute osseous abnormality. CT ABDOMEN PELVIS FINDINGS Hepatobiliary: Normal liver. Normal gallbladder, without biliary ductal dilatation. Pancreas: Normal, without mass or ductal dilatation. Spleen:  Normal in size, without focal abnormality. Adrenals/Urinary Tract: Normal adrenal glands. Normal kidneys, without hydronephrosis. Normal urinary bladder. Stomach/Bowel: Normal stomach, without wall thickening. Normal colon and terminal ileum. The proximal transverse duodenum is mildly prominent, including on image 73/2. No cause identified. Small bowel loops are otherwise normal in caliber. Vascular/Lymphatic: Normal caliber of the aorta and branch vessels. No abdominopelvic adenopathy. Reproductive: Normal  uterus and adnexa. Other: Trace free pelvic fluid is likely physiologic. No abdominal ascites. No evidence of omental or peritoneal disease. Musculoskeletal: Tiny pelvic sclerotic lesions are similar and likely bone islands. IMPRESSION: 1. Response to therapy, as evidenced by decreased size of a mass within the left hemithorax, intimately associated with the pericardium. 2. Worsened lingular and left lower lobe aeration with increased volume loss. Pulmonary opacities are favored to represent atelectasis. Especially in the left lower lobe, infection cannot be excluded. Correlate with infectious symptoms. 3. Motion degraded evaluation of the chest. Left apical pulmonary nodule is similar. 4. No findings of metastatic disease in the abdomen or pelvis. Electronically Signed   By: Abigail Miyamoto M.D.   On: 02/01/2019 09:46   Ct Abdomen Pelvis W Contrast  Result Date: 02/01/2019 CLINICAL DATA:  Stage IV melanoma diagnosed last May. Brain metastasis. Lung and lymph node metastasis. Surgery and radiation therapy to brain. Immunotherapy. EXAM: CT CHEST, ABDOMEN, AND PELVIS WITH CONTRAST TECHNIQUE: Multidetector CT imaging of the chest, abdomen and pelvis was performed following the standard protocol during bolus administration of intravenous contrast. CONTRAST:  177m OMNIPAQUE IOHEXOL 300 MG/ML  SOLN COMPARISON:  10/18/2018 FINDINGS: CT CHEST FINDINGS Cardiovascular: Normal caliber of the aorta and branch vessels. Normal heart size, without pericardial effusion. No central pulmonary embolism, on this non-dedicated study. Mediastinum/Nodes: No supraclavicular adenopathy. No axillary adenopathy. No mediastinal or hilar adenopathy. Mild residual thymic tissue in the anterior mediastinum. Lungs/Pleura: Minimal left pleural thickening. Mild motion degradation superiorly. Similar 5 mm left apical pulmonary nodule on image 29/4. Worsened left lower lobe and lingular volume loss with airspace disease. The lingular mass with  central necrosis and intimate association to the pericardium measures 5.8 x 4.2 cm on image 40/2. Compare 6.6 x 5.0 cm on the prior exam. 5.2 cm craniocaudal today versus 6.7 cm on the prior (when remeasured). Musculoskeletal: No acute osseous abnormality. CT ABDOMEN PELVIS FINDINGS Hepatobiliary: Normal liver. Normal gallbladder, without biliary ductal dilatation. Pancreas: Normal, without mass or ductal dilatation. Spleen: Normal in size, without focal abnormality. Adrenals/Urinary Tract: Normal adrenal glands. Normal kidneys, without hydronephrosis. Normal urinary bladder. Stomach/Bowel: Normal stomach, without wall thickening. Normal colon and terminal ileum. The proximal transverse duodenum is mildly prominent, including on image 73/2. No cause identified. Small bowel loops are otherwise normal in caliber. Vascular/Lymphatic: Normal caliber of the aorta and branch vessels. No abdominopelvic adenopathy. Reproductive: Normal uterus and adnexa. Other: Trace free pelvic fluid is likely physiologic. No abdominal ascites. No evidence of omental or peritoneal disease. Musculoskeletal: Tiny pelvic sclerotic lesions are similar and likely bone islands. IMPRESSION: 1. Response to therapy, as evidenced by decreased size of a mass within the left hemithorax, intimately associated with the pericardium. 2. Worsened lingular and left lower lobe aeration with increased volume loss. Pulmonary opacities are favored to represent atelectasis. Especially in the left lower lobe, infection cannot be excluded. Correlate with infectious symptoms. 3. Motion degraded evaluation of the chest. Left apical pulmonary nodule is similar. 4. No findings of metastatic disease in the abdomen or pelvis. Electronically Signed   By: KAbigail MiyamotoM.D.   On:  02/01/2019 09:46    ASSESSMENT AND PLAN: This is a very pleasant 43 years old white female with highly suspicious metastatic malignant melanoma presented with large mass in the left upper/left  lower lobe and mediastinal lymphadenopathy as well as solitary brain metastasis status post left temporal craniotomy and resection of tumor on 04/29/2018 and she is recovering well from her surgery. The patient completed stereotactic radiotherapy to the resection cavity next week under the care of Dr. Lisbeth Renshaw. The patient underwent treatment with immunotherapy with Keytruda 200 mg IV every 3 weeks status post 3 cycles.   Her scan after cycle #3 showed enlargement of the left upper lobe lung mass.  This was also suspicious for pseudo-progression on immunotherapy.  The patient was started on a palliative course of radiotherapy to the left upper lobe lung mass and she tolerated this treatment fairly well.  She was seen by Dr. Emelda Brothers at [facility] and she recommended for the patient to resume her treatment with [facility] for now until she undergoes further molecular studies.  The patient was treated with 2 more cycles of Keytruda and tolerated the treatment well. Her recent CT scan of the chest, abdomen and pelvis showed some improvement in the left upper lobe lung mass but there is still concern about pericardial invasion. I personally and independently reviewed the scan images and discussed the result and showed the images to the patient and her boyfriend today.  She is still not a good candidate for surgical resection because of the pericardial invasion. The final pathology report and recommendation from [facility] was consistent with metastatic melanoma and the recommendation is to switch the patient to a combination immunotherapy with Ipilumumab and nivolumab. The patient is currently undergoing treatment with immunotherapy with ipilimumab and nivolumab status post 2 cycles.  Her treatment was on hold for more than 6 weeks secondary to grade 3 hepatic dysfunction.  She had improvement of her liver enzyme after a prolonged treatment with a steroid with a tapering schedule. The patient  was a started 3 weeks ago on single agent nivolumab and tolerated this treatment well. I recommended for her to proceed with the second cycle of her maintenance nivolumab today as scheduled. I will see her back for follow-up visit in 3 weeks for evaluation before starting cycle #3. The patient was advised to call immediately if she has any concerning symptoms in the interval. The patient voices understanding of current disease status and treatment options and is in agreement with the current care plan. All questions were answered. The patient knows to call the clinic with any problems, questions or concerns. We can certainly see the patient much sooner if necessary.  Disclaimer: This note was dictated with voice recognition software. Similar sounding words can inadvertently be transcribed and may not be corrected upon review.

## 2019-03-01 NOTE — Patient Instructions (Signed)
[place] Discharge Instructions for Patients Receiving Chemotherapy  Today you received the following chemotherapy agents Nivolumab  To help prevent nausea and vomiting after your treatment, we encourage you to take your nausea medication as directed  If you develop nausea and vomiting that is not controlled by your nausea medication, call the clinic.   BELOW ARE SYMPTOMS THAT SHOULD BE REPORTED IMMEDIATELY:  *FEVER GREATER THAN 100.5 F  *CHILLS WITH OR WITHOUT FEVER  NAUSEA AND VOMITING THAT IS NOT CONTROLLED WITH YOUR NAUSEA MEDICATION  *UNUSUAL SHORTNESS OF BREATH  *UNUSUAL BRUISING OR BLEEDING  TENDERNESS IN MOUTH AND THROAT WITH OR WITHOUT PRESENCE OF ULCERS  *URINARY PROBLEMS  *BOWEL PROBLEMS  UNUSUAL RASH Items with * indicate a potential emergency and should be followed up as soon as possible.  Feel free to call the clinic should you have any questions or concerns. The clinic phone number is [phone].  Please show the [facility] at check-in to the Emergency Department and triage nurse.

## 2019-03-01 NOTE — Progress Notes (Signed)
Okay to treat- Per Dr Julien Nordmann it is okay to treat pt today with Opdivo and ALT of 119.

## 2019-03-03 ENCOUNTER — Telehealth: Payer: Self-pay | Admitting: Internal Medicine

## 2019-03-03 NOTE — Telephone Encounter (Signed)
It is actually every 2 weeks. It may been a typing error on my side. She can receive Nivo every 2 or every 4. She will every 2 weeks for now. I may change to every 4 weeks in the future. Thank you

## 2019-03-03 NOTE — Telephone Encounter (Signed)
Spoke with patient re next appointment for 4/8. Per 3/18 los return in 3 weeks (4/8) - lab/fu/chemo q3w.  Per patient she was not aware that the plan was changing from treatment q2w and weekly labs. Message to MM to confirm patient treatment regimen and appointment request per 3/18 los.

## 2019-03-07 NOTE — Telephone Encounter (Signed)
Weekly labs- LVM that she does not need weekly labs and her Nivolumab is every 2 weeks.Next should be April 1st. Schedule message sent.

## 2019-03-08 ENCOUNTER — Telehealth: Payer: Self-pay | Admitting: Internal Medicine

## 2019-03-08 ENCOUNTER — Inpatient Hospital Stay: Payer: BLUE CROSS/BLUE SHIELD

## 2019-03-08 NOTE — Telephone Encounter (Signed)
R/s appt per 3/24 sch message - pt is aware of new appt date and time

## 2019-03-15 ENCOUNTER — Telehealth: Payer: Self-pay

## 2019-03-15 ENCOUNTER — Other Ambulatory Visit: Payer: BLUE CROSS/BLUE SHIELD

## 2019-03-15 ENCOUNTER — Inpatient Hospital Stay: Payer: BLUE CROSS/BLUE SHIELD | Attending: Internal Medicine

## 2019-03-15 ENCOUNTER — Encounter: Payer: Self-pay | Admitting: Physician Assistant

## 2019-03-15 ENCOUNTER — Inpatient Hospital Stay: Payer: BLUE CROSS/BLUE SHIELD

## 2019-03-15 ENCOUNTER — Inpatient Hospital Stay (HOSPITAL_BASED_OUTPATIENT_CLINIC_OR_DEPARTMENT_OTHER): Payer: BLUE CROSS/BLUE SHIELD | Admitting: Physician Assistant

## 2019-03-15 ENCOUNTER — Other Ambulatory Visit: Payer: Self-pay

## 2019-03-15 VITALS — BP 125/83 | HR 82 | Temp 98.6°F | Resp 18 | Ht 69.0 in | Wt 149.1 lb

## 2019-03-15 DIAGNOSIS — B370 Candidal stomatitis: Secondary | ICD-10-CM | POA: Insufficient documentation

## 2019-03-15 DIAGNOSIS — C7931 Secondary malignant neoplasm of brain: Secondary | ICD-10-CM | POA: Insufficient documentation

## 2019-03-15 DIAGNOSIS — C3412 Malignant neoplasm of upper lobe, left bronchus or lung: Secondary | ICD-10-CM | POA: Diagnosis not present

## 2019-03-15 DIAGNOSIS — G4700 Insomnia, unspecified: Secondary | ICD-10-CM | POA: Insufficient documentation

## 2019-03-15 DIAGNOSIS — C439 Malignant melanoma of skin, unspecified: Secondary | ICD-10-CM

## 2019-03-15 DIAGNOSIS — Z5112 Encounter for antineoplastic immunotherapy: Secondary | ICD-10-CM | POA: Insufficient documentation

## 2019-03-15 DIAGNOSIS — C787 Secondary malignant neoplasm of liver and intrahepatic bile duct: Secondary | ICD-10-CM | POA: Insufficient documentation

## 2019-03-15 DIAGNOSIS — Z79899 Other long term (current) drug therapy: Secondary | ICD-10-CM

## 2019-03-15 DIAGNOSIS — C7802 Secondary malignant neoplasm of left lung: Secondary | ICD-10-CM | POA: Insufficient documentation

## 2019-03-15 DIAGNOSIS — R5382 Chronic fatigue, unspecified: Secondary | ICD-10-CM

## 2019-03-15 DIAGNOSIS — K7689 Other specified diseases of liver: Secondary | ICD-10-CM | POA: Insufficient documentation

## 2019-03-15 DIAGNOSIS — J029 Acute pharyngitis, unspecified: Secondary | ICD-10-CM | POA: Insufficient documentation

## 2019-03-15 LAB — CMP (CANCER CENTER ONLY)
ALT: 87 U/L — ABNORMAL HIGH (ref 0–44)
AST: 64 U/L — ABNORMAL HIGH (ref 15–41)
Albumin: 3.9 g/dL (ref 3.5–5.0)
Alkaline Phosphatase: 101 U/L (ref 38–126)
Anion gap: 9 (ref 5–15)
BUN: 16 mg/dL (ref 6–20)
CO2: 25 mmol/L (ref 22–32)
Calcium: 9.5 mg/dL (ref 8.9–10.3)
Chloride: 106 mmol/L (ref 98–111)
Creatinine: 1.06 mg/dL — ABNORMAL HIGH (ref 0.44–1.00)
GFR, Est AFR Am: >60 mL/min (ref >60)
GFR, Estimated: >60 mL/min (ref >60)
Glucose, Bld: 87 mg/dL (ref 70–99)
Potassium: 4.5 mmol/L (ref 3.5–5.1)
Sodium: 140 mmol/L (ref 135–145)
Total Bilirubin: 0.5 mg/dL (ref 0.3–1.2)
Total Protein: 7 g/dL (ref 6.5–8.1)

## 2019-03-15 LAB — CBC WITH DIFFERENTIAL (CANCER CENTER ONLY)
Abs Immature Granulocytes: 0.01 10*3/uL (ref 0.00–0.07)
Basophils Absolute: 0 10*3/uL (ref 0.0–0.1)
Basophils Relative: 1 %
Eosinophils Absolute: 0.1 10*3/uL (ref 0.0–0.5)
Eosinophils Relative: 1 %
HCT: 42.2 % (ref 36.0–46.0)
Hemoglobin: 14.3 g/dL (ref 12.0–15.0)
Immature Granulocytes: 0 %
Lymphocytes Relative: 13 %
Lymphs Abs: 0.8 10*3/uL (ref 0.7–4.0)
MCH: 31.1 pg (ref 26.0–34.0)
MCHC: 33.9 g/dL (ref 30.0–36.0)
MCV: 91.7 fL (ref 80.0–100.0)
Monocytes Absolute: 0.7 10*3/uL (ref 0.1–1.0)
Monocytes Relative: 13 %
Neutro Abs: 4.2 10*3/uL (ref 1.7–7.7)
Neutrophils Relative %: 72 %
Platelet Count: 254 10*3/uL (ref 150–400)
RBC: 4.6 MIL/uL (ref 3.87–5.11)
RDW: 13.3 % (ref 11.5–15.5)
WBC Count: 5.8 10*3/uL (ref 4.0–10.5)
nRBC: 0 % (ref 0.0–0.2)

## 2019-03-15 LAB — TSH: TSH: 2.413 u[IU]/mL (ref 0.308–3.960)

## 2019-03-15 MED ORDER — SODIUM CHLORIDE 0.9 % IV SOLN
Freq: Once | INTRAVENOUS | Status: AC
  Administered 2019-03-15: 14:00:00 via INTRAVENOUS
  Filled 2019-03-15: qty 250

## 2019-03-15 MED ORDER — SODIUM CHLORIDE 0.9 % IV SOLN
240.0000 mg | Freq: Once | INTRAVENOUS | Status: AC
  Administered 2019-03-15: 240 mg via INTRAVENOUS
  Filled 2019-03-15: qty 24

## 2019-03-15 NOTE — Telephone Encounter (Signed)
Left voice mail message in regards to follow-up appointment with A. Bruning PA. Advised to call back in regards to appointment on 4/7.

## 2019-03-15 NOTE — Progress Notes (Signed)
[place] OFFICE PROGRESS NOTE  System, Pcp Not In No address on file  DIAGNOSIS: Metastatic high-grade neoplasm consistent with metastatic malignant melanoma based on the most recent pathology report from [facility] in October 2019, presented with large left left upper/left lower lobe mass with a hypermetabolic AP window lymph node as well as solitary metastatic brain lesion diagnosed in May 2019  Biomarker Findings Microsatellite status - MS-Stable Tumor Mutational Burden - TMB-Intermediate (16 Muts/Mb) Genomic Findings For a complete list of the genes assayed, please refer to the Appendix. CD274 (PD-L1) amplification NRAS Q61R PDCD1LG2 (PD-L2) amplification MYC amplification EPHB1 amplification JAK2 amplification RB1 Q93*, D220* TERT promoter -146C>T TP53 C275W   PRIOR THERAPY: 1) left temporal craniotomy with resection of tumor with intraoperative stereotactic guidance for volumetric resection under the care of Dr. Annette Stable on 04/29/2018. 2) First-line treatment with immunotherapy with Keytruda 200 mg IV every 3 weeks.  First dose June 02, 2018.  Status post 3 cycles.  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 3) palliative radiotherapy to the left lung mass under the care of Dr. Lisbeth Renshaw completed on 08/24/2018. 4) Resuming her treatment again with Keytruda 200 mg IV every 3 weeks, first dose 09/08/2018.  Status post 2 cycles.    CURRENT THERAPY:  Immunotherapy with ipilimumab 3 mg/KG and nivolumab 1 mg/KG every 3 weeks for the first 4 cycles followed by maintenance nivolumab 240 mg IV every 2 weeks.  First dose of this treatment October 26, 2018. Status post 4 cycles. Ipilimumab was discontinued after cycle #2 for the significant liver dysfunction.  She is currently on single agent Nivolumab 1 mg/kg IV every 2 weeks. She is status post 2 cycles with single agent Nivolumab.    INTERVAL HISTORY: Leslie [name] 43  y.o. female returns to the clinic for a follow-up visit.  The patient is feeling well today without any concerning complaints except for continued irregular menstruation and bloating. She also mentioned her frequent insomnia for which she takes a half a tablet of unisom. She continues to tolerate single agent nivolumab well.  She denies any fever, chills, night sweats, or weight loss.  She denies any nausea, vomiting, diarrhea, or constipation.  She denies any chest pain, shortness of breath, cough, or hemoptysis. She denies any headaches or visual changes. She is scheduled for a repeat brain MRI later this week and will be following up with Dr. Lisbeth Renshaw and her neurosurgeon next week.  She denies any rashes but does endorse occasional itching.  She is here today for evaluation before starting cycle #3 today of single agent Nivolumab.   MEDICAL HISTORY: Past Medical History:  Diagnosis Date  . Migraines   . Yeast infection     ALLERGIES:  has No Known Allergies.  MEDICATIONS:  Current Outpatient Medications  Medication Sig Dispense Refill  . Ascorbic Acid (LIQUID C 500) 500 MG/15ML LIQD Take 1,000 mg by mouth daily.    . Cholecalciferol 1000 UNIT/10ML LIQD Take by mouth.    . Doxylamine Succinate, Sleep, (UNISOM PO) Take by mouth.    Marland Kitchen GAMMA AMINOBUTYRIC ACID PO Take by mouth.    Marland Kitchen HCA CALCIUM-MAGNESIUM-ZINC PO Take by mouth. Taking one half tablespoon daily    . Melatonin 5 MG CAPS Take by mouth.    . Multiple Vitamin (MULTIVITAMIN) tablet Take 1 tablet by mouth daily.    . Omega 3-6-9 Fatty Acids (OMEGA 3-6-9 COMPLEX PO) Take 1 capsule by mouth daily.    Marland Kitchen OVER THE  COUNTER MEDICATION     . predniSONE (DELTASONE) 10 MG tablet Take 1 tablet by mouth as directed.    . prochlorperazine (COMPAZINE) 10 MG tablet Take 1 tablet (10 mg total) by mouth every 6 (six) hours as needed for nausea or vomiting. 30 tablet 0  . temazepam (RESTORIL) 15 MG capsule Take 1 capsule (15 mg total) by mouth at bedtime  as needed for sleep. 20 capsule 0   No current facility-administered medications for this visit.     SURGICAL HISTORY:  Past Surgical History:  Procedure Laterality Date  . APPLICATION OF CRANIAL NAVIGATION Left 04/29/2018   Procedure: APPLICATION OF CRANIAL NAVIGATION;  Surgeon: Earnie Larsson, MD;  Location: [facility];  Service: Neurosurgery;  Laterality: Left;  . CRANIOTOMY Left 04/29/2018   Procedure: LEFT CRANIOTOMY FOR  TUMOR BRAIN LAB;  Surgeon: Earnie Larsson, MD;  Location: [facility];  Service: Neurosurgery;  Laterality: Left;    REVIEW OF SYSTEMS:   Review of Systems  Constitutional: Negative for appetite change, chills, fatigue, fever and unexpected weight change.  HENT:   Negative for mouth sores, nosebleeds, sore throat and trouble swallowing.   Eyes: Negative for eye problems and icterus.  Respiratory: Negative for cough, hemoptysis, shortness of breath and wheezing.   Cardiovascular: Negative for chest pain and leg swelling.  Gastrointestinal: Negative for abdominal pain, constipation, diarrhea, nausea and vomiting.  Genitourinary: Negative for bladder incontinence, difficulty urinating, dysuria, frequency and hematuria.   Musculoskeletal: Negative for back pain, gait problem, neck pain and neck stiffness.  Skin: Positive for mild itching. Negative for rash.  Neurological: Negative for dizziness, extremity weakness, gait problem, headaches, light-headedness and seizures.  Hematological: Negative for adenopathy. Does not bruise/bleed easily.  Psychiatric/Behavioral: Negative for confusion, depression and sleep disturbance. The patient is not nervous/anxious.     PHYSICAL EXAMINATION:  Blood pressure 125/83, pulse 82, temperature 98.6 F (37 C), temperature source Oral, resp. rate 18, height 5' 9"  (1.753 m), weight 149 lb 1.6 oz (67.6 kg), SpO2 99 %, unknown if currently breastfeeding.  ECOG PERFORMANCE STATUS: 0 - Asymptomatic  Physical Exam  Constitutional: Oriented to person,  place, and time and well-developed, well-nourished, and in no distress. No distress.  HENT:  Head: Normocephalic and atraumatic.  Mouth/Throat: Oropharynx is clear and moist. No oropharyngeal exudate.  Eyes: Conjunctivae are normal. Right eye exhibits no discharge. Left eye exhibits no discharge. No scleral icterus.  Neck: Normal range of motion. Neck supple.  Cardiovascular: Normal rate, regular rhythm, normal heart sounds and intact distal pulses.   Pulmonary/Chest: Effort normal and breath sounds normal. No respiratory distress. No wheezes. No rales.  Abdominal: Soft. Bowel sounds are normal. Exhibits no distension and no mass. There is no tenderness.  Musculoskeletal: Normal range of motion. Exhibits no edema.  Lymphadenopathy:    No cervical adenopathy.  Neurological: Alert and oriented to person, place, and time. Exhibits normal muscle tone. Gait normal. Coordination normal.  Skin: Skin is warm and dry. No rash noted. Not diaphoretic. No erythema. No pallor.  Psychiatric: Mood, memory and judgment normal.  Vitals reviewed.  LABORATORY DATA: Lab Results  Component Value Date   WBC 5.8 03/15/2019   HGB 14.3 03/15/2019   HCT 42.2 03/15/2019   MCV 91.7 03/15/2019   PLT 254 03/15/2019      Chemistry      Component Value Date/Time   NA 140 03/15/2019 1251   K 4.5 03/15/2019 1251   CL 106 03/15/2019 1251   CO2 25 03/15/2019 1251  BUN 16 03/15/2019 1251   CREATININE 1.06 (H) 03/15/2019 1251      Component Value Date/Time   CALCIUM 9.5 03/15/2019 1251   ALKPHOS 101 03/15/2019 1251   AST 64 (H) 03/15/2019 1251   ALT 87 (H) 03/15/2019 1251   BILITOT 0.5 03/15/2019 1251       RADIOGRAPHIC STUDIES:  No results found.   ASSESSMENT/PLAN:  This is a very pleasant 43 year old Caucasian female who presented with a large mass in the left upper/lower lobe and mediastinal lymphadenopathy as well as a solitary brain metastasis highly suspicious for malignant melenoma.  Final  pathology report from [facility] showed her malignancy was consistent with metastatic melanoma.   She is status post a left temporal craniotomy and resection of the tumor on 04/29/2018.  The patient also completed stereotactic radiotherapy to the resection cavity under the care of Dr. Lisbeth Renshaw. She previously underwent treatment with immunotherapy with Keytruda 200 mg IV every 3 weeks.  She is status post 3 cycles. Her scan after cycle #3 showed enlargement of the left upper lobe lung mass.  This was also suspicious for pseudo-progression on immunotherapy.  The patient was started on a palliative course of radiotherapy to the left upper lobe lung mass and she tolerated this treatment fairly well. She was seen by Dr. Emelda Brothers at [facility] and she recommended for the patient to resume her treatment with [facility] until she undergoes further molecular studies.  The patient was treated with 2 more cycles of Keytruda and tolerated the treatment well. The patient had a CT scan performed which showed pericardial invasion.  Therefore, the patient is not a good candidate for surgical resection.   From her final pathology report and recommendation from [facility], she was switched to immunotherapy with ipilimumab and nivolumab status post 2 cycles.  Her treatment was on hold for more than 6 weeks secondary to grade 3 hepatic dysfunction.  She had improvement of her liver enzyme after a prolonged treatment with a steroid with a tapering schedule.   She recently has resumed treatment and is currently undergoing single agent nivolumab 43m/kg IV every 2 weeks.  She is status post 2 cycles with maintenance nivolumab.  She is tolerating treatment well. The patient was seen with Dr. MJulien Nordmanntoday.  Labs were reviewed with the patient. Her liver enzymes continue to trend downwards. We recommend that she proceed with cycle #3 of single agent nivolumab today as scheduled.  We will see her  back in 2 weeks for evaluation prior to starting cycle #4.   She will continue taking 1/2 a tablet of unisom as needed for her insomnia.  She will proceed with her brain MRI later this week and follow up with Dr. MLisbeth Renshawfrom radiation oncology and her neurosurgeon next week.   The patient was advised to call immediately if she has any concerning symptoms in the interval. The patient voices understanding of current disease status and treatment options and is in agreement with the current care plan. All questions were answered. The patient knows to call the clinic with any problems, questions or concerns. We can certainly see the patient much sooner if necessary  No orders of the defined types were placed in this encounter.     L , PA-C 03/15/19  ADDENDUM: Hematology/Oncology Attending: I had a face-to-face encounter with the patient.  I recommended her care plan.  This is a 89years white female with metastatic melanoma currently undergoing treatment with nivolumab every 2 weeks.  She is tolerating this treatment well with no concerning complaints.  Her blood work is optimal for treatment today. I recommended for the patient to continue her maintenance treatment with nivolumab. I will see her back for follow-up visit in 2 weeks for evaluation before the next cycle of her treatment. She was advised to call immediately if she has any concerning symptoms in the interval.  Disclaimer: This note was dictated with voice recognition software. Similar sounding words can inadvertently be transcribed and may be missed upon review. Eilleen Kempf, MD 03/15/19

## 2019-03-15 NOTE — Patient Instructions (Addendum)
[facility] Discharge Instructions for Patients Receiving Chemotherapy  Today you received the following chemotherapy agents Nivolumab (OPDIVO).  To help prevent nausea and vomiting after your treatment, we encourage you to take your nausea medication as prescribed.   If you develop nausea and vomiting that is not controlled by your nausea medication, call the clinic.   BELOW ARE SYMPTOMS THAT SHOULD BE REPORTED IMMEDIATELY:  *FEVER GREATER THAN 100.5 F  *CHILLS WITH OR WITHOUT FEVER  NAUSEA AND VOMITING THAT IS NOT CONTROLLED WITH YOUR NAUSEA MEDICATION  *UNUSUAL SHORTNESS OF BREATH  *UNUSUAL BRUISING OR BLEEDING  TENDERNESS IN MOUTH AND THROAT WITH OR WITHOUT PRESENCE OF ULCERS  *URINARY PROBLEMS  *BOWEL PROBLEMS  UNUSUAL RASH Items with * indicate a potential emergency and should be followed up as soon as possible.  Feel free to call the clinic should you have any questions or concerns. The clinic phone number is [phone].  Please show the [facility] at check-in to the Emergency Department and triage nurse.  Coronavirus (COVID-19) Are you at risk?  Are you at risk for the Coronavirus (COVID-19)?  To be considered HIGH RISK for Coronavirus (COVID-19), you have to meet the following criteria:  . Traveled to [place], [place], [place], [place] or [place]; or in the [place] to [place], [place], [place], or [place]; and have fever, cough, and shortness of breath within the last 2 weeks of travel OR . Been in close contact with a person diagnosed with COVID-19 within the last 2 weeks and have fever, cough, and shortness of breath . IF YOU DO NOT MEET THESE CRITERIA, YOU ARE CONSIDERED LOW RISK FOR COVID-19.  What to do if you are HIGH RISK for COVID-19?  Marland Kitchen If you are having a medical emergency, call 911. . Seek medical care right away. Before you go to a doctor's office, urgent care or emergency department, call ahead and tell them  about your recent travel, contact with someone diagnosed with COVID-19, and your symptoms. You should receive instructions from your physician's office regarding next steps of care.  . When you arrive at healthcare provider, tell the healthcare staff immediately you have returned from visiting [place], [place], [place], [place] or [place]; or traveled in the [place] to [place], [place], [place], or [place]; in the last two weeks or you have been in close contact with a person diagnosed with COVID-19 in the last 2 weeks.   . Tell the health care staff about your symptoms: fever, cough and shortness of breath. . After you have been seen by a medical provider, you will be either: o Tested for (COVID-19) and discharged home on quarantine except to seek medical care if symptoms worsen, and asked to  - Stay home and avoid contact with others until you get your results (4-5 days)  - Avoid travel on public transportation if possible (such as bus, train, or airplane) or o Sent to the Emergency Department by EMS for evaluation, COVID-19 testing, and possible admission depending on your condition and test results.  What to do if you are LOW RISK for COVID-19?  Reduce your risk of any infection by using the same precautions used for avoiding the common cold or flu:  Marland Kitchen Wash your hands often with soap and warm water for at least 20 seconds.  If soap and water are not readily available, use an alcohol-based hand sanitizer with at least 60% alcohol.  . If coughing or  sneezing, cover your mouth and nose by coughing or sneezing into the elbow areas of your shirt or coat, into a tissue or into your sleeve (not your hands). . Avoid shaking hands with others and consider head nods or verbal greetings only. . Avoid touching your eyes, nose, or mouth with unwashed hands.  . Avoid close contact with people who are sick. . Avoid places or events with large numbers of people in one location, like concerts or  sporting events. . Carefully consider travel plans you have or are making. . If you are planning any travel outside or inside the [place], visit the CDC's Travelers' Health webpage for the latest health notices. . If you have some symptoms but not all symptoms, continue to monitor at home and seek medical attention if your symptoms worsen. . If you are having a medical emergency, call 911.   [place] / e-Visit: [email or web address]         [facility]: [facility]: [phone]                   [facility]: [place] Discharge Instructions for Patients Receiving Chemotherapy  Today you received the following chemotherapy agents Opdivo  To help prevent nausea and vomiting after your treatment, we encourage you to take your nausea medication as directed.    If you develop nausea and vomiting that is not controlled by your nausea medication, call the clinic.   BELOW ARE SYMPTOMS THAT SHOULD BE REPORTED IMMEDIATELY:  *FEVER GREATER THAN 100.5 F  *CHILLS WITH OR WITHOUT FEVER  NAUSEA AND VOMITING THAT IS NOT CONTROLLED WITH YOUR NAUSEA MEDICATION  *UNUSUAL SHORTNESS OF BREATH  *UNUSUAL BRUISING OR BLEEDING  TENDERNESS IN MOUTH AND THROAT WITH OR WITHOUT PRESENCE OF ULCERS  *URINARY PROBLEMS  *BOWEL PROBLEMS  UNUSUAL RASH Items with * indicate a potential emergency and should be followed up as soon as possible.  Feel free to call the clinic should you have any questions or concerns. The clinic phone number is [phone].  Please show the [facility] at check-in to the Emergency Department and triage nurse.

## 2019-03-17 ENCOUNTER — Ambulatory Visit
Admission: RE | Admit: 2019-03-17 | Discharge: 2019-03-17 | Disposition: A | Discharge status: Home / Self Care | Payer: BLUE CROSS/BLUE SHIELD | Source: Ambulatory Visit | Attending: Radiation Oncology | Admitting: Radiation Oncology

## 2019-03-17 ENCOUNTER — Other Ambulatory Visit: Payer: Self-pay

## 2019-03-17 DIAGNOSIS — C7931 Secondary malignant neoplasm of brain: Secondary | ICD-10-CM | POA: Diagnosis not present

## 2019-03-17 DIAGNOSIS — C7949 Secondary malignant neoplasm of other parts of nervous system: Principal | ICD-10-CM

## 2019-03-17 MED ORDER — GADOBENATE DIMEGLUMINE 529 MG/ML IV SOLN
13.0000 mL | Freq: Once | INTRAVENOUS | Status: AC | PRN
  Administered 2019-03-17: 13 mL via INTRAVENOUS

## 2019-03-20 ENCOUNTER — Telehealth: Payer: Self-pay | Admitting: Medical Oncology

## 2019-03-20 DIAGNOSIS — J029 Acute pharyngitis, unspecified: Secondary | ICD-10-CM | POA: Diagnosis not present

## 2019-03-20 NOTE — Telephone Encounter (Signed)
Pt  tested positive for Strep + -on amoxicillin x 10 days

## 2019-03-20 NOTE — Telephone Encounter (Signed)
"   I am pretty sure I have strep throat". Per Leslie [name] , I instructed her to see PCP or urgent care.

## 2019-03-21 ENCOUNTER — Ambulatory Visit: Payer: BLUE CROSS/BLUE SHIELD | Admitting: Radiation Oncology

## 2019-03-22 ENCOUNTER — Other Ambulatory Visit: Payer: BLUE CROSS/BLUE SHIELD

## 2019-03-22 ENCOUNTER — Ambulatory Visit: Payer: BLUE CROSS/BLUE SHIELD

## 2019-03-22 ENCOUNTER — Other Ambulatory Visit: Payer: Self-pay | Admitting: Radiation Therapy

## 2019-03-22 ENCOUNTER — Ambulatory Visit: Payer: BLUE CROSS/BLUE SHIELD | Admitting: Physician Assistant

## 2019-03-23 DIAGNOSIS — C7931 Secondary malignant neoplasm of brain: Secondary | ICD-10-CM | POA: Diagnosis not present

## 2019-03-24 ENCOUNTER — Telehealth: Payer: Self-pay | Admitting: *Deleted

## 2019-03-24 NOTE — Telephone Encounter (Signed)
Received vm call from pt stating she is following up on strepB results & no results from [facility] yet.  She reports still taking ATB.  She will f/u again tomorrow.  She is concerned that her next treatment is Wed. & has some concerns about swollen lymph nodes.  She states she had a feeling that tonsils are swollen 3 days after last treatment although she can't really feel anything on the outside.

## 2019-03-27 ENCOUNTER — Ambulatory Visit: Payer: Self-pay | Admitting: Radiation Oncology

## 2019-03-27 ENCOUNTER — Telehealth: Payer: Self-pay | Admitting: Medical Oncology

## 2019-03-27 NOTE — Telephone Encounter (Signed)
Rapid strep was  negative . Pt learned that culture was never sent. Took amoxicillin bid  and she was "getting dizzy from it" so she is only taking one hs. I told pt to contact prescriber about finishing rx. . She says she is feeling better. I told her to keep her appt this week.

## 2019-03-27 NOTE — Telephone Encounter (Signed)
NO answer when I returned call re sore throat

## 2019-03-29 ENCOUNTER — Other Ambulatory Visit: Payer: BLUE CROSS/BLUE SHIELD

## 2019-03-29 ENCOUNTER — Telehealth: Payer: Self-pay | Admitting: Radiation Therapy

## 2019-03-29 ENCOUNTER — Inpatient Hospital Stay (HOSPITAL_BASED_OUTPATIENT_CLINIC_OR_DEPARTMENT_OTHER): Payer: BLUE CROSS/BLUE SHIELD | Admitting: Physician Assistant

## 2019-03-29 ENCOUNTER — Other Ambulatory Visit: Payer: Self-pay

## 2019-03-29 ENCOUNTER — Ambulatory Visit: Payer: BLUE CROSS/BLUE SHIELD | Admitting: Physician Assistant

## 2019-03-29 ENCOUNTER — Inpatient Hospital Stay: Payer: BLUE CROSS/BLUE SHIELD

## 2019-03-29 VITALS — BP 126/80 | HR 89 | Temp 97.9°F | Resp 18 | Ht 69.0 in | Wt 150.6 lb

## 2019-03-29 DIAGNOSIS — C7802 Secondary malignant neoplasm of left lung: Secondary | ICD-10-CM | POA: Diagnosis not present

## 2019-03-29 DIAGNOSIS — K7689 Other specified diseases of liver: Secondary | ICD-10-CM

## 2019-03-29 DIAGNOSIS — C7931 Secondary malignant neoplasm of brain: Secondary | ICD-10-CM

## 2019-03-29 DIAGNOSIS — C3412 Malignant neoplasm of upper lobe, left bronchus or lung: Secondary | ICD-10-CM

## 2019-03-29 DIAGNOSIS — Z79899 Other long term (current) drug therapy: Secondary | ICD-10-CM | POA: Diagnosis not present

## 2019-03-29 DIAGNOSIS — Z5112 Encounter for antineoplastic immunotherapy: Secondary | ICD-10-CM | POA: Diagnosis not present

## 2019-03-29 DIAGNOSIS — G4700 Insomnia, unspecified: Secondary | ICD-10-CM | POA: Diagnosis not present

## 2019-03-29 DIAGNOSIS — B370 Candidal stomatitis: Secondary | ICD-10-CM | POA: Diagnosis not present

## 2019-03-29 DIAGNOSIS — J029 Acute pharyngitis, unspecified: Secondary | ICD-10-CM

## 2019-03-29 DIAGNOSIS — C787 Secondary malignant neoplasm of liver and intrahepatic bile duct: Secondary | ICD-10-CM

## 2019-03-29 LAB — CMP (CANCER CENTER ONLY)
ALT: 85 U/L — ABNORMAL HIGH (ref 0–44)
AST: 66 U/L — ABNORMAL HIGH (ref 15–41)
Albumin: 4 g/dL (ref 3.5–5.0)
Alkaline Phosphatase: 103 U/L (ref 38–126)
Anion gap: 11 (ref 5–15)
BUN: 14 mg/dL (ref 6–20)
CO2: 23 mmol/L (ref 22–32)
Calcium: 9.1 mg/dL (ref 8.9–10.3)
Chloride: 106 mmol/L (ref 98–111)
Creatinine: 0.9 mg/dL (ref 0.44–1.00)
GFR, Est AFR Am: >60 mL/min (ref >60)
GFR, Estimated: >60 mL/min (ref >60)
Glucose, Bld: 96 mg/dL (ref 70–99)
Potassium: 4.1 mmol/L (ref 3.5–5.1)
Sodium: 140 mmol/L (ref 135–145)
Total Bilirubin: 0.5 mg/dL (ref 0.3–1.2)
Total Protein: 7.1 g/dL (ref 6.5–8.1)

## 2019-03-29 LAB — CBC WITH DIFFERENTIAL (CANCER CENTER ONLY)
Abs Immature Granulocytes: 0.01 10*3/uL (ref 0.00–0.07)
Basophils Absolute: 0 10*3/uL (ref 0.0–0.1)
Basophils Relative: 0 %
Eosinophils Absolute: 0 10*3/uL (ref 0.0–0.5)
Eosinophils Relative: 0 %
HCT: 41.7 % (ref 36.0–46.0)
Hemoglobin: 14.1 g/dL (ref 12.0–15.0)
Immature Granulocytes: 0 %
Lymphocytes Relative: 11 %
Lymphs Abs: 0.5 10*3/uL — ABNORMAL LOW (ref 0.7–4.0)
MCH: 31.2 pg (ref 26.0–34.0)
MCHC: 33.8 g/dL (ref 30.0–36.0)
MCV: 92.3 fL (ref 80.0–100.0)
Monocytes Absolute: 0.8 10*3/uL (ref 0.1–1.0)
Monocytes Relative: 18 %
Neutro Abs: 3 10*3/uL (ref 1.7–7.7)
Neutrophils Relative %: 71 %
Platelet Count: 268 10*3/uL (ref 150–400)
RBC: 4.52 MIL/uL (ref 3.87–5.11)
RDW: 12.9 % (ref 11.5–15.5)
WBC Count: 4.3 10*3/uL (ref 4.0–10.5)
nRBC: 0 % (ref 0.0–0.2)

## 2019-03-29 MED ORDER — SODIUM CHLORIDE 0.9 % IV SOLN
240.0000 mg | Freq: Once | INTRAVENOUS | Status: AC
  Administered 2019-03-29: 240 mg via INTRAVENOUS
  Filled 2019-03-29: qty 24

## 2019-03-29 MED ORDER — SODIUM CHLORIDE 0.9 % IV SOLN
Freq: Once | INTRAVENOUS | Status: AC
  Administered 2019-03-29: 14:00:00 via INTRAVENOUS
  Filled 2019-03-29: qty 250

## 2019-03-29 NOTE — Telephone Encounter (Signed)
Left a voicemail letting pt know that we reviewed her recent brain MRI during the [facility] conference on 4/13. The scan is stable with no new lesions or areas for concern. The plan is to continue with active follow-up and re-scan in 3 months.   Next MRI is due early July with a follow-up to see Dr. Ida Rogue PA-C, Shona Simpson.    Mont Dutton R.T.(R)(T) [facility]   Radiation Oncology  Radiation Special Procedures Navigator Office: [phone]   Pager: [phone]  [place] Website: [email or web address]

## 2019-03-29 NOTE — Progress Notes (Signed)
[place] OFFICE PROGRESS NOTE  System, Pcp Not In No address on file  DIAGNOSIS: Metastatic high-grade neoplasm consistent with metastatic malignant melanoma based on the most recent pathology report from [facility] in October 2019, presented with large left left upper/left lower lobe mass with a hypermetabolic AP window lymph node as well as solitary metastatic brain lesion diagnosed in May 2019  Biomarker Findings Microsatellite status - MS-Stable Tumor Mutational Burden - TMB-Intermediate (16 Muts/Mb) Genomic Findings For a complete list of the genes assayed, please refer to the Appendix. CD274 (PD-L1) amplification NRAS Q61R PDCD1LG2 (PD-L2) amplification MYC amplification EPHB1 amplification JAK2 amplification RB1 Q93*, G269* TERT promoter -146C>T TP53 C275W   PRIOR THERAPY:  1) left temporal craniotomy with resection of tumor with intraoperative stereotactic guidance for volumetric resection under the care of Dr. Annette Stable on 04/29/2018. 2) First-line treatment with immunotherapy with Keytruda 200 mg IV every 3 weeks. First dose June 02, 2018. Status post 3 cycles. 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 3) palliative radiotherapy to the left lung mass under the care of Dr. Lisbeth Renshaw completed on 08/24/2018. 4) Resuming her treatment again with Keytruda 200 mg IV every 3 weeks, first dose 09/08/2018. Status post 2 cycles.  CURRENT THERAPY: Immunotherapy with ipilimumab 3 mg/KG and nivolumab 1 mg/KG every 3 weeks for the first 4 cycles followed by maintenance nivolumab 240 mg IV every 2 weeks. First dose of this treatment October 26, 2018. Status post 5cycles.Ipilimumab was discontinued after cycle #2 for the significant liver dysfunction.  She is currently on single agent Nivolumab 1 mg/kg IV every 2 weeks. She is status post 3 cycles with single agent Nivolumab.    INTERVAL HISTORY: Leslie [name] 43  y.o. female returns to the clinic for a follow-up visit.  The patient is feeling well today without any concerning complaints except for a recent pharyngitis. She got evaluated and given a prescription of Augmentin 875 mg BID. Her rapid strep test was negative and her culture was reportedly never sent out. She is still taking the Augmentin; however, it makes her feel dizzy. She is almost finished with her prescription. She denies any fevers, chills, nasal congestion, or anymore throat soreness.   Additionally, she recently followed up with neurosurgeon for her history of a solitary brain metastasis. Her recent MRI did not show any residual or recurrent disease.   She continues to tolerate treatment with single agent nivolumab fairly well.  She denies any fever, chills, night sweats, or weight loss.  She denies any chest pain, shortness of breath, cough, or hemoptysis.  She denies any nausea, vomiting, diarrhea, or constipation.  She denies any headache or visual changes.  She denies any rashes or skin changes. She is today for evaluation before starting cycle #4 of single agent nivolumab.   MEDICAL HISTORY: Past Medical History:  Diagnosis Date  . Migraines   . Yeast infection     ALLERGIES:  has No Known Allergies.  MEDICATIONS:  Current Outpatient Medications  Medication Sig Dispense Refill  . Ascorbic Acid (LIQUID C 500) 500 MG/15ML LIQD Take 1,000 mg by mouth daily.    . Cholecalciferol 1000 UNIT/10ML LIQD Take by mouth.    . Doxylamine Succinate, Sleep, (UNISOM PO) Take by mouth.    Marland Kitchen GAMMA AMINOBUTYRIC ACID PO Take by mouth.    Marland Kitchen HCA CALCIUM-MAGNESIUM-ZINC PO Take by mouth. Taking one half tablespoon daily    . Melatonin 5 MG CAPS Take by mouth.    Marland Kitchen  Multiple Vitamin (MULTIVITAMIN) tablet Take 1 tablet by mouth daily.    . Omega 3-6-9 Fatty Acids (OMEGA 3-6-9 COMPLEX PO) Take 1 capsule by mouth daily.    Marland Kitchen OVER THE COUNTER MEDICATION     . predniSONE (DELTASONE) 10 MG tablet Take 1  tablet by mouth as directed.    . prochlorperazine (COMPAZINE) 10 MG tablet Take 1 tablet (10 mg total) by mouth every 6 (six) hours as needed for nausea or vomiting. 30 tablet 0  . temazepam (RESTORIL) 15 MG capsule Take 1 capsule (15 mg total) by mouth at bedtime as needed for sleep. 20 capsule 0   No current facility-administered medications for this visit.     SURGICAL HISTORY:  Past Surgical History:  Procedure Laterality Date  . APPLICATION OF CRANIAL NAVIGATION Left 04/29/2018   Procedure: APPLICATION OF CRANIAL NAVIGATION;  Surgeon: Earnie Larsson, MD;  Location: [place];  Service: Neurosurgery;  Laterality: Left;  . CRANIOTOMY Left 04/29/2018   Procedure: LEFT CRANIOTOMY FOR  TUMOR BRAIN LAB;  Surgeon: Earnie Larsson, MD;  Location: [facility];  Service: Neurosurgery;  Laterality: Left;    REVIEW OF SYSTEMS:   Review of Systems  Constitutional: Negative for appetite change, chills, fatigue, fever and unexpected weight change.  HENT:  Negative for mouth sores, nosebleeds, sore throat (resolved) and trouble swallowing.   Eyes: Negative for eye problems and icterus.  Respiratory: Negative for cough, hemoptysis, shortness of breath and wheezing.   Cardiovascular: Negative for chest pain and leg swelling.  Gastrointestinal: Negative for abdominal pain, constipation, diarrhea, nausea and vomiting.  Genitourinary: Negative for bladder incontinence, difficulty urinating, dysuria, frequency and hematuria.   Musculoskeletal: Negative for back pain, gait problem, neck pain and neck stiffness.  Skin: Negative for itching and rash.  Neurological: Positive for dizziness when taking Augmentin. Negative for extremity weakness, gait problem, headaches, light-headedness and seizures.  Hematological: Negative for adenopathy. Does not bruise/bleed easily.  Psychiatric/Behavioral: Negative for confusion, depression and sleep disturbance. The patient is not nervous/anxious.     PHYSICAL EXAMINATION:  Blood  pressure 126/80, pulse 89, temperature 97.9 F (36.6 C), temperature source Oral, resp. rate 18, height 5' 9"  (1.753 m), weight 150 lb 9.6 oz (68.3 kg), SpO2 100 %, unknown if currently breastfeeding.  ECOG PERFORMANCE STATUS: 1 - Symptomatic but completely ambulatory  Physical Exam  Constitutional: Oriented to person, place, and time and well-developed, well-nourished, and in no distress. HENT:  Head: Normocephalic and atraumatic.  Mouth/Throat: Oropharynx is clear and moist. No oropharyngeal exudate. No erythema of the oropharynx.  Eyes: Conjunctivae are normal. Right eye exhibits no discharge. Left eye exhibits no discharge. No scleral icterus.  Neck: Normal range of motion. Neck supple.  Cardiovascular: Normal rate, regular rhythm, normal heart sounds and intact distal pulses.   Pulmonary/Chest: Effort normal and breath sounds normal. No respiratory distress. No wheezes. No rales.  Abdominal: Soft. Bowel sounds are normal. Exhibits no distension and no mass. There is no tenderness.  Musculoskeletal: Normal range of motion. Exhibits no edema.  Lymphadenopathy: mild tonsillar lymphadenopathy. No supraclavicular or posterior cervical lymphadenopathy  Neurological: Alert and oriented to person, place, and time. Exhibits normal muscle tone. Gait normal. Coordination normal.  Skin: Skin is warm and dry. No rash noted. Not diaphoretic. No erythema. No pallor.  Psychiatric: Mood, memory and judgment normal.  Vitals reviewed.  LABORATORY DATA: Lab Results  Component Value Date   WBC 4.3 03/29/2019   HGB 14.1 03/29/2019   HCT 41.7 03/29/2019   MCV 92.3  03/29/2019   PLT 268 03/29/2019      Chemistry      Component Value Date/Time   NA 140 03/29/2019 1236   K 4.1 03/29/2019 1236   CL 106 03/29/2019 1236   CO2 23 03/29/2019 1236   BUN 14 03/29/2019 1236   CREATININE 0.90 03/29/2019 1236      Component Value Date/Time   CALCIUM 9.1 03/29/2019 1236   ALKPHOS 103 03/29/2019 1236    AST 66 (H) 03/29/2019 1236   ALT 85 (H) 03/29/2019 1236   BILITOT 0.5 03/29/2019 1236       RADIOGRAPHIC STUDIES:  Mr Jeri Cos SJ Contrast  Result Date: 03/17/2019 CLINICAL DATA:  S RS evaluation. Multiple metastatic lesions. Malignant melanoma. Postop resection of left temporal mass. EXAM: MRI HEAD WITHOUT AND WITH CONTRAST TECHNIQUE: Multiplanar, multiecho pulse sequences of the brain and surrounding structures were obtained without and with intravenous contrast. CONTRAST:  54m MULTIHANCE GADOBENATE DIMEGLUMINE 529 MG/ML IV SOLN COMPARISON:  12/16/2018 FINDINGS: Brain: No evidence of residual or recurrent disease. Previous left temporal craniotomy for tumor resection in the left temporal lobe. Volume loss and gliosis in that region is stable, with linear enhancement along the inferior operative site. No sign of residual or recurrent disease in that location. Elsewhere, the brain shows a few scattered punctate foci of T2 and FLAIR signal within the hemispheric white matter which are stable. No sign of second metastatic lesion. No hemorrhage, hydrocephalus or extra-axial collection. Vascular: Major vessels at the base of the brain show flow. Skull and upper cervical spine: Otherwise negative Sinuses/Orbits: Clear/normal Other: None IMPRESSION: Stable examination. Postoperative changes of left temporal lobe. No evidence of residual or recurrent disease in that location. No second brain lesion identified. Minimal punctate T2 white matter foci are unchanged. Electronically Signed   By: MNelson ChimesM.D.   On: 03/17/2019 15:24     ASSESSMENT/PLAN:  This is a very pleasant 43year old Caucasian female who presented with a large mass in the left upper/lower lobe and mediastinal adenopathy as well as a solitary brain metastasis highly suspicious for malignant melanoma.  [facility].  She is status post a  left temporal craniotomy and resection of the tumor on 04/29/2018. The patient also completed stereotactic radiotherapy to the resection cavity under the care of Dr. MLisbeth Renshaw She previously underwent treatment with immunotherapy with Keytruda 200 mg IV every 3 weeks.  She is status post 3 cycles. CT scan after cycle #3 showed enlargement of the left upper lobe lung mass.  This was suspicious for pseudo-progression on immunotherapy. The patient was started on palliative course of radiotherapy to the left upper lobe mass and she tolerated this treatment fairly well.  [facility]and she [facility] the [facility].  The patient was treated with 2 more cycles of Keytruda and tolerated the treatment well. The patient then had a CT scan performed which showed pericardial invasion. Therefore the patient is not a good candidate for surgical resection.   [facility] the patient was switched to immunotherapy with ipilimumab and nivolumab status post 2 cycles. Treatment was on hold for more than 6 weeks secondary to grade 3 hepatic dysfunction.  She had improvement of her liver enzymes after a prolonged treatment with a tapering schedule of steroids.  She recently resumed treatment and is currently undergoing single agent nivolumab 1 mg/kg IV every 2 weeks.  She is status post 3 cycles with single agent nivolumab.  She has been tolerating treatment well. The patient was seen today with Dr. Julien Nordmann.  Labs were reviewed with the patient.  Her liver enzymes have trended downwards over the last several weeks. Dr. Julien Nordmann recommends that she proceed with cycle #4 of single agent nivolumab today as scheduled.   We will see her back in 2 weeks for evaluation prior to starting cycle #5 of single agent nivolumab.   She will complete  her antibiotic for her recent pharyngitis infection.The patient was advised to call immediately if she has any concerning symptoms in the interval. The patient voices understanding of current disease status and treatment options and is in agreement with the current care plan. All questions were answered. The patient knows to call the clinic with any problems, questions or concerns. We can certainly see the patient much sooner if necessary  No orders of the defined types were placed in this encounter.    Leslie Lennox L Juventino Pavone, PA-C 03/29/19  ADDENDUM: Hematology/Oncology Attending: I had a face-to-face encounter with the patient today.  I recommended her care plan.  This is a very pleasant 43 years old white female with metastatic malignant melanoma and currently undergoing maintenance treatment with nivolumab 240 mg IV every 2 weeks status post 4 cycles. The patient is feeling fine today with no concerning complaints. I recommended for her to proceed with cycle #5 today. I will see her back for follow-up visit in 2 weeks for evaluation before starting cycle #6. We will consider the patient for repeat imaging studies after cycle #6. She was advised to call immediately if she has any concerning symptoms in the interval.  Disclaimer: This note was dictated with voice recognition software. Similar sounding words can inadvertently be transcribed and may be missed upon review. Eilleen Kempf, MD 03/29/19

## 2019-03-29 NOTE — Patient Instructions (Signed)
[place] Discharge Instructions for Patients Receiving Chemotherapy  Today you received the following chemotherapy agents Nivolumab (OPDIVO).  To help prevent nausea and vomiting after your treatment, we encourage you to take your nausea medication as prescribed.   If you develop nausea and vomiting that is not controlled by your nausea medication, call the clinic.   BELOW ARE SYMPTOMS THAT SHOULD BE REPORTED IMMEDIATELY:  *FEVER GREATER THAN 100.5 F  *CHILLS WITH OR WITHOUT FEVER  NAUSEA AND VOMITING THAT IS NOT CONTROLLED WITH YOUR NAUSEA MEDICATION  *UNUSUAL SHORTNESS OF BREATH  *UNUSUAL BRUISING OR BLEEDING  TENDERNESS IN MOUTH AND THROAT WITH OR WITHOUT PRESENCE OF ULCERS  *URINARY PROBLEMS  *BOWEL PROBLEMS  UNUSUAL RASH Items with * indicate a potential emergency and should be followed up as soon as possible.  Feel free to call the clinic should you have any questions or concerns. The clinic phone number is [phone].  Please show the [place] at check-in to the Emergency Department and triage nurse.  Coronavirus (COVID-19) Are you at risk?  Are you at risk for the Coronavirus (COVID-19)?  To be considered HIGH RISK for Coronavirus (COVID-19), you have to meet the following criteria:  . Traveled to [place], [place], [place], [place] or [place]; or in the [place] to [place], [place], [place], or [place]; and have fever, cough, and shortness of breath within the last 2 weeks of travel OR . Been in close contact with a person diagnosed with COVID-19 within the last 2 weeks and have fever, cough, and shortness of breath . IF YOU DO NOT MEET THESE CRITERIA, YOU ARE CONSIDERED LOW RISK FOR COVID-19.  What to do if you are HIGH RISK for COVID-19?  Marland Kitchen If you are having a medical emergency, call 911. . Seek medical care right away. Before you go to a doctor's office, urgent care or emergency department, call ahead and tell them  about your recent travel, contact with someone diagnosed with COVID-19, and your symptoms. You should receive instructions from your physician's office regarding next steps of care.  . When you arrive at healthcare provider, tell the healthcare staff immediately you have returned from visiting [place], [place], [place], [place] or [place]; or traveled in the [place] to [place], [place], [place], or [place]; in the last two weeks or you have been in close contact with a person diagnosed with COVID-19 in the last 2 weeks.   . Tell the health care staff about your symptoms: fever, cough and shortness of breath. . After you have been seen by a medical provider, you will be either: o Tested for (COVID-19) and discharged home on quarantine except to seek medical care if symptoms worsen, and asked to  - Stay home and avoid contact with others until you get your results (4-5 days)  - Avoid travel on public transportation if possible (such as bus, train, or airplane) or o Sent to the Emergency Department by EMS for evaluation, COVID-19 testing, and possible admission depending on your condition and test results.  What to do if you are LOW RISK for COVID-19?  Reduce your risk of any infection by using the same precautions used for avoiding the common cold or flu:  Marland Kitchen Wash your hands often with soap and warm water for at least 20 seconds.  If soap and water are not readily available, use an alcohol-based hand sanitizer with at least 60% alcohol.  . If coughing or  sneezing, cover your mouth and nose by coughing or sneezing into the elbow areas of your shirt or coat, into a tissue or into your sleeve (not your hands). . Avoid shaking hands with others and consider head nods or verbal greetings only. . Avoid touching your eyes, nose, or mouth with unwashed hands.  . Avoid close contact with people who are sick. . Avoid places or events with large numbers of people in one location, like concerts or  sporting events. . Carefully consider travel plans you have or are making. . If you are planning any travel outside or inside the [place], visit the CDC's Travelers' Health webpage for the latest health notices. . If you have some symptoms but not all symptoms, continue to monitor at home and seek medical attention if your symptoms worsen. . If you are having a medical emergency, call 911.   [place] / e-Visit: [email or web address]         [facility]: [facility]: [phone]                   [facility]: [place] Discharge Instructions for Patients Receiving Chemotherapy  Today you received the following chemotherapy agents Opdivo  To help prevent nausea and vomiting after your treatment, we encourage you to take your nausea medication as directed.    If you develop nausea and vomiting that is not controlled by your nausea medication, call the clinic.   BELOW ARE SYMPTOMS THAT SHOULD BE REPORTED IMMEDIATELY:  *FEVER GREATER THAN 100.5 F  *CHILLS WITH OR WITHOUT FEVER  NAUSEA AND VOMITING THAT IS NOT CONTROLLED WITH YOUR NAUSEA MEDICATION  *UNUSUAL SHORTNESS OF BREATH  *UNUSUAL BRUISING OR BLEEDING  TENDERNESS IN MOUTH AND THROAT WITH OR WITHOUT PRESENCE OF ULCERS  *URINARY PROBLEMS  *BOWEL PROBLEMS  UNUSUAL RASH Items with * indicate a potential emergency and should be followed up as soon as possible.  Feel free to call the clinic should you have any questions or concerns. The clinic phone number is [phone].  Please show the [place] at check-in to the Emergency Department and triage nurse.

## 2019-03-31 ENCOUNTER — Other Ambulatory Visit: Payer: Self-pay

## 2019-03-31 ENCOUNTER — Inpatient Hospital Stay: Payer: BLUE CROSS/BLUE SHIELD

## 2019-03-31 ENCOUNTER — Inpatient Hospital Stay (HOSPITAL_BASED_OUTPATIENT_CLINIC_OR_DEPARTMENT_OTHER): Payer: BLUE CROSS/BLUE SHIELD | Admitting: Medical

## 2019-03-31 ENCOUNTER — Telehealth: Payer: Self-pay | Admitting: *Deleted

## 2019-03-31 VITALS — BP 117/73 | HR 71 | Temp 98.3°F | Resp 19 | Ht 69.0 in | Wt 149.5 lb

## 2019-03-31 DIAGNOSIS — R220 Localized swelling, mass and lump, head: Secondary | ICD-10-CM

## 2019-03-31 DIAGNOSIS — C7802 Secondary malignant neoplasm of left lung: Secondary | ICD-10-CM

## 2019-03-31 DIAGNOSIS — Z79899 Other long term (current) drug therapy: Secondary | ICD-10-CM | POA: Diagnosis not present

## 2019-03-31 DIAGNOSIS — J029 Acute pharyngitis, unspecified: Secondary | ICD-10-CM

## 2019-03-31 DIAGNOSIS — C7931 Secondary malignant neoplasm of brain: Secondary | ICD-10-CM | POA: Diagnosis not present

## 2019-03-31 DIAGNOSIS — K7689 Other specified diseases of liver: Secondary | ICD-10-CM | POA: Diagnosis not present

## 2019-03-31 DIAGNOSIS — B370 Candidal stomatitis: Secondary | ICD-10-CM | POA: Diagnosis not present

## 2019-03-31 DIAGNOSIS — C3412 Malignant neoplasm of upper lobe, left bronchus or lung: Secondary | ICD-10-CM | POA: Diagnosis not present

## 2019-03-31 DIAGNOSIS — G4700 Insomnia, unspecified: Secondary | ICD-10-CM | POA: Diagnosis not present

## 2019-03-31 DIAGNOSIS — C787 Secondary malignant neoplasm of liver and intrahepatic bile duct: Secondary | ICD-10-CM | POA: Diagnosis not present

## 2019-03-31 DIAGNOSIS — L270 Generalized skin eruption due to drugs and medicaments taken internally: Secondary | ICD-10-CM

## 2019-03-31 DIAGNOSIS — Z5112 Encounter for antineoplastic immunotherapy: Secondary | ICD-10-CM | POA: Diagnosis not present

## 2019-03-31 MED ORDER — MAGIC MOUTHWASH W/LIDOCAINE: 5.0000 mL | Freq: Four times a day (QID) | ORAL | 1 refills | Status: DC | PRN

## 2019-03-31 MED ORDER — PREDNISONE 5 MG PO TABS: ORAL_TABLET | ORAL | 0 refills | Status: DC

## 2019-03-31 MED ORDER — AZITHROMYCIN 250 MG PO TABS: ORAL_TABLET | ORAL | 0 refills | Status: DC

## 2019-03-31 NOTE — Progress Notes (Signed)
[facility] Progress Note   [name] [id number] 1976-03-01 43 y.o.  [name] is managed by Dr. Fanny Bien. 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 Julien Nordmann and and is currently treated with nivolumab.  She is scheduled to be seen in follow-up on 04/12/2019.  Sore throat: The patient recently tested negative for streptococcal pharyngitis on a rapid strep screen however no throat culture was submitted.  Throat culture was collected today despite the fact that the patient has been on Augmentin but is only been taking it once daily.  She was given a prescription for Magic mouthwash and was given a prescription for a Z-Pak which she will begin if she is not improving by Sunday.  Facial swelling and drug rash: The patient has had an ongoing drug rash recently which has been believed to be secondary to nivolumab.  She reports that she feels that her throat is swelling now.  Her lower lip is slightly swollen as are her cheeks.  Based on this she was given a prednisone taper.  She was not interested in taking a high-dose prednisone taper.  Because of this she has been started at 30 mg once daily x2 days with a taper down to 5 mg.  She was instructed to call or return if her condition worsens.  Please see After Visit Summary for patient specific instructions.  Future  Appointments  Date Time Provider [place]  04/12/2019 11:30 AM [facility] LAB 6 [facility] None  04/12/2019 12:00 PM Curt Bears, MD [facility] None  04/12/2019  1:00 PM [facility] INFUSION [facility] None  05/03/2019  8:45 AM [facility] ONLY [facility] None  05/03/2019  9:15 AM Curt Bears, MD [facility] None  05/03/2019 10:15 AM [facility] INFUSION [facility] None  06/26/2019  3:30 PM Hayden Pedro, PA-C [facility] None    Orders Placed This Encounter  Procedures  . Culture, Group A Strep  . Aerobic Culture (superficial specimen)       Subjective:   Patient ID:  Leslie [name] is a 43 y.o. (DOB [birth year]) female.  Chief Complaint: No chief complaint on file.   HPI Leslie [name] is a 43 year old female with a history of a metastatic melanoma with pulmonary and brain metastasis who is managed by Dr. Julien Nordmann.  She continues to be treated with nivolumab.  She presents to the office today with a report of an ongoing sore throat.  She continues to have a diffuse nearly whole body rash which is believed secondary to nivolumab.  She is been using Benadryl for pruritus.  She was seen recently in urgent care for her sore throat.  She was placed on Augmentin after a rapid strep was collected and returned negative.  A retroactive throat culture was to be submitted but was not sent to the lab by her report.  She has developed swelling of her lower lip along with mild swelling in her cheeks and a sensation of swelling in her throat.  Because of that she  decreased her Augmentin dose to once daily.  Her sore throat is no better.  She has thick oral secretions.  She denies fevers, chills, sweats, nausea, vomiting, constipation, diarrhea,  Medications: I have reviewed the patient's current medications.  Allergies: No Known Allergies  Past Medical History:  Diagnosis Date  . Migraines   . Yeast infection     Past Surgical History:  Procedure  Laterality Date  . APPLICATION OF CRANIAL NAVIGATION Left 04/29/2018   Procedure: APPLICATION OF CRANIAL NAVIGATION;  Surgeon: Earnie Larsson, MD;  Location: [place];  Service: Neurosurgery;  Laterality: Left;  . CRANIOTOMY Left 04/29/2018   Procedure: LEFT CRANIOTOMY FOR  TUMOR BRAIN LAB;  Surgeon: Earnie Larsson, MD;  Location: [place];  Service: Neurosurgery;  Laterality: Left;    Family History  Problem Relation Age of Onset  . Cancer Father        lung    Social History   Socioeconomic History  . Marital status: Divorced    Spouse name: Not on file  . Number of children: Not on file  . Years of education: Not on file  . Highest education level: Not on file  Occupational History  . Not on file  Social Needs  . Financial resource strain: Not on file  . Food insecurity:    Worry: Not on file    Inability: Not on file  . Transportation needs:    Medical: Not on file    Non-medical: Not on file  Tobacco Use  . Smoking status: Never Smoker  . Smokeless tobacco: Never Used  Substance and Sexual Activity  . Alcohol use: Yes  . Drug use: No  . Sexual activity: Yes    Birth control/protection: Condom  Lifestyle  . Physical activity:    Days per week: Not on file    Minutes per session: Not on file  . Stress: Not on file  Relationships  . Social connections:    Talks on phone: Not on file    Gets together: Not on file    Attends religious service: Not on file    Active member of club or organization: Not on file    Attends meetings of clubs or organizations: Not on file    Relationship status: Not on file  . Intimate partner violence:    Fear of current or ex partner: No    Emotionally abused: No    Physically abused: No    Forced sexual activity: No  Other Topics Concern  . Not on file  Social History Narrative  . Not on file    Past Medical History, Surgical history, Social history, and Family history were reviewed and updated as appropriate.   Please see review of  systems for further details on the patient's review from today.   Review of Systems:  Review of Systems  Constitutional: Negative for chills and diaphoresis.  HENT: Positive for facial swelling and sore throat. Negative for congestion, postnasal drip, sinus pressure, sinus pain and trouble swallowing.   Respiratory: Negative for cough, shortness of breath and wheezing.   Cardiovascular: Negative for chest pain and palpitations.  Gastrointestinal: Negative for constipation, diarrhea, nausea and vomiting.  Skin: Positive for rash.  Neurological: Negative for headaches.    Objective:   Physical Exam:  BP 117/73 (BP Location: Left Arm, Patient Position: Sitting)   Pulse 71   Temp 98.3 F (36.8 C) (Oral)   Resp 19   Ht 5' 9"  (1.753 m)   Wt 149 lb  8 oz (67.8 kg)   SpO2 100%   BMI 22.08 kg/m  ECOG: 1  Physical Exam Constitutional:      General: She is not in acute distress.    Appearance: She is not diaphoretic.  HENT:     Head: Atraumatic.     Comments: The lower lips and cheeks are mildly edematous.    Right Ear: External ear normal.     Left Ear: External ear normal.     Ears:     Comments: The bilateral ear canals are occluded with dark cerumen.    Mouth/Throat:     Mouth: Mucous membranes are moist.     Pharynx: Posterior oropharyngeal erythema present. No oropharyngeal exudate.  Eyes:     General: No scleral icterus.       Right eye: No discharge.        Left eye: No discharge.  Neck:     Musculoskeletal: Normal range of motion and neck supple.  Cardiovascular:     Rate and Rhythm: Normal rate and regular rhythm.     Heart sounds: Normal heart sounds. No murmur. No friction rub. No gallop.   Pulmonary:     Effort: Pulmonary effort is normal. No respiratory distress.     Breath sounds: Normal breath sounds. No wheezing or rales.  Lymphadenopathy:     Cervical: No cervical adenopathy.  Skin:    General: Skin is warm and dry.     Findings: Erythema and rash  present.     Comments: A diffuse slightly raised erythematous rash is noted over the face, neck, chest, and back.  Neurological:     Mental Status: She is alert.     Coordination: Coordination normal.  Psychiatric:        Behavior: Behavior normal.        Thought Content: Thought content normal.        Judgment: Judgment normal.     Lab Review:     Component Value Date/Time   NA 140 03/29/2019 1236   K 4.1 03/29/2019 1236   CL 106 03/29/2019 1236   CO2 23 03/29/2019 1236   GLUCOSE 96 03/29/2019 1236   BUN 14 03/29/2019 1236   CREATININE 0.90 03/29/2019 1236   CALCIUM 9.1 03/29/2019 1236   PROT 7.1 03/29/2019 1236   ALBUMIN 4.0 03/29/2019 1236   AST 66 (H) 03/29/2019 1236   ALT 85 (H) 03/29/2019 1236   ALKPHOS 103 03/29/2019 1236   BILITOT 0.5 03/29/2019 1236   GFRNONAA >60 03/29/2019 1236   GFRAA >60 03/29/2019 1236       Component Value Date/Time   WBC 4.3 03/29/2019 1236   WBC 23.1 (H) 04/30/2018 0350   RBC 4.52 03/29/2019 1236   HGB 14.1 03/29/2019 1236   HCT 41.7 03/29/2019 1236   PLT 268 03/29/2019 1236   MCV 92.3 03/29/2019 1236   MCH 31.2 03/29/2019 1236   MCHC 33.8 03/29/2019 1236   RDW 12.9 03/29/2019 1236   LYMPHSABS 0.5 (L) 03/29/2019 1236   MONOABS 0.8 03/29/2019 1236   EOSABS 0.0 03/29/2019 1236   BASOSABS 0.0 03/29/2019 1236   -------------------------------  Imaging from last 24 hours (if applicable):  Radiology interpretation: Mr Jeri Cos NL Contrast  Result Date: 03/17/2019 CLINICAL DATA:  S RS evaluation. Multiple metastatic lesions. Malignant melanoma. Postop resection of left temporal mass. EXAM: MRI HEAD WITHOUT AND WITH CONTRAST TECHNIQUE: Multiplanar, multiecho pulse sequences of the brain and surrounding structures were obtained without and with intravenous contrast. CONTRAST:  36m MULTIHANCE GADOBENATE DIMEGLUMINE 529 MG/ML IV SOLN COMPARISON:  12/16/2018 FINDINGS: Brain: No evidence of residual or recurrent disease. Previous left  temporal craniotomy for tumor resection in the left temporal lobe. Volume loss and gliosis in that region is stable, with linear enhancement along the inferior operative site. No sign of residual or recurrent disease in that location. Elsewhere, the brain shows a few scattered punctate foci of T2 and FLAIR signal within the hemispheric white matter which are stable. No sign of second metastatic lesion. No hemorrhage, hydrocephalus or extra-axial collection. Vascular: Major vessels at the base of the brain show flow. Skull and upper cervical spine: Otherwise negative Sinuses/Orbits: Clear/normal Other: None IMPRESSION: Stable examination. Postoperative changes of left temporal lobe. No evidence of residual or recurrent disease in that location. No second brain lesion identified. Minimal punctate T2 white matter foci are unchanged. Electronically Signed   By: MNelson ChimesM.D.   On: 03/17/2019 15:24

## 2019-03-31 NOTE — Telephone Encounter (Signed)
Received TC from patient. She states she thinks she is having an allergic reaction to her recent infusion of Nivolumab (03/29/19).  She states she is having trouble swallowing, sore throat, swelling in her lips, possible rash though she was unable to be specific about this. She is being treated for pharyngitis (Augmentin) prior to her recent immunotherapy. Denies fever/chills/cough. Discussed with Sandi Mealy, PA  He will see her in [facility] today.  Pt aware to be here at 11am.

## 2019-04-03 LAB — AEROBIC CULTURE W GRAM STAIN (SUPERFICIAL SPECIMEN): Culture: NORMAL

## 2019-04-03 NOTE — Progress Notes (Signed)
These preliminary result these preliminary results were noted.  Awaiting final report.

## 2019-04-05 ENCOUNTER — Other Ambulatory Visit: Payer: BLUE CROSS/BLUE SHIELD

## 2019-04-05 ENCOUNTER — Ambulatory Visit: Payer: BLUE CROSS/BLUE SHIELD

## 2019-04-06 NOTE — Progress Notes (Signed)
These results were called to Barnett Hatter and were reviewed with her . Her questions were answered. She expressed understanding.

## 2019-04-10 ENCOUNTER — Telehealth: Payer: Self-pay | Admitting: Internal Medicine

## 2019-04-10 ENCOUNTER — Telehealth: Payer: Self-pay | Admitting: Medical Oncology

## 2019-04-10 NOTE — Telephone Encounter (Addendum)
"  throat swelling sensation with increased mucous started back last night so she stayed on prednisone 15 mg/day instead of reducing it as prescribed. Today and tomorrow  would have been 5 mg/day .

## 2019-04-10 NOTE — Telephone Encounter (Signed)
Per Julien Nordmann , I instructed her to see PCP . She does not have a PCP and most recent visit was with Sandi Mealy re her sore throat and increased mucous. I told pt to keep appt for tomorrow. She is convinced the symptoms are  from Nivolumab.

## 2019-04-10 NOTE — Telephone Encounter (Signed)
Called patient to confirm changes made in schedule. Moved 4/29 lab and MD to 4/28 per sch msg. Confirmed with patient.

## 2019-04-11 ENCOUNTER — Inpatient Hospital Stay: Payer: BLUE CROSS/BLUE SHIELD

## 2019-04-11 ENCOUNTER — Encounter: Payer: Self-pay | Admitting: Internal Medicine

## 2019-04-11 ENCOUNTER — Inpatient Hospital Stay (HOSPITAL_BASED_OUTPATIENT_CLINIC_OR_DEPARTMENT_OTHER): Payer: BLUE CROSS/BLUE SHIELD | Admitting: Internal Medicine

## 2019-04-11 ENCOUNTER — Other Ambulatory Visit: Payer: Self-pay

## 2019-04-11 VITALS — BP 135/79 | HR 79 | Temp 98.8°F | Resp 18 | Ht 69.0 in | Wt 148.9 lb

## 2019-04-11 DIAGNOSIS — C7802 Secondary malignant neoplasm of left lung: Secondary | ICD-10-CM

## 2019-04-11 DIAGNOSIS — J029 Acute pharyngitis, unspecified: Secondary | ICD-10-CM | POA: Diagnosis not present

## 2019-04-11 DIAGNOSIS — R5382 Chronic fatigue, unspecified: Secondary | ICD-10-CM

## 2019-04-11 DIAGNOSIS — C3432 Malignant neoplasm of lower lobe, left bronchus or lung: Secondary | ICD-10-CM

## 2019-04-11 DIAGNOSIS — G4700 Insomnia, unspecified: Secondary | ICD-10-CM

## 2019-04-11 DIAGNOSIS — C7931 Secondary malignant neoplasm of brain: Secondary | ICD-10-CM

## 2019-04-11 DIAGNOSIS — C787 Secondary malignant neoplasm of liver and intrahepatic bile duct: Secondary | ICD-10-CM

## 2019-04-11 DIAGNOSIS — Z5112 Encounter for antineoplastic immunotherapy: Secondary | ICD-10-CM | POA: Diagnosis not present

## 2019-04-11 DIAGNOSIS — K7689 Other specified diseases of liver: Secondary | ICD-10-CM

## 2019-04-11 DIAGNOSIS — C3412 Malignant neoplasm of upper lobe, left bronchus or lung: Secondary | ICD-10-CM | POA: Diagnosis not present

## 2019-04-11 DIAGNOSIS — Z79899 Other long term (current) drug therapy: Secondary | ICD-10-CM

## 2019-04-11 DIAGNOSIS — B370 Candidal stomatitis: Secondary | ICD-10-CM

## 2019-04-11 LAB — CBC WITH DIFFERENTIAL (CANCER CENTER ONLY)
Abs Immature Granulocytes: 0.01 10*3/uL (ref 0.00–0.07)
Basophils Absolute: 0 10*3/uL (ref 0.0–0.1)
Basophils Relative: 0 %
Eosinophils Absolute: 0 10*3/uL (ref 0.0–0.5)
Eosinophils Relative: 0 %
HCT: 43.5 % (ref 36.0–46.0)
Hemoglobin: 14.6 g/dL (ref 12.0–15.0)
Immature Granulocytes: 0 %
Lymphocytes Relative: 4 %
Lymphs Abs: 0.3 10*3/uL — ABNORMAL LOW (ref 0.7–4.0)
MCH: 30.9 pg (ref 26.0–34.0)
MCHC: 33.6 g/dL (ref 30.0–36.0)
MCV: 92.2 fL (ref 80.0–100.0)
Monocytes Absolute: 0.3 10*3/uL (ref 0.1–1.0)
Monocytes Relative: 5 %
Neutro Abs: 6.5 10*3/uL (ref 1.7–7.7)
Neutrophils Relative %: 91 %
Platelet Count: 273 10*3/uL (ref 150–400)
RBC: 4.72 MIL/uL (ref 3.87–5.11)
RDW: 12.9 % (ref 11.5–15.5)
WBC Count: 7.2 10*3/uL (ref 4.0–10.5)
nRBC: 0 % (ref 0.0–0.2)

## 2019-04-11 LAB — CMP (CANCER CENTER ONLY)
ALT: 50 U/L — ABNORMAL HIGH (ref 0–44)
AST: 38 U/L (ref 15–41)
Albumin: 3.9 g/dL (ref 3.5–5.0)
Alkaline Phosphatase: 85 U/L (ref 38–126)
Anion gap: 9 (ref 5–15)
BUN: 14 mg/dL (ref 6–20)
CO2: 24 mmol/L (ref 22–32)
Calcium: 8.4 mg/dL — ABNORMAL LOW (ref 8.9–10.3)
Chloride: 106 mmol/L (ref 98–111)
Creatinine: 0.92 mg/dL (ref 0.44–1.00)
GFR, Est AFR Am: >60 mL/min (ref >60)
GFR, Est Non Af Am: >60 mL/min (ref >60)
Glucose, Bld: 103 mg/dL — ABNORMAL HIGH (ref 70–99)
Potassium: 4.1 mmol/L (ref 3.5–5.1)
Sodium: 139 mmol/L (ref 135–145)
Total Bilirubin: 0.5 mg/dL (ref 0.3–1.2)
Total Protein: 7 g/dL (ref 6.5–8.1)

## 2019-04-11 LAB — TSH: TSH: 1.437 u[IU]/mL (ref 0.308–3.960)

## 2019-04-11 MED ORDER — SODIUM CHLORIDE 0.9 % IV SOLN
240.0000 mg | Freq: Once | INTRAVENOUS | Status: AC
  Administered 2019-04-11: 240 mg via INTRAVENOUS
  Filled 2019-04-11: qty 24

## 2019-04-11 MED ORDER — FLUCONAZOLE 100 MG PO TABS: 100.0000 mg | ORAL_TABLET | Freq: Every day | ORAL | 0 refills | Status: DC

## 2019-04-11 MED ORDER — SODIUM CHLORIDE 0.9 % IV SOLN
Freq: Once | INTRAVENOUS | Status: AC
  Administered 2019-04-11: 13:00:00 via INTRAVENOUS
  Filled 2019-04-11: qty 250

## 2019-04-11 NOTE — Progress Notes (Signed)
[place] [phone]   [phone]  OFFICE PROGRESS NOTE  System, Pcp Not In No address on file  DIAGNOSIS: Metastatic high-grade neoplasm consistent with metastatic malignant melanoma based on the most recent pathology report from [facility] in October 2019, presented with large left left upper/left lower lobe mass with a hypermetabolic AP window lymph node as well as solitary metastatic brain lesion diagnosed in May 2019.  Biomarker Findings Microsatellite status - MS-Stable Tumor Mutational Burden - TMB-Intermediate (16 Muts/Mb) Genomic Findings For a complete list of the genes assayed, please refer to the Appendix. CD274 (PD-L1) amplification NRAS Q61R PDCD1LG2 (PD-L2) amplification MYC amplification EPHB1 amplification JAK2 amplification RB1 Q93*, E938* TERT promoter -146C>T TP53 C275W   PRIOR THERAPY: 1) left temporal craniotomy with resection of tumor with intraoperative stereotactic guidance for volumetric resection under the care of Dr. Annette Stable on 04/29/2018. 2) First-line treatment with immunotherapy with Keytruda 200 mg IV every 3 weeks.  First dose June 02, 2018.  Status post 3 cycles.  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 3) palliative radiotherapy to the left lung mass under the care of Dr. Lisbeth Renshaw completed on 08/24/2018. 4) Resuming her treatment again with Keytruda 200 mg IV every 3 weeks, first dose 09/08/2018.  Status post 2 cycles.  CURRENT THERAPY: Treatment with immunotherapy with ipilimumab 3 mg/KG and nivolumab 1 mg/KG every 3 weeks for the first 4 cycles followed by maintenance nivolumab 240 mg IV every 2 weeks.  First dose of this treatment October 26, 2018.  Status post 6 cycles.  Ipilimumab was discontinued after cycle #2 for the significant liver dysfunction.  Starting from cycle #3 the patient is on treatment with single agent nivolumab.  INTERVAL  HISTORY: Leslie [name] 43 y.o. female returns to the clinic today for follow-up visit.  The patient is feeling fine today with no concerning complaints except for sore throat and backslash.  She was started recently on a week of prednisone and felt a little bit better in her throat but she developed the oral thrush.  She denied having any current chest pain, shortness of breath, cough or hemoptysis.  She denied having any fever or chills.  She has no nausea, vomiting, diarrhea or constipation.  She has no headache or visual changes.  She is here today for evaluation before starting cycle #7 of her treatment.   MEDICAL HISTORY: Past Medical History:  Diagnosis Date   Migraines    Yeast infection     ALLERGIES:  has No Known Allergies.  MEDICATIONS:  Current Outpatient Medications  Medication Sig Dispense Refill   Ascorbic Acid (LIQUID C 500) 500 MG/15ML LIQD Take 1,000 mg by mouth daily.     azithromycin (ZITHROMAX Z-PAK) 250 MG tablet 2 tablets day 1 and then 1 tablet days 2 through 5 6 each 0   Cholecalciferol 1000 UNIT/10ML LIQD Take by mouth.     Doxylamine Succinate, Sleep, (UNISOM PO) Take by mouth.     GAMMA AMINOBUTYRIC ACID PO Take by mouth.     HCA CALCIUM-MAGNESIUM-ZINC PO Take by mouth. Taking one half tablespoon daily     magic mouthwash w/lidocaine SOLN Take 5 mLs by mouth 4 (four) times daily as needed for mouth pain. 240 mL 1   Melatonin 5 MG CAPS Take by mouth.     Multiple Vitamin (MULTIVITAMIN) tablet Take 1 tablet by mouth daily.     Omega 3-6-9 Fatty Acids (OMEGA 3-6-9 COMPLEX PO) Take 1  capsule by mouth daily.     OVER THE COUNTER MEDICATION      predniSONE (DELTASONE) 10 MG tablet Take 1 tablet by mouth as directed.     predniSONE (DELTASONE) 5 MG tablet 6 tab x 2 day, 5 tab x 2 day, 4 tab x 2 day, 3 tab x 2 day, 2 tab x 2 day, 1 tab x 2 day, stop 42 tablet 0   prochlorperazine (COMPAZINE) 10 MG tablet Take 1 tablet (10 mg total) by mouth  every 6 (six) hours as needed for nausea or vomiting. 30 tablet 0   temazepam (RESTORIL) 15 MG capsule Take 1 capsule (15 mg total) by mouth at bedtime as needed for sleep. 20 capsule 0   No current facility-administered medications for this visit.     SURGICAL HISTORY:  Past Surgical History:  Procedure Laterality Date   APPLICATION OF CRANIAL NAVIGATION Left 04/29/2018   Procedure: APPLICATION OF CRANIAL NAVIGATION;  Surgeon: Earnie Larsson, MD;  Location: [place];  Service: Neurosurgery;  Laterality: Left;   CRANIOTOMY Left 04/29/2018   Procedure: LEFT CRANIOTOMY FOR  TUMOR BRAIN LAB;  Surgeon: Earnie Larsson, MD;  Location: [place];  Service: Neurosurgery;  Laterality: Left;    REVIEW OF SYSTEMS:  A comprehensive review of systems was negative except for: Ears, nose, mouth, throat, and face: positive for sore throat and Oral thrush   PHYSICAL EXAMINATION: General appearance: alert, cooperative and no distress Head: Normocephalic, without obvious abnormality, atraumatic Neck: no adenopathy, no JVD, supple, symmetrical, trachea midline and thyroid not enlarged, symmetric, no tenderness/mass/nodules Lymph nodes: Cervical, supraclavicular, and axillary nodes normal. Resp: clear to auscultation bilaterally Back: negative, symmetric, no curvature. ROM normal. No CVA tenderness. Cardio: regular rate and rhythm, S1, S2 normal, no murmur, click, rub or gallop GI: soft, non-tender; bowel sounds normal; no masses,  no organomegaly Extremities: extremities normal, atraumatic, no cyanosis or edema  ECOG PERFORMANCE STATUS: 1 - Symptomatic but completely ambulatory  Blood pressure 135/79, pulse 79, temperature 98.8 F (37.1 C), temperature source Oral, resp. rate 18, height 5' 9"  (1.753 m), weight 148 lb 14.4 oz (67.5 kg), SpO2 97 %, unknown if currently breastfeeding.  LABORATORY DATA: Lab Results  Component Value Date   WBC 7.2 04/11/2019   HGB 14.6 04/11/2019   HCT 43.5 04/11/2019   MCV 92.2  04/11/2019   PLT 273 04/11/2019      Chemistry      Component Value Date/Time   NA 140 03/29/2019 1236   K 4.1 03/29/2019 1236   CL 106 03/29/2019 1236   CO2 23 03/29/2019 1236   BUN 14 03/29/2019 1236   CREATININE 0.90 03/29/2019 1236      Component Value Date/Time   CALCIUM 9.1 03/29/2019 1236   ALKPHOS 103 03/29/2019 1236   AST 66 (H) 03/29/2019 1236   ALT 85 (H) 03/29/2019 1236   BILITOT 0.5 03/29/2019 1236       RADIOGRAPHIC STUDIES: Mr Jeri Cos SE Contrast  Result Date: 03/17/2019 CLINICAL DATA:  S RS evaluation. Multiple metastatic lesions. Malignant melanoma. Postop resection of left temporal mass. EXAM: MRI HEAD WITHOUT AND WITH CONTRAST TECHNIQUE: Multiplanar, multiecho pulse sequences of the brain and surrounding structures were obtained without and with intravenous contrast. CONTRAST:  54m MULTIHANCE GADOBENATE DIMEGLUMINE 529 MG/ML IV SOLN COMPARISON:  12/16/2018 FINDINGS: Brain: No evidence of residual or recurrent disease. Previous left temporal craniotomy for tumor resection in the left temporal lobe. Volume loss and gliosis in that region is stable, with  linear enhancement along the inferior operative site. No sign of residual or recurrent disease in that location. Elsewhere, the brain shows a few scattered punctate foci of T2 and FLAIR signal within the hemispheric white matter which are stable. No sign of second metastatic lesion. No hemorrhage, hydrocephalus or extra-axial collection. Vascular: Major vessels at the base of the brain show flow. Skull and upper cervical spine: Otherwise negative Sinuses/Orbits: Clear/normal Other: None IMPRESSION: Stable examination. Postoperative changes of left temporal lobe. No evidence of residual or recurrent disease in that location. No second brain lesion identified. Minimal punctate T2 white matter foci are unchanged. Electronically Signed   By: Nelson Chimes M.D.   On: 03/17/2019 15:24    ASSESSMENT AND PLAN: This is a very  pleasant 43 years old white female with highly suspicious metastatic malignant melanoma presented with large mass in the left upper/left lower lobe and mediastinal lymphadenopathy as well as solitary brain metastasis status post left temporal craniotomy and resection of tumor on 04/29/2018 and she is recovering well from her surgery. The patient completed stereotactic radiotherapy to the resection cavity next week under the care of Dr. Lisbeth Renshaw. The patient underwent treatment with immunotherapy with Keytruda 200 mg IV every 3 weeks status post 3 cycles.   Her scan after cycle #3 showed enlargement of the left upper lobe lung mass.  This was also suspicious for pseudo-progression on immunotherapy.  The patient was started on a palliative course of radiotherapy to the left upper lobe lung mass and she tolerated this treatment fairly well.  She was seen by Dr. Emelda Brothers at [facility] and she recommended for the patient to resume her treatment with [facility] for now until she undergoes further molecular studies.  The patient was treated with 2 more cycles of Keytruda and tolerated the treatment well. Her recent CT scan of the chest, abdomen and pelvis showed some improvement in the left upper lobe lung mass but there is still concern about pericardial invasion. I personally and independently reviewed the scan images and discussed the result and showed the images to the patient and her boyfriend today.  She is still not a good candidate for surgical resection because of the pericardial invasion. The final pathology report and recommendation from [facility] was consistent with metastatic melanoma and the recommendation is to switch the patient to a combination immunotherapy with Ipilumumab and nivolumab. The patient is currently undergoing treatment with immunotherapy with ipilimumab and nivolumab status post 2 cycles.  Her treatment was on hold for more than 6 weeks secondary to grade 3 hepatic  dysfunction.  She had improvement of her liver enzyme after a prolonged treatment with a steroid with a tapering schedule. She resumed her treatment again with single agent nivolumab.  She is status post 6 cycles of treatment. The patient is tolerating this treatment well with no concerning complaints. I recommended for her to proceed with cycle #7 today as scheduled. I will see the patient back for follow-up visit in 2 weeks for evaluation after repeating CT scan of the chest, abdomen and pelvis for restaging of her disease.   For the oral thrush, I will start the patient on Diflucan 100 mg p.o. daily. For the sore throat, she will continue with prednisone 10 mg p.o. daily for 1 week. The patient was advised to call immediately if she has any concerning symptoms in the interval. The patient voices understanding of current disease status and treatment options and is in agreement with the current care plan.  All questions were answered. The patient knows to call the clinic with any problems, questions or concerns. We can certainly see the patient much sooner if necessary.  Disclaimer: This note was dictated with voice recognition software. Similar sounding words can inadvertently be transcribed and may not be corrected upon review.

## 2019-04-11 NOTE — Patient Instructions (Signed)
[facility] Discharge Instructions for Patients Receiving Chemotherapy  Today you received the following chemotherapy agents Nivolumab (OPDIVO).  To help prevent nausea and vomiting after your treatment, we encourage you to take your nausea medication as prescribed.   If you develop nausea and vomiting that is not controlled by your nausea medication, call the clinic.   BELOW ARE SYMPTOMS THAT SHOULD BE REPORTED IMMEDIATELY:  *FEVER GREATER THAN 100.5 F  *CHILLS WITH OR WITHOUT FEVER  NAUSEA AND VOMITING THAT IS NOT CONTROLLED WITH YOUR NAUSEA MEDICATION  *UNUSUAL SHORTNESS OF BREATH  *UNUSUAL BRUISING OR BLEEDING  TENDERNESS IN MOUTH AND THROAT WITH OR WITHOUT PRESENCE OF ULCERS  *URINARY PROBLEMS  *BOWEL PROBLEMS  UNUSUAL RASH Items with * indicate a potential emergency and should be followed up as soon as possible.  Feel free to call the clinic should you have any questions or concerns. The clinic phone number is [phone].  Please show the [place] at check-in to the Emergency Department and triage nurse.  Coronavirus (COVID-19) Are you at risk?  Are you at risk for the Coronavirus (COVID-19)?  To be considered HIGH RISK for Coronavirus (COVID-19), you have to meet the following criteria:  . Traveled to [place], [place], [place], [place] or [place]; or in the [place] to [place], [employer], [place], or [place]; and have fever, cough, and shortness of breath within the last 2 weeks of travel OR . Been in close contact with a person diagnosed with COVID-19 within the last 2 weeks and have fever, cough, and shortness of breath . IF YOU DO NOT MEET THESE CRITERIA, YOU ARE CONSIDERED LOW RISK FOR COVID-19.  What to do if you are HIGH RISK for COVID-19?  Marland Kitchen If you are having a medical emergency, call 911. . Seek medical care right away. Before you go to a doctor's office, urgent care or emergency department, call ahead and tell them  about your recent travel, contact with someone diagnosed with COVID-19, and your symptoms. You should receive instructions from your physician's office regarding next steps of care.  . When you arrive at healthcare provider, tell the healthcare staff immediately you have returned from visiting [place], [place], [place], [place] or [place]; or traveled in the [place] to [place], [place], [place], or [place]; in the last two weeks or you have been in close contact with a person diagnosed with COVID-19 in the last 2 weeks.   . Tell the health care staff about your symptoms: fever, cough and shortness of breath. . After you have been seen by a medical provider, you will be either: o Tested for (COVID-19) and discharged home on quarantine except to seek medical care if symptoms worsen, and asked to  - Stay home and avoid contact with others until you get your results (4-5 days)  - Avoid travel on public transportation if possible (such as bus, train, or airplane) or o Sent to the Emergency Department by EMS for evaluation, COVID-19 testing, and possible admission depending on your condition and test results.  What to do if you are LOW RISK for COVID-19?  Reduce your risk of any infection by using the same precautions used for avoiding the common cold or flu:  Marland Kitchen Wash your hands often with soap and warm water for at least 20 seconds.  If soap and water are not readily available, use an alcohol-based hand sanitizer with at least 60% alcohol.  . If coughing or  sneezing, cover your mouth and nose by coughing or sneezing into the elbow areas of your shirt or coat, into a tissue or into your sleeve (not your hands). . Avoid shaking hands with others and consider head nods or verbal greetings only. . Avoid touching your eyes, nose, or mouth with unwashed hands.  . Avoid close contact with people who are sick. . Avoid places or events with large numbers of people in one location, like concerts or  sporting events. . Carefully consider travel plans you have or are making. . If you are planning any travel outside or inside the [place], visit the CDC's Travelers' Health webpage for the latest health notices. . If you have some symptoms but not all symptoms, continue to monitor at home and seek medical attention if your symptoms worsen. . If you are having a medical emergency, call 911.   [place] / e-Visit: [email or web address]         [facility]: [facility]: [phone]                   [facility]: [facility] Discharge Instructions for Patients Receiving Chemotherapy  Today you received the following chemotherapy agents Opdivo  To help prevent nausea and vomiting after your treatment, we encourage you to take your nausea medication as directed.    If you develop nausea and vomiting that is not controlled by your nausea medication, call the clinic.   BELOW ARE SYMPTOMS THAT SHOULD BE REPORTED IMMEDIATELY:  *FEVER GREATER THAN 100.5 F  *CHILLS WITH OR WITHOUT FEVER  NAUSEA AND VOMITING THAT IS NOT CONTROLLED WITH YOUR NAUSEA MEDICATION  *UNUSUAL SHORTNESS OF BREATH  *UNUSUAL BRUISING OR BLEEDING  TENDERNESS IN MOUTH AND THROAT WITH OR WITHOUT PRESENCE OF ULCERS  *URINARY PROBLEMS  *BOWEL PROBLEMS  UNUSUAL RASH Items with * indicate a potential emergency and should be followed up as soon as possible.  Feel free to call the clinic should you have any questions or concerns. The clinic phone number is [phone].  Please show the [facility] at check-in to the Emergency Department and triage nurse.

## 2019-04-12 ENCOUNTER — Inpatient Hospital Stay: Payer: BLUE CROSS/BLUE SHIELD

## 2019-04-12 ENCOUNTER — Ambulatory Visit: Payer: BLUE CROSS/BLUE SHIELD

## 2019-04-12 ENCOUNTER — Inpatient Hospital Stay: Payer: BLUE CROSS/BLUE SHIELD | Admitting: Internal Medicine

## 2019-04-12 ENCOUNTER — Other Ambulatory Visit: Payer: BLUE CROSS/BLUE SHIELD

## 2019-04-13 ENCOUNTER — Telehealth: Payer: Self-pay | Admitting: Internal Medicine

## 2019-04-13 NOTE — Telephone Encounter (Signed)
Cancelled 5/20 appts per sch msg .

## 2019-04-20 ENCOUNTER — Other Ambulatory Visit: Payer: Self-pay

## 2019-04-20 ENCOUNTER — Ambulatory Visit (HOSPITAL_COMMUNITY)
Admission: RE | Admit: 2019-04-20 | Discharge: 2019-04-20 | Disposition: A | Discharge status: Home / Self Care | Payer: BLUE CROSS/BLUE SHIELD | Source: Ambulatory Visit | Attending: Internal Medicine | Admitting: Internal Medicine

## 2019-04-20 DIAGNOSIS — C7802 Secondary malignant neoplasm of left lung: Secondary | ICD-10-CM | POA: Diagnosis not present

## 2019-04-20 DIAGNOSIS — C439 Malignant melanoma of skin, unspecified: Secondary | ICD-10-CM | POA: Diagnosis not present

## 2019-04-20 MED ORDER — IOHEXOL 300 MG/ML  SOLN
100.0000 mL | Freq: Once | INTRAMUSCULAR | Status: AC | PRN
  Administered 2019-04-20: 09:00:00 100 mL via INTRAVENOUS

## 2019-04-20 MED ORDER — SODIUM CHLORIDE (PF) 0.9 % IJ SOLN
INTRAMUSCULAR | Status: AC
  Filled 2019-04-20: qty 50

## 2019-04-24 ENCOUNTER — Inpatient Hospital Stay: Payer: BLUE CROSS/BLUE SHIELD | Attending: Internal Medicine

## 2019-04-24 ENCOUNTER — Inpatient Hospital Stay: Payer: BLUE CROSS/BLUE SHIELD

## 2019-04-24 ENCOUNTER — Encounter: Payer: Self-pay | Admitting: Internal Medicine

## 2019-04-24 ENCOUNTER — Other Ambulatory Visit: Payer: Self-pay

## 2019-04-24 ENCOUNTER — Inpatient Hospital Stay (HOSPITAL_BASED_OUTPATIENT_CLINIC_OR_DEPARTMENT_OTHER): Payer: BLUE CROSS/BLUE SHIELD | Admitting: Internal Medicine

## 2019-04-24 VITALS — BP 121/82 | HR 100 | Temp 97.1°F | Resp 18 | Ht 69.0 in | Wt 150.4 lb

## 2019-04-24 DIAGNOSIS — K7689 Other specified diseases of liver: Secondary | ICD-10-CM | POA: Insufficient documentation

## 2019-04-24 DIAGNOSIS — Z923 Personal history of irradiation: Secondary | ICD-10-CM

## 2019-04-24 DIAGNOSIS — Z5112 Encounter for antineoplastic immunotherapy: Secondary | ICD-10-CM | POA: Diagnosis not present

## 2019-04-24 DIAGNOSIS — B370 Candidal stomatitis: Secondary | ICD-10-CM

## 2019-04-24 DIAGNOSIS — C7931 Secondary malignant neoplasm of brain: Secondary | ICD-10-CM | POA: Diagnosis not present

## 2019-04-24 DIAGNOSIS — R5382 Chronic fatigue, unspecified: Secondary | ICD-10-CM

## 2019-04-24 DIAGNOSIS — C7802 Secondary malignant neoplasm of left lung: Secondary | ICD-10-CM | POA: Insufficient documentation

## 2019-04-24 DIAGNOSIS — C3432 Malignant neoplasm of lower lobe, left bronchus or lung: Secondary | ICD-10-CM

## 2019-04-24 DIAGNOSIS — Z79899 Other long term (current) drug therapy: Secondary | ICD-10-CM | POA: Diagnosis not present

## 2019-04-24 LAB — CBC WITH DIFFERENTIAL (CANCER CENTER ONLY)
Abs Immature Granulocytes: 0 10*3/uL (ref 0.00–0.07)
Basophils Absolute: 0 10*3/uL (ref 0.0–0.1)
Basophils Relative: 1 %
Eosinophils Absolute: 0.1 10*3/uL (ref 0.0–0.5)
Eosinophils Relative: 3 %
HCT: 41.3 % (ref 36.0–46.0)
Hemoglobin: 14.1 g/dL (ref 12.0–15.0)
Immature Granulocytes: 0 %
Lymphocytes Relative: 16 %
Lymphs Abs: 0.6 10*3/uL — ABNORMAL LOW (ref 0.7–4.0)
MCH: 31.4 pg (ref 26.0–34.0)
MCHC: 34.1 g/dL (ref 30.0–36.0)
MCV: 92 fL (ref 80.0–100.0)
Monocytes Absolute: 0.5 10*3/uL (ref 0.1–1.0)
Monocytes Relative: 15 %
Neutro Abs: 2.2 10*3/uL (ref 1.7–7.7)
Neutrophils Relative %: 65 %
Platelet Count: 248 10*3/uL (ref 150–400)
RBC: 4.49 MIL/uL (ref 3.87–5.11)
RDW: 13 % (ref 11.5–15.5)
WBC Count: 3.4 10*3/uL — ABNORMAL LOW (ref 4.0–10.5)
nRBC: 0 % (ref 0.0–0.2)

## 2019-04-24 LAB — CMP (CANCER CENTER ONLY)
ALT: 62 U/L — ABNORMAL HIGH (ref 0–44)
AST: 53 U/L — ABNORMAL HIGH (ref 15–41)
Albumin: 3.6 g/dL (ref 3.5–5.0)
Alkaline Phosphatase: 84 U/L (ref 38–126)
Anion gap: 8 (ref 5–15)
BUN: 13 mg/dL (ref 6–20)
CO2: 24 mmol/L (ref 22–32)
Calcium: 8.7 mg/dL — ABNORMAL LOW (ref 8.9–10.3)
Chloride: 107 mmol/L (ref 98–111)
Creatinine: 0.89 mg/dL (ref 0.44–1.00)
GFR, Est AFR Am: >60 mL/min (ref >60)
GFR, Estimated: >60 mL/min (ref >60)
Glucose, Bld: 71 mg/dL (ref 70–99)
Potassium: 4.1 mmol/L (ref 3.5–5.1)
Sodium: 139 mmol/L (ref 135–145)
Total Bilirubin: 0.3 mg/dL (ref 0.3–1.2)
Total Protein: 6.4 g/dL — ABNORMAL LOW (ref 6.5–8.1)

## 2019-04-24 LAB — TSH: TSH: 2.498 u[IU]/mL (ref 0.308–3.960)

## 2019-04-24 MED ORDER — SODIUM CHLORIDE 0.9 % IV SOLN
Freq: Once | INTRAVENOUS | Status: AC
  Administered 2019-04-24: 11:00:00 via INTRAVENOUS
  Filled 2019-04-24: qty 250

## 2019-04-24 MED ORDER — SODIUM CHLORIDE 0.9 % IV SOLN
240.0000 mg | Freq: Once | INTRAVENOUS | Status: AC
  Administered 2019-04-24: 240 mg via INTRAVENOUS
  Filled 2019-04-24: qty 24

## 2019-04-24 NOTE — Progress Notes (Signed)
Leslie [name] [phone]   [phone]  OFFICE PROGRESS NOTE  System, Pcp Not In No address on file  DIAGNOSIS: Metastatic high-grade neoplasm consistent with metastatic malignant melanoma based on the most recent pathology report from [facility] in October 2019, presented with large left left upper/left lower lobe mass with a hypermetabolic AP window lymph node as well as solitary metastatic brain lesion diagnosed in May 2019.  Biomarker Findings Microsatellite status - MS-Stable Tumor Mutational Burden - TMB-Intermediate (16 Muts/Mb) Genomic Findings For a complete list of the genes assayed, please refer to the Appendix. CD274 (PD-L1) amplification NRAS Q61R PDCD1LG2 (PD-L2) amplification MYC amplification EPHB1 amplification JAK2 amplification RB1 Q93*, P950* TERT promoter -146C>T TP53 C275W   PRIOR THERAPY: 1) left temporal craniotomy with resection of tumor with intraoperative stereotactic guidance for volumetric resection under the care of Dr. Annette Stable on 04/29/2018. 2) First-line treatment with immunotherapy with Keytruda 200 mg IV every 3 weeks.  First dose June 02, 2018.  Status post 3 cycles.  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 3) palliative radiotherapy to the left lung mass under the care of Dr. Lisbeth Renshaw completed on 08/24/2018. 4) Resuming her treatment again with Keytruda 200 mg IV every 3 weeks, first dose 09/08/2018.  Status post 2 cycles.  CURRENT THERAPY: Treatment with immunotherapy with ipilimumab 3 mg/KG and nivolumab 1 mg/KG every 3 weeks for the first 4 cycles followed by maintenance nivolumab 240 mg IV every 2 weeks.  First dose of this treatment October 26, 2018.  Status post 7 cycles.  Ipilimumab was discontinued after cycle #2 for the significant liver dysfunction.  Starting from cycle #3 the patient is on treatment with single agent nivolumab.  INTERVAL  HISTORY: Leslie [name] 43 y.o. female returns to the clinic today for follow-up visit.  The patient is feeling fine today with no concerning complaints.  She denied having any chest pain, shortness of breath, cough or hemoptysis.  She denied having any fever or chills.  She has no nausea, vomiting, diarrhea or constipation.  She denied having any significant weight loss or night sweats.  She continues to tolerate her therapy.  Well.  The patient had repeat CT scan of the chest, abdomen pelvis performed recently and she is here for evaluation and discussion of her scan results.   MEDICAL HISTORY: Past Medical History:  Diagnosis Date   Migraines    Yeast infection     ALLERGIES:  has No Known Allergies.  MEDICATIONS:  Current Outpatient Medications  Medication Sig Dispense Refill   Ascorbic Acid (LIQUID C 500) 500 MG/15ML LIQD Take 1,000 mg by mouth daily.     azithromycin (ZITHROMAX Z-PAK) 250 MG tablet 2 tablets day 1 and then 1 tablet days 2 through 5 6 each 0   Cholecalciferol 1000 UNIT/10ML LIQD Take by mouth.     Doxylamine Succinate, Sleep, (UNISOM PO) Take by mouth.     fluconazole (DIFLUCAN) 100 MG tablet Take 1 tablet (100 mg total) by mouth daily. 10 tablet 0   GAMMA AMINOBUTYRIC ACID PO Take by mouth.     HCA CALCIUM-MAGNESIUM-ZINC PO Take by mouth. Taking one half tablespoon daily     magic mouthwash w/lidocaine SOLN Take 5 mLs by mouth 4 (four) times daily as needed for mouth pain. 240 mL 1   Melatonin 5 MG CAPS Take by mouth.     Multiple Vitamin (MULTIVITAMIN) tablet Take 1 tablet by mouth daily.  Omega 3-6-9 Fatty Acids (OMEGA 3-6-9 COMPLEX PO) Take 1 capsule by mouth daily.     OVER THE COUNTER MEDICATION      predniSONE (DELTASONE) 10 MG tablet Take 1 tablet by mouth as directed.     predniSONE (DELTASONE) 5 MG tablet 6 tab x 2 day, 5 tab x 2 day, 4 tab x 2 day, 3 tab x 2 day, 2 tab x 2 day, 1 tab x 2 day, stop 42 tablet 0   prochlorperazine  (COMPAZINE) 10 MG tablet Take 1 tablet (10 mg total) by mouth every 6 (six) hours as needed for nausea or vomiting. 30 tablet 0   temazepam (RESTORIL) 15 MG capsule Take 1 capsule (15 mg total) by mouth at bedtime as needed for sleep. 20 capsule 0   No current facility-administered medications for this visit.     SURGICAL HISTORY:  Past Surgical History:  Procedure Laterality Date   APPLICATION OF CRANIAL NAVIGATION Left 04/29/2018   Procedure: APPLICATION OF CRANIAL NAVIGATION;  Surgeon: Earnie Larsson, MD;  Location: [facility];  Service: Neurosurgery;  Laterality: Left;   CRANIOTOMY Left 04/29/2018   Procedure: LEFT CRANIOTOMY FOR  TUMOR BRAIN LAB;  Surgeon: Earnie Larsson, MD;  Location: [facility];  Service: Neurosurgery;  Laterality: Left;    REVIEW OF SYSTEMS:  Constitutional: negative Eyes: negative Ears, nose, mouth, throat, and face: negative Respiratory: negative Cardiovascular: negative Gastrointestinal: negative Genitourinary:negative Integument/breast: negative Hematologic/lymphatic: negative Musculoskeletal:negative Neurological: negative Behavioral/Psych: negative Endocrine: negative Allergic/Immunologic: negative   PHYSICAL EXAMINATION: General appearance: alert, cooperative and no distress Head: Normocephalic, without obvious abnormality, atraumatic Neck: no adenopathy, no JVD, supple, symmetrical, trachea midline and thyroid not enlarged, symmetric, no tenderness/mass/nodules Lymph nodes: Cervical, supraclavicular, and axillary nodes normal. Resp: clear to auscultation bilaterally Back: negative, symmetric, no curvature. ROM normal. No CVA tenderness. Cardio: regular rate and rhythm, S1, S2 normal, no murmur, click, rub or gallop GI: soft, non-tender; bowel sounds normal; no masses,  no organomegaly Extremities: extremities normal, atraumatic, no cyanosis or edema Neurologic: Alert and oriented X 3, normal strength and tone. Normal symmetric reflexes. Normal coordination  and gait  ECOG PERFORMANCE STATUS: 1 - Symptomatic but completely ambulatory  Blood pressure 121/82, pulse 100, temperature (!) 97.1 F (36.2 C), temperature source Oral, resp. rate 18, height 5' 9"  (1.753 m), weight 150 lb 6.4 oz (68.2 kg), SpO2 100 %, unknown if currently breastfeeding.  LABORATORY DATA: Lab Results  Component Value Date   WBC 3.4 (L) 04/24/2019   HGB 14.1 04/24/2019   HCT 41.3 04/24/2019   MCV 92.0 04/24/2019   PLT 248 04/24/2019      Chemistry      Component Value Date/Time   NA 139 04/11/2019 1123   K 4.1 04/11/2019 1123   CL 106 04/11/2019 1123   CO2 24 04/11/2019 1123   BUN 14 04/11/2019 1123   CREATININE 0.92 04/11/2019 1123      Component Value Date/Time   CALCIUM 8.4 (L) 04/11/2019 1123   ALKPHOS 85 04/11/2019 1123   AST 38 04/11/2019 1123   ALT 50 (H) 04/11/2019 1123   BILITOT 0.5 04/11/2019 1123       RADIOGRAPHIC STUDIES: Ct Chest W Contrast  Result Date: 04/20/2019 CLINICAL DATA:  Follow-up metastatic melanoma EXAM: CT CHEST, ABDOMEN, AND PELVIS WITH CONTRAST TECHNIQUE: Multidetector CT imaging of the chest, abdomen and pelvis was performed following the standard protocol during bolus administration of intravenous contrast. CONTRAST:  145m OMNIPAQUE IOHEXOL 300 MG/ML SOLN, additional oral enteric contrast COMPARISON:  01/31/2019, 10/18/2018 FINDINGS: CT CHEST FINDINGS Cardiovascular: No significant vascular findings. Normal heart size. No pericardial effusion. Mediastinum/Nodes: No enlarged mediastinal, hilar, or axillary lymph nodes. Thyroid gland, trachea, and esophagus demonstrate no significant findings. Lungs/Pleura: Minimal interval decrease in size of a lingular and/or pericardial mass, measuring 5.5 x 4.0 cm, previously 5.8 x 4.2 cm, with adjacent airspace consolidation and trace left pleural effusion (series 2, image 42). Musculoskeletal: No chest wall mass or suspicious bone lesions identified. CT ABDOMEN PELVIS FINDINGS Hepatobiliary:  No focal liver abnormality is seen. No gallstones, gallbladder wall thickening, or biliary dilatation. Pancreas: Unremarkable. No pancreatic ductal dilatation or surrounding inflammatory changes. Spleen: Normal in size without focal abnormality. Adrenals/Urinary Tract: Adrenal glands are unremarkable. Kidneys are normal, without renal calculi, focal lesion, or hydronephrosis. Bladder is unremarkable. Stomach/Bowel: Stomach is within normal limits. No evidence of bowel wall thickening, distention, or inflammatory changes. Vascular/Lymphatic: No significant vascular findings are present. No enlarged abdominal or pelvic lymph nodes. Reproductive: No mass or other abnormality. Other: No abdominal wall hernia or abnormality. Trace free fluid in the low pelvis, likely functional in nature. Musculoskeletal: No acute or significant osseous findings. IMPRESSION: 1. Minimal interval decrease in size of a lingular and/or pericardial mass, measuring 5.5 x 4.0 cm, previously 5.8 x 4.2 cm, with adjacent airspace consolidation and trace left pleural effusion (series 2, image 42). Adjacent airspace opacity likely reflects evolving post radiation change. 2. No additional evidence of metastatic disease in the chest, abdomen, or pelvis. Electronically Signed   By: Eddie Candle M.D.   On: 04/20/2019 09:32   Ct Abdomen Pelvis W Contrast  Result Date: 04/20/2019 CLINICAL DATA:  Follow-up metastatic melanoma EXAM: CT CHEST, ABDOMEN, AND PELVIS WITH CONTRAST TECHNIQUE: Multidetector CT imaging of the chest, abdomen and pelvis was performed following the standard protocol during bolus administration of intravenous contrast. CONTRAST:  164m OMNIPAQUE IOHEXOL 300 MG/ML SOLN, additional oral enteric contrast COMPARISON:  01/31/2019, 10/18/2018 FINDINGS: CT CHEST FINDINGS Cardiovascular: No significant vascular findings. Normal heart size. No pericardial effusion. Mediastinum/Nodes: No enlarged mediastinal, hilar, or axillary lymph nodes.  Thyroid gland, trachea, and esophagus demonstrate no significant findings. Lungs/Pleura: Minimal interval decrease in size of a lingular and/or pericardial mass, measuring 5.5 x 4.0 cm, previously 5.8 x 4.2 cm, with adjacent airspace consolidation and trace left pleural effusion (series 2, image 42). Musculoskeletal: No chest wall mass or suspicious bone lesions identified. CT ABDOMEN PELVIS FINDINGS Hepatobiliary: No focal liver abnormality is seen. No gallstones, gallbladder wall thickening, or biliary dilatation. Pancreas: Unremarkable. No pancreatic ductal dilatation or surrounding inflammatory changes. Spleen: Normal in size without focal abnormality. Adrenals/Urinary Tract: Adrenal glands are unremarkable. Kidneys are normal, without renal calculi, focal lesion, or hydronephrosis. Bladder is unremarkable. Stomach/Bowel: Stomach is within normal limits. No evidence of bowel wall thickening, distention, or inflammatory changes. Vascular/Lymphatic: No significant vascular findings are present. No enlarged abdominal or pelvic lymph nodes. Reproductive: No mass or other abnormality. Other: No abdominal wall hernia or abnormality. Trace free fluid in the low pelvis, likely functional in nature. Musculoskeletal: No acute or significant osseous findings. IMPRESSION: 1. Minimal interval decrease in size of a lingular and/or pericardial mass, measuring 5.5 x 4.0 cm, previously 5.8 x 4.2 cm, with adjacent airspace consolidation and trace left pleural effusion (series 2, image 42). Adjacent airspace opacity likely reflects evolving post radiation change. 2. No additional evidence of metastatic disease in the chest, abdomen, or pelvis. Electronically Signed   By: AEddie CandleM.D.   On: 04/20/2019 09:32  ASSESSMENT AND PLAN: This is a very pleasant 43 years old white female with highly suspicious metastatic malignant melanoma presented with large mass in the left upper/left lower lobe and mediastinal lymphadenopathy  as well as solitary brain metastasis status post left temporal craniotomy and resection of tumor on 04/29/2018 and she is recovering well from her surgery. The patient completed stereotactic radiotherapy to the resection cavity next week under the care of Dr. Lisbeth Renshaw. The patient underwent treatment with immunotherapy with Keytruda 200 mg IV every 3 weeks status post 3 cycles.   Her scan after cycle #3 showed enlargement of the left upper lobe lung mass.  This was also suspicious for pseudo-progression on immunotherapy.  The patient was started on a palliative course of radiotherapy to the left upper lobe lung mass and she tolerated this treatment fairly well.  She was seen by Dr. Emelda Brothers at [facility] and she recommended for the patient to resume her treatment with [facility] for now until she undergoes further molecular studies.  The patient was treated with 2 more cycles of Keytruda and tolerated the treatment well. Her recent CT scan of the chest, abdomen and pelvis showed some improvement in the left upper lobe lung mass but there is still concern about pericardial invasion. I personally and independently reviewed the scan images and discussed the result and showed the images to the patient and her boyfriend today.  She is still not a good candidate for surgical resection because of the pericardial invasion. The final pathology report and recommendation from [facility] was consistent with metastatic melanoma and the recommendation is to switch the patient to a combination immunotherapy with Ipilumumab and nivolumab. The patient is currently undergoing treatment with immunotherapy with ipilimumab and nivolumab status post 2 cycles.  Her treatment was on hold for more than 6 weeks secondary to grade 3 hepatic dysfunction.  She had improvement of her liver enzyme after a prolonged treatment with a steroid with a tapering schedule. She resumed her treatment again with single agent  nivolumab.  She is status post 7 cycles of treatment. She continues to tolerate this treatment well with no concerning adverse effects. She had repeat CT scan of the chest, abdomen and pelvis that showed no concerning findings for disease patient but there was further mild decrease in the size of the left lung mass. I recommended for the patient to continue her current treatment with nivolumab and she will proceed with cycle #8 today. She will come back for follow-up visit in 2 weeks for evaluation before the next cycle of her treatment. For the oral thrush, I will start the patient on Diflucan 100 mg p.o. daily. For the sore throat, she will continue with prednisone 10 mg p.o. daily for 1 week. She was advised to call immediately if she has any concerning symptoms in the interval. The patient voices understanding of current disease status and treatment options and is in agreement with the current care plan. All questions were answered. The patient knows to call the clinic with any problems, questions or concerns. We can certainly see the patient much sooner if necessary.  Disclaimer: This note was dictated with voice recognition software. Similar sounding words can inadvertently be transcribed and may not be corrected upon review.

## 2019-04-24 NOTE — Patient Instructions (Signed)
[facility] Discharge Instructions for Patients Receiving Chemotherapy  Today you received the following chemotherapy agents Nivolumab (OPDIVO).  To help prevent nausea and vomiting after your treatment, we encourage you to take your nausea medication as prescribed.   If you develop nausea and vomiting that is not controlled by your nausea medication, call the clinic.   BELOW ARE SYMPTOMS THAT SHOULD BE REPORTED IMMEDIATELY:  *FEVER GREATER THAN 100.5 F  *CHILLS WITH OR WITHOUT FEVER  NAUSEA AND VOMITING THAT IS NOT CONTROLLED WITH YOUR NAUSEA MEDICATION  *UNUSUAL SHORTNESS OF BREATH  *UNUSUAL BRUISING OR BLEEDING  TENDERNESS IN MOUTH AND THROAT WITH OR WITHOUT PRESENCE OF ULCERS  *URINARY PROBLEMS  *BOWEL PROBLEMS  UNUSUAL RASH Items with * indicate a potential emergency and should be followed up as soon as possible.  Feel free to call the clinic should you have any questions or concerns. The clinic phone number is [phone].  Please show the [facility] at check-in to the Emergency Department and triage nurse.  Coronavirus (COVID-19) Are you at risk?  Are you at risk for the Coronavirus (COVID-19)?  To be considered HIGH RISK for Coronavirus (COVID-19), you have to meet the following criteria:  . Traveled to [place], [place], [place], [place] or [place]; or in the [place] to [place], [place], [place], or [place]; and have fever, cough, and shortness of breath within the last 2 weeks of travel OR . Been in close contact with a person diagnosed with COVID-19 within the last 2 weeks and have fever, cough, and shortness of breath . IF YOU DO NOT MEET THESE CRITERIA, YOU ARE CONSIDERED LOW RISK FOR COVID-19.  What to do if you are HIGH RISK for COVID-19?  Marland Kitchen If you are having a medical emergency, call 911. . Seek medical care right away. Before you go to a doctor's office, urgent care or emergency department, call ahead and tell them  about your recent travel, contact with someone diagnosed with COVID-19, and your symptoms. You should receive instructions from your physician's office regarding next steps of care.  . When you arrive at healthcare provider, tell the healthcare staff immediately you have returned from visiting [place], [place], [place], [place] or [place]; or traveled in the [place] to [place], [place], [place], or [place]; in the last two weeks or you have been in close contact with a person diagnosed with COVID-19 in the last 2 weeks.   . Tell the health care staff about your symptoms: fever, cough and shortness of breath. . After you have been seen by a medical provider, you will be either: o Tested for (COVID-19) and discharged home on quarantine except to seek medical care if symptoms worsen, and asked to  - Stay home and avoid contact with others until you get your results (4-5 days)  - Avoid travel on public transportation if possible (such as bus, train, or airplane) or o Sent to the Emergency Department by EMS for evaluation, COVID-19 testing, and possible admission depending on your condition and test results.  What to do if you are LOW RISK for COVID-19?  Reduce your risk of any infection by using the same precautions used for avoiding the common cold or flu:  Marland Kitchen Wash your hands often with soap and warm water for at least 20 seconds.  If soap and water are not readily available, use an alcohol-based hand sanitizer with at least 60% alcohol.  . If coughing or  sneezing, cover your mouth and nose by coughing or sneezing into the elbow areas of your shirt or coat, into a tissue or into your sleeve (not your hands). . Avoid shaking hands with others and consider head nods or verbal greetings only. . Avoid touching your eyes, nose, or mouth with unwashed hands.  . Avoid close contact with people who are sick. . Avoid places or events with large numbers of people in one location, like concerts or  sporting events. . Carefully consider travel plans you have or are making. . If you are planning any travel outside or inside the [place], visit the CDC's Travelers' Health webpage for the latest health notices. . If you have some symptoms but not all symptoms, continue to monitor at home and seek medical attention if your symptoms worsen. . If you are having a medical emergency, call 911.   [place] / e-Visit: [email or web address]         [facility]: [facility]: [phone]                   [facility]: [place] Discharge Instructions for Patients Receiving Chemotherapy  Today you received the following chemotherapy agents Opdivo  To help prevent nausea and vomiting after your treatment, we encourage you to take your nausea medication as directed.    If you develop nausea and vomiting that is not controlled by your nausea medication, call the clinic.   BELOW ARE SYMPTOMS THAT SHOULD BE REPORTED IMMEDIATELY:  *FEVER GREATER THAN 100.5 F  *CHILLS WITH OR WITHOUT FEVER  NAUSEA AND VOMITING THAT IS NOT CONTROLLED WITH YOUR NAUSEA MEDICATION  *UNUSUAL SHORTNESS OF BREATH  *UNUSUAL BRUISING OR BLEEDING  TENDERNESS IN MOUTH AND THROAT WITH OR WITHOUT PRESENCE OF ULCERS  *URINARY PROBLEMS  *BOWEL PROBLEMS  UNUSUAL RASH Items with * indicate a potential emergency and should be followed up as soon as possible.  Feel free to call the clinic should you have any questions or concerns. The clinic phone number is [phone].  Please show the [facility] at check-in to the Emergency Department and triage nurse.

## 2019-04-25 ENCOUNTER — Telehealth: Payer: Self-pay | Admitting: Internal Medicine

## 2019-04-25 NOTE — Telephone Encounter (Signed)
Added additional cycles per 5/11 treatment plan / los - pt to get an an updated schedule next visit.

## 2019-04-27 ENCOUNTER — Other Ambulatory Visit: Payer: Self-pay | Admitting: Radiation Therapy

## 2019-04-27 DIAGNOSIS — C7931 Secondary malignant neoplasm of brain: Secondary | ICD-10-CM

## 2019-04-27 DIAGNOSIS — C7949 Secondary malignant neoplasm of other parts of nervous system: Secondary | ICD-10-CM

## 2019-05-01 ENCOUNTER — Telehealth: Payer: Self-pay | Admitting: Medical Oncology

## 2019-05-01 ENCOUNTER — Other Ambulatory Visit: Payer: Self-pay | Admitting: Medical Oncology

## 2019-05-01 DIAGNOSIS — B370 Candidal stomatitis: Secondary | ICD-10-CM

## 2019-05-01 MED ORDER — FLUCONAZOLE 100 MG PO TABS: 100.0000 mg | ORAL_TABLET | Freq: Every day | ORAL | 0 refills | Status: DC

## 2019-05-01 NOTE — Telephone Encounter (Addendum)
Note indicates pt thinks she has thrush. Tongue is burning making it difficult to eat and drink.  Per Dr Julien Nordmann ,Diflucan sent to pharmacy and pt notified.

## 2019-05-02 ENCOUNTER — Telehealth: Payer: Self-pay | Admitting: Radiation Therapy

## 2019-05-02 NOTE — Telephone Encounter (Signed)
Spoke with Leslie [name] about her upcoming brain MRI and follow-up with Leslie [name] in July. She was already aware of the visits due to MyChart, but happy for the call.   Mont Dutton R.T.(R)(T) Special Procedures Navigator

## 2019-05-03 ENCOUNTER — Ambulatory Visit: Payer: BLUE CROSS/BLUE SHIELD | Admitting: Internal Medicine

## 2019-05-03 ENCOUNTER — Other Ambulatory Visit: Payer: BLUE CROSS/BLUE SHIELD

## 2019-05-03 ENCOUNTER — Ambulatory Visit: Payer: BLUE CROSS/BLUE SHIELD

## 2019-05-10 ENCOUNTER — Inpatient Hospital Stay: Payer: BLUE CROSS/BLUE SHIELD

## 2019-05-10 ENCOUNTER — Inpatient Hospital Stay (HOSPITAL_BASED_OUTPATIENT_CLINIC_OR_DEPARTMENT_OTHER): Payer: BLUE CROSS/BLUE SHIELD | Admitting: Physician Assistant

## 2019-05-10 ENCOUNTER — Ambulatory Visit: Payer: BLUE CROSS/BLUE SHIELD

## 2019-05-10 ENCOUNTER — Other Ambulatory Visit: Payer: Self-pay

## 2019-05-10 ENCOUNTER — Encounter: Payer: Self-pay | Admitting: Physician Assistant

## 2019-05-10 VITALS — BP 112/65 | HR 64 | Temp 98.2°F | Resp 18 | Ht 69.0 in | Wt 148.6 lb

## 2019-05-10 DIAGNOSIS — Z79899 Other long term (current) drug therapy: Secondary | ICD-10-CM | POA: Diagnosis not present

## 2019-05-10 DIAGNOSIS — Z5112 Encounter for antineoplastic immunotherapy: Secondary | ICD-10-CM

## 2019-05-10 DIAGNOSIS — C7802 Secondary malignant neoplasm of left lung: Secondary | ICD-10-CM

## 2019-05-10 DIAGNOSIS — K7689 Other specified diseases of liver: Secondary | ICD-10-CM

## 2019-05-10 DIAGNOSIS — C439 Malignant melanoma of skin, unspecified: Secondary | ICD-10-CM

## 2019-05-10 DIAGNOSIS — B370 Candidal stomatitis: Secondary | ICD-10-CM | POA: Diagnosis not present

## 2019-05-10 DIAGNOSIS — Z923 Personal history of irradiation: Secondary | ICD-10-CM | POA: Diagnosis not present

## 2019-05-10 DIAGNOSIS — C3432 Malignant neoplasm of lower lobe, left bronchus or lung: Secondary | ICD-10-CM

## 2019-05-10 DIAGNOSIS — C7931 Secondary malignant neoplasm of brain: Secondary | ICD-10-CM

## 2019-05-10 LAB — CBC WITH DIFFERENTIAL (CANCER CENTER ONLY)
Abs Immature Granulocytes: 0.01 10*3/uL (ref 0.00–0.07)
Basophils Absolute: 0 10*3/uL (ref 0.0–0.1)
Basophils Relative: 1 %
Eosinophils Absolute: 0.1 10*3/uL (ref 0.0–0.5)
Eosinophils Relative: 3 %
HCT: 42.9 % (ref 36.0–46.0)
Hemoglobin: 14.5 g/dL (ref 12.0–15.0)
Immature Granulocytes: 0 %
Lymphocytes Relative: 17 %
Lymphs Abs: 0.6 10*3/uL — ABNORMAL LOW (ref 0.7–4.0)
MCH: 30.9 pg (ref 26.0–34.0)
MCHC: 33.8 g/dL (ref 30.0–36.0)
MCV: 91.5 fL (ref 80.0–100.0)
Monocytes Absolute: 0.6 10*3/uL (ref 0.1–1.0)
Monocytes Relative: 18 %
Neutro Abs: 2.1 10*3/uL (ref 1.7–7.7)
Neutrophils Relative %: 61 %
Platelet Count: 260 10*3/uL (ref 150–400)
RBC: 4.69 MIL/uL (ref 3.87–5.11)
RDW: 12.9 % (ref 11.5–15.5)
WBC Count: 3.5 10*3/uL — ABNORMAL LOW (ref 4.0–10.5)
nRBC: 0 % (ref 0.0–0.2)

## 2019-05-10 LAB — CMP (CANCER CENTER ONLY)
ALT: 54 U/L — ABNORMAL HIGH (ref 0–44)
AST: 49 U/L — ABNORMAL HIGH (ref 15–41)
Albumin: 3.7 g/dL (ref 3.5–5.0)
Alkaline Phosphatase: 75 U/L (ref 38–126)
Anion gap: 7 (ref 5–15)
BUN: 13 mg/dL (ref 6–20)
CO2: 26 mmol/L (ref 22–32)
Calcium: 8.8 mg/dL — ABNORMAL LOW (ref 8.9–10.3)
Chloride: 105 mmol/L (ref 98–111)
Creatinine: 0.93 mg/dL (ref 0.44–1.00)
GFR, Est AFR Am: >60 mL/min (ref >60)
GFR, Estimated: >60 mL/min (ref >60)
Glucose, Bld: 79 mg/dL (ref 70–99)
Potassium: 4.3 mmol/L (ref 3.5–5.1)
Sodium: 138 mmol/L (ref 135–145)
Total Bilirubin: 0.3 mg/dL (ref 0.3–1.2)
Total Protein: 6.5 g/dL (ref 6.5–8.1)

## 2019-05-10 MED ORDER — SODIUM CHLORIDE 0.9 % IV SOLN
240.0000 mg | Freq: Once | INTRAVENOUS | Status: AC
  Administered 2019-05-10: 240 mg via INTRAVENOUS
  Filled 2019-05-10: qty 24

## 2019-05-10 MED ORDER — SODIUM CHLORIDE 0.9 % IV SOLN
Freq: Once | INTRAVENOUS | Status: AC
  Administered 2019-05-10: 10:00:00 via INTRAVENOUS
  Filled 2019-05-10: qty 250

## 2019-05-10 MED ORDER — FLUCONAZOLE 100 MG PO TABS: 100.0000 mg | ORAL_TABLET | Freq: Every day | ORAL | 0 refills | Status: DC

## 2019-05-10 NOTE — Progress Notes (Signed)
[facility] OFFICE PROGRESS NOTE  System, Pcp Not In No address on file  DIAGNOSIS: Metastatic high-grade neoplasm consistent with metastatic malignant melanoma based on the most recent pathology report from [facility] in October 2019, presented with large left left upper/left lower lobe mass with a hypermetabolic AP window lymph node as well as solitary metastatic brain lesion diagnosed in May 2019.  Biomarker Findings Microsatellite status - MS-Stable Tumor Mutational Burden - TMB-Intermediate (16 Muts/Mb) Genomic Findings For a complete list of the genes assayed, please refer to the Appendix. CD274 (PD-L1) amplification NRAS Q61R PDCD1LG2 (PD-L2) amplification MYC amplification EPHB1 amplification JAK2 amplification RB1 Q93*, N562* TERT promoter -146C>T TP53 C275W   PRIOR THERAPY:  1) left temporal craniotomy with resection of tumor with intraoperative stereotactic guidance for volumetric resection under the care of Dr. Annette Stable on 04/29/2018. 2) First-line treatment with immunotherapy with Keytruda 200 mg IV every 3 weeks.  First dose June 02, 2018.  Status post 3 cycles.  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 3) palliative radiotherapy to the left lung mass under the care of Dr. Lisbeth Renshaw completed on 08/24/2018. 4) Resuming her treatment again with Keytruda 200 mg IV every 3 weeks, first dose 09/08/2018.  Status post 2 cycles.  CURRENT THERAPY: Treatment with immunotherapy with ipilimumab 3 mg/KG and nivolumab 1 mg/KG every 3 weeks for the first 4 cycles followed by maintenance nivolumab 240 mg IV every 2 weeks.  First dose of this treatment October 26, 2018. Status post 8 cycles.  Ipilimumab was discontinued after cycle #2 for the significant liver dysfunction.  Starting from cycle #3 the patient is on treatment with single agent nivolumab.  INTERVAL HISTORY: Leslie [name] 43 y.o. female returns to the clinic  for a follow-up visit.  The patient is feeling fairly well today without any concerning complaints. She recently developed thrush and recently completed a course of Diflucan.  Since this time, her thrush has substantially improved. Otherwise, she is tolerating her last cycle of treatment with immunotherapy fairly well. She denies any fever, chills, night sweats, or weight loss.  She denies any chest pain, shortness of breath, cough, or hemoptysis.  She denies any nausea, vomiting, diarrhea, or constipation. She denies any headache or visual changes. She is scheduled for a restaging Brain MRI in July.  She denies any rashes or skin changes.  She is here today for evaluation before starting cycle #9 today as scheduled.  MEDICAL HISTORY: Past Medical History:  Diagnosis Date  . Migraines   . Yeast infection     ALLERGIES:  has No Known Allergies.  MEDICATIONS:  Current Outpatient Medications  Medication Sig Dispense Refill  . Ascorbic Acid (LIQUID C 500) 500 MG/15ML LIQD Take 1,000 mg by mouth daily.    Marland Kitchen azithromycin (ZITHROMAX Z-PAK) 250 MG tablet 2 tablets day 1 and then 1 tablet days 2 through 5 6 each 0  . Cholecalciferol 1000 UNIT/10ML LIQD Take by mouth.    . Doxylamine Succinate, Sleep, (UNISOM PO) Take by mouth.    . fluconazole (DIFLUCAN) 100 MG tablet Take 1 tablet (100 mg total) by mouth daily. 10 tablet 0  . GAMMA AMINOBUTYRIC ACID PO Take by mouth.    Marland Kitchen HCA CALCIUM-MAGNESIUM-ZINC PO Take by mouth. Taking one half tablespoon daily    . magic mouthwash w/lidocaine SOLN Take 5 mLs by mouth 4 (four) times daily as needed for mouth pain. 240 mL 1  . Melatonin 5 MG CAPS Take by mouth.    Marland Kitchen  Multiple Vitamin (MULTIVITAMIN) tablet Take 1 tablet by mouth daily.    . Omega 3-6-9 Fatty Acids (OMEGA 3-6-9 COMPLEX PO) Take 1 capsule by mouth daily.    Marland Kitchen OVER THE COUNTER MEDICATION     . predniSONE (DELTASONE) 10 MG tablet Take 1 tablet by mouth as directed.    . predniSONE (DELTASONE) 5 MG  tablet 6 tab x 2 day, 5 tab x 2 day, 4 tab x 2 day, 3 tab x 2 day, 2 tab x 2 day, 1 tab x 2 day, stop 42 tablet 0  . prochlorperazine (COMPAZINE) 10 MG tablet Take 1 tablet (10 mg total) by mouth every 6 (six) hours as needed for nausea or vomiting. 30 tablet 0  . temazepam (RESTORIL) 15 MG capsule Take 1 capsule (15 mg total) by mouth at bedtime as needed for sleep. 20 capsule 0   No current facility-administered medications for this visit.     SURGICAL HISTORY:  Past Surgical History:  Procedure Laterality Date  . APPLICATION OF CRANIAL NAVIGATION Left 04/29/2018   Procedure: APPLICATION OF CRANIAL NAVIGATION;  Surgeon: Earnie Larsson, MD;  Location: [facility];  Service: Neurosurgery;  Laterality: Left;  . CRANIOTOMY Left 04/29/2018   Procedure: LEFT CRANIOTOMY FOR  TUMOR BRAIN LAB;  Surgeon: Earnie Larsson, MD;  Location: [facility];  Service: Neurosurgery;  Laterality: Left;    REVIEW OF SYSTEMS:   Review of Systems  Constitutional: Negative for appetite change, chills, fatigue, fever and unexpected weight change.  HENT: Positive for thrush (resolved) after her last treatment. Negative for nosebleeds, sore throat and trouble swallowing.   Eyes: Negative for eye problems and icterus.  Respiratory: Negative for cough, hemoptysis, shortness of breath and wheezing.   Cardiovascular: Negative for chest pain and leg swelling.  Gastrointestinal: Negative for abdominal pain, constipation, diarrhea, nausea and vomiting.  Genitourinary: Negative for bladder incontinence, difficulty urinating, dysuria, frequency and hematuria.   Musculoskeletal: Negative for back pain, gait problem, neck pain and neck stiffness.  Skin: Negative for itching and rash.  Neurological: Negative for dizziness, extremity weakness, gait problem, headaches, light-headedness and seizures.  Hematological: Negative for adenopathy. Does not bruise/bleed easily.  Psychiatric/Behavioral: Negative for confusion, depression and sleep  disturbance. The patient is not nervous/anxious.     PHYSICAL EXAMINATION:  Blood pressure 112/65, pulse 64, temperature 98.2 F (36.8 C), temperature source Oral, resp. rate 18, height 5' 9"  (1.753 m), weight 148 lb 9.6 oz (67.4 kg), SpO2 100 %, unknown if currently breastfeeding.  ECOG PERFORMANCE STATUS: 1 - Symptomatic but completely ambulatory  Physical Exam  Constitutional: Oriented to person, place, and time and well-developed, well-nourished, and in no distress. HENT:  Head: Normocephalic and atraumatic.  Mouth/Throat: Oropharynx is clear and moist. No oropharyngeal exudate. No thrush. Eyes: Conjunctivae are normal. Right eye exhibits no discharge. Left eye exhibits no discharge. No scleral icterus.  Neck: Normal range of motion. Neck supple.  Cardiovascular: Normal rate, regular rhythm, normal heart sounds and intact distal pulses.   Pulmonary/Chest: Effort normal and breath sounds normal. No respiratory distress. No wheezes. No rales.  Abdominal: Soft. Bowel sounds are normal. Exhibits no distension and no mass. There is no tenderness.  Musculoskeletal: Normal range of motion. Exhibits no edema.  Lymphadenopathy:    No cervical adenopathy.  Neurological: Alert and oriented to person, place, and time. Exhibits normal muscle tone. Gait normal. Coordination normal.  Skin: Skin is warm and dry. No rash noted. Not diaphoretic. No erythema. No pallor.  Psychiatric: Mood, memory and  judgment normal.  Vitals reviewed.  LABORATORY DATA: Lab Results  Component Value Date   WBC 3.5 (L) 05/10/2019   HGB 14.5 05/10/2019   HCT 42.9 05/10/2019   MCV 91.5 05/10/2019   PLT 260 05/10/2019      Chemistry      Component Value Date/Time   NA 138 05/10/2019 0838   K 4.3 05/10/2019 0838   CL 105 05/10/2019 0838   CO2 26 05/10/2019 0838   BUN 13 05/10/2019 0838   CREATININE 0.93 05/10/2019 0838      Component Value Date/Time   CALCIUM 8.8 (L) 05/10/2019 0838   ALKPHOS 75 05/10/2019  0838   AST 49 (H) 05/10/2019 0838   ALT 54 (H) 05/10/2019 0838   BILITOT 0.3 05/10/2019 0838       RADIOGRAPHIC STUDIES:  Ct Chest W Contrast  Result Date: 04/20/2019 CLINICAL DATA:  Follow-up metastatic melanoma EXAM: CT CHEST, ABDOMEN, AND PELVIS WITH CONTRAST TECHNIQUE: Multidetector CT imaging of the chest, abdomen and pelvis was performed following the standard protocol during bolus administration of intravenous contrast. CONTRAST:  148m OMNIPAQUE IOHEXOL 300 MG/ML SOLN, additional oral enteric contrast COMPARISON:  01/31/2019, 10/18/2018 FINDINGS: CT CHEST FINDINGS Cardiovascular: No significant vascular findings. Normal heart size. No pericardial effusion. Mediastinum/Nodes: No enlarged mediastinal, hilar, or axillary lymph nodes. Thyroid gland, trachea, and esophagus demonstrate no significant findings. Lungs/Pleura: Minimal interval decrease in size of a lingular and/or pericardial mass, measuring 5.5 x 4.0 cm, previously 5.8 x 4.2 cm, with adjacent airspace consolidation and trace left pleural effusion (series 2, image 42). Musculoskeletal: No chest wall mass or suspicious bone lesions identified. CT ABDOMEN PELVIS FINDINGS Hepatobiliary: No focal liver abnormality is seen. No gallstones, gallbladder wall thickening, or biliary dilatation. Pancreas: Unremarkable. No pancreatic ductal dilatation or surrounding inflammatory changes. Spleen: Normal in size without focal abnormality. Adrenals/Urinary Tract: Adrenal glands are unremarkable. Kidneys are normal, without renal calculi, focal lesion, or hydronephrosis. Bladder is unremarkable. Stomach/Bowel: Stomach is within normal limits. No evidence of bowel wall thickening, distention, or inflammatory changes. Vascular/Lymphatic: No significant vascular findings are present. No enlarged abdominal or pelvic lymph nodes. Reproductive: No mass or other abnormality. Other: No abdominal wall hernia or abnormality. Trace free fluid in the low pelvis,  likely functional in nature. Musculoskeletal: No acute or significant osseous findings. IMPRESSION: 1. Minimal interval decrease in size of a lingular and/or pericardial mass, measuring 5.5 x 4.0 cm, previously 5.8 x 4.2 cm, with adjacent airspace consolidation and trace left pleural effusion (series 2, image 42). Adjacent airspace opacity likely reflects evolving post radiation change. 2. No additional evidence of metastatic disease in the chest, abdomen, or pelvis. Electronically Signed   By: AEddie CandleM.D.   On: 04/20/2019 09:32   Ct Abdomen Pelvis W Contrast  Result Date: 04/20/2019 CLINICAL DATA:  Follow-up metastatic melanoma EXAM: CT CHEST, ABDOMEN, AND PELVIS WITH CONTRAST TECHNIQUE: Multidetector CT imaging of the chest, abdomen and pelvis was performed following the standard protocol during bolus administration of intravenous contrast. CONTRAST:  1079mOMNIPAQUE IOHEXOL 300 MG/ML SOLN, additional oral enteric contrast COMPARISON:  01/31/2019, 10/18/2018 FINDINGS: CT CHEST FINDINGS Cardiovascular: No significant vascular findings. Normal heart size. No pericardial effusion. Mediastinum/Nodes: No enlarged mediastinal, hilar, or axillary lymph nodes. Thyroid gland, trachea, and esophagus demonstrate no significant findings. Lungs/Pleura: Minimal interval decrease in size of a lingular and/or pericardial mass, measuring 5.5 x 4.0 cm, previously 5.8 x 4.2 cm, with adjacent airspace consolidation and trace left pleural effusion (series 2, image 42). Musculoskeletal:  No chest wall mass or suspicious bone lesions identified. CT ABDOMEN PELVIS FINDINGS Hepatobiliary: No focal liver abnormality is seen. No gallstones, gallbladder wall thickening, or biliary dilatation. Pancreas: Unremarkable. No pancreatic ductal dilatation or surrounding inflammatory changes. Spleen: Normal in size without focal abnormality. Adrenals/Urinary Tract: Adrenal glands are unremarkable. Kidneys are normal, without renal calculi,  focal lesion, or hydronephrosis. Bladder is unremarkable. Stomach/Bowel: Stomach is within normal limits. No evidence of bowel wall thickening, distention, or inflammatory changes. Vascular/Lymphatic: No significant vascular findings are present. No enlarged abdominal or pelvic lymph nodes. Reproductive: No mass or other abnormality. Other: No abdominal wall hernia or abnormality. Trace free fluid in the low pelvis, likely functional in nature. Musculoskeletal: No acute or significant osseous findings. IMPRESSION: 1. Minimal interval decrease in size of a lingular and/or pericardial mass, measuring 5.5 x 4.0 cm, previously 5.8 x 4.2 cm, with adjacent airspace consolidation and trace left pleural effusion (series 2, image 42). Adjacent airspace opacity likely reflects evolving post radiation change. 2. No additional evidence of metastatic disease in the chest, abdomen, or pelvis. Electronically Signed   By: Eddie Candle M.D.   On: 04/20/2019 09:32     ASSESSMENT/PLAN:  This is a very pleasant 43 year old Caucasian female who presented with a large mass in the left upper/lower lobe and mediastinal adenopathy as well as a solitary brain metastasis highly suspicious for malignant melanoma.  The final pathology report from [facility] showed that her malignancy was consistent with metastatic melanoma.  She is status post a left temporal craniotomy and resection of the tumor on 04/29/2018. The patient also completed stereotactic radiotherapy to the resection cavity under the care of Dr. Lisbeth Renshaw. She previously underwent treatment with immunotherapy with Keytruda 200 mg IV every 3 weeks.  She is status post 3 cycles. CT scan after cycle #3 showed enlargement of the left upper lobe lung mass.  This was suspicious for pseudo-progression on immunotherapy. The patient was started on palliative course of radiotherapy to the left upper lobe mass and she tolerated this treatment fairly well.  She was seen by  Dr. Twana First at [facility] and she recommended for the patient to resume her treatment with [facility] until she undergoes further molecular studies.  The patient was treated with 2 more cycles of Keytruda and tolerated the treatment well. The patient then had a CT scan performed which showed pericardial invasion. Therefore the patient is not a good candidate for surgical resection.   From her final pathology report and recommendations from [facility], the patient was switched to immunotherapy with ipilimumab and nivolumab. She is status post 2 cycles. Treatment was on hold for more than 6 weeks secondary to grade 3 hepatic dysfunction.  She had improvement of her liver enzymes after a prolonged treatment with a tapering schedule of steroids.  She recently resumed treatment and is currently undergoing single agent nivolumab 1 mg/kg IV every 2 weeks.  She is status post 6 cycles with single agent nivolumab.  She has been tolerating treatment well.  The patient was seen with Dr. Julien Nordmann today.  Labs were reviewed with the patient.  We recommend that she proceed with cycle #9 today as scheduled.   We will see her back for a follow up in 2 weeks for evaluation before starting cycle #10.  The patient was advised to call immediately if she has any concerning symptoms in the interval. The patient voices understanding of current disease status and treatment options and is in agreement with the current  care plan. All questions were answered. The patient knows to call the clinic with any problems, questions or concerns. We can certainly see the patient much sooner if necessary   No orders of the defined types were placed in this encounter.    Cassandra L Heiling[place]etter, PA-C 05/10/19  ADDENDUM: Hematology/Oncology Attending: I had a face-to-face encounter with the patient today.  I recommended her care plan.  Very pleasant 43 years old white female with metastatic malignant melanoma  and she is currently undergoing treatment with immunotherapy with maintenance single agent nivolumab 2 weeks status post 6 cycles.  The patient has been tolerating this treatment well with no concerning adverse effects. She has oral thrush after the last dose of her treatment after prolonged treatment with the steroids.  She is requesting Diflucan to be available with her at home for the side effects and will give her a refill for medication. We will see her back for follow-up visit in 2 weeks for evaluation before the next cycle of her treatment. She was advised to call immediately if she has any concerning symptoms in the interval.  Disclaimer: This note was dictated with voice recognition software. Similar sounding words can inadvertently be transcribed and may be missed upon review. Eilleen Kempf, MD 05/10/19

## 2019-05-10 NOTE — Patient Instructions (Signed)
[facility] Discharge Instructions for Patients Receiving Chemotherapy  Today you received the following chemotherapy agents Nivolumab (OPDIVO).  To help prevent nausea and vomiting after your treatment, we encourage you to take your nausea medication as prescribed.   If you develop nausea and vomiting that is not controlled by your nausea medication, call the clinic.   BELOW ARE SYMPTOMS THAT SHOULD BE REPORTED IMMEDIATELY:  *FEVER GREATER THAN 100.5 F  *CHILLS WITH OR WITHOUT FEVER  NAUSEA AND VOMITING THAT IS NOT CONTROLLED WITH YOUR NAUSEA MEDICATION  *UNUSUAL SHORTNESS OF BREATH  *UNUSUAL BRUISING OR BLEEDING  TENDERNESS IN MOUTH AND THROAT WITH OR WITHOUT PRESENCE OF ULCERS  *URINARY PROBLEMS  *BOWEL PROBLEMS  UNUSUAL RASH Items with * indicate a potential emergency and should be followed up as soon as possible.  Feel free to call the clinic should you have any questions or concerns. The clinic phone number is [phone].  Please show the [place] at check-in to the Emergency Department and triage nurse.  Coronavirus (COVID-19) Are you at risk?  Are you at risk for the Coronavirus (COVID-19)?  To be considered HIGH RISK for Coronavirus (COVID-19), you have to meet the following criteria:  . Traveled to [place], [place], [place], [place] or [place]; or in the [place] to [place], [place], [place], or [place]; and have fever, cough, and shortness of breath within the last 2 weeks of travel OR . Been in close contact with a person diagnosed with COVID-19 within the last 2 weeks and have fever, cough, and shortness of breath . IF YOU DO NOT MEET THESE CRITERIA, YOU ARE CONSIDERED LOW RISK FOR COVID-19.  What to do if you are HIGH RISK for COVID-19?  Marland Kitchen If you are having a medical emergency, call 911. . Seek medical care right away. Before you go to a doctor's office, urgent care or emergency department, call ahead and tell them  about your recent travel, contact with someone diagnosed with COVID-19, and your symptoms. You should receive instructions from your physician's office regarding next steps of care.  . When you arrive at healthcare provider, tell the healthcare staff immediately you have returned from visiting [place], [place], [place], [place] or [place]; or traveled in the [place] to [place], [place], [place], or [place]; in the last two weeks or you have been in close contact with a person diagnosed with COVID-19 in the last 2 weeks.   . Tell the health care staff about your symptoms: fever, cough and shortness of breath. . After you have been seen by a medical provider, you will be either: o Tested for (COVID-19) and discharged home on quarantine except to seek medical care if symptoms worsen, and asked to  - Stay home and avoid contact with others until you get your results (4-5 days)  - Avoid travel on public transportation if possible (such as bus, train, or airplane) or o Sent to the Emergency Department by EMS for evaluation, COVID-19 testing, and possible admission depending on your condition and test results.  What to do if you are LOW RISK for COVID-19?  Reduce your risk of any infection by using the same precautions used for avoiding the common cold or flu:  Marland Kitchen Wash your hands often with soap and warm water for at least 20 seconds.  If soap and water are not readily available, use an alcohol-based hand sanitizer with at least 60% alcohol.  . If coughing or  sneezing, cover your mouth and nose by coughing or sneezing into the elbow areas of your shirt or coat, into a tissue or into your sleeve (not your hands). . Avoid shaking hands with others and consider head nods or verbal greetings only. . Avoid touching your eyes, nose, or mouth with unwashed hands.  . Avoid close contact with people who are sick. . Avoid places or events with large numbers of people in one location, like concerts or  sporting events. . Carefully consider travel plans you have or are making. . If you are planning any travel outside or inside the [place], visit the CDC's Travelers' Health webpage for the latest health notices. . If you have some symptoms but not all symptoms, continue to monitor at home and seek medical attention if your symptoms worsen. . If you are having a medical emergency, call 911.   [facility] / e-Visit: [email or web address]         [facility]: [facility]: [phone]                   [facility]: [facility] Discharge Instructions for Patients Receiving Chemotherapy  Today you received the following chemotherapy agents Opdivo  To help prevent nausea and vomiting after your treatment, we encourage you to take your nausea medication as directed.    If you develop nausea and vomiting that is not controlled by your nausea medication, call the clinic.   BELOW ARE SYMPTOMS THAT SHOULD BE REPORTED IMMEDIATELY:  *FEVER GREATER THAN 100.5 F  *CHILLS WITH OR WITHOUT FEVER  NAUSEA AND VOMITING THAT IS NOT CONTROLLED WITH YOUR NAUSEA MEDICATION  *UNUSUAL SHORTNESS OF BREATH  *UNUSUAL BRUISING OR BLEEDING  TENDERNESS IN MOUTH AND THROAT WITH OR WITHOUT PRESENCE OF ULCERS  *URINARY PROBLEMS  *BOWEL PROBLEMS  UNUSUAL RASH Items with * indicate a potential emergency and should be followed up as soon as possible.  Feel free to call the clinic should you have any questions or concerns. The clinic phone number is [phone].  Please show the [facility] at check-in to the Emergency Department and triage nurse.

## 2019-05-22 DIAGNOSIS — Z113 Encounter for screening for infections with a predominantly sexual mode of transmission: Secondary | ICD-10-CM | POA: Diagnosis not present

## 2019-05-23 ENCOUNTER — Inpatient Hospital Stay (HOSPITAL_BASED_OUTPATIENT_CLINIC_OR_DEPARTMENT_OTHER): Payer: BC Managed Care – PPO | Admitting: Internal Medicine

## 2019-05-23 ENCOUNTER — Inpatient Hospital Stay: Payer: BC Managed Care – PPO

## 2019-05-23 ENCOUNTER — Inpatient Hospital Stay: Payer: BC Managed Care – PPO | Attending: Internal Medicine

## 2019-05-23 ENCOUNTER — Other Ambulatory Visit: Payer: Self-pay

## 2019-05-23 ENCOUNTER — Encounter: Payer: Self-pay | Admitting: Internal Medicine

## 2019-05-23 VITALS — BP 116/74 | HR 70 | Temp 98.5°F | Resp 18 | Ht 69.0 in | Wt 149.7 lb

## 2019-05-23 DIAGNOSIS — Z5112 Encounter for antineoplastic immunotherapy: Secondary | ICD-10-CM | POA: Diagnosis not present

## 2019-05-23 DIAGNOSIS — Z79899 Other long term (current) drug therapy: Secondary | ICD-10-CM | POA: Insufficient documentation

## 2019-05-23 DIAGNOSIS — Z923 Personal history of irradiation: Secondary | ICD-10-CM

## 2019-05-23 DIAGNOSIS — C3432 Malignant neoplasm of lower lobe, left bronchus or lung: Secondary | ICD-10-CM

## 2019-05-23 DIAGNOSIS — C439 Malignant melanoma of skin, unspecified: Secondary | ICD-10-CM

## 2019-05-23 DIAGNOSIS — C3482 Malignant neoplasm of overlapping sites of left bronchus and lung: Secondary | ICD-10-CM | POA: Insufficient documentation

## 2019-05-23 DIAGNOSIS — C7931 Secondary malignant neoplasm of brain: Secondary | ICD-10-CM | POA: Insufficient documentation

## 2019-05-23 DIAGNOSIS — R5382 Chronic fatigue, unspecified: Secondary | ICD-10-CM

## 2019-05-23 DIAGNOSIS — C7802 Secondary malignant neoplasm of left lung: Secondary | ICD-10-CM

## 2019-05-23 DIAGNOSIS — K7689 Other specified diseases of liver: Secondary | ICD-10-CM | POA: Insufficient documentation

## 2019-05-23 LAB — CBC WITH DIFFERENTIAL (CANCER CENTER ONLY)
Abs Immature Granulocytes: 0.01 10*3/uL (ref 0.00–0.07)
Basophils Absolute: 0 10*3/uL (ref 0.0–0.1)
Basophils Relative: 1 %
Eosinophils Absolute: 0 10*3/uL (ref 0.0–0.5)
Eosinophils Relative: 1 %
HCT: 40.4 % (ref 36.0–46.0)
Hemoglobin: 13.7 g/dL (ref 12.0–15.0)
Immature Granulocytes: 0 %
Lymphocytes Relative: 18 %
Lymphs Abs: 0.7 10*3/uL (ref 0.7–4.0)
MCH: 30.8 pg (ref 26.0–34.0)
MCHC: 33.9 g/dL (ref 30.0–36.0)
MCV: 90.8 fL (ref 80.0–100.0)
Monocytes Absolute: 0.5 10*3/uL (ref 0.1–1.0)
Monocytes Relative: 15 %
Neutro Abs: 2.3 10*3/uL (ref 1.7–7.7)
Neutrophils Relative %: 65 %
Platelet Count: 223 10*3/uL (ref 150–400)
RBC: 4.45 MIL/uL (ref 3.87–5.11)
RDW: 12.7 % (ref 11.5–15.5)
WBC Count: 3.6 10*3/uL — ABNORMAL LOW (ref 4.0–10.5)
nRBC: 0 % (ref 0.0–0.2)

## 2019-05-23 LAB — CMP (CANCER CENTER ONLY)
ALT: 46 U/L — ABNORMAL HIGH (ref 0–44)
AST: 45 U/L — ABNORMAL HIGH (ref 15–41)
Albumin: 3.8 g/dL (ref 3.5–5.0)
Alkaline Phosphatase: 83 U/L (ref 38–126)
Anion gap: 8 (ref 5–15)
BUN: 9 mg/dL (ref 6–20)
CO2: 26 mmol/L (ref 22–32)
Calcium: 8.9 mg/dL (ref 8.9–10.3)
Chloride: 106 mmol/L (ref 98–111)
Creatinine: 0.88 mg/dL (ref 0.44–1.00)
GFR, Est AFR Am: >60 mL/min (ref >60)
GFR, Estimated: >60 mL/min (ref >60)
Glucose, Bld: 85 mg/dL (ref 70–99)
Potassium: 3.8 mmol/L (ref 3.5–5.1)
Sodium: 140 mmol/L (ref 135–145)
Total Bilirubin: 0.3 mg/dL (ref 0.3–1.2)
Total Protein: 6.4 g/dL — ABNORMAL LOW (ref 6.5–8.1)

## 2019-05-23 LAB — TSH: TSH: 2.373 u[IU]/mL (ref 0.308–3.960)

## 2019-05-23 MED ORDER — SODIUM CHLORIDE 0.9 % IV SOLN
240.0000 mg | Freq: Once | INTRAVENOUS | Status: AC
  Administered 2019-05-23: 240 mg via INTRAVENOUS
  Filled 2019-05-23: qty 24

## 2019-05-23 MED ORDER — SODIUM CHLORIDE 0.9 % IV SOLN
Freq: Once | INTRAVENOUS | Status: AC
  Administered 2019-05-23: 13:00:00 via INTRAVENOUS
  Filled 2019-05-23: qty 250

## 2019-05-23 NOTE — Progress Notes (Signed)
[facility] [phone]   [phone]  OFFICE PROGRESS NOTE  System, Pcp Not In No address on file  DIAGNOSIS: Metastatic high-grade neoplasm consistent with metastatic malignant melanoma based on the most recent pathology report from [facility] in October 2019, presented with large left left upper/left lower lobe mass with a hypermetabolic AP window lymph node as well as solitary metastatic brain lesion diagnosed in May 2019.  Biomarker Findings Microsatellite status - MS-Stable Tumor Mutational Burden - TMB-Intermediate (16 Muts/Mb) Genomic Findings For a complete list of the genes assayed, please refer to the Appendix. CD274 (PD-L1) amplification NRAS Q61R PDCD1LG2 (PD-L2) amplification MYC amplification EPHB1 amplification JAK2 amplification RB1 Q93*, T062* TERT promoter -146C>T TP53 C275W   PRIOR THERAPY: 1) left temporal craniotomy with resection of tumor with intraoperative stereotactic guidance for volumetric resection under the care of Dr. Annette Stable on 04/29/2018. 2) First-line treatment with immunotherapy with Keytruda 200 mg IV every 3 weeks.  First dose June 02, 2018.  Status post 3 cycles.  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 3) palliative radiotherapy to the left lung mass under the care of Dr. Lisbeth Renshaw completed on 08/24/2018. 4) Resuming her treatment again with Keytruda 200 mg IV every 3 weeks, first dose 09/08/2018.  Status post 2 cycles.  CURRENT THERAPY: Treatment with immunotherapy with ipilimumab 3 mg/KG and nivolumab 1 mg/KG every 3 weeks for the first 4 cycles followed by maintenance nivolumab 240 mg IV every 2 weeks.  First dose of this treatment October 26, 2018.  Status post 9 cycles.  Ipilimumab was discontinued after cycle #2 for the significant liver dysfunction.  Starting from cycle #3 the patient is on treatment with single agent nivolumab.  INTERVAL HISTORY:  Leslie [name] 43 y.o. female returns to the clinic today for follow-up visit.  The patient is feeling fine today with no concerning complaints.  Her boyfriend was diagnosed with genital herpes recently and has taking medication prescribed by his primary care physician.  She was seen by her gynecologist and she is feeling fine.  She denied having any chest pain, shortness of breath, cough or hemoptysis.  She denied having any fever or chills.  She has no nausea, vomiting, diarrhea or constipation.  She denied having any headache or visual changes.  She is here today for evaluation before starting cycle #10 of her treatment.  MEDICAL HISTORY: Past Medical History:  Diagnosis Date  . Migraines   . Yeast infection     ALLERGIES:  has No Known Allergies.  MEDICATIONS:  Current Outpatient Medications  Medication Sig Dispense Refill  . Ascorbic Acid (LIQUID C 500) 500 MG/15ML LIQD Take 1,000 mg by mouth daily.    . Cholecalciferol 1000 UNIT/10ML LIQD Take by mouth.    . Doxylamine Succinate, Sleep, (UNISOM PO) Take by mouth.    . fluconazole (DIFLUCAN) 100 MG tablet Take 1 tablet (100 mg total) by mouth daily. 10 tablet 0  . GAMMA AMINOBUTYRIC ACID PO Take by mouth.    Marland Kitchen HCA CALCIUM-MAGNESIUM-ZINC PO Take by mouth. Taking one half tablespoon daily    . Melatonin 5 MG CAPS Take by mouth.    . Multiple Vitamin (MULTIVITAMIN) tablet Take 1 tablet by mouth daily.    . Omega 3-6-9 Fatty Acids (OMEGA 3-6-9 COMPLEX PO) Take 1 capsule by mouth daily.    Marland Kitchen OVER THE COUNTER MEDICATION     . prochlorperazine (COMPAZINE) 10 MG tablet Take 1 tablet (10 mg total)  by mouth every 6 (six) hours as needed for nausea or vomiting. 30 tablet 0  . temazepam (RESTORIL) 15 MG capsule Take 1 capsule (15 mg total) by mouth at bedtime as needed for sleep. 20 capsule 0  . magic mouthwash w/lidocaine SOLN Take 5 mLs by mouth 4 (four) times daily as needed for mouth pain. (Patient not taking: Reported on 05/23/2019) 240  mL 1  . predniSONE (DELTASONE) 10 MG tablet Take 1 tablet by mouth as directed.     No current facility-administered medications for this visit.     SURGICAL HISTORY:  Past Surgical History:  Procedure Laterality Date  . APPLICATION OF CRANIAL NAVIGATION Left 04/29/2018   Procedure: APPLICATION OF CRANIAL NAVIGATION;  Surgeon: Earnie Larsson, MD;  Location: [facility];  Service: Neurosurgery;  Laterality: Left;  . CRANIOTOMY Left 04/29/2018   Procedure: LEFT CRANIOTOMY FOR  TUMOR BRAIN LAB;  Surgeon: Earnie Larsson, MD;  Location: [facility];  Service: Neurosurgery;  Laterality: Left;    REVIEW OF SYSTEMS:  A comprehensive review of systems was negative.   PHYSICAL EXAMINATION: General appearance: alert, cooperative and no distress Head: Normocephalic, without obvious abnormality, atraumatic Neck: no adenopathy, no JVD, supple, symmetrical, trachea midline and thyroid not enlarged, symmetric, no tenderness/mass/nodules Lymph nodes: Cervical, supraclavicular, and axillary nodes normal. Resp: clear to auscultation bilaterally Back: negative, symmetric, no curvature. ROM normal. No CVA tenderness. Cardio: regular rate and rhythm, S1, S2 normal, no murmur, click, rub or gallop GI: soft, non-tender; bowel sounds normal; no masses,  no organomegaly Extremities: extremities normal, atraumatic, no cyanosis or edema  ECOG PERFORMANCE STATUS: 0 - Asymptomatic  Blood pressure 116/74, pulse 70, temperature 98.5 F (36.9 C), temperature source Oral, resp. rate 18, height _0  (1.753 m), weight 149 lb 11.2 oz (67.9 kg), SpO2 100 %, unknown if currently breastfeeding.  LABORATORY DATA: Lab Results  Component Value Date   WBC 3.6 (L) 05/23/2019   HGB 13.7 05/23/2019   HCT 40.4 05/23/2019   MCV 90.8 05/23/2019   PLT 223 05/23/2019      Chemistry      Component Value Date/Time   NA 138 05/10/2019 0838   K 4.3 05/10/2019 0838   CL 105 05/10/2019 0838   CO2 26 05/10/2019 0838   BUN 13 05/10/2019 0838    CREATININE 0.93 05/10/2019 0838      Component Value Date/Time   CALCIUM 8.8 (L) 05/10/2019 0838   ALKPHOS 75 05/10/2019 0838   AST 49 (H) 05/10/2019 0838   ALT 54 (H) 05/10/2019 0838   BILITOT 0.3 05/10/2019 0838       RADIOGRAPHIC STUDIES: No results found.  ASSESSMENT AND PLAN: This is a very pleasant 43 years old white female with highly suspicious metastatic malignant melanoma presented with large mass in the left upper/left lower lobe and mediastinal lymphadenopathy as well as solitary brain metastasis status post left temporal craniotomy and resection of tumor on 04/29/2018 and she is recovering well from her surgery. The patient completed stereotactic radiotherapy to the resection cavity next week under the care of Dr. Lisbeth Renshaw. The patient underwent treatment with immunotherapy with Keytruda 200 mg IV every 3 weeks status post 3 cycles.   Her scan after cycle #3 showed enlargement of the left upper lobe lung mass.  This was also suspicious for pseudo-progression on immunotherapy.  The patient was started on a palliative course of radiotherapy to the left upper lobe lung mass and she tolerated this treatment fairly well.  She was seen by  Dr. Emelda Brothers at [facility] and she recommended for the patient to resume her treatment with [facility] for now until she undergoes further molecular studies.  The patient was treated with 2 more cycles of Keytruda and tolerated the treatment well. Her recent CT scan of the chest, abdomen and pelvis showed some improvement in the left upper lobe lung mass but there is still concern about pericardial invasion. I personally and independently reviewed the scan images and discussed the result and showed the images to the patient and her boyfriend today.  She is still not a good candidate for surgical resection because of the pericardial invasion. The final pathology report and recommendation from [facility] was consistent with metastatic  melanoma and the recommendation is to switch the patient to a combination immunotherapy with Ipilumumab and nivolumab. The patient is currently undergoing treatment with immunotherapy with ipilimumab and nivolumab status post 2 cycles.  Her treatment was on hold for more than 6 weeks secondary to grade 3 hepatic dysfunction.  She had improvement of her liver enzyme after a prolonged treatment with a steroid with a tapering schedule. She resumed her treatment again with single agent nivolumab.  She is status post 9 cycles of treatment. The patient continues to tolerate this treatment well. I recommended her to proceed with cycle #10 today as planned. She will come back for follow-up visit in 2 weeks for evaluation before the next cycle. She was advised to call immediately if she has any concerning symptoms in the interval. The patient voices understanding of current disease status and treatment options and is in agreement with the current care plan. All questions were answered. The patient knows to call the clinic with any problems, questions or concerns. We can certainly see the patient much sooner if necessary.  Disclaimer: This note was dictated with voice recognition software. Similar sounding words can inadvertently be transcribed and may not be corrected upon review.

## 2019-05-23 NOTE — Patient Instructions (Signed)
[facility] Discharge Instructions for Patients Receiving Chemotherapy  Today you received the following chemotherapy agents Opdivo  To help prevent nausea and vomiting after your treatment, we encourage you to take your nausea medication as directed   If you develop nausea and vomiting that is not controlled by your nausea medication, call the clinic.   BELOW ARE SYMPTOMS THAT SHOULD BE REPORTED IMMEDIATELY:  *FEVER GREATER THAN 100.5 F  *CHILLS WITH OR WITHOUT FEVER  NAUSEA AND VOMITING THAT IS NOT CONTROLLED WITH YOUR NAUSEA MEDICATION  *UNUSUAL SHORTNESS OF BREATH  *UNUSUAL BRUISING OR BLEEDING  TENDERNESS IN MOUTH AND THROAT WITH OR WITHOUT PRESENCE OF ULCERS  *URINARY PROBLEMS  *BOWEL PROBLEMS  UNUSUAL RASH Items with * indicate a potential emergency and should be followed up as soon as possible.  Feel free to call the clinic should you have any questions or concerns. The clinic phone number is [phone].  Please show the [place] at check-in to the Emergency Department and triage nurse.

## 2019-05-24 ENCOUNTER — Telehealth: Payer: Self-pay | Admitting: Internal Medicine

## 2019-05-24 NOTE — Telephone Encounter (Signed)
Scheduled appt per 6/09 los - pt to get an updated schedule next cycle .

## 2019-06-01 DIAGNOSIS — M2560 Stiffness of unspecified joint, not elsewhere classified: Secondary | ICD-10-CM | POA: Diagnosis not present

## 2019-06-01 DIAGNOSIS — M9901 Segmental and somatic dysfunction of cervical region: Secondary | ICD-10-CM | POA: Diagnosis not present

## 2019-06-01 DIAGNOSIS — M9903 Segmental and somatic dysfunction of lumbar region: Secondary | ICD-10-CM | POA: Diagnosis not present

## 2019-06-01 DIAGNOSIS — M62838 Other muscle spasm: Secondary | ICD-10-CM | POA: Diagnosis not present

## 2019-06-06 ENCOUNTER — Other Ambulatory Visit: Payer: Self-pay

## 2019-06-06 ENCOUNTER — Inpatient Hospital Stay: Payer: BC Managed Care – PPO

## 2019-06-06 ENCOUNTER — Inpatient Hospital Stay (HOSPITAL_BASED_OUTPATIENT_CLINIC_OR_DEPARTMENT_OTHER): Payer: BC Managed Care – PPO | Admitting: Internal Medicine

## 2019-06-06 ENCOUNTER — Encounter: Payer: Self-pay | Admitting: Internal Medicine

## 2019-06-06 VITALS — BP 119/76 | HR 73 | Temp 98.9°F | Resp 18 | Ht 69.0 in | Wt 149.4 lb

## 2019-06-06 DIAGNOSIS — C3482 Malignant neoplasm of overlapping sites of left bronchus and lung: Secondary | ICD-10-CM | POA: Diagnosis not present

## 2019-06-06 DIAGNOSIS — C3432 Malignant neoplasm of lower lobe, left bronchus or lung: Secondary | ICD-10-CM

## 2019-06-06 DIAGNOSIS — Z923 Personal history of irradiation: Secondary | ICD-10-CM | POA: Diagnosis not present

## 2019-06-06 DIAGNOSIS — C7802 Secondary malignant neoplasm of left lung: Secondary | ICD-10-CM

## 2019-06-06 DIAGNOSIS — Z5112 Encounter for antineoplastic immunotherapy: Secondary | ICD-10-CM

## 2019-06-06 DIAGNOSIS — K7689 Other specified diseases of liver: Secondary | ICD-10-CM

## 2019-06-06 DIAGNOSIS — C7931 Secondary malignant neoplasm of brain: Secondary | ICD-10-CM | POA: Diagnosis not present

## 2019-06-06 DIAGNOSIS — Z79899 Other long term (current) drug therapy: Secondary | ICD-10-CM

## 2019-06-06 LAB — CMP (CANCER CENTER ONLY)
ALT: 44 U/L (ref 0–44)
AST: 45 U/L — ABNORMAL HIGH (ref 15–41)
Albumin: 4.1 g/dL (ref 3.5–5.0)
Alkaline Phosphatase: 87 U/L (ref 38–126)
Anion gap: 9 (ref 5–15)
BUN: 12 mg/dL (ref 6–20)
CO2: 27 mmol/L (ref 22–32)
Calcium: 8.6 mg/dL — ABNORMAL LOW (ref 8.9–10.3)
Chloride: 104 mmol/L (ref 98–111)
Creatinine: 0.96 mg/dL (ref 0.44–1.00)
GFR, Est AFR Am: >60 mL/min (ref >60)
GFR, Estimated: >60 mL/min (ref >60)
Glucose, Bld: 85 mg/dL (ref 70–99)
Potassium: 3.8 mmol/L (ref 3.5–5.1)
Sodium: 140 mmol/L (ref 135–145)
Total Bilirubin: 0.4 mg/dL (ref 0.3–1.2)
Total Protein: 7 g/dL (ref 6.5–8.1)

## 2019-06-06 LAB — CBC WITH DIFFERENTIAL (CANCER CENTER ONLY)
Abs Immature Granulocytes: 0 10*3/uL (ref 0.00–0.07)
Basophils Absolute: 0 10*3/uL (ref 0.0–0.1)
Basophils Relative: 1 %
Eosinophils Absolute: 0.1 10*3/uL (ref 0.0–0.5)
Eosinophils Relative: 2 %
HCT: 41.4 % (ref 36.0–46.0)
Hemoglobin: 14.1 g/dL (ref 12.0–15.0)
Immature Granulocytes: 0 %
Lymphocytes Relative: 18 %
Lymphs Abs: 0.6 10*3/uL — ABNORMAL LOW (ref 0.7–4.0)
MCH: 30.9 pg (ref 26.0–34.0)
MCHC: 34.1 g/dL (ref 30.0–36.0)
MCV: 90.6 fL (ref 80.0–100.0)
Monocytes Absolute: 0.5 10*3/uL (ref 0.1–1.0)
Monocytes Relative: 16 %
Neutro Abs: 2.2 10*3/uL (ref 1.7–7.7)
Neutrophils Relative %: 63 %
Platelet Count: 259 10*3/uL (ref 150–400)
RBC: 4.57 MIL/uL (ref 3.87–5.11)
RDW: 12.5 % (ref 11.5–15.5)
WBC Count: 3.4 10*3/uL — ABNORMAL LOW (ref 4.0–10.5)
nRBC: 0 % (ref 0.0–0.2)

## 2019-06-06 MED ORDER — SODIUM CHLORIDE 0.9 % IV SOLN
240.0000 mg | Freq: Once | INTRAVENOUS | Status: AC
  Administered 2019-06-06: 240 mg via INTRAVENOUS
  Filled 2019-06-06: qty 24

## 2019-06-06 MED ORDER — SODIUM CHLORIDE 0.9 % IV SOLN
Freq: Once | INTRAVENOUS | Status: AC
  Administered 2019-06-06: 12:00:00 via INTRAVENOUS
  Filled 2019-06-06: qty 250

## 2019-06-06 NOTE — Progress Notes (Signed)
[place] [phone]   [phone]  OFFICE PROGRESS NOTE  System, Pcp Not In No address on file  DIAGNOSIS: Metastatic high-grade neoplasm consistent with metastatic malignant melanoma based on the most recent pathology report from [facility] in October 2019, presented with large left left upper/left lower lobe mass with a hypermetabolic AP window lymph node as well as solitary metastatic brain lesion diagnosed in May 2019.  Biomarker Findings Microsatellite status - MS-Stable Tumor Mutational Burden - TMB-Intermediate (16 Muts/Mb) Genomic Findings For a complete list of the genes assayed, please refer to the Appendix. CD274 (PD-L1) amplification NRAS Q61R PDCD1LG2 (PD-L2) amplification MYC amplification EPHB1 amplification JAK2 amplification RB1 Q93*, K562* TERT promoter -146C>T TP53 C275W   PRIOR THERAPY: 1) left temporal craniotomy with resection of tumor with intraoperative stereotactic guidance for volumetric resection under the care of Dr. Annette Stable on 04/29/2018. 2) First-line treatment with immunotherapy with Keytruda 200 mg IV every 3 weeks.  First dose June 02, 2018.  Status post 3 cycles.  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 3) palliative radiotherapy to the left lung mass under the care of Dr. Lisbeth Renshaw completed on 08/24/2018. 4) Resuming her treatment again with Keytruda 200 mg IV every 3 weeks, first dose 09/08/2018.  Status post 2 cycles.  CURRENT THERAPY: Treatment with immunotherapy with ipilimumab 3 mg/KG and nivolumab 1 mg/KG every 3 weeks for the first 4 cycles followed by maintenance nivolumab 240 mg IV every 2 weeks.  First dose of this treatment October 26, 2018.  Status post 10 cycles.  Ipilimumab was discontinued after cycle #2 for the significant liver dysfunction.  Starting from cycle #3 the patient is on treatment with single agent nivolumab.  INTERVAL HISTORY:  Leslie [name] 43 y.o. female returns to the clinic today for follow-up visit.  The patient is feeling fine today with no concerning complaints except for one episode of dull pain on the left side of the chest that resolved spontaneously.  She denied having any associated shortness of breath, cough or hemoptysis.  She denied having any fever or chills.  She has no nausea, vomiting, diarrhea or constipation.  She denied having any headache or visual changes.  She continues to tolerate her treatment with nivolumab fairly well.  The patient is here today for evaluation before starting cycle #11.  MEDICAL HISTORY: Past Medical History:  Diagnosis Date  . Migraines   . Yeast infection     ALLERGIES:  has No Known Allergies.  MEDICATIONS:  Current Outpatient Medications  Medication Sig Dispense Refill  . Ascorbic Acid (LIQUID C 500) 500 MG/15ML LIQD Take 1,000 mg by mouth daily.    . Cholecalciferol 1000 UNIT/10ML LIQD Take by mouth.    . Doxylamine Succinate, Sleep, (UNISOM PO) Take by mouth.    . fluconazole (DIFLUCAN) 100 MG tablet Take 1 tablet (100 mg total) by mouth daily. 10 tablet 0  . GAMMA AMINOBUTYRIC ACID PO Take by mouth.    Marland Kitchen HCA CALCIUM-MAGNESIUM-ZINC PO Take by mouth. Taking one half tablespoon daily    . magic mouthwash w/lidocaine SOLN Take 5 mLs by mouth 4 (four) times daily as needed for mouth pain. (Patient not taking: Reported on 05/23/2019) 240 mL 1  . Melatonin 5 MG CAPS Take by mouth.    . Multiple Vitamin (MULTIVITAMIN) tablet Take 1 tablet by mouth daily.    . Omega 3-6-9 Fatty Acids (OMEGA 3-6-9 COMPLEX PO) Take 1 capsule by mouth daily.    Marland Kitchen  OVER THE COUNTER MEDICATION     . predniSONE (DELTASONE) 10 MG tablet Take 1 tablet by mouth as directed.    . prochlorperazine (COMPAZINE) 10 MG tablet Take 1 tablet (10 mg total) by mouth every 6 (six) hours as needed for nausea or vomiting. 30 tablet 0  . temazepam (RESTORIL) 15 MG capsule Take 1 capsule (15 mg total) by  mouth at bedtime as needed for sleep. 20 capsule 0   No current facility-administered medications for this visit.     SURGICAL HISTORY:  Past Surgical History:  Procedure Laterality Date  . APPLICATION OF CRANIAL NAVIGATION Left 04/29/2018   Procedure: APPLICATION OF CRANIAL NAVIGATION;  Surgeon: Earnie Larsson, MD;  Location: [place];  Service: Neurosurgery;  Laterality: Left;  . CRANIOTOMY Left 04/29/2018   Procedure: LEFT CRANIOTOMY FOR  TUMOR BRAIN LAB;  Surgeon: Earnie Larsson, MD;  Location: [place];  Service: Neurosurgery;  Laterality: Left;    REVIEW OF SYSTEMS:  A comprehensive review of systems was negative.   PHYSICAL EXAMINATION: General appearance: alert, cooperative and no distress Head: Normocephalic, without obvious abnormality, atraumatic Neck: no adenopathy, no JVD, supple, symmetrical, trachea midline and thyroid not enlarged, symmetric, no tenderness/mass/nodules Lymph nodes: Cervical, supraclavicular, and axillary nodes normal. Resp: clear to auscultation bilaterally Back: negative, symmetric, no curvature. ROM normal. No CVA tenderness. Cardio: regular rate and rhythm, S1, S2 normal, no murmur, click, rub or gallop GI: soft, non-tender; bowel sounds normal; no masses,  no organomegaly Extremities: extremities normal, atraumatic, no cyanosis or edema  ECOG PERFORMANCE STATUS: 0 - Asymptomatic  Blood pressure 119/76, pulse 73, temperature 98.9 F (37.2 C), temperature source Oral, resp. rate 18, height _0  (1.753 m), weight 149 lb 6.4 oz (67.8 kg), SpO2 100 %, unknown if currently breastfeeding.  LABORATORY DATA: Lab Results  Component Value Date   WBC 3.4 (L) 06/06/2019   HGB 14.1 06/06/2019   HCT 41.4 06/06/2019   MCV 90.6 06/06/2019   PLT 259 06/06/2019      Chemistry      Component Value Date/Time   NA 140 05/23/2019 1141   K 3.8 05/23/2019 1141   CL 106 05/23/2019 1141   CO2 26 05/23/2019 1141   BUN 9 05/23/2019 1141   CREATININE 0.88 05/23/2019 1141       Component Value Date/Time   CALCIUM 8.9 05/23/2019 1141   ALKPHOS 83 05/23/2019 1141   AST 45 (H) 05/23/2019 1141   ALT 46 (H) 05/23/2019 1141   BILITOT 0.3 05/23/2019 1141       RADIOGRAPHIC STUDIES: No results found.  ASSESSMENT AND PLAN: This is a very pleasant 43 years old white female with highly suspicious metastatic malignant melanoma presented with large mass in the left upper/left lower lobe and mediastinal lymphadenopathy as well as solitary brain metastasis status post left temporal craniotomy and resection of tumor on 04/29/2018 and she is recovering well from her surgery. The patient completed stereotactic radiotherapy to the resection cavity next week under the care of Dr. Lisbeth Renshaw. The patient underwent treatment with immunotherapy with Keytruda 200 mg IV every 3 weeks status post 3 cycles.   Her scan after cycle #3 showed enlargement of the left upper lobe lung mass.  This was also suspicious for pseudo-progression on immunotherapy.  The patient was started on a palliative course of radiotherapy to the left upper lobe lung mass and she tolerated this treatment fairly well.  She was seen by Dr. Emelda Brothers at [facility] and she recommended for the  patient to resume her treatment with [facility] for now until she undergoes further molecular studies.  The patient was treated with 2 more cycles of Keytruda and tolerated the treatment well. Her recent CT scan of the chest, abdomen and pelvis showed some improvement in the left upper lobe lung mass but there is still concern about pericardial invasion. I personally and independently reviewed the scan images and discussed the result and showed the images to the patient and her boyfriend today.  She is still not a good candidate for surgical resection because of the pericardial invasion. The final pathology report and recommendation from [facility] was consistent with metastatic melanoma and the recommendation is to  switch the patient to a combination immunotherapy with Ipilumumab and nivolumab. The patient is currently undergoing treatment with immunotherapy with ipilimumab and nivolumab status post 2 cycles.  Her treatment was on hold for more than 6 weeks secondary to grade 3 hepatic dysfunction.  She had improvement of her liver enzyme after a prolonged treatment with a steroid with a tapering schedule. She resumed her treatment again with single agent nivolumab.  She is status post 10 cycles of treatment. She continues to tolerate this treatment well with no concerning adverse effects. I recommended for her to proceed with cycle #11 today as planned.  She will come back in 2 weeks for cycle #12. I will see her back for follow-up visit in 4 weeks for evaluation before starting cycle #13 with repeat CT scan of the chest, abdomen and pelvis for restaging of her disease. The patient was advised to call immediately if she has any concerning symptoms in the interval and especially if she has any recurrent episodes of the chest pain. The patient voices understanding of current disease status and treatment options and is in agreement with the current care plan. All questions were answered. The patient knows to call the clinic with any problems, questions or concerns. We can certainly see the patient much sooner if necessary.  Disclaimer: This note was dictated with voice recognition software. Similar sounding words can inadvertently be transcribed and may not be corrected upon review.

## 2019-06-06 NOTE — Patient Instructions (Signed)
[facility] Discharge Instructions for Patients Receiving Chemotherapy  Today you received the following chemotherapy agent: Opdivo  To help prevent nausea and vomiting after your treatment, we encourage you to take your nausea medication as directed   If you develop nausea and vomiting that is not controlled by your nausea medication, call the clinic.   BELOW ARE SYMPTOMS THAT SHOULD BE REPORTED IMMEDIATELY:  *FEVER GREATER THAN 100.5 F  *CHILLS WITH OR WITHOUT FEVER  NAUSEA AND VOMITING THAT IS NOT CONTROLLED WITH YOUR NAUSEA MEDICATION  *UNUSUAL SHORTNESS OF BREATH  *UNUSUAL BRUISING OR BLEEDING  TENDERNESS IN MOUTH AND THROAT WITH OR WITHOUT PRESENCE OF ULCERS  *URINARY PROBLEMS  *BOWEL PROBLEMS  UNUSUAL RASH Items with * indicate a potential emergency and should be followed up as soon as possible.  Feel free to call the clinic should you have any questions or concerns. The clinic phone number is [phone].  Please show the [place] [facility] at check-in to the Emergency Department and triage nurse.

## 2019-06-08 ENCOUNTER — Telehealth: Payer: Self-pay | Admitting: Medical Oncology

## 2019-06-08 NOTE — Telephone Encounter (Signed)
Chest pressure -second episode today ( one yesterday ) . Per [facility] CT scan date moved up to tomorrow Schedulingnotified.

## 2019-06-09 ENCOUNTER — Encounter (HOSPITAL_COMMUNITY): Payer: Self-pay

## 2019-06-09 ENCOUNTER — Telehealth: Payer: Self-pay | Admitting: Medical Oncology

## 2019-06-09 ENCOUNTER — Ambulatory Visit (HOSPITAL_COMMUNITY)
Admission: RE | Admit: 2019-06-09 | Discharge: 2019-06-09 | Disposition: A | Discharge status: Home / Self Care | Payer: BC Managed Care – PPO | Source: Ambulatory Visit | Attending: Internal Medicine | Admitting: Internal Medicine

## 2019-06-09 ENCOUNTER — Other Ambulatory Visit: Payer: Self-pay

## 2019-06-09 DIAGNOSIS — C3432 Malignant neoplasm of lower lobe, left bronchus or lung: Secondary | ICD-10-CM

## 2019-06-09 DIAGNOSIS — C4359 Malignant melanoma of other part of trunk: Secondary | ICD-10-CM | POA: Diagnosis not present

## 2019-06-09 DIAGNOSIS — C7931 Secondary malignant neoplasm of brain: Secondary | ICD-10-CM | POA: Diagnosis not present

## 2019-06-09 HISTORY — DX: Malignant (primary) neoplasm, unspecified: C80.1

## 2019-06-09 MED ORDER — IOHEXOL 300 MG/ML  SOLN
100.0000 mL | Freq: Once | INTRAMUSCULAR | Status: AC | PRN
  Administered 2019-06-09: 100 mL via INTRAVENOUS

## 2019-06-09 MED ORDER — SODIUM CHLORIDE (PF) 0.9 % IJ SOLN
INTRAMUSCULAR | Status: AC
  Filled 2019-06-09: qty 50

## 2019-06-09 NOTE — Telephone Encounter (Signed)
Per Dr Julien Nordmann , I told pt her scan was fine and read the impression to her.  She has not had anymore episodes and worked out today with her son jump roping. I instructed Dia to monitor symptom for now and document episodes and any  associated symptoms and to call anytime if they recur.

## 2019-06-15 DIAGNOSIS — Z85820 Personal history of malignant melanoma of skin: Secondary | ICD-10-CM | POA: Diagnosis not present

## 2019-06-15 DIAGNOSIS — Z124 Encounter for screening for malignant neoplasm of cervix: Secondary | ICD-10-CM | POA: Diagnosis not present

## 2019-06-15 DIAGNOSIS — Z01419 Encounter for gynecological examination (general) (routine) without abnormal findings: Secondary | ICD-10-CM | POA: Diagnosis not present

## 2019-06-15 DIAGNOSIS — Z1231 Encounter for screening mammogram for malignant neoplasm of breast: Secondary | ICD-10-CM | POA: Diagnosis not present

## 2019-06-15 DIAGNOSIS — Z6822 Body mass index (BMI) 22.0-22.9, adult: Secondary | ICD-10-CM | POA: Diagnosis not present

## 2019-06-19 ENCOUNTER — Telehealth: Payer: Self-pay | Admitting: Medical Oncology

## 2019-06-19 ENCOUNTER — Inpatient Hospital Stay: Payer: BC Managed Care – PPO | Attending: Internal Medicine

## 2019-06-19 ENCOUNTER — Other Ambulatory Visit: Payer: Self-pay

## 2019-06-19 ENCOUNTER — Inpatient Hospital Stay: Payer: BC Managed Care – PPO

## 2019-06-19 ENCOUNTER — Ambulatory Visit: Payer: BLUE CROSS/BLUE SHIELD | Admitting: Internal Medicine

## 2019-06-19 VITALS — BP 120/85 | HR 67 | Temp 98.5°F | Resp 18

## 2019-06-19 DIAGNOSIS — Z5112 Encounter for antineoplastic immunotherapy: Secondary | ICD-10-CM | POA: Diagnosis not present

## 2019-06-19 DIAGNOSIS — C7802 Secondary malignant neoplasm of left lung: Secondary | ICD-10-CM

## 2019-06-19 DIAGNOSIS — K7689 Other specified diseases of liver: Secondary | ICD-10-CM | POA: Diagnosis not present

## 2019-06-19 DIAGNOSIS — Z79899 Other long term (current) drug therapy: Secondary | ICD-10-CM | POA: Diagnosis not present

## 2019-06-19 DIAGNOSIS — C439 Malignant melanoma of skin, unspecified: Secondary | ICD-10-CM | POA: Insufficient documentation

## 2019-06-19 DIAGNOSIS — C7931 Secondary malignant neoplasm of brain: Secondary | ICD-10-CM | POA: Insufficient documentation

## 2019-06-19 LAB — CBC WITH DIFFERENTIAL (CANCER CENTER ONLY)
Abs Immature Granulocytes: 0.01 10*3/uL (ref 0.00–0.07)
Basophils Absolute: 0 10*3/uL (ref 0.0–0.1)
Basophils Relative: 1 %
Eosinophils Absolute: 0.1 10*3/uL (ref 0.0–0.5)
Eosinophils Relative: 2 %
HCT: 40.5 % (ref 36.0–46.0)
Hemoglobin: 13.5 g/dL (ref 12.0–15.0)
Immature Granulocytes: 0 %
Lymphocytes Relative: 17 %
Lymphs Abs: 0.6 10*3/uL — ABNORMAL LOW (ref 0.7–4.0)
MCH: 30.2 pg (ref 26.0–34.0)
MCHC: 33.3 g/dL (ref 30.0–36.0)
MCV: 90.6 fL (ref 80.0–100.0)
Monocytes Absolute: 0.6 10*3/uL (ref 0.1–1.0)
Monocytes Relative: 16 %
Neutro Abs: 2.3 10*3/uL (ref 1.7–7.7)
Neutrophils Relative %: 64 %
Platelet Count: 263 10*3/uL (ref 150–400)
RBC: 4.47 MIL/uL (ref 3.87–5.11)
RDW: 12.5 % (ref 11.5–15.5)
WBC Count: 3.6 10*3/uL — ABNORMAL LOW (ref 4.0–10.5)
nRBC: 0 % (ref 0.0–0.2)

## 2019-06-19 LAB — CMP (CANCER CENTER ONLY)
ALT: 39 U/L (ref 0–44)
AST: 39 U/L (ref 15–41)
Albumin: 3.6 g/dL (ref 3.5–5.0)
Alkaline Phosphatase: 80 U/L (ref 38–126)
Anion gap: 9 (ref 5–15)
BUN: 9 mg/dL (ref 6–20)
CO2: 25 mmol/L (ref 22–32)
Calcium: 8.6 mg/dL — ABNORMAL LOW (ref 8.9–10.3)
Chloride: 106 mmol/L (ref 98–111)
Creatinine: 0.82 mg/dL (ref 0.44–1.00)
GFR, Est AFR Am: >60 mL/min (ref >60)
GFR, Estimated: >60 mL/min (ref >60)
Glucose, Bld: 76 mg/dL (ref 70–99)
Potassium: 4.1 mmol/L (ref 3.5–5.1)
Sodium: 140 mmol/L (ref 135–145)
Total Bilirubin: 0.2 mg/dL — ABNORMAL LOW (ref 0.3–1.2)
Total Protein: 6.4 g/dL — ABNORMAL LOW (ref 6.5–8.1)

## 2019-06-19 MED ORDER — SODIUM CHLORIDE 0.9 % IV SOLN
Freq: Once | INTRAVENOUS | Status: AC
  Administered 2019-06-19: 10:00:00 via INTRAVENOUS
  Filled 2019-06-19: qty 250

## 2019-06-19 MED ORDER — SODIUM CHLORIDE 0.9 % IV SOLN
240.0000 mg | Freq: Once | INTRAVENOUS | Status: AC
  Administered 2019-06-19: 240 mg via INTRAVENOUS
  Filled 2019-06-19: qty 24

## 2019-06-19 NOTE — Telephone Encounter (Signed)
?   Drug reaction-Stopped diflucan due to wheezing and throat pressure.  CT results - does Leslie [name] want to go over results with me? Is surgery an option?

## 2019-06-19 NOTE — Telephone Encounter (Signed)
Per Dr Julien Nordmann I informed pt that she is not a surgical candidate and to call us if she thinks she is developing thrush.

## 2019-06-19 NOTE — Patient Instructions (Signed)
[facility] Discharge Instructions for Patients Receiving Chemotherapy  Today you received the following chemotherapy agent: Opdivo  To help prevent nausea and vomiting after your treatment, we encourage you to take your nausea medication as directed   If you develop nausea and vomiting that is not controlled by your nausea medication, call the clinic.   BELOW ARE SYMPTOMS THAT SHOULD BE REPORTED IMMEDIATELY:  *FEVER GREATER THAN 100.5 F  *CHILLS WITH OR WITHOUT FEVER  NAUSEA AND VOMITING THAT IS NOT CONTROLLED WITH YOUR NAUSEA MEDICATION  *UNUSUAL SHORTNESS OF BREATH  *UNUSUAL BRUISING OR BLEEDING  TENDERNESS IN MOUTH AND THROAT WITH OR WITHOUT PRESENCE OF ULCERS  *URINARY PROBLEMS  *BOWEL PROBLEMS  UNUSUAL RASH Items with * indicate a potential emergency and should be followed up as soon as possible.  Feel free to call the clinic should you have any questions or concerns. The clinic phone number is [phone].  Please show the [place] at check-in to the Emergency Department and triage nurse.

## 2019-06-21 ENCOUNTER — Other Ambulatory Visit: Payer: Self-pay | Admitting: Radiation Therapy

## 2019-06-22 ENCOUNTER — Other Ambulatory Visit: Payer: Self-pay

## 2019-06-22 ENCOUNTER — Ambulatory Visit
Admission: RE | Admit: 2019-06-22 | Discharge: 2019-06-22 | Disposition: A | Discharge status: Home / Self Care | Payer: BLUE CROSS/BLUE SHIELD | Source: Ambulatory Visit | Attending: Radiation Oncology | Admitting: Radiation Oncology

## 2019-06-22 DIAGNOSIS — C7949 Secondary malignant neoplasm of other parts of nervous system: Secondary | ICD-10-CM

## 2019-06-22 DIAGNOSIS — C7931 Secondary malignant neoplasm of brain: Secondary | ICD-10-CM

## 2019-06-22 DIAGNOSIS — C439 Malignant melanoma of skin, unspecified: Secondary | ICD-10-CM | POA: Diagnosis not present

## 2019-06-22 MED ORDER — GADOBENATE DIMEGLUMINE 529 MG/ML IV SOLN
14.0000 mL | Freq: Once | INTRAVENOUS | Status: AC | PRN
  Administered 2019-06-22: 14 mL via INTRAVENOUS

## 2019-06-23 DIAGNOSIS — B977 Papillomavirus as the cause of diseases classified elsewhere: Secondary | ICD-10-CM | POA: Insufficient documentation

## 2019-06-26 ENCOUNTER — Ambulatory Visit
Admission: RE | Admit: 2019-06-26 | Discharge: 2019-06-26 | Disposition: A | Discharge status: Home / Self Care | Payer: BC Managed Care – PPO | Source: Ambulatory Visit | Attending: Radiation Oncology | Admitting: Radiation Oncology

## 2019-06-26 DIAGNOSIS — C3412 Malignant neoplasm of upper lobe, left bronchus or lung: Secondary | ICD-10-CM | POA: Diagnosis not present

## 2019-06-26 DIAGNOSIS — C439 Malignant melanoma of skin, unspecified: Secondary | ICD-10-CM

## 2019-06-26 DIAGNOSIS — C7931 Secondary malignant neoplasm of brain: Secondary | ICD-10-CM | POA: Diagnosis not present

## 2019-06-26 DIAGNOSIS — Z08 Encounter for follow-up examination after completed treatment for malignant neoplasm: Secondary | ICD-10-CM | POA: Diagnosis not present

## 2019-06-27 NOTE — Progress Notes (Signed)
Radiation Oncology         [phone] ________________________________  Outpatient Follow Up - Conducted via telephone due to current COVID-19 concerns for limiting patient exposure  I spoke with the patient to conduct this consult visit via telephone to spare the patient unnecessary potential exposure in the healthcare setting during the current COVID-19 pandemic. The patient was notified in advance and was offered a [place] meeting to allow for face to face communication but unfortunately reported that they did not have the appropriate resources/technology to support such a visit and instead preferred to proceed with a telephone visit.   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 In summary this is a pleasant 43 y.o. woman who initially presented in the summer of 2019 with a solitary brain tumor who underwent surgical resection leading to a diagnosis of high grade carcinoma. She received postoperative SRS to the site. She was found to have a large lung tumor and we initially considered palliative radiotherapy versus surgery, but the decision was to proceed with systemic therapy with Keytruda as her path was reviewed by several facilities and felt to be most consistent with melanoma. She received 3 cycles of treatment and repeat imaging on 08/01/18 revealed progression of her left lung mass to measure 8 x 5.6 cm (previously 5.6  x 3.9 cm). She did not have any additional areas of progression. She went on to receive palliative radiotherapy to the left lung mass in late summer 2019   She was seen at [facility] by Dr. Emelda Brothers, and who recommended doublet immunotherapy. She has continued with single agent opdivo since February 2020. She had recent CT imaging after some chest tightness on 06/09/2019 that showed improvement in her left chest mass measuring 4.7 x 3.1 cm, compared to 5.5 x 4 cm previously. Her recent brain MRI on 06/22/2019 revealed stable and satisfactory post treatment appearance of the brian without enhancing abnormalities or new lesions. She is contacted by phone to discuss these results.   On review of systems, the patient reports that she is doing well overall. 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 She's very excited about the prospects of this program. She reports she feels well with the exception of episodes of thrush following her immunotherapy. She denies any headaches, visual disturbances, unintended movements, confusion, or gait disturbances. She denies any chest pain, shortness of breath, cough, fevers, chills, night sweats, unintended weight changes. She denies any bowel or bladder disturbances, and denies abdominal pain, nausea or vomiting. She denies any new musculoskeletal or joint aches or pains, new skin lesions or concerns. A complete review of systems is obtained and is otherwise negative.    Past Medical History:  Past Medical History:  Diagnosis Date   met melanoma to brain  and lung dx'd 04/2018   unknown origin   Migraines    Yeast infection     Past Surgical History: Past Surgical History:  Procedure Laterality Date   APPLICATION OF CRANIAL NAVIGATION Left 04/29/2018   Procedure: APPLICATION OF CRANIAL NAVIGATION;   Surgeon: Earnie Larsson, MD;  Location: [place];  Service: Neurosurgery;  Laterality: Left;   CRANIOTOMY Left 04/29/2018   Procedure: LEFT CRANIOTOMY FOR  TUMOR BRAIN LAB;  Surgeon: Earnie Larsson, MD;  Location: [place];  Service: Neurosurgery;  Laterality: Left;    Social History:  Social History   Socioeconomic History   Marital status: Divorced    Spouse name: Not on file   Number of children: Not on file   Years of education: Not on file   Highest education level: Not on file  Occupational History   Not on file  Social Needs   Financial resource strain: Not on file   Food insecurity    Worry: Not on file    Inability: Not on file   Transportation needs    Medical: Not on file    Non-medical: Not on file  Tobacco Use   Smoking status: Never Smoker   Smokeless tobacco: Never Used  Substance and Sexual Activity   Alcohol use: Yes   Drug use: No   Sexual activity: Yes    Birth control/protection: Condom  Lifestyle   Physical activity    Days per week: Not on file    Minutes per session: Not on file   Stress: Not on file  Relationships   Social connections    Talks on phone: Not on file    Gets together: Not on file    Attends religious service: Not on file    Active member of club or organization: Not on file    Attends meetings of clubs or organizations: Not on file    Relationship status: Not on file   Intimate partner violence    Fear of current or ex partner: No    Emotionally abused: No    Physically abused: No    Forced sexual activity: No  Other Topics Concern   Not on file  Social History Narrative   Not on file  The patient is divorced but in a relationship. She has teenage children. 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 She is very physically active regularly.  Family History: Family History  Problem Relation Age of Onset   Cancer Father        lung    ALLERGIES:  is allergic to diflucan  [fluconazole].  Meds: Current Outpatient Medications  Medication Sig Dispense Refill   Ascorbic Acid (LIQUID C 500) 500 MG/15ML LIQD Take 1,000 mg by mouth daily.     Cholecalciferol 1000 UNIT/10ML LIQD Take by mouth.     Doxylamine Succinate, Sleep, (UNISOM PO) Take by mouth.     GAMMA AMINOBUTYRIC ACID PO Take by mouth.     HCA CALCIUM-MAGNESIUM-ZINC PO Take by mouth. Taking one half tablespoon daily     magic mouthwash w/lidocaine SOLN Take 5 mLs by mouth 4 (four) times daily as needed for mouth pain. (Patient not taking: Reported on 05/23/2019) 240 mL 1   Melatonin 5 MG CAPS Take by mouth.     Multiple Vitamin (MULTIVITAMIN) tablet Take 1 tablet by mouth daily.     Omega 3-6-9 Fatty Acids (OMEGA 3-6-9 COMPLEX PO) Take 1 capsule by mouth daily.     OVER  THE COUNTER MEDICATION      prochlorperazine (COMPAZINE) 10 MG tablet Take 1 tablet (10 mg total) by mouth every 6 (six) hours as needed for nausea or vomiting. 30 tablet 0   temazepam (RESTORIL) 15 MG capsule Take 1 capsule (15 mg total) by mouth at bedtime as needed for sleep. 20 capsule 0   No current facility-administered medications for this encounter.     Physical Findings: Unable to assess due to encounter type.  Lab Findings: Lab Results  Component Value Date   WBC 3.6 (L) 06/19/2019   HGB 13.5 06/19/2019   HCT 40.5 06/19/2019   MCV 90.6 06/19/2019   PLT 263 06/19/2019     Radiographic Findings: Ct Chest W Contrast  Result Date: 06/09/2019 CLINICAL DATA:  Metastatic melanoma with lung and brain metastases. EXAM: CT CHEST, ABDOMEN, AND PELVIS WITH CONTRAST TECHNIQUE: Multidetector CT imaging of the chest, abdomen and pelvis was performed following the standard protocol during bolus administration of intravenous contrast. CONTRAST:  122m OMNIPAQUE IOHEXOL 300 MG/ML  SOLN COMPARISON:  04/20/2019 FINDINGS: CT CHEST FINDINGS Cardiovascular: The heart size is normal. No substantial pericardial effusion.  Mediastinum/Nodes: No mediastinal lymphadenopathy. There is no hilar lymphadenopathy. The esophagus has normal imaging features. There is no axillary lymphadenopathy. Lungs/Pleura: The left pericardial lesion measures smaller today at 4.7 x 3.1 compared to 5.5 x 4.0 cm previously. Adjacent atelectasis/scarring is similar. No new suspicious pulmonary nodule or mass. No pleural effusion. Musculoskeletal: No worrisome lytic or sclerotic osseous abnormality. CT ABDOMEN PELVIS FINDINGS Hepatobiliary: No suspicious focal abnormality within the liver parenchyma. There is no evidence for gallstones, gallbladder wall thickening, or pericholecystic fluid. No intrahepatic or extrahepatic biliary dilation. Pancreas: No focal mass lesion. No dilatation of the main duct. No intraparenchymal cyst. No peripancreatic edema. Spleen: No splenomegaly. No focal mass lesion. Adrenals/Urinary Tract: No adrenal nodule or mass. Kidneys unremarkable. No evidence for hydroureter. The urinary bladder appears normal for the degree of distention. Stomach/Bowel: Stomach is unremarkable. No gastric wall thickening. No evidence of outlet obstruction. Duodenum is normally positioned as is the ligament of Treitz. No small bowel wall thickening. No small bowel dilatation. No gross colonic mass. No colonic wall thickening. Vascular/Lymphatic: No abdominal aortic aneurysm. No abdominal aortic atherosclerotic calcification. There is no gastrohepatic or hepatoduodenal ligament lymphadenopathy. No intraperitoneal or retroperitoneal lymphadenopathy. No pelvic sidewall lymphadenopathy. Reproductive: The uterus is unremarkable.  There is no adnexal mass. Other: Small volume free fluid noted in the cul-de-sac. Musculoskeletal: Scattered tiny sclerotic foci in the bony pelvis are stable. No new suspicious lytic or sclerotic osseous abnormality. IMPRESSION: 1. Continued further decrease in size of the low-density lesion in the left hemithorax, now measuring 4.7  x 3.1 compared to 5.5 x 4.0 cm previously. 2. No new or progressive findings in the chest, abdomen, or pelvis on today's study. Electronically Signed   By: EMisty StanleyM.D.   On: 06/09/2019 09:02   Mr BJeri CosWOZContrast  Result Date: 06/22/2019 CLINICAL DATA:  43year old female with metastatic melanoma. Left posterior temporal metastasis resected with postop SRS in June 2019. EXAM: MRI HEAD WITHOUT AND WITH CONTRAST TECHNIQUE: Multiplanar, multiecho pulse sequences of the brain and surrounding structures were obtained without and with intravenous contrast. CONTRAST:  149mMULTIHANCE GADOBENATE DIMEGLUMINE 529 MG/ML IV SOLN COMPARISON:  Brain MRI 04/13/2019 and earlier. FINDINGS: BRAIN New Lesions: None. Larger lesions: None. Stable or Smaller lesions: Stable post treatment appearance of the anterior left temporal lobe with a small inferior temporal resection cavity and  regional T2/FLAIR hyperintensity. No progressive signal changes and no suspicious enhancement following contrast. Other Brain findings: No restricted diffusion to suggest acute infarction. No midline shift, mass effect, evidence of mass lesion, ventriculomegaly, extra-axial collection or acute intracranial hemorrhage. Cervicomedullary junction and pituitary are within normal limits. Susceptibility associated with the left frontotemporal craniotomy and resection. No other chronic blood products. Small scattered nonspecific cerebral white matter T2 and FLAIR hyperintense foci are stable and slightly more numerous in the left hemisphere. Vascular: Major intracranial vascular flow voids are preserved. The major dural venous sinuses are enhancing and appear patent. Skull and upper cervical spine: Negative visible cervical spine and spinal cord. Visible bone marrow signal is stable and within normal limits. Sinuses/Orbits: Stable and negative. Other: Mastoids remain clear. Visible internal auditory structures appear normal. Stable scalp soft tissues.  IMPRESSION: 1. Stable and satisfactory post treatment appearance of the brain. 2. No enhancing metastasis identified. Electronically Signed   By: Genevie Ann M.D.   On: 06/22/2019 13:35   Ct Abdomen Pelvis W Contrast  Result Date: 06/09/2019 CLINICAL DATA:  Metastatic melanoma with lung and brain metastases. EXAM: CT CHEST, ABDOMEN, AND PELVIS WITH CONTRAST TECHNIQUE: Multidetector CT imaging of the chest, abdomen and pelvis was performed following the standard protocol during bolus administration of intravenous contrast. CONTRAST:  187m OMNIPAQUE IOHEXOL 300 MG/ML  SOLN COMPARISON:  04/20/2019 FINDINGS: CT CHEST FINDINGS Cardiovascular: The heart size is normal. No substantial pericardial effusion. Mediastinum/Nodes: No mediastinal lymphadenopathy. There is no hilar lymphadenopathy. The esophagus has normal imaging features. There is no axillary lymphadenopathy. Lungs/Pleura: The left pericardial lesion measures smaller today at 4.7 x 3.1 compared to 5.5 x 4.0 cm previously. Adjacent atelectasis/scarring is similar. No new suspicious pulmonary nodule or mass. No pleural effusion. Musculoskeletal: No worrisome lytic or sclerotic osseous abnormality. CT ABDOMEN PELVIS FINDINGS Hepatobiliary: No suspicious focal abnormality within the liver parenchyma. There is no evidence for gallstones, gallbladder wall thickening, or pericholecystic fluid. No intrahepatic or extrahepatic biliary dilation. Pancreas: No focal mass lesion. No dilatation of the main duct. No intraparenchymal cyst. No peripancreatic edema. Spleen: No splenomegaly. No focal mass lesion. Adrenals/Urinary Tract: No adrenal nodule or mass. Kidneys unremarkable. No evidence for hydroureter. The urinary bladder appears normal for the degree of distention. Stomach/Bowel: Stomach is unremarkable. No gastric wall thickening. No evidence of outlet obstruction. Duodenum is normally positioned as is the ligament of Treitz. No small bowel wall thickening. No small  bowel dilatation. No gross colonic mass. No colonic wall thickening. Vascular/Lymphatic: No abdominal aortic aneurysm. No abdominal aortic atherosclerotic calcification. There is no gastrohepatic or hepatoduodenal ligament lymphadenopathy. No intraperitoneal or retroperitoneal lymphadenopathy. No pelvic sidewall lymphadenopathy. Reproductive: The uterus is unremarkable.  There is no adnexal mass. Other: Small volume free fluid noted in the cul-de-sac. Musculoskeletal: Scattered tiny sclerotic foci in the bony pelvis are stable. No new suspicious lytic or sclerotic osseous abnormality. IMPRESSION: 1. Continued further decrease in size of the low-density lesion in the left hemithorax, now measuring 4.7 x 3.1 compared to 5.5 x 4.0 cm previously. 2. No new or progressive findings in the chest, abdomen, or pelvis on today's study. Electronically Signed   By: EMisty StanleyM.D.   On: 06/09/2019 09:02    Impression/Plan: 1. 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  The patient is doing well overall regarding her disease. Her lung lesion continues to improve based on recent reimaging. Her brain scan  is also stable and no enhancement is currently seen in the surgical site. We discussed increasing the interval of her scan to 4 months time. She is in agreement with this plan. She will continue systemic immunotherapy with Opdivo and let us know if she has questions or concerns prior to her next MRI.  2. Immunotherapy induced thrush. The patient is no longer on oral steroids. We will follow this expectantly but she continues to have episodes that Dr. Julien Nordmann is going to treat with Fluconazole.   Given current concerns for patient exposure during the COVID-19 pandemic, this encounter was conducted via telephone.  The patient has given verbal consent for this type of encounter. The time spent during this encounter was 15  minutes and 50% of that time was spent in the coordination of her care. The attendants for this meeting include Shona Simpson, [facility] and Barnett Hatter  During the encounter, Shona Simpson [facility] was located at home working remotely. Denyla Cortese  was located at home.   Carola Rhine, PAC

## 2019-07-03 DIAGNOSIS — M79672 Pain in left foot: Secondary | ICD-10-CM | POA: Diagnosis not present

## 2019-07-03 DIAGNOSIS — L603 Nail dystrophy: Secondary | ICD-10-CM | POA: Diagnosis not present

## 2019-07-04 ENCOUNTER — Telehealth: Payer: Self-pay | Admitting: Internal Medicine

## 2019-07-04 ENCOUNTER — Other Ambulatory Visit: Payer: Self-pay

## 2019-07-04 ENCOUNTER — Inpatient Hospital Stay: Payer: BC Managed Care – PPO

## 2019-07-04 ENCOUNTER — Encounter: Payer: Self-pay | Admitting: Internal Medicine

## 2019-07-04 ENCOUNTER — Inpatient Hospital Stay (HOSPITAL_BASED_OUTPATIENT_CLINIC_OR_DEPARTMENT_OTHER): Payer: BC Managed Care – PPO | Admitting: Internal Medicine

## 2019-07-04 VITALS — BP 124/81 | HR 79 | Temp 99.6°F | Resp 18 | Ht 69.0 in | Wt 149.4 lb

## 2019-07-04 DIAGNOSIS — C7802 Secondary malignant neoplasm of left lung: Secondary | ICD-10-CM

## 2019-07-04 DIAGNOSIS — K7689 Other specified diseases of liver: Secondary | ICD-10-CM | POA: Diagnosis not present

## 2019-07-04 DIAGNOSIS — C439 Malignant melanoma of skin, unspecified: Secondary | ICD-10-CM | POA: Diagnosis not present

## 2019-07-04 DIAGNOSIS — Z79899 Other long term (current) drug therapy: Secondary | ICD-10-CM

## 2019-07-04 DIAGNOSIS — C7931 Secondary malignant neoplasm of brain: Secondary | ICD-10-CM

## 2019-07-04 DIAGNOSIS — Z5112 Encounter for antineoplastic immunotherapy: Secondary | ICD-10-CM | POA: Diagnosis not present

## 2019-07-04 DIAGNOSIS — C3432 Malignant neoplasm of lower lobe, left bronchus or lung: Secondary | ICD-10-CM

## 2019-07-04 LAB — CBC WITH DIFFERENTIAL (CANCER CENTER ONLY)
Abs Immature Granulocytes: 0.01 10*3/uL (ref 0.00–0.07)
Basophils Absolute: 0 10*3/uL (ref 0.0–0.1)
Basophils Relative: 1 %
Eosinophils Absolute: 0.1 10*3/uL (ref 0.0–0.5)
Eosinophils Relative: 2 %
HCT: 41.4 % (ref 36.0–46.0)
Hemoglobin: 13.8 g/dL (ref 12.0–15.0)
Immature Granulocytes: 0 %
Lymphocytes Relative: 17 %
Lymphs Abs: 0.6 10*3/uL — ABNORMAL LOW (ref 0.7–4.0)
MCH: 30.3 pg (ref 26.0–34.0)
MCHC: 33.3 g/dL (ref 30.0–36.0)
MCV: 91 fL (ref 80.0–100.0)
Monocytes Absolute: 0.5 10*3/uL (ref 0.1–1.0)
Monocytes Relative: 13 %
Neutro Abs: 2.5 10*3/uL (ref 1.7–7.7)
Neutrophils Relative %: 67 %
Platelet Count: 249 10*3/uL (ref 150–400)
RBC: 4.55 MIL/uL (ref 3.87–5.11)
RDW: 12.8 % (ref 11.5–15.5)
WBC Count: 3.7 10*3/uL — ABNORMAL LOW (ref 4.0–10.5)
nRBC: 0 % (ref 0.0–0.2)

## 2019-07-04 LAB — CMP (CANCER CENTER ONLY)
ALT: 35 U/L (ref 0–44)
AST: 41 U/L (ref 15–41)
Albumin: 4 g/dL (ref 3.5–5.0)
Alkaline Phosphatase: 103 U/L (ref 38–126)
Anion gap: 9 (ref 5–15)
BUN: 15 mg/dL (ref 6–20)
CO2: 24 mmol/L (ref 22–32)
Calcium: 9.3 mg/dL (ref 8.9–10.3)
Chloride: 108 mmol/L (ref 98–111)
Creatinine: 1 mg/dL (ref 0.44–1.00)
GFR, Est AFR Am: >60 mL/min (ref >60)
GFR, Estimated: >60 mL/min (ref >60)
Glucose, Bld: 86 mg/dL (ref 70–99)
Potassium: 4.2 mmol/L (ref 3.5–5.1)
Sodium: 141 mmol/L (ref 135–145)
Total Bilirubin: 0.4 mg/dL (ref 0.3–1.2)
Total Protein: 6.9 g/dL (ref 6.5–8.1)

## 2019-07-04 MED ORDER — SODIUM CHLORIDE 0.9 % IV SOLN
240.0000 mg | Freq: Once | INTRAVENOUS | Status: AC
  Administered 2019-07-04: 240 mg via INTRAVENOUS
  Filled 2019-07-04: qty 24

## 2019-07-04 MED ORDER — SODIUM CHLORIDE 0.9 % IV SOLN
Freq: Once | INTRAVENOUS | Status: AC
  Administered 2019-07-04: 11:00:00 via INTRAVENOUS
  Filled 2019-07-04: qty 250

## 2019-07-04 NOTE — Patient Instructions (Signed)
[place] Discharge Instructions for Patients Receiving Chemotherapy  Today you received the following chemotherapy agents: Nivolumab (Opdivo)  To help prevent nausea and vomiting after your treatment, we encourage you to take your nausea medication as directed.   If you develop nausea and vomiting that is not controlled by your nausea medication, call the clinic.   BELOW ARE SYMPTOMS THAT SHOULD BE REPORTED IMMEDIATELY:  *FEVER GREATER THAN 100.5 F  *CHILLS WITH OR WITHOUT FEVER  NAUSEA AND VOMITING THAT IS NOT CONTROLLED WITH YOUR NAUSEA MEDICATION  *UNUSUAL SHORTNESS OF BREATH  *UNUSUAL BRUISING OR BLEEDING  TENDERNESS IN MOUTH AND THROAT WITH OR WITHOUT PRESENCE OF ULCERS  *URINARY PROBLEMS  *BOWEL PROBLEMS  UNUSUAL RASH Items with * indicate a potential emergency and should be followed up as soon as possible.  Feel free to call the clinic should you have any questions or concerns. The clinic phone number is [phone].  Please show the [place] at check-in to the Emergency Department and triage nurse.  Coronavirus (COVID-19) Are you at risk?  Are you at risk for the Coronavirus (COVID-19)?  To be considered HIGH RISK for Coronavirus (COVID-19), you have to meet the following criteria:  . Traveled to [place], [place], [place], [place] or [place]; or in the [place] to [place], [place], [place], or [place]; and have fever, cough, and shortness of breath within the last 2 weeks of travel OR . Been in close contact with a person diagnosed with COVID-19 within the last 2 weeks and have fever, cough, and shortness of breath . IF YOU DO NOT MEET THESE CRITERIA, YOU ARE CONSIDERED LOW RISK FOR COVID-19.  What to do if you are HIGH RISK for COVID-19?  Marland Kitchen If you are having a medical emergency, call 911. . Seek medical care right away. Before you go to a doctor's office, urgent care or emergency department, call ahead and tell them about  your recent travel, contact with someone diagnosed with COVID-19, and your symptoms. You should receive instructions from your physician's office regarding next steps of care.  . When you arrive at healthcare provider, tell the healthcare staff immediately you have returned from visiting [place], [place], [place], [place] or [place]; or traveled in the [place] to [place], [place], [place], or [place]; in the last two weeks or you have been in close contact with a person diagnosed with COVID-19 in the last 2 weeks.   . Tell the health care staff about your symptoms: fever, cough and shortness of breath. . After you have been seen by a medical provider, you will be either: o Tested for (COVID-19) and discharged home on quarantine except to seek medical care if symptoms worsen, and asked to  - Stay home and avoid contact with others until you get your results (4-5 days)  - Avoid travel on public transportation if possible (such as bus, train, or airplane) or o Sent to the Emergency Department by EMS for evaluation, COVID-19 testing, and possible admission depending on your condition and test results.  What to do if you are LOW RISK for COVID-19?  Reduce your risk of any infection by using the same precautions used for avoiding the common cold or flu:  Marland Kitchen Wash your hands often with soap and warm water for at least 20 seconds.  If soap and water are not readily available, use an alcohol-based hand sanitizer with at least 60% alcohol.  . If coughing or  sneezing, cover your mouth and nose by coughing or sneezing into the elbow areas of your shirt or coat, into a tissue or into your sleeve (not your hands). . Avoid shaking hands with others and consider head nods or verbal greetings only. . Avoid touching your eyes, nose, or mouth with unwashed hands.  . Avoid close contact with people who are sick. . Avoid places or events with large numbers of people in one location, like concerts or sporting  events. . Carefully consider travel plans you have or are making. . If you are planning any travel outside or inside the [place], visit the CDC's Travelers' Health webpage for the latest health notices. . If you have some symptoms but not all symptoms, continue to monitor at home and seek medical attention if your symptoms worsen. . If you are having a medical emergency, call 911.   [facility] / e-Visit: [email or web address]         [facility]: [facility]: [phone]                   [facility]: [phone]

## 2019-07-04 NOTE — Progress Notes (Signed)
[place] [phone]   Fax:([phone]  OFFICE PROGRESS NOTE  System, Pcp Not In No address on file  DIAGNOSIS: Metastatic high-grade neoplasm consistent with metastatic malignant melanoma based on the most recent pathology report from [facility] in October 2019, presented with large left left upper/left lower lobe mass with a hypermetabolic AP window lymph node as well as solitary metastatic brain lesion diagnosed in May 2019.  Biomarker Findings Microsatellite status - MS-Stable Tumor Mutational Burden - TMB-Intermediate (16 Muts/Mb) Genomic Findings For a complete list of the genes assayed, please refer to the Appendix. CD274 (PD-L1) amplification NRAS Q61R PDCD1LG2 (PD-L2) amplification MYC amplification EPHB1 amplification JAK2 amplification RB1 Q93*, T364* TERT promoter -146C>T TP53 C275W   PRIOR THERAPY: 1) left temporal craniotomy with resection of tumor with intraoperative stereotactic guidance for volumetric resection under the care of Dr. Annette Stable on 04/29/2018. 2) First-line treatment with immunotherapy with Keytruda 200 mg IV every 3 weeks.  First dose June 02, 2018.  Status post 3 cycles.  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 3) palliative radiotherapy to the left lung mass under the care of Dr. Lisbeth Renshaw completed on 08/24/2018. 4) Resuming her treatment again with Keytruda 200 mg IV every 3 weeks, first dose 09/08/2018.  Status post 2 cycles.  CURRENT THERAPY: Treatment with immunotherapy with ipilimumab 3 mg/KG and nivolumab 1 mg/KG every 3 weeks for the first 4 cycles followed by maintenance nivolumab 240 mg IV every 2 weeks.  First dose of this treatment October 26, 2018.  Status post 12 cycles.  Ipilimumab was discontinued after cycle #2 for the significant liver dysfunction.  Starting from cycle #3 the patient is on treatment with single agent nivolumab.  INTERVAL  HISTORY: Leslie [name] 43 y.o. female returns to the clinic today for follow-up visit.  The patient is feeling fine today with no concerning complaints.  She denied having any chest pain, shortness of breath, cough or hemoptysis.  She denied having any fever or chills.  She has no nausea, vomiting, diarrhea or constipation.  She denied having any headache or visual changes.  She denied having any weight loss or night sweats.  She continues to tolerate her treatment with nivolumab fairly well.  She has occasions oral thrush but not with the last cycle of her treatment.  The patient had repeat CT scan of the chest, abdomen pelvis performed few weeks ago and she is here for evaluation and discussion of her scan results and treatment options.  MEDICAL HISTORY: Past Medical History:  Diagnosis Date   met melanoma to brain and lung dx'd 04/2018   unknown origin   Migraines    Yeast infection     ALLERGIES:  is allergic to diflucan [fluconazole].  MEDICATIONS:  Current Outpatient Medications  Medication Sig Dispense Refill   Ascorbic Acid (LIQUID C 500) 500 MG/15ML LIQD Take 1,000 mg by mouth daily.     Cholecalciferol 1000 UNIT/10ML LIQD Take by mouth.     Doxylamine Succinate, Sleep, (UNISOM PO) Take by mouth.     GAMMA AMINOBUTYRIC ACID PO Take by mouth.     HCA CALCIUM-MAGNESIUM-ZINC PO Take by mouth. Taking one half tablespoon daily     magic mouthwash w/lidocaine SOLN Take 5 mLs by mouth 4 (four) times daily as needed for mouth pain. (Patient not taking: Reported on 05/23/2019) 240 mL 1   Melatonin 5 MG CAPS Take by mouth.     Multiple Vitamin (MULTIVITAMIN) tablet Take  1 tablet by mouth daily.     Omega 3-6-9 Fatty Acids (OMEGA 3-6-9 COMPLEX PO) Take 1 capsule by mouth daily.     OVER THE COUNTER MEDICATION      prochlorperazine (COMPAZINE) 10 MG tablet Take 1 tablet (10 mg total) by mouth every 6 (six) hours as needed for nausea or vomiting. 30 tablet 0   temazepam  (RESTORIL) 15 MG capsule Take 1 capsule (15 mg total) by mouth at bedtime as needed for sleep. 20 capsule 0   No current facility-administered medications for this visit.     SURGICAL HISTORY:  Past Surgical History:  Procedure Laterality Date   APPLICATION OF CRANIAL NAVIGATION Left 04/29/2018   Procedure: APPLICATION OF CRANIAL NAVIGATION;  Surgeon: Earnie Larsson, MD;  Location: [place];  Service: Neurosurgery;  Laterality: Left;   CRANIOTOMY Left 04/29/2018   Procedure: LEFT CRANIOTOMY FOR  TUMOR BRAIN LAB;  Surgeon: Earnie Larsson, MD;  Location: [place];  Service: Neurosurgery;  Laterality: Left;    REVIEW OF SYSTEMS:  Constitutional: negative Eyes: negative Ears, nose, mouth, throat, and face: negative Respiratory: negative Cardiovascular: negative Gastrointestinal: negative Genitourinary:negative Integument/breast: negative Hematologic/lymphatic: negative Musculoskeletal:negative Neurological: negative Behavioral/Psych: negative Endocrine: negative Allergic/Immunologic: negative   PHYSICAL EXAMINATION: General appearance: alert, cooperative and no distress Head: Normocephalic, without obvious abnormality, atraumatic Neck: no adenopathy, no JVD, supple, symmetrical, trachea midline and thyroid not enlarged, symmetric, no tenderness/mass/nodules Lymph nodes: Cervical, supraclavicular, and axillary nodes normal. Resp: clear to auscultation bilaterally Back: negative, symmetric, no curvature. ROM normal. No CVA tenderness. Cardio: regular rate and rhythm, S1, S2 normal, no murmur, click, rub or gallop GI: soft, non-tender; bowel sounds normal; no masses,  no organomegaly Extremities: extremities normal, atraumatic, no cyanosis or edema Neurologic: Alert and oriented X 3, normal strength and tone. Normal symmetric reflexes. Normal coordination and gait  ECOG PERFORMANCE STATUS: 0 - Asymptomatic  Blood pressure 124/81, pulse 79, temperature 99.6 F (37.6 C), temperature source  Oral, resp. rate 18, height _0  (1.753 m), weight 149 lb 6.4 oz (67.8 kg), SpO2 97 %, unknown if currently breastfeeding.  LABORATORY DATA: Lab Results  Component Value Date   WBC 3.7 (L) 07/04/2019   HGB 13.8 07/04/2019   HCT 41.4 07/04/2019   MCV 91.0 07/04/2019   PLT 249 07/04/2019      Chemistry      Component Value Date/Time   NA 140 06/19/2019 0907   K 4.1 06/19/2019 0907   CL 106 06/19/2019 0907   CO2 25 06/19/2019 0907   BUN 9 06/19/2019 0907   CREATININE 0.82 06/19/2019 0907      Component Value Date/Time   CALCIUM 8.6 (L) 06/19/2019 0907   ALKPHOS 80 06/19/2019 0907   AST 39 06/19/2019 0907   ALT 39 06/19/2019 0907   BILITOT 0.2 (L) 06/19/2019 0907       RADIOGRAPHIC STUDIES: Ct Chest W Contrast  Result Date: 06/09/2019 CLINICAL DATA:  Metastatic melanoma with lung and brain metastases. EXAM: CT CHEST, ABDOMEN, AND PELVIS WITH CONTRAST TECHNIQUE: Multidetector CT imaging of the chest, abdomen and pelvis was performed following the standard protocol during bolus administration of intravenous contrast. CONTRAST:  123m OMNIPAQUE IOHEXOL 300 MG/ML  SOLN COMPARISON:  04/20/2019 FINDINGS: CT CHEST FINDINGS Cardiovascular: The heart size is normal. No substantial pericardial effusion. Mediastinum/Nodes: No mediastinal lymphadenopathy. There is no hilar lymphadenopathy. The esophagus has normal imaging features. There is no axillary lymphadenopathy. Lungs/Pleura: The left pericardial lesion measures smaller today at 4.7 x 3.1 compared to 5.5  x 4.0 cm previously. Adjacent atelectasis/scarring is similar. No new suspicious pulmonary nodule or mass. No pleural effusion. Musculoskeletal: No worrisome lytic or sclerotic osseous abnormality. CT ABDOMEN PELVIS FINDINGS Hepatobiliary: No suspicious focal abnormality within the liver parenchyma. There is no evidence for gallstones, gallbladder wall thickening, or pericholecystic fluid. No intrahepatic or extrahepatic biliary dilation.  Pancreas: No focal mass lesion. No dilatation of the main duct. No intraparenchymal cyst. No peripancreatic edema. Spleen: No splenomegaly. No focal mass lesion. Adrenals/Urinary Tract: No adrenal nodule or mass. Kidneys unremarkable. No evidence for hydroureter. The urinary bladder appears normal for the degree of distention. Stomach/Bowel: Stomach is unremarkable. No gastric wall thickening. No evidence of outlet obstruction. Duodenum is normally positioned as is the ligament of Treitz. No small bowel wall thickening. No small bowel dilatation. No gross colonic mass. No colonic wall thickening. Vascular/Lymphatic: No abdominal aortic aneurysm. No abdominal aortic atherosclerotic calcification. There is no gastrohepatic or hepatoduodenal ligament lymphadenopathy. No intraperitoneal or retroperitoneal lymphadenopathy. No pelvic sidewall lymphadenopathy. Reproductive: The uterus is unremarkable.  There is no adnexal mass. Other: Small volume free fluid noted in the cul-de-sac. Musculoskeletal: Scattered tiny sclerotic foci in the bony pelvis are stable. No new suspicious lytic or sclerotic osseous abnormality. IMPRESSION: 1. Continued further decrease in size of the low-density lesion in the left hemithorax, now measuring 4.7 x 3.1 compared to 5.5 x 4.0 cm previously. 2. No new or progressive findings in the chest, abdomen, or pelvis on today's study. Electronically Signed   By: Misty Stanley M.D.   On: 06/09/2019 09:02   Mr Jeri Cos NG Contrast  Result Date: 06/22/2019 CLINICAL DATA:  43 year old female with metastatic melanoma. Left posterior temporal metastasis resected with postop SRS in June 2019. EXAM: MRI HEAD WITHOUT AND WITH CONTRAST TECHNIQUE: Multiplanar, multiecho pulse sequences of the brain and surrounding structures were obtained without and with intravenous contrast. CONTRAST:  16m MULTIHANCE GADOBENATE DIMEGLUMINE 529 MG/ML IV SOLN COMPARISON:  Brain MRI 04/13/2019 and earlier. FINDINGS: BRAIN New  Lesions: None. Larger lesions: None. Stable or Smaller lesions: Stable post treatment appearance of the anterior left temporal lobe with a small inferior temporal resection cavity and regional T2/FLAIR hyperintensity. No progressive signal changes and no suspicious enhancement following contrast. Other Brain findings: No restricted diffusion to suggest acute infarction. No midline shift, mass effect, evidence of mass lesion, ventriculomegaly, extra-axial collection or acute intracranial hemorrhage. Cervicomedullary junction and pituitary are within normal limits. Susceptibility associated with the left frontotemporal craniotomy and resection. No other chronic blood products. Small scattered nonspecific cerebral white matter T2 and FLAIR hyperintense foci are stable and slightly more numerous in the left hemisphere. Vascular: Major intracranial vascular flow voids are preserved. The major dural venous sinuses are enhancing and appear patent. Skull and upper cervical spine: Negative visible cervical spine and spinal cord. Visible bone marrow signal is stable and within normal limits. Sinuses/Orbits: Stable and negative. Other: Mastoids remain clear. Visible internal auditory structures appear normal. Stable scalp soft tissues. IMPRESSION: 1. Stable and satisfactory post treatment appearance of the brain. 2. No enhancing metastasis identified. Electronically Signed   By: HGenevie AnnM.D.   On: 06/22/2019 13:35   Ct Abdomen Pelvis W Contrast  Result Date: 06/09/2019 CLINICAL DATA:  Metastatic melanoma with lung and brain metastases. EXAM: CT CHEST, ABDOMEN, AND PELVIS WITH CONTRAST TECHNIQUE: Multidetector CT imaging of the chest, abdomen and pelvis was performed following the standard protocol during bolus administration of intravenous contrast. CONTRAST:  103mOMNIPAQUE IOHEXOL 300 MG/ML  SOLN  COMPARISON:  04/20/2019 FINDINGS: CT CHEST FINDINGS Cardiovascular: The heart size is normal. No substantial pericardial  effusion. Mediastinum/Nodes: No mediastinal lymphadenopathy. There is no hilar lymphadenopathy. The esophagus has normal imaging features. There is no axillary lymphadenopathy. Lungs/Pleura: The left pericardial lesion measures smaller today at 4.7 x 3.1 compared to 5.5 x 4.0 cm previously. Adjacent atelectasis/scarring is similar. No new suspicious pulmonary nodule or mass. No pleural effusion. Musculoskeletal: No worrisome lytic or sclerotic osseous abnormality. CT ABDOMEN PELVIS FINDINGS Hepatobiliary: No suspicious focal abnormality within the liver parenchyma. There is no evidence for gallstones, gallbladder wall thickening, or pericholecystic fluid. No intrahepatic or extrahepatic biliary dilation. Pancreas: No focal mass lesion. No dilatation of the main duct. No intraparenchymal cyst. No peripancreatic edema. Spleen: No splenomegaly. No focal mass lesion. Adrenals/Urinary Tract: No adrenal nodule or mass. Kidneys unremarkable. No evidence for hydroureter. The urinary bladder appears normal for the degree of distention. Stomach/Bowel: Stomach is unremarkable. No gastric wall thickening. No evidence of outlet obstruction. Duodenum is normally positioned as is the ligament of Treitz. No small bowel wall thickening. No small bowel dilatation. No gross colonic mass. No colonic wall thickening. Vascular/Lymphatic: No abdominal aortic aneurysm. No abdominal aortic atherosclerotic calcification. There is no gastrohepatic or hepatoduodenal ligament lymphadenopathy. No intraperitoneal or retroperitoneal lymphadenopathy. No pelvic sidewall lymphadenopathy. Reproductive: The uterus is unremarkable.  There is no adnexal mass. Other: Small volume free fluid noted in the cul-de-sac. Musculoskeletal: Scattered tiny sclerotic foci in the bony pelvis are stable. No new suspicious lytic or sclerotic osseous abnormality. IMPRESSION: 1. Continued further decrease in size of the low-density lesion in the left hemithorax, now  measuring 4.7 x 3.1 compared to 5.5 x 4.0 cm previously. 2. No new or progressive findings in the chest, abdomen, or pelvis on today's study. Electronically Signed   By: Misty Stanley M.D.   On: 06/09/2019 09:02    ASSESSMENT AND PLAN: This is a very pleasant 43 years old white female with highly suspicious metastatic malignant melanoma presented with large mass in the left upper/left lower lobe and mediastinal lymphadenopathy as well as solitary brain metastasis status post left temporal craniotomy and resection of tumor on 04/29/2018 and she is recovering well from her surgery. The patient completed stereotactic radiotherapy to the resection cavity next week under the care of Dr. Lisbeth Renshaw. The patient underwent treatment with immunotherapy with Keytruda 200 mg IV every 3 weeks status post 3 cycles.   Her scan after cycle #3 showed enlargement of the left upper lobe lung mass.  This was also suspicious for pseudo-progression on immunotherapy.  The patient was started on a palliative course of radiotherapy to the left upper lobe lung mass and she tolerated this treatment fairly well.  She was seen by Dr. Emelda Brothers at [facility] and she recommended for the patient to resume her treatment with [facility] for now until she undergoes further molecular studies.  The patient was treated with 2 more cycles of Keytruda and tolerated the treatment well. Her recent CT scan of the chest, abdomen and pelvis showed some improvement in the left upper lobe lung mass but there is still concern about pericardial invasion. I personally and independently reviewed the scan images and discussed the result and showed the images to the patient and her boyfriend today.  She is still not a good candidate for surgical resection because of the pericardial invasion. The final pathology report and recommendation from [facility] was consistent with metastatic melanoma and the recommendation is to switch the patient  to  a combination immunotherapy with Ipilumumab and nivolumab. The patient is currently undergoing treatment with immunotherapy with ipilimumab and nivolumab status post 2 cycles.  Her treatment was on hold for more than 6 weeks secondary to grade 3 hepatic dysfunction.  She had improvement of her liver enzyme after a prolonged treatment with a steroid with a tapering schedule. She resumed her treatment again with single agent nivolumab.  She is status post 12 cycles of treatment. The patient continues to tolerate this treatment well with no concerning adverse effects. She had repeat CT scan of the chest, abdomen pelvis performed recently.  I personally and independently reviewed the scans and discussed the results with the patient today. Her scan showed further improvement in the left upper lobe lung mass.  The patient was asking question about the possibility of surgical intervention at this point but I explained to her that the tumor still have significant contact with the pericardium and she is still not a good surgical candidate. I will also consider referring her to Dr. Clance Boll a melanoma specialist at [facility] for evaluation and to see if he has any other treatment options for her condition especially if she developed disease progression in the future.  She is interested in this option and I personally spoke to Dr. Clance Boll. I will see her back for follow-up visit in 4 weeks for evaluation before the next cycle of her treatment. The patient was advised to call immediately if she has any concerning symptoms in the interval and especially if she has any recurrent episodes of the chest pain. The patient voices understanding of current disease status and treatment options and is in agreement with the current care plan. All questions were answered. The patient knows to call the clinic with any problems, questions or concerns. We can certainly see the patient much sooner if necessary.  Disclaimer:  This note was dictated with voice recognition software. Similar sounding words can inadvertently be transcribed and may not be corrected upon review.

## 2019-07-04 NOTE — Telephone Encounter (Signed)
Scheduled appt per 7/21 LOS - pt to get an updated schedule next visit.

## 2019-07-05 ENCOUNTER — Telehealth: Payer: Self-pay | Admitting: *Deleted

## 2019-07-05 NOTE — Telephone Encounter (Signed)
Referral faxed to [place] [facility] - att Referrals/ Dr. Clance Boll - release [phone]

## 2019-07-10 ENCOUNTER — Telehealth: Payer: Self-pay | Admitting: Medical Oncology

## 2019-07-10 NOTE — Telephone Encounter (Signed)
Form ready for pick up =pt notified.

## 2019-07-13 DIAGNOSIS — M9903 Segmental and somatic dysfunction of lumbar region: Secondary | ICD-10-CM | POA: Diagnosis not present

## 2019-07-13 DIAGNOSIS — M2560 Stiffness of unspecified joint, not elsewhere classified: Secondary | ICD-10-CM | POA: Diagnosis not present

## 2019-07-13 DIAGNOSIS — M9901 Segmental and somatic dysfunction of cervical region: Secondary | ICD-10-CM | POA: Diagnosis not present

## 2019-07-13 DIAGNOSIS — M62838 Other muscle spasm: Secondary | ICD-10-CM | POA: Diagnosis not present

## 2019-07-17 ENCOUNTER — Other Ambulatory Visit: Payer: Self-pay | Admitting: Medical Oncology

## 2019-07-17 DIAGNOSIS — C7802 Secondary malignant neoplasm of left lung: Secondary | ICD-10-CM

## 2019-07-18 ENCOUNTER — Other Ambulatory Visit: Payer: Self-pay

## 2019-07-18 ENCOUNTER — Ambulatory Visit: Payer: BC Managed Care – PPO | Admitting: Physician Assistant

## 2019-07-18 ENCOUNTER — Inpatient Hospital Stay: Payer: BC Managed Care – PPO | Attending: Internal Medicine

## 2019-07-18 ENCOUNTER — Inpatient Hospital Stay: Payer: BC Managed Care – PPO

## 2019-07-18 VITALS — BP 126/83 | HR 70 | Temp 97.7°F | Resp 17 | Wt 149.0 lb

## 2019-07-18 DIAGNOSIS — C499 Malignant neoplasm of connective and soft tissue, unspecified: Secondary | ICD-10-CM | POA: Insufficient documentation

## 2019-07-18 DIAGNOSIS — C7931 Secondary malignant neoplasm of brain: Secondary | ICD-10-CM | POA: Diagnosis not present

## 2019-07-18 DIAGNOSIS — C7802 Secondary malignant neoplasm of left lung: Secondary | ICD-10-CM

## 2019-07-18 DIAGNOSIS — Z79899 Other long term (current) drug therapy: Secondary | ICD-10-CM | POA: Insufficient documentation

## 2019-07-18 DIAGNOSIS — Z5112 Encounter for antineoplastic immunotherapy: Secondary | ICD-10-CM | POA: Diagnosis not present

## 2019-07-18 LAB — CBC WITH DIFFERENTIAL (CANCER CENTER ONLY)
Abs Immature Granulocytes: 0 10*3/uL (ref 0.00–0.07)
Basophils Absolute: 0 10*3/uL (ref 0.0–0.1)
Basophils Relative: 1 %
Eosinophils Absolute: 0.1 10*3/uL (ref 0.0–0.5)
Eosinophils Relative: 3 %
HCT: 42.2 % (ref 36.0–46.0)
Hemoglobin: 14.2 g/dL (ref 12.0–15.0)
Immature Granulocytes: 0 %
Lymphocytes Relative: 18 %
Lymphs Abs: 0.6 10*3/uL — ABNORMAL LOW (ref 0.7–4.0)
MCH: 30.7 pg (ref 26.0–34.0)
MCHC: 33.6 g/dL (ref 30.0–36.0)
MCV: 91.1 fL (ref 80.0–100.0)
Monocytes Absolute: 0.5 10*3/uL (ref 0.1–1.0)
Monocytes Relative: 16 %
Neutro Abs: 2.1 10*3/uL (ref 1.7–7.7)
Neutrophils Relative %: 62 %
Platelet Count: 240 10*3/uL (ref 150–400)
RBC: 4.63 MIL/uL (ref 3.87–5.11)
RDW: 13.1 % (ref 11.5–15.5)
WBC Count: 3.5 10*3/uL — ABNORMAL LOW (ref 4.0–10.5)
nRBC: 0 % (ref 0.0–0.2)

## 2019-07-18 LAB — CMP (CANCER CENTER ONLY)
ALT: 39 U/L (ref 0–44)
AST: 45 U/L — ABNORMAL HIGH (ref 15–41)
Albumin: 4 g/dL (ref 3.5–5.0)
Alkaline Phosphatase: 93 U/L (ref 38–126)
Anion gap: 9 (ref 5–15)
BUN: 14 mg/dL (ref 6–20)
CO2: 26 mmol/L (ref 22–32)
Calcium: 9.4 mg/dL (ref 8.9–10.3)
Chloride: 105 mmol/L (ref 98–111)
Creatinine: 0.95 mg/dL (ref 0.44–1.00)
GFR, Est AFR Am: >60 mL/min (ref >60)
GFR, Estimated: >60 mL/min (ref >60)
Glucose, Bld: 75 mg/dL (ref 70–99)
Potassium: 4 mmol/L (ref 3.5–5.1)
Sodium: 140 mmol/L (ref 135–145)
Total Bilirubin: 0.3 mg/dL (ref 0.3–1.2)
Total Protein: 6.8 g/dL (ref 6.5–8.1)

## 2019-07-18 LAB — TSH: TSH: 2.89 u[IU]/mL (ref 0.308–3.960)

## 2019-07-18 MED ORDER — SODIUM CHLORIDE 0.9 % IV SOLN
Freq: Once | INTRAVENOUS | Status: AC
  Administered 2019-07-18: 11:00:00 via INTRAVENOUS
  Filled 2019-07-18: qty 250

## 2019-07-18 MED ORDER — SODIUM CHLORIDE 0.9 % IV SOLN
240.0000 mg | Freq: Once | INTRAVENOUS | Status: AC
  Administered 2019-07-18: 240 mg via INTRAVENOUS
  Filled 2019-07-18: qty 24

## 2019-07-18 NOTE — Patient Instructions (Signed)
[facility] Discharge Instructions for Patients Receiving Chemotherapy  Today you received the following chemotherapy agents: Opdivo.  To help prevent nausea and vomiting after your treatment, we encourage you to take your nausea medication as directed.   If you develop nausea and vomiting that is not controlled by your nausea medication, call the clinic.   BELOW ARE SYMPTOMS THAT SHOULD BE REPORTED IMMEDIATELY:  *FEVER GREATER THAN 100.5 F  *CHILLS WITH OR WITHOUT FEVER  NAUSEA AND VOMITING THAT IS NOT CONTROLLED WITH YOUR NAUSEA MEDICATION  *UNUSUAL SHORTNESS OF BREATH  *UNUSUAL BRUISING OR BLEEDING  TENDERNESS IN MOUTH AND THROAT WITH OR WITHOUT PRESENCE OF ULCERS  *URINARY PROBLEMS  *BOWEL PROBLEMS  UNUSUAL RASH Items with * indicate a potential emergency and should be followed up as soon as possible.  Feel free to call the clinic should you have any questions or concerns. The clinic phone number is [phone].  Please show the [place] at check-in to the Emergency Department and triage nurse.

## 2019-07-25 DIAGNOSIS — C7931 Secondary malignant neoplasm of brain: Secondary | ICD-10-CM | POA: Diagnosis not present

## 2019-07-25 DIAGNOSIS — C799 Secondary malignant neoplasm of unspecified site: Secondary | ICD-10-CM | POA: Diagnosis not present

## 2019-07-25 DIAGNOSIS — C439 Malignant melanoma of skin, unspecified: Secondary | ICD-10-CM | POA: Diagnosis not present

## 2019-07-25 DIAGNOSIS — Z6821 Body mass index (BMI) 21.0-21.9, adult: Secondary | ICD-10-CM | POA: Diagnosis not present

## 2019-07-31 ENCOUNTER — Other Ambulatory Visit: Payer: Self-pay | Admitting: *Deleted

## 2019-07-31 DIAGNOSIS — C3432 Malignant neoplasm of lower lobe, left bronchus or lung: Secondary | ICD-10-CM

## 2019-08-01 ENCOUNTER — Inpatient Hospital Stay (HOSPITAL_BASED_OUTPATIENT_CLINIC_OR_DEPARTMENT_OTHER): Payer: BC Managed Care – PPO | Admitting: Physician Assistant

## 2019-08-01 ENCOUNTER — Encounter: Payer: Self-pay | Admitting: Physician Assistant

## 2019-08-01 ENCOUNTER — Inpatient Hospital Stay: Payer: BC Managed Care – PPO

## 2019-08-01 ENCOUNTER — Other Ambulatory Visit: Payer: Self-pay

## 2019-08-01 ENCOUNTER — Telehealth: Payer: Self-pay | Admitting: Medical Oncology

## 2019-08-01 VITALS — BP 121/74 | HR 80 | Temp 98.3°F | Resp 18 | Ht 69.0 in | Wt 150.0 lb

## 2019-08-01 DIAGNOSIS — C499 Malignant neoplasm of connective and soft tissue, unspecified: Secondary | ICD-10-CM | POA: Diagnosis not present

## 2019-08-01 DIAGNOSIS — C7802 Secondary malignant neoplasm of left lung: Secondary | ICD-10-CM | POA: Diagnosis not present

## 2019-08-01 DIAGNOSIS — C3432 Malignant neoplasm of lower lobe, left bronchus or lung: Secondary | ICD-10-CM | POA: Diagnosis not present

## 2019-08-01 DIAGNOSIS — Z5112 Encounter for antineoplastic immunotherapy: Secondary | ICD-10-CM | POA: Diagnosis not present

## 2019-08-01 DIAGNOSIS — C7931 Secondary malignant neoplasm of brain: Secondary | ICD-10-CM | POA: Diagnosis not present

## 2019-08-01 DIAGNOSIS — Z79899 Other long term (current) drug therapy: Secondary | ICD-10-CM | POA: Diagnosis not present

## 2019-08-01 LAB — CBC WITH DIFFERENTIAL (CANCER CENTER ONLY)
Abs Immature Granulocytes: 0 10*3/uL (ref 0.00–0.07)
Basophils Absolute: 0 10*3/uL (ref 0.0–0.1)
Basophils Relative: 1 %
Eosinophils Absolute: 0.1 10*3/uL (ref 0.0–0.5)
Eosinophils Relative: 1 %
HCT: 40.9 % (ref 36.0–46.0)
Hemoglobin: 13.6 g/dL (ref 12.0–15.0)
Immature Granulocytes: 0 %
Lymphocytes Relative: 23 %
Lymphs Abs: 0.8 10*3/uL (ref 0.7–4.0)
MCH: 30.7 pg (ref 26.0–34.0)
MCHC: 33.3 g/dL (ref 30.0–36.0)
MCV: 92.3 fL (ref 80.0–100.0)
Monocytes Absolute: 0.5 10*3/uL (ref 0.1–1.0)
Monocytes Relative: 13 %
Neutro Abs: 2.3 10*3/uL (ref 1.7–7.7)
Neutrophils Relative %: 62 %
Platelet Count: 257 10*3/uL (ref 150–400)
RBC: 4.43 MIL/uL (ref 3.87–5.11)
RDW: 13.2 % (ref 11.5–15.5)
WBC Count: 3.7 10*3/uL — ABNORMAL LOW (ref 4.0–10.5)
nRBC: 0 % (ref 0.0–0.2)

## 2019-08-01 LAB — CMP (CANCER CENTER ONLY)
ALT: 39 U/L (ref 0–44)
AST: 44 U/L — ABNORMAL HIGH (ref 15–41)
Albumin: 4 g/dL (ref 3.5–5.0)
Alkaline Phosphatase: 89 U/L (ref 38–126)
Anion gap: 10 (ref 5–15)
BUN: 14 mg/dL (ref 6–20)
CO2: 25 mmol/L (ref 22–32)
Calcium: 8.9 mg/dL (ref 8.9–10.3)
Chloride: 105 mmol/L (ref 98–111)
Creatinine: 1.07 mg/dL — ABNORMAL HIGH (ref 0.44–1.00)
GFR, Est AFR Am: >60 mL/min (ref >60)
GFR, Estimated: >60 mL/min (ref >60)
Glucose, Bld: 78 mg/dL (ref 70–99)
Potassium: 4.2 mmol/L (ref 3.5–5.1)
Sodium: 140 mmol/L (ref 135–145)
Total Bilirubin: 0.4 mg/dL (ref 0.3–1.2)
Total Protein: 6.7 g/dL (ref 6.5–8.1)

## 2019-08-01 MED ORDER — SODIUM CHLORIDE 0.9 % IV SOLN
240.0000 mg | Freq: Once | INTRAVENOUS | Status: AC
  Administered 2019-08-01: 240 mg via INTRAVENOUS
  Filled 2019-08-01: qty 24

## 2019-08-01 MED ORDER — SODIUM CHLORIDE 0.9 % IV SOLN
Freq: Once | INTRAVENOUS | Status: AC
  Administered 2019-08-01: 14:00:00 via INTRAVENOUS
  Filled 2019-08-01: qty 250

## 2019-08-01 NOTE — Progress Notes (Signed)
[name] OFFICE PROGRESS NOTE  System, Pcp Not In No address on file  DIAGNOSIS: Metastatic high-grade neoplasm consistent with metastatic malignant melanoma based on the most recent pathology report from [facility] in October 2019, presented with large left left upper/left lower lobe mass with a hypermetabolic AP window lymph node as well as solitary metastatic brain lesion diagnosed in May 2019.  Biomarker Findings Microsatellite status - MS-Stable Tumor Mutational Burden - TMB-Intermediate (16 Muts/Mb) Genomic Findings For a complete list of the genes assayed, please refer to the Appendix. CD274 (PD-L1) amplification NRAS Q61R PDCD1LG2 (PD-L2) amplification MYC amplification EPHB1 amplification JAK2 amplification RB1 Q93*, Z610* TERT promoter -146C>T TP53 C275W   PRIOR THERAPY:  1) left temporal craniotomy with resection of tumor with intraoperative stereotactic guidance for volumetric resection under the care of Dr. Annette Stable on 04/29/2018. 2) First-line treatment with immunotherapy with Keytruda 200 mg IV every 3 weeks.  First dose June 02, 2018.  Status post 3 cycles.  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 3) palliative radiotherapy to the left lung mass under the care of Dr. Lisbeth Renshaw completed on 08/24/2018. 4) Resuming her treatment again with Keytruda 200 mg IV every 3 weeks, first dose 09/08/2018.  Status post 2 cycles.  CURRENT THERAPY: Treatment with immunotherapy with ipilimumab 3 mg/KG and nivolumab 1 mg/KG every 3 weeks. First dose on October 26, 2018.  Status post 14 cycles.  Ipilimumab was discontinued after cycle #2 due to significant liver dysfunction.  Starting from cycle #3 the patient is on treatment with single agent nivolumab.  INTERVAL HISTORY: Leslie [name] 43 y.o. female returns to the clinic for a follow-up visit.  The patient is feeling well today without any concerning complaints. The  patient recently met with Dr. Gaspar Skeeters at [facility] who is a specialist in [place]. The patient was interested in being evaluated by him to discuss more about her condition and treatment options, should she develop disease progression in the future.   Today, the patient is feeling well. She has been tolerating her current treatment with opdivo well without any concerning adverse side effects. She denies any nausea, vomiting, diarrhea, or constipation. She denies any headaches or visual changes. She is followed closely by radiation oncology and her neurosurgeon for her history of metastatic disease to the brain. She follow with her dermatologist every 6 months for a routine skin check. She denies any chest pain, shortness of breath, cough, or hemoptysis. She denies any rashes or skin changes. She denies any fever, chills, night sweats, or weight loss. She is here today for evaluation prior to starting her next cycle of treatment.   MEDICAL HISTORY: Past Medical History:  Diagnosis Date  . 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 Migraines   . Yeast infection     ALLERGIES:  is allergic to diflucan [fluconazole].  MEDICATIONS:  Current Outpatient Medications  Medication Sig Dispense Refill  . AMERICAN GINSENG PO Take 1,500-3,000 mg by mouth daily. Depending on level of fatigue    . Amino Acids (L-CARNITINE) LIQD Take 2,000 mg by mouth daily. Liquid L-Carnitine 2000 mg    . Ascorbic Acid (LIQUID C 500) 500 MG/15ML LIQD Take 1,000 mg by mouth daily.    . Beta Glucan POWD 1 Dose by Does not apply route daily. Beta D Glucan 400 mg    . Calcium-Magnesium (CAL-MAG PO) Take 15 mLs by mouth daily. Marine Based Cal - Mag (500 mg  Calcium / 250 mg Mag) 15 ml total    . CREATINE MONOHYDRATE PO Take 6 g by mouth daily. Creatine MonoHydrate 6 g (on training cycle)    . Hyaluronic Acid-Vitamin C (HYALURONIC ACID PO) Take 100 mg by mouth daily. Liquid Hyaluronic Acid 100 mg    .  Lysine POWD 1,600 mg by Does not apply route daily. L-Lysine Powder 1600 mg    . NON FORMULARY Take 1 Dose by mouth daily. Cordyceps 750 mg    . NON FORMULARY Take 1 Dose by mouth daily. Liver Herbal Supplement 675 mg    . NON FORMULARY Take 1 Dose by mouth daily. Reishi Mushroom 500 mg    . Omega 3-6-9 Fatty Acids (OMEGA-3-6-9 PO) Take 2 capsules by mouth daily. Plant Based Omega 3-6-9 2 capsules = 1825 mg    . QUERCETIN PO Take 1 tablet by mouth daily. Quercetin 400 mg / Bromelain 85 mg    . RESVERATROL PO Take 1,200 mg by mouth daily. Trans Resveratrol    . Cholecalciferol 1000 UNIT/10ML LIQD Take by mouth.    Marland Kitchen GAMMA AMINOBUTYRIC ACID PO Take by mouth.    Marland Kitchen HCA CALCIUM-MAGNESIUM-ZINC PO Take by mouth. Taking one half tablespoon daily    . Multiple Vitamin (MULTIVITAMIN) tablet Take 1 tablet by mouth daily.    . Omega 3-6-9 Fatty Acids (OMEGA 3-6-9 COMPLEX PO) Take 1 capsule by mouth daily.    Marland Kitchen OVER THE COUNTER MEDICATION      No current facility-administered medications for this visit.    Facility-Administered Medications Ordered in Other Visits  Medication Dose Route Frequency Provider Last Rate Last Dose  . nivolumab (OPDIVO) 240 mg in sodium chloride 0.9 % 100 mL chemo infusion  240 mg Intravenous Once Curt Bears, MD 248 mL/hr at 08/01/19 1427 240 mg at 08/01/19 1427    SURGICAL HISTORY:  Past Surgical History:  Procedure Laterality Date  . APPLICATION OF CRANIAL NAVIGATION Left 04/29/2018   Procedure: APPLICATION OF CRANIAL NAVIGATION;  Surgeon: Earnie Larsson, MD;  Location: [facility];  Service: Neurosurgery;  Laterality: Left;  . CRANIOTOMY Left 04/29/2018   Procedure: LEFT CRANIOTOMY FOR  TUMOR BRAIN LAB;  Surgeon: Earnie Larsson, MD;  Location: [facility];  Service: Neurosurgery;  Laterality: Left;    REVIEW OF SYSTEMS:   Review of Systems  Constitutional: Negative for appetite change, chills, fatigue, fever and unexpected weight change.  HENT:   Negative for mouth sores,  nosebleeds, sore throat and trouble swallowing.   Eyes: Negative for eye problems and icterus.  Respiratory: Negative for cough, hemoptysis, shortness of breath and wheezing.   Cardiovascular: Negative for chest pain and leg swelling.  Gastrointestinal: Negative for abdominal pain, constipation, diarrhea, nausea and vomiting.  Genitourinary: Negative for bladder incontinence, difficulty urinating, dysuria, frequency and hematuria.   Musculoskeletal: Negative for back pain, gait problem, neck pain and neck stiffness.  Skin: Negative for itching and rash.  Neurological: Negative for dizziness, extremity weakness, gait problem, headaches, light-headedness and seizures.  Hematological: Negative for adenopathy. Does not bruise/bleed easily.  Psychiatric/Behavioral: Negative for confusion, depression and sleep disturbance. The patient is not nervous/anxious.     PHYSICAL EXAMINATION:  Blood pressure 121/74, pulse 80, temperature 98.3 F (36.8 C), temperature source Oral, resp. rate 18, height 5' 9"  (1.753 m), weight 150 lb (68 kg), SpO2 99 %, unknown if currently breastfeeding.  ECOG PERFORMANCE STATUS: 0 - Asymptomatic  Physical Exam  Constitutional: Oriented to person, place, and time and well-developed, well-nourished, and in  no distress.  HENT:  Head: Normocephalic and atraumatic.  Mouth/Throat: Oropharynx is clear and moist. No oropharyngeal exudate.  Eyes: Conjunctivae are normal. Right eye exhibits no discharge. Left eye exhibits no discharge. No scleral icterus.  Neck: Normal range of motion. Neck supple.  Cardiovascular: Normal rate, regular rhythm, normal heart sounds and intact distal pulses.   Pulmonary/Chest: Effort normal and breath sounds normal. No respiratory distress. No wheezes. No rales.  Abdominal: Soft. Bowel sounds are normal. Exhibits no distension and no mass. There is no tenderness.  Musculoskeletal: Normal range of motion. Exhibits no edema.  Lymphadenopathy:    No  cervical adenopathy.  Neurological: Alert and oriented to person, place, and time. Exhibits normal muscle tone. Gait normal. Coordination normal.  Skin: Skin is warm and dry. No rash noted. Not diaphoretic. No erythema. No pallor.  Psychiatric: Mood, memory and judgment normal.  Vitals reviewed.  LABORATORY DATA: Lab Results  Component Value Date   WBC 3.7 (L) 08/01/2019   HGB 13.6 08/01/2019   HCT 40.9 08/01/2019   MCV 92.3 08/01/2019   PLT 257 08/01/2019      Chemistry      Component Value Date/Time   NA 140 08/01/2019 1158   K 4.2 08/01/2019 1158   CL 105 08/01/2019 1158   CO2 25 08/01/2019 1158   BUN 14 08/01/2019 1158   CREATININE 1.07 (H) 08/01/2019 1158      Component Value Date/Time   CALCIUM 8.9 08/01/2019 1158   ALKPHOS 89 08/01/2019 1158   AST 44 (H) 08/01/2019 1158   ALT 39 08/01/2019 1158   BILITOT 0.4 08/01/2019 1158       RADIOGRAPHIC STUDIES:  No results found.   ASSESSMENT/PLAN:  This is a very pleasant 43 year old Caucasian female who presented with a large mass in theleftupper/lower lobe and mediastinal adenopathy as well as a solitary brain metastasis highly suspicious for malignant melanoma. The final pathology report from [facility] showed that her malignancy was consistent with metastatic melanoma.  She is status post a left temporal craniotomy and resection of the tumor on 04/29/2018. The patient also completed stereotactic radiotherapy to the resection cavity under the care of Dr. Lisbeth Renshaw. She previously underwent treatment with immunotherapy with Keytruda 200 mg IV every 3 weeks. She is status post 3 cycles. CT scanafter cycle #3 showed enlargement of the left upper lobe lung mass. This was suspicious for pseudo-progression on immunotherapy. The patient was started on palliative course of radiotherapy to the left upper lobe mass and she tolerated this treatment fairly well. She was seen by Dr. Gavin Potters  and she recommended for the patient to resume her treatment with [facility] until she undergoes further molecular studies. The patient was treated with 2 more cycles of Keytruda and tolerated the treatment well. The patient then had a CT scan performed which showed pericardial invasion. Therefore, the patient is not a good candidate for surgical resection.   From her final pathology report and recommendations from [facility], the patient was switched to immunotherapy withipilimumaband nivolumab. She is status post 2 cycles. Treatment was on hold for more than 6 weeks secondary to grade 3 hepatic dysfunction. She had improvement of her liver enzymes after a prolonged treatment with a tapering scheduleof steroids.  She recently resumed treatment and is currently undergoing single agent nivolumab 1 mg/kg IV every 2 weeks. She is status post 12 cycles with single agentnivolumab 240 mg IV every 2 weeks. She has beentoleratingtreatment well.  The patient was seen with  Dr. Julien Nordmann today.  Labs were reviewed with the patient.  We recommend that she proceed with cycle #13 today as scheduled of single agent nivolumab.   We will see her back for a follow up in 2 weeks for evaluation before starting cycle #14 of single agent nivolumab.  The patient was advised to call immediately if she has any concerning symptoms in the interval. The patient voices understanding of current disease status and treatment options and is in agreement with the current care plan. All questions were answered. The patient knows to call the clinic with any problems, questions or concerns. We can certainly see the patient much sooner if necessary   Orders Placed This Encounter  Procedures  . CMP ([facility] only)    Standing Status:   Standing    Number of Occurrences:   12    Standing Expiration Date:   07/31/2020  . CBC with Differential (Cancer Center Only)    Standing Status:   Standing    Number of  Occurrences:   12    Standing Expiration Date:   07/31/2020  . TSH    Standing Status:   Standing    Number of Occurrences:   6    Standing Expiration Date:   07/31/2020     Tobe Sos Klair Leising, PA-C 08/01/19  ADDENDUM: Hematology/Oncology Attending: I had a face-to-face encounter with the patient today.  I recommended her care plan.  This is a very pleasant 43 years old white female with metastatic malignant melanoma with brain metastasis status post craniotomy with resection of metastatic brain lesion.  The patient is currently undergoing treatment with immunotherapy with nivolumab 240 mg IV every 2 weeks status post 12 cycles.  She has been tolerating this treatment well with no concerning adverse effects. She was seen recently for a second opinion by Dr. Clance Boll at [facility] and he recommended for her to continue with the current treatment plan. I recommended for her to proceed with cycle #13 today as planned. We will see her back for follow-up visit in 2 weeks for evaluation before the next cycle of her treatment. She was advised to call immediately if she has any concerning symptoms in the interval.  Disclaimer: This note was dictated with voice recognition software. Similar sounding words can inadvertently be transcribed and may be missed upon review. Eilleen Kempf, MD 08/01/19

## 2019-08-01 NOTE — Patient Instructions (Signed)
[facility] Discharge Instructions for Patients Receiving Chemotherapy  Today you received the following Immunotherapy: Opdivo  To help prevent nausea and vomiting after your treatment, we encourage you to take your nausea medication as directed by your MD.   If you develop nausea and vomiting that is not controlled by your nausea medication, call the clinic.   BELOW ARE SYMPTOMS THAT SHOULD BE REPORTED IMMEDIATELY:  *FEVER GREATER THAN 100.5 F  *CHILLS WITH OR WITHOUT FEVER  NAUSEA AND VOMITING THAT IS NOT CONTROLLED WITH YOUR NAUSEA MEDICATION  *UNUSUAL SHORTNESS OF BREATH  *UNUSUAL BRUISING OR BLEEDING  TENDERNESS IN MOUTH AND THROAT WITH OR WITHOUT PRESENCE OF ULCERS  *URINARY PROBLEMS  *BOWEL PROBLEMS  UNUSUAL RASH Items with * indicate a potential emergency and should be followed up as soon as possible.  Feel free to call the clinic should you have any questions or concerns. The clinic phone number is [phone].  Please show the [facility] at check-in to the Emergency Department and triage nurse. Coronavirus (COVID-19) Are you at risk?  Are you at risk for the Coronavirus (COVID-19)?  To be considered HIGH RISK for Coronavirus (COVID-19), you have to meet the following criteria:  . Traveled to [place], [place], [place], [place] or [place]; or in the [place] to [place], [place], [place], or [place]; and have fever, cough, and shortness of breath within the last 2 weeks of travel OR . Been in close contact with a person diagnosed with COVID-19 within the last 2 weeks and have fever, cough, and shortness of breath . IF YOU DO NOT MEET THESE CRITERIA, YOU ARE CONSIDERED LOW RISK FOR COVID-19.  What to do if you are HIGH RISK for COVID-19?  Marland Kitchen If you are having a medical emergency, call 911. . Seek medical care right away. Before you go to a doctor's office, urgent care or emergency department, call ahead and tell them about your  recent travel, contact with someone diagnosed with COVID-19, and your symptoms. You should receive instructions from your physician's office regarding next steps of care.  . When you arrive at healthcare provider, tell the healthcare staff immediately you have returned from visiting [place], [place], [place], [place] or [place]; or traveled in the [place] to [place], [place], [place], or [place]; in the last two weeks or you have been in close contact with a person diagnosed with COVID-19 in the last 2 weeks.   . Tell the health care staff about your symptoms: fever, cough and shortness of breath. . After you have been seen by a medical provider, you will be either: o Tested for (COVID-19) and discharged home on quarantine except to seek medical care if symptoms worsen, and asked to  - Stay home and avoid contact with others until you get your results (4-5 days)  - Avoid travel on public transportation if possible (such as bus, train, or airplane) or o Sent to the Emergency Department by EMS for evaluation, COVID-19 testing, and possible admission depending on your condition and test results.  What to do if you are LOW RISK for COVID-19?  Reduce your risk of any infection by using the same precautions used for avoiding the common cold or flu:  Marland Kitchen Wash your hands often with soap and warm water for at least 20 seconds.  If soap and water are not readily available, use an alcohol-based hand sanitizer with at least 60% alcohol.  . If coughing or  sneezing, cover your mouth and nose by coughing or sneezing into the elbow areas of your shirt or coat, into a tissue or into your sleeve (not your hands). . Avoid shaking hands with others and consider head nods or verbal greetings only. . Avoid touching your eyes, nose, or mouth with unwashed hands.  . Avoid close contact with people who are sick. . Avoid places or events with large numbers of people in one location, like concerts or sporting  events. . Carefully consider travel plans you have or are making. . If you are planning any travel outside or inside the [place], visit the CDC's Travelers' Health webpage for the latest health notices. . If you have some symptoms but not all symptoms, continue to monitor at home and seek medical attention if your symptoms worsen. . If you are having a medical emergency, call 911.   [place] / e-Visit: [email or web address]         [facility]: [facility]: [phone]                   [facility]: [phone]

## 2019-08-01 NOTE — Telephone Encounter (Signed)
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 She does not care if her scans are used if it can  help anyone.Emailed pt -we cannot share scans . Recommended she make a  diagram.

## 2019-08-02 ENCOUNTER — Encounter: Payer: Self-pay | Admitting: Medical Oncology

## 2019-08-03 ENCOUNTER — Telehealth: Payer: Self-pay | Admitting: Internal Medicine

## 2019-08-03 NOTE — Telephone Encounter (Signed)
Scheduled appt per 8/18 los - pt aware of next appt date and time on 9/1

## 2019-08-14 DIAGNOSIS — C7931 Secondary malignant neoplasm of brain: Secondary | ICD-10-CM | POA: Diagnosis not present

## 2019-08-14 DIAGNOSIS — C439 Malignant melanoma of skin, unspecified: Secondary | ICD-10-CM | POA: Diagnosis not present

## 2019-08-15 ENCOUNTER — Inpatient Hospital Stay: Payer: BC Managed Care – PPO

## 2019-08-15 ENCOUNTER — Encounter: Payer: Self-pay | Admitting: Internal Medicine

## 2019-08-15 ENCOUNTER — Inpatient Hospital Stay (HOSPITAL_BASED_OUTPATIENT_CLINIC_OR_DEPARTMENT_OTHER): Payer: BC Managed Care – PPO | Admitting: Internal Medicine

## 2019-08-15 ENCOUNTER — Inpatient Hospital Stay: Payer: BC Managed Care – PPO | Attending: Internal Medicine

## 2019-08-15 ENCOUNTER — Other Ambulatory Visit: Payer: BC Managed Care – PPO

## 2019-08-15 ENCOUNTER — Other Ambulatory Visit: Payer: Self-pay

## 2019-08-15 VITALS — BP 118/70 | HR 73 | Temp 98.0°F | Resp 18 | Ht 69.0 in | Wt 149.6 lb

## 2019-08-15 DIAGNOSIS — C7931 Secondary malignant neoplasm of brain: Secondary | ICD-10-CM

## 2019-08-15 DIAGNOSIS — C7802 Secondary malignant neoplasm of left lung: Secondary | ICD-10-CM | POA: Diagnosis not present

## 2019-08-15 DIAGNOSIS — C7951 Secondary malignant neoplasm of bone: Secondary | ICD-10-CM | POA: Insufficient documentation

## 2019-08-15 DIAGNOSIS — Z923 Personal history of irradiation: Secondary | ICD-10-CM | POA: Diagnosis not present

## 2019-08-15 DIAGNOSIS — Z5112 Encounter for antineoplastic immunotherapy: Secondary | ICD-10-CM

## 2019-08-15 DIAGNOSIS — C439 Malignant melanoma of skin, unspecified: Secondary | ICD-10-CM | POA: Diagnosis not present

## 2019-08-15 DIAGNOSIS — C3432 Malignant neoplasm of lower lobe, left bronchus or lung: Secondary | ICD-10-CM

## 2019-08-15 LAB — CBC WITH DIFFERENTIAL (CANCER CENTER ONLY)
Abs Immature Granulocytes: 0 10*3/uL (ref 0.00–0.07)
Basophils Absolute: 0 10*3/uL (ref 0.0–0.1)
Basophils Relative: 1 %
Eosinophils Absolute: 0.1 10*3/uL (ref 0.0–0.5)
Eosinophils Relative: 2 %
HCT: 41.6 % (ref 36.0–46.0)
Hemoglobin: 14.1 g/dL (ref 12.0–15.0)
Immature Granulocytes: 0 %
Lymphocytes Relative: 16 %
Lymphs Abs: 0.5 10*3/uL — ABNORMAL LOW (ref 0.7–4.0)
MCH: 30.6 pg (ref 26.0–34.0)
MCHC: 33.9 g/dL (ref 30.0–36.0)
MCV: 90.2 fL (ref 80.0–100.0)
Monocytes Absolute: 0.5 10*3/uL (ref 0.1–1.0)
Monocytes Relative: 15 %
Neutro Abs: 2.1 10*3/uL (ref 1.7–7.7)
Neutrophils Relative %: 66 %
Platelet Count: 233 10*3/uL (ref 150–400)
RBC: 4.61 MIL/uL (ref 3.87–5.11)
RDW: 13.2 % (ref 11.5–15.5)
WBC Count: 3.1 10*3/uL — ABNORMAL LOW (ref 4.0–10.5)
nRBC: 0 % (ref 0.0–0.2)

## 2019-08-15 LAB — CMP (CANCER CENTER ONLY)
ALT: 33 U/L (ref 0–44)
AST: 37 U/L (ref 15–41)
Albumin: 4 g/dL (ref 3.5–5.0)
Alkaline Phosphatase: 77 U/L (ref 38–126)
Anion gap: 7 (ref 5–15)
BUN: 13 mg/dL (ref 6–20)
CO2: 24 mmol/L (ref 22–32)
Calcium: 9.2 mg/dL (ref 8.9–10.3)
Chloride: 108 mmol/L (ref 98–111)
Creatinine: 0.93 mg/dL (ref 0.44–1.00)
GFR, Est AFR Am: >60 mL/min (ref >60)
GFR, Estimated: >60 mL/min (ref >60)
Glucose, Bld: 90 mg/dL (ref 70–99)
Potassium: 4.1 mmol/L (ref 3.5–5.1)
Sodium: 139 mmol/L (ref 135–145)
Total Bilirubin: 0.5 mg/dL (ref 0.3–1.2)
Total Protein: 6.7 g/dL (ref 6.5–8.1)

## 2019-08-15 LAB — TSH: TSH: 2.608 u[IU]/mL (ref 0.308–3.960)

## 2019-08-15 MED ORDER — SODIUM CHLORIDE 0.9 % IV SOLN
Freq: Once | INTRAVENOUS | Status: AC
  Administered 2019-08-15: 11:00:00 via INTRAVENOUS
  Filled 2019-08-15: qty 250

## 2019-08-15 MED ORDER — SODIUM CHLORIDE 0.9 % IV SOLN
240.0000 mg | Freq: Once | INTRAVENOUS | Status: AC
  Administered 2019-08-15: 11:00:00 240 mg via INTRAVENOUS
  Filled 2019-08-15: qty 24

## 2019-08-15 NOTE — Patient Instructions (Signed)
Nivolumab injection What is this medicine? NIVOLUMAB (nye VOL ue mab) is a monoclonal antibody. It is used to treat melanoma, lung cancer, kidney cancer, head and neck cancer, Hodgkin lymphoma, urothelial cancer, colon cancer, and liver cancer. This medicine may be used for other purposes; ask your health care provider or pharmacist if you have questions. COMMON BRAND NAME(S): Opdivo What should I tell my health care provider before I take this medicine? They need to know if you have any of these conditions:  diabetes  immune system problems  kidney disease  liver disease  lung disease  organ transplant  stomach or intestine problems  thyroid disease  an unusual or allergic reaction to nivolumab, other medicines, foods, dyes, or preservatives  pregnant or trying to get pregnant  breast-feeding How should I use this medicine? This medicine is for infusion into a vein. It is given by a health care professional in a hospital or clinic setting. A special MedGuide will be given to you before each treatment. Be sure to read this information carefully each time. Talk to your pediatrician regarding the use of this medicine in children. While this drug may be prescribed for children as young as 12 years for selected conditions, precautions do apply. Overdosage: If you think you have taken too much of this medicine contact a poison control center or emergency room at once. NOTE: This medicine is only for you. Do not share this medicine with others. What if I miss a dose? It is important not to miss your dose. Call your doctor or health care professional if you are unable to keep an appointment. What may interact with this medicine? Interactions have not been studied. Give your health care provider a list of all the medicines, herbs, non-prescription drugs, or dietary supplements you use. Also tell them if you smoke, drink alcohol, or use illegal drugs. Some items may interact with your  medicine. This list may not describe all possible interactions. Give your health care provider a list of all the medicines, herbs, non-prescription drugs, or dietary supplements you use. Also tell them if you smoke, drink alcohol, or use illegal drugs. Some items may interact with your medicine. What should I watch for while using this medicine? This drug may make you feel generally unwell. Continue your course of treatment even though you feel ill unless your doctor tells you to stop. You may need blood work done while you are taking this medicine. Do not become pregnant while taking this medicine or for 5 months after stopping it. Women should inform their doctor if they wish to become pregnant or think they might be pregnant. There is a potential for serious side effects to an unborn child. Talk to your health care professional or pharmacist for more information. Do not breast-feed an infant while taking this medicine or for 5 months after stopping it. What side effects may I notice from receiving this medicine? Side effects that you should report to your doctor or health care professional as soon as possible:  allergic reactions like skin rash, itching or hives, swelling of the face, lips, or tongue  breathing problems  blood in the urine  bloody or watery diarrhea or black, tarry stools  changes in emotions or moods  changes in vision  chest pain  cough  dizziness  feeling faint or lightheaded, falls  fever, chills  headache with fever, neck stiffness, confusion, loss of memory, sensitivity to light, hallucination, loss of contact with reality, or seizures  joint  pain  mouth sores  redness, blistering, peeling or loosening of the skin, including inside the mouth  severe muscle pain or weakness  signs and symptoms of high blood sugar such as dizziness; dry mouth; dry skin; fruity breath; nausea; stomach pain; increased hunger or thirst; increased urination  signs and  symptoms of kidney injury like trouble passing urine or change in the amount of urine  signs and symptoms of liver injury like dark yellow or brown urine; general ill feeling or flu-like symptoms; light-colored stools; loss of appetite; nausea; right upper belly pain; unusually weak or tired; yellowing of the eyes or skin  swelling of the ankles, feet, hands  trouble passing urine or change in the amount of urine  unusually weak or tired  weight gain or loss Side effects that usually do not require medical attention (report to your doctor or health care professional if they continue or are bothersome):  bone pain  constipation  decreased appetite  diarrhea  muscle pain  nausea, vomiting  tiredness This list may not describe all possible side effects. Call your doctor for medical advice about side effects. You may report side effects to FDA at 1-800-FDA-1088. Where should I keep my medicine? This drug is given in a hospital or clinic and will not be stored at home. NOTE: This sheet is a summary. It may not cover all possible information. If you have questions about this medicine, talk to your doctor, pharmacist, or health care provider.  2020 Elsevier/Gold Standard (2018-04-20 12:55:04)  Coronavirus (COVID-19) Are you at risk?  Are you at risk for the Coronavirus (COVID-19)?  To be considered HIGH RISK for Coronavirus (COVID-19), you have to meet the following criteria:  . Traveled to [place], [place], [place], [place] or [place]; or in the [place] to [place], [place], [place], or [place]; and have fever, cough, and shortness of breath within the last 2 weeks of travel OR . Been in close contact with a person diagnosed with COVID-19 within the last 2 weeks and have fever, cough, and shortness of breath . IF YOU DO NOT MEET THESE CRITERIA, YOU ARE CONSIDERED LOW RISK FOR COVID-19.  What to do if you are HIGH RISK for COVID-19?  Marland Kitchen If you are having a medical  emergency, call 911. . Seek medical care right away. Before you go to a doctor's office, urgent care or emergency department, call ahead and tell them about your recent travel, contact with someone diagnosed with COVID-19, and your symptoms. You should receive instructions from your physician's office regarding next steps of care.  . When you arrive at healthcare provider, tell the healthcare staff immediately you have returned from visiting [place], [place], [place], [place] or [place]; or traveled in the [place] to [place], [place], [place], or [place]; in the last two weeks or you have been in close contact with a person diagnosed with COVID-19 in the last 2 weeks.   . Tell the health care staff about your symptoms: fever, cough and shortness of breath. . After you have been seen by a medical provider, you will be either: o Tested for (COVID-19) and discharged home on quarantine except to seek medical care if symptoms worsen, and asked to  - Stay home and avoid contact with others until you get your results (4-5 days)  - Avoid travel on public transportation if possible (such as bus, train, or airplane) or o Sent to the Emergency Department by EMS for evaluation,  COVID-19 testing, and possible admission depending on your condition and test results.  What to do if you are LOW RISK for COVID-19?  Reduce your risk of any infection by using the same precautions used for avoiding the common cold or flu:  Marland Kitchen Wash your hands often with soap and warm water for at least 20 seconds.  If soap and water are not readily available, use an alcohol-based hand sanitizer with at least 60% alcohol.  . If coughing or sneezing, cover your mouth and nose by coughing or sneezing into the elbow areas of your shirt or coat, into a tissue or into your sleeve (not your hands). . Avoid shaking hands with others and consider head nods or verbal greetings only. . Avoid touching your eyes, nose, or mouth with  unwashed hands.  . Avoid close contact with people who are sick. . Avoid places or events with large numbers of people in one location, like concerts or sporting events. . Carefully consider travel plans you have or are making. . If you are planning any travel outside or inside the [place], visit the CDC's Travelers' Health webpage for the latest health notices. . If you have some symptoms but not all symptoms, continue to monitor at home and seek medical attention if your symptoms worsen. . If you are having a medical emergency, call 911.   [place] / e-Visit: [email or web address]         [facility]: [facility]: [phone]                   [facility]: [phone]

## 2019-08-15 NOTE — Progress Notes (Signed)
[facility] Telephone:([phone]   Fax:([phone]) [phone]  OFFICE PROGRESS NOTE  System, Pcp Not In No address on file  DIAGNOSIS: Metastatic high-grade neoplasm consistent with metastatic malignant melanoma based on the most recent pathology report from [facility] in October 2019, presented with large left left upper/left lower lobe mass with a hypermetabolic AP window lymph node as well as solitary metastatic brain lesion diagnosed in May 2019.  Biomarker Findings Microsatellite status - MS-Stable Tumor Mutational Burden - TMB-Intermediate (16 Muts/Mb) Genomic Findings For a complete list of the genes assayed, please refer to the Appendix. CD274 (PD-L1) amplification NRAS Q61R PDCD1LG2 (PD-L2) amplification MYC amplification EPHB1 amplification JAK2 amplification RB1 Q93*, P295* TERT promoter -146C>T TP53 C275W   PRIOR THERAPY: 1) left temporal craniotomy with resection of tumor with intraoperative stereotactic guidance for volumetric resection under the care of Dr. Annette Stable on 04/29/2018. 2) First-line treatment with immunotherapy with Keytruda 200 mg IV every 3 weeks.  First dose June 02, 2018.  Status post 3 cycles.  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 3) palliative radiotherapy to the left lung mass under the care of Dr. Lisbeth Renshaw completed on 08/24/2018. 4) Resuming her treatment again with Keytruda 200 mg IV every 3 weeks, first dose 09/08/2018.  Status post 2 cycles.  CURRENT THERAPY: Treatment with immunotherapy with ipilimumab 3 mg/KG and nivolumab 1 mg/KG every 3 weeks for the first 4 cycles followed by maintenance nivolumab 240 mg IV every 2 weeks.  First dose of this treatment October 26, 2018.  Status post 15 cycles.  Ipilimumab was discontinued after cycle #2 for the significant liver dysfunction.  Starting from cycle #3 the patient is on treatment with single agent nivolumab.  INTERVAL HISTORY:  Leslie [name] 43 y.o. female returns to the clinic today for follow-up visit.  The patient is feeling fine today with no concerning complaints except for intermittent sensation of electric sensation on the left side of the chest.  She denied having any associated shortness of breath, cough or hemoptysis.  She denied having any fever or chills.  She continues to have occasional diarrhea but this is well controlled with her current medication.  She denied having any fever or chills.  She has no significant weight loss or night sweats.  She has no headache or visual changes.  She continues to tolerate her treatment with nivolumab fairly well.  She is here for evaluation before starting cycle #16.  MEDICAL HISTORY: Past Medical History:  Diagnosis Date  . 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 Migraines   . Yeast infection     ALLERGIES:  is allergic to diflucan [fluconazole].  MEDICATIONS:  Current Outpatient Medications  Medication Sig Dispense Refill  . AMERICAN GINSENG PO Take 1,500-3,000 mg by mouth daily. Depending on level of fatigue    . Amino Acids (L-CARNITINE) LIQD Take 2,000 mg by mouth daily. Liquid L-Carnitine 2000 mg    . Ascorbic Acid (LIQUID C 500) 500 MG/15ML LIQD Take 1,000 mg by mouth daily.    . Beta Glucan POWD 1 Dose by Does not apply route daily. Beta D Glucan 400 mg    . Calcium-Magnesium (CAL-MAG PO) Take 15 mLs by mouth daily. Marine Based Cal - Mag (500 mg Calcium / 250 mg Mag) 15 ml total    . Coenzyme Q10 (COQ-10) 100 MG CAPS Take 1 tablet by mouth daily.    Marland Kitchen CREATINE MONOHYDRATE PO  Take 6 g by mouth daily. Creatine MonoHydrate 6 g (on training cycle)    . doxylamine, Sleep, (UNISOM) 25 MG tablet Take 12.5 mg by mouth at bedtime as needed for sleep.    . Glucosamine-Chondroit-Vit C-Mn (GLUCOSAMINE 1500 COMPLEX PO) Take 1 tablet by mouth daily. Vegan CMO Glucosamine    . Hyaluronic Acid-Vitamin C (HYALURONIC ACID PO) Take 100 mg by  mouth daily. Liquid Hyaluronic Acid 100 mg    . Iron-Folic Acid-Vit B84 (IRON FORMULA PO) Take 1 Dose by mouth daily. Iron 50 mg (1/2 before workout and 1/2 afterwards - cycle 1 wk off and on)    . Lysine POWD 1,600 mg by Does not apply route daily. L-Lysine Powder 1600 mg    . Methylsulfonylmethane (MSM) 1000 MG CAPS Take 4 capsules by mouth daily. 4000 mg (1/2 before workout and 1/2 afterwards)    . Multiple Vitamin (MULTIVITAMIN) tablet Take 1 tablet by mouth daily.    . Multiple Vitamins-Minerals (ZINC PO) Take 1 tablet by mouth daily. Raw Zinc 30 mg (cycle every other week)    . NON FORMULARY Take 1 Dose by mouth daily. Cordyceps 750 mg    . NON FORMULARY Take 1 Dose by mouth daily. Liver Herbal Supplement 675 mg    . NON FORMULARY Take 1 Dose by mouth daily. Reishi Mushroom 500 mg    . NON FORMULARY Take 1 Dose by mouth daily. Curcumin Phytosome (Bioperrine) 3000 mg    . NON FORMULARY Take 1 tablet by mouth daily. Artemisnin 200 mg    . NON FORMULARY Take 1 tablet by mouth daily. Wellness Herbal Tablet as needed (Elderberry, Ashwagandha, etc)    . NON FORMULARY Take 1 tablet by mouth daily. Zinc L Carnosine Complex 75/59 mg (after workout)    . Omega 3-6-9 Fatty Acids (OMEGA-3-6-9 PO) Take 2 capsules by mouth daily. Plant Based Omega 3-6-9 2 capsules = 1825 mg    . QUERCETIN PO Take 1 tablet by mouth daily. Quercetin 400 mg / Bromelain 85 mg    . RESVERATROL PO Take 1,200 mg by mouth daily. Trans Resveratrol    . VITAMIN A PO Take 1 tablet by mouth daily. 1500 mcg (5000 IU)    . Vitamin D-Vitamin K (VITAMIN K2-VITAMIN D3 PO) Take 1 capsule by mouth daily. 5000 IU + 50 mcg    . VITAMIN E PO Take 1 tablet by mouth daily. Vegan Vitamin E     No current facility-administered medications for this visit.     SURGICAL HISTORY:  Past Surgical History:  Procedure Laterality Date  . APPLICATION OF CRANIAL NAVIGATION Left 04/29/2018   Procedure: APPLICATION OF CRANIAL NAVIGATION;  Surgeon: Earnie Larsson, MD;  Location: [facility];  Service: Neurosurgery;  Laterality: Left;  . CRANIOTOMY Left 04/29/2018   Procedure: LEFT CRANIOTOMY FOR  TUMOR BRAIN LAB;  Surgeon: Earnie Larsson, MD;  Location: [facility];  Service: Neurosurgery;  Laterality: Left;    REVIEW OF SYSTEMS:  A comprehensive review of systems was negative except for: Respiratory: positive for pleurisy/chest pain Gastrointestinal: positive for diarrhea   PHYSICAL EXAMINATION: General appearance: alert, cooperative and no distress Head: Normocephalic, without obvious abnormality, atraumatic Neck: no adenopathy, no JVD, supple, symmetrical, trachea midline and thyroid not enlarged, symmetric, no tenderness/mass/nodules Lymph nodes: Cervical, supraclavicular, and axillary nodes normal. Resp: clear to auscultation bilaterally Back: negative, symmetric, no curvature. ROM normal. No CVA tenderness. Cardio: regular rate and rhythm, S1, S2 normal, no murmur, click, rub or gallop GI: soft,  non-tender; bowel sounds normal; no masses,  no organomegaly Extremities: extremities normal, atraumatic, no cyanosis or edema  ECOG PERFORMANCE STATUS: 1 - Symptomatic but completely ambulatory  Blood pressure 118/70, pulse 73, temperature 98 F (36.7 C), temperature source Oral, resp. rate 18, height _0  (1.753 m), weight 149 lb 9.6 oz (67.9 kg), SpO2 100 %, unknown if currently breastfeeding.  LABORATORY DATA: Lab Results  Component Value Date   WBC 3.1 (L) 08/15/2019   HGB 14.1 08/15/2019   HCT 41.6 08/15/2019   MCV 90.2 08/15/2019   PLT 233 08/15/2019      Chemistry      Component Value Date/Time   NA 140 08/01/2019 1158   K 4.2 08/01/2019 1158   CL 105 08/01/2019 1158   CO2 25 08/01/2019 1158   BUN 14 08/01/2019 1158   CREATININE 1.07 (H) 08/01/2019 1158      Component Value Date/Time   CALCIUM 8.9 08/01/2019 1158   ALKPHOS 89 08/01/2019 1158   AST 44 (H) 08/01/2019 1158   ALT 39 08/01/2019 1158   BILITOT 0.4 08/01/2019 1158        RADIOGRAPHIC STUDIES: No results found.  ASSESSMENT AND PLAN: This is a very pleasant 43 years old white female with highly suspicious metastatic malignant melanoma presented with large mass in the left upper/left lower lobe and mediastinal lymphadenopathy as well as solitary brain metastasis status post left temporal craniotomy and resection of tumor on 04/29/2018 and she is recovering well from her surgery. The patient completed stereotactic radiotherapy to the resection cavity next week under the care of Dr. Lisbeth Renshaw. The patient underwent treatment with immunotherapy with Keytruda 200 mg IV every 3 weeks status post 3 cycles.   Her scan after cycle #3 showed enlargement of the left upper lobe lung mass.  This was also suspicious for pseudo-progression on immunotherapy.  The patient was started on a palliative course of radiotherapy to the left upper lobe lung mass and she tolerated this treatment fairly well.  She was seen by Dr. Emelda Brothers at [facility] and she recommended for the patient to resume her treatment with [facility] for now until she undergoes further molecular studies.  The patient was treated with 2 more cycles of Keytruda and tolerated the treatment well. Her recent CT scan of the chest, abdomen and pelvis showed some improvement in the left upper lobe lung mass but there is still concern about pericardial invasion. I personally and independently reviewed the scan images and discussed the result and showed the images to the patient and her boyfriend today.  She is still not a good candidate for surgical resection because of the pericardial invasion. The final pathology report and recommendation from [facility] was consistent with metastatic melanoma and the recommendation is to switch the patient to a combination immunotherapy with Ipilumumab and nivolumab. The patient is currently undergoing treatment with immunotherapy with ipilimumab and nivolumab status post 2  cycles.  Her treatment was on hold for more than 6 weeks secondary to grade 3 hepatic dysfunction.  She had improvement of her liver enzyme after a prolonged treatment with a steroid with a tapering schedule. She resumed her treatment again with single agent nivolumab.  She is status post 15 cycles of treatment. The patient has been tolerating this treatment well with no concerning adverse effects. I recommended for her to proceed with cycle #16 today as planned. She will come back for follow-up visit in 2 weeks for evaluation after repeating CT scan of the  chest, abdomen and pelvis for restaging of her disease. The patient was advised to call immediately if she has any concerning symptoms in the interval. The patient voices understanding of current disease status and treatment options and is in agreement with the current care plan. All questions were answered. The patient knows to call the clinic with any problems, questions or concerns. We can certainly see the patient much sooner if necessary.  Disclaimer: This note was dictated with voice recognition software. Similar sounding words can inadvertently be transcribed and may not be corrected upon review.

## 2019-08-17 ENCOUNTER — Telehealth: Payer: Self-pay | Admitting: Internal Medicine

## 2019-08-17 DIAGNOSIS — D225 Melanocytic nevi of trunk: Secondary | ICD-10-CM | POA: Diagnosis not present

## 2019-08-17 DIAGNOSIS — D2271 Melanocytic nevi of right lower limb, including hip: Secondary | ICD-10-CM | POA: Diagnosis not present

## 2019-08-17 DIAGNOSIS — Z808 Family history of malignant neoplasm of other organs or systems: Secondary | ICD-10-CM | POA: Diagnosis not present

## 2019-08-17 DIAGNOSIS — D2262 Melanocytic nevi of left upper limb, including shoulder: Secondary | ICD-10-CM | POA: Diagnosis not present

## 2019-08-17 DIAGNOSIS — D485 Neoplasm of uncertain behavior of skin: Secondary | ICD-10-CM | POA: Diagnosis not present

## 2019-08-17 DIAGNOSIS — D2261 Melanocytic nevi of right upper limb, including shoulder: Secondary | ICD-10-CM | POA: Diagnosis not present

## 2019-08-17 NOTE — Telephone Encounter (Signed)
Scheduled appt per 9/1 los - pt to get an updated schedule next visit.

## 2019-08-23 ENCOUNTER — Ambulatory Visit (HOSPITAL_COMMUNITY)
Admission: RE | Admit: 2019-08-23 | Discharge: 2019-08-23 | Disposition: A | Discharge status: Home / Self Care | Payer: BC Managed Care – PPO | Source: Ambulatory Visit | Attending: Internal Medicine | Admitting: Internal Medicine

## 2019-08-23 ENCOUNTER — Other Ambulatory Visit: Payer: Self-pay

## 2019-08-23 ENCOUNTER — Encounter (HOSPITAL_COMMUNITY): Payer: Self-pay

## 2019-08-23 DIAGNOSIS — C7802 Secondary malignant neoplasm of left lung: Secondary | ICD-10-CM | POA: Diagnosis not present

## 2019-08-23 DIAGNOSIS — R911 Solitary pulmonary nodule: Secondary | ICD-10-CM | POA: Diagnosis not present

## 2019-08-23 DIAGNOSIS — M9985 Other biomechanical lesions of pelvic region: Secondary | ICD-10-CM | POA: Diagnosis not present

## 2019-08-23 DIAGNOSIS — C7931 Secondary malignant neoplasm of brain: Secondary | ICD-10-CM | POA: Diagnosis not present

## 2019-08-23 MED ORDER — SODIUM CHLORIDE (PF) 0.9 % IJ SOLN
INTRAMUSCULAR | Status: AC
  Filled 2019-08-23: qty 50

## 2019-08-23 MED ORDER — IOHEXOL 300 MG/ML  SOLN
100.0000 mL | Freq: Once | INTRAMUSCULAR | Status: AC | PRN
  Administered 2019-08-23: 100 mL via INTRAVENOUS

## 2019-08-24 DIAGNOSIS — M9907 Segmental and somatic dysfunction of upper extremity: Secondary | ICD-10-CM | POA: Diagnosis not present

## 2019-08-24 DIAGNOSIS — M62838 Other muscle spasm: Secondary | ICD-10-CM | POA: Diagnosis not present

## 2019-08-24 DIAGNOSIS — M9903 Segmental and somatic dysfunction of lumbar region: Secondary | ICD-10-CM | POA: Diagnosis not present

## 2019-08-24 DIAGNOSIS — M9901 Segmental and somatic dysfunction of cervical region: Secondary | ICD-10-CM | POA: Diagnosis not present

## 2019-08-24 DIAGNOSIS — M9902 Segmental and somatic dysfunction of thoracic region: Secondary | ICD-10-CM | POA: Diagnosis not present

## 2019-08-24 DIAGNOSIS — M2560 Stiffness of unspecified joint, not elsewhere classified: Secondary | ICD-10-CM | POA: Diagnosis not present

## 2019-08-25 ENCOUNTER — Telehealth: Payer: Self-pay | Admitting: Medical Oncology

## 2019-08-25 NOTE — Telephone Encounter (Signed)
Asking if Leslie [name] got results of CT scan.I called pt and told her that I do not know if he received results. She said she will call back on Monday.

## 2019-08-26 ENCOUNTER — Encounter (HOSPITAL_COMMUNITY): Payer: Self-pay | Admitting: *Deleted

## 2019-08-26 ENCOUNTER — Other Ambulatory Visit: Payer: Self-pay

## 2019-08-26 ENCOUNTER — Emergency Department (HOSPITAL_COMMUNITY)
Admission: EM | Admit: 2019-08-26 | Discharge: 2019-08-26 | Disposition: A | Discharge status: Home / Self Care | Payer: BC Managed Care – PPO | Attending: Emergency Medicine | Admitting: Emergency Medicine

## 2019-08-26 DIAGNOSIS — Z79899 Other long term (current) drug therapy: Secondary | ICD-10-CM | POA: Insufficient documentation

## 2019-08-26 DIAGNOSIS — R1011 Right upper quadrant pain: Secondary | ICD-10-CM | POA: Insufficient documentation

## 2019-08-26 LAB — COMPREHENSIVE METABOLIC PANEL
ALT: 37 U/L (ref 0–44)
AST: 42 U/L — ABNORMAL HIGH (ref 15–41)
Albumin: 4.2 g/dL (ref 3.5–5.0)
Alkaline Phosphatase: 70 U/L (ref 38–126)
Anion gap: 10 (ref 5–15)
BUN: 16 mg/dL (ref 6–20)
CO2: 24 mmol/L (ref 22–32)
Calcium: 9.2 mg/dL (ref 8.9–10.3)
Chloride: 107 mmol/L (ref 98–111)
Creatinine, Ser: 0.86 mg/dL (ref 0.44–1.00)
GFR calc Af Amer: >60 mL/min (ref >60)
GFR calc non Af Amer: >60 mL/min (ref >60)
Glucose, Bld: 89 mg/dL (ref 70–99)
Potassium: 4.1 mmol/L (ref 3.5–5.1)
Sodium: 141 mmol/L (ref 135–145)
Total Bilirubin: 0.4 mg/dL (ref 0.3–1.2)
Total Protein: 6.8 g/dL (ref 6.5–8.1)

## 2019-08-26 LAB — URINALYSIS, ROUTINE W REFLEX MICROSCOPIC
Bilirubin Urine: NEGATIVE
Glucose, UA: NEGATIVE mg/dL
Hgb urine dipstick: NEGATIVE
Ketones, ur: NEGATIVE mg/dL
Leukocytes,Ua: NEGATIVE
Nitrite: NEGATIVE
Protein, ur: NEGATIVE mg/dL
Specific Gravity, Urine: 1.004 — ABNORMAL LOW (ref 1.005–1.030)
pH: 8 (ref 5.0–8.0)

## 2019-08-26 LAB — CBC
HCT: 43.1 % (ref 36.0–46.0)
Hemoglobin: 14.3 g/dL (ref 12.0–15.0)
MCH: 31 pg (ref 26.0–34.0)
MCHC: 33.2 g/dL (ref 30.0–36.0)
MCV: 93.5 fL (ref 80.0–100.0)
Platelets: 238 10*3/uL (ref 150–400)
RBC: 4.61 MIL/uL (ref 3.87–5.11)
RDW: 13.3 % (ref 11.5–15.5)
WBC: 2.8 10*3/uL — ABNORMAL LOW (ref 4.0–10.5)
nRBC: 0 % (ref 0.0–0.2)

## 2019-08-26 LAB — I-STAT BETA HCG BLOOD, ED (MC, WL, AP ONLY): I-stat hCG, quantitative: <5 m[IU]/mL (ref <5)

## 2019-08-26 LAB — LIPASE, BLOOD: Lipase: 46 U/L (ref 11–51)

## 2019-08-26 MED ORDER — ALUM & MAG HYDROXIDE-SIMETH 200-200-20 MG/5ML PO SUSP
30.0000 mL | Freq: Once | ORAL | Status: AC
  Administered 2019-08-26: 30 mL via ORAL
  Filled 2019-08-26: qty 30

## 2019-08-26 MED ORDER — SODIUM CHLORIDE 0.9% FLUSH
3.0000 mL | Freq: Once | INTRAVENOUS | Status: AC
  Administered 2019-08-26: 06:00:00 3 mL via INTRAVENOUS

## 2019-08-26 NOTE — Discharge Instructions (Signed)
Try zantac or pepcid twice a day.  Try to avoid things that may make this worse, most commonly these are spicy foods tomato based products fatty foods chocolate and peppermint.  Alcohol and tobacco can also make this worse.  Return to the emergency department for sudden worsening pain fever or inability to eat or drink.

## 2019-08-26 NOTE — ED Notes (Signed)
Urine culture sent to the lab. 

## 2019-08-26 NOTE — ED Triage Notes (Signed)
Onset of upper abdominal pain this morning around 0200. Reports some chills. Currently receiving immunotherapy for cancer.

## 2019-08-26 NOTE — ED Provider Notes (Signed)
[place] DEPT Provider Note   CSN: [id number] Arrival date & time: 08/26/19  0548     History   Chief Complaint Chief Complaint  Patient presents with  . Abdominal Pain    HPI Leslie [name] is a 43 y.o. female.     43 yo F with chief complaints of right upper quadrant abdominal pain.  Wraps somewhat underneath her right rib margin.  Off and on since last night.  Nothing seems to make it worse.  Improves with ambulation and certain positions.  Never had pain like this before.  The history is provided by the patient.  Abdominal Pain Pain location:  Epigastric and RUQ Pain quality: sharp and shooting   Pain radiates to:  Does not radiate Pain severity:  Moderate Onset quality:  Gradual Duration:  6 hours Timing:  Constant Progression:  Worsening Chronicity:  New Relieved by:  Nothing Worsened by:  Nothing Ineffective treatments:  None tried Associated symptoms: no chest pain, no chills, no dysuria, no fever, no nausea, no shortness of breath and no vomiting     Past Medical History:  Diagnosis Date  . 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 Migraines   . Yeast infection     Patient Active Problem List   Diagnosis Date Noted  . Hepatitis 12/28/2018  . 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 Encounter for antineoplastic immunotherapy 07/14/2018  . Malignant melanoma ([place]) 06/09/2018  . Pulmonary metastasis ([place]) 05/25/2018  . Goals of care, counseling/discussion 05/11/2018  . Malignant neoplasm of lower lobe of left lung ([place]) 05/02/2018  . Brain metastasis ([place]) 05/02/2018  . S/P craniotomy 04/29/2018  . Lung mass 04/27/2018  . Brain mass 04/26/2018    Past Surgical History:  Procedure Laterality Date  . APPLICATION OF CRANIAL NAVIGATION Left 04/29/2018   Procedure: APPLICATION OF CRANIAL NAVIGATION;  Surgeon: Earnie Larsson, MD;  Location: [place];  Service: Neurosurgery;  Laterality: Left;   . CRANIOTOMY Left 04/29/2018   Procedure: LEFT CRANIOTOMY FOR  TUMOR BRAIN LAB;  Surgeon: Earnie Larsson, MD;  Location: [place];  Service: Neurosurgery;  Laterality: Left;     OB History    Gravida  3   Para  2   Term      Preterm      AB      Living  2     SAB      TAB      Ectopic      Multiple      Live Births               Home Medications    Prior to Admission medications   Medication Sig Start Date End Date Taking? Authorizing Provider  AMERICAN GINSENG PO Take 1,500-3,000 mg by mouth daily. Depending on level of fatigue    [provider]  Amino Acids (L-CARNITINE) LIQD Take 2,000 mg by mouth daily. Liquid L-Carnitine 2000 mg    [provider]  Ascorbic Acid (LIQUID C 500) 500 MG/15ML LIQD Take 1,000 mg by mouth daily.    [provider]  Beta Glucan POWD 1 Dose by Does not apply route daily. Beta D Glucan 400 mg    [provider]  Calcium-Magnesium (CAL-MAG PO) Take 15 mLs by mouth daily. Marine Based Cal - Mag (500 mg Calcium / 250 mg Mag) 15 ml total    [provider]  Coenzyme Q10 (COQ-10) 100 MG CAPS  Take 1 tablet by mouth daily.    [provider]  CREATINE MONOHYDRATE PO Take 6 g by mouth daily. Creatine MonoHydrate 6 g (on training cycle)    [provider]  doxylamine, Sleep, (UNISOM) 25 MG tablet Take 12.5 mg by mouth at bedtime as needed for sleep.    [provider]  Glucosamine-Chondroit-Vit C-Mn (GLUCOSAMINE 1500 COMPLEX PO) Take 1 tablet by mouth daily. Vegan CMO Glucosamine    [provider]  Hyaluronic Acid-Vitamin C (HYALURONIC ACID PO) Take 100 mg by mouth daily. Liquid Hyaluronic Acid 100 mg    [provider]  Iron-Folic Acid-Vit E93 (IRON FORMULA PO) Take 1 Dose by mouth daily. Iron 50 mg (1/2 before workout and 1/2 afterwards - cycle 1 wk off and on)    [provider]  Lysine POWD 1,600 mg by Does not apply route daily. L-Lysine Powder 1600  mg    [provider]  Methylsulfonylmethane (MSM) 1000 MG CAPS Take 4 capsules by mouth daily. 4000 mg (1/2 before workout and 1/2 afterwards)    [provider]  Multiple Vitamin (MULTIVITAMIN) tablet Take 1 tablet by mouth daily.    [provider]  Multiple Vitamins-Minerals (ZINC PO) Take 1 tablet by mouth daily. Raw Zinc 30 mg (cycle every other week)    [provider]  NON FORMULARY Take 1 Dose by mouth daily. Cordyceps 750 mg    [provider]  NON FORMULARY Take 1 Dose by mouth daily. Liver Herbal Supplement 675 mg    [provider]  NON FORMULARY Take 1 Dose by mouth daily. Reishi Mushroom 500 mg    [provider]  NON FORMULARY Take 1 Dose by mouth daily. Curcumin Phytosome (Bioperrine) 3000 mg    [provider]  NON FORMULARY Take 1 tablet by mouth daily. Artemisnin 200 mg    [provider]  NON FORMULARY Take 1 tablet by mouth daily. Wellness Herbal Tablet as needed (Elderberry, Ashwagandha, etc)    [provider]  NON FORMULARY Take 1 tablet by mouth daily. Zinc L Carnosine Complex 75/59 mg (after workout)    [provider]  Omega 3-6-9 Fatty Acids (OMEGA-3-6-9 PO) Take 2 capsules by mouth daily. Plant Based Omega 3-6-9 2 capsules = 1825 mg    [provider]  QUERCETIN PO Take 1 tablet by mouth daily. Quercetin 400 mg / Bromelain 85 mg    [provider]  RESVERATROL PO Take 1,200 mg by mouth daily. Trans Resveratrol    [provider]  VITAMIN A PO Take 1 tablet by mouth daily. 1500 mcg (5000 IU)    [provider]  Vitamin D-Vitamin K (VITAMIN K2-VITAMIN D3 PO) Take 1 capsule by mouth daily. 5000 IU + 50 mcg    [provider]  VITAMIN E PO Take 1 tablet by mouth daily. Vegan Vitamin E    [provider]    Family History Family History  Problem Relation Age of Onset  . Cancer Father        lung    Social History  Social History   Tobacco Use  . Smoking status: Never Smoker  . Smokeless tobacco: Never Used  Substance Use Topics  . Alcohol use: Yes  . Drug use: No     Allergies   Diflucan [fluconazole]   Review of Systems Review of Systems  Constitutional: Negative for chills and fever.  HENT: Negative for congestion and rhinorrhea.   Eyes: Negative  for redness and visual disturbance.  Respiratory: Negative for shortness of breath and wheezing.   Cardiovascular: Negative for chest pain and palpitations.  Gastrointestinal: Positive for abdominal pain. Negative for nausea and vomiting.  Genitourinary: Negative for dysuria and urgency.  Musculoskeletal: Negative for arthralgias and myalgias.  Skin: Negative for pallor and wound.  Neurological: Negative for dizziness and headaches.     Physical Exam Updated Vital Signs BP 127/82   Pulse 70   Temp 97.9 F (36.6 C) (Oral)   Resp 16   Ht 5' 9"  (1.753 m)   Wt 67.6 kg   LMP 08/19/2019 (Exact Date)   SpO2 100%   BMI 22.00 kg/m   Physical Exam Vitals signs and nursing note reviewed.  Constitutional:      General: She is not in acute distress.    Appearance: She is well-developed. She is not diaphoretic.  HENT:     Head: Normocephalic and atraumatic.  Eyes:     Pupils: Pupils are equal, round, and reactive to light.  Neck:     Musculoskeletal: Normal range of motion and neck supple.  Cardiovascular:     Rate and Rhythm: Normal rate and regular rhythm.     Heart sounds: No murmur. No friction rub. No gallop.   Pulmonary:     Effort: Pulmonary effort is normal.     Breath sounds: No wheezing or rales.  Abdominal:     General: There is no distension.     Palpations: Abdomen is soft.     Tenderness: There is abdominal tenderness (mild epigastric).  Musculoskeletal:        General: No tenderness.  Skin:    General: Skin is warm and dry.  Neurological:     Mental Status: She is alert and oriented to person, place, and time.   Psychiatric:        Behavior: Behavior normal.      ED Treatments / Results  Labs (all labs ordered are listed, but only abnormal results are displayed) Labs Reviewed  COMPREHENSIVE METABOLIC PANEL - Abnormal; Notable for the following components:      Result Value   AST 42 (*)    All other components within normal limits  CBC - Abnormal; Notable for the following components:   WBC 2.8 (*)    All other components within normal limits  URINALYSIS, ROUTINE W REFLEX MICROSCOPIC - Abnormal; Notable for the following components:   Color, Urine STRAW (*)    Specific Gravity, Urine 1.004 (*)    Bacteria, UA RARE (*)    All other components within normal limits  LIPASE, BLOOD  I-STAT BETA HCG BLOOD, ED ([facility], [facility], [facility] ONLY)    EKG None  Radiology No results found.  Procedures Procedures (including critical care time)  Medications Ordered in ED Medications  sodium chloride flush (NS) 0.9 % injection 3 mL (3 mLs Intravenous Given 08/26/19 0619)  alum & mag hydroxide-simeth (MAALOX/MYLANTA) 200-200-20 MG/5ML suspension 30 mL (30 mLs Oral Given 08/26/19 0731)     Initial Impression / Assessment and Plan / ED Course  I have reviewed the triage vital signs and the nursing notes.  Pertinent labs & imaging results that were available during my care of the patient were reviewed by me and considered in my medical decision making (see chart for details).        43 yo F with a chief complaint of upper abdominal pain.  Her LFTs are unremarkable her lipase is negative.  She is not  pregnant.  She has mild tenderness to the epigastrium.  No pain in the right upper quadrant negative Murphy sign.  I feel that further imaging of the gallbladder would be unlikely to be helpful.  We will treat this as possible reflux.  GI cocktail here in Zantac or Pepcid twice a day for the next week.  The patient had a CT scan of her chest abdomen pelvis to evaluate for her thoracic mass.  She has not yet been  able to discuss this with her oncologist.  I showed her the results here.  Oncology follow-up.  7:49 AM:  I have discussed the diagnosis/risks/treatment options with the patient and family and believe the pt to be eligible for discharge home to follow-up with Oncology. We also discussed returning to the ED immediately if new or worsening sx occur. We discussed the sx which are most concerning (e.g., sudden worsening pain, fever, inability to tolerate by mouth) that necessitate immediate return. Medications administered to the patient during their visit and any new prescriptions provided to the patient are listed below.  Medications given during this visit Medications  sodium chloride flush (NS) 0.9 % injection 3 mL (3 mLs Intravenous Given 08/26/19 0619)  alum & mag hydroxide-simeth (MAALOX/MYLANTA) 200-200-20 MG/5ML suspension 30 mL (30 mLs Oral Given 08/26/19 0731)     The patient appears reasonably screen and/or stabilized for discharge and I doubt any other medical condition or other [facility] requiring further screening, evaluation, or treatment in the ED at this time prior to discharge.    Final Clinical Impressions(s) / ED Diagnoses   Final diagnoses:  RUQ abdominal pain    ED Discharge Orders    None       Deno Etienne, DO 08/26/19 3704

## 2019-08-29 ENCOUNTER — Encounter: Payer: Self-pay | Admitting: Internal Medicine

## 2019-08-29 ENCOUNTER — Other Ambulatory Visit: Payer: Self-pay

## 2019-08-29 ENCOUNTER — Inpatient Hospital Stay (HOSPITAL_BASED_OUTPATIENT_CLINIC_OR_DEPARTMENT_OTHER): Payer: BC Managed Care – PPO | Admitting: Internal Medicine

## 2019-08-29 ENCOUNTER — Inpatient Hospital Stay: Payer: BC Managed Care – PPO

## 2019-08-29 VITALS — BP 123/78 | HR 63 | Temp 97.8°F | Resp 18 | Ht 69.0 in | Wt 149.6 lb

## 2019-08-29 DIAGNOSIS — C7802 Secondary malignant neoplasm of left lung: Secondary | ICD-10-CM

## 2019-08-29 DIAGNOSIS — Z5112 Encounter for antineoplastic immunotherapy: Secondary | ICD-10-CM | POA: Diagnosis not present

## 2019-08-29 DIAGNOSIS — C3432 Malignant neoplasm of lower lobe, left bronchus or lung: Secondary | ICD-10-CM

## 2019-08-29 DIAGNOSIS — C7951 Secondary malignant neoplasm of bone: Secondary | ICD-10-CM | POA: Diagnosis not present

## 2019-08-29 DIAGNOSIS — C439 Malignant melanoma of skin, unspecified: Secondary | ICD-10-CM | POA: Diagnosis not present

## 2019-08-29 DIAGNOSIS — Z923 Personal history of irradiation: Secondary | ICD-10-CM | POA: Diagnosis not present

## 2019-08-29 LAB — CMP (CANCER CENTER ONLY)
ALT: 29 U/L (ref 0–44)
AST: 36 U/L (ref 15–41)
Albumin: 4.3 g/dL (ref 3.5–5.0)
Alkaline Phosphatase: 74 U/L (ref 38–126)
Anion gap: 7 (ref 5–15)
BUN: 14 mg/dL (ref 6–20)
CO2: 25 mmol/L (ref 22–32)
Calcium: 9.1 mg/dL (ref 8.9–10.3)
Chloride: 107 mmol/L (ref 98–111)
Creatinine: 0.88 mg/dL (ref 0.44–1.00)
GFR, Est AFR Am: >60 mL/min (ref >60)
GFR, Estimated: >60 mL/min (ref >60)
Glucose, Bld: 90 mg/dL (ref 70–99)
Potassium: 4 mmol/L (ref 3.5–5.1)
Sodium: 139 mmol/L (ref 135–145)
Total Bilirubin: 0.5 mg/dL (ref 0.3–1.2)
Total Protein: 6.8 g/dL (ref 6.5–8.1)

## 2019-08-29 LAB — CBC WITH DIFFERENTIAL (CANCER CENTER ONLY)
Abs Immature Granulocytes: 0.01 10*3/uL (ref 0.00–0.07)
Basophils Absolute: 0 10*3/uL (ref 0.0–0.1)
Basophils Relative: 1 %
Eosinophils Absolute: 0.1 10*3/uL (ref 0.0–0.5)
Eosinophils Relative: 4 %
HCT: 42.5 % (ref 36.0–46.0)
Hemoglobin: 14.5 g/dL (ref 12.0–15.0)
Immature Granulocytes: 0 %
Lymphocytes Relative: 22 %
Lymphs Abs: 0.7 10*3/uL (ref 0.7–4.0)
MCH: 31 pg (ref 26.0–34.0)
MCHC: 34.1 g/dL (ref 30.0–36.0)
MCV: 91 fL (ref 80.0–100.0)
Monocytes Absolute: 0.6 10*3/uL (ref 0.1–1.0)
Monocytes Relative: 18 %
Neutro Abs: 1.8 10*3/uL (ref 1.7–7.7)
Neutrophils Relative %: 55 %
Platelet Count: 256 10*3/uL (ref 150–400)
RBC: 4.67 MIL/uL (ref 3.87–5.11)
RDW: 13.1 % (ref 11.5–15.5)
WBC Count: 3.3 10*3/uL — ABNORMAL LOW (ref 4.0–10.5)
nRBC: 0 % (ref 0.0–0.2)

## 2019-08-29 MED ORDER — SODIUM CHLORIDE 0.9 % IV SOLN
Freq: Once | INTRAVENOUS | Status: AC
  Administered 2019-08-29: 10:00:00 via INTRAVENOUS
  Filled 2019-08-29: qty 250

## 2019-08-29 MED ORDER — SODIUM CHLORIDE 0.9 % IV SOLN
240.0000 mg | Freq: Once | INTRAVENOUS | Status: AC
  Administered 2019-08-29: 240 mg via INTRAVENOUS
  Filled 2019-08-29: qty 24

## 2019-08-29 NOTE — Progress Notes (Signed)
[place] [phone]   [phone]  OFFICE PROGRESS NOTE  System, Pcp Not In No address on file  DIAGNOSIS: Metastatic high-grade neoplasm consistent with metastatic malignant melanoma based on the most recent pathology report from [facility] in October 2019, presented with large left left upper/left lower lobe mass with a hypermetabolic AP window lymph node as well as solitary metastatic brain lesion diagnosed in May 2019.  Biomarker Findings Microsatellite status - MS-Stable Tumor Mutational Burden - TMB-Intermediate (16 Muts/Mb) Genomic Findings For a complete list of the genes assayed, please refer to the Appendix. CD274 (PD-L1) amplification NRAS Q61R PDCD1LG2 (PD-L2) amplification MYC amplification EPHB1 amplification JAK2 amplification RB1 Q93*, G283* TERT promoter -146C>T TP53 C275W   PRIOR THERAPY: 1) left temporal craniotomy with resection of tumor with intraoperative stereotactic guidance for volumetric resection under the care of Dr. Annette Stable on 04/29/2018. 2) First-line treatment with immunotherapy with Keytruda 200 mg IV every 3 weeks.  First dose June 02, 2018.  Status post 3 cycles.  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 3) palliative radiotherapy to the left lung mass under the care of Dr. Lisbeth Renshaw completed on 08/24/2018. 4) Resuming her treatment again with Keytruda 200 mg IV every 3 weeks, first dose 09/08/2018.  Status post 2 cycles.  CURRENT THERAPY: Treatment with immunotherapy with ipilimumab 3 mg/KG and nivolumab 1 mg/KG every 3 weeks for the first 4 cycles followed by maintenance nivolumab 240 mg IV every 2 weeks.  First dose of this treatment October 26, 2018.  Status post 16 cycles.  Ipilimumab was discontinued after cycle #2 for the significant liver dysfunction.  Starting from cycle #3 the patient is on treatment with single agent nivolumab.  INTERVAL  HISTORY: Leslie [name] 43 y.o. female returns to the clinic today for follow-up visit.  The patient is feeling fine today with no concerning complaints.  She denied having any chest pain, shortness of breath, cough or hemoptysis.  She denied having any fever or chills.  She has no nausea, vomiting, diarrhea or constipation.  She was seen recently at the emergency department with epigastric and right upper quadrant pain but the work-up was unremarkable.  The patient denied having any recent weight loss or night sweats.  She has no headache or visual changes.  She had repeat CT scan of the chest, abdomen pelvis performed recently and she is here for evaluation and discussion of her scan results.  MEDICAL HISTORY: Past Medical History:  Diagnosis Date   met melanoma to brain and lung dx'd 04/2018   unknown origin   Migraines    Yeast infection     ALLERGIES:  is allergic to diflucan [fluconazole].  MEDICATIONS:  Current Outpatient Medications  Medication Sig Dispense Refill   AMERICAN GINSENG PO Take 1,500-3,000 mg by mouth daily. Depending on level of fatigue     Amino Acids (L-CARNITINE) LIQD Take 2,000 mg by mouth daily. Liquid L-Carnitine 2000 mg     Ascorbic Acid (LIQUID C 500) 500 MG/15ML LIQD Take 1,000 mg by mouth daily.     Beta Glucan POWD 1 Dose by Does not apply route daily. Beta D Glucan 400 mg     Calcium-Magnesium (CAL-MAG PO) Take 15 mLs by mouth daily. Marine Based Cal - Mag (500 mg Calcium / 250 mg Mag) 15 ml total     Coenzyme Q10 (COQ-10) 100 MG CAPS Take 1 tablet by mouth daily.     CREATINE MONOHYDRATE PO Take  6 g by mouth daily. Creatine MonoHydrate 6 g (on training cycle)     doxylamine, Sleep, (UNISOM) 25 MG tablet Take 12.5 mg by mouth at bedtime as needed for sleep.     Glucosamine-Chondroit-Vit C-Mn (GLUCOSAMINE 1500 COMPLEX PO) Take 1 tablet by mouth daily. Vegan CMO Glucosamine     Hyaluronic Acid-Vitamin C (HYALURONIC ACID PO) Take 100 mg by  mouth daily. Liquid Hyaluronic Acid 177 mg     Iron-Folic Acid-Vit L39 (IRON FORMULA PO) Take 1 Dose by mouth daily. Iron 50 mg (1/2 before workout and 1/2 afterwards - cycle 1 wk off and on)     Lysine POWD 1,600 mg by Does not apply route daily. L-Lysine Powder 1600 mg     Methylsulfonylmethane (MSM) 1000 MG CAPS Take 4 capsules by mouth daily. 4000 mg (1/2 before workout and 1/2 afterwards)     Multiple Vitamin (MULTIVITAMIN) tablet Take 1 tablet by mouth daily.     Multiple Vitamins-Minerals (ZINC PO) Take 1 tablet by mouth daily. Raw Zinc 30 mg (cycle every other week)     NON FORMULARY Take 1 Dose by mouth daily. Cordyceps 750 mg     NON FORMULARY Take 1 Dose by mouth daily. Liver Herbal Supplement 675 mg     NON FORMULARY Take 1 Dose by mouth daily. Reishi Mushroom 500 mg     NON FORMULARY Take 1 Dose by mouth daily. Curcumin Phytosome (Bioperrine) 3000 mg     NON FORMULARY Take 1 tablet by mouth daily. Artemisnin 200 mg     NON FORMULARY Take 1 tablet by mouth daily. Wellness Herbal Tablet as needed (Elderberry, Ashwagandha, etc)     NON FORMULARY Take 1 tablet by mouth daily. Zinc L Carnosine Complex 75/59 mg (after workout)     Omega 3-6-9 Fatty Acids (OMEGA-3-6-9 PO) Take 2 capsules by mouth daily. Plant Based Omega 3-6-9 2 capsules = 1825 mg     QUERCETIN PO Take 1 tablet by mouth daily. Quercetin 400 mg / Bromelain 85 mg     RESVERATROL PO Take 1,200 mg by mouth daily. Trans Resveratrol     VITAMIN A PO Take 1 tablet by mouth daily. 1500 mcg (5000 IU)     Vitamin D-Vitamin K (VITAMIN K2-VITAMIN D3 PO) Take 1 capsule by mouth daily. 5000 IU + 50 mcg     VITAMIN E PO Take 1 tablet by mouth daily. Vegan Vitamin E     No current facility-administered medications for this visit.     SURGICAL HISTORY:  Past Surgical History:  Procedure Laterality Date   APPLICATION OF CRANIAL NAVIGATION Left 04/29/2018   Procedure: APPLICATION OF CRANIAL NAVIGATION;  Surgeon: Earnie Larsson, MD;  Location: [place];  Service: Neurosurgery;  Laterality: Left;   CRANIOTOMY Left 04/29/2018   Procedure: LEFT CRANIOTOMY FOR  TUMOR BRAIN LAB;  Surgeon: Earnie Larsson, MD;  Location: [place];  Service: Neurosurgery;  Laterality: Left;    REVIEW OF SYSTEMS:  Constitutional: negative Eyes: negative Ears, nose, mouth, throat, and face: negative Respiratory: negative Cardiovascular: negative Gastrointestinal: negative Genitourinary:negative Integument/breast: negative Hematologic/lymphatic: negative Musculoskeletal:negative Neurological: negative Behavioral/Psych: negative Endocrine: negative Allergic/Immunologic: negative   PHYSICAL EXAMINATION: General appearance: alert, cooperative and no distress Head: Normocephalic, without obvious abnormality, atraumatic Neck: no adenopathy, no JVD, supple, symmetrical, trachea midline and thyroid not enlarged, symmetric, no tenderness/mass/nodules Lymph nodes: Cervical, supraclavicular, and axillary nodes normal. Resp: clear to auscultation bilaterally Back: negative, symmetric, no curvature. ROM normal. No CVA tenderness. Cardio: regular rate and  rhythm, S1, S2 normal, no murmur, click, rub or gallop GI: soft, non-tender; bowel sounds normal; no masses,  no organomegaly Extremities: extremities normal, atraumatic, no cyanosis or edema Neurologic: Alert and oriented X 3, normal strength and tone. Normal symmetric reflexes. Normal coordination and gait  ECOG PERFORMANCE STATUS: 0 - Asymptomatic  Blood pressure 123/78, pulse 63, temperature 97.8 F (36.6 C), resp. rate 18, height _0  (1.753 m), weight 149 lb 9.6 oz (67.9 kg), last menstrual period 08/19/2019, SpO2 100 %, unknown if currently breastfeeding.  LABORATORY DATA: Lab Results  Component Value Date   WBC 3.3 (L) 08/29/2019   HGB 14.5 08/29/2019   HCT 42.5 08/29/2019   MCV 91.0 08/29/2019   PLT 256 08/29/2019      Chemistry      Component Value Date/Time   NA 141  08/26/2019 0620   K 4.1 08/26/2019 0620   CL 107 08/26/2019 0620   CO2 24 08/26/2019 0620   BUN 16 08/26/2019 0620   CREATININE 0.86 08/26/2019 0620   CREATININE 0.93 08/15/2019 0910      Component Value Date/Time   CALCIUM 9.2 08/26/2019 0620   ALKPHOS 70 08/26/2019 0620   AST 42 (H) 08/26/2019 0620   AST 37 08/15/2019 0910   ALT 37 08/26/2019 0620   ALT 33 08/15/2019 0910   BILITOT 0.4 08/26/2019 0620   BILITOT 0.5 08/15/2019 0910       RADIOGRAPHIC STUDIES: Ct Chest W Contrast  Result Date: 08/23/2019 CLINICAL DATA:  Metastatic melanoma to brain and lung. Radiation therapy to brain 1 year ago. Ongoing immunotherapy. EXAM: CT CHEST, ABDOMEN, AND PELVIS WITH CONTRAST TECHNIQUE: Multidetector CT imaging of the chest, abdomen and pelvis was performed following the standard protocol during bolus administration of intravenous contrast. CONTRAST:  163m OMNIPAQUE IOHEXOL 300 MG/ML  SOLN COMPARISON:  06/09/2019 FINDINGS: CT CHEST FINDINGS Cardiovascular: Normal caliber of the aorta and branch vessels. Normal heart size, without pericardial effusion. No central pulmonary embolism, on this non-dedicated study. Mediastinum/Nodes: No supraclavicular adenopathy. No axillary adenopathy. No mediastinal or hilar adenopathy. Soft tissue density in the anterior mediastinum is likely residual thymus. Lungs/Pleura: Trace left-sided pleural fluid or thickening anteriorly, similar. Pleural-based right lower lobe 3 mm nodule on 134/6 is not significantly changed. A vague 4 mm left upper lobe pulmonary nodule on 42/6 is not significantly changed. Presumably radiation induced volume loss and areas of patchy consolidation at the left lung base are not significantly changed. The left juxta cardiophrenic/lingular low-density mass measures 4.4 x 2.9 cm on 45/2 versus 4.7 x 3.2 cm at the same level on the prior exam (when remeasured). 4.2 cm craniocaudal on coronal image 58 today versus 4.6 cm on the prior exam (when  remeasured). Musculoskeletal: No acute osseous abnormality. CT ABDOMEN PELVIS FINDINGS Hepatobiliary: Normal liver. Normal gallbladder, without biliary ductal dilatation. Pancreas: Normal, without mass or ductal dilatation. Spleen: Normal in size, without focal abnormality. Adrenals/Urinary Tract: Normal adrenal glands. Normal kidneys, without hydronephrosis. Normal urinary bladder. Stomach/Bowel: The stomach is decompressed and underdistended. Colonic stool burden suggests constipation. Normal terminal ileum. Normal small bowel. Vascular/Lymphatic: Normal caliber of the aorta and branch vessels. No abdominopelvic adenopathy. Reproductive: Normal uterus and adnexa.  Tampon in place. Other: Trace free pelvic fluid is likely physiologic. No evidence of omental or peritoneal disease. Musculoskeletal: Scattered small pelvic sclerotic lesions are similar and likely bone islands. IMPRESSION: 1. Decreased size of a juxta cardiophrenic/lingular mass. 2. No evidence of new or progressive disease. 3. Smaller pulmonary nodules are unchanged,  favored to be benign. 4.  Possible constipation. Electronically Signed   By: Abigail Miyamoto M.D.   On: 08/23/2019 08:53   Ct Abdomen Pelvis W Contrast  Result Date: 08/23/2019 CLINICAL DATA:  Metastatic melanoma to brain and lung. Radiation therapy to brain 1 year ago. Ongoing immunotherapy. EXAM: CT CHEST, ABDOMEN, AND PELVIS WITH CONTRAST TECHNIQUE: Multidetector CT imaging of the chest, abdomen and pelvis was performed following the standard protocol during bolus administration of intravenous contrast. CONTRAST:  184m OMNIPAQUE IOHEXOL 300 MG/ML  SOLN COMPARISON:  06/09/2019 FINDINGS: CT CHEST FINDINGS Cardiovascular: Normal caliber of the aorta and branch vessels. Normal heart size, without pericardial effusion. No central pulmonary embolism, on this non-dedicated study. Mediastinum/Nodes: No supraclavicular adenopathy. No axillary adenopathy. No mediastinal or hilar adenopathy. Soft  tissue density in the anterior mediastinum is likely residual thymus. Lungs/Pleura: Trace left-sided pleural fluid or thickening anteriorly, similar. Pleural-based right lower lobe 3 mm nodule on 134/6 is not significantly changed. A vague 4 mm left upper lobe pulmonary nodule on 42/6 is not significantly changed. Presumably radiation induced volume loss and areas of patchy consolidation at the left lung base are not significantly changed. The left juxta cardiophrenic/lingular low-density mass measures 4.4 x 2.9 cm on 45/2 versus 4.7 x 3.2 cm at the same level on the prior exam (when remeasured). 4.2 cm craniocaudal on coronal image 58 today versus 4.6 cm on the prior exam (when remeasured). Musculoskeletal: No acute osseous abnormality. CT ABDOMEN PELVIS FINDINGS Hepatobiliary: Normal liver. Normal gallbladder, without biliary ductal dilatation. Pancreas: Normal, without mass or ductal dilatation. Spleen: Normal in size, without focal abnormality. Adrenals/Urinary Tract: Normal adrenal glands. Normal kidneys, without hydronephrosis. Normal urinary bladder. Stomach/Bowel: The stomach is decompressed and underdistended. Colonic stool burden suggests constipation. Normal terminal ileum. Normal small bowel. Vascular/Lymphatic: Normal caliber of the aorta and branch vessels. No abdominopelvic adenopathy. Reproductive: Normal uterus and adnexa.  Tampon in place. Other: Trace free pelvic fluid is likely physiologic. No evidence of omental or peritoneal disease. Musculoskeletal: Scattered small pelvic sclerotic lesions are similar and likely bone islands. IMPRESSION: 1. Decreased size of a juxta cardiophrenic/lingular mass. 2. No evidence of new or progressive disease. 3. Smaller pulmonary nodules are unchanged, favored to be benign. 4.  Possible constipation. Electronically Signed   By: KAbigail MiyamotoM.D.   On: 08/23/2019 08:53    ASSESSMENT AND PLAN: This is a very pleasant 43years old white female with highly  suspicious metastatic malignant melanoma presented with large mass in the left upper/left lower lobe and mediastinal lymphadenopathy as well as solitary brain metastasis status post left temporal craniotomy and resection of tumor on 04/29/2018 and she is recovering well from her surgery. The patient completed stereotactic radiotherapy to the resection cavity next week under the care of Dr. MLisbeth Renshaw The patient underwent treatment with immunotherapy with Keytruda 200 mg IV every 3 weeks status post 3 cycles.   Her scan after cycle #3 showed enlargement of the left upper lobe lung mass.  This was also suspicious for pseudo-progression on immunotherapy.  The patient was started on a palliative course of radiotherapy to the left upper lobe lung mass and she tolerated this treatment fairly well.  [facility] undergoes further molecular studies.  The patient was treated with 2 more cycles of Keytruda and tolerated the treatment well. Her recent CT scan of the chest, abdomen  and pelvis showed some improvement in the left upper lobe lung mass but there is still concern about pericardial invasion. I personally and independently reviewed the scan images and discussed the result and showed the images to the patient and her boyfriend today.  She is still not a good candidate for surgical resection because of the pericardial invasion. The final pathology report and recommendation from [facility] was consistent with metastatic melanoma and the recommendation is to switch the patient to a combination immunotherapy with Ipilumumab and nivolumab. The patient is currently undergoing treatment with immunotherapy with ipilimumab and nivolumab status post 2 cycles.  Her treatment was on hold for more than 6 weeks secondary to grade 3 hepatic dysfunction.  She had improvement of her liver  enzyme after a prolonged treatment with a steroid with a tapering schedule. She resumed her treatment again with single agent nivolumab.  She is status post 16 cycles of treatment. She has been tolerating this treatment well with no concerning adverse effects. She had repeat CT scan of the chest, abdomen pelvis performed recently.  I personally and independently reviewed the scans and discussed the results with the patient today. Her scan showed further mild decrease in the size of the left hilar lung mass.  I personally and independently reviewed the scan images and discussed the result and showed the images to the patient today. I recommended for the patient to continue her current treatment with nivolumab and she will proceed with cycle #17 today. We will see her back for follow-up visit in 2 weeks for evaluation before the next cycle of her treatment. She still interested in considering surgical resection at some point in the future but there is no significant reduction in the size of the tumor or separation from the heart border at this point. The patient was advised to call immediately if she has any concerning symptoms in the interval. The patient voices understanding of current disease status and treatment options and is in agreement with the current care plan. All questions were answered. The patient knows to call the clinic with any problems, questions or concerns. We can certainly see the patient much sooner if necessary.  Disclaimer: This note was dictated with voice recognition software. Similar sounding words can inadvertently be transcribed and may not be corrected upon review.

## 2019-08-29 NOTE — Patient Instructions (Signed)
[facility] Discharge Instructions for Patients Receiving Chemotherapy  Today you received the following chemotherapy agents: Nivolumab  To help prevent nausea and vomiting after your treatment, we encourage you to take your nausea medication as directed.    If you develop nausea and vomiting that is not controlled by your nausea medication, call the clinic.   BELOW ARE SYMPTOMS THAT SHOULD BE REPORTED IMMEDIATELY:  *FEVER GREATER THAN 100.5 F  *CHILLS WITH OR WITHOUT FEVER  NAUSEA AND VOMITING THAT IS NOT CONTROLLED WITH YOUR NAUSEA MEDICATION  *UNUSUAL SHORTNESS OF BREATH  *UNUSUAL BRUISING OR BLEEDING  TENDERNESS IN MOUTH AND THROAT WITH OR WITHOUT PRESENCE OF ULCERS  *URINARY PROBLEMS  *BOWEL PROBLEMS  UNUSUAL RASH Items with * indicate a potential emergency and should be followed up as soon as possible.  Feel free to call the clinic should you have any questions or concerns. The clinic phone number is [phone].  Please show the [place] at check-in to the Emergency Department and triage nurse.

## 2019-09-12 ENCOUNTER — Inpatient Hospital Stay: Payer: BC Managed Care – PPO

## 2019-09-12 ENCOUNTER — Telehealth: Payer: Self-pay | Admitting: Internal Medicine

## 2019-09-12 ENCOUNTER — Inpatient Hospital Stay (HOSPITAL_BASED_OUTPATIENT_CLINIC_OR_DEPARTMENT_OTHER): Payer: BC Managed Care – PPO | Admitting: Physician Assistant

## 2019-09-12 ENCOUNTER — Other Ambulatory Visit: Payer: Self-pay

## 2019-09-12 VITALS — BP 120/74 | HR 82 | Temp 97.8°F | Resp 18 | Ht 69.0 in | Wt 151.3 lb

## 2019-09-12 DIAGNOSIS — C3432 Malignant neoplasm of lower lobe, left bronchus or lung: Secondary | ICD-10-CM

## 2019-09-12 DIAGNOSIS — Z923 Personal history of irradiation: Secondary | ICD-10-CM | POA: Diagnosis not present

## 2019-09-12 DIAGNOSIS — C7802 Secondary malignant neoplasm of left lung: Secondary | ICD-10-CM

## 2019-09-12 DIAGNOSIS — Z5112 Encounter for antineoplastic immunotherapy: Secondary | ICD-10-CM | POA: Diagnosis not present

## 2019-09-12 DIAGNOSIS — C7951 Secondary malignant neoplasm of bone: Secondary | ICD-10-CM | POA: Diagnosis not present

## 2019-09-12 DIAGNOSIS — C439 Malignant melanoma of skin, unspecified: Secondary | ICD-10-CM | POA: Diagnosis not present

## 2019-09-12 LAB — CMP (CANCER CENTER ONLY)
ALT: 32 U/L (ref 0–44)
AST: 38 U/L (ref 15–41)
Albumin: 4.2 g/dL (ref 3.5–5.0)
Alkaline Phosphatase: 86 U/L (ref 38–126)
Anion gap: 9 (ref 5–15)
BUN: 12 mg/dL (ref 6–20)
CO2: 22 mmol/L (ref 22–32)
Calcium: 8.8 mg/dL — ABNORMAL LOW (ref 8.9–10.3)
Chloride: 108 mmol/L (ref 98–111)
Creatinine: 1 mg/dL (ref 0.44–1.00)
GFR, Est AFR Am: >60 mL/min (ref >60)
GFR, Estimated: >60 mL/min (ref >60)
Glucose, Bld: 89 mg/dL (ref 70–99)
Potassium: 4 mmol/L (ref 3.5–5.1)
Sodium: 139 mmol/L (ref 135–145)
Total Bilirubin: 0.5 mg/dL (ref 0.3–1.2)
Total Protein: 6.8 g/dL (ref 6.5–8.1)

## 2019-09-12 LAB — CBC WITH DIFFERENTIAL (CANCER CENTER ONLY)
Abs Immature Granulocytes: 0.01 10*3/uL (ref 0.00–0.07)
Basophils Absolute: 0 10*3/uL (ref 0.0–0.1)
Basophils Relative: 1 %
Eosinophils Absolute: 0.1 10*3/uL (ref 0.0–0.5)
Eosinophils Relative: 4 %
HCT: 41.5 % (ref 36.0–46.0)
Hemoglobin: 14 g/dL (ref 12.0–15.0)
Immature Granulocytes: 0 %
Lymphocytes Relative: 19 %
Lymphs Abs: 0.6 10*3/uL — ABNORMAL LOW (ref 0.7–4.0)
MCH: 30.6 pg (ref 26.0–34.0)
MCHC: 33.7 g/dL (ref 30.0–36.0)
MCV: 90.8 fL (ref 80.0–100.0)
Monocytes Absolute: 0.5 10*3/uL (ref 0.1–1.0)
Monocytes Relative: 15 %
Neutro Abs: 2 10*3/uL (ref 1.7–7.7)
Neutrophils Relative %: 61 %
Platelet Count: 228 10*3/uL (ref 150–400)
RBC: 4.57 MIL/uL (ref 3.87–5.11)
RDW: 12.9 % (ref 11.5–15.5)
WBC Count: 3.3 10*3/uL — ABNORMAL LOW (ref 4.0–10.5)
nRBC: 0 % (ref 0.0–0.2)

## 2019-09-12 LAB — TSH: TSH: 2.627 u[IU]/mL (ref 0.308–3.960)

## 2019-09-12 MED ORDER — SODIUM CHLORIDE 0.9 % IV SOLN
240.0000 mg | Freq: Once | INTRAVENOUS | Status: AC
  Administered 2019-09-12: 10:00:00 240 mg via INTRAVENOUS
  Filled 2019-09-12: qty 24

## 2019-09-12 MED ORDER — SODIUM CHLORIDE 0.9 % IV SOLN
Freq: Once | INTRAVENOUS | Status: AC
  Administered 2019-09-12: 09:00:00 via INTRAVENOUS
  Filled 2019-09-12: qty 250

## 2019-09-12 NOTE — Progress Notes (Signed)
[place] OFFICE PROGRESS NOTE  System, Pcp Not In No address on file  DIAGNOSIS: Metastatic high-grade neoplasm consistent with metastatic malignant melanoma based on the most recent pathology report from [facility] in October 2019, presented with large left left upper/left lower lobe mass with a hypermetabolic AP window lymph node as well as solitary metastatic brain lesion diagnosed in May 2019.  Biomarker Findings Microsatellite status - MS-Stable Tumor Mutational Burden - TMB-Intermediate (16 Muts/Mb) Genomic Findings For a complete list of the genes assayed, please refer to the Appendix. CD274 (PD-L1) amplification NRAS Q61R PDCD1LG2 (PD-L2) amplification MYC amplification EPHB1 amplification JAK2 amplification RB1 Q93*, E366* TERT promoter -146C>T TP53 C275W   PRIOR THERAPY: 1) left temporal craniotomy with resection of tumor with intraoperative stereotactic guidance for volumetric resection under the care of Dr. Annette Stable on 04/29/2018. 2) First-line treatment with immunotherapy with Keytruda 200 mg IV every 3 weeks. First dose June 02, 2018. Status post 3 cycles. 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 3) palliative radiotherapy to the left lung mass under the care of Dr. Lisbeth Renshaw completed on 08/24/2018. 4) Resuming her treatment again with Keytruda 200 mg IV every 3 weeks, first dose 09/08/2018. Status post 2 cycles.  CURRENT THERAPY: Treatment with immunotherapy with ipilimumab 3 mg/KG and nivolumab 1 mg/KG every 3 weeks.First dose on October 26, 2018. Status post 17cycles. Ipilimumab was discontinued after cycle #2 due to significant liver dysfunction. Starting from cycle #3 the patient is on treatment with single agent nivolumab. She is status post 14 cycles of single agent nivolumab.  INTERVAL HISTORY: Leslie [name] 43 y.o. female returns to the clinic for a follow up visit. The patient is  feeling well today without any concerning complaints. The patient continues to tolerate immunotherapy with nivolumab well without any adverse side effects. Denies any fever, chills, night sweats, or weight loss. Denies any chest pain, shortness of breath, cough, or hemoptysis. Denies any nausea, vomiting, diarrhea, or constipation. Denies any headache or visual changes. She is followed closely by radiation oncology and neurosurgery for her history of metastatic disease to the brain. She is scheduled with a follow up brain MRI next month and for a visit with radiation oncology on 10/23/2019. Denies any rashes or skin changes. The patient is here today for evaluation prior to starting cycle #15 of single agent nivolumab.   MEDICAL HISTORY: Past Medical History:  Diagnosis Date  . 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 Migraines   . Yeast infection     ALLERGIES:  is allergic to diflucan [fluconazole].  MEDICATIONS:  Current Outpatient Medications  Medication Sig Dispense Refill  . AMERICAN GINSENG PO Take 1,500-3,000 mg by mouth daily. Depending on level of fatigue    . Amino Acids (L-CARNITINE) LIQD Take 2,000 mg by mouth daily. Liquid L-Carnitine 2000 mg    . Ascorbic Acid (LIQUID C 500) 500 MG/15ML LIQD Take 1,000 mg by mouth daily.    . Beta Glucan POWD 1 Dose by Does not apply route daily. Beta D Glucan 400 mg    . Calcium-Magnesium (CAL-MAG PO) Take 15 mLs by mouth daily. Marine Based Cal - Mag (500 mg Calcium / 250 mg Mag) 15 ml total    . Coenzyme Q10 (COQ-10) 100 MG CAPS Take 1 tablet by mouth daily.    Marland Kitchen CREATINE MONOHYDRATE PO Take 6 g by mouth daily. Creatine MonoHydrate 6 g (on training cycle)    . doxylamine,  Sleep, (UNISOM) 25 MG tablet Take 12.5 mg by mouth at bedtime as needed for sleep.    . Glucosamine-Chondroit-Vit C-Mn (GLUCOSAMINE 1500 COMPLEX PO) Take 1 tablet by mouth daily. Vegan CMO Glucosamine    . Hyaluronic Acid-Vitamin C (HYALURONIC ACID PO)  Take 100 mg by mouth daily. Liquid Hyaluronic Acid 100 mg    . Iron-Folic Acid-Vit C94 (IRON FORMULA PO) Take 1 Dose by mouth daily. Iron 50 mg (1/2 before workout and 1/2 afterwards - cycle 1 wk off and on)    . Lysine POWD 1,600 mg by Does not apply route daily. L-Lysine Powder 1600 mg    . Methylsulfonylmethane (MSM) 1000 MG CAPS Take 4 capsules by mouth daily. 4000 mg (1/2 before workout and 1/2 afterwards)    . Multiple Vitamin (MULTIVITAMIN) tablet Take 1 tablet by mouth daily.    . Multiple Vitamins-Minerals (ZINC PO) Take 1 tablet by mouth daily. Raw Zinc 30 mg (cycle every other week)    . NON FORMULARY Take 1 Dose by mouth daily. Cordyceps 750 mg    . NON FORMULARY Take 1 Dose by mouth daily. Liver Herbal Supplement 675 mg    . NON FORMULARY Take 1 Dose by mouth daily. Reishi Mushroom 500 mg    . NON FORMULARY Take 1 Dose by mouth daily. Curcumin Phytosome (Bioperrine) 3000 mg    . NON FORMULARY Take 1 tablet by mouth daily. Artemisnin 200 mg    . NON FORMULARY Take 1 tablet by mouth daily. Wellness Herbal Tablet as needed (Elderberry, Ashwagandha, etc)    . NON FORMULARY Take 1 tablet by mouth daily. Zinc L Carnosine Complex 75/59 mg (after workout)    . Omega 3-6-9 Fatty Acids (OMEGA-3-6-9 PO) Take 2 capsules by mouth daily. Plant Based Omega 3-6-9 2 capsules = 1825 mg    . QUERCETIN PO Take 1 tablet by mouth daily. Quercetin 400 mg / Bromelain 85 mg    . RESVERATROL PO Take 1,200 mg by mouth daily. Trans Resveratrol    . VITAMIN A PO Take 1 tablet by mouth daily. 1500 mcg (5000 IU)    . Vitamin D-Vitamin K (VITAMIN K2-VITAMIN D3 PO) Take 1 capsule by mouth daily. 5000 IU + 50 mcg    . VITAMIN E PO Take 1 tablet by mouth daily. Vegan Vitamin E     No current facility-administered medications for this visit.    Facility-Administered Medications Ordered in Other Visits  Medication Dose Route Frequency Provider Last Rate Last Dose  . nivolumab (OPDIVO) 240 mg in sodium chloride 0.9 %  100 mL chemo infusion  240 mg Intravenous Once Curt Bears, MD 248 mL/hr at 09/12/19 0949 240 mg at 09/12/19 0949    SURGICAL HISTORY:  Past Surgical History:  Procedure Laterality Date  . APPLICATION OF CRANIAL NAVIGATION Left 04/29/2018   Procedure: APPLICATION OF CRANIAL NAVIGATION;  Surgeon: Earnie Larsson, MD;  Location: [facility];  Service: Neurosurgery;  Laterality: Left;  . CRANIOTOMY Left 04/29/2018   Procedure: LEFT CRANIOTOMY FOR  TUMOR BRAIN LAB;  Surgeon: Earnie Larsson, MD;  Location: [place];  Service: Neurosurgery;  Laterality: Left;    REVIEW OF SYSTEMS:   Review of Systems  Constitutional: Negative for appetite change, chills, fatigue, fever and unexpected weight change.  HENT:   Negative for mouth sores, nosebleeds, sore throat and trouble swallowing.   Eyes: Negative for eye problems and icterus.  Respiratory: Negative for cough, hemoptysis, shortness of breath and wheezing.   Cardiovascular: Negative for chest  pain and leg swelling.  Gastrointestinal: Negative for abdominal pain, constipation, diarrhea, nausea and vomiting.  Genitourinary: Negative for bladder incontinence, difficulty urinating, dysuria, frequency and hematuria.   Musculoskeletal: Negative for back pain, gait problem, neck pain and neck stiffness.  Skin: Negative for itching and rash.  Neurological: Negative for dizziness, extremity weakness, gait problem, headaches, light-headedness and seizures.  Hematological: Negative for adenopathy. Does not bruise/bleed easily.  Psychiatric/Behavioral: Negative for confusion, depression and sleep disturbance. The patient is not nervous/anxious.     PHYSICAL EXAMINATION:  Blood pressure 120/74, pulse 82, temperature 97.8 F (36.6 C), temperature source Oral, resp. rate 18, height _0  (1.753 m), weight 151 lb 4.8 oz (68.6 kg), last menstrual period 08/19/2019, SpO2 100 %, unknown if currently breastfeeding.  ECOG PERFORMANCE STATUS: 1 - Symptomatic but completely  ambulatory  Physical Exam  Constitutional: Oriented to person, place, and time and well-developed, well-nourished, and in no distress. No distress.  HENT:  Head: Normocephalic and atraumatic.  Mouth/Throat: Oropharynx is clear and moist. No oropharyngeal exudate.  Eyes: Conjunctivae are normal. Right eye exhibits no discharge. Left eye exhibits no discharge. No scleral icterus.  Neck: Normal range of motion. Neck supple.  Cardiovascular: Normal rate, regular rhythm, normal heart sounds and intact distal pulses.   Pulmonary/Chest: Effort normal and breath sounds normal. No respiratory distress. No wheezes. No rales.  Abdominal: Soft. Bowel sounds are normal. Exhibits no distension and no mass. There is no tenderness.  Musculoskeletal: Normal range of motion. Exhibits no edema.  Lymphadenopathy:    No cervical adenopathy.  Neurological: Alert and oriented to person, place, and time. Exhibits normal muscle tone. Gait normal. Coordination normal.  Skin: Skin is warm and dry. No rash noted. Not diaphoretic. No erythema. No pallor.  Psychiatric: Mood, memory and judgment normal.  Vitals reviewed.  LABORATORY DATA: Lab Results  Component Value Date   WBC 3.3 (L) 09/12/2019   HGB 14.0 09/12/2019   HCT 41.5 09/12/2019   MCV 90.8 09/12/2019   PLT 228 09/12/2019      Chemistry      Component Value Date/Time   NA 139 09/12/2019 0828   K 4.0 09/12/2019 0828   CL 108 09/12/2019 0828   CO2 22 09/12/2019 0828   BUN 12 09/12/2019 0828   CREATININE 1.00 09/12/2019 0828      Component Value Date/Time   CALCIUM 8.8 (L) 09/12/2019 0828   ALKPHOS 86 09/12/2019 0828   AST 38 09/12/2019 0828   ALT 32 09/12/2019 0828   BILITOT 0.5 09/12/2019 0828       RADIOGRAPHIC STUDIES:  Ct Chest W Contrast  Result Date: 08/23/2019 CLINICAL DATA:  Metastatic melanoma to brain and lung. Radiation therapy to brain 1 year ago. Ongoing immunotherapy. EXAM: CT CHEST, ABDOMEN, AND PELVIS WITH CONTRAST  TECHNIQUE: Multidetector CT imaging of the chest, abdomen and pelvis was performed following the standard protocol during bolus administration of intravenous contrast. CONTRAST:  116m OMNIPAQUE IOHEXOL 300 MG/ML  SOLN COMPARISON:  06/09/2019 FINDINGS: CT CHEST FINDINGS Cardiovascular: Normal caliber of the aorta and branch vessels. Normal heart size, without pericardial effusion. No central pulmonary embolism, on this non-dedicated study. Mediastinum/Nodes: No supraclavicular adenopathy. No axillary adenopathy. No mediastinal or hilar adenopathy. Soft tissue density in the anterior mediastinum is likely residual thymus. Lungs/Pleura: Trace left-sided pleural fluid or thickening anteriorly, similar. Pleural-based right lower lobe 3 mm nodule on 134/6 is not significantly changed. A vague 4 mm left upper lobe pulmonary nodule on 42/6 is not significantly changed.  Presumably radiation induced volume loss and areas of patchy consolidation at the left lung base are not significantly changed. The left juxta cardiophrenic/lingular low-density mass measures 4.4 x 2.9 cm on 45/2 versus 4.7 x 3.2 cm at the same level on the prior exam (when remeasured). 4.2 cm craniocaudal on coronal image 58 today versus 4.6 cm on the prior exam (when remeasured). Musculoskeletal: No acute osseous abnormality. CT ABDOMEN PELVIS FINDINGS Hepatobiliary: Normal liver. Normal gallbladder, without biliary ductal dilatation. Pancreas: Normal, without mass or ductal dilatation. Spleen: Normal in size, without focal abnormality. Adrenals/Urinary Tract: Normal adrenal glands. Normal kidneys, without hydronephrosis. Normal urinary bladder. Stomach/Bowel: The stomach is decompressed and underdistended. Colonic stool burden suggests constipation. Normal terminal ileum. Normal small bowel. Vascular/Lymphatic: Normal caliber of the aorta and branch vessels. No abdominopelvic adenopathy. Reproductive: Normal uterus and adnexa.  Tampon in place. Other:  Trace free pelvic fluid is likely physiologic. No evidence of omental or peritoneal disease. Musculoskeletal: Scattered small pelvic sclerotic lesions are similar and likely bone islands. IMPRESSION: 1. Decreased size of a juxta cardiophrenic/lingular mass. 2. No evidence of new or progressive disease. 3. Smaller pulmonary nodules are unchanged, favored to be benign. 4.  Possible constipation. Electronically Signed   By: Abigail Miyamoto M.D.   On: 08/23/2019 08:53   Ct Abdomen Pelvis W Contrast  Result Date: 08/23/2019 CLINICAL DATA:  Metastatic melanoma to brain and lung. Radiation therapy to brain 1 year ago. Ongoing immunotherapy. EXAM: CT CHEST, ABDOMEN, AND PELVIS WITH CONTRAST TECHNIQUE: Multidetector CT imaging of the chest, abdomen and pelvis was performed following the standard protocol during bolus administration of intravenous contrast. CONTRAST:  188m OMNIPAQUE IOHEXOL 300 MG/ML  SOLN COMPARISON:  06/09/2019 FINDINGS: CT CHEST FINDINGS Cardiovascular: Normal caliber of the aorta and branch vessels. Normal heart size, without pericardial effusion. No central pulmonary embolism, on this non-dedicated study. Mediastinum/Nodes: No supraclavicular adenopathy. No axillary adenopathy. No mediastinal or hilar adenopathy. Soft tissue density in the anterior mediastinum is likely residual thymus. Lungs/Pleura: Trace left-sided pleural fluid or thickening anteriorly, similar. Pleural-based right lower lobe 3 mm nodule on 134/6 is not significantly changed. A vague 4 mm left upper lobe pulmonary nodule on 42/6 is not significantly changed. Presumably radiation induced volume loss and areas of patchy consolidation at the left lung base are not significantly changed. The left juxta cardiophrenic/lingular low-density mass measures 4.4 x 2.9 cm on 45/2 versus 4.7 x 3.2 cm at the same level on the prior exam (when remeasured). 4.2 cm craniocaudal on coronal image 58 today versus 4.6 cm on the prior exam (when  remeasured). Musculoskeletal: No acute osseous abnormality. CT ABDOMEN PELVIS FINDINGS Hepatobiliary: Normal liver. Normal gallbladder, without biliary ductal dilatation. Pancreas: Normal, without mass or ductal dilatation. Spleen: Normal in size, without focal abnormality. Adrenals/Urinary Tract: Normal adrenal glands. Normal kidneys, without hydronephrosis. Normal urinary bladder. Stomach/Bowel: The stomach is decompressed and underdistended. Colonic stool burden suggests constipation. Normal terminal ileum. Normal small bowel. Vascular/Lymphatic: Normal caliber of the aorta and branch vessels. No abdominopelvic adenopathy. Reproductive: Normal uterus and adnexa.  Tampon in place. Other: Trace free pelvic fluid is likely physiologic. No evidence of omental or peritoneal disease. Musculoskeletal: Scattered small pelvic sclerotic lesions are similar and likely bone islands. IMPRESSION: 1. Decreased size of a juxta cardiophrenic/lingular mass. 2. No evidence of new or progressive disease. 3. Smaller pulmonary nodules are unchanged, favored to be benign. 4.  Possible constipation. Electronically Signed   By: KAbigail MiyamotoM.D.   On: 08/23/2019 08:53  ASSESSMENT/PLAN:  This is a very pleasant 43 year old Caucasian female who presented with a large mass in theleftupper/lower lobe and mediastinal adenopathy as well as a solitary brain metastasis highly suspicious for malignant melanoma.The final pathology report from [facility] malignancy was consistent with metastatic melanoma.  She is status post a left temporal craniotomy and resection of the tumor on 04/29/2018. The patient also completed stereotactic radiotherapy to the resection cavity under the care of Dr. Lisbeth Renshaw. She previously underwent treatment with immunotherapy with Keytruda 200 mg IV every 3 weeks. She is status post 3 cycles. CT scanafter cycle #3 showed enlargement of the left upper lobe lung mass. This was  suspicious for pseudo-progression on immunotherapy. The patient was started on palliative course of radiotherapy to the left upper lobe mass and she tolerated this treatment fairly well. She was seen by Dr. Gavin Potters and she recommended for the patient to resume her treatment with [facility] until she undergoes further molecular studies. The patient was treated with 2 more cycles of Keytruda and tolerated the treatment well. The patient then had a CT scan performed which showed pericardial invasion. Therefore, the patient is not a good candidate for surgical resection.   From her final pathology report and recommendations from [facility], the patient was switched to immunotherapy withipilimumaband nivolumab. She isstatus post 2 cycles.Treatment was on hold for more than 6 weeks secondary to grade 3 hepatic dysfunction. She had improvement of her liver enzymes after a prolonged treatment with a tapering scheduleof steroids.  She is currently undergoing single agent nivolumab 240 mg IV every 2 weeks. She is status post14cycles with single agentnivolumab 240 mg IV every 2 weeks. She has beentoleratingtreatment well.  Labs were reviewed with the patient. We recommend that she proceed with cycle #15 of single agent nivolumab today as scheduled.   We will see her back fora follow upin 2 weeks for evaluationbefore starting cycle #16 of single agent nivolumab.  The patient was advised to call immediately if she has any concerning symptoms in the interval. The patient voices understanding of current disease status and treatment options and is in agreement with the current care plan. All questions were answered. The patient knows to call the clinic with any problems, questions or concerns. We can certainly see the patient much sooner if necessary  No orders of the defined types were placed in this encounter.    Leslie Pottle L Rashae Rother,  PA-C 09/12/19

## 2019-09-12 NOTE — Telephone Encounter (Signed)
Scheduled appt per 9/29 los - pt to get an updated schedule next visit.

## 2019-09-14 ENCOUNTER — Telehealth: Payer: Self-pay | Admitting: Medical Oncology

## 2019-09-14 ENCOUNTER — Telehealth: Payer: Self-pay | Admitting: Radiation Therapy

## 2019-09-14 NOTE — Telephone Encounter (Signed)
Asking about MRI order . She has  appt with neurosurgeon ,Dr Trenton Gammon on  10/14 then  XRT  In nov.

## 2019-09-14 NOTE — Telephone Encounter (Signed)
I called Dr. Bufford Buttner office to verify if he is planning to do a scan prior to Johnna's upcoming visit. Per his secretary, Caryl Pina, this is a follow-up without an MRI, he has not requested a scan and plans to follow with the scans done here in the brain program. Yuritza's next scan is due in November followed by a visit with Bryson Ha to review the results.  I called Marlean and explained the plan, she is very happy with this.    Mont Dutton R.T.(R)(T)

## 2019-09-14 NOTE — Telephone Encounter (Signed)
Leslie [name]- are we ordering this MRI or is Dr. Annette Stable??

## 2019-09-26 ENCOUNTER — Inpatient Hospital Stay: Payer: BC Managed Care – PPO

## 2019-09-26 ENCOUNTER — Encounter: Payer: Self-pay | Admitting: Internal Medicine

## 2019-09-26 ENCOUNTER — Inpatient Hospital Stay: Payer: BC Managed Care – PPO | Attending: Physician Assistant

## 2019-09-26 ENCOUNTER — Inpatient Hospital Stay (HOSPITAL_BASED_OUTPATIENT_CLINIC_OR_DEPARTMENT_OTHER): Payer: BC Managed Care – PPO | Admitting: Internal Medicine

## 2019-09-26 ENCOUNTER — Other Ambulatory Visit: Payer: Self-pay

## 2019-09-26 ENCOUNTER — Telehealth: Payer: Self-pay

## 2019-09-26 VITALS — BP 117/72 | HR 100 | Temp 98.7°F | Resp 18 | Ht 69.0 in | Wt 152.4 lb

## 2019-09-26 DIAGNOSIS — C3432 Malignant neoplasm of lower lobe, left bronchus or lung: Secondary | ICD-10-CM

## 2019-09-26 DIAGNOSIS — Z79899 Other long term (current) drug therapy: Secondary | ICD-10-CM | POA: Diagnosis not present

## 2019-09-26 DIAGNOSIS — R1013 Epigastric pain: Secondary | ICD-10-CM | POA: Diagnosis not present

## 2019-09-26 DIAGNOSIS — C7931 Secondary malignant neoplasm of brain: Secondary | ICD-10-CM

## 2019-09-26 DIAGNOSIS — C7802 Secondary malignant neoplasm of left lung: Secondary | ICD-10-CM | POA: Diagnosis not present

## 2019-09-26 DIAGNOSIS — Z5112 Encounter for antineoplastic immunotherapy: Secondary | ICD-10-CM | POA: Diagnosis not present

## 2019-09-26 DIAGNOSIS — C439 Malignant melanoma of skin, unspecified: Secondary | ICD-10-CM | POA: Insufficient documentation

## 2019-09-26 LAB — CBC WITH DIFFERENTIAL (CANCER CENTER ONLY)
Abs Immature Granulocytes: 0 10*3/uL (ref 0.00–0.07)
Basophils Absolute: 0 10*3/uL (ref 0.0–0.1)
Basophils Relative: 1 %
Eosinophils Absolute: 0.2 10*3/uL (ref 0.0–0.5)
Eosinophils Relative: 4 %
HCT: 40.1 % (ref 36.0–46.0)
Hemoglobin: 13.8 g/dL (ref 12.0–15.0)
Immature Granulocytes: 0 %
Lymphocytes Relative: 18 %
Lymphs Abs: 0.7 10*3/uL (ref 0.7–4.0)
MCH: 30.7 pg (ref 26.0–34.0)
MCHC: 34.4 g/dL (ref 30.0–36.0)
MCV: 89.3 fL (ref 80.0–100.0)
Monocytes Absolute: 0.5 10*3/uL (ref 0.1–1.0)
Monocytes Relative: 14 %
Neutro Abs: 2.5 10*3/uL (ref 1.7–7.7)
Neutrophils Relative %: 63 %
Platelet Count: 244 10*3/uL (ref 150–400)
RBC: 4.49 MIL/uL (ref 3.87–5.11)
RDW: 12.7 % (ref 11.5–15.5)
WBC Count: 3.9 10*3/uL — ABNORMAL LOW (ref 4.0–10.5)
nRBC: 0 % (ref 0.0–0.2)

## 2019-09-26 LAB — CMP (CANCER CENTER ONLY)
ALT: 32 U/L (ref 0–44)
AST: 37 U/L (ref 15–41)
Albumin: 4 g/dL (ref 3.5–5.0)
Alkaline Phosphatase: 86 U/L (ref 38–126)
Anion gap: 8 (ref 5–15)
BUN: 10 mg/dL (ref 6–20)
CO2: 25 mmol/L (ref 22–32)
Calcium: 9.1 mg/dL (ref 8.9–10.3)
Chloride: 108 mmol/L (ref 98–111)
Creatinine: 0.9 mg/dL (ref 0.44–1.00)
GFR, Est AFR Am: >60 mL/min (ref >60)
GFR, Estimated: >60 mL/min (ref >60)
Glucose, Bld: 88 mg/dL (ref 70–99)
Potassium: 4.2 mmol/L (ref 3.5–5.1)
Sodium: 141 mmol/L (ref 135–145)
Total Bilirubin: 0.4 mg/dL (ref 0.3–1.2)
Total Protein: 6.6 g/dL (ref 6.5–8.1)

## 2019-09-26 MED ORDER — SODIUM CHLORIDE 0.9 % IV SOLN
Freq: Once | INTRAVENOUS | Status: AC
  Administered 2019-09-26: 10:00:00 via INTRAVENOUS
  Filled 2019-09-26: qty 250

## 2019-09-26 MED ORDER — SODIUM CHLORIDE 0.9 % IV SOLN
240.0000 mg | Freq: Once | INTRAVENOUS | Status: AC
  Administered 2019-09-26: 240 mg via INTRAVENOUS
  Filled 2019-09-26: qty 24

## 2019-09-26 NOTE — Telephone Encounter (Signed)
Pt. Requesting to speak with a dietician about nutrition. TC placed to Ernestene Kiel (Nutrition)  Pt. Information given to [facility] to possibly  see Pt in infusion today.

## 2019-09-26 NOTE — Telephone Encounter (Signed)
Nutrition Assessment   Reason for Assessment:   Patient with questions.   ASSESSMENT:  43 year old female with metastatic melanoma with metastatic brain lesion.  Patient being treated with single agent nivolumab.    Patient with concerns regarding food options offered at cancer center and adjoining [facility]).  Patient very concerned about what she puts in body (food) and on body (lotions, etc).  Concerned about foods that she has consumed in the past may not be tolerated by GI tract one day then the next day be tolerated ok.  Feels frustrated.    Medications: reviewed   Labs: reviewed   Anthropometrics:   Height: 69 inches Weight: 152 lb Noted 149 lb recent weight gain BMI: 22  INTERVENTION:  Active listening provided to patient.   Appreciative of feedback/concerns from patient regarding food options at the cancer center and [facility] hospital.  Discussed healthy options for patient in infusion rooms that are available.   Encouraged patient to keep food diary to determine foods that cause distress RD available if needed in the future.     Next Visit: no follow-up, available if needed  Tahir Blank B. Zenia Resides, [place], [place] Registered Dietitian [phone] (pager)

## 2019-09-26 NOTE — Patient Instructions (Signed)
[place] Discharge Instructions for Patients Receiving Chemotherapy  Today you received the following chemotherapy agents: Nivolumab  To help prevent nausea and vomiting after your treatment, we encourage you to take your nausea medication as directed.    If you develop nausea and vomiting that is not controlled by your nausea medication, call the clinic.   BELOW ARE SYMPTOMS THAT SHOULD BE REPORTED IMMEDIATELY:  *FEVER GREATER THAN 100.5 F  *CHILLS WITH OR WITHOUT FEVER  NAUSEA AND VOMITING THAT IS NOT CONTROLLED WITH YOUR NAUSEA MEDICATION  *UNUSUAL SHORTNESS OF BREATH  *UNUSUAL BRUISING OR BLEEDING  TENDERNESS IN MOUTH AND THROAT WITH OR WITHOUT PRESENCE OF ULCERS  *URINARY PROBLEMS  *BOWEL PROBLEMS  UNUSUAL RASH Items with * indicate a potential emergency and should be followed up as soon as possible.  Feel free to call the clinic should you have any questions or concerns. The clinic phone number is [phone].  Please show the [place] at check-in to the Emergency Department and triage nurse.

## 2019-09-26 NOTE — Progress Notes (Signed)
[place] [phone]   [phone]  OFFICE PROGRESS NOTE  System, Pcp Not In No address on file  DIAGNOSIS: Metastatic high-grade neoplasm consistent with metastatic malignant melanoma based on the most recent pathology report from [facility] in October 2019, presented with large left left upper/left lower lobe mass with a hypermetabolic AP window lymph node as well as solitary metastatic brain lesion diagnosed in May 2019.  Biomarker Findings Microsatellite status - MS-Stable Tumor Mutational Burden - TMB-Intermediate (16 Muts/Mb) Genomic Findings For a complete list of the genes assayed, please refer to the Appendix. CD274 (PD-L1) amplification NRAS Q61R PDCD1LG2 (PD-L2) amplification MYC amplification EPHB1 amplification JAK2 amplification RB1 Q93*, T597* TERT promoter -146C>T TP53 C275W   PRIOR THERAPY: 1) left temporal craniotomy with resection of tumor with intraoperative stereotactic guidance for volumetric resection under the care of Dr. Annette Stable on 04/29/2018. 2) First-line treatment with immunotherapy with Keytruda 200 mg IV every 3 weeks.  First dose June 02, 2018.  Status post 3 cycles.  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 3) palliative radiotherapy to the left lung mass under the care of Dr. Lisbeth Renshaw completed on 08/24/2018. 4) Resuming her treatment again with Keytruda 200 mg IV every 3 weeks, first dose 09/08/2018.  Status post 2 cycles.  CURRENT THERAPY: Treatment with immunotherapy with ipilimumab 3 mg/KG and nivolumab 1 mg/KG every 3 weeks for the first 4 cycles followed by maintenance nivolumab 240 mg IV every 2 weeks.  First dose of this treatment October 26, 2018.  Status post 18 cycles.  Ipilimumab was discontinued after cycle #2 for the significant liver dysfunction.  Starting from cycle #3 the patient is on treatment with single agent nivolumab.  INTERVAL HISTORY:  Leslie [name] 43 y.o. female returns to the clinic today for follow-up visit.  The patient is feeling fine today with no concerning complaints except for intermittent heartburn.  She denied having any chest pain, shortness of breath, cough or hemoptysis.  She denied having any fever or chills.  She has no nausea, vomiting, diarrhea or constipation.  She denied having any headache or visual changes.  She is here today for evaluation before starting cycle #19 of her treatment.   MEDICAL HISTORY: Past Medical History:  Diagnosis Date  . 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 Migraines   . Yeast infection     ALLERGIES:  is allergic to diflucan [fluconazole].  MEDICATIONS:  Current Outpatient Medications  Medication Sig Dispense Refill  . AMERICAN GINSENG PO Take 1,500-3,000 mg by mouth daily. Depending on level of fatigue    . Amino Acids (L-CARNITINE) LIQD Take 2,000 mg by mouth daily. Liquid L-Carnitine 2000 mg    . Ascorbic Acid (LIQUID C 500) 500 MG/15ML LIQD Take 1,000 mg by mouth daily.    . Beta Glucan POWD 1 Dose by Does not apply route daily. Beta D Glucan 400 mg    . Calcium-Magnesium (CAL-MAG PO) Take 15 mLs by mouth daily. Marine Based Cal - Mag (500 mg Calcium / 250 mg Mag) 15 ml total    . Coenzyme Q10 (COQ-10) 100 MG CAPS Take 1 tablet by mouth daily.    Marland Kitchen CREATINE MONOHYDRATE PO Take 6 g by mouth daily. Creatine MonoHydrate 6 g (on training cycle)    . doxylamine, Sleep, (UNISOM) 25 MG tablet Take 12.5 mg by mouth at bedtime as needed for sleep.    . Glucosamine-Chondroit-Vit  C-Mn (GLUCOSAMINE 1500 COMPLEX PO) Take 1 tablet by mouth daily. Vegan CMO Glucosamine    . Hyaluronic Acid-Vitamin C (HYALURONIC ACID PO) Take 100 mg by mouth daily. Liquid Hyaluronic Acid 100 mg    . Iron-Folic Acid-Vit D78 (IRON FORMULA PO) Take 1 Dose by mouth daily. Iron 50 mg (1/2 before workout and 1/2 afterwards - cycle 1 wk off and on)    . Lysine POWD 1,600 mg by  Does not apply route daily. L-Lysine Powder 1600 mg    . Methylsulfonylmethane (MSM) 1000 MG CAPS Take 4 capsules by mouth daily. 4000 mg (1/2 before workout and 1/2 afterwards)    . Multiple Vitamin (MULTIVITAMIN) tablet Take 1 tablet by mouth daily.    . Multiple Vitamins-Minerals (ZINC PO) Take 1 tablet by mouth daily. Raw Zinc 30 mg (cycle every other week)    . NON FORMULARY Take 1 Dose by mouth daily. Cordyceps 750 mg    . NON FORMULARY Take 1 Dose by mouth daily. Liver Herbal Supplement 675 mg    . NON FORMULARY Take 1 Dose by mouth daily. Reishi Mushroom 500 mg    . NON FORMULARY Take 1 Dose by mouth daily. Curcumin Phytosome (Bioperrine) 3000 mg    . NON FORMULARY Take 1 tablet by mouth daily. Artemisnin 200 mg    . NON FORMULARY Take 1 tablet by mouth daily. Wellness Herbal Tablet as needed (Elderberry, Ashwagandha, etc)    . NON FORMULARY Take 1 tablet by mouth daily. Zinc L Carnosine Complex 75/59 mg (after workout)    . Omega 3-6-9 Fatty Acids (OMEGA-3-6-9 PO) Take 2 capsules by mouth daily. Plant Based Omega 3-6-9 2 capsules = 1825 mg    . QUERCETIN PO Take 1 tablet by mouth daily. Quercetin 400 mg / Bromelain 85 mg    . RESVERATROL PO Take 1,200 mg by mouth daily. Trans Resveratrol    . VITAMIN A PO Take 1 tablet by mouth daily. 1500 mcg (5000 IU)    . Vitamin D-Vitamin K (VITAMIN K2-VITAMIN D3 PO) Take 1 capsule by mouth daily. 5000 IU + 50 mcg    . VITAMIN E PO Take 1 tablet by mouth daily. Vegan Vitamin E     No current facility-administered medications for this visit.     SURGICAL HISTORY:  Past Surgical History:  Procedure Laterality Date  . APPLICATION OF CRANIAL NAVIGATION Left 04/29/2018   Procedure: APPLICATION OF CRANIAL NAVIGATION;  Surgeon: Earnie Larsson, MD;  Location: [place];  Service: Neurosurgery;  Laterality: Left;  . CRANIOTOMY Left 04/29/2018   Procedure: LEFT CRANIOTOMY FOR  TUMOR BRAIN LAB;  Surgeon: Earnie Larsson, MD;  Location: [place];  Service: Neurosurgery;   Laterality: Left;    REVIEW OF SYSTEMS:  A comprehensive review of systems was negative except for: Gastrointestinal: positive for dyspepsia   PHYSICAL EXAMINATION: General appearance: alert, cooperative and no distress Head: Normocephalic, without obvious abnormality, atraumatic Neck: no adenopathy, no JVD, supple, symmetrical, trachea midline and thyroid not enlarged, symmetric, no tenderness/mass/nodules Lymph nodes: Cervical, supraclavicular, and axillary nodes normal. Resp: clear to auscultation bilaterally Back: negative, symmetric, no curvature. ROM normal. No CVA tenderness. Cardio: regular rate and rhythm, S1, S2 normal, no murmur, click, rub or gallop GI: soft, non-tender; bowel sounds normal; no masses,  no organomegaly Extremities: extremities normal, atraumatic, no cyanosis or edema  ECOG PERFORMANCE STATUS: 0 - Asymptomatic  Blood pressure 117/72, pulse 100, temperature 98.7 F (37.1 C), temperature source Temporal, resp. rate 18, height 5' 9"  (  1.753 m), weight 152 lb 6.4 oz (69.1 kg), SpO2 100 %, unknown if currently breastfeeding.  LABORATORY DATA: Lab Results  Component Value Date   WBC 3.9 (L) 09/26/2019   HGB 13.8 09/26/2019   HCT 40.1 09/26/2019   MCV 89.3 09/26/2019   PLT 244 09/26/2019      Chemistry      Component Value Date/Time   NA 139 09/12/2019 0828   K 4.0 09/12/2019 0828   CL 108 09/12/2019 0828   CO2 22 09/12/2019 0828   BUN 12 09/12/2019 0828   CREATININE 1.00 09/12/2019 0828      Component Value Date/Time   CALCIUM 8.8 (L) 09/12/2019 0828   ALKPHOS 86 09/12/2019 0828   AST 38 09/12/2019 0828   ALT 32 09/12/2019 0828   BILITOT 0.5 09/12/2019 0828       RADIOGRAPHIC STUDIES: No results found.  ASSESSMENT AND PLAN: This is a very pleasant 43 years old white female with highly suspicious metastatic malignant melanoma presented with large mass in the left upper/left lower lobe and mediastinal lymphadenopathy as well as solitary brain  metastasis status post left temporal craniotomy and resection of tumor on 04/29/2018 and she is recovering well from her surgery. The patient completed stereotactic radiotherapy to the resection cavity next week under the care of Dr. Lisbeth Renshaw. The patient underwent treatment with immunotherapy with Keytruda 200 mg IV every 3 weeks status post 3 cycles.   Her scan after cycle #3 showed enlargement of the left upper lobe lung mass.  This was also suspicious for pseudo-progression on immunotherapy.  The patient was started on a palliative course of radiotherapy to the left upper lobe lung mass and she tolerated this treatment fairly well.  She was seen by Dr. Emelda Brothers at [facility] and she recommended for the patient to resume her treatment with [facility] for now until she undergoes further molecular studies.  The patient was treated with 2 more cycles of Keytruda and tolerated the treatment well. Her recent CT scan of the chest, abdomen and pelvis showed some improvement in the left upper lobe lung mass but there is still concern about pericardial invasion. I personally and independently reviewed the scan images and discussed the result and showed the images to the patient and her boyfriend today.  She is still not a good candidate for surgical resection because of the pericardial invasion. The final pathology report and recommendation from [facility] was consistent with metastatic melanoma and the recommendation is to switch the patient to a combination immunotherapy with Ipilumumab and nivolumab. The patient is currently undergoing treatment with immunotherapy with ipilimumab and nivolumab status post 2 cycles.  Her treatment was on hold for more than 6 weeks secondary to grade 3 hepatic dysfunction.  She had improvement of her liver enzyme after a prolonged treatment with a steroid with a tapering schedule. She resumed her treatment again with single agent nivolumab.  She is status post  18 cycles of treatment. The patient has been tolerating this treatment well with no concerning adverse effects. I recommended for her to proceed with cycle #19 today as planned. We will see her back for follow-up visit in 2 weeks for evaluation before starting cycle #20.  For the dyspepsia and nutritional recommendation, she will see the dietitian at the cancer center for evaluation. She was advised to call immediately if she has any concerning symptoms in the interval. The patient voices understanding of current disease status and treatment options and is in agreement with  the current care plan. All questions were answered. The patient knows to call the clinic with any problems, questions or concerns. We can certainly see the patient much sooner if necessary.  Disclaimer: This note was dictated with voice recognition software. Similar sounding words can inadvertently be transcribed and may not be corrected upon review.

## 2019-09-27 DIAGNOSIS — C7931 Secondary malignant neoplasm of brain: Secondary | ICD-10-CM | POA: Diagnosis not present

## 2019-09-29 ENCOUNTER — Other Ambulatory Visit: Payer: Self-pay | Admitting: Radiation Therapy

## 2019-09-29 DIAGNOSIS — C7931 Secondary malignant neoplasm of brain: Secondary | ICD-10-CM

## 2019-09-29 DIAGNOSIS — C7949 Secondary malignant neoplasm of other parts of nervous system: Secondary | ICD-10-CM

## 2019-10-02 DIAGNOSIS — M9901 Segmental and somatic dysfunction of cervical region: Secondary | ICD-10-CM | POA: Diagnosis not present

## 2019-10-02 DIAGNOSIS — M2560 Stiffness of unspecified joint, not elsewhere classified: Secondary | ICD-10-CM | POA: Diagnosis not present

## 2019-10-02 DIAGNOSIS — M62838 Other muscle spasm: Secondary | ICD-10-CM | POA: Diagnosis not present

## 2019-10-02 DIAGNOSIS — M9907 Segmental and somatic dysfunction of upper extremity: Secondary | ICD-10-CM | POA: Diagnosis not present

## 2019-10-02 DIAGNOSIS — M9902 Segmental and somatic dysfunction of thoracic region: Secondary | ICD-10-CM | POA: Diagnosis not present

## 2019-10-02 DIAGNOSIS — M9903 Segmental and somatic dysfunction of lumbar region: Secondary | ICD-10-CM | POA: Diagnosis not present

## 2019-10-03 ENCOUNTER — Other Ambulatory Visit: Payer: Self-pay | Admitting: Radiation Therapy

## 2019-10-10 ENCOUNTER — Telehealth: Payer: Self-pay | Admitting: Internal Medicine

## 2019-10-10 ENCOUNTER — Inpatient Hospital Stay (HOSPITAL_BASED_OUTPATIENT_CLINIC_OR_DEPARTMENT_OTHER): Payer: BC Managed Care – PPO | Admitting: Internal Medicine

## 2019-10-10 ENCOUNTER — Other Ambulatory Visit: Payer: Self-pay

## 2019-10-10 ENCOUNTER — Inpatient Hospital Stay: Payer: BC Managed Care – PPO

## 2019-10-10 ENCOUNTER — Encounter: Payer: Self-pay | Admitting: Internal Medicine

## 2019-10-10 VITALS — BP 125/72 | HR 68 | Temp 98.0°F | Resp 18 | Ht 69.0 in | Wt 146.8 lb

## 2019-10-10 DIAGNOSIS — C3432 Malignant neoplasm of lower lobe, left bronchus or lung: Secondary | ICD-10-CM

## 2019-10-10 DIAGNOSIS — C7802 Secondary malignant neoplasm of left lung: Secondary | ICD-10-CM

## 2019-10-10 DIAGNOSIS — R1013 Epigastric pain: Secondary | ICD-10-CM | POA: Diagnosis not present

## 2019-10-10 DIAGNOSIS — C7931 Secondary malignant neoplasm of brain: Secondary | ICD-10-CM | POA: Diagnosis not present

## 2019-10-10 DIAGNOSIS — Z5112 Encounter for antineoplastic immunotherapy: Secondary | ICD-10-CM

## 2019-10-10 DIAGNOSIS — Z79899 Other long term (current) drug therapy: Secondary | ICD-10-CM | POA: Diagnosis not present

## 2019-10-10 DIAGNOSIS — C439 Malignant melanoma of skin, unspecified: Secondary | ICD-10-CM | POA: Diagnosis not present

## 2019-10-10 LAB — CMP (CANCER CENTER ONLY)
ALT: 33 U/L (ref 0–44)
AST: 42 U/L — ABNORMAL HIGH (ref 15–41)
Albumin: 4.3 g/dL (ref 3.5–5.0)
Alkaline Phosphatase: 91 U/L (ref 38–126)
Anion gap: 8 (ref 5–15)
BUN: 11 mg/dL (ref 6–20)
CO2: 25 mmol/L (ref 22–32)
Calcium: 9.4 mg/dL (ref 8.9–10.3)
Chloride: 107 mmol/L (ref 98–111)
Creatinine: 0.99 mg/dL (ref 0.44–1.00)
GFR, Est AFR Am: >60 mL/min (ref >60)
GFR, Estimated: >60 mL/min (ref >60)
Glucose, Bld: 77 mg/dL (ref 70–99)
Potassium: 4.1 mmol/L (ref 3.5–5.1)
Sodium: 140 mmol/L (ref 135–145)
Total Bilirubin: 0.4 mg/dL (ref 0.3–1.2)
Total Protein: 7.2 g/dL (ref 6.5–8.1)

## 2019-10-10 LAB — CBC WITH DIFFERENTIAL (CANCER CENTER ONLY)
Abs Immature Granulocytes: 0.01 10*3/uL (ref 0.00–0.07)
Basophils Absolute: 0 10*3/uL (ref 0.0–0.1)
Basophils Relative: 1 %
Eosinophils Absolute: 0.1 10*3/uL (ref 0.0–0.5)
Eosinophils Relative: 2 %
HCT: 42 % (ref 36.0–46.0)
Hemoglobin: 14.3 g/dL (ref 12.0–15.0)
Immature Granulocytes: 0 %
Lymphocytes Relative: 19 %
Lymphs Abs: 0.6 10*3/uL — ABNORMAL LOW (ref 0.7–4.0)
MCH: 31 pg (ref 26.0–34.0)
MCHC: 34 g/dL (ref 30.0–36.0)
MCV: 90.9 fL (ref 80.0–100.0)
Monocytes Absolute: 0.4 10*3/uL (ref 0.1–1.0)
Monocytes Relative: 11 %
Neutro Abs: 2.3 10*3/uL (ref 1.7–7.7)
Neutrophils Relative %: 67 %
Platelet Count: 212 10*3/uL (ref 150–400)
RBC: 4.62 MIL/uL (ref 3.87–5.11)
RDW: 12.8 % (ref 11.5–15.5)
WBC Count: 3.4 10*3/uL — ABNORMAL LOW (ref 4.0–10.5)
nRBC: 0 % (ref 0.0–0.2)

## 2019-10-10 LAB — TSH: TSH: 2.505 u[IU]/mL (ref 0.308–3.960)

## 2019-10-10 MED ORDER — SODIUM CHLORIDE 0.9 % IV SOLN
Freq: Once | INTRAVENOUS | Status: AC
  Administered 2019-10-10: 12:00:00 via INTRAVENOUS
  Filled 2019-10-10: qty 250

## 2019-10-10 MED ORDER — SODIUM CHLORIDE 0.9 % IV SOLN
240.0000 mg | Freq: Once | INTRAVENOUS | Status: AC
  Administered 2019-10-10: 240 mg via INTRAVENOUS
  Filled 2019-10-10: qty 24

## 2019-10-10 NOTE — Progress Notes (Signed)
[place] Telephone:(336) [phone]   [phone]  OFFICE PROGRESS NOTE  System, Pcp Not In No address on file  DIAGNOSIS: Metastatic high-grade neoplasm consistent with metastatic malignant melanoma based on the most recent pathology report from [facility] in October 2019, presented with large left left upper/left lower lobe mass with a hypermetabolic AP window lymph node as well as solitary metastatic brain lesion diagnosed in May 2019.  Biomarker Findings Microsatellite status - MS-Stable Tumor Mutational Burden - TMB-Intermediate (16 Muts/Mb) Genomic Findings For a complete list of the genes assayed, please refer to the Appendix. CD274 (PD-L1) amplification NRAS Q61R PDCD1LG2 (PD-L2) amplification MYC amplification EPHB1 amplification JAK2 amplification RB1 Q93*, L244* TERT promoter -146C>T TP53 C275W   PRIOR THERAPY: 1) left temporal craniotomy with resection of tumor with intraoperative stereotactic guidance for volumetric resection under the care of Dr. Annette Stable on 04/29/2018. 2) First-line treatment with immunotherapy with Keytruda 200 mg IV every 3 weeks.  First dose June 02, 2018.  Status post 3 cycles.  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 3) palliative radiotherapy to the left lung mass under the care of Dr. Lisbeth Renshaw completed on 08/24/2018. 4) Resuming her treatment again with Keytruda 200 mg IV every 3 weeks, first dose 09/08/2018.  Status post 2 cycles.  CURRENT THERAPY: Treatment with immunotherapy with ipilimumab 3 mg/KG and nivolumab 1 mg/KG every 3 weeks for the first 4 cycles followed by maintenance nivolumab 240 mg IV every 2 weeks.  First dose of this treatment October 26, 2018.  Status post 19 cycles.  Ipilimumab was discontinued after cycle #2 for the significant liver dysfunction.  Starting from cycle #3 the patient is on treatment with single agent nivolumab.  INTERVAL HISTORY:  Leslie [name] 43 y.o. female returns to the clinic today for follow-up visit.  The patient is feeling fine today with no concerning complaints except for occasional abdominal pain.  She denied having any chest pain, shortness of breath, cough or hemoptysis.  She denied having any fever or chills.  She has no nausea, vomiting, diarrhea or constipation.  She denied having any headache or visual changes.  She continues to tolerate her treatment with nivolumab fairly well.  The patient is here today for evaluation before starting cycle #20.  MEDICAL HISTORY: Past Medical History:  Diagnosis Date  . 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 Migraines   . Yeast infection     ALLERGIES:  is allergic to diflucan [fluconazole].  MEDICATIONS:  Current Outpatient Medications  Medication Sig Dispense Refill  . AMERICAN GINSENG PO Take 1,500-3,000 mg by mouth daily. Depending on level of fatigue    . Amino Acids (L-CARNITINE) LIQD Take 2,000 mg by mouth daily. Liquid L-Carnitine 2000 mg    . Ascorbic Acid (LIQUID C 500) 500 MG/15ML LIQD Take 1,000 mg by mouth daily.    . Beta Glucan POWD 1 Dose by Does not apply route daily. Beta D Glucan 400 mg    . Calcium-Magnesium (CAL-MAG PO) Take 15 mLs by mouth daily. Marine Based Cal - Mag (500 mg Calcium / 250 mg Mag) 15 ml total    . Coenzyme Q10 (COQ-10) 100 MG CAPS Take 1 tablet by mouth daily.    Marland Kitchen CREATINE MONOHYDRATE PO Take 6 g by mouth daily. Creatine MonoHydrate 6 g (on training cycle)    . doxylamine, Sleep, (UNISOM) 25 MG tablet Take 12.5 mg by mouth at bedtime  as needed for sleep.    . Glucosamine-Chondroit-Vit C-Mn (GLUCOSAMINE 1500 COMPLEX PO) Take 1 tablet by mouth daily. Vegan CMO Glucosamine    . Hyaluronic Acid-Vitamin C (HYALURONIC ACID PO) Take 100 mg by mouth daily. Liquid Hyaluronic Acid 100 mg    . Iron-Folic Acid-Vit I77 (IRON FORMULA PO) Take 1 Dose by mouth daily. Iron 50 mg (1/2 before workout and 1/2  afterwards - cycle 1 wk off and on)    . Lysine POWD 1,600 mg by Does not apply route daily. L-Lysine Powder 1600 mg    . Methylsulfonylmethane (MSM) 1000 MG CAPS Take 4 capsules by mouth daily. 4000 mg (1/2 before workout and 1/2 afterwards)    . Multiple Vitamin (MULTIVITAMIN) tablet Take 1 tablet by mouth daily.    . Multiple Vitamins-Minerals (ZINC PO) Take 1 tablet by mouth daily. Raw Zinc 30 mg (cycle every other week)    . NON FORMULARY Take 1 Dose by mouth daily. Cordyceps 750 mg    . NON FORMULARY Take 1 Dose by mouth daily. Liver Herbal Supplement 675 mg    . NON FORMULARY Take 1 Dose by mouth daily. Reishi Mushroom 500 mg    . NON FORMULARY Take 1 Dose by mouth daily. Curcumin Phytosome (Bioperrine) 3000 mg    . NON FORMULARY Take 1 tablet by mouth daily. Artemisnin 200 mg    . NON FORMULARY Take 1 tablet by mouth daily. Wellness Herbal Tablet as needed (Elderberry, Ashwagandha, etc)    . NON FORMULARY Take 1 tablet by mouth daily. Zinc L Carnosine Complex 75/59 mg (after workout)    . Omega 3-6-9 Fatty Acids (OMEGA-3-6-9 PO) Take 2 capsules by mouth daily. Plant Based Omega 3-6-9 2 capsules = 1825 mg    . QUERCETIN PO Take 1 tablet by mouth daily. Quercetin 400 mg / Bromelain 85 mg    . RESVERATROL PO Take 1,200 mg by mouth daily. Trans Resveratrol    . VITAMIN A PO Take 1 tablet by mouth daily. 1500 mcg (5000 IU)    . Vitamin D-Vitamin K (VITAMIN K2-VITAMIN D3 PO) Take 1 capsule by mouth daily. 5000 IU + 50 mcg    . VITAMIN E PO Take 1 tablet by mouth daily. Vegan Vitamin E     No current facility-administered medications for this visit.     SURGICAL HISTORY:  Past Surgical History:  Procedure Laterality Date  . APPLICATION OF CRANIAL NAVIGATION Left 04/29/2018   Procedure: APPLICATION OF CRANIAL NAVIGATION;  Surgeon: Earnie Larsson, MD;  Location: [place];  Service: Neurosurgery;  Laterality: Left;  . CRANIOTOMY Left 04/29/2018   Procedure: LEFT CRANIOTOMY FOR  TUMOR BRAIN LAB;   Surgeon: Earnie Larsson, MD;  Location: [place];  Service: Neurosurgery;  Laterality: Left;    REVIEW OF SYSTEMS:  A comprehensive review of systems was negative except for: Gastrointestinal: positive for dyspepsia   PHYSICAL EXAMINATION: General appearance: alert, cooperative and no distress Head: Normocephalic, without obvious abnormality, atraumatic Neck: no adenopathy, no JVD, supple, symmetrical, trachea midline and thyroid not enlarged, symmetric, no tenderness/mass/nodules Lymph nodes: Cervical, supraclavicular, and axillary nodes normal. Resp: clear to auscultation bilaterally Back: negative, symmetric, no curvature. ROM normal. No CVA tenderness. Cardio: regular rate and rhythm, S1, S2 normal, no murmur, click, rub or gallop GI: soft, non-tender; bowel sounds normal; no masses,  no organomegaly Extremities: extremities normal, atraumatic, no cyanosis or edema  ECOG PERFORMANCE STATUS: 0 - Asymptomatic  Blood pressure 125/72, pulse 68, temperature 98 F (36.7 C),  temperature source Temporal, resp. rate 18, height _0  (1.753 m), weight 146 lb 12.8 oz (66.6 kg), SpO2 100 %, unknown if currently breastfeeding.  LABORATORY DATA: Lab Results  Component Value Date   WBC 3.4 (L) 10/10/2019   HGB 14.3 10/10/2019   HCT 42.0 10/10/2019   MCV 90.9 10/10/2019   PLT 212 10/10/2019      Chemistry      Component Value Date/Time   NA 140 10/10/2019 1011   K 4.1 10/10/2019 1011   CL 107 10/10/2019 1011   CO2 25 10/10/2019 1011   BUN 11 10/10/2019 1011   CREATININE 0.99 10/10/2019 1011      Component Value Date/Time   CALCIUM 9.4 10/10/2019 1011   ALKPHOS 91 10/10/2019 1011   AST 42 (H) 10/10/2019 1011   ALT 33 10/10/2019 1011   BILITOT 0.4 10/10/2019 1011       RADIOGRAPHIC STUDIES: No results found.  ASSESSMENT AND PLAN: This is a very pleasant 43 years old white female with highly suspicious metastatic malignant melanoma presented with large mass in the left upper/left  lower lobe and mediastinal lymphadenopathy as well as solitary brain metastasis status post left temporal craniotomy and resection of tumor on 04/29/2018 and she is recovering well from her surgery. The patient completed stereotactic radiotherapy to the resection cavity next week under the care of Dr. Lisbeth Renshaw. The patient underwent treatment with immunotherapy with Keytruda 200 mg IV every 3 weeks status post 3 cycles.   Her scan after cycle #3 showed enlargement of the left upper lobe lung mass.  This was also suspicious for pseudo-progression on immunotherapy.  The patient was started on a palliative course of radiotherapy to the left upper lobe lung mass and she tolerated this treatment fairly well.  She was seen by Dr. Emelda Brothers at [facility] and she recommended for the patient to resume her treatment with [facility] for now until she undergoes further molecular studies.  The patient was treated with 2 more cycles of Keytruda and tolerated the treatment well. Her recent CT scan of the chest, abdomen and pelvis showed some improvement in the left upper lobe lung mass but there is still concern about pericardial invasion. I personally and independently reviewed the scan images and discussed the result and showed the images to the patient and her boyfriend today.  She is still not a good candidate for surgical resection because of the pericardial invasion. The final pathology report and recommendation from [facility] was consistent with metastatic melanoma and the recommendation is to switch the patient to a combination immunotherapy with Ipilumumab and nivolumab. The patient is currently undergoing treatment with immunotherapy with ipilimumab and nivolumab status post 2 cycles.  Her treatment was on hold for more than 6 weeks secondary to grade 3 hepatic dysfunction.  She had improvement of her liver enzyme after a prolonged treatment with a steroid with a tapering schedule. She resumed  her treatment again with single agent nivolumab.  She is status post 19 cycles of treatment. The patient continues to tolerate her treatment well with no concerning complaints. I recommended for her to proceed with cycle #20 today as planned. I will see her back for follow-up visit in 2 weeks for evaluation after repeating PET scan for further evaluation of her disease and to monitor the FDG activity in the residual lung mass and also to rule out any other metastatic disease. The patient was advised to call immediately if she has any concerning symptoms in the  interval. The patient voices understanding of current disease status and treatment options and is in agreement with the current care plan. All questions were answered. The patient knows to call the clinic with any problems, questions or concerns. We can certainly see the patient much sooner if necessary.  Disclaimer: This note was dictated with voice recognition software. Similar sounding words can inadvertently be transcribed and may not be corrected upon review.

## 2019-10-10 NOTE — Telephone Encounter (Signed)
Per 10/27 los appt already scheduled.

## 2019-10-10 NOTE — Patient Instructions (Signed)
[facility] Discharge Instructions for Patients Receiving Chemotherapy  Today you received the following chemotherapy agents: NIVOLUMAB.  To help prevent nausea and vomiting after your treatment, we encourage you to take your nausea medication as directed.   If you develop nausea and vomiting that is not controlled by your nausea medication, call the clinic.   BELOW ARE SYMPTOMS THAT SHOULD BE REPORTED IMMEDIATELY:  *FEVER GREATER THAN 100.5 F  *CHILLS WITH OR WITHOUT FEVER  NAUSEA AND VOMITING THAT IS NOT CONTROLLED WITH YOUR NAUSEA MEDICATION  *UNUSUAL SHORTNESS OF BREATH  *UNUSUAL BRUISING OR BLEEDING  TENDERNESS IN MOUTH AND THROAT WITH OR WITHOUT PRESENCE OF ULCERS  *URINARY PROBLEMS  *BOWEL PROBLEMS  UNUSUAL RASH Items with * indicate a potential emergency and should be followed up as soon as possible.  Feel free to call the clinic should you have any questions or concerns. The clinic phone number is [phone].  Please show the [place] at check-in to the Emergency Department and triage nurse.

## 2019-10-19 ENCOUNTER — Other Ambulatory Visit: Payer: Self-pay

## 2019-10-19 ENCOUNTER — Ambulatory Visit
Admission: RE | Admit: 2019-10-19 | Discharge: 2019-10-19 | Disposition: A | Discharge status: Home / Self Care | Payer: BC Managed Care – PPO | Source: Ambulatory Visit | Attending: Radiation Oncology | Admitting: Radiation Oncology

## 2019-10-19 DIAGNOSIS — C7931 Secondary malignant neoplasm of brain: Secondary | ICD-10-CM

## 2019-10-19 DIAGNOSIS — G9389 Other specified disorders of brain: Secondary | ICD-10-CM | POA: Diagnosis not present

## 2019-10-19 DIAGNOSIS — C7949 Secondary malignant neoplasm of other parts of nervous system: Secondary | ICD-10-CM

## 2019-10-19 MED ORDER — GADOBENATE DIMEGLUMINE 529 MG/ML IV SOLN
13.0000 mL | Freq: Once | INTRAVENOUS | Status: AC | PRN
  Administered 2019-10-19: 13 mL via INTRAVENOUS

## 2019-10-20 ENCOUNTER — Ambulatory Visit (HOSPITAL_COMMUNITY)
Admission: RE | Admit: 2019-10-20 | Discharge: 2019-10-20 | Disposition: A | Discharge status: Home / Self Care | Payer: BC Managed Care – PPO | Source: Ambulatory Visit | Attending: Internal Medicine | Admitting: Internal Medicine

## 2019-10-20 DIAGNOSIS — C3432 Malignant neoplasm of lower lobe, left bronchus or lung: Secondary | ICD-10-CM | POA: Diagnosis not present

## 2019-10-20 DIAGNOSIS — C439 Malignant melanoma of skin, unspecified: Secondary | ICD-10-CM | POA: Diagnosis not present

## 2019-10-20 LAB — GLUCOSE, CAPILLARY: Glucose-Capillary: 84 mg/dL (ref 70–99)

## 2019-10-20 MED ORDER — FLUDEOXYGLUCOSE F - 18 (FDG) INJECTION
7.3000 | Freq: Once | INTRAVENOUS | Status: AC | PRN
  Administered 2019-10-20: 7.3 via INTRAVENOUS

## 2019-10-23 ENCOUNTER — Ambulatory Visit
Admission: RE | Admit: 2019-10-23 | Discharge: 2019-10-23 | Disposition: A | Discharge status: Home / Self Care | Payer: BC Managed Care – PPO | Source: Ambulatory Visit | Attending: Radiation Oncology | Admitting: Radiation Oncology

## 2019-10-23 ENCOUNTER — Other Ambulatory Visit: Payer: Self-pay

## 2019-10-23 ENCOUNTER — Inpatient Hospital Stay: Payer: BC Managed Care – PPO | Attending: Physician Assistant

## 2019-10-23 DIAGNOSIS — C439 Malignant melanoma of skin, unspecified: Secondary | ICD-10-CM | POA: Insufficient documentation

## 2019-10-23 DIAGNOSIS — Z79899 Other long term (current) drug therapy: Secondary | ICD-10-CM | POA: Insufficient documentation

## 2019-10-23 DIAGNOSIS — Z08 Encounter for follow-up examination after completed treatment for malignant neoplasm: Secondary | ICD-10-CM | POA: Diagnosis not present

## 2019-10-23 DIAGNOSIS — C7802 Secondary malignant neoplasm of left lung: Secondary | ICD-10-CM

## 2019-10-23 DIAGNOSIS — C7931 Secondary malignant neoplasm of brain: Secondary | ICD-10-CM | POA: Diagnosis not present

## 2019-10-23 DIAGNOSIS — Z5112 Encounter for antineoplastic immunotherapy: Secondary | ICD-10-CM | POA: Insufficient documentation

## 2019-10-23 NOTE — Progress Notes (Signed)
Radiation Oncology         [phone] ________________________________  Outpatient Follow Up - Conducted via telephone due to current COVID-19 concerns for limiting patient exposure  I spoke with the patient to conduct this consult visit via telephone to spare the patient unnecessary potential exposure in the healthcare setting during the current COVID-19 pandemic. The patient was notified in advance and was offered a [place] meeting to allow for face to face communication but unfortunately reported that they did not have the appropriate resources/technology to support such a visit and instead preferred to proceed with a telephone visit.   _______________________________  Name: Leslie [name] MRN: [id number]  Date of Service: 10/23/2019 DOB: 09/07/1976  Follow Up Note  CC: System, Pcp Not In  No ref. 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 In summary this is a pleasant 43 y.o. woman who initially presented in the summer of 2019 with a solitary brain tumor who underwent surgical resection leading to a diagnosis of high grade carcinoma. She received postoperative SRS to the site. She was found to have a large lung tumor and we initially considered palliative radiotherapy versus surgery, but the decision was to proceed with systemic therapy with Keytruda as her path was reviewed by several facilities and felt to be most consistent with melanoma. She received 3 cycles of treatment and repeat imaging on 08/01/18 revealed progression of her left lung mass to measure 8 x 5.6 cm (previously  5.6 x 3.9 cm). She did not have any additional areas of progression. She went on to receive palliative radiotherapy to the left lung mass in late summer 2019   She was seen at [facility] by Dr. Emelda Brothers, and who recommended doublet immunotherapy. She has continued with single agent opdivo since February 2020 due to heptatic dysfunction early on with the doublet therapy. She had an MRI of the brain on 10/19/2019 revealing prior postop changes in the left anerior temporal lobe and 2 small enhancing nodules in the left inferior and medial temporal lobe one in retrospect was present on the last scan and is 1.5 mm now and the second is new measuring 2 x 5 mm. No other areas of disease were noted. She also had a PET scan on 10/20/2019 shows low activity in the rim of the left lung mass unchnaged overall in size measureing 4.1 x 2.7 cm, and SUV was 2.3. Blood pool was 1.7. She has a small amount of consolidation measuring 1.5 cm in the LLL, and no hypermetabolic adenopathy in the chest. There is a small amount of metabolic activity in the thymus without other concerns for active disease. She sees Dr. Julien Nordmann tomorrow to review.   On review of systems, the patient reports that she is doing well overall. She denies any chest pain, shortness of breath, cough, fevers, chills, night sweats, unintended weight changes. She denies any bowel or bladder disturbances, and denies abdominal pain, nausea or vomiting. She denies any new musculoskeletal or joint aches or pains, new skin lesions or concerns. A complete review of systems is obtained and is otherwise  negative.   Past Medical History:  Past Medical History:  Diagnosis Date  . 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 Migraines   . Yeast infection     Past Surgical History: Past Surgical History:  Procedure Laterality Date  . APPLICATION OF CRANIAL NAVIGATION Left 04/29/2018   Procedure: APPLICATION OF CRANIAL NAVIGATION;   Surgeon: Earnie Larsson, MD;  Location: [place];  Service: Neurosurgery;  Laterality: Left;  . CRANIOTOMY Left 04/29/2018   Procedure: LEFT CRANIOTOMY FOR  TUMOR BRAIN LAB;  Surgeon: Earnie Larsson, MD;  Location: [place];  Service: Neurosurgery;  Laterality: Left;    Social History:  Social History   Socioeconomic History  . Marital status: Divorced    Spouse name: Not on file  . Number of children: Not on file  . Years of education: Not on file  . Highest education level: Not on file  Occupational History  . Not on file  Social Needs  . Financial resource strain: Not on file  . Food insecurity    Worry: Not on file    Inability: Not on file  . Transportation needs    Medical: Not on file    Non-medical: Not on file  Tobacco Use  . Smoking status: Never Smoker  . Smokeless tobacco: Never Used  Substance and Sexual Activity  . Alcohol use: Yes  . Drug use: No  . Sexual activity: Yes    Birth control/protection: Condom  Lifestyle  . Physical activity    Days per week: Not on file    Minutes per session: Not on file  . Stress: Not on file  Relationships  . Social Herbalist on phone: Not on file    Gets together: Not on file    Attends religious service: Not on file    Active member of club or organization: Not on file    Attends meetings of clubs or organizations: Not on file    Relationship status: Not on file  . Intimate partner violence    Fear of current or ex partner: No    Emotionally abused: No    Physically abused: No    Forced sexual activity: No  Other Topics Concern  . Not on file  Social History Narrative  . Not on file  The patient is divorced but in a relationship. She has teenage children. 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 She is very physically active regularly.  Family History: Family History  Problem Relation Age of Onset  . Cancer Father        lung    ALLERGIES:  is allergic to diflucan  [fluconazole].  Meds: Current Outpatient Medications  Medication Sig Dispense Refill  . AMERICAN GINSENG PO Take 1,500-3,000 mg by mouth daily. Depending on level of fatigue    . Amino Acids (L-CARNITINE) LIQD Take 2,000 mg by mouth daily. Liquid L-Carnitine 2000 mg    . Ascorbic Acid (LIQUID C 500) 500 MG/15ML LIQD Take 1,000 mg by mouth daily.    . Beta Glucan POWD 1 Dose by Does not apply route daily. Beta D Glucan 400 mg    . Calcium-Magnesium (CAL-MAG PO) Take 15 mLs by mouth daily. Marine Based Cal - Mag (500 mg Calcium / 250 mg Mag) 15 ml total    . Coenzyme Q10 (COQ-10) 100 MG CAPS Take 1 tablet by mouth daily.    Marland Kitchen CREATINE MONOHYDRATE PO  Take 6 g by mouth daily. Creatine MonoHydrate 6 g (on training cycle)    . doxylamine, Sleep, (UNISOM) 25 MG tablet Take 12.5 mg by mouth at bedtime as needed for sleep.    . Glucosamine-Chondroit-Vit C-Mn (GLUCOSAMINE 1500 COMPLEX PO) Take 1 tablet by mouth daily. Vegan CMO Glucosamine    . Hyaluronic Acid-Vitamin C (HYALURONIC ACID PO) Take 100 mg by mouth daily. Liquid Hyaluronic Acid 100 mg    . Iron-Folic Acid-Vit P37 (IRON FORMULA PO) Take 1 Dose by mouth daily. Iron 50 mg (1/2 before workout and 1/2 afterwards - cycle 1 wk off and on)    . Lysine POWD 1,600 mg by Does not apply route daily. L-Lysine Powder 1600 mg    . Methylsulfonylmethane (MSM) 1000 MG CAPS Take 4 capsules by mouth daily. 4000 mg (1/2 before workout and 1/2 afterwards)    . Multiple Vitamin (MULTIVITAMIN) tablet Take 1 tablet by mouth daily.    . Multiple Vitamins-Minerals (ZINC PO) Take 1 tablet by mouth daily. Raw Zinc 30 mg (cycle every other week)    . NON FORMULARY Take 1 Dose by mouth daily. Cordyceps 750 mg    . NON FORMULARY Take 1 Dose by mouth daily. Liver Herbal Supplement 675 mg    . NON FORMULARY Take 1 Dose by mouth daily. Reishi Mushroom 500 mg    . NON FORMULARY Take 1 Dose by mouth daily. Curcumin Phytosome (Bioperrine) 3000 mg    . NON FORMULARY Take 1  tablet by mouth daily. Artemisnin 200 mg    . NON FORMULARY Take 1 tablet by mouth daily. Wellness Herbal Tablet as needed (Elderberry, Ashwagandha, etc)    . NON FORMULARY Take 1 tablet by mouth daily. Zinc L Carnosine Complex 75/59 mg (after workout)    . Omega 3-6-9 Fatty Acids (OMEGA-3-6-9 PO) Take 2 capsules by mouth daily. Plant Based Omega 3-6-9 2 capsules = 1825 mg    . QUERCETIN PO Take 1 tablet by mouth daily. Quercetin 400 mg / Bromelain 85 mg    . RESVERATROL PO Take 1,200 mg by mouth daily. Trans Resveratrol    . VITAMIN A PO Take 1 tablet by mouth daily. 1500 mcg (5000 IU)    . Vitamin D-Vitamin K (VITAMIN K2-VITAMIN D3 PO) Take 1 capsule by mouth daily. 5000 IU + 50 mcg    . VITAMIN E PO Take 1 tablet by mouth daily. Vegan Vitamin E     No current facility-administered medications for this encounter.     Physical Findings: Unable to assess due to encounter type.  Lab Findings: Lab Results  Component Value Date   WBC 3.4 (L) 10/10/2019   HGB 14.3 10/10/2019   HCT 42.0 10/10/2019   MCV 90.9 10/10/2019   PLT 212 10/10/2019     Radiographic Findings: Mr Jeri Cos TK Contrast  Result Date: 10/20/2019 CLINICAL DATA:  History of metastatic disease to brain. Left temporal resection and radiation. History of melanoma. EXAM: MRI HEAD WITHOUT AND WITH CONTRAST TECHNIQUE: Multiplanar, multiecho pulse sequences of the brain and surrounding structures were obtained without and with intravenous contrast. CONTRAST:  60m MULTIHANCE GADOBENATE DIMEGLUMINE 529 MG/ML IV SOLN COMPARISON:  MRI head 06/22/2019, 03/17/2019 FINDINGS: Brain: Left temporal lobe resection. There is encephalomalacia in the left anterior temporal lobe with adjacent white matter hyperintensity on T2 and FLAIR which appears stable. Progression of 2 small enhancing nodules in the left inferior and medial temporal lobe. One of these in retrospect was present previously and is  larger now measuring 1.5 mm. The second is new  measuring approximately 2 x 5 mm. No new areas of remote metastatic disease identified. Ventricle size normal. Few small white matter hyperintensities stable. Vascular: Normal arterial flow voids Skull and upper cervical spine: No focal bony lesion. Sinuses/Orbits: Mild mucosal edema paranasal sinuses.  Normal orbit. Other: None IMPRESSION: Left temporal lobe resection and radiation. 2 small areas of enhancement in the left temporal lobe medial to the resection site may represent locally recurrent tumor or treatment effect. Continue imaging and close follow-up recommended. Electronically Signed   By: Franchot Gallo M.D.   On: 10/20/2019 08:24   Nm Pet Image Restage (ps) Whole Body  Result Date: 10/20/2019 CLINICAL DATA:  Subsequent treatment strategy for stage IV melanoma. Evaluate response to therapy. EXAM: NUCLEAR MEDICINE PET WHOLE BODY TECHNIQUE: 7.3 mCi F-18 FDG was injected intravenously. Full-ring PET imaging was performed from the skull base to thigh after the radiotracer. CT data was obtained and used for attenuation correction and anatomic localization. Fasting blood glucose: 84 mg/dl COMPARISON:  CT 08/16/2019 FINDINGS: Mediastinal blood pool activity: SUV max 1.7 HEAD/NECK: No hypermetabolic activity in the scalp. No hypermetabolic cervical lymph nodes. Incidental CT findings: none CHEST: Along the LEFT heart border, relatively low-density mass measures 4.1 x 2.7 cm which compares to 4.4 x 2.9 cm for no significant change in size. This lesion has essentially no metabolic activity centrally and a thin rim mild metabolic activity (SUV max equal 2.3) There is a focus of atelectasis/consolidation in the LEFT lower lobe measuring 1.5 cm (image 114/4). Which has also very low metabolic activity (SUV max equal 2.3 No hypermetabolic mediastinal lymph nodes. Small amount metabolic active residual thymus in the anterior mediastinum. No hypermetabolic axillary nodes. Incidental CT findings: none ABDOMEN/PELVIS:  No abnormal hypermetabolic activity within the liver, pancreas, adrenal glands, or spleen. No hypermetabolic lymph nodes in the abdomen or pelvis. Hypermetabolic activity associated the bowel is favored physiologic Incidental CT findings: Uterus and adnexa normal small amount free fluid in the pelvis SKELETON: No focal hypermetabolic activity to suggest skeletal metastasis. Incidental CT findings: none EXTREMITIES: No abnormal hypermetabolic activity in the lower extremities. Incidental CT findings: none IMPRESSION: 1. No evidence of active melanoma on whole-body scan. 2. Large mass adjacent to the LEFT ventricle has no metabolic activity centrally and a thin rim of minimally metabolic active tissue peripherally. Findings are not typical of active melanoma. Lesion is similar size to most recent CT scan. 3. Focus of consolidation / atelectasis in the LEFT lower lobe with low metabolic activity is favored benign. Electronically Signed   By: Suzy Bouchard M.D.   On: 10/20/2019 13:12    Impression/Plan: 1. 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  The patient is doing very well well overall regarding her disease. Her lung lesion continues to be stable and she will discuss further with Dr. Julien Nordmann tomorrow. Her brain scan is also stable and no enhancement is currently seen in the surgical site. The two sites adjacent to her surgical site were discussed in conference and it was recommended that we follow these as there is likely inflammatory changes as she is receiving immunotherapy. We discussed proceeding with her next scan in 3 months time. She is in agreement with this plan. She will continue systemic immunotherapy with Opdivo and let us know if she has questions or concerns prior to her next MRI.  Given current concerns for patient exposure during the COVID-19 pandemic, this encounter was conducted via telephone.   The patient has given verbal consent for this type of encounter. The time spent during this encounter was 15 minutes and 50% of that time was spent in the coordination of her care. The attendants for this meeting include Shona Simpson, [facility] and Barnett Hatter  During the encounter, Shona Simpson [facility] was located at home working remotely. Shakena Callari  was located at home.    Carola Rhine, PAC

## 2019-10-24 ENCOUNTER — Inpatient Hospital Stay: Payer: BC Managed Care – PPO

## 2019-10-24 ENCOUNTER — Other Ambulatory Visit: Payer: Self-pay

## 2019-10-24 ENCOUNTER — Encounter: Payer: Self-pay | Admitting: Internal Medicine

## 2019-10-24 ENCOUNTER — Inpatient Hospital Stay (HOSPITAL_BASED_OUTPATIENT_CLINIC_OR_DEPARTMENT_OTHER): Payer: BC Managed Care – PPO | Admitting: Internal Medicine

## 2019-10-24 VITALS — BP 122/81 | HR 67 | Temp 98.2°F | Resp 18 | Ht 69.0 in | Wt 148.2 lb

## 2019-10-24 DIAGNOSIS — C3432 Malignant neoplasm of lower lobe, left bronchus or lung: Secondary | ICD-10-CM

## 2019-10-24 DIAGNOSIS — C7802 Secondary malignant neoplasm of left lung: Secondary | ICD-10-CM

## 2019-10-24 DIAGNOSIS — Z5112 Encounter for antineoplastic immunotherapy: Secondary | ICD-10-CM

## 2019-10-24 DIAGNOSIS — Z79899 Other long term (current) drug therapy: Secondary | ICD-10-CM | POA: Diagnosis not present

## 2019-10-24 DIAGNOSIS — C7931 Secondary malignant neoplasm of brain: Secondary | ICD-10-CM | POA: Diagnosis not present

## 2019-10-24 DIAGNOSIS — C439 Malignant melanoma of skin, unspecified: Secondary | ICD-10-CM | POA: Diagnosis not present

## 2019-10-24 LAB — CBC WITH DIFFERENTIAL (CANCER CENTER ONLY)
Abs Immature Granulocytes: 0 10*3/uL (ref 0.00–0.07)
Basophils Absolute: 0 10*3/uL (ref 0.0–0.1)
Basophils Relative: 1 %
Eosinophils Absolute: 0.1 10*3/uL (ref 0.0–0.5)
Eosinophils Relative: 3 %
HCT: 42.6 % (ref 36.0–46.0)
Hemoglobin: 14.3 g/dL (ref 12.0–15.0)
Immature Granulocytes: 0 %
Lymphocytes Relative: 18 %
Lymphs Abs: 0.7 10*3/uL (ref 0.7–4.0)
MCH: 31.2 pg (ref 26.0–34.0)
MCHC: 33.6 g/dL (ref 30.0–36.0)
MCV: 93 fL (ref 80.0–100.0)
Monocytes Absolute: 0.6 10*3/uL (ref 0.1–1.0)
Monocytes Relative: 16 %
Neutro Abs: 2.3 10*3/uL (ref 1.7–7.7)
Neutrophils Relative %: 62 %
Platelet Count: 253 10*3/uL (ref 150–400)
RBC: 4.58 MIL/uL (ref 3.87–5.11)
RDW: 12.8 % (ref 11.5–15.5)
WBC Count: 3.6 10*3/uL — ABNORMAL LOW (ref 4.0–10.5)
nRBC: 0 % (ref 0.0–0.2)

## 2019-10-24 LAB — CMP (CANCER CENTER ONLY)
ALT: 34 U/L (ref 0–44)
AST: 38 U/L (ref 15–41)
Albumin: 4.1 g/dL (ref 3.5–5.0)
Alkaline Phosphatase: 92 U/L (ref 38–126)
Anion gap: 10 (ref 5–15)
BUN: 11 mg/dL (ref 6–20)
CO2: 24 mmol/L (ref 22–32)
Calcium: 9.1 mg/dL (ref 8.9–10.3)
Chloride: 108 mmol/L (ref 98–111)
Creatinine: 0.86 mg/dL (ref 0.44–1.00)
GFR, Est AFR Am: >60 mL/min (ref >60)
GFR, Estimated: >60 mL/min (ref >60)
Glucose, Bld: 81 mg/dL (ref 70–99)
Potassium: 4.2 mmol/L (ref 3.5–5.1)
Sodium: 142 mmol/L (ref 135–145)
Total Bilirubin: 0.4 mg/dL (ref 0.3–1.2)
Total Protein: 6.7 g/dL (ref 6.5–8.1)

## 2019-10-24 MED ORDER — SODIUM CHLORIDE 0.9 % IV SOLN
Freq: Once | INTRAVENOUS | Status: AC
  Administered 2019-10-24: 10:00:00 via INTRAVENOUS
  Filled 2019-10-24: qty 250

## 2019-10-24 MED ORDER — SODIUM CHLORIDE 0.9 % IV SOLN
240.0000 mg | Freq: Once | INTRAVENOUS | Status: AC
  Administered 2019-10-24: 240 mg via INTRAVENOUS
  Filled 2019-10-24: qty 24

## 2019-10-24 NOTE — Progress Notes (Signed)
[place] [phone]   [phone]  OFFICE PROGRESS NOTE  System, Pcp Not In No address on file  DIAGNOSIS: Metastatic high-grade neoplasm consistent with metastatic malignant melanoma based on the most recent pathology report from [facility] in October 2019, presented with large left left upper/left lower lobe mass with a hypermetabolic AP window lymph node as well as solitary metastatic brain lesion diagnosed in May 2019.  Biomarker Findings Microsatellite status - MS-Stable Tumor Mutational Burden - TMB-Intermediate (16 Muts/Mb) Genomic Findings For a complete list of the genes assayed, please refer to the Appendix. CD274 (PD-L1) amplification NRAS Q61R PDCD1LG2 (PD-L2) amplification MYC amplification EPHB1 amplification JAK2 amplification RB1 Q93*, L892* TERT promoter -146C>T TP53 C275W   PRIOR THERAPY: 1) left temporal craniotomy with resection of tumor with intraoperative stereotactic guidance for volumetric resection under the care of Dr. Annette Stable on 04/29/2018. 2) First-line treatment with immunotherapy with Keytruda 200 mg IV every 3 weeks.  First dose June 02, 2018.  Status post 3 cycles.  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 3) palliative radiotherapy to the left lung mass under the care of Dr. Lisbeth Renshaw completed on 08/24/2018. 4) Resuming her treatment again with Keytruda 200 mg IV every 3 weeks, first dose 09/08/2018.  Status post 2 cycles.  CURRENT THERAPY: Treatment with immunotherapy with ipilimumab 3 mg/KG and nivolumab 1 mg/KG every 3 weeks for the first 4 cycles followed by maintenance nivolumab 240 mg IV every 2 weeks.  First dose of this treatment October 26, 2018.  Status post 20 cycles.  Ipilimumab was discontinued after cycle #2 for the significant liver dysfunction.  Starting from cycle #3 the patient is on treatment with single agent nivolumab.  INTERVAL  HISTORY: Leslie [name] 43 y.o. female returns to the clinic today for follow-up visit.  The patient is feeling fine today with no concerning complaints.  She denied having any current chest pain, shortness of breath, cough or hemoptysis.  She denied having any fever or chills.  She has no nausea, vomiting, diarrhea or constipation.  She denied having any weight loss or night sweats.  She has no headache or visual changes.  She continues to tolerate her treatment with nivolumab fairly well.  The patient had repeat PET scan performed recently and she is here for evaluation and discussion of her scan results and treatment options.   MEDICAL HISTORY: Past Medical History:  Diagnosis Date   met melanoma to brain and lung dx'd 04/2018   unknown origin   Migraines    Yeast infection     ALLERGIES:  is allergic to diflucan [fluconazole].  MEDICATIONS:  Current Outpatient Medications  Medication Sig Dispense Refill   AMERICAN GINSENG PO Take 1,500-3,000 mg by mouth daily. Depending on level of fatigue     Amino Acids (L-CARNITINE) LIQD Take 2,000 mg by mouth daily. Liquid L-Carnitine 2000 mg     Ascorbic Acid (LIQUID C 500) 500 MG/15ML LIQD Take 1,000 mg by mouth daily.     Beta Glucan POWD 1 Dose by Does not apply route daily. Beta D Glucan 400 mg     Calcium-Magnesium (CAL-MAG PO) Take 15 mLs by mouth daily. Marine Based Cal - Mag (500 mg Calcium / 250 mg Mag) 15 ml total     Coenzyme Q10 (COQ-10) 100 MG CAPS Take 1 tablet by mouth daily.     CREATINE MONOHYDRATE PO Take 6 g by mouth daily. Creatine MonoHydrate 6 g (on training  cycle)     doxylamine, Sleep, (UNISOM) 25 MG tablet Take 12.5 mg by mouth at bedtime as needed for sleep.     Glucosamine-Chondroit-Vit C-Mn (GLUCOSAMINE 1500 COMPLEX PO) Take 1 tablet by mouth daily. Vegan CMO Glucosamine     Hyaluronic Acid-Vitamin C (HYALURONIC ACID PO) Take 100 mg by mouth daily. Liquid Hyaluronic Acid 245 mg     Iron-Folic  Acid-Vit Y09 (IRON FORMULA PO) Take 1 Dose by mouth daily. Iron 50 mg (1/2 before workout and 1/2 afterwards - cycle 1 wk off and on)     Lysine POWD 1,600 mg by Does not apply route daily. L-Lysine Powder 1600 mg     Methylsulfonylmethane (MSM) 1000 MG CAPS Take 4 capsules by mouth daily. 4000 mg (1/2 before workout and 1/2 afterwards)     Multiple Vitamin (MULTIVITAMIN) tablet Take 1 tablet by mouth daily.     Multiple Vitamins-Minerals (ZINC PO) Take 1 tablet by mouth daily. Raw Zinc 30 mg (cycle every other week)     NON FORMULARY Take 1 Dose by mouth daily. Cordyceps 750 mg     NON FORMULARY Take 1 Dose by mouth daily. Liver Herbal Supplement 675 mg     NON FORMULARY Take 1 Dose by mouth daily. Reishi Mushroom 500 mg     NON FORMULARY Take 1 Dose by mouth daily. Curcumin Phytosome (Bioperrine) 3000 mg     NON FORMULARY Take 1 tablet by mouth daily. Artemisnin 200 mg     NON FORMULARY Take 1 tablet by mouth daily. Wellness Herbal Tablet as needed (Elderberry, Ashwagandha, etc)     NON FORMULARY Take 1 tablet by mouth daily. Zinc L Carnosine Complex 75/59 mg (after workout)     Omega 3-6-9 Fatty Acids (OMEGA-3-6-9 PO) Take 2 capsules by mouth daily. Plant Based Omega 3-6-9 2 capsules = 1825 mg     OVER THE COUNTER MEDICATION TK 5 MILLILITERS PO QID PRF MOUTH PAIN     QUERCETIN PO Take 1 tablet by mouth daily. Quercetin 400 mg / Bromelain 85 mg     RESVERATROL PO Take 1,200 mg by mouth daily. Trans Resveratrol     VITAMIN A PO Take 1 tablet by mouth daily. 1500 mcg (5000 IU)     Vitamin D-Vitamin K (VITAMIN K2-VITAMIN D3 PO) Take 1 capsule by mouth daily. 5000 IU + 50 mcg     VITAMIN E PO Take 1 tablet by mouth daily. Vegan Vitamin E     No current facility-administered medications for this visit.     SURGICAL HISTORY:  Past Surgical History:  Procedure Laterality Date   APPLICATION OF CRANIAL NAVIGATION Left 04/29/2018   Procedure: APPLICATION OF CRANIAL NAVIGATION;   Surgeon: Earnie Larsson, MD;  Location: [facility];  Service: Neurosurgery;  Laterality: Left;   CRANIOTOMY Left 04/29/2018   Procedure: LEFT CRANIOTOMY FOR  TUMOR BRAIN LAB;  Surgeon: Earnie Larsson, MD;  Location: [facility];  Service: Neurosurgery;  Laterality: Left;    REVIEW OF SYSTEMS:  Constitutional: negative Eyes: negative Ears, nose, mouth, throat, and face: negative Respiratory: negative Cardiovascular: negative Gastrointestinal: negative Genitourinary:negative Integument/breast: negative Hematologic/lymphatic: negative Musculoskeletal:negative Neurological: negative Behavioral/Psych: negative Endocrine: negative Allergic/Immunologic: negative   PHYSICAL EXAMINATION: General appearance: alert, cooperative and no distress Head: Normocephalic, without obvious abnormality, atraumatic Neck: no adenopathy, no JVD, supple, symmetrical, trachea midline and thyroid not enlarged, symmetric, no tenderness/mass/nodules Lymph nodes: Cervical, supraclavicular, and axillary nodes normal. Resp: clear to auscultation bilaterally Back: negative, symmetric, no curvature. ROM normal. No CVA  tenderness. Cardio: regular rate and rhythm, S1, S2 normal, no murmur, click, rub or gallop GI: soft, non-tender; bowel sounds normal; no masses,  no organomegaly Extremities: extremities normal, atraumatic, no cyanosis or edema Neurologic: Alert and oriented X 3, normal strength and tone. Normal symmetric reflexes. Normal coordination and gait  ECOG PERFORMANCE STATUS: 0 - Asymptomatic  Blood pressure 122/81, pulse 67, temperature 98.2 F (36.8 C), temperature source Temporal, resp. rate 18, height 5' 9"  (1.753 m), weight 148 lb 3.2 oz (67.2 kg), SpO2 100 %, unknown if currently breastfeeding.  LABORATORY DATA: Lab Results  Component Value Date   WBC 3.6 (L) 10/24/2019   HGB 14.3 10/24/2019   HCT 42.6 10/24/2019   MCV 93.0 10/24/2019   PLT 253 10/24/2019      Chemistry      Component Value Date/Time   NA  140 10/10/2019 1011   K 4.1 10/10/2019 1011   CL 107 10/10/2019 1011   CO2 25 10/10/2019 1011   BUN 11 10/10/2019 1011   CREATININE 0.99 10/10/2019 1011      Component Value Date/Time   CALCIUM 9.4 10/10/2019 1011   ALKPHOS 91 10/10/2019 1011   AST 42 (H) 10/10/2019 1011   ALT 33 10/10/2019 1011   BILITOT 0.4 10/10/2019 1011       RADIOGRAPHIC STUDIES: Mr Jeri Cos OZ Contrast  Result Date: 10/20/2019 CLINICAL DATA:  History of metastatic disease to brain. Left temporal resection and radiation. History of melanoma. EXAM: MRI HEAD WITHOUT AND WITH CONTRAST TECHNIQUE: Multiplanar, multiecho pulse sequences of the brain and surrounding structures were obtained without and with intravenous contrast. CONTRAST:  64m MULTIHANCE GADOBENATE DIMEGLUMINE 529 MG/ML IV SOLN COMPARISON:  MRI head 06/22/2019, 03/17/2019 FINDINGS: Brain: Left temporal lobe resection. There is encephalomalacia in the left anterior temporal lobe with adjacent white matter hyperintensity on T2 and FLAIR which appears stable. Progression of 2 small enhancing nodules in the left inferior and medial temporal lobe. One of these in retrospect was present previously and is larger now measuring 1.5 mm. The second is new measuring approximately 2 x 5 mm. No new areas of remote metastatic disease identified. Ventricle size normal. Few small white matter hyperintensities stable. Vascular: Normal arterial flow voids Skull and upper cervical spine: No focal bony lesion. Sinuses/Orbits: Mild mucosal edema paranasal sinuses.  Normal orbit. Other: None IMPRESSION: Left temporal lobe resection and radiation. 2 small areas of enhancement in the left temporal lobe medial to the resection site may represent locally recurrent tumor or treatment effect. Continue imaging and close follow-up recommended. Electronically Signed   By: CFranchot GalloM.D.   On: 10/20/2019 08:24   Nm Pet Image Restage (ps) Whole Body  Result Date: 10/20/2019 CLINICAL DATA:   Subsequent treatment strategy for stage IV melanoma. Evaluate response to therapy. EXAM: NUCLEAR MEDICINE PET WHOLE BODY TECHNIQUE: 7.3 mCi F-18 FDG was injected intravenously. Full-ring PET imaging was performed from the skull base to thigh after the radiotracer. CT data was obtained and used for attenuation correction and anatomic localization. Fasting blood glucose: 84 mg/dl COMPARISON:  CT 08/16/2019 FINDINGS: Mediastinal blood pool activity: SUV max 1.7 HEAD/NECK: No hypermetabolic activity in the scalp. No hypermetabolic cervical lymph nodes. Incidental CT findings: none CHEST: Along the LEFT heart border, relatively low-density mass measures 4.1 x 2.7 cm which compares to 4.4 x 2.9 cm for no significant change in size. This lesion has essentially no metabolic activity centrally and a thin rim mild metabolic activity (SUV max equal 2.3) There  is a focus of atelectasis/consolidation in the LEFT lower lobe measuring 1.5 cm (image 114/4). Which has also very low metabolic activity (SUV max equal 2.3 No hypermetabolic mediastinal lymph nodes. Small amount metabolic active residual thymus in the anterior mediastinum. No hypermetabolic axillary nodes. Incidental CT findings: none ABDOMEN/PELVIS: No abnormal hypermetabolic activity within the liver, pancreas, adrenal glands, or spleen. No hypermetabolic lymph nodes in the abdomen or pelvis. Hypermetabolic activity associated the bowel is favored physiologic Incidental CT findings: Uterus and adnexa normal small amount free fluid in the pelvis SKELETON: No focal hypermetabolic activity to suggest skeletal metastasis. Incidental CT findings: none EXTREMITIES: No abnormal hypermetabolic activity in the lower extremities. Incidental CT findings: none IMPRESSION: 1. No evidence of active melanoma on whole-body scan. 2. Large mass adjacent to the LEFT ventricle has no metabolic activity centrally and a thin rim of minimally metabolic active tissue peripherally. Findings  are not typical of active melanoma. Lesion is similar size to most recent CT scan. 3. Focus of consolidation / atelectasis in the LEFT lower lobe with low metabolic activity is favored benign. Electronically Signed   By: Suzy Bouchard M.D.   On: 10/20/2019 13:12    ASSESSMENT AND PLAN: This is a very pleasant 43 years old white female with highly suspicious metastatic malignant melanoma presented with large mass in the left upper/left lower lobe and mediastinal lymphadenopathy as well as solitary brain metastasis status post left temporal craniotomy and resection of tumor on 04/29/2018 and she is recovering well from her surgery. The patient completed stereotactic radiotherapy to the resection cavity next week under the care of Dr. Lisbeth Renshaw. The patient underwent treatment with immunotherapy with Keytruda 200 mg IV every 3 weeks status post 3 cycles.   Her scan after cycle #3 showed enlargement of the left upper lobe lung mass.  This was also suspicious for pseudo-progression on immunotherapy.  The patient was started on a palliative course of radiotherapy to the left upper lobe lung mass and she tolerated this treatment fairly well.  She was seen by Dr. Emelda Brothers at [facility] and she recommended for the patient to resume her treatment with [facility] for now until she undergoes further molecular studies.  The patient was treated with 2 more cycles of Keytruda and tolerated the treatment well. Her recent CT scan of the chest, abdomen and pelvis showed some improvement in the left upper lobe lung mass but there is still concern about pericardial invasion. I personally and independently reviewed the scan images and discussed the result and showed the images to the patient and her boyfriend today.  She is still not a good candidate for surgical resection because of the pericardial invasion. The final pathology report and recommendation from [facility] was consistent with metastatic  melanoma and the recommendation is to switch the patient to a combination immunotherapy with Ipilumumab and nivolumab. The patient is currently undergoing treatment with immunotherapy with ipilimumab and nivolumab status post 2 cycles.  Her treatment was on hold for more than 6 weeks secondary to grade 3 hepatic dysfunction.  She had improvement of her liver enzyme after a prolonged treatment with a steroid with a tapering schedule. She resumed her treatment again with single agent nivolumab.  She is status post 20 cycles of treatment. The patient has been tolerating this treatment well with no concerning adverse effects. She had a PET scan performed recently.  I personally and independently reviewed the PET scan images and discussed the result and showed the images to the  patient today. Her PET scan showed no abnormal hypermetabolic activity in the whole body or in the large left lung mass adjacent to the heart.  This indicative of significant improvement of her disease. I recommended for the patient to continue her current treatment with nivolumab and she will proceed with cycle #21 today. The patient will come back for follow-up visit in 2 weeks for evaluation before the next cycle of her treatment. She was advised to call immediately if she has any concerning symptoms in the interval. The patient voices understanding of current disease status and treatment options and is in agreement with the current care plan. All questions were answered. The patient knows to call the clinic with any problems, questions or concerns. We can certainly see the patient much sooner if necessary.  Disclaimer: This note was dictated with voice recognition software. Similar sounding words can inadvertently be transcribed and may not be corrected upon review.

## 2019-10-24 NOTE — Patient Instructions (Signed)
[place] Discharge Instructions for Patients Receiving Chemotherapy  Today you received the following chemotherapy agents: Nivolumab  To help prevent nausea and vomiting after your treatment, we encourage you to take your nausea medication as directed.    If you develop nausea and vomiting that is not controlled by your nausea medication, call the clinic.   BELOW ARE SYMPTOMS THAT SHOULD BE REPORTED IMMEDIATELY:  *FEVER GREATER THAN 100.5 F  *CHILLS WITH OR WITHOUT FEVER  NAUSEA AND VOMITING THAT IS NOT CONTROLLED WITH YOUR NAUSEA MEDICATION  *UNUSUAL SHORTNESS OF BREATH  *UNUSUAL BRUISING OR BLEEDING  TENDERNESS IN MOUTH AND THROAT WITH OR WITHOUT PRESENCE OF ULCERS  *URINARY PROBLEMS  *BOWEL PROBLEMS  UNUSUAL RASH Items with * indicate a potential emergency and should be followed up as soon as possible.  Feel free to call the clinic should you have any questions or concerns. The clinic phone number is [phone].  Please show the [place] at check-in to the Emergency Department and triage nurse.

## 2019-11-01 DIAGNOSIS — C7931 Secondary malignant neoplasm of brain: Secondary | ICD-10-CM | POA: Diagnosis not present

## 2019-11-01 DIAGNOSIS — Z7189 Other specified counseling: Secondary | ICD-10-CM | POA: Diagnosis not present

## 2019-11-01 DIAGNOSIS — G479 Sleep disorder, unspecified: Secondary | ICD-10-CM | POA: Diagnosis not present

## 2019-11-03 DIAGNOSIS — G4700 Insomnia, unspecified: Secondary | ICD-10-CM | POA: Diagnosis not present

## 2019-11-03 DIAGNOSIS — C7931 Secondary malignant neoplasm of brain: Secondary | ICD-10-CM | POA: Diagnosis not present

## 2019-11-03 DIAGNOSIS — J9811 Atelectasis: Secondary | ICD-10-CM | POA: Diagnosis not present

## 2019-11-03 DIAGNOSIS — Z923 Personal history of irradiation: Secondary | ICD-10-CM | POA: Diagnosis not present

## 2019-11-03 DIAGNOSIS — Z9289 Personal history of other medical treatment: Secondary | ICD-10-CM | POA: Diagnosis not present

## 2019-11-03 DIAGNOSIS — C799 Secondary malignant neoplasm of unspecified site: Secondary | ICD-10-CM | POA: Diagnosis not present

## 2019-11-03 DIAGNOSIS — C439 Malignant melanoma of skin, unspecified: Secondary | ICD-10-CM | POA: Diagnosis not present

## 2019-11-06 ENCOUNTER — Other Ambulatory Visit: Payer: Self-pay | Admitting: Internal Medicine

## 2019-11-06 ENCOUNTER — Telehealth: Payer: Self-pay | Admitting: Internal Medicine

## 2019-11-06 ENCOUNTER — Telehealth: Payer: Self-pay | Admitting: *Deleted

## 2019-11-06 DIAGNOSIS — C3432 Malignant neoplasm of lower lobe, left bronchus or lung: Secondary | ICD-10-CM

## 2019-11-06 DIAGNOSIS — C7802 Secondary malignant neoplasm of left lung: Secondary | ICD-10-CM

## 2019-11-06 NOTE — Telephone Encounter (Signed)
Leslie [name] left a message stating the Dr Thyra Breed at [facility] has granted her a 2 month hiatus from chemo infusions. She wants to make sure Dr Julien Nordmann had gotten that message. Wants to know if he will want to see her in 8 weeks with a CT.

## 2019-11-06 NOTE — Telephone Encounter (Signed)
Returned patient's phone call regarding cancelling November and December appointments, cancelled per request and sent provider a staff message.

## 2019-11-06 NOTE — Telephone Encounter (Signed)
OK to hold her treatment and repeat scan in 2 months. Thank you.

## 2019-11-07 ENCOUNTER — Inpatient Hospital Stay: Payer: BC Managed Care – PPO

## 2019-11-07 ENCOUNTER — Inpatient Hospital Stay: Payer: BC Managed Care – PPO | Admitting: Internal Medicine

## 2019-11-07 ENCOUNTER — Telehealth: Payer: Self-pay | Admitting: Internal Medicine

## 2019-11-07 NOTE — Telephone Encounter (Signed)
Scheduled appt per 11/23 sch message - unable to reach pt . Left message with appt date and time

## 2019-11-13 DIAGNOSIS — M9901 Segmental and somatic dysfunction of cervical region: Secondary | ICD-10-CM | POA: Diagnosis not present

## 2019-11-13 DIAGNOSIS — M2560 Stiffness of unspecified joint, not elsewhere classified: Secondary | ICD-10-CM | POA: Diagnosis not present

## 2019-11-13 DIAGNOSIS — M9907 Segmental and somatic dysfunction of upper extremity: Secondary | ICD-10-CM | POA: Diagnosis not present

## 2019-11-13 DIAGNOSIS — M62838 Other muscle spasm: Secondary | ICD-10-CM | POA: Diagnosis not present

## 2019-11-21 ENCOUNTER — Ambulatory Visit: Payer: BC Managed Care – PPO

## 2019-11-21 ENCOUNTER — Other Ambulatory Visit: Payer: BC Managed Care – PPO

## 2019-11-21 ENCOUNTER — Ambulatory Visit: Payer: BC Managed Care – PPO | Admitting: Physician Assistant

## 2019-12-11 DIAGNOSIS — F419 Anxiety disorder, unspecified: Secondary | ICD-10-CM | POA: Diagnosis not present

## 2019-12-12 ENCOUNTER — Other Ambulatory Visit: Payer: Self-pay | Admitting: *Deleted

## 2019-12-12 DIAGNOSIS — C7931 Secondary malignant neoplasm of brain: Secondary | ICD-10-CM

## 2019-12-25 DIAGNOSIS — M9907 Segmental and somatic dysfunction of upper extremity: Secondary | ICD-10-CM | POA: Diagnosis not present

## 2019-12-25 DIAGNOSIS — M9901 Segmental and somatic dysfunction of cervical region: Secondary | ICD-10-CM | POA: Diagnosis not present

## 2019-12-25 DIAGNOSIS — M2560 Stiffness of unspecified joint, not elsewhere classified: Secondary | ICD-10-CM | POA: Diagnosis not present

## 2019-12-25 DIAGNOSIS — M62838 Other muscle spasm: Secondary | ICD-10-CM | POA: Diagnosis not present

## 2019-12-27 ENCOUNTER — Other Ambulatory Visit: Payer: Self-pay | Admitting: Radiation Therapy

## 2019-12-28 DIAGNOSIS — F419 Anxiety disorder, unspecified: Secondary | ICD-10-CM | POA: Diagnosis not present

## 2020-01-05 ENCOUNTER — Other Ambulatory Visit: Payer: Self-pay

## 2020-01-05 ENCOUNTER — Inpatient Hospital Stay: Payer: BC Managed Care – PPO | Attending: Physician Assistant

## 2020-01-05 ENCOUNTER — Ambulatory Visit (HOSPITAL_COMMUNITY)
Admission: RE | Admit: 2020-01-05 | Discharge: 2020-01-05 | Disposition: A | Discharge status: Home / Self Care | Payer: BC Managed Care – PPO | Source: Ambulatory Visit | Attending: Internal Medicine | Admitting: Internal Medicine

## 2020-01-05 DIAGNOSIS — C7802 Secondary malignant neoplasm of left lung: Secondary | ICD-10-CM | POA: Insufficient documentation

## 2020-01-05 DIAGNOSIS — Z79899 Other long term (current) drug therapy: Secondary | ICD-10-CM | POA: Insufficient documentation

## 2020-01-05 DIAGNOSIS — Z9221 Personal history of antineoplastic chemotherapy: Secondary | ICD-10-CM | POA: Diagnosis not present

## 2020-01-05 DIAGNOSIS — C3432 Malignant neoplasm of lower lobe, left bronchus or lung: Secondary | ICD-10-CM

## 2020-01-05 DIAGNOSIS — C439 Malignant melanoma of skin, unspecified: Secondary | ICD-10-CM | POA: Insufficient documentation

## 2020-01-05 DIAGNOSIS — C7931 Secondary malignant neoplasm of brain: Secondary | ICD-10-CM | POA: Diagnosis not present

## 2020-01-05 LAB — CMP (CANCER CENTER ONLY)
ALT: 31 U/L (ref 0–44)
AST: 35 U/L (ref 15–41)
Albumin: 4.3 g/dL (ref 3.5–5.0)
Alkaline Phosphatase: 94 U/L (ref 38–126)
Anion gap: 9 (ref 5–15)
BUN: 10 mg/dL (ref 6–20)
CO2: 25 mmol/L (ref 22–32)
Calcium: 9.1 mg/dL (ref 8.9–10.3)
Chloride: 107 mmol/L (ref 98–111)
Creatinine: 0.91 mg/dL (ref 0.44–1.00)
GFR, Est AFR Am: >60 mL/min (ref >60)
GFR, Estimated: >60 mL/min (ref >60)
Glucose, Bld: 76 mg/dL (ref 70–99)
Potassium: 4.2 mmol/L (ref 3.5–5.1)
Sodium: 141 mmol/L (ref 135–145)
Total Bilirubin: 0.3 mg/dL (ref 0.3–1.2)
Total Protein: 6.9 g/dL (ref 6.5–8.1)

## 2020-01-05 LAB — CBC WITH DIFFERENTIAL (CANCER CENTER ONLY)
Abs Immature Granulocytes: 0.01 K/uL (ref 0.00–0.07)
Basophils Absolute: 0 K/uL (ref 0.0–0.1)
Basophils Relative: 1 %
Eosinophils Absolute: 0.1 K/uL (ref 0.0–0.5)
Eosinophils Relative: 2 %
HCT: 41.2 % (ref 36.0–46.0)
Hemoglobin: 13.7 g/dL (ref 12.0–15.0)
Immature Granulocytes: 0 %
Lymphocytes Relative: 24 %
Lymphs Abs: 0.8 K/uL (ref 0.7–4.0)
MCH: 30.6 pg (ref 26.0–34.0)
MCHC: 33.3 g/dL (ref 30.0–36.0)
MCV: 92 fL (ref 80.0–100.0)
Monocytes Absolute: 0.4 K/uL (ref 0.1–1.0)
Monocytes Relative: 13 %
Neutro Abs: 1.9 K/uL (ref 1.7–7.7)
Neutrophils Relative %: 60 %
Platelet Count: 260 K/uL (ref 150–400)
RBC: 4.48 MIL/uL (ref 3.87–5.11)
RDW: 12.8 % (ref 11.5–15.5)
WBC Count: 3.2 K/uL — ABNORMAL LOW (ref 4.0–10.5)
nRBC: 0 % (ref 0.0–0.2)

## 2020-01-05 LAB — TSH: TSH: 2.015 u[IU]/mL (ref 0.308–3.960)

## 2020-01-05 MED ORDER — SODIUM CHLORIDE (PF) 0.9 % IJ SOLN
INTRAMUSCULAR | Status: AC
  Filled 2020-01-05: qty 50

## 2020-01-05 MED ORDER — IOHEXOL 300 MG/ML  SOLN
100.0000 mL | Freq: Once | INTRAMUSCULAR | Status: AC | PRN
  Administered 2020-01-05: 100 mL via INTRAVENOUS

## 2020-01-08 ENCOUNTER — Other Ambulatory Visit: Payer: Self-pay

## 2020-01-08 ENCOUNTER — Inpatient Hospital Stay (HOSPITAL_BASED_OUTPATIENT_CLINIC_OR_DEPARTMENT_OTHER): Payer: BC Managed Care – PPO | Admitting: Internal Medicine

## 2020-01-08 ENCOUNTER — Encounter: Payer: Self-pay | Admitting: Internal Medicine

## 2020-01-08 VITALS — BP 131/88 | HR 67 | Temp 97.8°F | Resp 16 | Ht 69.0 in | Wt 148.2 lb

## 2020-01-08 DIAGNOSIS — Z79899 Other long term (current) drug therapy: Secondary | ICD-10-CM | POA: Diagnosis not present

## 2020-01-08 DIAGNOSIS — C7802 Secondary malignant neoplasm of left lung: Secondary | ICD-10-CM | POA: Diagnosis not present

## 2020-01-08 DIAGNOSIS — C439 Malignant melanoma of skin, unspecified: Secondary | ICD-10-CM

## 2020-01-08 DIAGNOSIS — Z9221 Personal history of antineoplastic chemotherapy: Secondary | ICD-10-CM | POA: Diagnosis not present

## 2020-01-08 DIAGNOSIS — C3432 Malignant neoplasm of lower lobe, left bronchus or lung: Secondary | ICD-10-CM | POA: Diagnosis not present

## 2020-01-08 DIAGNOSIS — C7931 Secondary malignant neoplasm of brain: Secondary | ICD-10-CM | POA: Diagnosis not present

## 2020-01-08 NOTE — Progress Notes (Signed)
[place] [phone]   [phone]  OFFICE PROGRESS NOTE  System, Pcp Not In No address on file  DIAGNOSIS: Metastatic high-grade neoplasm consistent with metastatic malignant melanoma based on the most recent pathology report from [facility] in October 2019, presented with large left left upper/left lower lobe mass with a hypermetabolic AP window lymph node as well as solitary metastatic brain lesion diagnosed in May 2019.  Biomarker Findings Microsatellite status - MS-Stable Tumor Mutational Burden - TMB-Intermediate (16 Muts/Mb) Genomic Findings For a complete list of the genes assayed, please refer to the Appendix. CD274 (PD-L1) amplification NRAS Q61R PDCD1LG2 (PD-L2) amplification MYC amplification EPHB1 amplification JAK2 amplification RB1 Q93*, G992* TERT promoter -146C>T TP53 C275W   PRIOR THERAPY: 1) left temporal craniotomy with resection of tumor with intraoperative stereotactic guidance for volumetric resection under the care of Dr. Annette Stable on 04/29/2018. 2) First-line treatment with immunotherapy with Keytruda 200 mg IV every 3 weeks.  First dose June 02, 2018.  Status post 3 cycles.  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 3) palliative radiotherapy to the left lung mass under the care of Dr. Lisbeth Renshaw completed on 08/24/2018. 4) Resuming her treatment again with Keytruda 200 mg IV every 3 weeks, first dose 09/08/2018.  Status post 2 cycles.  CURRENT THERAPY: Treatment with immunotherapy with ipilimumab 3 mg/KG and nivolumab 1 mg/KG every 3 weeks for the first 4 cycles followed by maintenance nivolumab 240 mg IV every 2 weeks.  First dose of this treatment October 26, 2018.  Status post 21 cycles.  Ipilimumab was discontinued after cycle #2 for the significant liver dysfunction.  Starting from cycle #3 the patient is on treatment with single agent nivolumab.  INTERVAL  HISTORY: Leslie [name] 44 y.o. female returns to the clinic today for follow-up visit.  The patient is feeling fine today with no concerning complaints.  She denied having any current chest pain, shortness of breath, cough or hemoptysis.  She denied having any fever or chills.  She has no nausea, vomiting, diarrhea or constipation.  She denied having any headache or visual changes.  She has been off treatment for the last 2 months.  She had repeat CT scan of the chest, abdomen pelvis performed recently and she is here for evaluation and discussion of her risk her results.   MEDICAL HISTORY: Past Medical History:  Diagnosis Date  . 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 Migraines   . Yeast infection     ALLERGIES:  is allergic to diflucan [fluconazole].  MEDICATIONS:  Current Outpatient Medications  Medication Sig Dispense Refill  . AMERICAN GINSENG PO Take 1,500-3,000 mg by mouth daily. Depending on level of fatigue    . Amino Acids (L-CARNITINE) LIQD Take 2,000 mg by mouth daily. Liquid L-Carnitine 2000 mg    . Ascorbic Acid (LIQUID C 500) 500 MG/15ML LIQD Take 1,000 mg by mouth daily.    . Beta Glucan POWD 1 Dose by Does not apply route daily. Beta D Glucan 400 mg    . Calcium-Magnesium (CAL-MAG PO) Take 15 mLs by mouth daily. Marine Based Cal - Mag (500 mg Calcium / 250 mg Mag) 15 ml total    . Coenzyme Q10 (COQ-10) 100 MG CAPS Take 1 tablet by mouth daily.    Marland Kitchen CREATINE MONOHYDRATE PO Take 6 g by mouth daily. Creatine MonoHydrate 6 g (on training cycle)    . doxylamine, Sleep, (  UNISOM) 25 MG tablet Take 12.5 mg by mouth at bedtime as needed for sleep.    . Glucosamine-Chondroit-Vit C-Mn (GLUCOSAMINE 1500 COMPLEX PO) Take 1 tablet by mouth daily. Vegan CMO Glucosamine    . Hyaluronic Acid-Vitamin C (HYALURONIC ACID PO) Take 100 mg by mouth daily. Liquid Hyaluronic Acid 100 mg    . Iron-Folic Acid-Vit Z61 (IRON FORMULA PO) Take 1 Dose by mouth daily. Iron  50 mg (1/2 before workout and 1/2 afterwards - cycle 1 wk off and on)    . Lysine POWD 1,600 mg by Does not apply route daily. L-Lysine Powder 1600 mg    . Methylsulfonylmethane (MSM) 1000 MG CAPS Take 4 capsules by mouth daily. 4000 mg (1/2 before workout and 1/2 afterwards)    . Multiple Vitamin (MULTIVITAMIN) tablet Take 1 tablet by mouth daily.    . Multiple Vitamins-Minerals (ZINC PO) Take 1 tablet by mouth daily. Raw Zinc 30 mg (cycle every other week)    . NON FORMULARY Take 1 Dose by mouth daily. Cordyceps 750 mg    . NON FORMULARY Take 1 Dose by mouth daily. Liver Herbal Supplement 675 mg    . NON FORMULARY Take 1 Dose by mouth daily. Reishi Mushroom 500 mg    . NON FORMULARY Take 1 Dose by mouth daily. Curcumin Phytosome (Bioperrine) 3000 mg    . NON FORMULARY Take 1 tablet by mouth daily. Artemisnin 200 mg    . NON FORMULARY Take 1 tablet by mouth daily. Wellness Herbal Tablet as needed (Elderberry, Ashwagandha, etc)    . NON FORMULARY Take 1 tablet by mouth daily. Zinc L Carnosine Complex 75/59 mg (after workout)    . Omega 3-6-9 Fatty Acids (OMEGA-3-6-9 PO) Take 2 capsules by mouth daily. Plant Based Omega 3-6-9 2 capsules = 1825 mg    . OVER THE COUNTER MEDICATION TK 5 MILLILITERS PO QID PRF MOUTH PAIN    . QUERCETIN PO Take 1 tablet by mouth daily. Quercetin 400 mg / Bromelain 85 mg    . RESVERATROL PO Take 1,200 mg by mouth daily. Trans Resveratrol    . VITAMIN A PO Take 1 tablet by mouth daily. 1500 mcg (5000 IU)    . Vitamin D-Vitamin K (VITAMIN K2-VITAMIN D3 PO) Take 1 capsule by mouth daily. 5000 IU + 50 mcg    . VITAMIN E PO Take 1 tablet by mouth daily. Vegan Vitamin E     No current facility-administered medications for this visit.    SURGICAL HISTORY:  Past Surgical History:  Procedure Laterality Date  . APPLICATION OF CRANIAL NAVIGATION Left 04/29/2018   Procedure: APPLICATION OF CRANIAL NAVIGATION;  Surgeon: Earnie Larsson, MD;  Location: [place];  Service:  Neurosurgery;  Laterality: Left;  . CRANIOTOMY Left 04/29/2018   Procedure: LEFT CRANIOTOMY FOR  TUMOR BRAIN LAB;  Surgeon: Earnie Larsson, MD;  Location: [place];  Service: Neurosurgery;  Laterality: Left;    REVIEW OF SYSTEMS:  Constitutional: negative Eyes: negative Ears, nose, mouth, throat, and face: negative Respiratory: negative Cardiovascular: negative Gastrointestinal: negative Genitourinary:negative Integument/breast: negative Hematologic/lymphatic: negative Musculoskeletal:negative Neurological: negative Behavioral/Psych: negative Endocrine: negative Allergic/Immunologic: negative   PHYSICAL EXAMINATION: General appearance: alert, cooperative and no distress Head: Normocephalic, without obvious abnormality, atraumatic Neck: no adenopathy, no JVD, supple, symmetrical, trachea midline and thyroid not enlarged, symmetric, no tenderness/mass/nodules Lymph nodes: Cervical, supraclavicular, and axillary nodes normal. Resp: clear to auscultation bilaterally Back: negative, symmetric, no curvature. ROM normal. No CVA tenderness. Cardio: regular rate and rhythm, S1, S2  normal, no murmur, click, rub or gallop GI: soft, non-tender; bowel sounds normal; no masses,  no organomegaly Extremities: extremities normal, atraumatic, no cyanosis or edema Neurologic: Alert and oriented X 3, normal strength and tone. Normal symmetric reflexes. Normal coordination and gait  ECOG PERFORMANCE STATUS: 0 - Asymptomatic  Blood pressure 131/88, pulse 67, temperature 97.8 F (36.6 C), temperature source Temporal, resp. rate 16, height 5' 9"  (1.753 m), weight 148 lb 3.2 oz (67.2 kg), SpO2 100 %, unknown if currently breastfeeding.  LABORATORY DATA: Lab Results  Component Value Date   WBC 3.2 (L) 01/05/2020   HGB 13.7 01/05/2020   HCT 41.2 01/05/2020   MCV 92.0 01/05/2020   PLT 260 01/05/2020      Chemistry      Component Value Date/Time   NA 141 01/05/2020 1018   K 4.2 01/05/2020 1018   CL  107 01/05/2020 1018   CO2 25 01/05/2020 1018   BUN 10 01/05/2020 1018   CREATININE 0.91 01/05/2020 1018      Component Value Date/Time   CALCIUM 9.1 01/05/2020 1018   ALKPHOS 94 01/05/2020 1018   AST 35 01/05/2020 1018   ALT 31 01/05/2020 1018   BILITOT 0.3 01/05/2020 1018       RADIOGRAPHIC STUDIES: CT Chest W Contrast  Result Date: 01/05/2020 CLINICAL DATA:  Metastatic melanoma, restaging EXAM: CT CHEST, ABDOMEN, AND PELVIS WITH CONTRAST TECHNIQUE: Multidetector CT imaging of the chest, abdomen and pelvis was performed following the standard protocol during bolus administration of intravenous contrast. CONTRAST:  160m OMNIPAQUE IOHEXOL 300 MG/ML SOLN, additional oral enteric contrast COMPARISON:  PET-CT, 10/20/2019, CT chest abdomen pelvis, 08/23/2019, 06/09/2019, 04/20/2019 FINDINGS: CT CHEST FINDINGS Cardiovascular: No significant vascular findings. Normal heart size. No pericardial effusion. Mediastinum/Nodes: No enlarged mediastinal, hilar, or axillary lymph nodes. Thymic remnant or rebound in the anterior mediastinum. Unchanged subcentimeter incidental nodule of the right lobe of the thyroid (series 4, image 9). Trachea, and esophagus demonstrate no significant findings. Lungs/Pleura: Unchanged lingular and/or pericardial mass with internal near fluid attenuation and faint peripheral nodularity measuring 4.7 x 2.5 cm (series 2, image 45). This lesion was not internally FDG avid on prior PET-CT. Unchanged adjacent post radiation change of the left lower lobe and lingula. Stable, likely benign 3 mm pulmonary nodule of the left pulmonary apex (series 4, image 40). Musculoskeletal: No chest wall mass or suspicious bone lesions identified. CT ABDOMEN PELVIS FINDINGS Hepatobiliary: No solid liver abnormality is seen. No gallstones, gallbladder wall thickening, or biliary dilatation. Pancreas: Unremarkable. No pancreatic ductal dilatation or surrounding inflammatory changes. Spleen: Normal in size  without significant abnormality. Adrenals/Urinary Tract: Adrenal glands are unremarkable. Kidneys are normal, without renal calculi, solid lesion, or hydronephrosis. Bladder is unremarkable. Stomach/Bowel: Stomach is within normal limits. Appendix appears normal. No evidence of bowel wall thickening, distention, or inflammatory changes. Large burden of stool in the colon. Vascular/Lymphatic: No significant vascular findings are present. No enlarged abdominal or pelvic lymph nodes. Reproductive: No mass or other abnormality. Other: No abdominal wall hernia or abnormality. No abdominopelvic ascites. Musculoskeletal: No acute or significant osseous findings. IMPRESSION: 1. Unchanged lingular and/or pericardial mass with internal near fluid attenuation and faint peripheral nodularity measuring 4.7 x 2.5 cm (series 2, image 45). This lesion was not internally FDG avid on prior PET-CT. 2. Unchanged adjacent post radiation change of the left lower lobe and lingula. 3. Stable, likely benign 3 mm pulmonary nodule of the left pulmonary apex (series 4, image 40). Attention on follow-up. 4. No  evidence of new metastatic disease or metastatic disease in the abdomen or pelvis. Electronically Signed   By: Eddie Candle M.D.   On: 01/05/2020 12:25   CT Abdomen Pelvis W Contrast  Result Date: 01/05/2020 CLINICAL DATA:  Metastatic melanoma, restaging EXAM: CT CHEST, ABDOMEN, AND PELVIS WITH CONTRAST TECHNIQUE: Multidetector CT imaging of the chest, abdomen and pelvis was performed following the standard protocol during bolus administration of intravenous contrast. CONTRAST:  144m OMNIPAQUE IOHEXOL 300 MG/ML SOLN, additional oral enteric contrast COMPARISON:  PET-CT, 10/20/2019, CT chest abdomen pelvis, 08/23/2019, 06/09/2019, 04/20/2019 FINDINGS: CT CHEST FINDINGS Cardiovascular: No significant vascular findings. Normal heart size. No pericardial effusion. Mediastinum/Nodes: No enlarged mediastinal, hilar, or axillary lymph  nodes. Thymic remnant or rebound in the anterior mediastinum. Unchanged subcentimeter incidental nodule of the right lobe of the thyroid (series 4, image 9). Trachea, and esophagus demonstrate no significant findings. Lungs/Pleura: Unchanged lingular and/or pericardial mass with internal near fluid attenuation and faint peripheral nodularity measuring 4.7 x 2.5 cm (series 2, image 45). This lesion was not internally FDG avid on prior PET-CT. Unchanged adjacent post radiation change of the left lower lobe and lingula. Stable, likely benign 3 mm pulmonary nodule of the left pulmonary apex (series 4, image 40). Musculoskeletal: No chest wall mass or suspicious bone lesions identified. CT ABDOMEN PELVIS FINDINGS Hepatobiliary: No solid liver abnormality is seen. No gallstones, gallbladder wall thickening, or biliary dilatation. Pancreas: Unremarkable. No pancreatic ductal dilatation or surrounding inflammatory changes. Spleen: Normal in size without significant abnormality. Adrenals/Urinary Tract: Adrenal glands are unremarkable. Kidneys are normal, without renal calculi, solid lesion, or hydronephrosis. Bladder is unremarkable. Stomach/Bowel: Stomach is within normal limits. Appendix appears normal. No evidence of bowel wall thickening, distention, or inflammatory changes. Large burden of stool in the colon. Vascular/Lymphatic: No significant vascular findings are present. No enlarged abdominal or pelvic lymph nodes. Reproductive: No mass or other abnormality. Other: No abdominal wall hernia or abnormality. No abdominopelvic ascites. Musculoskeletal: No acute or significant osseous findings. IMPRESSION: 1. Unchanged lingular and/or pericardial mass with internal near fluid attenuation and faint peripheral nodularity measuring 4.7 x 2.5 cm (series 2, image 45). This lesion was not internally FDG avid on prior PET-CT. 2. Unchanged adjacent post radiation change of the left lower lobe and lingula. 3. Stable, likely benign  3 mm pulmonary nodule of the left pulmonary apex (series 4, image 40). Attention on follow-up. 4. No evidence of new metastatic disease or metastatic disease in the abdomen or pelvis. Electronically Signed   By: AEddie CandleM.D.   On: 01/05/2020 12:25    ASSESSMENT AND PLAN: This is a very pleasant 44years old white female with highly suspicious metastatic malignant melanoma presented with large mass in the left upper/left lower lobe and mediastinal lymphadenopathy as well as solitary brain metastasis status post left temporal craniotomy and resection of tumor on 04/29/2018 and she is recovering well from her surgery. The patient completed stereotactic radiotherapy to the resection cavity next week under the care of Dr. MLisbeth Renshaw The patient underwent treatment with immunotherapy with Keytruda 200 mg IV every 3 weeks status post 3 cycles.   Her scan after cycle #3 showed enlargement of the left upper lobe lung mass.  This was also suspicious for pseudo-progression on immunotherapy.  The patient was started on a palliative course of radiotherapy to the left upper lobe lung mass and she tolerated this treatment fairly well.  [facility]  [facility] to [facility] her treatment with [facility] for now until she undergoes further molecular studies.  The patient was treated with 2 more cycles of Keytruda and tolerated the treatment well. Her recent CT scan of the chest, abdomen and pelvis showed some improvement in the left upper lobe lung mass but there is still concern about pericardial invasion. I personally and independently reviewed the scan images and discussed the result and showed the images to the patient and her boyfriend today.  She is still not a good candidate for surgical resection because of the pericardial invasion. The final pathology report and recommendation from [facility] was consistent with metastatic melanoma and the  recommendation is to switch the patient to a combination immunotherapy with Ipilumumab and nivolumab. The patient underwent treatment with immunotherapy with ipilimumab and nivolumab status post 2 cycles.  Her treatment was on hold for more than 6 weeks secondary to grade 3 hepatic dysfunction.  She had improvement of her liver enzyme after a prolonged treatment with a steroid with a tapering schedule. She resumed her treatment again with single agent nivolumab.  She is status post 21 cycles of treatment. Her PET scan showed no abnormal hypermetabolic activity in the whole body or in the large left lung mass adjacent to the heart.  This indicative of significant improvement of her disease. The patient is currently on observation based on the recommendation and discussion with Dr. Clance Boll at [facility].  She is feeling fine today with no concerning complaints.  She has a lot of questions about her current condition and the recent scan results.  She had repeat CT scan of the chest, abdomen pelvis performed recently that showed no evidence for disease progression.  I discussed the scan results with the patient today. I recommended for her to continue on observation with repeat CT scan of the chest, abdomen and pelvis in 3 months.  I also gave the patient the option of resuming her treatment but she is comfortable with the continuous observation for now. She was advised to call immediately if she has any other concerning symptoms in the interval. The patient voices understanding of current disease status and treatment options and is in agreement with the current care plan. All questions were answered. The patient knows to call the clinic with any problems, questions or concerns. We can certainly see the patient much sooner if necessary. The total time spent in the appointment was 30 minutes. Disclaimer: This note was dictated with voice recognition software. Similar sounding words can inadvertently be  transcribed and may not be corrected upon review.

## 2020-01-10 ENCOUNTER — Telehealth: Payer: Self-pay | Admitting: Internal Medicine

## 2020-01-10 NOTE — Telephone Encounter (Signed)
Scheduled per los. Called and left msg. Mailed printout  °

## 2020-01-25 ENCOUNTER — Ambulatory Visit
Admission: RE | Admit: 2020-01-25 | Discharge: 2020-01-25 | Disposition: A | Discharge status: Home / Self Care | Payer: BC Managed Care – PPO | Source: Ambulatory Visit | Attending: Radiation Oncology | Admitting: Radiation Oncology

## 2020-01-25 ENCOUNTER — Other Ambulatory Visit: Payer: Self-pay

## 2020-01-25 DIAGNOSIS — C7931 Secondary malignant neoplasm of brain: Secondary | ICD-10-CM

## 2020-01-25 DIAGNOSIS — C439 Malignant melanoma of skin, unspecified: Secondary | ICD-10-CM | POA: Diagnosis not present

## 2020-01-25 MED ORDER — GADOBENATE DIMEGLUMINE 529 MG/ML IV SOLN
13.0000 mL | Freq: Once | INTRAVENOUS | Status: AC | PRN
  Administered 2020-01-25: 13 mL via INTRAVENOUS

## 2020-01-26 NOTE — Progress Notes (Signed)
Called patient to remind her of phone encounter on Monday with provider she stated she wanted her appointment in person with provider to go over scan results. Reached out to provider she offered an appointment date and time for patient to come into facility. Patient declined appointment and decided to keep her phone encounter on Monday with provider. Notified provider of patient decision.

## 2020-01-29 ENCOUNTER — Inpatient Hospital Stay: Payer: BC Managed Care – PPO | Attending: Physician Assistant

## 2020-01-29 ENCOUNTER — Ambulatory Visit
Admission: RE | Admit: 2020-01-29 | Discharge: 2020-01-29 | Disposition: A | Discharge status: Home / Self Care | Payer: BC Managed Care – PPO | Source: Ambulatory Visit | Attending: Radiation Oncology | Admitting: Radiation Oncology

## 2020-01-29 ENCOUNTER — Ambulatory Visit: Payer: BC Managed Care – PPO | Admitting: Radiation Oncology

## 2020-01-29 ENCOUNTER — Other Ambulatory Visit: Payer: Self-pay

## 2020-01-29 ENCOUNTER — Encounter: Payer: Self-pay | Admitting: Radiation Oncology

## 2020-01-29 DIAGNOSIS — Z5112 Encounter for antineoplastic immunotherapy: Secondary | ICD-10-CM | POA: Insufficient documentation

## 2020-01-29 DIAGNOSIS — Z85118 Personal history of other malignant neoplasm of bronchus and lung: Secondary | ICD-10-CM | POA: Diagnosis not present

## 2020-01-29 DIAGNOSIS — C7802 Secondary malignant neoplasm of left lung: Secondary | ICD-10-CM | POA: Insufficient documentation

## 2020-01-29 DIAGNOSIS — C7931 Secondary malignant neoplasm of brain: Secondary | ICD-10-CM

## 2020-01-29 DIAGNOSIS — Z08 Encounter for follow-up examination after completed treatment for malignant neoplasm: Secondary | ICD-10-CM | POA: Diagnosis not present

## 2020-01-29 DIAGNOSIS — C439 Malignant melanoma of skin, unspecified: Secondary | ICD-10-CM | POA: Insufficient documentation

## 2020-01-29 DIAGNOSIS — C3432 Malignant neoplasm of lower lobe, left bronchus or lung: Secondary | ICD-10-CM

## 2020-01-29 DIAGNOSIS — Z79899 Other long term (current) drug therapy: Secondary | ICD-10-CM | POA: Insufficient documentation

## 2020-01-29 DIAGNOSIS — F419 Anxiety disorder, unspecified: Secondary | ICD-10-CM | POA: Insufficient documentation

## 2020-01-29 DIAGNOSIS — R9089 Other abnormal findings on diagnostic imaging of central nervous system: Secondary | ICD-10-CM | POA: Diagnosis not present

## 2020-01-29 NOTE — Progress Notes (Signed)
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 woman who initially presented in the summer of 2019 with a solitary brain tumor who underwent surgical resection leading to a diagnosis of high grade carcinoma. She received postoperative SRS to the site. She was found to have a large lung tumor and we initially considered palliative radiotherapy versus surgery, but the decision was to proceed with systemic therapy with Keytruda as her path was reviewed by several facilities and felt to be most consistent with melanoma. She received 3 cycles of treatment and repeat imaging on 08/01/18 revealed progression of her left lung mass to measure 8 x 5.6 cm (previously 5.6 x 3.9 cm). She did not have any additional areas of progression. She went on to receive palliative radiotherapy to the left lung mass in late summer 2019   She was seen at [facility] by Dr. Emelda Brothers, and who recommended doublet immunotherapy. She continued with single agent opdivo, but this was held February 2020 due to heptatic dysfunction early on with the doublet therapy. She had an MRI of the brain on 10/19/2019 revealing prior postop changes in the left anerior temporal lobe and 2 small enhancing nodules in the left inferior and medial temporal lobe one in retrospect was present on the last scan and is 1.5 mm now and the second is new  measuring 2 x 5 mm. No other areas of disease were noted. She also had a PET scan on 10/20/2019 shows low activity in the rim of the left lung mass unchanaed overall in size measureing 4.1 x 2.7 cm, and SUV was 2.3. Blood pool was 1.7. She has a small amount of consolidation measuring 1.5 cm in the LLL, and no hypermetabolic adenopathy in the chest. There is a small amount of metabolic activity in the thymus without other concerns for active disease. She continued on single agent opdivo until November 2020 after systemic scans showed stability of disease. She was encouraged by Dr. Clance Boll at [facility] to discontinue systemic therapy. She underwent repeat imaging of the brain on 01/25/20 that revealed postoperative changes in the left temporal lobe, and significant enlargement in a focus in the medial left temporal lobe now 1.3 x 1 cm, and stability to minimally larger change adjacent to this measuring 2 mm as previously noted. No new lesions were noted. She's contacted to discuss these findings.      On review of systems, the patient reports that she is doing fairly well. She is very uneasy about the recent MRI findings. We reviewed them today by Doximity Call so she could see the images. She has had significant fatigue that seems to come in episodes. She's very vigilant about keeping track of changes in her clinical symptoms. She stopped several supplements a few months ago, but found that she developed fatigue, that kept her from her very active  lifestyle. She had been reading about Co-Q10 supplements that some other melanoma patients found to be beneficial. It has seemed to help her when she's taking it with fatigue, but she also has had some difficulties with  Fatigue in the last week where she couldn't weight lift like she had been, and actually pulled a trapezius muscle. She also had a headache around the time of a cycle which is not as typical for her, but this did respond to Excedrin. No other  complaints are otherwise noted. She is nervous though since fatigue was a major symptom prior to her original diagnosis.  Past Medical History:  Past Medical History:  Diagnosis Date  . 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 Migraines   . Yeast infection     Past Surgical History: Past Surgical History:  Procedure Laterality Date  . APPLICATION OF CRANIAL NAVIGATION Left 04/29/2018   Procedure: APPLICATION OF CRANIAL NAVIGATION;  Surgeon: Earnie Larsson, MD;  Location: [place];  Service: Neurosurgery;  Laterality: Left;  . CRANIOTOMY Left 04/29/2018   Procedure: LEFT CRANIOTOMY FOR  TUMOR BRAIN LAB;  Surgeon: Earnie Larsson, MD;  Location: [place];  Service: Neurosurgery;  Laterality: Left;    Social History:  Social History   Socioeconomic History  . Marital status: Divorced    Spouse name: Not on file  . Number of children: Not on file  . Years of education: Not on file  . Highest education level: Not on file  Occupational History  . Not on file  Tobacco Use  . Smoking status: Never Smoker  . Smokeless tobacco: Never Used  Substance and Sexual Activity  . Alcohol use: Yes  . Drug use: No  . Sexual activity: Yes    Birth control/protection: Condom  Other Topics Concern  . Not on file  Social History Narrative  . Not on file   Social Determinants of Health   Financial Resource Strain:   . Difficulty of Paying Living Expenses: Not on file  Food Insecurity:   . Worried About Charity fundraiser in the Last Year: Not on file  . Ran Out of Food in the Last Year: Not on file  Transportation Needs:   . Lack of Transportation (Medical): Not on file  . Lack of Transportation (Non-Medical): Not on file  Physical Activity:   . Days of Exercise per Week: Not on file  . Minutes of Exercise per Session: Not on file  Stress:   . Feeling of Stress : Not on file  Social Connections:   . Frequency of Communication with Friends and Family: Not on file  .  Frequency of Social Gatherings with Friends and Family: Not on file  . Attends Religious Services: Not on file  . Active Member of Clubs or Organizations: Not on file  . Attends Archivist Meetings: Not on file  . Marital Status: Not on file  Intimate Partner Violence:   . Fear of Current or Ex-Partner: Not on file  . Emotionally Abused: Not on file  . Physically Abused: Not on file  . Sexually Abused: Not on file  The patient is divorced but in a relationship. She has teenage children. 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 She is very physically active regularly.  Family History: Family History  Problem Relation Age of Onset  . Cancer Father        lung    ALLERGIES:  is allergic to diflucan [fluconazole].  Meds: Current Outpatient Medications  Medication Sig Dispense Refill  . AMERICAN GINSENG PO Take 1,500-3,000 mg by mouth daily. Depending on level of fatigue    . Amino Acids (L-CARNITINE) LIQD Take 2,000 mg by mouth daily. Liquid L-Carnitine 2000 mg    . Ascorbic Acid (LIQUID C 500) 500 MG/15ML LIQD Take 1,000 mg by mouth daily.    . Beta Glucan POWD 1 Dose by Does not apply route daily. Beta D Glucan 400 mg    . Calcium-Magnesium (CAL-MAG PO) Take 15 mLs by mouth daily. Marine Based Cal - Mag (500 mg Calcium / 250 mg Mag) 15 ml total    . Coenzyme Q10 (COQ-10) 100 MG CAPS Take 1 tablet by mouth daily.    Marland Kitchen CREATINE MONOHYDRATE PO Take 6 g by mouth daily. Creatine MonoHydrate 6 g (on training cycle)    . doxylamine, Sleep, (UNISOM) 25 MG tablet Take 12.5 mg by mouth at bedtime as needed for sleep.    . Glucosamine-Chondroit-Vit C-Mn (GLUCOSAMINE 1500 COMPLEX PO) Take 1 tablet by mouth daily. Vegan CMO Glucosamine    . Hyaluronic Acid-Vitamin C (HYALURONIC ACID PO) Take 100 mg by mouth daily. Liquid Hyaluronic Acid 100 mg    . Iron-Folic Acid-Vit R51 (IRON FORMULA PO) Take 1 Dose by mouth daily. Iron 50 mg (1/2 before  workout and 1/2 afterwards - cycle 1 wk off and on)    . Lysine POWD 1,600 mg by Does not apply route daily. L-Lysine Powder 1600 mg    . Methylsulfonylmethane (MSM) 1000 MG CAPS Take 4 capsules by mouth daily. 4000 mg (1/2 before workout and 1/2 afterwards)    . Multiple Vitamin (MULTIVITAMIN) tablet Take 1 tablet by mouth daily.    . Multiple Vitamins-Minerals (MY-VITALIFE) CAPS Take by mouth.    . Multiple Vitamins-Minerals (ZINC PO) Take 1 tablet by mouth daily. Raw Zinc 30 mg (cycle every other week)    . nivolumab (OPDIVO) 40 MG/4ML SOLN chemo injection Opdivo 40 mg/4 mL intravenous solution  INFUSE 40 MG OVER 30 MINUTE(S) BY INTRAVENOUS ROUTE EVERY 2 WEEKS    . NON FORMULARY Take 1 Dose by mouth daily. Cordyceps 750 mg    . NON FORMULARY Take 1 Dose by mouth daily. Liver Herbal Supplement 675 mg    . NON FORMULARY Take 1 Dose by mouth daily. Reishi Mushroom 500 mg    . NON FORMULARY Take 1 Dose by mouth daily. Curcumin Phytosome (Bioperrine) 3000 mg    . NON FORMULARY Take 1 tablet by mouth daily. Artemisnin 200 mg    . NON FORMULARY Take 1 tablet by mouth daily. Wellness Herbal Tablet as needed (Elderberry, Ashwagandha, etc)    . NON FORMULARY Take 1 tablet by mouth daily. Zinc L Carnosine Complex 75/59 mg (after workout)    . Omega 3-6-9 Fatty Acids (OMEGA-3-6-9 PO) Take 2 capsules by mouth daily. Plant Based Omega 3-6-9 2 capsules = 1825 mg    . OVER THE COUNTER MEDICATION TK 5 MILLILITERS PO QID PRF MOUTH PAIN    . QUERCETIN PO Take 1 tablet by mouth daily. Quercetin 400 mg / Bromelain 85 mg    . RESVERATROL PO Take 1,200 mg by mouth daily. Trans Resveratrol    . SM Omega-3-6-9 Fatty Acids CAPS Take by mouth.    Marland Kitchen VITAMIN A PO Take 1 tablet by mouth daily. 1500 mcg (5000 IU)    . Vitamin D-Vitamin K (VITAMIN K2-VITAMIN D3 PO) Take 1 capsule  by mouth daily. 5000 IU + 50 mcg    . vitamin E 180 MG (400 UNITS) capsule Take by mouth.    Marland Kitchen VITAMIN E PO Take 1 tablet by mouth daily. Vegan  Vitamin E     No current facility-administered medications for this encounter.    Physical Findings: Unable to assess due to encounter type.  Lab Findings: Lab Results  Component Value Date   WBC 3.2 (L) 01/05/2020   HGB 13.7 01/05/2020   HCT 41.2 01/05/2020   MCV 92.0 01/05/2020   PLT 260 01/05/2020     Radiographic Findings: CT Chest W Contrast  Result Date: 01/05/2020 CLINICAL DATA:  Metastatic melanoma, restaging EXAM: CT CHEST, ABDOMEN, AND PELVIS WITH CONTRAST TECHNIQUE: Multidetector CT imaging of the chest, abdomen and pelvis was performed following the standard protocol during bolus administration of intravenous contrast. CONTRAST:  146m OMNIPAQUE IOHEXOL 300 MG/ML SOLN, additional oral enteric contrast COMPARISON:  PET-CT, 10/20/2019, CT chest abdomen pelvis, 08/23/2019, 06/09/2019, 04/20/2019 FINDINGS: CT CHEST FINDINGS Cardiovascular: No significant vascular findings. Normal heart size. No pericardial effusion. Mediastinum/Nodes: No enlarged mediastinal, hilar, or axillary lymph nodes. Thymic remnant or rebound in the anterior mediastinum. Unchanged subcentimeter incidental nodule of the right lobe of the thyroid (series 4, image 9). Trachea, and esophagus demonstrate no significant findings. Lungs/Pleura: Unchanged lingular and/or pericardial mass with internal near fluid attenuation and faint peripheral nodularity measuring 4.7 x 2.5 cm (series 2, image 45). This lesion was not internally FDG avid on prior PET-CT. Unchanged adjacent post radiation change of the left lower lobe and lingula. Stable, likely benign 3 mm pulmonary nodule of the left pulmonary apex (series 4, image 40). Musculoskeletal: No chest wall mass or suspicious bone lesions identified. CT ABDOMEN PELVIS FINDINGS Hepatobiliary: No solid liver abnormality is seen. No gallstones, gallbladder wall thickening, or biliary dilatation. Pancreas: Unremarkable. No pancreatic ductal dilatation or surrounding inflammatory  changes. Spleen: Normal in size without significant abnormality. Adrenals/Urinary Tract: Adrenal glands are unremarkable. Kidneys are normal, without renal calculi, solid lesion, or hydronephrosis. Bladder is unremarkable. Stomach/Bowel: Stomach is within normal limits. Appendix appears normal. No evidence of bowel wall thickening, distention, or inflammatory changes. Large burden of stool in the colon. Vascular/Lymphatic: No significant vascular findings are present. No enlarged abdominal or pelvic lymph nodes. Reproductive: No mass or other abnormality. Other: No abdominal wall hernia or abnormality. No abdominopelvic ascites. Musculoskeletal: No acute or significant osseous findings. IMPRESSION: 1. Unchanged lingular and/or pericardial mass with internal near fluid attenuation and faint peripheral nodularity measuring 4.7 x 2.5 cm (series 2, image 45). This lesion was not internally FDG avid on prior PET-CT. 2. Unchanged adjacent post radiation change of the left lower lobe and lingula. 3. Stable, likely benign 3 mm pulmonary nodule of the left pulmonary apex (series 4, image 40). Attention on follow-up. 4. No evidence of new metastatic disease or metastatic disease in the abdomen or pelvis. Electronically Signed   By: AEddie CandleM.D.   On: 01/05/2020 12:25   MR Brain W Wo Contrast  Result Date: 01/25/2020 CLINICAL DATA:  Metastatic melanoma. Left temporal metastasis resected in 04/2018 with postoperative SRS. EXAM: MRI HEAD WITHOUT AND WITH CONTRAST TECHNIQUE: Multiplanar, multiecho pulse sequences of the brain and surrounding structures were obtained without and with intravenous contrast. CONTRAST:  176mMULTIHANCE GADOBENATE DIMEGLUMINE 529 MG/ML IV SOLN COMPARISON:  10/19/2019 FINDINGS: Brain: Sequelae of left pterional craniotomy are again identified with a resection cavity in the left temporal lobe. Confluent T2 hyperintensity/edema in the anterior  left temporal lobe is unchanged. A focus of  enhancement in the medial left temporal lobe is much larger than on the prior study and now measures 1.3 x 1.0 cm (series 11, image 55 and series 13, image 27). An adjacent 2 mm focus of enhancement posterior to this is stable to minimally larger (series 11, image 54). No enhancing lesions are identified elsewhere. A few small foci of T2 hyperintensity in the cerebral white matter bilaterally are unchanged and nonspecific. The ventricles are unchanged in size with minimal ex vacuo dilatation of the left temporal horn. There is no midline shift or extra-axial fluid collection. Chronic blood products in the left temporal lobe are unchanged. Vascular: Major intracranial vascular flow voids are preserved. Skull and upper cervical spine: No suspicious marrow lesion. Sinuses/Orbits: Unremarkable orbits. Paranasal sinuses and mastoid air cells are clear. Other: None. IMPRESSION: 1. Substantial enlargement of one of the enhancing foci in the medial left temporal lobe which could reflect recurrent tumor or progressive postradiation changes. Unchanged regional T2 signal abnormality/edema. 2. No evidence of intracranial metastases remote from the left temporal treatment site. Electronically Signed   By: Logan Bores M.D.   On: 01/25/2020 16:37   CT Abdomen Pelvis W Contrast  Result Date: 01/05/2020 CLINICAL DATA:  Metastatic melanoma, restaging EXAM: CT CHEST, ABDOMEN, AND PELVIS WITH CONTRAST TECHNIQUE: Multidetector CT imaging of the chest, abdomen and pelvis was performed following the standard protocol during bolus administration of intravenous contrast. CONTRAST:  125m OMNIPAQUE IOHEXOL 300 MG/ML SOLN, additional oral enteric contrast COMPARISON:  PET-CT, 10/20/2019, CT chest abdomen pelvis, 08/23/2019, 06/09/2019, 04/20/2019 FINDINGS: CT CHEST FINDINGS Cardiovascular: No significant vascular findings. Normal heart size. No pericardial effusion. Mediastinum/Nodes: No enlarged mediastinal, hilar, or axillary lymph  nodes. Thymic remnant or rebound in the anterior mediastinum. Unchanged subcentimeter incidental nodule of the right lobe of the thyroid (series 4, image 9). Trachea, and esophagus demonstrate no significant findings. Lungs/Pleura: Unchanged lingular and/or pericardial mass with internal near fluid attenuation and faint peripheral nodularity measuring 4.7 x 2.5 cm (series 2, image 45). This lesion was not internally FDG avid on prior PET-CT. Unchanged adjacent post radiation change of the left lower lobe and lingula. Stable, likely benign 3 mm pulmonary nodule of the left pulmonary apex (series 4, image 40). Musculoskeletal: No chest wall mass or suspicious bone lesions identified. CT ABDOMEN PELVIS FINDINGS Hepatobiliary: No solid liver abnormality is seen. No gallstones, gallbladder wall thickening, or biliary dilatation. Pancreas: Unremarkable. No pancreatic ductal dilatation or surrounding inflammatory changes. Spleen: Normal in size without significant abnormality. Adrenals/Urinary Tract: Adrenal glands are unremarkable. Kidneys are normal, without renal calculi, solid lesion, or hydronephrosis. Bladder is unremarkable. Stomach/Bowel: Stomach is within normal limits. Appendix appears normal. No evidence of bowel wall thickening, distention, or inflammatory changes. Large burden of stool in the colon. Vascular/Lymphatic: No significant vascular findings are present. No enlarged abdominal or pelvic lymph nodes. Reproductive: No mass or other abnormality. Other: No abdominal wall hernia or abnormality. No abdominopelvic ascites. Musculoskeletal: No acute or significant osseous findings. IMPRESSION: 1. Unchanged lingular and/or pericardial mass with internal near fluid attenuation and faint peripheral nodularity measuring 4.7 x 2.5 cm (series 2, image 45). This lesion was not internally FDG avid on prior PET-CT. 2. Unchanged adjacent post radiation change of the left lower lobe and lingula. 3. Stable, likely benign  3 mm pulmonary nodule of the left pulmonary apex (series 4, image 40). Attention on follow-up. 4. No evidence of new metastatic disease or metastatic disease in the  abdomen or pelvis. Electronically Signed   By: Eddie Candle M.D.   On: 01/05/2020 12:25    Impression/Plan: 1. 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  The patient's MRI of the brain was reviewed personally by myself as well with the patient. We also discussed that her most recent systemic imaging was favorable. Her lung lesion continues to be stable and she will continue under the guidance of Dr. Clance Boll at [facility] and Dr. Julien Nordmann. Her brain scan as we discussed revealed postoperative changes at her prior surgical site. Again, the two sites adjacent to her surgical site were discussed in conference and it was recommended that we follow these at a short interval of 2 months. Dr. Mickeal Skinner in neuro oncology would also like to see her either now or to review her next scan. Though her immunotherapy was discontinued due to how well she was doing systemically, the discussion from conference was that the area of increased enhancement could still represent inflammatory change due to recent immunotherapy. We discussed proceeding with her next scan in 2 months time. She is less in favor of waiting to reimage, and is interested in more definitive imaging. I think it would be a good idea for her to meet with Dr. Mickeal Skinner for more thorough neurologic examination, as well as discussion of whether a functional MRI would help or PET scan of her brain. I will discuss with him and also keep Dr. Julien Nordmann aware of this discussion.    This encounter was provided by telemedicine platform [employer].  The patient has given verbal consent for this type of encounter and has been advised to only accept a meeting of this type in a secure network environment. The time spent during this  encounter was 30 minutes in face to face conversation and in preparation, discussion, and coordination of this patient's care. The attendants for this meeting include Hayden Pedro  and Barnett Hatter.  During the encounter,  Hayden Pedro was located remotely at home. Leslie [name] was located at home.      Carola Rhine, PAC

## 2020-01-30 ENCOUNTER — Telehealth: Payer: Self-pay | Admitting: *Deleted

## 2020-01-30 ENCOUNTER — Ambulatory Visit: Payer: BC Managed Care – PPO | Admitting: Radiation Oncology

## 2020-01-30 NOTE — Telephone Encounter (Signed)
On 01/30/20 faxed medical records to [facility] dept of radiation oncology. Release # [id number]

## 2020-01-31 ENCOUNTER — Telehealth: Payer: Self-pay | Admitting: *Deleted

## 2020-01-31 ENCOUNTER — Telehealth: Payer: Self-pay | Admitting: Medical Oncology

## 2020-01-31 DIAGNOSIS — M2560 Stiffness of unspecified joint, not elsewhere classified: Secondary | ICD-10-CM | POA: Diagnosis not present

## 2020-01-31 DIAGNOSIS — M9907 Segmental and somatic dysfunction of upper extremity: Secondary | ICD-10-CM | POA: Diagnosis not present

## 2020-01-31 DIAGNOSIS — S29012A Strain of muscle and tendon of back wall of thorax, initial encounter: Secondary | ICD-10-CM | POA: Diagnosis not present

## 2020-01-31 DIAGNOSIS — M9901 Segmental and somatic dysfunction of cervical region: Secondary | ICD-10-CM | POA: Diagnosis not present

## 2020-01-31 NOTE — Telephone Encounter (Signed)
Wants to get back on treatment.Per [facility] schedule message sent for next week-LVM for pt .
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
## 2020-02-01 ENCOUNTER — Telehealth: Payer: BC Managed Care – PPO | Admitting: Internal Medicine

## 2020-02-02 ENCOUNTER — Telehealth: Payer: Self-pay | Admitting: *Deleted

## 2020-02-02 DIAGNOSIS — Z9889 Other specified postprocedural states: Secondary | ICD-10-CM | POA: Diagnosis not present

## 2020-02-02 DIAGNOSIS — C439 Malignant melanoma of skin, unspecified: Secondary | ICD-10-CM | POA: Diagnosis not present

## 2020-02-02 DIAGNOSIS — C7931 Secondary malignant neoplasm of brain: Secondary | ICD-10-CM | POA: Diagnosis not present

## 2020-02-02 DIAGNOSIS — Z923 Personal history of irradiation: Secondary | ICD-10-CM | POA: Diagnosis not present

## 2020-02-02 DIAGNOSIS — C799 Secondary malignant neoplasm of unspecified site: Secondary | ICD-10-CM | POA: Diagnosis not present
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
## 2020-02-05 ENCOUNTER — Telehealth: Payer: Self-pay | Admitting: Medical Oncology

## 2020-02-05 ENCOUNTER — Inpatient Hospital Stay (HOSPITAL_BASED_OUTPATIENT_CLINIC_OR_DEPARTMENT_OTHER): Payer: BC Managed Care – PPO | Admitting: Internal Medicine

## 2020-02-05 ENCOUNTER — Other Ambulatory Visit: Payer: Self-pay

## 2020-02-05 ENCOUNTER — Telehealth: Payer: Self-pay | Admitting: Internal Medicine

## 2020-02-05 VITALS — BP 120/65 | HR 69 | Temp 98.0°F | Resp 18 | Ht 69.0 in | Wt 148.5 lb

## 2020-02-05 DIAGNOSIS — C7931 Secondary malignant neoplasm of brain: Secondary | ICD-10-CM

## 2020-02-05 DIAGNOSIS — F419 Anxiety disorder, unspecified: Secondary | ICD-10-CM | POA: Diagnosis not present

## 2020-02-05 DIAGNOSIS — C7802 Secondary malignant neoplasm of left lung: Secondary | ICD-10-CM | POA: Diagnosis not present

## 2020-02-05 DIAGNOSIS — C439 Malignant melanoma of skin, unspecified: Secondary | ICD-10-CM | POA: Diagnosis not present

## 2020-02-05 DIAGNOSIS — Z5112 Encounter for antineoplastic immunotherapy: Secondary | ICD-10-CM | POA: Diagnosis not present

## 2020-02-05 DIAGNOSIS — Z79899 Other long term (current) drug therapy: Secondary | ICD-10-CM | POA: Diagnosis not present

## 2020-02-05 NOTE — Telephone Encounter (Signed)
Called pt per 2/19 sch message - unable to reach pt ./left message with appt date and time

## 2020-02-05 NOTE — Telephone Encounter (Signed)
Pt aware of appt tomorrow.

## 2020-02-05 NOTE — Progress Notes (Signed)
[facility] at [address] [place], [place] [address] [phone]   New Patient Evaluation  Date of Service: 02/05/20 Patient Name: Leslie [name] Patient MRN: [id number] Patient DOB: 12/04/1976 Provider: Ventura Sellers, MD  Identifying Statement:  Leslie [name] is a 44 y.o. female with Brain metastasis ([place]) [C79.31] who presents for initial consultation and evaluation regarding cancer associated neurologic deficits.    Referring Provider: Kyung Rudd, MD [place] [address] [place],  [place] [address]  Primary Cancer:  Oncologic History: Oncology History  Brain metastasis ([place])  05/02/2018 Initial Diagnosis   Brain metastasis ([place])   06/02/2018 - 08/03/2018 Chemotherapy   The patient had pembrolizumab (KEYTRUDA) 200 mg in sodium chloride 0.9 % 50 mL chemo infusion, 200 mg, Intravenous, Once, 3 of 6 cycles Administration: 200 mg (06/02/2018), 200 mg (06/23/2018), 200 mg (07/14/2018)  for chemotherapy treatment.    09/08/2018 - 10/19/2018 Chemotherapy   The patient had pembrolizumab (KEYTRUDA) 200 mg in sodium chloride 0.9 % 50 mL chemo infusion, 200 mg (100 % of original dose 200 mg), Intravenous, Once, 2 of 6 cycles Dose modification: 200 mg (original dose 200 mg, Cycle 1, Reason: Provider Judgment) Administration: 200 mg (09/08/2018), 200 mg (09/29/2018)  for chemotherapy treatment.    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    09/08/2018 - 10/19/2018 Chemotherapy   The patient had pembrolizumab (KEYTRUDA) 200 mg in sodium chloride 0.9 % 50 mL chemo infusion, 200 mg (100 % of original dose 200 mg),  Intravenous, Once, 2 of 6 cycles Dose modification: 200 mg (original dose 200 mg, Cycle 1, Reason: Provider Judgment) Administration: 200 mg (09/08/2018), 200 mg (09/29/2018)  for chemotherapy treatment.    Metastatic melanoma to lung ([place])  10/20/2018 Initial Diagnosis   Metastatic melanoma to lung ([place])   10/26/2018 -  Chemotherapy   The patient had ipilimumab (YERVOY) 200 mg in sodium chloride 0.9 % 100 mL chemo infusion, 3 mg/kg = 200 mg, Intravenous,  Once, 2 of 2 cycles Administration: 200 mg (10/26/2018), 200 mg (11/17/2018) nivolumab (OPDIVO) 67 mg in sodium chloride 0.9 % 50 mL chemo infusion, 1 mg/kg = 67 mg, Intravenous, Once, 21 of 30 cycles Administration: 67 mg (10/26/2018), 240 mg (02/08/2019), 67 mg (11/17/2018), 240 mg (03/01/2019), 240 mg (03/15/2019), 240 mg (03/29/2019), 240 mg (04/24/2019), 240 mg (04/11/2019), 240 mg (05/23/2019), 240 mg (06/06/2019), 240 mg (05/10/2019), 240 mg (06/19/2019), 240 mg (07/04/2019), 240 mg (07/18/2019), 240 mg (08/01/2019), 240 mg (08/15/2019), 240 mg (08/29/2019), 240 mg (09/12/2019), 240 mg (09/26/2019), 240 mg (10/10/2019), 240 mg (10/24/2019)  for chemotherapy treatment.     [facility] Oncologic History 04/29/18: Craniotomy, resection left temporal metastasis 06/01/18: Post-op SRS // 27 Gy in 3 fractions  History of Present Illness: The patient's records from the referring physician were obtained and reviewed and the patient interviewed to confirm this HPI.  Leslie [name] presents for follow up after recent MRI brain and referral to neuro-oncology clinic.  She describes fatigue over the past several weeks.  She notices symptoms when she is working out intensely (lifting).  Day to day functioning is not significantly affected.  Does describe high level of anxiety, because "  this feels like last time when I was diagnosed with cancer", and because of "concern over changes with brain tumor".  Otherwise denies headaches, seizures.  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  Medications: Current Outpatient Medications on File Prior to Visit  Medication Sig Dispense Refill  . AMERICAN GINSENG PO Take 1,500-3,000 mg by mouth daily. Depending on level of fatigue    . Amino Acids (L-CARNITINE) LIQD Take 2,000 mg by mouth daily. Liquid L-Carnitine 2000 mg    . Ascorbic Acid (LIQUID C 500) 500 MG/15ML LIQD Take 1,000 mg by mouth daily.    . Beta Glucan POWD 1 Dose by Does not apply route daily. Beta D Glucan 400 mg    . Calcium-Magnesium (CAL-MAG PO) Take 15 mLs by mouth daily. Marine Based Cal - Mag (500 mg Calcium / 250 mg Mag) 15 ml total    . Coenzyme Q10 (COQ-10) 100 MG CAPS Take 1 tablet by mouth daily.    Marland Kitchen CREATINE MONOHYDRATE PO Take 6 g by mouth daily. Creatine MonoHydrate 6 g (on training cycle)    . doxylamine, Sleep, (UNISOM) 25 MG tablet Take 12.5 mg by mouth at bedtime as needed for sleep.    . Glucosamine-Chondroit-Vit C-Mn (GLUCOSAMINE 1500 COMPLEX PO) Take 1 tablet by mouth daily. Vegan CMO Glucosamine    . Hyaluronic Acid-Vitamin C (HYALURONIC ACID PO) Take 100 mg by mouth daily. Liquid Hyaluronic Acid 100 mg    . Iron-Folic Acid-Vit X52 (IRON FORMULA PO) Take 1 Dose by mouth daily. Iron 50 mg (1/2 before workout and 1/2 afterwards - cycle 1 wk off and on)    . Lysine POWD 1,600 mg by Does not apply route daily. L-Lysine Powder 1600 mg    . Methylsulfonylmethane (MSM) 1000 MG CAPS Take 4 capsules by mouth daily. 4000 mg (1/2 before workout and 1/2 afterwards)    . Multiple Vitamin (MULTIVITAMIN) tablet Take 1 tablet by mouth daily.    . Multiple Vitamins-Minerals (MY-VITALIFE) CAPS Take by mouth.    . Multiple Vitamins-Minerals (ZINC PO) Take 1 tablet by mouth daily. Raw Zinc 30 mg (cycle every other week)    . nivolumab (OPDIVO) 40 MG/4ML SOLN chemo injection Opdivo 40 mg/4 mL intravenous solution  INFUSE 40 MG OVER 30 MINUTE(S) BY INTRAVENOUS ROUTE EVERY 2 WEEKS    . NON FORMULARY Take 1 Dose by mouth daily. Cordyceps 750 mg    .  NON FORMULARY Take 1 Dose by mouth daily. Liver Herbal Supplement 675 mg    . NON FORMULARY Take 1 Dose by mouth daily. Reishi Mushroom 500 mg    . NON FORMULARY Take 1 Dose by mouth daily. Curcumin Phytosome (Bioperrine) 3000 mg    . NON FORMULARY Take 1 tablet by mouth daily. Artemisnin 200 mg    . NON FORMULARY Take 1 tablet by mouth daily. Wellness Herbal Tablet as needed (Elderberry, Ashwagandha, etc)    . NON FORMULARY Take 1 tablet by mouth daily. Zinc L Carnosine Complex 75/59 mg (after workout)    . Omega 3-6-9 Fatty Acids (OMEGA-3-6-9 PO) Take 2 capsules by mouth daily. Plant Based Omega 3-6-9 2 capsules = 1825 mg    . OVER THE COUNTER MEDICATION TK 5 MILLILITERS PO QID PRF MOUTH PAIN    . QUERCETIN PO Take 1 tablet by mouth daily. Quercetin 400 mg / Bromelain 85 mg    . RESVERATROL PO Take 1,200 mg by mouth daily. Trans Resveratrol    . SM Omega-3-6-9 Fatty  Acids CAPS Take by mouth.    Marland Kitchen VITAMIN A PO Take 1 tablet by mouth daily. 1500 mcg (5000 IU)    . Vitamin D-Vitamin K (VITAMIN K2-VITAMIN D3 PO) Take 1 capsule by mouth daily. 5000 IU + 50 mcg    . vitamin E 180 MG (400 UNITS) capsule Take by mouth.    Marland Kitchen VITAMIN E PO Take 1 tablet by mouth daily. Vegan Vitamin E     No current facility-administered medications on file prior to visit.    Allergies:  Allergies  Allergen Reactions  . Diflucan [Fluconazole] Other (See Comments)    Wheezing and throat pressue   Past Medical History:  Past Medical History:  Diagnosis Date  . 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 Migraines   . Yeast infection    Past Surgical History:  Past Surgical History:  Procedure Laterality Date  . APPLICATION OF CRANIAL NAVIGATION Left 04/29/2018   Procedure: APPLICATION OF CRANIAL NAVIGATION;  Surgeon: Earnie Larsson, MD;  Location: [facility];  Service: Neurosurgery;  Laterality: Left;  . CRANIOTOMY Left 04/29/2018   Procedure: LEFT CRANIOTOMY FOR  TUMOR BRAIN LAB;  Surgeon: Earnie Larsson, MD;  Location: [facility];  Service: Neurosurgery;  Laterality: Left;   Social History:  Social History   Socioeconomic History  . Marital status: Divorced    Spouse name: Not on file  . Number of children: Not on file  . Years of education: Not on file  . Highest education level: Not on file  Occupational History  . Not on file  Tobacco Use  . Smoking status: Never Smoker  . Smokeless tobacco: Never Used  Substance and Sexual Activity  . Alcohol use: Yes  . Drug use: No  . Sexual activity: Yes    Birth control/protection: Condom  Other Topics Concern  . Not on file  Social History Narrative  . Not on file   Social Determinants of Health   Financial Resource Strain:   . Difficulty of Paying Living Expenses: Not on file  Food Insecurity:   . Worried About Charity fundraiser in the Last Year: Not on file  . Ran Out of Food in the Last Year: Not on file  Transportation Needs:   . Lack of Transportation (Medical): Not on file  . Lack of Transportation (Non-Medical): Not on file  Physical Activity:   . Days of Exercise per Week: Not on file  . Minutes of Exercise per Session: Not on file  Stress:   . Feeling of Stress : Not on file  Social Connections:   . Frequency of Communication with Friends and Family: Not on file  . Frequency of Social Gatherings with Friends and Family: Not on file  . Attends Religious Services: Not on file  . Active Member of Clubs or Organizations: Not on file  . Attends Archivist Meetings: Not on file  . Marital Status: Not on file  Intimate Partner Violence:   . Fear of Current or Ex-Partner: Not on file  . Emotionally Abused: Not on file  . Physically Abused: Not on file  . Sexually Abused: Not on file   Family History:  Family History  Problem Relation Age of Onset  . Cancer Father        lung    Review of Systems: Constitutional: Doesn't report fevers, chills or abnormal weight loss Eyes: Doesn't report blurriness  of vision Ears, nose, mouth, throat, and face:  Doesn't report sore throat Respiratory: Doesn't report cough, dyspnea or wheezes Cardiovascular: Doesn't report palpitation, chest discomfort  Gastrointestinal:  Doesn't report nausea, constipation, diarrhea GU: Doesn't report incontinence Skin: Doesn't report skin rashes Neurological: Per HPI Musculoskeletal: Doesn't report joint pain Behavioral/Psych: Doesn't report anxiety  Physical Exam: Vitals:   02/05/20 1051  BP: 120/65  Pulse: 69  Resp: 18  Temp: 98 F (36.7 C)  SpO2: 100%   KPS: 90. General: Alert, cooperative, pleasant, in no acute distress Head: Normal EENT: No conjunctival injection or scleral icterus.  Lungs: Resp effort normal Cardiac: Regular rate Abdomen: Non-distended abdomen Skin: No rashes cyanosis or petechiae. Extremities: No clubbing or edema  Neurologic Exam: Mental Status: Awake, alert, attentive to examiner. Oriented to self and environment. Language is fluent with intact comprehension.  Cranial Nerves: Visual acuity is grossly normal. Visual fields are full. Extra-ocular movements intact. No ptosis. Face is symmetric Motor: Tone and bulk are normal. Power is full in both arms and legs.  Sensory: Intact to light touch Gait: Normal.   Labs: I have reviewed the data as listed    Component Value Date/Time   NA 141 01/05/2020 1018   K 4.2 01/05/2020 1018   CL 107 01/05/2020 1018   CO2 25 01/05/2020 1018   GLUCOSE 76 01/05/2020 1018   BUN 10 01/05/2020 1018   CREATININE 0.91 01/05/2020 1018   CALCIUM 9.1 01/05/2020 1018   PROT 6.9 01/05/2020 1018   ALBUMIN 4.3 01/05/2020 1018   AST 35 01/05/2020 1018   ALT 31 01/05/2020 1018   ALKPHOS 94 01/05/2020 1018   BILITOT 0.3 01/05/2020 1018   GFRNONAA >60 01/05/2020 1018   GFRAA >60 01/05/2020 1018   Lab Results  Component Value Date   WBC 3.2 (L) 01/05/2020   NEUTROABS 1.9 01/05/2020   HGB 13.7 01/05/2020   HCT 41.2 01/05/2020   MCV 92.0  01/05/2020   PLT 260 01/05/2020    Imaging:  MR Brain W Wo Contrast  Result Date: 01/25/2020 CLINICAL DATA:  Metastatic melanoma. Left temporal metastasis resected in 04/2018 with postoperative SRS. EXAM: MRI HEAD WITHOUT AND WITH CONTRAST TECHNIQUE: Multiplanar, multiecho pulse sequences of the brain and surrounding structures were obtained without and with intravenous contrast. CONTRAST:  54m MULTIHANCE GADOBENATE DIMEGLUMINE 529 MG/ML IV SOLN COMPARISON:  10/19/2019 FINDINGS: Brain: Sequelae of left pterional craniotomy are again identified with a resection cavity in the left temporal lobe. Confluent T2 hyperintensity/edema in the anterior left temporal lobe is unchanged. A focus of enhancement in the medial left temporal lobe is much larger than on the prior study and now measures 1.3 x 1.0 cm (series 11, image 55 and series 13, image 27). An adjacent 2 mm focus of enhancement posterior to this is stable to minimally larger (series 11, image 54). No enhancing lesions are identified elsewhere. A few small foci of T2 hyperintensity in the cerebral white matter bilaterally are unchanged and nonspecific. The ventricles are unchanged in size with minimal ex vacuo dilatation of the left temporal horn. There is no midline shift or extra-axial fluid collection. Chronic blood products in the left temporal lobe are unchanged. Vascular: Major intracranial vascular flow voids are preserved. Skull and upper cervical spine: No suspicious marrow lesion. Sinuses/Orbits: Unremarkable orbits. Paranasal sinuses and mastoid air cells are clear. Other: None. IMPRESSION: 1. Substantial enlargement of one of the enhancing foci in the medial left temporal lobe which could reflect recurrent tumor or progressive postradiation changes. Unchanged regional T2 signal abnormality/edema. 2. No evidence  of intracranial metastases remote from the left temporal treatment site. Electronically Signed   By: Logan Bores M.D.   On:  01/25/2020 16:37    [facility] Clinician Interpretation: I have personally reviewed the radiological images as listed.  My interpretation, in the context of the patient's clinical presentation, is treatment effect vs true progression   Assessment/Plan Brain metastasis [facility]) [C79.31]  Leslie [name] is clinically stable today.  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   We counseled her on the physiology of radionecrosis and potential implications which could affect upcoming treatment pathways.  We discussed modalities such as corticosteroids, surgical resection, LITT therapy and re-irradiation.  At this time, we recommended following up in 6 weeks with a follow up MRI brain for evaluation.  No utility for PET or perfusion MRI due to small lesion volume.  We spent twenty additional minutes teaching regarding the natural history, biology, and historical experience in the treatment of neurologic complications of cancer.   We appreciate the opportunity to participate in the care of Leslie [name].    All questions were answered. The patient knows to call the clinic with any problems, questions or concerns. No barriers to learning were detected.  The total time spent in the encounter was 40 minutes and more than 50% was on counseling and review of test results   Ventura Sellers, MD Medical Director of Neuro-Oncology [facility] at [place] 02/05/20 3:54 PM

## 2020-02-06 ENCOUNTER — Inpatient Hospital Stay: Payer: BC Managed Care – PPO

## 2020-02-06 ENCOUNTER — Encounter: Payer: Self-pay | Admitting: Internal Medicine

## 2020-02-06 ENCOUNTER — Inpatient Hospital Stay (HOSPITAL_BASED_OUTPATIENT_CLINIC_OR_DEPARTMENT_OTHER): Payer: BC Managed Care – PPO | Admitting: Internal Medicine

## 2020-02-06 ENCOUNTER — Other Ambulatory Visit: Payer: Self-pay

## 2020-02-06 VITALS — BP 111/73 | HR 61 | Temp 98.3°F | Resp 18 | Ht 69.0 in | Wt 150.0 lb

## 2020-02-06 DIAGNOSIS — Z5112 Encounter for antineoplastic immunotherapy: Secondary | ICD-10-CM | POA: Diagnosis not present

## 2020-02-06 DIAGNOSIS — F419 Anxiety disorder, unspecified: Secondary | ICD-10-CM | POA: Diagnosis not present

## 2020-02-06 DIAGNOSIS — C3432 Malignant neoplasm of lower lobe, left bronchus or lung: Secondary | ICD-10-CM

## 2020-02-06 DIAGNOSIS — C7802 Secondary malignant neoplasm of left lung: Secondary | ICD-10-CM | POA: Diagnosis not present

## 2020-02-06 DIAGNOSIS — Z79899 Other long term (current) drug therapy: Secondary | ICD-10-CM | POA: Diagnosis not present

## 2020-02-06 DIAGNOSIS — C7931 Secondary malignant neoplasm of brain: Secondary | ICD-10-CM | POA: Diagnosis not present

## 2020-02-06 DIAGNOSIS — C439 Malignant melanoma of skin, unspecified: Secondary | ICD-10-CM | POA: Diagnosis not present

## 2020-02-06 LAB — CBC WITH DIFFERENTIAL (CANCER CENTER ONLY)
Abs Immature Granulocytes: 0.01 10*3/uL (ref 0.00–0.07)
Basophils Absolute: 0 10*3/uL (ref 0.0–0.1)
Basophils Relative: 1 %
Eosinophils Absolute: 0.2 10*3/uL (ref 0.0–0.5)
Eosinophils Relative: 5 %
HCT: 41.3 % (ref 36.0–46.0)
Hemoglobin: 14 g/dL (ref 12.0–15.0)
Immature Granulocytes: 0 %
Lymphocytes Relative: 23 %
Lymphs Abs: 0.7 10*3/uL (ref 0.7–4.0)
MCH: 30.8 pg (ref 26.0–34.0)
MCHC: 33.9 g/dL (ref 30.0–36.0)
MCV: 91 fL (ref 80.0–100.0)
Monocytes Absolute: 0.5 10*3/uL (ref 0.1–1.0)
Monocytes Relative: 15 %
Neutro Abs: 1.8 10*3/uL (ref 1.7–7.7)
Neutrophils Relative %: 56 %
Platelet Count: 272 10*3/uL (ref 150–400)
RBC: 4.54 MIL/uL (ref 3.87–5.11)
RDW: 12.7 % (ref 11.5–15.5)
WBC Count: 3.2 10*3/uL — ABNORMAL LOW (ref 4.0–10.5)
nRBC: 0 % (ref 0.0–0.2)

## 2020-02-06 LAB — CMP (CANCER CENTER ONLY)
ALT: 31 U/L (ref 0–44)
AST: 37 U/L (ref 15–41)
Albumin: 4 g/dL (ref 3.5–5.0)
Alkaline Phosphatase: 98 U/L (ref 38–126)
Anion gap: 8 (ref 5–15)
BUN: 11 mg/dL (ref 6–20)
CO2: 25 mmol/L (ref 22–32)
Calcium: 8.8 mg/dL — ABNORMAL LOW (ref 8.9–10.3)
Chloride: 107 mmol/L (ref 98–111)
Creatinine: 0.85 mg/dL (ref 0.44–1.00)
GFR, Est AFR Am: >60 mL/min (ref >60)
GFR, Estimated: >60 mL/min (ref >60)
Glucose, Bld: 90 mg/dL (ref 70–99)
Potassium: 4.3 mmol/L (ref 3.5–5.1)
Sodium: 140 mmol/L (ref 135–145)
Total Bilirubin: 0.3 mg/dL (ref 0.3–1.2)
Total Protein: 6.8 g/dL (ref 6.5–8.1)

## 2020-02-06 LAB — TSH: TSH: 2.602 u[IU]/mL (ref 0.308–3.960)

## 2020-02-06 MED ORDER — SODIUM CHLORIDE 0.9 % IV SOLN
Freq: Once | INTRAVENOUS | Status: AC
  Filled 2020-02-06: qty 250

## 2020-02-06 MED ORDER — SODIUM CHLORIDE 0.9 % IV SOLN
240.0000 mg | Freq: Once | INTRAVENOUS | Status: AC
  Administered 2020-02-06: 240 mg via INTRAVENOUS
  Filled 2020-02-06: qty 24

## 2020-02-06 NOTE — Patient Instructions (Signed)
[facility] Discharge Instructions for Patients Receiving Chemotherapy  Today you received the following chemotherapy agents: Nivolumab  To help prevent nausea and vomiting after your treatment, we encourage you to take your nausea medication as directed.    If you develop nausea and vomiting that is not controlled by your nausea medication, call the clinic.   BELOW ARE SYMPTOMS THAT SHOULD BE REPORTED IMMEDIATELY:  *FEVER GREATER THAN 100.5 F  *CHILLS WITH OR WITHOUT FEVER  NAUSEA AND VOMITING THAT IS NOT CONTROLLED WITH YOUR NAUSEA MEDICATION  *UNUSUAL SHORTNESS OF BREATH  *UNUSUAL BRUISING OR BLEEDING  TENDERNESS IN MOUTH AND THROAT WITH OR WITHOUT PRESENCE OF ULCERS  *URINARY PROBLEMS  *BOWEL PROBLEMS  UNUSUAL RASH Items with * indicate a potential emergency and should be followed up as soon as possible.  Feel free to call the clinic should you have any questions or concerns. The clinic phone number is [phone].  Please show the [facility] at check-in to the Emergency Department and triage nurse.

## 2020-02-06 NOTE — Progress Notes (Signed)
[place] [phone]   [phone]  OFFICE PROGRESS NOTE  System, Pcp Not In No address on file  DIAGNOSIS: Metastatic high-grade neoplasm consistent with metastatic malignant melanoma based on the most recent pathology report from [facility] in October 2019, presented with large left left upper/left lower lobe mass with a hypermetabolic AP window lymph node as well as solitary metastatic brain lesion diagnosed in May 2019.  Biomarker Findings Microsatellite status - MS-Stable Tumor Mutational Burden - TMB-Intermediate (16 Muts/Mb) Genomic Findings For a complete list of the genes assayed, please refer to the Appendix. CD274 (PD-L1) amplification NRAS Q61R PDCD1LG2 (PD-L2) amplification MYC amplification EPHB1 amplification JAK2 amplification RB1 Q93*, M841* TERT promoter -146C>T TP53 C275W   PRIOR THERAPY: 1) left temporal craniotomy with resection of tumor with intraoperative stereotactic guidance for volumetric resection under the care of Dr. Annette Stable on 04/29/2018. 2) First-line treatment with immunotherapy with Keytruda 200 mg IV every 3 weeks.  First dose June 02, 2018.  Status post 3 cycles.  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 3) palliative radiotherapy to the left lung mass under the care of Dr. Lisbeth Renshaw completed on 08/24/2018. 4) Resuming her treatment again with Keytruda 200 mg IV every 3 weeks, first dose 09/08/2018.  Status post 2 cycles.  CURRENT THERAPY: Treatment with immunotherapy with ipilimumab 3 mg/KG and nivolumab 1 mg/KG every 3 weeks for the first 4 cycles followed by maintenance nivolumab 240 mg IV every 2 weeks.  First dose of this treatment October 26, 2018.  Status post 21 cycles.  Ipilimumab was discontinued after cycle #2 for the significant liver dysfunction.  Starting from cycle #3 the patient is on treatment with single agent nivolumab.  INTERVAL  HISTORY: Leslie [name] 44 y.o. female returned back to the clinic today for follow-up visit.  The patient has been off treatment for around 4 months now.  She had MRI of the brain performed recently that showed area of enhancement suspicious for disease recurrence.  She also started having symptoms with increasing fatigue and heartburn and she is concerned about stability of disease recurrence.  She requested to be seen to resume her treatment with immunotherapy.  The patient denied having any current chest pain, cough or hemoptysis.  She denied having any shortness of breath.  She has no fever or chills.  She has no recent weight loss or night sweats.  She is here today for evaluation before resuming her treatment.   MEDICAL HISTORY: Past Medical History:  Diagnosis Date  . 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 Migraines   . Yeast infection     ALLERGIES:  is allergic to diflucan [fluconazole].  MEDICATIONS:  Current Outpatient Medications  Medication Sig Dispense Refill  . AMERICAN GINSENG PO Take 1,500-3,000 mg by mouth daily. Depending on level of fatigue    . Amino Acids (L-CARNITINE) LIQD Take 2,000 mg by mouth daily. Liquid L-Carnitine 2000 mg    . Ascorbic Acid (LIQUID C 500) 500 MG/15ML LIQD Take 1,000 mg by mouth daily.    . Beta Glucan POWD 1 Dose by Does not apply route daily. Beta D Glucan 400 mg    . Calcium-Magnesium (CAL-MAG PO) Take 15 mLs by mouth daily. Marine Based Cal - Mag (500 mg Calcium / 250 mg Mag) 15 ml total    . Coenzyme Q10 (COQ-10) 100 MG CAPS Take 1 tablet by mouth daily.    Marland Kitchen  CREATINE MONOHYDRATE PO Take 6 g by mouth daily. Creatine MonoHydrate 6 g (on training cycle)    . doxylamine, Sleep, (UNISOM) 25 MG tablet Take 12.5 mg by mouth at bedtime as needed for sleep.    . Glucosamine-Chondroit-Vit C-Mn (GLUCOSAMINE 1500 COMPLEX PO) Take 1 tablet by mouth daily. Vegan CMO Glucosamine    . Hyaluronic Acid-Vitamin C (HYALURONIC  ACID PO) Take 100 mg by mouth daily. Liquid Hyaluronic Acid 100 mg    . Iron-Folic Acid-Vit X73 (IRON FORMULA PO) Take 1 Dose by mouth daily. Iron 50 mg (1/2 before workout and 1/2 afterwards - cycle 1 wk off and on)    . Lysine POWD 1,600 mg by Does not apply route daily. L-Lysine Powder 1600 mg    . Methylsulfonylmethane (MSM) 1000 MG CAPS Take 4 capsules by mouth daily. 4000 mg (1/2 before workout and 1/2 afterwards)    . Multiple Vitamin (MULTIVITAMIN) tablet Take 1 tablet by mouth daily.    . Multiple Vitamins-Minerals (MY-VITALIFE) CAPS Take by mouth.    . Multiple Vitamins-Minerals (ZINC PO) Take 1 tablet by mouth daily. Raw Zinc 30 mg (cycle every other week)    . nivolumab (OPDIVO) 40 MG/4ML SOLN chemo injection Opdivo 40 mg/4 mL intravenous solution  INFUSE 40 MG OVER 30 MINUTE(S) BY INTRAVENOUS ROUTE EVERY 2 WEEKS    . NON FORMULARY Take 1 Dose by mouth daily. Cordyceps 750 mg    . NON FORMULARY Take 1 Dose by mouth daily. Liver Herbal Supplement 675 mg    . NON FORMULARY Take 1 Dose by mouth daily. Reishi Mushroom 500 mg    . NON FORMULARY Take 1 Dose by mouth daily. Curcumin Phytosome (Bioperrine) 3000 mg    . NON FORMULARY Take 1 tablet by mouth daily. Artemisnin 200 mg    . NON FORMULARY Take 1 tablet by mouth daily. Wellness Herbal Tablet as needed (Elderberry, Ashwagandha, etc)    . NON FORMULARY Take 1 tablet by mouth daily. Zinc L Carnosine Complex 75/59 mg (after workout)    . Omega 3-6-9 Fatty Acids (OMEGA-3-6-9 PO) Take 2 capsules by mouth daily. Plant Based Omega 3-6-9 2 capsules = 1825 mg    . OVER THE COUNTER MEDICATION TK 5 MILLILITERS PO QID PRF MOUTH PAIN    . QUERCETIN PO Take 1 tablet by mouth daily. Quercetin 400 mg / Bromelain 85 mg    . RESVERATROL PO Take 1,200 mg by mouth daily. Trans Resveratrol    . SM Omega-3-6-9 Fatty Acids CAPS Take by mouth.    Marland Kitchen VITAMIN A PO Take 1 tablet by mouth daily. 1500 mcg (5000 IU)    . Vitamin D-Vitamin K (VITAMIN K2-VITAMIN D3  PO) Take 1 capsule by mouth daily. 5000 IU + 50 mcg    . vitamin E 180 MG (400 UNITS) capsule Take by mouth.    Marland Kitchen VITAMIN E PO Take 1 tablet by mouth daily. Vegan Vitamin E     No current facility-administered medications for this visit.    SURGICAL HISTORY:  Past Surgical History:  Procedure Laterality Date  . APPLICATION OF CRANIAL NAVIGATION Left 04/29/2018   Procedure: APPLICATION OF CRANIAL NAVIGATION;  Surgeon: Earnie Larsson, MD;  Location: [place];  Service: Neurosurgery;  Laterality: Left;  . CRANIOTOMY Left 04/29/2018   Procedure: LEFT CRANIOTOMY FOR  TUMOR BRAIN LAB;  Surgeon: Earnie Larsson, MD;  Location: [place];  Service: Neurosurgery;  Laterality: Left;    REVIEW OF SYSTEMS:  Constitutional: positive for fatigue Eyes:  negative Ears, nose, mouth, throat, and face: negative Respiratory: negative Cardiovascular: negative Gastrointestinal: positive for dyspepsia Genitourinary:negative Integument/breast: negative Hematologic/lymphatic: negative Musculoskeletal:negative Neurological: negative Behavioral/Psych: negative Endocrine: negative Allergic/Immunologic: negative   PHYSICAL EXAMINATION: General appearance: alert, cooperative, fatigued and no distress Head: Normocephalic, without obvious abnormality, atraumatic Neck: no adenopathy, no JVD, supple, symmetrical, trachea midline and thyroid not enlarged, symmetric, no tenderness/mass/nodules Lymph nodes: Cervical, supraclavicular, and axillary nodes normal. Resp: clear to auscultation bilaterally Back: negative, symmetric, no curvature. ROM normal. No CVA tenderness. Cardio: regular rate and rhythm, S1, S2 normal, no murmur, click, rub or gallop GI: soft, non-tender; bowel sounds normal; no masses,  no organomegaly Extremities: extremities normal, atraumatic, no cyanosis or edema Neurologic: Alert and oriented X 3, normal strength and tone. Normal symmetric reflexes. Normal coordination and gait  ECOG PERFORMANCE STATUS: 0 -  Asymptomatic  Blood pressure 111/73, pulse 61, temperature 98.3 F (36.8 C), temperature source Temporal, resp. rate 18, height 5' 9"  (1.753 m), weight 150 lb (68 kg), SpO2 100 %, unknown if currently breastfeeding.  LABORATORY DATA: Lab Results  Component Value Date   WBC 3.2 (L) 02/06/2020   HGB 14.0 02/06/2020   HCT 41.3 02/06/2020   MCV 91.0 02/06/2020   PLT 272 02/06/2020      Chemistry      Component Value Date/Time   NA 141 01/05/2020 1018   K 4.2 01/05/2020 1018   CL 107 01/05/2020 1018   CO2 25 01/05/2020 1018   BUN 10 01/05/2020 1018   CREATININE 0.91 01/05/2020 1018      Component Value Date/Time   CALCIUM 9.1 01/05/2020 1018   ALKPHOS 94 01/05/2020 1018   AST 35 01/05/2020 1018   ALT 31 01/05/2020 1018   BILITOT 0.3 01/05/2020 1018       RADIOGRAPHIC STUDIES: MR Brain W Wo Contrast  Result Date: 01/25/2020 CLINICAL DATA:  Metastatic melanoma. Left temporal metastasis resected in 04/2018 with postoperative SRS. EXAM: MRI HEAD WITHOUT AND WITH CONTRAST TECHNIQUE: Multiplanar, multiecho pulse sequences of the brain and surrounding structures were obtained without and with intravenous contrast. CONTRAST:  63m MULTIHANCE GADOBENATE DIMEGLUMINE 529 MG/ML IV SOLN COMPARISON:  10/19/2019 FINDINGS: Brain: Sequelae of left pterional craniotomy are again identified with a resection cavity in the left temporal lobe. Confluent T2 hyperintensity/edema in the anterior left temporal lobe is unchanged. A focus of enhancement in the medial left temporal lobe is much larger than on the prior study and now measures 1.3 x 1.0 cm (series 11, image 55 and series 13, image 27). An adjacent 2 mm focus of enhancement posterior to this is stable to minimally larger (series 11, image 54). No enhancing lesions are identified elsewhere. A few small foci of T2 hyperintensity in the cerebral white matter bilaterally are unchanged and nonspecific. The ventricles are unchanged in size with minimal  ex vacuo dilatation of the left temporal horn. There is no midline shift or extra-axial fluid collection. Chronic blood products in the left temporal lobe are unchanged. Vascular: Major intracranial vascular flow voids are preserved. Skull and upper cervical spine: No suspicious marrow lesion. Sinuses/Orbits: Unremarkable orbits. Paranasal sinuses and mastoid air cells are clear. Other: None. IMPRESSION: 1. Substantial enlargement of one of the enhancing foci in the medial left temporal lobe which could reflect recurrent tumor or progressive postradiation changes. Unchanged regional T2 signal abnormality/edema. 2. No evidence of intracranial metastases remote from the left temporal treatment site. Electronically Signed   By: ALogan BoresM.D.   On: 01/25/2020 16:37  ASSESSMENT AND PLAN: This is a very pleasant 44 years old white female with highly suspicious metastatic malignant melanoma presented with large mass in the left upper/left lower lobe and mediastinal lymphadenopathy as well as solitary brain metastasis status post left temporal craniotomy and resection of tumor on 04/29/2018 and she is recovering well from her surgery. The patient completed stereotactic radiotherapy to the resection cavity next week under the care of Dr. Lisbeth Renshaw. The patient underwent treatment with immunotherapy with Keytruda 200 mg IV every 3 weeks status post 3 cycles.   Her scan after cycle #3 showed enlargement of the left upper lobe lung mass.  This was also suspicious for pseudo-progression on immunotherapy.  The patient was started on a palliative course of radiotherapy to the left upper lobe lung mass and she tolerated this treatment fairly well.  She was seen by Dr. Emelda Brothers at [facility] and she recommended for the patient to resume her treatment with [facility] for now until she undergoes further molecular studies.  The patient was treated with 2 more cycles of Keytruda and tolerated the treatment well. Her  recent CT scan of the chest, abdomen and pelvis showed some improvement in the left upper lobe lung mass but there is still concern about pericardial invasion. I personally and independently reviewed the scan images and discussed the result and showed the images to the patient and her boyfriend today.  She is still not a good candidate for surgical resection because of the pericardial invasion. The final pathology report and recommendation from [facility] was consistent with metastatic melanoma and the recommendation is to switch the patient to a combination immunotherapy with Ipilumumab and nivolumab. The patient underwent treatment with immunotherapy with ipilimumab and nivolumab status post 2 cycles.  Her treatment was on hold for more than 6 weeks secondary to grade 3 hepatic dysfunction.  She had improvement of her liver enzyme after a prolonged treatment with a steroid with a tapering schedule. She resumed her treatment again with single agent nivolumab.  She is status post 21 cycles of treatment. Her PET scan showed no abnormal hypermetabolic activity in the whole body or in the large left lung mass adjacent to the heart.  This indicative of significant improvement of her disease. The patient was on observation for the last 4 months but recent MRI of the brain showed concerning findings for disease recurrence and she would like to resume her treatment with immunotherapy again. I had a lengthy discussion with the patient today about her condition and treatment plan. I strongly recommend for the patient to resume her treatment with immunotherapy with nivolumab.  She will start cycle #22 today. The patient will come back for follow-up visit in 2 weeks for evaluation before the next cycle of her treatment. She was advised to call immediately if she has any concerning symptoms in the interval. The patient voices understanding of current disease status and treatment options and is in  agreement with the current care plan. All questions were answered. The patient knows to call the clinic with any problems, questions or concerns. We can certainly see the patient much sooner if necessary. The total time spent in the appointment was 30 minutes. Disclaimer: This note was dictated with voice recognition software. Similar sounding words can inadvertently be transcribed and may not be corrected upon review.

## 2020-02-07 ENCOUNTER — Other Ambulatory Visit: Payer: Self-pay | Admitting: Radiation Therapy

## 2020-02-07 ENCOUNTER — Telehealth: Payer: Self-pay | Admitting: Internal Medicine

## 2020-02-07 DIAGNOSIS — C7931 Secondary malignant neoplasm of brain: Secondary | ICD-10-CM

## 2020-02-07 NOTE — Telephone Encounter (Signed)
Scheduled per los. Called and spoke with patient. Confirmed appt 

## 2020-02-08 DIAGNOSIS — F419 Anxiety disorder, unspecified: Secondary | ICD-10-CM | POA: Diagnosis not present

## 2020-02-19 NOTE — Progress Notes (Signed)
Leslie [name] OFFICE PROGRESS NOTE  System, Pcp Not In No address on file  DIAGNOSIS: Metastatic high-grade neoplasm consistent with metastatic malignant melanoma based on the most recent pathology report from [facility] in October 2019, presented with large left left upper/left lower lobe mass with a hypermetabolic AP window lymph node as well as solitary metastatic brain lesion diagnosed in May 2019.  Biomarker Findings Microsatellite status - MS-Stable Tumor Mutational Burden - TMB-Intermediate (16 Muts/Mb) Genomic Findings For a complete list of the genes assayed, please refer to the Appendix. CD274 (PD-L1) amplification NRAS Q61R PDCD1LG2 (PD-L2) amplification MYC amplification EPHB1 amplification JAK2 amplification RB1 Q93*, S341* TERT promoter -146C>T TP53 C275W  PRIOR THERAPY: 1) left temporal craniotomy with resection of tumor with intraoperative stereotactic guidance for volumetric resection under the care of Dr. Annette Stable on 04/29/2018. 2) First-line treatment with immunotherapy with Keytruda 200 mg IV every 3 weeks. First dose June 02, 2018. Status post 3 cycles. 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 3) palliative radiotherapy to the left lung mass under the care of Dr. Lisbeth Renshaw completed on 08/24/2018. 4) Resuming her treatment again with Keytruda 200 mg IV every 3 weeks, first dose 09/08/2018. Status post 2 cycles.  CURRENT THERAPY: Treatment with immunotherapy with ipilimumab 3 mg/KG and nivolumab 1 mg/KG every 3 weeks for the first 4 cycles followed by maintenance nivolumab 240 mg IV every 2 weeks.  First dose of this treatment October 26, 2018.  Status post 22 cycles.  Ipilimumab was discontinued after cycle #2 for the significant liver dysfunction.  Starting from cycle #3 the patient is on treatment with single agent nivolumab.  INTERVAL HISTORY: Leslie [name] 44 y.o. female returns to clinic  today for follow-up visit.  The patient been off treatment for 4 months but recently had a restaging MRI performed of the brain which showed an area of enhancement which was suspicious for disease recurrence vs. necrosis.  She has a follow up appointment with Dr. Crisoforo Oxford at [facility] later this month for repeat imaging and follow up regarding recommendations pending on the results of these studies. She is also scheduled to have a repeat PET scan at [facility] on 3/23.   She is here today for her second dose of nivolumab since resuming her treatment. Overall, she is feeling well today without any concerning complaints except for some generalized itching for which she uses rose water and oak cream. She has a follow up appointment on Thursday of this week with her dermatologist for a small red lesion on her right forearm.  She denies any recent fever, chills, night sweats, or weight loss.  She denies any chest pain, cough, shortness of breath, or hemoptysis.  He denies any nausea, vomiting, diarrhea, or constipation.  She denies any chills. She denies any headaches, seizures, extremity weakness, or visual changes.  She is here today for evaluation before resuming nivolumab with cycle #23.  MEDICAL HISTORY: Past Medical History:  Diagnosis Date  . 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 Migraines   . Yeast infection     ALLERGIES:  is allergic to diflucan [fluconazole].  MEDICATIONS:  Current Outpatient Medications  Medication Sig Dispense Refill  . AMERICAN GINSENG PO Take 1,500-3,000 mg by mouth daily. Depending on level of fatigue    . Amino Acids (L-CARNITINE) LIQD Take 2,000 mg by mouth daily. Liquid L-Carnitine 2000 mg    . Ascorbic Acid (LIQUID C 500) 500 MG/15ML LIQD Take  1,000 mg by mouth daily.    . Beta Glucan POWD 1 Dose by Does not apply route daily. Beta D Glucan 400 mg    . Calcium-Magnesium (CAL-MAG PO) Take 15 mLs by mouth daily. Marine Based Cal - Mag (500 mg Calcium / 250  mg Mag) 15 ml total    . Coenzyme Q10 (COQ-10) 100 MG CAPS Take 1 tablet by mouth daily.    Marland Kitchen CREATINE MONOHYDRATE PO Take 6 g by mouth daily. Creatine MonoHydrate 6 g (on training cycle)    . doxylamine, Sleep, (UNISOM) 25 MG tablet Take 12.5 mg by mouth at bedtime as needed for sleep.    . Glucosamine-Chondroit-Vit C-Mn (GLUCOSAMINE 1500 COMPLEX PO) Take 1 tablet by mouth daily. Vegan CMO Glucosamine    . Hyaluronic Acid-Vitamin C (HYALURONIC ACID PO) Take 100 mg by mouth daily. Liquid Hyaluronic Acid 100 mg    . Iron-Folic Acid-Vit E72 (IRON FORMULA PO) Take 1 Dose by mouth daily. Iron 50 mg (1/2 before workout and 1/2 afterwards - cycle 1 wk off and on)    . Lysine POWD 1,600 mg by Does not apply route daily. L-Lysine Powder 1600 mg    . Methylsulfonylmethane (MSM) 1000 MG CAPS Take 4 capsules by mouth daily. 4000 mg (1/2 before workout and 1/2 afterwards)    . Multiple Vitamin (MULTIVITAMIN) tablet Take 1 tablet by mouth daily.    . Multiple Vitamins-Minerals (MY-VITALIFE) CAPS Take by mouth.    . Multiple Vitamins-Minerals (ZINC PO) Take 1 tablet by mouth daily. Raw Zinc 30 mg (cycle every other week)    . nivolumab (OPDIVO) 40 MG/4ML SOLN chemo injection Opdivo 40 mg/4 mL intravenous solution  INFUSE 40 MG OVER 30 MINUTE(S) BY INTRAVENOUS ROUTE EVERY 2 WEEKS    . NON FORMULARY Take 1 Dose by mouth daily. Cordyceps 750 mg    . NON FORMULARY Take 1 Dose by mouth daily. Liver Herbal Supplement 675 mg    . NON FORMULARY Take 1 Dose by mouth daily. Reishi Mushroom 500 mg    . NON FORMULARY Take 1 Dose by mouth daily. Curcumin Phytosome (Bioperrine) 3000 mg    . NON FORMULARY Take 1 tablet by mouth daily. Artemisnin 200 mg    . NON FORMULARY Take 1 tablet by mouth daily. Wellness Herbal Tablet as needed (Elderberry, Ashwagandha, etc)    . NON FORMULARY Take 1 tablet by mouth daily. Zinc L Carnosine Complex 75/59 mg (after workout)    . Omega 3-6-9 Fatty Acids (OMEGA-3-6-9 PO) Take 2 capsules by  mouth daily. Plant Based Omega 3-6-9 2 capsules = 1825 mg    . OVER THE COUNTER MEDICATION TK 5 MILLILITERS PO QID PRF MOUTH PAIN    . QUERCETIN PO Take 1 tablet by mouth daily. Quercetin 400 mg / Bromelain 85 mg    . RESVERATROL PO Take 1,200 mg by mouth daily. Trans Resveratrol    . SM Omega-3-6-9 Fatty Acids CAPS Take by mouth.    Marland Kitchen VITAMIN A PO Take 1 tablet by mouth daily. 1500 mcg (5000 IU)    . Vitamin D-Vitamin K (VITAMIN K2-VITAMIN D3 PO) Take 1 capsule by mouth daily. 5000 IU + 50 mcg    . vitamin E 180 MG (400 UNITS) capsule Take by mouth.    Marland Kitchen VITAMIN E PO Take 1 tablet by mouth daily. Vegan Vitamin E     No current facility-administered medications for this visit.    SURGICAL HISTORY:  Past Surgical History:  Procedure Laterality  Date  . APPLICATION OF CRANIAL NAVIGATION Left 04/29/2018   Procedure: APPLICATION OF CRANIAL NAVIGATION;  Surgeon: Earnie Larsson, MD;  Location: [place];  Service: Neurosurgery;  Laterality: Left;  . CRANIOTOMY Left 04/29/2018   Procedure: LEFT CRANIOTOMY FOR  TUMOR BRAIN LAB;  Surgeon: Earnie Larsson, MD;  Location: [place];  Service: Neurosurgery;  Laterality: Left;    REVIEW OF SYSTEMS:   Review of Systems  Constitutional: Negative for appetite change, chills, fatigue, fever and unexpected weight change.  HENT: Negative for mouth sores, nosebleeds, sore throat and trouble swallowing.   Eyes: Negative for eye problems and icterus.  Respiratory: Negative for cough, hemoptysis, shortness of breath and wheezing.  Cardiovascular: Negative for chest pain and leg swelling.  Gastrointestinal: Negative for abdominal pain, constipation, diarrhea, nausea and vomiting.  Genitourinary: Negative for bladder incontinence, difficulty urinating, dysuria, frequency and hematuria.   Musculoskeletal: Negative for back pain, gait problem, neck pain and neck stiffness.  Skin: Positive for occasional generalized itching without rash.  Neurological: Negative for dizziness,  extremity weakness, gait problem, headaches, light-headedness and seizures.  Hematological: Negative for adenopathy. Does not bruise/bleed easily.  Psychiatric/Behavioral: Negative for confusion, depression and sleep disturbance. The patient is not nervous/anxious.     PHYSICAL EXAMINATION:  Blood pressure 109/74, pulse 72, temperature 98.7 F (37.1 C), temperature source Temporal, resp. rate 17, height 5' 9"  (1.753 m), weight 149 lb 9.6 oz (67.9 kg), SpO2 100 %, unknown if currently breastfeeding.  ECOG PERFORMANCE STATUS: 1 - Symptomatic but completely ambulatory  Physical Exam  Constitutional: Oriented to person, place, and time and well-developed, well-nourished, and in no distress.  HENT:  Head: Normocephalic and atraumatic.  Mouth/Throat: Oropharynx is clear and moist. No oropharyngeal exudate.  Eyes: Conjunctivae are normal. Right eye exhibits no discharge. Left eye exhibits no discharge. No scleral icterus.  Neck: Normal range of motion. Neck supple.  Cardiovascular: Normal rate, regular rhythm, normal heart sounds and intact distal pulses.   Pulmonary/Chest: Effort normal and breath sounds normal. No respiratory distress. No wheezes. No rales.  Abdominal: Soft. Bowel sounds are normal. Exhibits no distension and no mass. There is no tenderness.  Musculoskeletal: Normal range of motion. Exhibits no edema.  Lymphadenopathy:    No cervical adenopathy.  Neurological: Alert and oriented to person, place, and time. Exhibits normal muscle tone. Gait normal. Coordination normal.  Skin: Small, flat, red dry appearing well circumscribed skin lesion on right forearm. Skin is warm and dry. Not diaphoretic. No erythema. No pallor.  Psychiatric: Mood, memory and judgment normal.  Vitals reviewed.  LABORATORY DATA: Lab Results  Component Value Date   WBC 3.9 (L) 02/20/2020   HGB 15.1 (H) 02/20/2020   HCT 45.3 02/20/2020   MCV 92.6 02/20/2020   PLT 229 02/20/2020      Chemistry       Component Value Date/Time   NA 140 02/20/2020 0816   K 4.3 02/20/2020 0816   CL 108 02/20/2020 0816   CO2 23 02/20/2020 0816   BUN 11 02/20/2020 0816   CREATININE 0.89 02/20/2020 0816      Component Value Date/Time   CALCIUM 8.8 (L) 02/20/2020 0816   ALKPHOS 103 02/20/2020 0816   AST 37 02/20/2020 0816   ALT 28 02/20/2020 0816   BILITOT 0.3 02/20/2020 0816       RADIOGRAPHIC STUDIES:  MR Brain W Wo Contrast  Result Date: 01/25/2020 CLINICAL DATA:  Metastatic melanoma. Left temporal metastasis resected in 04/2018 with postoperative SRS. EXAM: MRI HEAD WITHOUT  AND WITH CONTRAST TECHNIQUE: Multiplanar, multiecho pulse sequences of the brain and surrounding structures were obtained without and with intravenous contrast. CONTRAST:  59m MULTIHANCE GADOBENATE DIMEGLUMINE 529 MG/ML IV SOLN COMPARISON:  10/19/2019 FINDINGS: Brain: Sequelae of left pterional craniotomy are again identified with a resection cavity in the left temporal lobe. Confluent T2 hyperintensity/edema in the anterior left temporal lobe is unchanged. A focus of enhancement in the medial left temporal lobe is much larger than on the prior study and now measures 1.3 x 1.0 cm (series 11, image 55 and series 13, image 27). An adjacent 2 mm focus of enhancement posterior to this is stable to minimally larger (series 11, image 54). No enhancing lesions are identified elsewhere. A few small foci of T2 hyperintensity in the cerebral white matter bilaterally are unchanged and nonspecific. The ventricles are unchanged in size with minimal ex vacuo dilatation of the left temporal horn. There is no midline shift or extra-axial fluid collection. Chronic blood products in the left temporal lobe are unchanged. Vascular: Major intracranial vascular flow voids are preserved. Skull and upper cervical spine: No suspicious marrow lesion. Sinuses/Orbits: Unremarkable orbits. Paranasal sinuses and mastoid air cells are clear. Other: None. IMPRESSION:  1. Substantial enlargement of one of the enhancing foci in the medial left temporal lobe which could reflect recurrent tumor or progressive postradiation changes. Unchanged regional T2 signal abnormality/edema. 2. No evidence of intracranial metastases remote from the left temporal treatment site. Electronically Signed   By: ALogan BoresM.D.   On: 01/25/2020 16:37     ASSESSMENT/PLAN:  This is a very pleasant 44year old Caucasian female who presented with a large mass in theleftupper/lower lobe and mediastinal adenopathy as well as a solitary brain metastasis highly suspicious for malignant melanoma.[facility].  She is status post a left temporal craniotomy and resection of the tumor on 04/29/2018. The patient also completed stereotactic radiotherapy to the resection cavity under the care of Dr. MLisbeth Renshaw She previously underwent treatment with immunotherapy with Keytruda 200 mg IV every 3 weeks. She is status post 3 cycles. CT scanafter cycle #3 showed enlargement of the left upper lobe lung mass. This was suspicious for pseudo-progression on immunotherapy. The patient was started on palliative course of radiotherapy to the left upper lobe mass and she tolerated this treatment fairly well. She was seen by Dr. IGavin Pottersand she recommended [facility] the [facility]. The patient was treated with 2 more cycles of Keytruda and tolerated the treatment well. The patient then had a CT scan performed which showed pericardial invasion. Therefore,the patient is not a good candidate for surgical resection.   [facility] the patient was switched to immunotherapy withipilimumaband nivolumab. She isstatus post 2 cycles.Treatment  was on hold for more than 6 weeks secondary to grade 3 hepatic dysfunction. She had improvement of her liver enzymes after a prolonged treatment with a tapering scheduleof steroids.  She is currently undergoing single agent nivolumab 240 mg IV every 2 weeks. She is status post an additional20cycles with nivolumab240 mg IV every 2 weeks. She has beentoleratingtreatment well.    Labs were reviewed.  Recommend that she proceed with cycle #23 today as scheduled.  We will see her back for follow-up visit in 2 weeks for evaluation before starting cycle #24.  [facility] and [facility] for her skin lesion.   The patient was advised to call immediately if she has any concerning symptoms in the interval. The patient voices understanding of current disease status and treatment options and is in agreement with the current care plan. All questions were answered. The patient knows to call the clinic with any problems, questions or concerns. We can certainly see the patient much sooner if necessary   No orders of the defined types were placed in this encounter.    Deklin Bieler L Eyad Rochford, PA-C 02/20/20

## 2020-02-20 ENCOUNTER — Inpatient Hospital Stay: Payer: BC Managed Care – PPO | Attending: Physician Assistant | Admitting: Physician Assistant

## 2020-02-20 ENCOUNTER — Other Ambulatory Visit: Payer: Self-pay

## 2020-02-20 ENCOUNTER — Encounter: Payer: Self-pay | Admitting: Physician Assistant

## 2020-02-20 ENCOUNTER — Inpatient Hospital Stay: Payer: BC Managed Care – PPO

## 2020-02-20 VITALS — BP 109/74 | HR 72 | Temp 98.7°F | Resp 17 | Ht 69.0 in | Wt 149.6 lb

## 2020-02-20 DIAGNOSIS — Z923 Personal history of irradiation: Secondary | ICD-10-CM | POA: Insufficient documentation

## 2020-02-20 DIAGNOSIS — C7802 Secondary malignant neoplasm of left lung: Secondary | ICD-10-CM | POA: Diagnosis not present

## 2020-02-20 DIAGNOSIS — C7931 Secondary malignant neoplasm of brain: Secondary | ICD-10-CM | POA: Insufficient documentation

## 2020-02-20 DIAGNOSIS — Z5112 Encounter for antineoplastic immunotherapy: Secondary | ICD-10-CM | POA: Insufficient documentation

## 2020-02-20 DIAGNOSIS — Z79899 Other long term (current) drug therapy: Secondary | ICD-10-CM | POA: Insufficient documentation

## 2020-02-20 DIAGNOSIS — C439 Malignant melanoma of skin, unspecified: Secondary | ICD-10-CM | POA: Diagnosis not present

## 2020-02-20 DIAGNOSIS — Z9221 Personal history of antineoplastic chemotherapy: Secondary | ICD-10-CM | POA: Insufficient documentation

## 2020-02-20 LAB — CBC WITH DIFFERENTIAL (CANCER CENTER ONLY)
Abs Immature Granulocytes: 0.01 10*3/uL (ref 0.00–0.07)
Basophils Absolute: 0 10*3/uL (ref 0.0–0.1)
Basophils Relative: 1 %
Eosinophils Absolute: 0.1 10*3/uL (ref 0.0–0.5)
Eosinophils Relative: 2 %
HCT: 45.3 % (ref 36.0–46.0)
Hemoglobin: 15.1 g/dL — ABNORMAL HIGH (ref 12.0–15.0)
Immature Granulocytes: 0 %
Lymphocytes Relative: 22 %
Lymphs Abs: 0.9 10*3/uL (ref 0.7–4.0)
MCH: 30.9 pg (ref 26.0–34.0)
MCHC: 33.3 g/dL (ref 30.0–36.0)
MCV: 92.6 fL (ref 80.0–100.0)
Monocytes Absolute: 0.7 10*3/uL (ref 0.1–1.0)
Monocytes Relative: 18 %
Neutro Abs: 2.2 10*3/uL (ref 1.7–7.7)
Neutrophils Relative %: 57 %
Platelet Count: 229 10*3/uL (ref 150–400)
RBC: 4.89 MIL/uL (ref 3.87–5.11)
RDW: 12.8 % (ref 11.5–15.5)
WBC Count: 3.9 10*3/uL — ABNORMAL LOW (ref 4.0–10.5)
nRBC: 0 % (ref 0.0–0.2)

## 2020-02-20 LAB — CMP (CANCER CENTER ONLY)
ALT: 28 U/L (ref 0–44)
AST: 37 U/L (ref 15–41)
Albumin: 4.1 g/dL (ref 3.5–5.0)
Alkaline Phosphatase: 103 U/L (ref 38–126)
Anion gap: 9 (ref 5–15)
BUN: 11 mg/dL (ref 6–20)
CO2: 23 mmol/L (ref 22–32)
Calcium: 8.8 mg/dL — ABNORMAL LOW (ref 8.9–10.3)
Chloride: 108 mmol/L (ref 98–111)
Creatinine: 0.89 mg/dL (ref 0.44–1.00)
GFR, Est AFR Am: >60 mL/min (ref >60)
GFR, Estimated: >60 mL/min (ref >60)
Glucose, Bld: 75 mg/dL (ref 70–99)
Potassium: 4.3 mmol/L (ref 3.5–5.1)
Sodium: 140 mmol/L (ref 135–145)
Total Bilirubin: 0.3 mg/dL (ref 0.3–1.2)
Total Protein: 6.7 g/dL (ref 6.5–8.1)

## 2020-02-20 MED ORDER — SODIUM CHLORIDE 0.9 % IV SOLN
Freq: Once | INTRAVENOUS | Status: AC
  Filled 2020-02-20: qty 250

## 2020-02-20 MED ORDER — SODIUM CHLORIDE 0.9 % IV SOLN
240.0000 mg | Freq: Once | INTRAVENOUS | Status: AC
  Administered 2020-02-20: 240 mg via INTRAVENOUS
  Filled 2020-02-20: qty 24

## 2020-02-20 NOTE — Patient Instructions (Signed)
[place] Discharge Instructions for Patients Receiving Chemotherapy  Today you received the following chemotherapy agents: Nivolumab  To help prevent nausea and vomiting after your treatment, we encourage you to take your nausea medication as directed.    If you develop nausea and vomiting that is not controlled by your nausea medication, call the clinic.   BELOW ARE SYMPTOMS THAT SHOULD BE REPORTED IMMEDIATELY:  *FEVER GREATER THAN 100.5 F  *CHILLS WITH OR WITHOUT FEVER  NAUSEA AND VOMITING THAT IS NOT CONTROLLED WITH YOUR NAUSEA MEDICATION  *UNUSUAL SHORTNESS OF BREATH  *UNUSUAL BRUISING OR BLEEDING  TENDERNESS IN MOUTH AND THROAT WITH OR WITHOUT PRESENCE OF ULCERS  *URINARY PROBLEMS  *BOWEL PROBLEMS  UNUSUAL RASH Items with * indicate a potential emergency and should be followed up as soon as possible.  Feel free to call the clinic should you have any questions or concerns. The clinic phone number is [phone].  Please show the [place] at check-in to the Emergency Department and triage nurse.

## 2020-02-21 ENCOUNTER — Telehealth: Payer: Self-pay | Admitting: Physician Assistant

## 2020-02-21 NOTE — Telephone Encounter (Signed)
Add more appts per los. Called and left msg. Mailed printout

## 2020-02-22 ENCOUNTER — Other Ambulatory Visit: Payer: Self-pay | Admitting: *Deleted

## 2020-02-22 DIAGNOSIS — D2230 Melanocytic nevi of unspecified part of face: Secondary | ICD-10-CM | POA: Diagnosis not present

## 2020-02-22 DIAGNOSIS — D485 Neoplasm of uncertain behavior of skin: Secondary | ICD-10-CM | POA: Diagnosis not present

## 2020-02-22 DIAGNOSIS — L570 Actinic keratosis: Secondary | ICD-10-CM | POA: Diagnosis not present

## 2020-03-05 ENCOUNTER — Telehealth: Payer: Self-pay | Admitting: Medical Oncology

## 2020-03-05 ENCOUNTER — Inpatient Hospital Stay (HOSPITAL_BASED_OUTPATIENT_CLINIC_OR_DEPARTMENT_OTHER): Payer: BC Managed Care – PPO | Admitting: Internal Medicine

## 2020-03-05 ENCOUNTER — Other Ambulatory Visit: Payer: Self-pay

## 2020-03-05 ENCOUNTER — Inpatient Hospital Stay: Payer: BC Managed Care – PPO

## 2020-03-05 ENCOUNTER — Encounter: Payer: Self-pay | Admitting: Internal Medicine

## 2020-03-05 VITALS — BP 113/83 | HR 58 | Temp 98.7°F | Resp 18 | Ht 69.0 in | Wt 148.6 lb

## 2020-03-05 DIAGNOSIS — C7802 Secondary malignant neoplasm of left lung: Secondary | ICD-10-CM

## 2020-03-05 DIAGNOSIS — Z923 Personal history of irradiation: Secondary | ICD-10-CM | POA: Diagnosis not present

## 2020-03-05 DIAGNOSIS — C439 Malignant melanoma of skin, unspecified: Secondary | ICD-10-CM

## 2020-03-05 DIAGNOSIS — Z5112 Encounter for antineoplastic immunotherapy: Secondary | ICD-10-CM | POA: Diagnosis not present

## 2020-03-05 DIAGNOSIS — C3432 Malignant neoplasm of lower lobe, left bronchus or lung: Secondary | ICD-10-CM

## 2020-03-05 DIAGNOSIS — Z9221 Personal history of antineoplastic chemotherapy: Secondary | ICD-10-CM | POA: Diagnosis not present

## 2020-03-05 DIAGNOSIS — Z79899 Other long term (current) drug therapy: Secondary | ICD-10-CM | POA: Diagnosis not present

## 2020-03-05 DIAGNOSIS — C7931 Secondary malignant neoplasm of brain: Secondary | ICD-10-CM | POA: Diagnosis not present

## 2020-03-05 LAB — CBC WITH DIFFERENTIAL (CANCER CENTER ONLY)
Abs Immature Granulocytes: 0 10*3/uL (ref 0.00–0.07)
Basophils Absolute: 0 10*3/uL (ref 0.0–0.1)
Basophils Relative: 1 %
Eosinophils Absolute: 0.1 10*3/uL (ref 0.0–0.5)
Eosinophils Relative: 2 %
HCT: 41.6 % (ref 36.0–46.0)
Hemoglobin: 13.7 g/dL (ref 12.0–15.0)
Immature Granulocytes: 0 %
Lymphocytes Relative: 21 %
Lymphs Abs: 0.8 10*3/uL (ref 0.7–4.0)
MCH: 30.5 pg (ref 26.0–34.0)
MCHC: 32.9 g/dL (ref 30.0–36.0)
MCV: 92.7 fL (ref 80.0–100.0)
Monocytes Absolute: 0.5 10*3/uL (ref 0.1–1.0)
Monocytes Relative: 13 %
Neutro Abs: 2.3 10*3/uL (ref 1.7–7.7)
Neutrophils Relative %: 63 %
Platelet Count: 257 10*3/uL (ref 150–400)
RBC: 4.49 MIL/uL (ref 3.87–5.11)
RDW: 12.8 % (ref 11.5–15.5)
WBC Count: 3.7 10*3/uL — ABNORMAL LOW (ref 4.0–10.5)
nRBC: 0 % (ref 0.0–0.2)

## 2020-03-05 LAB — CMP (CANCER CENTER ONLY)
ALT: 35 U/L (ref 0–44)
AST: 50 U/L — ABNORMAL HIGH (ref 15–41)
Albumin: 4.1 g/dL (ref 3.5–5.0)
Alkaline Phosphatase: 98 U/L (ref 38–126)
Anion gap: 10 (ref 5–15)
BUN: 11 mg/dL (ref 6–20)
CO2: 25 mmol/L (ref 22–32)
Calcium: 9.1 mg/dL (ref 8.9–10.3)
Chloride: 105 mmol/L (ref 98–111)
Creatinine: 0.91 mg/dL (ref 0.44–1.00)
GFR, Est AFR Am: >60 mL/min (ref >60)
GFR, Estimated: >60 mL/min (ref >60)
Glucose, Bld: 82 mg/dL (ref 70–99)
Potassium: 4.2 mmol/L (ref 3.5–5.1)
Sodium: 140 mmol/L (ref 135–145)
Total Bilirubin: 0.3 mg/dL (ref 0.3–1.2)
Total Protein: 6.7 g/dL (ref 6.5–8.1)

## 2020-03-05 LAB — TSH: TSH: 2.524 u[IU]/mL (ref 0.308–3.960)

## 2020-03-05 MED ORDER — SODIUM CHLORIDE 0.9 % IV SOLN
240.0000 mg | Freq: Once | INTRAVENOUS | Status: AC
  Administered 2020-03-05: 240 mg via INTRAVENOUS
  Filled 2020-03-05: qty 24

## 2020-03-05 MED ORDER — SODIUM CHLORIDE 0.9 % IV SOLN
Freq: Once | INTRAVENOUS | Status: AC
  Filled 2020-03-05: qty 250

## 2020-03-05 NOTE — Progress Notes (Signed)
[place] [phone]   [phone]  OFFICE PROGRESS NOTE  System, Pcp Not In No address on file  DIAGNOSIS: Metastatic high-grade neoplasm consistent with metastatic malignant melanoma based on the most recent pathology report from [facility] in October 2019, presented with large left left upper/left lower lobe mass with a hypermetabolic AP window lymph node as well as solitary metastatic brain lesion diagnosed in May 2019.  Biomarker Findings Microsatellite status - MS-Stable Tumor Mutational Burden - TMB-Intermediate (16 Muts/Mb) Genomic Findings For a complete list of the genes assayed, please refer to the Appendix. CD274 (PD-L1) amplification NRAS Q61R PDCD1LG2 (PD-L2) amplification MYC amplification EPHB1 amplification JAK2 amplification RB1 Q93*, C376* TERT promoter -146C>T TP53 C275W   PRIOR THERAPY: 1) left temporal craniotomy with resection of tumor with intraoperative stereotactic guidance for volumetric resection under the care of Dr. Annette Stable on 04/29/2018. 2) First-line treatment with immunotherapy with Keytruda 200 mg IV every 3 weeks.  First dose June 02, 2018.  Status post 3 cycles.  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 3) palliative radiotherapy to the left lung mass under the care of Dr. Lisbeth Renshaw completed on 08/24/2018. 4) Resuming her treatment again with Keytruda 200 mg IV every 3 weeks, first dose 09/08/2018.  Status post 2 cycles.  CURRENT THERAPY: Treatment with immunotherapy with ipilimumab 3 mg/KG and nivolumab 1 mg/KG every 3 weeks for the first 4 cycles followed by maintenance nivolumab 240 mg IV every 2 weeks.  First dose of this treatment October 26, 2018.  Status post 23 cycles.  Ipilimumab was discontinued after cycle #2 for the significant liver dysfunction.  Starting from cycle #3 the patient is on treatment with single agent nivolumab.  INTERVAL  HISTORY: Leslie [name] 44 y.o. female returns to the clinic today for follow-up visit.  The patient is feeling fine today with no concerning complaints.  She denied having any fatigue or weakness.  She denied having any nausea, vomiting, diarrhea or constipation.  She has no chest pain, shortness of breath, cough or hemoptysis.  She denied having any recent weight loss or night sweats.  The patient has no headache or visual changes.  Her insurance trying to deny the continuation of her treatment because of suspicious disease progression in the brain which is likely tumor necrosis.  We are working with them on getting the approval for continuation of her treatment.  She is supposed to start cycle #24 today.  MEDICAL HISTORY: Past Medical History:  Diagnosis Date  . 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 Migraines   . Yeast infection     ALLERGIES:  is allergic to diflucan [fluconazole].  MEDICATIONS:  Current Outpatient Medications  Medication Sig Dispense Refill  . AMERICAN GINSENG PO Take 1,500-3,000 mg by mouth daily. Depending on level of fatigue    . Amino Acids (L-CARNITINE) LIQD Take 2,000 mg by mouth daily. Liquid L-Carnitine 2000 mg    . Ascorbic Acid (LIQUID C 500) 500 MG/15ML LIQD Take 1,000 mg by mouth daily.    . Beta Glucan POWD 1 Dose by Does not apply route daily. Beta D Glucan 400 mg    . Calcium-Magnesium (CAL-MAG PO) Take 15 mLs by mouth daily. Marine Based Cal - Mag (500 mg Calcium / 250 mg Mag) 15 ml total    . Coenzyme Q10 (COQ-10) 100 MG CAPS Take 1 tablet by mouth daily.    Marland Kitchen CREATINE MONOHYDRATE  PO Take 6 g by mouth daily. Creatine MonoHydrate 6 g (on training cycle)    . doxylamine, Sleep, (UNISOM) 25 MG tablet Take 12.5 mg by mouth at bedtime as needed for sleep.    . Glucosamine-Chondroit-Vit C-Mn (GLUCOSAMINE 1500 COMPLEX PO) Take 1 tablet by mouth daily. Vegan CMO Glucosamine    . Hyaluronic Acid-Vitamin C (HYALURONIC ACID PO) Take  100 mg by mouth daily. Liquid Hyaluronic Acid 100 mg    . Iron-Folic Acid-Vit Q46 (IRON FORMULA PO) Take 1 Dose by mouth daily. Iron 50 mg (1/2 before workout and 1/2 afterwards - cycle 1 wk off and on)    . Lysine POWD 1,600 mg by Does not apply route daily. L-Lysine Powder 1600 mg    . Methylsulfonylmethane (MSM) 1000 MG CAPS Take 4 capsules by mouth daily. 4000 mg (1/2 before workout and 1/2 afterwards)    . Multiple Vitamin (MULTIVITAMIN) tablet Take 1 tablet by mouth daily.    . Multiple Vitamins-Minerals (MY-VITALIFE) CAPS Take by mouth.    . Multiple Vitamins-Minerals (ZINC PO) Take 1 tablet by mouth daily. Raw Zinc 30 mg (cycle every other week)    . nivolumab (OPDIVO) 40 MG/4ML SOLN chemo injection Opdivo 40 mg/4 mL intravenous solution  INFUSE 40 MG OVER 30 MINUTE(S) BY INTRAVENOUS ROUTE EVERY 2 WEEKS    . NON FORMULARY Take 1 Dose by mouth daily. Cordyceps 750 mg    . NON FORMULARY Take 1 Dose by mouth daily. Liver Herbal Supplement 675 mg    . NON FORMULARY Take 1 Dose by mouth daily. Reishi Mushroom 500 mg    . NON FORMULARY Take 1 Dose by mouth daily. Curcumin Phytosome (Bioperrine) 3000 mg    . NON FORMULARY Take 1 tablet by mouth daily. Artemisnin 200 mg    . NON FORMULARY Take 1 tablet by mouth daily. Wellness Herbal Tablet as needed (Elderberry, Ashwagandha, etc)    . NON FORMULARY Take 1 tablet by mouth daily. Zinc L Carnosine Complex 75/59 mg (after workout)    . Omega 3-6-9 Fatty Acids (OMEGA-3-6-9 PO) Take 2 capsules by mouth daily. Plant Based Omega 3-6-9 2 capsules = 1825 mg    . OVER THE COUNTER MEDICATION TK 5 MILLILITERS PO QID PRF MOUTH PAIN    . QUERCETIN PO Take 1 tablet by mouth daily. Quercetin 400 mg / Bromelain 85 mg    . RESVERATROL PO Take 1,200 mg by mouth daily. Trans Resveratrol    . SM Omega-3-6-9 Fatty Acids CAPS Take by mouth.    Marland Kitchen VITAMIN A PO Take 1 tablet by mouth daily. 1500 mcg (5000 IU)    . Vitamin D-Vitamin K (VITAMIN K2-VITAMIN D3 PO) Take 1  capsule by mouth daily. 5000 IU + 50 mcg    . vitamin E 180 MG (400 UNITS) capsule Take by mouth.    Marland Kitchen VITAMIN E PO Take 1 tablet by mouth daily. Vegan Vitamin E     No current facility-administered medications for this visit.    SURGICAL HISTORY:  Past Surgical History:  Procedure Laterality Date  . APPLICATION OF CRANIAL NAVIGATION Left 04/29/2018   Procedure: APPLICATION OF CRANIAL NAVIGATION;  Surgeon: Earnie Larsson, MD;  Location: [place];  Service: Neurosurgery;  Laterality: Left;  . CRANIOTOMY Left 04/29/2018   Procedure: LEFT CRANIOTOMY FOR  TUMOR BRAIN LAB;  Surgeon: Earnie Larsson, MD;  Location: [place];  Service: Neurosurgery;  Laterality: Left;    REVIEW OF SYSTEMS:  A comprehensive review of systems was negative.  PHYSICAL EXAMINATION: General appearance: alert, cooperative and no distress Head: Normocephalic, without obvious abnormality, atraumatic Neck: no adenopathy, no JVD, supple, symmetrical, trachea midline and thyroid not enlarged, symmetric, no tenderness/mass/nodules Lymph nodes: Cervical, supraclavicular, and axillary nodes normal. Resp: clear to auscultation bilaterally Back: negative, symmetric, no curvature. ROM normal. No CVA tenderness. Cardio: regular rate and rhythm, S1, S2 normal, no murmur, click, rub or gallop GI: soft, non-tender; bowel sounds normal; no masses,  no organomegaly Extremities: extremities normal, atraumatic, no cyanosis or edema  ECOG PERFORMANCE STATUS: 0 - Asymptomatic  Blood pressure 113/83, pulse (!) 58, temperature 98.7 F (37.1 C), temperature source Temporal, resp. rate 18, height 5' 9"  (1.753 m), weight 148 lb 9.6 oz (67.4 kg), SpO2 100 %, unknown if currently breastfeeding.  LABORATORY DATA: Lab Results  Component Value Date   WBC 3.9 (L) 02/20/2020   HGB 15.1 (H) 02/20/2020   HCT 45.3 02/20/2020   MCV 92.6 02/20/2020   PLT 229 02/20/2020      Chemistry      Component Value Date/Time   NA 140 02/20/2020 0816   K 4.3  02/20/2020 0816   CL 108 02/20/2020 0816   CO2 23 02/20/2020 0816   BUN 11 02/20/2020 0816   CREATININE 0.89 02/20/2020 0816      Component Value Date/Time   CALCIUM 8.8 (L) 02/20/2020 0816   ALKPHOS 103 02/20/2020 0816   AST 37 02/20/2020 0816   ALT 28 02/20/2020 0816   BILITOT 0.3 02/20/2020 0816       RADIOGRAPHIC STUDIES: No results found.  ASSESSMENT AND PLAN: This is a very pleasant 44 years old white female with highly suspicious metastatic malignant melanoma presented with large mass in the left upper/left lower lobe and mediastinal lymphadenopathy as well as solitary brain metastasis status post left temporal craniotomy and resection of tumor on 04/29/2018 and she is recovering well from her surgery. The patient completed stereotactic radiotherapy to the resection cavity next week under the care of Dr. Lisbeth Renshaw. The patient underwent treatment with immunotherapy with Keytruda 200 mg IV every 3 weeks status post 3 cycles.   Her scan after cycle #3 showed enlargement of the left upper lobe lung mass.  This was also suspicious for pseudo-progression on immunotherapy.  The patient was started on a palliative course of radiotherapy to the left upper lobe lung mass and she tolerated this treatment fairly well.  She was seen by Dr. Emelda Brothers at [facility] and she recommended for the patient to resume her treatment with [facility] for now until she undergoes further molecular studies.  The patient was treated with 2 more cycles of Keytruda and tolerated the treatment well. Her recent CT scan of the chest, abdomen and pelvis showed some improvement in the left upper lobe lung mass but there is still concern about pericardial invasion. I personally and independently reviewed the scan images and discussed the result and showed the images to the patient and her boyfriend today.  She is still not a good candidate for surgical resection because of the pericardial invasion. The final pathology  report and recommendation from [facility] was consistent with metastatic melanoma and the recommendation is to switch the patient to a combination immunotherapy with Ipilumumab and nivolumab. The patient underwent treatment with immunotherapy with ipilimumab and nivolumab status post 2 cycles.  Her treatment was on hold for more than 6 weeks secondary to grade 3 hepatic dysfunction.  She had improvement of her liver enzyme after a prolonged treatment with a  steroid with a tapering schedule. She resumed her treatment again with single agent nivolumab.  She is status post 23 cycles of treatment. The patient has been tolerating this treatment well with no concerning adverse effects. We will try to get the approval from her insurance to continue with her treatment as planned. The patient will come back for follow-up visit in 2 weeks before the next cycle of her treatment. She was advised to call immediately if she has any concerning symptoms in the interval. The patient voices understanding of current disease status and treatment options and is in agreement with the current care plan. All questions were answered. The patient knows to call the clinic with any problems, questions or concerns. We can certainly see the patient much sooner if necessary. The total time spent in the appointment was 30 minutes. Disclaimer: This note was dictated with voice recognition software. Similar sounding words can inadvertently be transcribed and may not be corrected upon review.

## 2020-03-05 NOTE — Telephone Encounter (Signed)
appt today at 3 pm confirmed with pt.

## 2020-03-05 NOTE — Patient Instructions (Signed)
[facility] Discharge Instructions for Patients Receiving Chemotherapy  Today you received the following chemotherapy agents: nivolumab.  To help prevent nausea and vomiting after your treatment, we encourage you to take your nausea medication as directed.   If you develop nausea and vomiting that is not controlled by your nausea medication, call the clinic.   BELOW ARE SYMPTOMS THAT SHOULD BE REPORTED IMMEDIATELY:  *FEVER GREATER THAN 100.5 F  *CHILLS WITH OR WITHOUT FEVER  NAUSEA AND VOMITING THAT IS NOT CONTROLLED WITH YOUR NAUSEA MEDICATION  *UNUSUAL SHORTNESS OF BREATH  *UNUSUAL BRUISING OR BLEEDING  TENDERNESS IN MOUTH AND THROAT WITH OR WITHOUT PRESENCE OF ULCERS  *URINARY PROBLEMS  *BOWEL PROBLEMS  UNUSUAL RASH Items with * indicate a potential emergency and should be followed up as soon as possible.  Feel free to call the clinic should you have any questions or concerns. The clinic phone number is [phone].  Please show the [facility] at check-in to the Emergency Department and triage nurse.

## 2020-03-06 DIAGNOSIS — D485 Neoplasm of uncertain behavior of skin: Secondary | ICD-10-CM | POA: Diagnosis not present

## 2020-03-06 DIAGNOSIS — L918 Other hypertrophic disorders of the skin: Secondary | ICD-10-CM | POA: Diagnosis not present

## 2020-03-06 DIAGNOSIS — D225 Melanocytic nevi of trunk: Secondary | ICD-10-CM | POA: Diagnosis not present

## 2020-03-06 DIAGNOSIS — D2230 Melanocytic nevi of unspecified part of face: Secondary | ICD-10-CM | POA: Diagnosis not present

## 2020-03-06 DIAGNOSIS — L821 Other seborrheic keratosis: Secondary | ICD-10-CM | POA: Diagnosis not present

## 2020-03-08 ENCOUNTER — Other Ambulatory Visit: Payer: Self-pay | Admitting: Radiation Oncology

## 2020-03-08 ENCOUNTER — Ambulatory Visit
Admission: RE | Admit: 2020-03-08 | Discharge: 2020-03-08 | Disposition: A | Discharge status: Home / Self Care | Payer: Self-pay | Source: Ambulatory Visit | Attending: Radiation Oncology | Admitting: Radiation Oncology

## 2020-03-08 DIAGNOSIS — Z923 Personal history of irradiation: Secondary | ICD-10-CM | POA: Diagnosis not present

## 2020-03-08 DIAGNOSIS — C799 Secondary malignant neoplasm of unspecified site: Secondary | ICD-10-CM | POA: Diagnosis not present

## 2020-03-08 DIAGNOSIS — C7931 Secondary malignant neoplasm of brain: Secondary | ICD-10-CM | POA: Diagnosis not present

## 2020-03-08 DIAGNOSIS — C439 Malignant melanoma of skin, unspecified: Secondary | ICD-10-CM | POA: Diagnosis not present

## 2020-03-08 DIAGNOSIS — Z6821 Body mass index (BMI) 21.0-21.9, adult: Secondary | ICD-10-CM | POA: Diagnosis not present

## 2020-03-08 DIAGNOSIS — C7989 Secondary malignant neoplasm of other specified sites: Secondary | ICD-10-CM | POA: Diagnosis not present

## 2020-03-08 DIAGNOSIS — Z9889 Other specified postprocedural states: Secondary | ICD-10-CM | POA: Diagnosis not present

## 2020-03-11 ENCOUNTER — Other Ambulatory Visit: Payer: BC Managed Care – PPO

## 2020-03-11 DIAGNOSIS — S29012A Strain of muscle and tendon of back wall of thorax, initial encounter: Secondary | ICD-10-CM | POA: Diagnosis not present

## 2020-03-11 DIAGNOSIS — M9907 Segmental and somatic dysfunction of upper extremity: Secondary | ICD-10-CM | POA: Diagnosis not present

## 2020-03-11 DIAGNOSIS — M9901 Segmental and somatic dysfunction of cervical region: Secondary | ICD-10-CM | POA: Diagnosis not present

## 2020-03-11 DIAGNOSIS — M2560 Stiffness of unspecified joint, not elsewhere classified: Secondary | ICD-10-CM | POA: Diagnosis not present

## 2020-03-13 ENCOUNTER — Encounter: Payer: Self-pay | Admitting: Medical Oncology

## 2020-03-13 ENCOUNTER — Inpatient Hospital Stay: Payer: BC Managed Care – PPO

## 2020-03-13 NOTE — Progress Notes (Signed)
Pharmacist Chemotherapy Monitoring - Follow Up Assessment    I verify that I have reviewed each item in the below checklist:  . Regimen for the patient is scheduled for the appropriate day and plan matches scheduled date. Marland Kitchen Appropriate non-routine labs are ordered dependent on drug ordered. . If applicable, additional medications reviewed and ordered per protocol based on lifetime cumulative doses and/or treatment regimen.   Plan for follow-up and/or issues identified: No . I-vent associated with next due treatment: No . MD and/or nursing notified: No  Britt Boozer 03/13/2020 11:35 AM

## 2020-03-14 ENCOUNTER — Other Ambulatory Visit: Payer: Self-pay

## 2020-03-14 ENCOUNTER — Inpatient Hospital Stay: Payer: BC Managed Care – PPO | Attending: Physician Assistant | Admitting: Internal Medicine

## 2020-03-14 VITALS — BP 114/83 | HR 68 | Temp 98.2°F | Resp 18 | Ht 69.0 in | Wt 146.4 lb

## 2020-03-14 DIAGNOSIS — C439 Malignant melanoma of skin, unspecified: Secondary | ICD-10-CM | POA: Diagnosis not present

## 2020-03-14 DIAGNOSIS — Z923 Personal history of irradiation: Secondary | ICD-10-CM | POA: Diagnosis not present

## 2020-03-14 DIAGNOSIS — C7931 Secondary malignant neoplasm of brain: Secondary | ICD-10-CM | POA: Diagnosis not present

## 2020-03-14 DIAGNOSIS — C7802 Secondary malignant neoplasm of left lung: Secondary | ICD-10-CM | POA: Insufficient documentation

## 2020-03-14 DIAGNOSIS — Z5112 Encounter for antineoplastic immunotherapy: Secondary | ICD-10-CM | POA: Insufficient documentation

## 2020-03-14 DIAGNOSIS — Z9221 Personal history of antineoplastic chemotherapy: Secondary | ICD-10-CM | POA: Diagnosis not present

## 2020-03-14 NOTE — Progress Notes (Signed)
[facility] [place], [place] [address] [phone]   Interval Evaluation  Date of Service: 03/14/20 Patient Name: Leslie [name] Patient MRN: [id number] Patient DOB: [birth year] Provider: Ventura Sellers, MD  Identifying Statement:  Leslie [name] is a 44 y.o. female with Brain metastasis ([place]) [C79.31]    Primary Cancer:  Oncologic History: Oncology History  Brain metastasis ([place])  05/02/2018 Initial Diagnosis   Brain metastasis ([place])   06/02/2018 - 08/03/2018 Chemotherapy   The patient had pembrolizumab (KEYTRUDA) 200 mg in sodium chloride 0.9 % 50 mL chemo infusion, 200 mg, Intravenous, Once, 3 of 6 cycles Administration: 200 mg (06/02/2018), 200 mg (06/23/2018), 200 mg (07/14/2018)  for chemotherapy treatment.    09/08/2018 - 10/19/2018 Chemotherapy   The patient had pembrolizumab (KEYTRUDA) 200 mg in sodium chloride 0.9 % 50 mL chemo infusion, 200 mg (100 % of original dose 200 mg), Intravenous, Once, 2 of 6 cycles Dose modification: 200 mg (original dose 200 mg, Cycle 1, Reason: Provider Judgment) Administration: 200 mg (09/08/2018), 200 mg (09/29/2018)  for chemotherapy treatment.    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    09/08/2018 - 10/19/2018 Chemotherapy   The patient had pembrolizumab (KEYTRUDA) 200 mg in sodium chloride 0.9 % 50 mL chemo infusion, 200 mg (100 % of original dose 200 mg), Intravenous, Once, 2 of 6 cycles Dose modification: 200 mg (original dose 200 mg, Cycle 1, Reason: Provider Judgment) Administration: 200 mg (09/08/2018), 200 mg (09/29/2018)  for  chemotherapy treatment.    Metastatic melanoma to lung ([place])  10/20/2018 Initial Diagnosis   Metastatic melanoma to lung ([place])   10/26/2018 -  Chemotherapy   The patient had ipilimumab (YERVOY) 200 mg in sodium chloride 0.9 % 100 mL chemo infusion, 3 mg/kg = 200 mg, Intravenous,  Once, 2 of 2 cycles Administration: 200 mg (10/26/2018), 200 mg (11/17/2018) nivolumab (OPDIVO) 67 mg in sodium chloride 0.9 % 50 mL chemo infusion, 1 mg/kg = 67 mg, Intravenous, Once, 24 of 34 cycles Administration: 67 mg (10/26/2018), 240 mg (02/08/2019), 67 mg (11/17/2018), 240 mg (03/01/2019), 240 mg (03/15/2019), 240 mg (03/29/2019), 240 mg (04/24/2019), 240 mg (04/11/2019), 240 mg (05/23/2019), 240 mg (06/06/2019), 240 mg (05/10/2019), 240 mg (06/19/2019), 240 mg (07/04/2019), 240 mg (07/18/2019), 240 mg (08/01/2019), 240 mg (08/15/2019), 240 mg (08/29/2019), 240 mg (09/12/2019), 240 mg (09/26/2019), 240 mg (10/10/2019), 240 mg (10/24/2019), 240 mg (02/06/2020), 240 mg (02/20/2020), 240 mg (03/05/2020)  for chemotherapy treatment.     [facility] Oncologic History 04/29/18: Craniotomy, resection left temporal metastasis 06/01/18: Post-op SRS // 27 Gy in 3 fractions  Interval History:  Leslie [name] presents for follow up after recent MRI brain.  She describes stability or improvement of fatigue discussed prior.  Anxiety has also improved Otherwise denies headaches, seizures.  Continues to work and care for her teenage children, currently in the middle of a move.  Medications: Current Outpatient Medications on File Prior to Visit  Medication Sig Dispense Refill  . AMERICAN GINSENG PO Take 1,500-3,000 mg by mouth daily. Depending on level of fatigue    . Amino Acids (L-CARNITINE) LIQD Take 2,000 mg by mouth daily.  Liquid L-Carnitine 2000 mg    . Ascorbic Acid (LIQUID C 500) 500 MG/15ML LIQD Take 1,000 mg by mouth daily.    . Beta Glucan POWD 1 Dose by Does not apply route daily. Beta D Glucan 400 mg    . Calcium-Magnesium (CAL-MAG PO)  Take 15 mLs by mouth daily. Marine Based Cal - Mag (500 mg Calcium / 250 mg Mag) 15 ml total    . Coenzyme Q10 (COQ-10) 100 MG CAPS Take 1 tablet by mouth daily.    Marland Kitchen CREATINE MONOHYDRATE PO Take 6 g by mouth daily. Creatine MonoHydrate 6 g (on training cycle)    . doxylamine, Sleep, (UNISOM) 25 MG tablet Take 12.5 mg by mouth at bedtime as needed for sleep.    . ferrous sulfate 324 MG TBEC Take 324 mg by mouth 2 (two) times daily.    . Glucosamine-Chondroit-Vit C-Mn (GLUCOSAMINE 1500 COMPLEX PO) Take 1 tablet by mouth daily. Vegan CMO Glucosamine    . Hyaluronic Acid-Vitamin C (HYALURONIC ACID PO) Take 100 mg by mouth daily. Liquid Hyaluronic Acid 100 mg    . Iron-Folic Acid-Vit H37 (IRON FORMULA PO) Take 1 Dose by mouth daily. Iron 50 mg (1/2 before workout and 1/2 afterwards - cycle 1 wk off and on)    . Lysine POWD 1,600 mg by Does not apply route daily. L-Lysine Powder 1600 mg    . Methylsulfonylmethane (MSM) 1000 MG CAPS Take 4 capsules by mouth daily. 4000 mg (1/2 before workout and 1/2 afterwards)    . Multiple Vitamin (MULTIVITAMIN) tablet Take 1 tablet by mouth daily.    . Multiple Vitamins-Minerals (MY-VITALIFE) CAPS Take by mouth.    . Multiple Vitamins-Minerals (ZINC PO) Take 1 tablet by mouth daily. Raw Zinc 30 mg (cycle every other week)    . nivolumab (OPDIVO) 40 MG/4ML SOLN chemo injection Opdivo 40 mg/4 mL intravenous solution  INFUSE 40 MG OVER 30 MINUTE(S) BY INTRAVENOUS ROUTE EVERY 2 WEEKS    . NON FORMULARY Take 1 Dose by mouth daily. Cordyceps 750 mg    . NON FORMULARY Take 1 Dose by mouth daily. Liver Herbal Supplement 675 mg    . NON FORMULARY Take 1 Dose by mouth daily. Reishi Mushroom 500 mg    . NON FORMULARY Take 1 Dose by mouth daily. Curcumin Phytosome (Bioperrine) 3000 mg    . NON FORMULARY Take 1 tablet by mouth daily. Artemisnin 200 mg    . NON FORMULARY Take 1 tablet by mouth daily. Wellness Herbal Tablet as needed (Elderberry, Ashwagandha, etc)    . NON  FORMULARY Take 1 tablet by mouth daily. Zinc L Carnosine Complex 75/59 mg (after workout)    . Omega 3-6-9 Fatty Acids (OMEGA-3-6-9 PO) Take 2 capsules by mouth daily. Plant Based Omega 3-6-9 2 capsules = 1825 mg    . OVER THE COUNTER MEDICATION TK 5 MILLILITERS PO QID PRF MOUTH PAIN    . QUERCETIN PO Take 1 tablet by mouth daily. Quercetin 400 mg / Bromelain 85 mg    . RESVERATROL PO Take 1,200 mg by mouth daily. Trans Resveratrol    . SM Omega-3-6-9 Fatty Acids CAPS Take by mouth.    Marland Kitchen VITAMIN A PO Take 1 tablet by mouth daily. 1500 mcg (5000 IU)    . Vitamin D-Vitamin K (VITAMIN K2-VITAMIN D3 PO) Take 1 capsule by mouth daily. 5000 IU + 50 mcg    . vitamin E 180 MG (400 UNITS) capsule Take by mouth.    Marland Kitchen  VITAMIN E PO Take 1 tablet by mouth daily. Vegan Vitamin E     No current facility-administered medications on file prior to visit.    Allergies:  Allergies  Allergen Reactions  . Diflucan [Fluconazole] Other (See Comments)    Wheezing and throat pressue   Past Medical History:  Past Medical History:  Diagnosis Date  . 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 Migraines   . Yeast infection    Past Surgical History:  Past Surgical History:  Procedure Laterality Date  . APPLICATION OF CRANIAL NAVIGATION Left 04/29/2018   Procedure: APPLICATION OF CRANIAL NAVIGATION;  Surgeon: Earnie Larsson, MD;  Location: [facility];  Service: Neurosurgery;  Laterality: Left;  . CRANIOTOMY Left 04/29/2018   Procedure: LEFT CRANIOTOMY FOR  TUMOR BRAIN LAB;  Surgeon: Earnie Larsson, MD;  Location: [facility];  Service: Neurosurgery;  Laterality: Left;   Social History:  Social History   Socioeconomic History  . Marital status: Divorced    Spouse name: Not on file  . Number of children: Not on file  . Years of education: Not on file  . Highest education level: Not on file  Occupational History  . Not on file  Tobacco Use  . Smoking status: Never Smoker  . Smokeless tobacco: Never  Used  Substance and Sexual Activity  . Alcohol use: Yes  . Drug use: No  . Sexual activity: Yes    Birth control/protection: Condom  Other Topics Concern  . Not on file  Social History Narrative  . Not on file   Social Determinants of Health   Financial Resource Strain:   . Difficulty of Paying Living Expenses:   Food Insecurity:   . Worried About Charity fundraiser in the Last Year:   . Arboriculturist in the Last Year:   Transportation Needs:   . Film/video editor (Medical):   Marland Kitchen Lack of Transportation (Non-Medical):   Physical Activity:   . Days of Exercise per Week:   . Minutes of Exercise per Session:   Stress:   . Feeling of Stress :   Social Connections:   . Frequency of Communication with Friends and Family:   . Frequency of Social Gatherings with Friends and Family:   . Attends Religious Services:   . Active Member of Clubs or Organizations:   . Attends Archivist Meetings:   Marland Kitchen Marital Status:   Intimate Partner Violence:   . Fear of Current or Ex-Partner:   . Emotionally Abused:   Marland Kitchen Physically Abused:   . Sexually Abused:    Family History:  Family History  Problem Relation Age of Onset  . Cancer Father        lung    Review of Systems: Constitutional: Doesn't report fevers, chills or abnormal weight loss Eyes: Doesn't report blurriness of vision Ears, nose, mouth, throat, and face: Doesn't report sore throat Respiratory: Doesn't report cough, dyspnea or wheezes Cardiovascular: Doesn't report palpitation, chest discomfort  Gastrointestinal:  Doesn't report nausea, constipation, diarrhea GU: Doesn't report incontinence Skin: Doesn't report skin rashes Neurological: Per HPI Musculoskeletal: Doesn't report joint pain Behavioral/Psych: Doesn't report anxiety  Physical Exam: Vitals:   03/14/20 1055 03/14/20 1100  BP: 114/83 114/83  Pulse: 68 68  Resp: 18 18  Temp: 98.2 F (36.8 C) 98.2 F (36.8 C)  SpO2: 100% 100%   KPS:  90. General: Alert, cooperative, pleasant, in no acute distress Head: Normal EENT: No  conjunctival injection or scleral icterus.  Lungs: Resp effort normal Cardiac: Regular rate Abdomen: Non-distended abdomen Skin: No rashes cyanosis or petechiae. Extremities: No clubbing or edema  Neurologic Exam: Mental Status: Awake, alert, attentive to examiner. Oriented to self and environment. Language is fluent with intact comprehension.  Cranial Nerves: Visual acuity is grossly normal. Visual fields are full. Extra-ocular movements intact. No ptosis. Face is symmetric Motor: Tone and bulk are normal. Power is full in both arms and legs.  Sensory: Intact to light touch Gait: Normal.   Labs: I have reviewed the data as listed    Component Value Date/Time   NA 140 03/05/2020 1026   K 4.2 03/05/2020 1026   CL 105 03/05/2020 1026   CO2 25 03/05/2020 1026   GLUCOSE 82 03/05/2020 1026   BUN 11 03/05/2020 1026   CREATININE 0.91 03/05/2020 1026   CALCIUM 9.1 03/05/2020 1026   PROT 6.7 03/05/2020 1026   ALBUMIN 4.1 03/05/2020 1026   AST 50 (H) 03/05/2020 1026   ALT 35 03/05/2020 1026   ALKPHOS 98 03/05/2020 1026   BILITOT 0.3 03/05/2020 1026   GFRNONAA >60 03/05/2020 1026   GFRAA >60 03/05/2020 1026   Lab Results  Component Value Date   WBC 3.7 (L) 03/05/2020   NEUTROABS 2.3 03/05/2020   HGB 13.7 03/05/2020   HCT 41.6 03/05/2020   MCV 92.7 03/05/2020   PLT 257 03/05/2020    Imaging:  MRI brain images from OSH reviewed dated 03/08/20: [facility] Clinician Interpretation: I have personally reviewed the radiological images as listed.  My interpretation, in the context of the patient's clinical presentation, is stable disease   Assessment/Plan Brain metastasis [facility]) [C79.31]  [name] is clinically and radiographically stable today.  More strongly suspect radio-inflammatory effect to account for recent changes.   We ask that Leslie [name] return to clinic in 3 months  following next brain MRI, or sooner as needed.  We appreciate the opportunity to participate in the care of Leslie [name].    All questions were answered. The patient knows to call the clinic with any problems, questions or concerns. No barriers to learning were detected.  The total time spent in the encounter was 30 minutes and more than 50% was on counseling and review of test results   Ventura Sellers, MD Medical Director of Neuro-Oncology [facility] at [place] 03/14/20 10:23 AM

## 2020-03-18 NOTE — Progress Notes (Signed)
[facility] OFFICE PROGRESS NOTE  System, Pcp Not In No address on file  DIAGNOSIS: Metastatic high-grade neoplasm consistent with metastatic malignant melanoma based on the most recent pathology report from [facility] in October 2019, presented with large left left upper/left lower lobe mass with a hypermetabolic AP window lymph node as well as solitary metastatic brain lesion diagnosed in May 2019.  Biomarker Findings Microsatellite status - MS-Stable Tumor Mutational Burden - TMB-Intermediate (16 Muts/Mb) Genomic Findings For a complete list of the genes assayed, please refer to the Appendix. CD274 (PD-L1) amplification NRAS Q61R PDCD1LG2 (PD-L2) amplification MYC amplification EPHB1 amplification JAK2 amplification RB1 Q93*, I948* TERT promoter -146C>T TP53 C275W  PRIOR THERAPY: 1) left temporal craniotomy with resection of tumor with intraoperative stereotactic guidance for volumetric resection under the care of Dr. Annette Stable on 04/29/2018. 2) First-line treatment with immunotherapy with Keytruda 200 mg IV every 3 weeks. First dose June 02, 2018. Status post 3 cycles. 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 3) palliative radiotherapy to the left lung mass under the care of Dr. Lisbeth Renshaw completed on 08/24/2018. 4) Resuming her treatment again with Keytruda 200 mg IV every 3 weeks, first dose 09/08/2018. Status post 2 cycles.  CURRENT THERAPY: Treatment with immunotherapy with ipilimumab 3 mg/KG and nivolumab 1 mg/KG every 3 weeks for the first 4 cycles followed by maintenance nivolumab 240 mg IV every 2 weeks. First dose of this treatment October 26, 2018. Status post 24 cycles. Ipilimumab was discontinued after cycle #2 for the significant liver dysfunction. Starting from cycle #3 the patient is on treatment with single agent nivolumab.  INTERVAL HISTORY: Derrick Orris 44 y.o. female returns to clinic  today for a follow-up visit.  The patient recently had a appointment with Dr. Crisoforo Oxford at [facility] for repeat imaging with a brain MRI. The patient also had a visit with neuro oncologist, Dr. Mickeal Skinner. It was recommended that she have a repeat MRI in 3 months.   Regarding her systemic treatment with nivolumab, the patient is tolerating her treatment well without any concerning complaints except for mild generalized itching for which she uses [place] cream.  States she recently had her skin check performed by her dermatologist last month. She had several biopsies done which were all benign. She had a few mouth sores recently which are managed successfully with a mouth rinse consisting of clove and cinnamon.  She denies any recent fever, chills, or night sweats.  She denies any chest pain, shortness of breath, cough, or hemoptysis.  She denies any nausea, vomiting, diarrhea, or constipation.  She denies any chills.  She denies any headache, seizures, extremity weakness, or visual changes. She is scheduled to get a repeat PET scan at [facility] on 4/23. She is here today for evaluation before starting cycle #25 of nivolumab.    MEDICAL HISTORY: Past Medical History:  Diagnosis Date  . 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 Migraines   . Yeast infection     ALLERGIES:  is allergic to diflucan [fluconazole].  MEDICATIONS:  Current Outpatient Medications  Medication Sig Dispense Refill  . AMERICAN GINSENG PO Take 1,500-3,000 mg by mouth daily. Depending on level of fatigue    . Amino Acids (L-CARNITINE) LIQD Take 2,000 mg by mouth daily. Liquid L-Carnitine 2000 mg    . Ascorbic Acid (LIQUID C 500) 500 MG/15ML LIQD Take 1,000 mg by mouth daily.    . Beta Glucan POWD 1 Dose by  Does not apply route daily. Beta D Glucan 400 mg    . Calcium-Magnesium (CAL-MAG PO) Take 15 mLs by mouth daily. Marine Based Cal - Mag (500 mg Calcium / 250 mg Mag) 15 ml total    . Coenzyme Q10 (COQ-10) 100 MG CAPS  Take 1 tablet by mouth daily.    Marland Kitchen CREATINE MONOHYDRATE PO Take 6 g by mouth daily. Creatine MonoHydrate 6 g (on training cycle)    . doxylamine, Sleep, (UNISOM) 25 MG tablet Take 12.5 mg by mouth at bedtime as needed for sleep.    . ferrous sulfate 324 MG TBEC Take 324 mg by mouth 2 (two) times daily.    . Glucosamine-Chondroit-Vit C-Mn (GLUCOSAMINE 1500 COMPLEX PO) Take 1 tablet by mouth daily. Vegan CMO Glucosamine    . Hyaluronic Acid-Vitamin C (HYALURONIC ACID PO) Take 100 mg by mouth daily. Liquid Hyaluronic Acid 100 mg    . Iron-Folic Acid-Vit S85 (IRON FORMULA PO) Take 1 Dose by mouth daily. Iron 50 mg (1/2 before workout and 1/2 afterwards - cycle 1 wk off and on)    . Lysine POWD 1,600 mg by Does not apply route daily. L-Lysine Powder 1600 mg    . Methylsulfonylmethane (MSM) 1000 MG CAPS Take 4 capsules by mouth daily. 4000 mg (1/2 before workout and 1/2 afterwards)    . Multiple Vitamin (MULTIVITAMIN) tablet Take 1 tablet by mouth daily.    . Multiple Vitamins-Minerals (MY-VITALIFE) CAPS Take by mouth.    . Multiple Vitamins-Minerals (ZINC PO) Take 1 tablet by mouth daily. Raw Zinc 30 mg (cycle every other week)    . nivolumab (OPDIVO) 40 MG/4ML SOLN chemo injection Opdivo 40 mg/4 mL intravenous solution  INFUSE 40 MG OVER 30 MINUTE(S) BY INTRAVENOUS ROUTE EVERY 2 WEEKS    . NON FORMULARY Take 1 Dose by mouth daily. Cordyceps 750 mg    . NON FORMULARY Take 1 Dose by mouth daily. Liver Herbal Supplement 675 mg    . NON FORMULARY Take 1 Dose by mouth daily. Reishi Mushroom 500 mg    . NON FORMULARY Take 1 Dose by mouth daily. Curcumin Phytosome (Bioperrine) 3000 mg    . NON FORMULARY Take 1 tablet by mouth daily. Artemisnin 200 mg    . NON FORMULARY Take 1 tablet by mouth daily. Wellness Herbal Tablet as needed (Elderberry, Ashwagandha, etc)    . NON FORMULARY Take 1 tablet by mouth daily. Zinc L Carnosine Complex 75/59 mg (after workout)    . Omega 3-6-9 Fatty Acids (OMEGA-3-6-9 PO)  Take 2 capsules by mouth daily. Plant Based Omega 3-6-9 2 capsules = 1825 mg    . OVER THE COUNTER MEDICATION TK 5 MILLILITERS PO QID PRF MOUTH PAIN    . QUERCETIN PO Take 1 tablet by mouth daily. Quercetin 400 mg / Bromelain 85 mg    . RESVERATROL PO Take 1,200 mg by mouth daily. Trans Resveratrol    . SM Omega-3-6-9 Fatty Acids CAPS Take by mouth.    Marland Kitchen VITAMIN A PO Take 1 tablet by mouth daily. 1500 mcg (5000 IU)    . Vitamin D-Vitamin K (VITAMIN K2-VITAMIN D3 PO) Take 1 capsule by mouth daily. 5000 IU + 50 mcg    . vitamin E 180 MG (400 UNITS) capsule Take by mouth.    Marland Kitchen VITAMIN E PO Take 1 tablet by mouth daily. Vegan Vitamin E     No current facility-administered medications for this visit.    SURGICAL HISTORY:  Past Surgical History:  Procedure Laterality Date  . APPLICATION OF CRANIAL NAVIGATION Left 04/29/2018   Procedure: APPLICATION OF CRANIAL NAVIGATION;  Surgeon: Earnie Larsson, MD;  Location: [facility];  Service: Neurosurgery;  Laterality: Left;  . CRANIOTOMY Left 04/29/2018   Procedure: LEFT CRANIOTOMY FOR  TUMOR BRAIN LAB;  Surgeon: Earnie Larsson, MD;  Location: [facility];  Service: Neurosurgery;  Laterality: Left;    REVIEW OF SYSTEMS:   Review of Systems  Constitutional: Negative for appetite change, chills, fatigue, fever and unexpected weight change.  HENT: Positive for a few mild mouth sores. Negative for nosebleeds, sore throat and trouble swallowing.   Eyes: Negative for eye problems and icterus.  Respiratory: Negative for cough, hemoptysis, shortness of breath and wheezing.  Cardiovascular: Negative for chest pain and leg swelling.  Gastrointestinal: Negative for abdominal pain, constipation, diarrhea, nausea and vomiting.  Genitourinary: Negative for bladder incontinence, difficulty urinating, dysuria, frequency and hematuria.   Musculoskeletal: Negative for back pain, gait problem, neck pain and neck stiffness.  Skin: Positive for occasional generalized itching without rash.   Neurological: Negative for dizziness, extremity weakness, gait problem, headaches, light-headedness and seizures.  Hematological: Negative for adenopathy. Does not bruise/bleed easily.  Psychiatric/Behavioral: Negative for confusion, depression and sleep disturbance. The patient is not nervous/anxious.     PHYSICAL EXAMINATION:  Blood pressure 117/84, pulse 70, temperature 98.5 F (36.9 C), temperature source Temporal, resp. rate 18, height 5' 9"  (1.753 m), weight 144 lb 1.6 oz (65.4 kg), SpO2 100 %, unknown if currently breastfeeding.  ECOG PERFORMANCE STATUS: 1 - Symptomatic but completely ambulatory  Physical Exam  Constitutional: Oriented to person, place, and time and well-developed, well-nourished, and in no distress.  HENT:  Head: Normocephalic and atraumatic.  Mouth/Throat: Oropharynx is clear and moist. No oropharyngeal exudate.  Eyes: Conjunctivae are normal. Right eye exhibits no discharge. Left eye exhibits no discharge. No scleral icterus.  Neck: Normal range of motion. Neck supple.  Cardiovascular: Normal rate, regular rhythm, normal heart sounds and intact distal pulses.   Pulmonary/Chest: Effort normal and breath sounds normal. No respiratory distress. No wheezes. No rales.  Abdominal: Soft. Bowel sounds are normal. Exhibits no distension and no mass. There is no tenderness.  Musculoskeletal: Normal range of motion. Exhibits no edema.  Lymphadenopathy:    No cervical adenopathy.  Neurological: Alert and oriented to person, place, and time. Exhibits normal muscle tone. Gait normal. Coordination normal.  Skin: Skin is warm and dry. No rash noted. Not diaphoretic. No erythema. No pallor.  Psychiatric: Mood, memory and judgment normal.  Vitals reviewed.  LABORATORY DATA: Lab Results  Component Value Date   WBC 2.9 (L) 03/19/2020   HGB 14.0 03/19/2020   HCT 42.2 03/19/2020   MCV 92.7 03/19/2020   PLT 209 03/19/2020      Chemistry      Component Value Date/Time    NA 140 03/19/2020 1051   K 4.0 03/19/2020 1051   CL 107 03/19/2020 1051   CO2 24 03/19/2020 1051   BUN 12 03/19/2020 1051   CREATININE 0.94 03/19/2020 1051      Component Value Date/Time   CALCIUM 9.1 03/19/2020 1051   ALKPHOS 87 03/19/2020 1051   AST 39 03/19/2020 1051   ALT 33 03/19/2020 1051   BILITOT 0.4 03/19/2020 1051       RADIOGRAPHIC STUDIES:  No results found.   ASSESSMENT/PLAN:  This is a very pleasant 44 year old Caucasian female who presented with a large mass in theleftupper/lower lobe and mediastinal adenopathy as well as a  solitary brain metastasis highly suspicious for malignant melanoma.The final pathology report from [facility] malignancy was consistent with metastatic melanoma.  She is status post a left temporal craniotomy and resection of the tumor on 04/29/2018. The patient also completed stereotactic radiotherapy to the resection cavity under the care of Dr. Lisbeth Renshaw. She previously underwent treatment with immunotherapy with Keytruda 200 mg IV every 3 weeks. She is status post 3 cycles. CT scanafter cycle #3 showed enlargement of the left upper lobe lung mass. This was suspicious for pseudo-progression on immunotherapy. The patient was started on palliative course of radiotherapy to the left upper lobe mass and she tolerated this treatment fairly well. She was seen by Dr. Gavin Potters and she recommended for the patient to resume her treatment with [facility] until she undergoes further molecular studies. The patient was treated with 2 more cycles of Keytruda and tolerated the treatment well. The patient then had a CT scan performed which showed pericardial invasion. Therefore,the patient is not a good candidate for surgical resection.   From her final pathology report and recommendations from [facility], the patient was switched to immunotherapy withipilimumaband nivolumab. She isstatus post  2 cycles.Treatment was on hold for more than 6 weeks secondary to grade 3 hepatic dysfunction. She had improvement of her liver enzymes after a prolonged treatment with a tapering scheduleof steroids.  Sheiscurrently undergoing single agent nivolumab 240 mgIV every 2 weeks. She is status post an additional22cycles with nivolumab240 mg IV every 2 weeks. She has beentoleratingtreatment well   Labs were reviewed.  Recommend that she proceed with cycle #25 today as scheduled.  We will see her back for follow-up visit in 2 weeks for evaluation before starting cycle #26.  She will have her repeat PET scan on 4/23 at [facility] as planned.   She will continue to use her home remedies for her mouth sores and mild itching.   The patient was advised to call immediately if she has any concerning symptoms in the interval. The patient voices understanding of current disease status and treatment options and is in agreement with the current care plan. All questions were answered. The patient knows to call the clinic with any problems, questions or concerns. We can certainly see the patient much sooner if necessary  Orders Placed This Encounter  Procedures  . TSH    Standing Status:   Standing    Number of Occurrences:   18    Standing Expiration Date:   03/19/2021     Nimesh Riolo L Garlan Drewes, PA-C 03/19/20

## 2020-03-19 ENCOUNTER — Inpatient Hospital Stay (HOSPITAL_BASED_OUTPATIENT_CLINIC_OR_DEPARTMENT_OTHER): Payer: BC Managed Care – PPO | Admitting: Physician Assistant

## 2020-03-19 ENCOUNTER — Other Ambulatory Visit: Payer: Self-pay

## 2020-03-19 ENCOUNTER — Inpatient Hospital Stay: Payer: BC Managed Care – PPO

## 2020-03-19 VITALS — BP 117/84 | HR 70 | Temp 98.5°F | Resp 18 | Ht 69.0 in | Wt 144.1 lb

## 2020-03-19 DIAGNOSIS — Z5112 Encounter for antineoplastic immunotherapy: Secondary | ICD-10-CM

## 2020-03-19 DIAGNOSIS — C3432 Malignant neoplasm of lower lobe, left bronchus or lung: Secondary | ICD-10-CM

## 2020-03-19 DIAGNOSIS — Z923 Personal history of irradiation: Secondary | ICD-10-CM | POA: Diagnosis not present

## 2020-03-19 DIAGNOSIS — C7802 Secondary malignant neoplasm of left lung: Secondary | ICD-10-CM

## 2020-03-19 DIAGNOSIS — Z9221 Personal history of antineoplastic chemotherapy: Secondary | ICD-10-CM | POA: Diagnosis not present

## 2020-03-19 DIAGNOSIS — C7931 Secondary malignant neoplasm of brain: Secondary | ICD-10-CM | POA: Diagnosis not present

## 2020-03-19 DIAGNOSIS — C439 Malignant melanoma of skin, unspecified: Secondary | ICD-10-CM | POA: Diagnosis not present

## 2020-03-19 LAB — CMP (CANCER CENTER ONLY)
ALT: 33 U/L (ref 0–44)
AST: 39 U/L (ref 15–41)
Albumin: 4.1 g/dL (ref 3.5–5.0)
Alkaline Phosphatase: 87 U/L (ref 38–126)
Anion gap: 9 (ref 5–15)
BUN: 12 mg/dL (ref 6–20)
CO2: 24 mmol/L (ref 22–32)
Calcium: 9.1 mg/dL (ref 8.9–10.3)
Chloride: 107 mmol/L (ref 98–111)
Creatinine: 0.94 mg/dL (ref 0.44–1.00)
GFR, Est AFR Am: >60 mL/min (ref >60)
GFR, Estimated: >60 mL/min (ref >60)
Glucose, Bld: 85 mg/dL (ref 70–99)
Potassium: 4 mmol/L (ref 3.5–5.1)
Sodium: 140 mmol/L (ref 135–145)
Total Bilirubin: 0.4 mg/dL (ref 0.3–1.2)
Total Protein: 6.9 g/dL (ref 6.5–8.1)

## 2020-03-19 LAB — CBC WITH DIFFERENTIAL (CANCER CENTER ONLY)
Abs Immature Granulocytes: 0 10*3/uL (ref 0.00–0.07)
Basophils Absolute: 0 10*3/uL (ref 0.0–0.1)
Basophils Relative: 1 %
Eosinophils Absolute: 0 10*3/uL (ref 0.0–0.5)
Eosinophils Relative: 1 %
HCT: 42.2 % (ref 36.0–46.0)
Hemoglobin: 14 g/dL (ref 12.0–15.0)
Immature Granulocytes: 0 %
Lymphocytes Relative: 30 %
Lymphs Abs: 0.9 10*3/uL (ref 0.7–4.0)
MCH: 30.8 pg (ref 26.0–34.0)
MCHC: 33.2 g/dL (ref 30.0–36.0)
MCV: 92.7 fL (ref 80.0–100.0)
Monocytes Absolute: 0.4 10*3/uL (ref 0.1–1.0)
Monocytes Relative: 14 %
Neutro Abs: 1.6 10*3/uL — ABNORMAL LOW (ref 1.7–7.7)
Neutrophils Relative %: 54 %
Platelet Count: 209 10*3/uL (ref 150–400)
RBC: 4.55 MIL/uL (ref 3.87–5.11)
RDW: 13.1 % (ref 11.5–15.5)
WBC Count: 2.9 10*3/uL — ABNORMAL LOW (ref 4.0–10.5)
nRBC: 0 % (ref 0.0–0.2)

## 2020-03-19 MED ORDER — SODIUM CHLORIDE 0.9 % IV SOLN
Freq: Once | INTRAVENOUS | Status: AC
  Filled 2020-03-19: qty 250

## 2020-03-19 MED ORDER — SODIUM CHLORIDE 0.9 % IV SOLN
240.0000 mg | Freq: Once | INTRAVENOUS | Status: AC
  Administered 2020-03-19: 240 mg via INTRAVENOUS
  Filled 2020-03-19: qty 24

## 2020-03-19 NOTE — Patient Instructions (Signed)
[facility] Discharge Instructions for Patients Receiving Chemotherapy  Today you received the following chemotherapy agents: opdivo  To help prevent nausea and vomiting after your treatment, we encourage you to take your nausea medication as directed.   If you develop nausea and vomiting that is not controlled by your nausea medication, call the clinic.   BELOW ARE SYMPTOMS THAT SHOULD BE REPORTED IMMEDIATELY:  *FEVER GREATER THAN 100.5 F  *CHILLS WITH OR WITHOUT FEVER  NAUSEA AND VOMITING THAT IS NOT CONTROLLED WITH YOUR NAUSEA MEDICATION  *UNUSUAL SHORTNESS OF BREATH  *UNUSUAL BRUISING OR BLEEDING  TENDERNESS IN MOUTH AND THROAT WITH OR WITHOUT PRESENCE OF ULCERS  *URINARY PROBLEMS  *BOWEL PROBLEMS  UNUSUAL RASH Items with * indicate a potential emergency and should be followed up as soon as possible.  Feel free to call the clinic should you have any questions or concerns. The clinic phone number is [phone].  Please show the [facility] at check-in to the Emergency Department and triage nurse.

## 2020-03-21 ENCOUNTER — Telehealth: Payer: Self-pay | Admitting: Physician Assistant

## 2020-03-21 NOTE — Telephone Encounter (Signed)
Scheduled per los. Called and left msg. Mailed printou

## 2020-03-25 ENCOUNTER — Telehealth: Payer: Self-pay | Admitting: Internal Medicine

## 2020-03-25 NOTE — Telephone Encounter (Signed)
Faxed records to [facility] at [phone]. Release ID [id number]

## 2020-03-27 DIAGNOSIS — C7931 Secondary malignant neoplasm of brain: Secondary | ICD-10-CM | POA: Diagnosis not present

## 2020-03-27 NOTE — Progress Notes (Signed)
Pharmacist Chemotherapy Monitoring - Follow Up Assessment    I verify that I have reviewed each item in the below checklist:  . Regimen for the patient is scheduled for the appropriate day and plan matches scheduled date. Marland Kitchen Appropriate non-routine labs are ordered dependent on drug ordered. . If applicable, additional medications reviewed and ordered per protocol based on lifetime cumulative doses and/or treatment regimen.   Plan for follow-up and/or issues identified: No . I-vent associated with next due treatment: No . MD and/or nursing notified: No   Kennith Center, Pharm.D., CPP 03/27/2020@2 :46 PM

## 2020-04-02 ENCOUNTER — Inpatient Hospital Stay (HOSPITAL_BASED_OUTPATIENT_CLINIC_OR_DEPARTMENT_OTHER): Payer: BC Managed Care – PPO | Admitting: Internal Medicine

## 2020-04-02 ENCOUNTER — Inpatient Hospital Stay: Payer: BC Managed Care – PPO

## 2020-04-02 ENCOUNTER — Other Ambulatory Visit: Payer: Self-pay

## 2020-04-02 ENCOUNTER — Encounter: Payer: Self-pay | Admitting: Internal Medicine

## 2020-04-02 VITALS — BP 103/66 | HR 61 | Temp 98.5°F | Resp 18 | Ht 69.0 in | Wt 147.9 lb

## 2020-04-02 DIAGNOSIS — Z5112 Encounter for antineoplastic immunotherapy: Secondary | ICD-10-CM

## 2020-04-02 DIAGNOSIS — C7802 Secondary malignant neoplasm of left lung: Secondary | ICD-10-CM

## 2020-04-02 DIAGNOSIS — Z923 Personal history of irradiation: Secondary | ICD-10-CM | POA: Diagnosis not present

## 2020-04-02 DIAGNOSIS — Z9221 Personal history of antineoplastic chemotherapy: Secondary | ICD-10-CM | POA: Diagnosis not present

## 2020-04-02 DIAGNOSIS — C3432 Malignant neoplasm of lower lobe, left bronchus or lung: Secondary | ICD-10-CM | POA: Diagnosis not present

## 2020-04-02 DIAGNOSIS — C439 Malignant melanoma of skin, unspecified: Secondary | ICD-10-CM | POA: Diagnosis not present

## 2020-04-02 DIAGNOSIS — C7931 Secondary malignant neoplasm of brain: Secondary | ICD-10-CM

## 2020-04-02 LAB — CBC WITH DIFFERENTIAL (CANCER CENTER ONLY)
Abs Immature Granulocytes: 0.01 10*3/uL (ref 0.00–0.07)
Basophils Absolute: 0.1 10*3/uL (ref 0.0–0.1)
Basophils Relative: 1 %
Eosinophils Absolute: 0.2 10*3/uL (ref 0.0–0.5)
Eosinophils Relative: 4 %
HCT: 41.1 % (ref 36.0–46.0)
Hemoglobin: 13.7 g/dL (ref 12.0–15.0)
Immature Granulocytes: 0 %
Lymphocytes Relative: 22 %
Lymphs Abs: 0.9 10*3/uL (ref 0.7–4.0)
MCH: 30.9 pg (ref 26.0–34.0)
MCHC: 33.3 g/dL (ref 30.0–36.0)
MCV: 92.6 fL (ref 80.0–100.0)
Monocytes Absolute: 0.5 10*3/uL (ref 0.1–1.0)
Monocytes Relative: 11 %
Neutro Abs: 2.6 10*3/uL (ref 1.7–7.7)
Neutrophils Relative %: 62 %
Platelet Count: 320 10*3/uL (ref 150–400)
RBC: 4.44 MIL/uL (ref 3.87–5.11)
RDW: 13.1 % (ref 11.5–15.5)
WBC Count: 4.2 10*3/uL (ref 4.0–10.5)
nRBC: 0 % (ref 0.0–0.2)

## 2020-04-02 LAB — CMP (CANCER CENTER ONLY)
ALT: 30 U/L (ref 0–44)
AST: 40 U/L (ref 15–41)
Albumin: 3.9 g/dL (ref 3.5–5.0)
Alkaline Phosphatase: 81 U/L (ref 38–126)
Anion gap: 9 (ref 5–15)
BUN: 9 mg/dL (ref 6–20)
CO2: 25 mmol/L (ref 22–32)
Calcium: 9 mg/dL (ref 8.9–10.3)
Chloride: 104 mmol/L (ref 98–111)
Creatinine: 0.93 mg/dL (ref 0.44–1.00)
GFR, Est AFR Am: >60 mL/min (ref >60)
GFR, Estimated: >60 mL/min (ref >60)
Glucose, Bld: 88 mg/dL (ref 70–99)
Potassium: 4.1 mmol/L (ref 3.5–5.1)
Sodium: 138 mmol/L (ref 135–145)
Total Bilirubin: 0.3 mg/dL (ref 0.3–1.2)
Total Protein: 6.7 g/dL (ref 6.5–8.1)

## 2020-04-02 LAB — TSH: TSH: 1.555 u[IU]/mL (ref 0.308–3.960)

## 2020-04-02 MED ORDER — SODIUM CHLORIDE 0.9 % IV SOLN
Freq: Once | INTRAVENOUS | Status: AC
  Filled 2020-04-02: qty 250

## 2020-04-02 MED ORDER — SODIUM CHLORIDE 0.9 % IV SOLN
240.0000 mg | Freq: Once | INTRAVENOUS | Status: AC
  Administered 2020-04-02: 240 mg via INTRAVENOUS
  Filled 2020-04-02: qty 24

## 2020-04-02 NOTE — Progress Notes (Signed)
[place] [phone]   [phone]  OFFICE PROGRESS NOTE  System, Pcp Not In No address on file  DIAGNOSIS: Metastatic high-grade neoplasm consistent with metastatic malignant melanoma based on the most recent pathology report from [facility] in October 2019, presented with large left left upper/left lower lobe mass with a hypermetabolic AP window lymph node as well as solitary metastatic brain lesion diagnosed in May 2019.  Biomarker Findings Microsatellite status - MS-Stable Tumor Mutational Burden - TMB-Intermediate (16 Muts/Mb) Genomic Findings For a complete list of the genes assayed, please refer to the Appendix. CD274 (PD-L1) amplification NRAS Q61R PDCD1LG2 (PD-L2) amplification MYC amplification EPHB1 amplification JAK2 amplification RB1 Q93*, H702* TERT promoter -146C>T TP53 C275W   PRIOR THERAPY: 1) left temporal craniotomy with resection of tumor with intraoperative stereotactic guidance for volumetric resection under the care of Dr. Annette Stable on 04/29/2018. 2) First-line treatment with immunotherapy with Keytruda 200 mg IV every 3 weeks.  First dose June 02, 2018.  Status post 3 cycles.  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 3) palliative radiotherapy to the left lung mass under the care of Dr. Lisbeth Renshaw completed on 08/24/2018. 4) Resuming her treatment again with Keytruda 200 mg IV every 3 weeks, first dose 09/08/2018.  Status post 2 cycles.  CURRENT THERAPY: Treatment with immunotherapy with ipilimumab 3 mg/KG and nivolumab 1 mg/KG every 3 weeks for the first 4 cycles followed by maintenance nivolumab 240 mg IV every 2 weeks.  First dose of this treatment October 26, 2018.  Status post 25 cycles.  Ipilimumab was discontinued after cycle #2 for the significant liver dysfunction.  Starting from cycle #3 the patient is on treatment with single agent nivolumab.  INTERVAL  HISTORY: Leslie [name] 44 y.o. female returns to the clinic today for follow-up visit.  The patient is feeling fine today with no concerning complaints.  She continues to tolerate her treatment with nivolumab fairly well.  She denied having any chest pain, shortness of breath, cough or hemoptysis.  She denied having any fever or chills.  She has no nausea, vomiting, diarrhea or constipation.  She had [facility] the [facility] performed [facility] that was unremarkable for any disease progression.  She is expected to have repeat PET scan next week.  The patient is here today for evaluation before starting cycle #26 of her treatment.  MEDICAL HISTORY: Past Medical History:  Diagnosis Date  . 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 Migraines   . Yeast infection     ALLERGIES:  is allergic to diflucan [fluconazole].  MEDICATIONS:  Current Outpatient Medications  Medication Sig Dispense Refill  . AMERICAN GINSENG PO Take 1,500-3,000 mg by mouth daily. Depending on level of fatigue    . Amino Acids (L-CARNITINE) LIQD Take 2,000 mg by mouth daily. Liquid L-Carnitine 2000 mg    . Ascorbic Acid (LIQUID C 500) 500 MG/15ML LIQD Take 1,000 mg by mouth daily.    . Beta Glucan POWD 1 Dose by Does not apply route daily. Beta D Glucan 400 mg    . Calcium-Magnesium (CAL-MAG PO) Take 15 mLs by mouth daily. Marine Based Cal - Mag (500 mg Calcium / 250 mg Mag) 15 ml total    . Coenzyme Q10 (COQ-10) 100 MG CAPS Take 1 tablet by mouth daily.    Marland Kitchen CREATINE MONOHYDRATE PO Take 6 g by mouth daily. Creatine MonoHydrate 6 g (on  training cycle)    . doxylamine, Sleep, (UNISOM) 25 MG tablet Take 12.5 mg by mouth at bedtime as needed for sleep.    . ferrous sulfate 324 MG TBEC Take 324 mg by mouth 2 (two) times daily.    . Glucosamine-Chondroit-Vit C-Mn (GLUCOSAMINE 1500 COMPLEX PO) Take 1 tablet by mouth daily. Vegan CMO Glucosamine    . Hyaluronic Acid-Vitamin C (HYALURONIC ACID PO) Take  100 mg by mouth daily. Liquid Hyaluronic Acid 100 mg    . Iron-Folic Acid-Vit U38 (IRON FORMULA PO) Take 1 Dose by mouth daily. Iron 50 mg (1/2 before workout and 1/2 afterwards - cycle 1 wk off and on)    . Lysine POWD 1,600 mg by Does not apply route daily. L-Lysine Powder 1600 mg    . Methylsulfonylmethane (MSM) 1000 MG CAPS Take 4 capsules by mouth daily. 4000 mg (1/2 before workout and 1/2 afterwards)    . Multiple Vitamin (MULTIVITAMIN) tablet Take 1 tablet by mouth daily.    . Multiple Vitamins-Minerals (MY-VITALIFE) CAPS Take by mouth.    . Multiple Vitamins-Minerals (ZINC PO) Take 1 tablet by mouth daily. Raw Zinc 30 mg (cycle every other week)    . nivolumab (OPDIVO) 40 MG/4ML SOLN chemo injection Opdivo 40 mg/4 mL intravenous solution  INFUSE 40 MG OVER 30 MINUTE(S) BY INTRAVENOUS ROUTE EVERY 2 WEEKS    . NON FORMULARY Take 1 Dose by mouth daily. Cordyceps 750 mg    . NON FORMULARY Take 1 Dose by mouth daily. Liver Herbal Supplement 675 mg    . NON FORMULARY Take 1 Dose by mouth daily. Reishi Mushroom 500 mg    . NON FORMULARY Take 1 Dose by mouth daily. Curcumin Phytosome (Bioperrine) 3000 mg    . NON FORMULARY Take 1 tablet by mouth daily. Artemisnin 200 mg    . NON FORMULARY Take 1 tablet by mouth daily. Wellness Herbal Tablet as needed (Elderberry, Ashwagandha, etc)    . NON FORMULARY Take 1 tablet by mouth daily. Zinc L Carnosine Complex 75/59 mg (after workout)    . Omega 3-6-9 Fatty Acids (OMEGA-3-6-9 PO) Take 2 capsules by mouth daily. Plant Based Omega 3-6-9 2 capsules = 1825 mg    . OVER THE COUNTER MEDICATION TK 5 MILLILITERS PO QID PRF MOUTH PAIN    . QUERCETIN PO Take 1 tablet by mouth daily. Quercetin 400 mg / Bromelain 85 mg    . RESVERATROL PO Take 1,200 mg by mouth daily. Trans Resveratrol    . SM Omega-3-6-9 Fatty Acids CAPS Take by mouth.    Marland Kitchen VITAMIN A PO Take 1 tablet by mouth daily. 1500 mcg (5000 IU)    . Vitamin D-Vitamin K (VITAMIN K2-VITAMIN D3 PO) Take 1  capsule by mouth daily. 5000 IU + 50 mcg    . vitamin E 180 MG (400 UNITS) capsule Take by mouth.    Marland Kitchen VITAMIN E PO Take 1 tablet by mouth daily. Vegan Vitamin E     No current facility-administered medications for this visit.    SURGICAL HISTORY:  Past Surgical History:  Procedure Laterality Date  . APPLICATION OF CRANIAL NAVIGATION Left 04/29/2018   Procedure: APPLICATION OF CRANIAL NAVIGATION;  Surgeon: Earnie Larsson, MD;  Location: [place];  Service: Neurosurgery;  Laterality: Left;  . CRANIOTOMY Left 04/29/2018   Procedure: LEFT CRANIOTOMY FOR  TUMOR BRAIN LAB;  Surgeon: Earnie Larsson, MD;  Location: [place];  Service: Neurosurgery;  Laterality: Left;    REVIEW OF SYSTEMS:  A  comprehensive review of systems was negative.   PHYSICAL EXAMINATION: General appearance: alert, cooperative and no distress Head: Normocephalic, without obvious abnormality, atraumatic Neck: no adenopathy, no JVD, supple, symmetrical, trachea midline and thyroid not enlarged, symmetric, no tenderness/mass/nodules Lymph nodes: Cervical, supraclavicular, and axillary nodes normal. Resp: clear to auscultation bilaterally Back: negative, symmetric, no curvature. ROM normal. No CVA tenderness. Cardio: regular rate and rhythm, S1, S2 normal, no murmur, click, rub or gallop GI: soft, non-tender; bowel sounds normal; no masses,  no organomegaly Extremities: extremities normal, atraumatic, no cyanosis or edema  ECOG PERFORMANCE STATUS: 0 - Asymptomatic  Blood pressure 103/66, pulse 61, temperature 98.5 F (36.9 C), temperature source Temporal, resp. rate 18, height 5' 9"  (1.753 m), weight 147 lb 14.4 oz (67.1 kg), SpO2 99 %, unknown if currently breastfeeding.  LABORATORY DATA: Lab Results  Component Value Date   WBC 4.2 04/02/2020   HGB 13.7 04/02/2020   HCT 41.1 04/02/2020   MCV 92.6 04/02/2020   PLT 320 04/02/2020      Chemistry      Component Value Date/Time   NA 138 04/02/2020 1053   K 4.1 04/02/2020 1053    CL 104 04/02/2020 1053   CO2 25 04/02/2020 1053   BUN 9 04/02/2020 1053   CREATININE 0.93 04/02/2020 1053      Component Value Date/Time   CALCIUM 9.0 04/02/2020 1053   ALKPHOS 81 04/02/2020 1053   AST 40 04/02/2020 1053   ALT 30 04/02/2020 1053   BILITOT 0.3 04/02/2020 1053       RADIOGRAPHIC STUDIES: No results found.  ASSESSMENT AND PLAN: This is a very pleasant 44 years old white female with highly suspicious metastatic malignant melanoma presented with large mass in the left upper/left lower lobe and mediastinal lymphadenopathy as well as solitary brain metastasis status post left temporal craniotomy and resection of tumor on 04/29/2018 and she is recovering well from her surgery. The patient completed stereotactic radiotherapy to the resection cavity next week under the care of Dr. Lisbeth Renshaw. The patient underwent treatment with immunotherapy with Keytruda 200 mg IV every 3 weeks status post 3 cycles.   Her scan after cycle #3 showed enlargement of the left upper lobe lung mass.  This was also suspicious for pseudo-progression on immunotherapy.  The patient was started on a palliative course of radiotherapy to the left upper lobe lung mass and she tolerated this treatment fairly well.  She was seen by Dr. Emelda Brothers at [facility] and she recommended for the patient to resume her treatment with [facility] for now until she undergoes further molecular studies.  The patient was treated with 2 more cycles of Keytruda and tolerated the treatment well. Her recent CT scan of the chest, abdomen and pelvis showed some improvement in the left upper lobe lung mass but there is still concern about pericardial invasion. I personally and independently reviewed the scan images and discussed the result and showed the images to the patient and her boyfriend today.  She is still not a good candidate for surgical resection because of the pericardial invasion. The final pathology report and  recommendation from [facility] was consistent with metastatic melanoma and the recommendation is to switch the patient to a combination immunotherapy with Ipilumumab and nivolumab. The patient underwent treatment with immunotherapy with ipilimumab and nivolumab status post 2 cycles.  Her treatment was on hold for more than 6 weeks secondary to grade 3 hepatic dysfunction.  She had improvement of her liver enzyme after a  prolonged treatment with a steroid with a tapering schedule. She resumed her treatment again with single agent nivolumab.  She is status post 25 cycles of treatment. She continues to tolerate this treatment well with no concerning adverse effects. She had MRI of the brain performed at at [facility] late March 2021 and it was unremarkable for any disease progression. I recommended for the patient to continue her current treatment with nivolumab and she will proceed with cycle #26 today. She is expected to have a PET scan next week at [facility]. I will see the patient back for follow-up visit in 2 weeks for evaluation before the next cycle of her treatment. She was advised to call immediately if she has any concerning symptoms in the interval. The patient voices understanding of current disease status and treatment options and is in agreement with the current care plan. All questions were answered. The patient knows to call the clinic with any problems, questions or concerns. We can certainly see the patient much sooner if necessary.  Disclaimer: This note was dictated with voice recognition software. Similar sounding words can inadvertently be transcribed and may not be corrected upon review.

## 2020-04-03 ENCOUNTER — Telehealth: Payer: Self-pay

## 2020-04-03 ENCOUNTER — Telehealth: Payer: Self-pay | Admitting: Internal Medicine

## 2020-04-03 NOTE — Telephone Encounter (Signed)
TC from [place] with [employer] inquiring about disability forms faxed on 03/28/20. TC to [employer] confirmed papers were received. TC back to [facility] left message that papers were received. [phone] ext [phone]

## 2020-04-03 NOTE — Telephone Encounter (Signed)
Scheduled per los. Called and left msg. Mailed printout  °

## 2020-04-05 ENCOUNTER — Encounter: Payer: Self-pay | Admitting: *Deleted

## 2020-04-05 ENCOUNTER — Other Ambulatory Visit: Payer: BC Managed Care – PPO

## 2020-04-05 DIAGNOSIS — J9811 Atelectasis: Secondary | ICD-10-CM | POA: Diagnosis not present

## 2020-04-05 DIAGNOSIS — Z9889 Other specified postprocedural states: Secondary | ICD-10-CM | POA: Diagnosis not present

## 2020-04-05 DIAGNOSIS — C799 Secondary malignant neoplasm of unspecified site: Secondary | ICD-10-CM | POA: Diagnosis not present

## 2020-04-05 DIAGNOSIS — Z923 Personal history of irradiation: Secondary | ICD-10-CM | POA: Diagnosis not present

## 2020-04-05 DIAGNOSIS — Z85820 Personal history of malignant melanoma of skin: Secondary | ICD-10-CM | POA: Diagnosis not present

## 2020-04-05 DIAGNOSIS — C439 Malignant melanoma of skin, unspecified: Secondary | ICD-10-CM | POA: Diagnosis not present

## 2020-04-05 DIAGNOSIS — C792 Secondary malignant neoplasm of skin: Secondary | ICD-10-CM | POA: Diagnosis not present

## 2020-04-05 DIAGNOSIS — Z6821 Body mass index (BMI) 21.0-21.9, adult: Secondary | ICD-10-CM | POA: Diagnosis not present

## 2020-04-05 DIAGNOSIS — C7931 Secondary malignant neoplasm of brain: Secondary | ICD-10-CM | POA: Diagnosis not present

## 2020-04-08 ENCOUNTER — Ambulatory Visit: Payer: BC Managed Care – PPO | Admitting: Internal Medicine

## 2020-04-08 NOTE — Telephone Encounter (Signed)
Connected with patient.  Reports Dr. Julien Nordmann advised her to maintain disability status.  "Experiencing digestive issues, fatigue, mouth sores and insomnia.  Have been able to manage but realistically I am unable to work 40 hrs a week."

## 2020-04-11 DIAGNOSIS — F419 Anxiety disorder, unspecified: Secondary | ICD-10-CM | POA: Diagnosis not present

## 2020-04-11 NOTE — Progress Notes (Signed)
Pharmacist Chemotherapy Monitoring - Follow Up Assessment    I verify that I have reviewed each item in the below checklist:  . Regimen for the patient is scheduled for the appropriate day and plan matches scheduled date. Marland Kitchen Appropriate non-routine labs are ordered dependent on drug ordered. . If applicable, additional medications reviewed and ordered per protocol based on lifetime cumulative doses and/or treatment regimen.   Plan for follow-up and/or issues identified: No . I-vent associated with next due treatment: No . MD and/or nursing notified: No  Junita Kubota K 04/11/2020 10:51 AM

## 2020-04-15 NOTE — Progress Notes (Signed)
[place] OFFICE PROGRESS NOTE  System, Pcp Not In No address on file  DIAGNOSIS: Metastatic high-grade neoplasm consistent with metastatic malignant melanoma based on the most recent pathology report from [facility] in October 2019, presented with large left left upper/left lower lobe mass with a hypermetabolic AP window lymph node as well as solitary metastatic brain lesion diagnosed in May 2019.  Biomarker Findings Microsatellite status - MS-Stable Tumor Mutational Burden - TMB-Intermediate (16 Muts/Mb) Genomic Findings For a complete list of the genes assayed, please refer to the Appendix. CD274 (PD-L1) amplification NRAS Q61R PDCD1LG2 (PD-L2) amplification MYC amplification EPHB1 amplification JAK2 amplification RB1 Q93*, L381* TERT promoter -146C>T TP53 C275W  PRIOR THERAPY: 1) left temporal craniotomy with resection of tumor with intraoperative stereotactic guidance for volumetric resection under the care of Dr. Annette Stable on 04/29/2018. 2) First-line treatment with immunotherapy with Keytruda 200 mg IV every 3 weeks. First dose June 02, 2018. Status post 3 cycles. 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 3) palliative radiotherapy to the left lung mass under the care of Dr. Lisbeth Renshaw completed on 08/24/2018. 4) Resuming her treatment again with Keytruda 200 mg IV every 3 weeks, first dose 09/08/2018. Status post 2 cycles.  CURRENT THERAPY: Treatment with immunotherapy with ipilimumab 3 mg/KG and nivolumab 1 mg/KG every 3 weeks for the first 4 cycles followed by maintenance nivolumab 240 mg IV every 2 weeks. First dose of this treatment October 26, 2018. Status post 26cycles. Ipilimumab was discontinued after cycle #2 for the significant liver dysfunction. Starting from cycle #3 the patient is on treatment with single agent nivolumab.  INTERVAL HISTORY: Leslie [name] 44 y.o. female returns to the clinic  today for a follow-up visit.  The patient is feeling well today without any concerning complaints except she has not been sleeping well lately and weight loss. She believes this may be secondary to stress. She has tried unisom and acupuncture. She has tried several herbal supplements in the past.  The patient recently met with Dr. Crisoforo Oxford at [facility].  She had a repeat PET scan performed recently which did not show any definite evidence of recurrence.   Otherwise, she has been tolerating her treatment with immunotherapy with nivolumab well without any adverse side effects except for mild generalized itching for which she uses [place] cream.  She is getting her routine skin checks performed by her dermatologist every 6 months.  She denies any fever, chills, night sweats, or weight loss.  She denies any chest pain, shortness of breath, cough, or hemoptysis.  She denies any nausea, vomiting, diarrhea, or constipation.  She denies any headache or visual changes.  She is here today for evaluation before starting cycle #26 of nivolumab.  MEDICAL HISTORY: Past Medical History:  Diagnosis Date  . 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 Migraines   . Yeast infection     ALLERGIES:  is allergic to diflucan [fluconazole].  MEDICATIONS:  Current Outpatient Medications  Medication Sig Dispense Refill  . AMERICAN GINSENG PO Take 1,500-3,000 mg by mouth daily. Depending on level of fatigue    . Amino Acids (L-CARNITINE) LIQD Take 2,000 mg by mouth daily. Liquid L-Carnitine 2000 mg    . Ascorbic Acid (LIQUID C 500) 500 MG/15ML LIQD Take 1,000 mg by mouth daily.    . Beta Glucan POWD 1 Dose by Does not apply route daily. Beta D Glucan 400 mg    . Calcium-Magnesium (CAL-MAG PO) Take  15 mLs by mouth daily. Marine Based Cal - Mag (500 mg Calcium / 250 mg Mag) 15 ml total    . Coenzyme Q10 (COQ-10) 100 MG CAPS Take 1 tablet by mouth daily.    Marland Kitchen CREATINE MONOHYDRATE PO Take 6 g by mouth daily.  Creatine MonoHydrate 6 g (on training cycle)    . doxylamine, Sleep, (UNISOM) 25 MG tablet Take 12.5 mg by mouth at bedtime as needed for sleep.    . ferrous sulfate 324 MG TBEC Take 324 mg by mouth 2 (two) times daily.    . Glucosamine-Chondroit-Vit C-Mn (GLUCOSAMINE 1500 COMPLEX PO) Take 1 tablet by mouth daily. Vegan CMO Glucosamine    . Hyaluronic Acid-Vitamin C (HYALURONIC ACID PO) Take 100 mg by mouth daily. Liquid Hyaluronic Acid 100 mg    . Iron-Folic Acid-Vit M78 (IRON FORMULA PO) Take 1 Dose by mouth daily. Iron 50 mg (1/2 before workout and 1/2 afterwards - cycle 1 wk off and on)    . Lysine POWD 1,600 mg by Does not apply route daily. L-Lysine Powder 1600 mg    . Methylsulfonylmethane (MSM) 1000 MG CAPS Take 4 capsules by mouth daily. 4000 mg (1/2 before workout and 1/2 afterwards)    . Multiple Vitamin (MULTIVITAMIN) tablet Take 1 tablet by mouth daily.    . Multiple Vitamins-Minerals (MY-VITALIFE) CAPS Take by mouth.    . Multiple Vitamins-Minerals (ZINC PO) Take 1 tablet by mouth daily. Raw Zinc 30 mg (cycle every other week)    . nivolumab (OPDIVO) 40 MG/4ML SOLN chemo injection Opdivo 40 mg/4 mL intravenous solution  INFUSE 40 MG OVER 30 MINUTE(S) BY INTRAVENOUS ROUTE EVERY 2 WEEKS    . NON FORMULARY Take 1 Dose by mouth daily. Cordyceps 750 mg    . NON FORMULARY Take 1 Dose by mouth daily. Liver Herbal Supplement 675 mg    . NON FORMULARY Take 1 Dose by mouth daily. Reishi Mushroom 500 mg    . NON FORMULARY Take 1 Dose by mouth daily. Curcumin Phytosome (Bioperrine) 3000 mg    . NON FORMULARY Take 1 tablet by mouth daily. Artemisnin 200 mg    . NON FORMULARY Take 1 tablet by mouth daily. Wellness Herbal Tablet as needed (Elderberry, Ashwagandha, etc)    . NON FORMULARY Take 1 tablet by mouth daily. Zinc L Carnosine Complex 75/59 mg (after workout)    . Omega 3-6-9 Fatty Acids (OMEGA-3-6-9 PO) Take 2 capsules by mouth daily. Plant Based Omega 3-6-9 2 capsules = 1825 mg    . OVER  THE COUNTER MEDICATION TK 5 MILLILITERS PO QID PRF MOUTH PAIN    . QUERCETIN PO Take 1 tablet by mouth daily. Quercetin 400 mg / Bromelain 85 mg    . RESVERATROL PO Take 1,200 mg by mouth daily. Trans Resveratrol    . SM Omega-3-6-9 Fatty Acids CAPS Take by mouth.    Marland Kitchen VITAMIN A PO Take 1 tablet by mouth daily. 1500 mcg (5000 IU)    . Vitamin D-Vitamin K (VITAMIN K2-VITAMIN D3 PO) Take 1 capsule by mouth daily. 5000 IU + 50 mcg    . vitamin E 180 MG (400 UNITS) capsule Take by mouth.    Marland Kitchen VITAMIN E PO Take 1 tablet by mouth daily. Vegan Vitamin E     No current facility-administered medications for this visit.    SURGICAL HISTORY:  Past Surgical History:  Procedure Laterality Date  . APPLICATION OF CRANIAL NAVIGATION Left 04/29/2018   Procedure: APPLICATION OF CRANIAL  NAVIGATION;  Surgeon: Earnie Larsson, MD;  Location: [place];  Service: Neurosurgery;  Laterality: Left;  . CRANIOTOMY Left 04/29/2018   Procedure: LEFT CRANIOTOMY FOR  TUMOR BRAIN LAB;  Surgeon: Earnie Larsson, MD;  Location: [place];  Service: Neurosurgery;  Laterality: Left;    REVIEW OF SYSTEMS:   Review of Systems  Constitutional: Positive for weight loss. negative for appetite change, chills, fatigue, and fever HENT: . Negative for nosebleeds, sore throat and trouble swallowing.   Eyes: Negative for eye problems and icterus.  Respiratory: Negative for cough, hemoptysis, shortness of breath and wheezing.  Cardiovascular: Negative for chest pain and leg swelling.  Gastrointestinal: Negative for abdominal pain, constipation, diarrhea, nausea and vomiting.  Genitourinary: Negative for bladder incontinence, difficulty urinating, dysuria, frequency and hematuria.   Musculoskeletal: Negative for back pain, gait problem, neck pain and neck stiffness.  Skin:Positive for occasional generalized itching without rash. Neurological: Negative for dizziness, extremity weakness, gait problem, headaches, light-headedness and seizures.   Hematological: Negative for adenopathy. Does not bruise/bleed easily.  Psychiatric/Behavioral: Positive for insomnia. negative for confusion, depression  The patient is not nervous/anxious.     PHYSICAL EXAMINATION:  Blood pressure 117/76, pulse 67, temperature 98.2 F (36.8 C), temperature source Temporal, resp. rate 17, height 5' 9"  (1.753 m), weight 141 lb 14.4 oz (64.4 kg), SpO2 100 %, unknown if currently breastfeeding.  ECOG PERFORMANCE STATUS: 1 - Symptomatic but completely ambulatory  Physical Exam  Constitutional: Oriented to person, place, and time and well-developed, well-nourished, and in no distress.  HENT:  Head: Normocephalic and atraumatic.  Mouth/Throat: Oropharynx is clear and moist. No oropharyngeal exudate.  Eyes: Conjunctivae are normal. Right eye exhibits no discharge. Left eye exhibits no discharge. No scleral icterus.  Neck: Normal range of motion. Neck supple.  Cardiovascular: Normal rate, regular rhythm, normal heart sounds and intact distal pulses.   Pulmonary/Chest: Effort normal and breath sounds normal. No respiratory distress. No wheezes. No rales.  Abdominal: Soft. Bowel sounds are normal. Exhibits no distension and no mass. There is no tenderness.  Musculoskeletal: Normal range of motion. Exhibits no edema.  Lymphadenopathy:    No cervical adenopathy.  Neurological: Alert and oriented to person, place, and time. Exhibits normal muscle tone. Gait normal. Coordination normal.  Skin: Skin is warm and dry. No rash noted. Not diaphoretic. No erythema. No pallor.  Psychiatric: Mood, memory and judgment normal.  Vitals reviewed.  LABORATORY DATA: Lab Results  Component Value Date   WBC 2.7 (L) 04/17/2020   HGB 14.2 04/17/2020   HCT 41.8 04/17/2020   MCV 90.9 04/17/2020   PLT 251 04/17/2020      Chemistry      Component Value Date/Time   NA 140 04/17/2020 0821   K 4.3 04/17/2020 0821   CL 107 04/17/2020 0821   CO2 25 04/17/2020 0821   BUN 10  04/17/2020 0821   CREATININE 0.93 04/17/2020 0821      Component Value Date/Time   CALCIUM 9.2 04/17/2020 0821   ALKPHOS 85 04/17/2020 0821   AST 37 04/17/2020 0821   ALT 29 04/17/2020 0821   BILITOT 0.5 04/17/2020 0821       RADIOGRAPHIC STUDIES:  No results found.   ASSESSMENT/PLAN:  This is a very pleasant 44 year old Caucasian female who presented with a large mass in theleftupper/lower lobe and mediastinal adenopathy as well as a solitary brain metastasis highly suspicious for malignant melanoma.The final pathology report from [facility] malignancy was consistent with metastatic melanoma.  She is  status post a left temporal craniotomy and resection of the tumor on 04/29/2018. The patient also completed stereotactic radiotherapy to the resection cavity under the care of Dr. Lisbeth Renshaw. She previously underwent treatment with immunotherapy with Keytruda 200 mg IV every 3 weeks. She is status post 3 cycles. CT scanafter cycle #3 showed enlargement of the left upper lobe lung mass. This was suspicious for pseudo-progression on immunotherapy. The patient was started on palliative course of radiotherapy to the left upper lobe mass and she tolerated this treatment fairly well. She was seen by Dr. Gavin Potters and she recommended for the patient to resume her treatment with [facility] until she undergoes further molecular studies. The patient was treated with 2 more cycles of Keytruda and tolerated the treatment well. The patient then had a CT scan performed which showed pericardial invasion. Therefore,the patient is not a good candidate for surgical resection.  From her final pathology report and recommendations from [facility], the patient was switched to immunotherapy withipilimumaband nivolumab. She isstatus post 2 cycles.Treatment was on hold for more than 6 weeks secondary to grade 3 hepatic dysfunction. She had  improvement of her liver enzymes after a prolonged treatment with a tapering scheduleof steroids.  Sheiscurrently undergoing single agent nivolumab 240 mgIV every 2 weeks. She is statuspost an additional26cycles with nivolumab240 mg IV every 2 weeks. She has beentoleratingtreatment well  The patient was seen with Dr. Julien Nordmann todaylabs were reviewed. Recommend that she proceed with cycle #27 today as scheduled.  We will see her back for follow-up visit in 2 weeks for evaluation before starting cycle #28.  Dr. Julien Nordmann discussed with the patient her recent PET scan results from [facility].  Dr. Julien Nordmann also discussed with the patient that well with the every 4-week dosing regimen for nivolumab is available, concerning that the patient had elevated LFTs with nivolumab and ipilimumab in the past, that he would be hesitant to give her the increased dose of nivolumab every 4 weeks.  The patient was agreeable to continue on the every 2-week dosing as she has been doing.  Discussed good sleep practices with insomnia. She will continue to use unisom intermittently for insomnia.   The patient was advised to call immediately if she has any concerning symptoms in the interval. The patient voices understanding of current disease status and treatment options and is in agreement with the current care plan. All questions were answered. The patient knows to call the clinic with any problems, questions or concerns. We can certainly see the patient much sooner if necessary   Orders Placed This Encounter  Procedures  . CBC with Differential (Cancer Center Only)    Standing Status:   Standing    Number of Occurrences:   18    Standing Expiration Date:   04/17/2021  . CMP ([place] only)    Standing Status:   Standing    Number of Occurrences:   18    Standing Expiration Date:   04/17/2021     Tobe Sos Arber Wiemers, PA-C 04/17/20  ADDENDUM: Hematology/Oncology Attending: I had a face-to-face  encounter with the patient today.  I recommended her care plan.  This is a very pleasant 44 years old white female with metastatic malignant melanoma with brain metastasis as well as a large mass in the left upper lobe/suprahilar area with pericardial involvement. The patient status post surgical resection of the brain lesion in addition to stereotactic radiotherapy.  She is currently undergoing treatment with nivolumab 240 mg IV every 2  weeks status post 26 cycles after the induction phase with ipilimumab and nivolumab.  She has been tolerating this treatment well with no concerning adverse effects. She had a PET scan performed at [facility].  I personally and independently discussed the scan results with the patient today.  Her scan showed no concerning findings for disease progression and no significant hypermetabolic activity in the residual mass in the left lung.  She also has mild lymphadenopathy in the axilla but this is likely secondary to her recent Covid vaccination.  We will continue to monitor it closely. I recommended for the patient to continue her current treatment with nivolumab with the same dose every 2 weeks.  We discussed the option of changing nivolumab to every 4 weeks but the patient had significant liver dysfunction in the past and I felt it will be safer for her to stay on on the 2 weeks schedule.  She is in agreement with the current plan. She will come back for follow-up visit in 2 weeks for evaluation before the next cycle of her treatment. The patient was advised to call immediately if she has any concerning symptoms in the interval.  Disclaimer: This note was dictated with voice recognition software. Similar sounding words can inadvertently be transcribed and may be missed upon review. Leslie Kempf, MD 04/17/20

## 2020-04-17 ENCOUNTER — Encounter: Payer: Self-pay | Admitting: Physician Assistant

## 2020-04-17 ENCOUNTER — Inpatient Hospital Stay: Payer: BC Managed Care – PPO

## 2020-04-17 ENCOUNTER — Inpatient Hospital Stay: Payer: BC Managed Care – PPO | Attending: Physician Assistant | Admitting: Physician Assistant

## 2020-04-17 ENCOUNTER — Other Ambulatory Visit: Payer: Self-pay

## 2020-04-17 VITALS — Ht 69.0 in | Wt 142.4 lb

## 2020-04-17 VITALS — BP 117/76 | HR 67 | Temp 98.2°F | Resp 17 | Ht 69.0 in | Wt 141.9 lb

## 2020-04-17 DIAGNOSIS — Z79899 Other long term (current) drug therapy: Secondary | ICD-10-CM | POA: Insufficient documentation

## 2020-04-17 DIAGNOSIS — Z5112 Encounter for antineoplastic immunotherapy: Secondary | ICD-10-CM | POA: Insufficient documentation

## 2020-04-17 DIAGNOSIS — G4700 Insomnia, unspecified: Secondary | ICD-10-CM | POA: Diagnosis not present

## 2020-04-17 DIAGNOSIS — C7802 Secondary malignant neoplasm of left lung: Secondary | ICD-10-CM

## 2020-04-17 DIAGNOSIS — C439 Malignant melanoma of skin, unspecified: Secondary | ICD-10-CM | POA: Diagnosis not present

## 2020-04-17 DIAGNOSIS — C3432 Malignant neoplasm of lower lobe, left bronchus or lung: Secondary | ICD-10-CM

## 2020-04-17 DIAGNOSIS — C7931 Secondary malignant neoplasm of brain: Secondary | ICD-10-CM | POA: Diagnosis not present

## 2020-04-17 LAB — CBC WITH DIFFERENTIAL (CANCER CENTER ONLY)
Abs Immature Granulocytes: 0 10*3/uL (ref 0.00–0.07)
Basophils Absolute: 0 10*3/uL (ref 0.0–0.1)
Basophils Relative: 1 %
Eosinophils Absolute: 0.1 10*3/uL (ref 0.0–0.5)
Eosinophils Relative: 3 %
HCT: 41.8 % (ref 36.0–46.0)
Hemoglobin: 14.2 g/dL (ref 12.0–15.0)
Immature Granulocytes: 0 %
Lymphocytes Relative: 31 %
Lymphs Abs: 0.9 10*3/uL (ref 0.7–4.0)
MCH: 30.9 pg (ref 26.0–34.0)
MCHC: 34 g/dL (ref 30.0–36.0)
MCV: 90.9 fL (ref 80.0–100.0)
Monocytes Absolute: 0.5 10*3/uL (ref 0.1–1.0)
Monocytes Relative: 18 %
Neutro Abs: 1.3 10*3/uL — ABNORMAL LOW (ref 1.7–7.7)
Neutrophils Relative %: 47 %
Platelet Count: 251 10*3/uL (ref 150–400)
RBC: 4.6 MIL/uL (ref 3.87–5.11)
RDW: 12.7 % (ref 11.5–15.5)
WBC Count: 2.7 10*3/uL — ABNORMAL LOW (ref 4.0–10.5)
nRBC: 0 % (ref 0.0–0.2)

## 2020-04-17 LAB — CMP (CANCER CENTER ONLY)
ALT: 29 U/L (ref 0–44)
AST: 37 U/L (ref 15–41)
Albumin: 4.1 g/dL (ref 3.5–5.0)
Alkaline Phosphatase: 85 U/L (ref 38–126)
Anion gap: 8 (ref 5–15)
BUN: 10 mg/dL (ref 6–20)
CO2: 25 mmol/L (ref 22–32)
Calcium: 9.2 mg/dL (ref 8.9–10.3)
Chloride: 107 mmol/L (ref 98–111)
Creatinine: 0.93 mg/dL (ref 0.44–1.00)
GFR, Est AFR Am: >60 mL/min (ref >60)
GFR, Estimated: >60 mL/min (ref >60)
Glucose, Bld: 76 mg/dL (ref 70–99)
Potassium: 4.3 mmol/L (ref 3.5–5.1)
Sodium: 140 mmol/L (ref 135–145)
Total Bilirubin: 0.5 mg/dL (ref 0.3–1.2)
Total Protein: 7 g/dL (ref 6.5–8.1)

## 2020-04-17 MED ORDER — SODIUM CHLORIDE 0.9 % IV SOLN
240.0000 mg | Freq: Once | INTRAVENOUS | Status: AC
  Administered 2020-04-17: 240 mg via INTRAVENOUS
  Filled 2020-04-17: qty 24

## 2020-04-17 MED ORDER — SODIUM CHLORIDE 0.9 % IV SOLN
Freq: Once | INTRAVENOUS | Status: AC
  Filled 2020-04-17: qty 250

## 2020-04-17 NOTE — Progress Notes (Signed)
Received verbal confirmation from Dr. Julien Nordmann to treat patient today for [place] of 1.3.

## 2020-04-17 NOTE — Patient Instructions (Signed)

## 2020-04-17 NOTE — Patient Instructions (Signed)
[facility] Discharge Instructions for Patients Receiving Chemotherapy  Today you received the following chemotherapy agents Nivolumab  To help prevent nausea and vomiting after your treatment, we encourage you to take your nausea medication as directed.  If you develop nausea and vomiting that is not controlled by your nausea medication, call the clinic.   BELOW ARE SYMPTOMS THAT SHOULD BE REPORTED IMMEDIATELY:  *FEVER GREATER THAN 100.5 F  *CHILLS WITH OR WITHOUT FEVER  NAUSEA AND VOMITING THAT IS NOT CONTROLLED WITH YOUR NAUSEA MEDICATION  *UNUSUAL SHORTNESS OF BREATH  *UNUSUAL BRUISING OR BLEEDING  TENDERNESS IN MOUTH AND THROAT WITH OR WITHOUT PRESENCE OF ULCERS  *URINARY PROBLEMS  *BOWEL PROBLEMS  UNUSUAL RASH Items with * indicate a potential emergency and should be followed up as soon as possible.  Feel free to call the clinic should you have any questions or concerns. The clinic phone number is [phone].  Please show the [facility] at check-in to the Emergency Department and triage nurse.

## 2020-04-22 DIAGNOSIS — S86911A Strain of unspecified muscle(s) and tendon(s) at lower leg level, right leg, initial encounter: Secondary | ICD-10-CM | POA: Diagnosis not present

## 2020-04-22 DIAGNOSIS — M2560 Stiffness of unspecified joint, not elsewhere classified: Secondary | ICD-10-CM | POA: Diagnosis not present

## 2020-04-22 DIAGNOSIS — M9903 Segmental and somatic dysfunction of lumbar region: Secondary | ICD-10-CM | POA: Diagnosis not present

## 2020-04-23 NOTE — Progress Notes (Signed)
Pharmacist Chemotherapy Monitoring - Follow Up Assessment    I verify that I have reviewed each item in the below checklist:  . Regimen for the patient is scheduled for the appropriate day and plan matches scheduled date. Marland Kitchen Appropriate non-routine labs are ordered dependent on drug ordered. . If applicable, additional medications reviewed and ordered per protocol based on lifetime cumulative doses and/or treatment regimen.   Plan for follow-up and/or issues identified: No . I-vent associated with next due treatment: No . MD and/or nursing notified: No  Jill Stopka D 04/23/2020 4:30 PM

## 2020-04-27 NOTE — Progress Notes (Signed)
[place] OFFICE PROGRESS NOTE  System, Pcp Not In No address on file  DIAGNOSIS: Metastatic high-grade neoplasm consistent with metastatic malignant melanoma based on the most recent pathology report from [facility] in October 2019, presented with large left left upper/left lower lobe mass with a hypermetabolic AP window lymph node as well as solitary metastatic brain lesion diagnosed in May 2019.  Biomarker Findings Microsatellite status - MS-Stable Tumor Mutational Burden - TMB-Intermediate (16 Muts/Mb) Genomic Findings For a complete list of the genes assayed, please refer to the Appendix. CD274 (PD-L1) amplification NRAS Q61R PDCD1LG2 (PD-L2) amplification MYC amplification EPHB1 amplification JAK2 amplification RB1 Q93*, W808* TERT promoter -146C>T TP53 [id number]  PRIOR THERAPY:  1) left temporal craniotomy with resection of tumor with intraoperative stereotactic guidance for volumetric resection under the care of Dr. Annette Stable on 04/29/2018. 2) First-line treatment with immunotherapy with Keytruda 200 mg IV every 3 weeks. First dose June 02, 2018. Status post 3 cycles. 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 3) palliative radiotherapy to the left lung mass under the care of Dr. Lisbeth Renshaw completed on 08/24/2018. 4) Resuming her treatment again with Keytruda 200 mg IV every 3 weeks, first dose 09/08/2018. Status post 2 cycles  CURRENT THERAPY: Treatment with immunotherapy with ipilimumab 3 mg/KG and nivolumab 1 mg/KG every 3 weeks for the first 4 cycles followed by maintenance nivolumab 240 mg IV every 2 weeks. First dose of this treatment October 26, 2018. Status post 27cycles. Ipilimumab was discontinued after cycle #2 for the significant liver dysfunction. Starting from cycle #3 the patient is on treatment with single agent nivolumab.  INTERVAL HISTORY: Leslie [name] 44 y.o. female returns to the clinic  today for a follow-up visit.  The patient is feeling well today without any concerning complaints except for mild itching without rash in her axilla for which she has [place] cream for.    Otherwise, she has been tolerating her treatment with immunotherapy with nivolumab well without any adverse side effects except for mild itching.  She is getting her routine skin checks performed by her dermatologist every 6 months.  She denies any fever, chills, night sweats, or weight loss.  She denies any chest pain, shortness of breath, cough, or hemoptysis.  She denies any nausea, vomiting, diarrhea, or constipation.  She denies any headache or visual changes.  She is here today for evaluation before starting cycle #28 of nivolumab.    MEDICAL HISTORY: Past Medical History:  Diagnosis Date  . 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 Migraines   . Yeast infection     ALLERGIES:  is allergic to diflucan [fluconazole].  MEDICATIONS:  Current Outpatient Medications  Medication Sig Dispense Refill  . AMERICAN GINSENG PO Take 1,500-3,000 mg by mouth daily. Depending on level of fatigue    . Amino Acids (L-CARNITINE) LIQD Take 2,000 mg by mouth daily. Liquid L-Carnitine 2000 mg    . Ascorbic Acid (LIQUID C 500) 500 MG/15ML LIQD Take 1,000 mg by mouth daily.    . Beta Glucan POWD 1 Dose by Does not apply route daily. Beta D Glucan 400 mg    . Calcium-Magnesium (CAL-MAG PO) Take 15 mLs by mouth daily. Marine Based Cal - Mag (500 mg Calcium / 250 mg Mag) 15 ml total    . Coenzyme Q10 (COQ-10) 100 MG CAPS Take 1 tablet by mouth daily.    Marland Kitchen CREATINE MONOHYDRATE PO Take 6 g by mouth daily.  Creatine MonoHydrate 6 g (on training cycle)    . doxylamine, Sleep, (UNISOM) 25 MG tablet Take 12.5 mg by mouth at bedtime as needed for sleep.    . ferrous sulfate 324 MG TBEC Take 324 mg by mouth 2 (two) times daily.    . Glucosamine-Chondroit-Vit C-Mn (GLUCOSAMINE 1500 COMPLEX PO) Take 1 tablet by  mouth daily. Vegan CMO Glucosamine    . Hyaluronic Acid-Vitamin C (HYALURONIC ACID PO) Take 100 mg by mouth daily. Liquid Hyaluronic Acid 100 mg    . Iron-Folic Acid-Vit V25 (IRON FORMULA PO) Take 1 Dose by mouth daily. Iron 50 mg (1/2 before workout and 1/2 afterwards - cycle 1 wk off and on)    . Lysine POWD 1,600 mg by Does not apply route daily. L-Lysine Powder 1600 mg    . Methylsulfonylmethane (MSM) 1000 MG CAPS Take 4 capsules by mouth daily. 4000 mg (1/2 before workout and 1/2 afterwards)    . Multiple Vitamin (MULTIVITAMIN) tablet Take 1 tablet by mouth daily.    . Multiple Vitamins-Minerals (MY-VITALIFE) CAPS Take by mouth.    . Multiple Vitamins-Minerals (ZINC PO) Take 1 tablet by mouth daily. Raw Zinc 30 mg (cycle every other week)    . nivolumab (OPDIVO) 40 MG/4ML SOLN chemo injection Opdivo 40 mg/4 mL intravenous solution  INFUSE 40 MG OVER 30 MINUTE(S) BY INTRAVENOUS ROUTE EVERY 2 WEEKS    . NON FORMULARY Take 1 Dose by mouth daily. Cordyceps 750 mg    . NON FORMULARY Take 1 Dose by mouth daily. Liver Herbal Supplement 675 mg    . NON FORMULARY Take 1 Dose by mouth daily. Reishi Mushroom 500 mg    . NON FORMULARY Take 1 Dose by mouth daily. Curcumin Phytosome (Bioperrine) 3000 mg    . NON FORMULARY Take 1 tablet by mouth daily. Artemisnin 200 mg    . NON FORMULARY Take 1 tablet by mouth daily. Wellness Herbal Tablet as needed (Elderberry, Ashwagandha, etc)    . NON FORMULARY Take 1 tablet by mouth daily. Zinc L Carnosine Complex 75/59 mg (after workout)    . Omega 3-6-9 Fatty Acids (OMEGA-3-6-9 PO) Take 2 capsules by mouth daily. Plant Based Omega 3-6-9 2 capsules = 1825 mg    . OVER THE COUNTER MEDICATION TK 5 MILLILITERS PO QID PRF MOUTH PAIN    . QUERCETIN PO Take 1 tablet by mouth daily. Quercetin 400 mg / Bromelain 85 mg    . RESVERATROL PO Take 1,200 mg by mouth daily. Trans Resveratrol    . SM Omega-3-6-9 Fatty Acids CAPS Take by mouth.    Marland Kitchen VITAMIN A PO Take 1 tablet by  mouth daily. 1500 mcg (5000 IU)    . Vitamin D-Vitamin K (VITAMIN K2-VITAMIN D3 PO) Take 1 capsule by mouth daily. 5000 IU + 50 mcg    . vitamin E 180 MG (400 UNITS) capsule Take by mouth.    Marland Kitchen VITAMIN E PO Take 1 tablet by mouth daily. Vegan Vitamin E     No current facility-administered medications for this visit.    SURGICAL HISTORY:  Past Surgical History:  Procedure Laterality Date  . APPLICATION OF CRANIAL NAVIGATION Left 04/29/2018   Procedure: APPLICATION OF CRANIAL NAVIGATION;  Surgeon: Earnie Larsson, MD;  Location: [place];  Service: Neurosurgery;  Laterality: Left;  . CRANIOTOMY Left 04/29/2018   Procedure: LEFT CRANIOTOMY FOR  TUMOR BRAIN LAB;  Surgeon: Earnie Larsson, MD;  Location: [place];  Service: Neurosurgery;  Laterality: Left;  REVIEW OF SYSTEMS:   Review of Systems  Constitutional: Negative for appetite change, chills, fatigue, fever and unexpected weight change.  HENT: Negative for mouth sores, nosebleeds, sore throat and trouble swallowing.   Eyes: Negative for eye problems and icterus.  Respiratory: Negative for cough, hemoptysis, shortness of breath and wheezing.   Cardiovascular: Negative for chest pain and leg swelling.  Gastrointestinal: Negative for abdominal pain, constipation, diarrhea, nausea and vomiting.  Genitourinary: Negative for bladder incontinence, difficulty urinating, dysuria, frequency and hematuria.   Musculoskeletal: Negative for back pain, gait problem, neck pain and neck stiffness.  Skin: Positive for occasional itching without rash. Neurological: Negative for dizziness, extremity weakness, gait problem, headaches, light-headedness and seizures.  Hematological: Negative for adenopathy. Does not bruise/bleed easily.  Psychiatric/Behavioral: Negative for confusion, depression and sleep disturbance. The patient is not nervous/anxious.     PHYSICAL EXAMINATION:  unknown if currently breastfeeding.  ECOG PERFORMANCE STATUS: 1 - Symptomatic but  completely ambulatory  Physical Exam  Constitutional: Oriented to person, place, and time and well-developed, well-nourished, and in no distress.  HENT:  Head: Normocephalic and atraumatic.  Mouth/Throat: Oropharynx is clear and moist. No oropharyngeal exudate.  Eyes: Conjunctivae are normal. Right eye exhibits no discharge. Left eye exhibits no discharge. No scleral icterus.  Neck: Normal range of motion. Neck supple.  Cardiovascular: Normal rate, regular rhythm, normal heart sounds and intact distal pulses.   Pulmonary/Chest: Effort normal and breath sounds normal. No respiratory distress. No wheezes. No rales.  Abdominal: Soft. Bowel sounds are normal. Exhibits no distension and no mass. There is no tenderness.  Musculoskeletal: Normal range of motion. Exhibits no edema.  Lymphadenopathy:    No cervical adenopathy.  Neurological: Alert and oriented to person, place, and time. Exhibits normal muscle tone. Gait normal. Coordination normal.  Skin: Skin is warm and dry. No rash noted. Not diaphoretic. No erythema. No pallor.  Psychiatric: Mood, memory and judgment normal.  Vitals reviewed.  LABORATORY DATA: Lab Results  Component Value Date   WBC 3.7 (L) 04/29/2020   HGB 14.5 04/29/2020   HCT 43.2 04/29/2020   MCV 94.1 04/29/2020   PLT 305 04/29/2020      Chemistry      Component Value Date/Time   NA 139 04/29/2020 0815   K 4.2 04/29/2020 0815   CL 108 04/29/2020 0815   CO2 21 (L) 04/29/2020 0815   BUN 9 04/29/2020 0815   CREATININE 0.90 04/29/2020 0815      Component Value Date/Time   CALCIUM 9.1 04/29/2020 0815   ALKPHOS 96 04/29/2020 0815   AST 40 04/29/2020 0815   ALT 29 04/29/2020 0815   BILITOT 0.3 04/29/2020 0815       RADIOGRAPHIC STUDIES:  No results found.   ASSESSMENT/PLAN:  This is a very pleasant 44 year old Caucasian female who presented with a large mass in theleftupper/lower lobe and mediastinal adenopathy as well as a solitary brain metastasis  highly suspicious for malignant melanoma.The final pathology report from [facility] malignancy was consistent with metastatic melanoma.  She is status post a left temporal craniotomy and resection of the tumor on 04/29/2018. The patient also completed stereotactic radiotherapy to the resection cavity under the care of Dr. Lisbeth Renshaw. She previously underwent treatment with immunotherapy with Keytruda 200 mg IV every 3 weeks. She is status post 3 cycles. CT scanafter cycle #3 showed enlargement of the left upper lobe lung mass. This was suspicious for pseudo-progression on immunotherapy. The patient was started on palliative course of radiotherapy  to the left upper lobe mass and she tolerated this treatment fairly well. She was seen by Dr. Gavin Potters and she recommended for the patient to resume her treatment with [facility] until she undergoes further molecular studies. The patient was treated with 2 more cycles of Keytruda and tolerated the treatment well. The patient then had a CT scan performed which showed pericardial invasion. Therefore,the patient is not a good candidate for surgical resection.  From her final pathology report and recommendations from [facility], the patient was switched to immunotherapy withipilimumaband nivolumab. She isstatus post 2 cycles.Treatment was on hold for more than 6 weeks secondary to grade 3 hepatic dysfunction. She had improvement of her liver enzymes after a prolonged treatment with a tapering scheduleof steroids.  Sheiscurrently undergoing single agent nivolumab 240 mgIV every 2 weeks. She is statuspost an additional27cycles with nivolumab240 mg IV every 2 weeks. She has beentoleratingtreatment well  Labs were reviewed. Recommend that she proceed with cycle #28 today as scheduled.   We will see her back for follow-up visit in 2 weeks for evaluation before starting cycle #29.  The  patient was advised to call immediately if she has any concerning symptoms in the interval. The patient voices understanding of current disease status and treatment options and is in agreement with the current care plan. All questions were answered. The patient knows to call the clinic with any problems, questions or concerns. We can certainly see the patient much sooner if necessary   No orders of the defined types were placed in this encounter.    Ranson Belluomini L Katerra Ingman, PA-C 04/29/20

## 2020-04-29 ENCOUNTER — Inpatient Hospital Stay: Payer: BC Managed Care – PPO

## 2020-04-29 ENCOUNTER — Inpatient Hospital Stay (HOSPITAL_BASED_OUTPATIENT_CLINIC_OR_DEPARTMENT_OTHER): Payer: BC Managed Care – PPO | Admitting: Physician Assistant

## 2020-04-29 ENCOUNTER — Other Ambulatory Visit: Payer: Self-pay

## 2020-04-29 VITALS — BP 108/67 | Wt 146.7 lb

## 2020-04-29 VITALS — BP 106/72 | HR 62 | Temp 97.8°F | Resp 17

## 2020-04-29 DIAGNOSIS — C7802 Secondary malignant neoplasm of left lung: Secondary | ICD-10-CM

## 2020-04-29 DIAGNOSIS — C7931 Secondary malignant neoplasm of brain: Secondary | ICD-10-CM | POA: Diagnosis not present

## 2020-04-29 DIAGNOSIS — Z79899 Other long term (current) drug therapy: Secondary | ICD-10-CM | POA: Diagnosis not present

## 2020-04-29 DIAGNOSIS — G4700 Insomnia, unspecified: Secondary | ICD-10-CM | POA: Diagnosis not present

## 2020-04-29 DIAGNOSIS — Z5112 Encounter for antineoplastic immunotherapy: Secondary | ICD-10-CM | POA: Diagnosis not present

## 2020-04-29 DIAGNOSIS — C439 Malignant melanoma of skin, unspecified: Secondary | ICD-10-CM | POA: Diagnosis not present

## 2020-04-29 LAB — CBC WITH DIFFERENTIAL (CANCER CENTER ONLY)
Abs Immature Granulocytes: 0.01 10*3/uL (ref 0.00–0.07)
Basophils Absolute: 0 10*3/uL (ref 0.0–0.1)
Basophils Relative: 1 %
Eosinophils Absolute: 0.1 10*3/uL (ref 0.0–0.5)
Eosinophils Relative: 3 %
HCT: 43.2 % (ref 36.0–46.0)
Hemoglobin: 14.5 g/dL (ref 12.0–15.0)
Immature Granulocytes: 0 %
Lymphocytes Relative: 22 %
Lymphs Abs: 0.8 10*3/uL (ref 0.7–4.0)
MCH: 31.6 pg (ref 26.0–34.0)
MCHC: 33.6 g/dL (ref 30.0–36.0)
MCV: 94.1 fL (ref 80.0–100.0)
Monocytes Absolute: 0.6 10*3/uL (ref 0.1–1.0)
Monocytes Relative: 15 %
Neutro Abs: 2.2 10*3/uL (ref 1.7–7.7)
Neutrophils Relative %: 59 %
Platelet Count: 305 10*3/uL (ref 150–400)
RBC: 4.59 MIL/uL (ref 3.87–5.11)
RDW: 13.2 % (ref 11.5–15.5)
WBC Count: 3.7 10*3/uL — ABNORMAL LOW (ref 4.0–10.5)
nRBC: 0 % (ref 0.0–0.2)

## 2020-04-29 LAB — CMP (CANCER CENTER ONLY)
ALT: 29 U/L (ref 0–44)
AST: 40 U/L (ref 15–41)
Albumin: 4 g/dL (ref 3.5–5.0)
Alkaline Phosphatase: 96 U/L (ref 38–126)
Anion gap: 10 (ref 5–15)
BUN: 9 mg/dL (ref 6–20)
CO2: 21 mmol/L — ABNORMAL LOW (ref 22–32)
Calcium: 9.1 mg/dL (ref 8.9–10.3)
Chloride: 108 mmol/L (ref 98–111)
Creatinine: 0.9 mg/dL (ref 0.44–1.00)
GFR, Est AFR Am: >60 mL/min (ref >60)
GFR, Estimated: >60 mL/min (ref >60)
Glucose, Bld: 65 mg/dL — ABNORMAL LOW (ref 70–99)
Potassium: 4.2 mmol/L (ref 3.5–5.1)
Sodium: 139 mmol/L (ref 135–145)
Total Bilirubin: 0.3 mg/dL (ref 0.3–1.2)
Total Protein: 6.9 g/dL (ref 6.5–8.1)

## 2020-04-29 LAB — TSH: TSH: 2.773 u[IU]/mL (ref 0.308–3.960)

## 2020-04-29 MED ORDER — SODIUM CHLORIDE 0.9 % IV SOLN
240.0000 mg | Freq: Once | INTRAVENOUS | Status: AC
  Administered 2020-04-29: 240 mg via INTRAVENOUS
  Filled 2020-04-29: qty 24

## 2020-04-29 MED ORDER — SODIUM CHLORIDE 0.9 % IV SOLN
Freq: Once | INTRAVENOUS | Status: AC
  Filled 2020-04-29: qty 250

## 2020-04-29 NOTE — Patient Instructions (Signed)
[place] Discharge Instructions for Patients Receiving Chemotherapy  Today you received the following chemotherapy agents Nivolumab  To help prevent nausea and vomiting after your treatment, we encourage you to take your nausea medication as directed.  If you develop nausea and vomiting that is not controlled by your nausea medication, call the clinic.   BELOW ARE SYMPTOMS THAT SHOULD BE REPORTED IMMEDIATELY:  *FEVER GREATER THAN 100.5 F  *CHILLS WITH OR WITHOUT FEVER  NAUSEA AND VOMITING THAT IS NOT CONTROLLED WITH YOUR NAUSEA MEDICATION  *UNUSUAL SHORTNESS OF BREATH  *UNUSUAL BRUISING OR BLEEDING  TENDERNESS IN MOUTH AND THROAT WITH OR WITHOUT PRESENCE OF ULCERS  *URINARY PROBLEMS  *BOWEL PROBLEMS  UNUSUAL RASH Items with * indicate a potential emergency and should be followed up as soon as possible.  Feel free to call the clinic should you have any questions or concerns. The clinic phone number is [phone].  Please show the [place] at check-in to the Emergency Department and triage nurse.

## 2020-04-30 ENCOUNTER — Telehealth: Payer: Self-pay | Admitting: Medical Oncology

## 2020-04-30 NOTE — Telephone Encounter (Signed)
Per Dr Julien Nordmann I told pt she can physically  return to work .

## 2020-04-30 NOTE — Telephone Encounter (Signed)
Return to work - Please advise on when can she physically return to her work. (not at all , 1/2 day , few days a week)? 6/4-MRI brain 7/23-PET

## 2020-04-30 NOTE — Telephone Encounter (Signed)
Yes

## 2020-05-11 NOTE — Progress Notes (Signed)
[facility] OFFICE PROGRESS NOTE  System, Pcp Not In No address on file  DIAGNOSIS: Metastatic high-grade neoplasm consistent with metastatic malignant melanoma based on the most recent pathology report from [facility] in October 2019, presented with large left left upper/left lower lobe mass with a hypermetabolic AP window lymph node as well as solitary metastatic brain lesion diagnosed in May 2019.  Biomarker Findings Microsatellite status - MS-Stable Tumor Mutational Burden - TMB-Intermediate (16 Muts/Mb) Genomic Findings For a complete list of the genes assayed, please refer to the Appendix. CD274 (PD-L1) amplification NRAS Q61R PDCD1LG2 (PD-L2) amplification MYC amplification EPHB1 amplification JAK2 amplification RB1 Q93*, B017* TERT promoter -146C>T TP53 C275W  PRIOR THERAPY:  1) left temporal craniotomy with resection of tumor with intraoperative stereotactic guidance for volumetric resection under the care of Dr. Annette Stable on 04/29/2018. 2) First-line treatment with immunotherapy with Keytruda 200 mg IV every 3 weeks. First dose June 02, 2018. Status post 3 cycles. 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 3) palliative radiotherapy to the left lung mass under the care of Dr. Lisbeth Renshaw completed on 08/24/2018. 4) Resuming her treatment again with Keytruda 200 mg IV every 3 weeks, first dose 09/08/2018. Status post 2 cycles  CURRENT THERAPY: Treatment with immunotherapy with ipilimumab 3 mg/KG and nivolumab 1 mg/KG every 3 weeks for the first 4 cycles followed by maintenance nivolumab 240 mg IV every 2 weeks. First dose of this treatment October 26, 2018. Status post28cycles. Ipilimumab was discontinued after cycle #2 for the significant liver dysfunction. Starting from cycle #3 the patient is on treatment with single agent nivolumab.  INTERVAL HISTORY: Leslie [name] 44 y.o. female returns to the clinic  today forafollow-up visit. The patient is feeling well today without any concerning complaints except for mild itching without rash in her axilla for which she has [place] cream for.  She also uses saffron and Gaba for her insomnia.   Otherwise, she has been tolerating her treatment with immunotherapy with nivolumab well without any adverse side effects except for mild itching. She is getting her routine skin checks performed by her dermatologist every 6 months. She denies any fever, chills, night sweats, or weight loss. She denies any chest pain, shortness of breath, cough, or hemoptysis. She denies any nausea, vomiting, diarrhea, or constipation. She denies any headache or visual changes. She is scheduled for her next brain MRI later this week on 6/4 due to her history of metastatic disease to the brain. She is scheduled for her next PET scan at [facility] 7/23. She is here today for evaluation before starting cycle #29 of nivolumab.  MEDICAL HISTORY: Past Medical History:  Diagnosis Date  . 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 Migraines   . Yeast infection     ALLERGIES:  is allergic to diflucan [fluconazole].  MEDICATIONS:  Current Outpatient Medications  Medication Sig Dispense Refill  . AMERICAN GINSENG PO Take 1,500-3,000 mg by mouth daily. Depending on level of fatigue    . Amino Acids (L-CARNITINE) LIQD Take 2,000 mg by mouth daily. Liquid L-Carnitine 2000 mg    . Ascorbic Acid (LIQUID C 500) 500 MG/15ML LIQD Take 1,000 mg by mouth daily.    . Beta Glucan POWD 1 Dose by Does not apply route daily. Beta D Glucan 400 mg    . Calcium-Magnesium (CAL-MAG PO) Take 15 mLs by mouth daily. Marine Based Cal - Mag (500 mg Calcium / 250 mg Mag) 15  ml total    . Coenzyme Q10 (COQ-10) 100 MG CAPS Take 1 tablet by mouth daily.    Marland Kitchen CREATINE MONOHYDRATE PO Take 6 g by mouth daily. Creatine MonoHydrate 6 g (on training cycle)    . doxylamine, Sleep, (UNISOM) 25 MG  tablet Take 12.5 mg by mouth at bedtime as needed for sleep.    . ferrous sulfate 324 MG TBEC Take 324 mg by mouth 2 (two) times daily.    . Glucosamine-Chondroit-Vit C-Mn (GLUCOSAMINE 1500 COMPLEX PO) Take 1 tablet by mouth daily. Vegan CMO Glucosamine    . Hyaluronic Acid-Vitamin C (HYALURONIC ACID PO) Take 100 mg by mouth daily. Liquid Hyaluronic Acid 100 mg    . Iron-Folic Acid-Vit Y07 (IRON FORMULA PO) Take 1 Dose by mouth daily. Iron 50 mg (1/2 before workout and 1/2 afterwards - cycle 1 wk off and on)    . Lysine POWD 1,600 mg by Does not apply route daily. L-Lysine Powder 1600 mg    . Methylsulfonylmethane (MSM) 1000 MG CAPS Take 4 capsules by mouth daily. 4000 mg (1/2 before workout and 1/2 afterwards)    . Multiple Vitamin (MULTIVITAMIN) tablet Take 1 tablet by mouth daily.    . Multiple Vitamins-Minerals (MY-VITALIFE) CAPS Take by mouth.    . Multiple Vitamins-Minerals (ZINC PO) Take 1 tablet by mouth daily. Raw Zinc 30 mg (cycle every other week)    . nivolumab (OPDIVO) 40 MG/4ML SOLN chemo injection Opdivo 40 mg/4 mL intravenous solution  INFUSE 40 MG OVER 30 MINUTE(S) BY INTRAVENOUS ROUTE EVERY 2 WEEKS    . NON FORMULARY Take 1 Dose by mouth daily. Cordyceps 750 mg    . NON FORMULARY Take 1 Dose by mouth daily. Liver Herbal Supplement 675 mg    . NON FORMULARY Take 1 Dose by mouth daily. Reishi Mushroom 500 mg    . NON FORMULARY Take 1 Dose by mouth daily. Curcumin Phytosome (Bioperrine) 3000 mg    . NON FORMULARY Take 1 tablet by mouth daily. Artemisnin 200 mg    . NON FORMULARY Take 1 tablet by mouth daily. Wellness Herbal Tablet as needed (Elderberry, Ashwagandha, etc)    . NON FORMULARY Take 1 tablet by mouth daily. Zinc L Carnosine Complex 75/59 mg (after workout)    . Omega 3-6-9 Fatty Acids (OMEGA-3-6-9 PO) Take 2 capsules by mouth daily. Plant Based Omega 3-6-9 2 capsules = 1825 mg    . OVER THE COUNTER MEDICATION TK 5 MILLILITERS PO QID PRF MOUTH PAIN    . QUERCETIN PO  Take 1 tablet by mouth daily. Quercetin 400 mg / Bromelain 85 mg    . RESVERATROL PO Take 1,200 mg by mouth daily. Trans Resveratrol    . SM Omega-3-6-9 Fatty Acids CAPS Take by mouth.    Marland Kitchen VITAMIN A PO Take 1 tablet by mouth daily. 1500 mcg (5000 IU)    . Vitamin D-Vitamin K (VITAMIN K2-VITAMIN D3 PO) Take 1 capsule by mouth daily. 5000 IU + 50 mcg    . vitamin E 180 MG (400 UNITS) capsule Take by mouth.    Marland Kitchen VITAMIN E PO Take 1 tablet by mouth daily. Vegan Vitamin E     No current facility-administered medications for this visit.    SURGICAL HISTORY:  Past Surgical History:  Procedure Laterality Date  . APPLICATION OF CRANIAL NAVIGATION Left 04/29/2018   Procedure: APPLICATION OF CRANIAL NAVIGATION;  Surgeon: Earnie Larsson, MD;  Location: [facility];  Service: Neurosurgery;  Laterality: Left;  .  CRANIOTOMY Left 04/29/2018   Procedure: LEFT CRANIOTOMY FOR  TUMOR BRAIN LAB;  Surgeon: Earnie Larsson, MD;  Location: [facility];  Service: Neurosurgery;  Laterality: Left;    REVIEW OF SYSTEMS:   Review of Systems  Constitutional: Negative for appetite change, chills, fatigue, fever and unexpected weight change.  HENT: Negative for mouth sores, nosebleeds, sore throat and trouble swallowing.   Eyes: Negative for eye problems and icterus.  Respiratory: Negative for cough, hemoptysis, shortness of breath and wheezing.   Cardiovascular: Negative for chest pain and leg swelling.  Gastrointestinal: Negative for abdominal pain, constipation, diarrhea, nausea and vomiting.  Genitourinary: Negative for bladder incontinence, difficulty urinating, dysuria, frequency and hematuria.   Musculoskeletal: Negative for back pain, gait problem, neck pain and neck stiffness.  Skin: Positive for occasional itching without rash. Neurological: Negative for dizziness, extremity weakness, gait problem, headaches, light-headedness and seizures.  Hematological: Negative for adenopathy. Does not bruise/bleed easily.   Psychiatric/Behavioral: Negative for confusion, depression and sleep disturbance. The patient is not nervous/anxious.     PHYSICAL EXAMINATION:  Blood pressure 105/64, pulse (!) 59, temperature 97.7 F (36.5 C), temperature source Temporal, resp. rate 18, height 5' 9"  (1.753 m), weight 145 lb 1.6 oz (65.8 kg), SpO2 100 %, unknown if currently breastfeeding.  ECOG PERFORMANCE STATUS: 1 - Symptomatic but completely ambulatory  Physical Exam  Constitutional: Oriented to person, place, and time and well-developed, well-nourished, and in no distress.  HENT:  Head: Normocephalic and atraumatic.  Mouth/Throat: Oropharynx is clear and moist. No oropharyngeal exudate.  Eyes: Conjunctivae are normal. Right eye exhibits no discharge. Left eye exhibits no discharge. No scleral icterus.  Neck: Normal range of motion. Neck supple.  Cardiovascular: Normal rate, regular rhythm, normal heart sounds and intact distal pulses.   Pulmonary/Chest: Effort normal and breath sounds normal. No respiratory distress. No wheezes. No rales.  Abdominal: Soft. Bowel sounds are normal. Exhibits no distension and no mass. There is no tenderness.  Musculoskeletal: Normal range of motion. Exhibits no edema.  Lymphadenopathy:    No cervical adenopathy.  Neurological: Alert and oriented to person, place, and time. Exhibits normal muscle tone. Gait normal. Coordination normal.  Skin: Skin is warm and dry. No rash noted. Not diaphoretic. No erythema. No pallor.  Psychiatric: Mood, memory and judgment normal.  Vitals reviewed.  LABORATORY DATA: Lab Results  Component Value Date   WBC 4.1 05/14/2020   HGB 13.5 05/14/2020   HCT 40.4 05/14/2020   MCV 93.5 05/14/2020   PLT 234 05/14/2020      Chemistry      Component Value Date/Time   NA 139 04/29/2020 0815   K 4.2 04/29/2020 0815   CL 108 04/29/2020 0815   CO2 21 (L) 04/29/2020 0815   BUN 9 04/29/2020 0815   CREATININE 0.90 04/29/2020 0815      Component Value  Date/Time   CALCIUM 9.1 04/29/2020 0815   ALKPHOS 96 04/29/2020 0815   AST 40 04/29/2020 0815   ALT 29 04/29/2020 0815   BILITOT 0.3 04/29/2020 0815       RADIOGRAPHIC STUDIES:  No results found.   ASSESSMENT/PLAN:  This is a very pleasant 44 year old Caucasian female who presented with a large mass in theleftupper/lower lobe and mediastinal adenopathy as well as a solitary brain metastasis highly suspicious for malignant melanoma.The final pathology report from [facility] malignancy was consistent with metastatic melanoma.  She is status post a left temporal craniotomy and resection of the tumor on 04/29/2018. The patient also  completed stereotactic radiotherapy to the resection cavity under the care of Dr. Lisbeth Renshaw. She previously underwent treatment with immunotherapy with Keytruda 200 mg IV every 3 weeks. She is status post 3 cycles. CT scanafter cycle #3 showed enlargement of the left upper lobe lung mass. This was suspicious for pseudo-progression on immunotherapy. The patient was started on palliative course of radiotherapy to the left upper lobe mass and she tolerated this treatment fairly well. She was seen by Dr. Gavin Potters and she recommended for the patient to resume her treatment with [facility] until she undergoes further molecular studies. The patient was treated with 2 more cycles of Keytruda and tolerated the treatment well. The patient then had a CT scan performed which showed pericardial invasion. Therefore,the patient is not a good candidate for surgical resection.  From her final pathology report and recommendations from [facility], the patient was switched to immunotherapy withipilimumaband nivolumab. She isstatus post 2 cycles.Treatment was on hold for more than 6 weeks secondary to grade 3 hepatic dysfunction. She had improvement of her liver enzymes after a prolonged treatment with a tapering  scheduleof steroids.  Sheiscurrently undergoing single agent nivolumab 240 mgIV every 2 weeks. She is statuspost an additional28cycles with nivolumab240 mg IV every 2 weeks. She has beentoleratingtreatment well  Labs were reviewed. Recommend that she proceed with cycle #29 today as scheduled.   We will see her back for follow-up visit in 2 weeks for evaluation before starting cycle #30.  She will get her brain MRI later this week at [facility] as scheduled.   Discussed with the patient I would be happy to send in a prescription for atarax if she is interested for her itching if her other remedies are not adequately managing her symptoms. She will let me know.   The patient was advised to call immediately if she has any concerning symptoms in the interval. The patient voices understanding of current disease status and treatment options and is in agreement with the current care plan. All questions were answered. The patient knows to call the clinic with any problems, questions or concerns. We can certainly see the patient much sooner if necessary   No orders of the defined types were placed in this encounter.    Lanny Lipkin L Westyn Keatley, PA-C 05/14/20

## 2020-05-14 ENCOUNTER — Inpatient Hospital Stay: Payer: BC Managed Care – PPO | Attending: Physician Assistant

## 2020-05-14 ENCOUNTER — Other Ambulatory Visit: Payer: Self-pay

## 2020-05-14 ENCOUNTER — Inpatient Hospital Stay (HOSPITAL_BASED_OUTPATIENT_CLINIC_OR_DEPARTMENT_OTHER): Payer: BC Managed Care – PPO | Admitting: Physician Assistant

## 2020-05-14 ENCOUNTER — Inpatient Hospital Stay: Payer: BC Managed Care – PPO

## 2020-05-14 VITALS — BP 105/64 | HR 59 | Temp 97.7°F | Resp 18 | Ht 69.0 in | Wt 145.1 lb

## 2020-05-14 DIAGNOSIS — G4700 Insomnia, unspecified: Secondary | ICD-10-CM | POA: Diagnosis not present

## 2020-05-14 DIAGNOSIS — C7802 Secondary malignant neoplasm of left lung: Secondary | ICD-10-CM | POA: Insufficient documentation

## 2020-05-14 DIAGNOSIS — Z79899 Other long term (current) drug therapy: Secondary | ICD-10-CM | POA: Diagnosis not present

## 2020-05-14 DIAGNOSIS — C439 Malignant melanoma of skin, unspecified: Secondary | ICD-10-CM | POA: Insufficient documentation

## 2020-05-14 DIAGNOSIS — Z5112 Encounter for antineoplastic immunotherapy: Secondary | ICD-10-CM | POA: Diagnosis not present

## 2020-05-14 LAB — CBC WITH DIFFERENTIAL (CANCER CENTER ONLY)
Abs Immature Granulocytes: 0 10*3/uL (ref 0.00–0.07)
Basophils Absolute: 0 10*3/uL (ref 0.0–0.1)
Basophils Relative: 1 %
Eosinophils Absolute: 0.1 10*3/uL (ref 0.0–0.5)
Eosinophils Relative: 3 %
HCT: 40.4 % (ref 36.0–46.0)
Hemoglobin: 13.5 g/dL (ref 12.0–15.0)
Immature Granulocytes: 0 %
Lymphocytes Relative: 25 %
Lymphs Abs: 1 10*3/uL (ref 0.7–4.0)
MCH: 31.3 pg (ref 26.0–34.0)
MCHC: 33.4 g/dL (ref 30.0–36.0)
MCV: 93.5 fL (ref 80.0–100.0)
Monocytes Absolute: 0.5 10*3/uL (ref 0.1–1.0)
Monocytes Relative: 13 %
Neutro Abs: 2.4 10*3/uL (ref 1.7–7.7)
Neutrophils Relative %: 58 %
Platelet Count: 234 10*3/uL (ref 150–400)
RBC: 4.32 MIL/uL (ref 3.87–5.11)
RDW: 12.8 % (ref 11.5–15.5)
WBC Count: 4.1 10*3/uL (ref 4.0–10.5)
nRBC: 0 % (ref 0.0–0.2)

## 2020-05-14 LAB — CMP (CANCER CENTER ONLY)
ALT: 28 U/L (ref 0–44)
AST: 36 U/L (ref 15–41)
Albumin: 4.1 g/dL (ref 3.5–5.0)
Alkaline Phosphatase: 83 U/L (ref 38–126)
Anion gap: 9 (ref 5–15)
BUN: 11 mg/dL (ref 6–20)
CO2: 26 mmol/L (ref 22–32)
Calcium: 9.3 mg/dL (ref 8.9–10.3)
Chloride: 106 mmol/L (ref 98–111)
Creatinine: 1.03 mg/dL — ABNORMAL HIGH (ref 0.44–1.00)
GFR, Est AFR Am: >60 mL/min (ref >60)
GFR, Estimated: >60 mL/min (ref >60)
Glucose, Bld: 86 mg/dL (ref 70–99)
Potassium: 4.5 mmol/L (ref 3.5–5.1)
Sodium: 141 mmol/L (ref 135–145)
Total Bilirubin: <0.2 mg/dL — ABNORMAL LOW (ref 0.3–1.2)
Total Protein: 6.7 g/dL (ref 6.5–8.1)

## 2020-05-14 MED ORDER — SODIUM CHLORIDE 0.9 % IV SOLN
240.0000 mg | Freq: Once | INTRAVENOUS | Status: AC
  Administered 2020-05-14: 240 mg via INTRAVENOUS
  Filled 2020-05-14: qty 24

## 2020-05-14 MED ORDER — SODIUM CHLORIDE 0.9 % IV SOLN
Freq: Once | INTRAVENOUS | Status: AC
  Filled 2020-05-14: qty 250

## 2020-05-14 NOTE — Patient Instructions (Signed)
[facility] Discharge Instructions for Patients Receiving Chemotherapy  Today you received the following chemotherapy agents: Opdivo  To help prevent nausea and vomiting after your treatment, we encourage you to take your nausea medication as directed.    If you develop nausea and vomiting that is not controlled by your nausea medication, call the clinic.   BELOW ARE SYMPTOMS THAT SHOULD BE REPORTED IMMEDIATELY:  *FEVER GREATER THAN 100.5 F  *CHILLS WITH OR WITHOUT FEVER  NAUSEA AND VOMITING THAT IS NOT CONTROLLED WITH YOUR NAUSEA MEDICATION  *UNUSUAL SHORTNESS OF BREATH  *UNUSUAL BRUISING OR BLEEDING  TENDERNESS IN MOUTH AND THROAT WITH OR WITHOUT PRESENCE OF ULCERS  *URINARY PROBLEMS  *BOWEL PROBLEMS  UNUSUAL RASH Items with * indicate a potential emergency and should be followed up as soon as possible.  Feel free to call the clinic should you have any questions or concerns. The clinic phone number is [phone].  Please show the [facility] at check-in to the Emergency Department and triage nurse.

## 2020-05-15 ENCOUNTER — Inpatient Hospital Stay: Payer: BC Managed Care – PPO

## 2020-05-15 ENCOUNTER — Inpatient Hospital Stay: Payer: BC Managed Care – PPO | Admitting: Internal Medicine

## 2020-05-17 DIAGNOSIS — C439 Malignant melanoma of skin, unspecified: Secondary | ICD-10-CM | POA: Diagnosis not present

## 2020-05-17 DIAGNOSIS — Z6821 Body mass index (BMI) 21.0-21.9, adult: Secondary | ICD-10-CM | POA: Diagnosis not present

## 2020-05-17 DIAGNOSIS — Z923 Personal history of irradiation: Secondary | ICD-10-CM | POA: Diagnosis not present

## 2020-05-17 DIAGNOSIS — Z9889 Other specified postprocedural states: Secondary | ICD-10-CM | POA: Diagnosis not present

## 2020-05-17 DIAGNOSIS — C799 Secondary malignant neoplasm of unspecified site: Secondary | ICD-10-CM | POA: Diagnosis not present

## 2020-05-17 DIAGNOSIS — C7931 Secondary malignant neoplasm of brain: Secondary | ICD-10-CM | POA: Diagnosis not present

## 2020-05-17 DIAGNOSIS — C792 Secondary malignant neoplasm of skin: Secondary | ICD-10-CM | POA: Diagnosis not present

## 2020-05-26 NOTE — Progress Notes (Signed)
[facility] OFFICE PROGRESS NOTE  System, Pcp Not In No address on file  DIAGNOSIS: Metastatic high-grade neoplasm consistent with metastatic malignant melanoma based on the most recent pathology report from [facility] in October 2019, presented with large left left upper/left lower lobe mass with a hypermetabolic AP window lymph node as well as solitary metastatic brain lesion diagnosed in May 2019.  Biomarker Findings Microsatellite status - MS-Stable Tumor Mutational Burden - TMB-Intermediate (16 Muts/Mb) Genomic Findings For a complete list of the genes assayed, please refer to the Appendix. CD274 (PD-L1) amplification NRAS Q61R PDCD1LG2 (PD-L2) amplification MYC amplification EPHB1 amplification JAK2 amplification RB1 Q93*, H962* TERT promoter -146C>T TP53 C275W  PRIOR THERAPY: 1) left temporal craniotomy with resection of tumor with intraoperative stereotactic guidance for volumetric resection under the care of Dr. Annette Stable on 04/29/2018. 2) First-line treatment with immunotherapy with Keytruda 200 mg IV every 3 weeks. First dose June 02, 2018. Status post 3 cycles. 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 3) palliative radiotherapy to the left lung mass under the care of Dr. Lisbeth Renshaw completed on 08/24/2018. 4) Resuming her treatment again with Keytruda 200 mg IV every 3 weeks, first dose 09/08/2018. Status post 2 cycles  CURRENT THERAPY: Treatment with immunotherapy with ipilimumab 3 mg/KG and nivolumab 1 mg/KG every 3 weeks for the first 4 cycles followed by maintenance nivolumab 240 mg IV every 2 weeks. First dose of this treatment October 26, 2018. Status post28cycles. Ipilimumab was discontinued after cycle #2 for the significant liver dysfunction. Starting from cycle #3 the patient is on treatment with single agent nivolumab.  INTERVAL HISTORY: Leslie [name] 44 y.o. female returns to the clinic  today forafollow-up visit. The patient is feeling well today without any concerning complaints except for insomnia and difficulty staying asleep. Otherwise, she has been tolerating her treatment with immunotherapy with nivolumab well without any adverse side effects except for milditching.She is getting her routine skin checks performed by her dermatologist every 6 months. She denies any fever, chills, night sweats, or weight loss. She denies any chest pain, shortness of breath, cough, or hemoptysis. She denies any nausea, vomiting, diarrhea, or constipation. She denies any headache or visual changes. She recently had a repeat brain MRI at [facility] which did not show any concerning evidence for recurrence for brain malignancy from her melenoma. She does notice that infrequently she has difficulty with word finding and is wondering if that is a side effect of prior treatment for her brain metastasis. She is schedule for her PET scan at [facility] 7/23. She is here for evaluation before starting cycle #30.   MEDICAL HISTORY: Past Medical History:  Diagnosis Date  . 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 Migraines   . Yeast infection     ALLERGIES:  is allergic to diflucan [fluconazole].  MEDICATIONS:  Current Outpatient Medications  Medication Sig Dispense Refill  . AMERICAN GINSENG PO Take 1,500-3,000 mg by mouth daily. Depending on level of fatigue    . Amino Acids (L-CARNITINE) LIQD Take 2,000 mg by mouth daily. Liquid L-Carnitine 2000 mg    . Ascorbic Acid (LIQUID C 500) 500 MG/15ML LIQD Take 1,000 mg by mouth daily.    . Beta Glucan POWD 1 Dose by Does not apply route daily. Beta D Glucan 400 mg    . Calcium-Magnesium (CAL-MAG PO) Take 15 mLs by mouth daily. Marine Based Cal - Mag (500 mg Calcium / 250 mg Mag)  15 ml total    . Coenzyme Q10 (COQ-10) 100 MG CAPS Take 1 tablet by mouth daily.    Marland Kitchen CREATINE MONOHYDRATE PO Take 6 g by mouth daily. Creatine MonoHydrate 6 g  (on training cycle)    . doxylamine, Sleep, (UNISOM) 25 MG tablet Take 12.5 mg by mouth at bedtime as needed for sleep.    . ferrous sulfate 324 MG TBEC Take 324 mg by mouth 2 (two) times daily.    . Glucosamine-Chondroit-Vit C-Mn (GLUCOSAMINE 1500 COMPLEX PO) Take 1 tablet by mouth daily. Vegan CMO Glucosamine    . Hyaluronic Acid-Vitamin C (HYALURONIC ACID PO) Take 100 mg by mouth daily. Liquid Hyaluronic Acid 100 mg    . Iron-Folic Acid-Vit M84 (IRON FORMULA PO) Take 1 Dose by mouth daily. Iron 50 mg (1/2 before workout and 1/2 afterwards - cycle 1 wk off and on)    . Lysine POWD 1,600 mg by Does not apply route daily. L-Lysine Powder 1600 mg    . Methylsulfonylmethane (MSM) 1000 MG CAPS Take 4 capsules by mouth daily. 4000 mg (1/2 before workout and 1/2 afterwards)    . Multiple Vitamin (MULTIVITAMIN) tablet Take 1 tablet by mouth daily.    . Multiple Vitamins-Minerals (MY-VITALIFE) CAPS Take by mouth.    . Multiple Vitamins-Minerals (ZINC PO) Take 1 tablet by mouth daily. Raw Zinc 30 mg (cycle every other week)    . nivolumab (OPDIVO) 40 MG/4ML SOLN chemo injection Opdivo 40 mg/4 mL intravenous solution  INFUSE 40 MG OVER 30 MINUTE(S) BY INTRAVENOUS ROUTE EVERY 2 WEEKS    . NON FORMULARY Take 1 Dose by mouth daily. Cordyceps 750 mg    . NON FORMULARY Take 1 Dose by mouth daily. Liver Herbal Supplement 675 mg    . NON FORMULARY Take 1 Dose by mouth daily. Reishi Mushroom 500 mg    . NON FORMULARY Take 1 Dose by mouth daily. Curcumin Phytosome (Bioperrine) 3000 mg    . NON FORMULARY Take 1 tablet by mouth daily. Artemisnin 200 mg    . NON FORMULARY Take 1 tablet by mouth daily. Wellness Herbal Tablet as needed (Elderberry, Ashwagandha, etc)    . NON FORMULARY Take 1 tablet by mouth daily. Zinc L Carnosine Complex 75/59 mg (after workout)    . Omega 3-6-9 Fatty Acids (OMEGA-3-6-9 PO) Take 2 capsules by mouth daily. Plant Based Omega 3-6-9 2 capsules = 1825 mg    . OVER THE COUNTER MEDICATION  TK 5 MILLILITERS PO QID PRF MOUTH PAIN    . QUERCETIN PO Take 1 tablet by mouth daily. Quercetin 400 mg / Bromelain 85 mg    . RESVERATROL PO Take 1,200 mg by mouth daily. Trans Resveratrol    . SM Omega-3-6-9 Fatty Acids CAPS Take by mouth.    Marland Kitchen VITAMIN A PO Take 1 tablet by mouth daily. 1500 mcg (5000 IU)    . Vitamin D-Vitamin K (VITAMIN K2-VITAMIN D3 PO) Take 1 capsule by mouth daily. 5000 IU + 50 mcg    . vitamin E 180 MG (400 UNITS) capsule Take by mouth.    Marland Kitchen VITAMIN E PO Take 1 tablet by mouth daily. Vegan Vitamin E     No current facility-administered medications for this visit.    SURGICAL HISTORY:  Past Surgical History:  Procedure Laterality Date  . APPLICATION OF CRANIAL NAVIGATION Left 04/29/2018   Procedure: APPLICATION OF CRANIAL NAVIGATION;  Surgeon: Earnie Larsson, MD;  Location: [facility];  Service: Neurosurgery;  Laterality: Left;  .  CRANIOTOMY Left 04/29/2018   Procedure: LEFT CRANIOTOMY FOR  TUMOR BRAIN LAB;  Surgeon: Earnie Larsson, MD;  Location: [facility];  Service: Neurosurgery;  Laterality: Left;    REVIEW OF SYSTEMS:   Review of Systems  Constitutional: Negative for appetite change, chills, fatigue, fever and unexpected weight change.  HENT: Negative for mouth sores, nosebleeds, sore throat and trouble swallowing.  Eyes: Negative for eye problems and icterus.  Respiratory: Negative for cough, hemoptysis, shortness of breath and wheezing.  Cardiovascular: Negative for chest pain and leg swelling.  Gastrointestinal: Negative for abdominal pain, constipation, diarrhea, nausea and vomiting.  Genitourinary: Negative for bladder incontinence, difficulty urinating, dysuria, frequency and hematuria.  Musculoskeletal: Negative for back pain, gait problem, neck pain and neck stiffness.  Skin:Positive for occasional itching without rash. Neurological: Negative for dizziness, extremity weakness, gait problem, headaches, light-headedness and seizures.  Hematological: Negative for  adenopathy. Does not bruise/bleed easily.  Psychiatric/Behavioral: Positive for insomnia. Negative for confusion and depression. The patient is not nervous/anxious.     PHYSICAL EXAMINATION:  Blood pressure 121/75, pulse 66, temperature 98.1 F (36.7 C), temperature source Oral, resp. rate 18, weight 145 lb 12.8 oz (66.1 kg), SpO2 100 %, unknown if currently breastfeeding.  ECOG PERFORMANCE STATUS: 1 - Symptomatic but completely ambulatory  Physical Exam  Constitutional: Oriented to person, place, and time and well-developed, well-nourished, and in no distress.  HENT:  Head: Normocephalic and atraumatic.  Mouth/Throat: Oropharynx is clear and moist. No oropharyngeal exudate.  Eyes: Conjunctivae are normal. Right eye exhibits no discharge. Left eye exhibits no discharge. No scleral icterus.  Neck: Normal range of motion. Neck supple.  Cardiovascular: Normal rate, regular rhythm, normal heart sounds and intact distal pulses.   Pulmonary/Chest: Effort normal and breath sounds normal. No respiratory distress. No wheezes. No rales.  Abdominal: Soft. Bowel sounds are normal. Exhibits no distension and no mass. There is no tenderness.  Musculoskeletal: Normal range of motion. Exhibits no edema.  Lymphadenopathy:    No cervical adenopathy.  Neurological: Alert and oriented to person, place, and time. Exhibits normal muscle tone. Gait normal. Coordination normal.  Skin: Skin is warm and dry. No rash noted. Not diaphoretic. No erythema. No pallor.  Psychiatric: Mood, memory and judgment normal.  Vitals reviewed.  LABORATORY DATA: Lab Results  Component Value Date   WBC 3.5 (L) 05/28/2020   HGB 14.3 05/28/2020   HCT 41.7 05/28/2020   MCV 92.1 05/28/2020   PLT 269 05/28/2020      Chemistry      Component Value Date/Time   NA 142 05/28/2020 0846   K 4.7 05/28/2020 0846   CL 109 05/28/2020 0846   CO2 22 05/28/2020 0846   BUN 10 05/28/2020 0846   CREATININE 0.90 05/28/2020 0846       Component Value Date/Time   CALCIUM 9.1 05/28/2020 0846   ALKPHOS 82 05/28/2020 0846   AST 36 05/28/2020 0846   ALT 29 05/28/2020 0846   BILITOT 0.3 05/28/2020 0846       RADIOGRAPHIC STUDIES:  No results found.   ASSESSMENT/PLAN:  This is a very pleasant 44 year old Caucasian female who presented with a large mass in theleftupper/lower lobe and mediastinal adenopathy as well as a solitary brain metastasis highly suspicious for malignant melanoma.The final pathology report from [facility] malignancy was consistent with metastatic melanoma.  She is status post a left temporal craniotomy and resection of the tumor on 04/29/2018. The patient also completed stereotactic radiotherapy to the resection cavity under the  care of Dr. Lisbeth Renshaw. She previously underwent treatment with immunotherapy with Keytruda 200 mg IV every 3 weeks. She is status post 3 cycles. CT scanafter cycle #3 showed enlargement of the left upper lobe lung mass. This was suspicious for pseudo-progression on immunotherapy. The patient was started on palliative course of radiotherapy to the left upper lobe mass and she tolerated this treatment fairly well. She was seen by Dr. Gavin Potters and she recommended for the patient to resume her treatment with [facility] until she undergoes further molecular studies. The patient was treated with 2 more cycles of Keytruda and tolerated the treatment well. The patient then had a CT scan performed which showed pericardial invasion. Therefore,the patient is not a good candidate for surgical resection.  From her final pathology report and recommendations from [name], the patient was switched to immunotherapy withipilimumaband nivolumab. She isstatus post 2 cycles.Treatment was on hold for more than 6 weeks secondary to grade 3 hepatic dysfunction. She had improvement of her liver enzymes after a prolonged treatment  with a tapering scheduleof steroids.  Sheiscurrently undergoing single agent nivolumab 240 mgIV every 2 weeks. She is statuspost an additional29cycles with nivolumab240 mg IV every 2 weeks. She has beentoleratingtreatment well  Labs were reviewed. Recommend that she proceed with cycle #30 today as scheduled.  We will see her back for follow-up visit in 2 weeks for evaluation before starting cycle #31.  The patient was advised to call immediately if she has any concerning symptoms in the interval. The patient voices understanding of current disease status and treatment options and is in agreement with the current care plan. All questions were answered. The patient knows to call the clinic with any problems, questions or concerns. We can certainly see the patient much sooner if necessary  No orders of the defined types were placed in this encounter.    Leslie Wooley L Redith Drach, PA-C 05/28/20

## 2020-05-28 ENCOUNTER — Inpatient Hospital Stay: Payer: BC Managed Care – PPO

## 2020-05-28 ENCOUNTER — Encounter: Payer: Self-pay | Admitting: Physician Assistant

## 2020-05-28 ENCOUNTER — Other Ambulatory Visit: Payer: Self-pay

## 2020-05-28 ENCOUNTER — Inpatient Hospital Stay (HOSPITAL_BASED_OUTPATIENT_CLINIC_OR_DEPARTMENT_OTHER): Payer: BC Managed Care – PPO | Admitting: Physician Assistant

## 2020-05-28 VITALS — BP 121/75 | HR 66 | Temp 98.1°F | Resp 18 | Wt 145.8 lb

## 2020-05-28 DIAGNOSIS — C7802 Secondary malignant neoplasm of left lung: Secondary | ICD-10-CM

## 2020-05-28 DIAGNOSIS — Z79899 Other long term (current) drug therapy: Secondary | ICD-10-CM | POA: Diagnosis not present

## 2020-05-28 DIAGNOSIS — Z5112 Encounter for antineoplastic immunotherapy: Secondary | ICD-10-CM

## 2020-05-28 DIAGNOSIS — C439 Malignant melanoma of skin, unspecified: Secondary | ICD-10-CM | POA: Diagnosis not present

## 2020-05-28 DIAGNOSIS — C7931 Secondary malignant neoplasm of brain: Secondary | ICD-10-CM

## 2020-05-28 DIAGNOSIS — G4700 Insomnia, unspecified: Secondary | ICD-10-CM | POA: Diagnosis not present

## 2020-05-28 LAB — CMP (CANCER CENTER ONLY)
ALT: 29 U/L (ref 0–44)
AST: 36 U/L (ref 15–41)
Albumin: 4 g/dL (ref 3.5–5.0)
Alkaline Phosphatase: 82 U/L (ref 38–126)
Anion gap: 11 (ref 5–15)
BUN: 10 mg/dL (ref 6–20)
CO2: 22 mmol/L (ref 22–32)
Calcium: 9.1 mg/dL (ref 8.9–10.3)
Chloride: 109 mmol/L (ref 98–111)
Creatinine: 0.9 mg/dL (ref 0.44–1.00)
GFR, Est AFR Am: >60 mL/min (ref >60)
GFR, Estimated: >60 mL/min (ref >60)
Glucose, Bld: 90 mg/dL (ref 70–99)
Potassium: 4.7 mmol/L (ref 3.5–5.1)
Sodium: 142 mmol/L (ref 135–145)
Total Bilirubin: 0.3 mg/dL (ref 0.3–1.2)
Total Protein: 6.8 g/dL (ref 6.5–8.1)

## 2020-05-28 LAB — CBC WITH DIFFERENTIAL (CANCER CENTER ONLY)
Abs Immature Granulocytes: 0.01 10*3/uL (ref 0.00–0.07)
Basophils Absolute: 0 10*3/uL (ref 0.0–0.1)
Basophils Relative: 1 %
Eosinophils Absolute: 0.1 10*3/uL (ref 0.0–0.5)
Eosinophils Relative: 2 %
HCT: 41.7 % (ref 36.0–46.0)
Hemoglobin: 14.3 g/dL (ref 12.0–15.0)
Immature Granulocytes: 0 %
Lymphocytes Relative: 25 %
Lymphs Abs: 0.9 10*3/uL (ref 0.7–4.0)
MCH: 31.6 pg (ref 26.0–34.0)
MCHC: 34.3 g/dL (ref 30.0–36.0)
MCV: 92.1 fL (ref 80.0–100.0)
Monocytes Absolute: 0.5 10*3/uL (ref 0.1–1.0)
Monocytes Relative: 15 %
Neutro Abs: 2 10*3/uL (ref 1.7–7.7)
Neutrophils Relative %: 57 %
Platelet Count: 269 10*3/uL (ref 150–400)
RBC: 4.53 MIL/uL (ref 3.87–5.11)
RDW: 12.9 % (ref 11.5–15.5)
WBC Count: 3.5 10*3/uL — ABNORMAL LOW (ref 4.0–10.5)
nRBC: 0 % (ref 0.0–0.2)

## 2020-05-28 LAB — TSH: TSH: 2.33 u[IU]/mL (ref 0.308–3.960)

## 2020-05-28 MED ORDER — SODIUM CHLORIDE 0.9 % IV SOLN
Freq: Once | INTRAVENOUS | Status: AC
  Filled 2020-05-28: qty 250

## 2020-05-28 MED ORDER — SODIUM CHLORIDE 0.9 % IV SOLN
240.0000 mg | Freq: Once | INTRAVENOUS | Status: AC
  Administered 2020-05-28: 240 mg via INTRAVENOUS
  Filled 2020-05-28: qty 24

## 2020-05-28 NOTE — Patient Instructions (Signed)
[facility] Discharge Instructions for Patients Receiving Chemotherapy  Today you received the following chemotherapy agents: Opdivo  To help prevent nausea and vomiting after your treatment, we encourage you to take your nausea medication as directed.    If you develop nausea and vomiting that is not controlled by your nausea medication, call the clinic.   BELOW ARE SYMPTOMS THAT SHOULD BE REPORTED IMMEDIATELY:  *FEVER GREATER THAN 100.5 F  *CHILLS WITH OR WITHOUT FEVER  NAUSEA AND VOMITING THAT IS NOT CONTROLLED WITH YOUR NAUSEA MEDICATION  *UNUSUAL SHORTNESS OF BREATH  *UNUSUAL BRUISING OR BLEEDING  TENDERNESS IN MOUTH AND THROAT WITH OR WITHOUT PRESENCE OF ULCERS  *URINARY PROBLEMS  *BOWEL PROBLEMS  UNUSUAL RASH Items with * indicate a potential emergency and should be followed up as soon as possible.  Feel free to call the clinic should you have any questions or concerns. The clinic phone number is [phone].  Please show the [facility] at check-in to the Emergency Department and triage nurse.

## 2020-05-29 DIAGNOSIS — F419 Anxiety disorder, unspecified: Secondary | ICD-10-CM | POA: Diagnosis not present

## 2020-06-03 DIAGNOSIS — M9903 Segmental and somatic dysfunction of lumbar region: Secondary | ICD-10-CM | POA: Diagnosis not present

## 2020-06-03 DIAGNOSIS — M2560 Stiffness of unspecified joint, not elsewhere classified: Secondary | ICD-10-CM | POA: Diagnosis not present

## 2020-06-03 DIAGNOSIS — S86911A Strain of unspecified muscle(s) and tendon(s) at lower leg level, right leg, initial encounter: Secondary | ICD-10-CM | POA: Diagnosis not present

## 2020-06-10 ENCOUNTER — Ambulatory Visit: Payer: BC Managed Care – PPO | Admitting: Physician Assistant

## 2020-06-10 ENCOUNTER — Other Ambulatory Visit: Payer: BC Managed Care – PPO

## 2020-06-10 ENCOUNTER — Ambulatory Visit: Payer: BC Managed Care – PPO

## 2020-06-13 ENCOUNTER — Ambulatory Visit
Admission: RE | Admit: 2020-06-13 | Discharge: 2020-06-13 | Disposition: A | Discharge status: Home / Self Care | Payer: Self-pay | Source: Ambulatory Visit | Attending: Internal Medicine | Admitting: Internal Medicine

## 2020-06-13 ENCOUNTER — Other Ambulatory Visit: Payer: Self-pay | Admitting: Internal Medicine

## 2020-06-13 DIAGNOSIS — C7931 Secondary malignant neoplasm of brain: Secondary | ICD-10-CM

## 2020-06-14 ENCOUNTER — Ambulatory Visit: Payer: BC Managed Care – PPO | Admitting: Internal Medicine

## 2020-06-14 NOTE — Progress Notes (Signed)
[facility] OFFICE PROGRESS NOTE  System, Pcp Not In No address on file  DIAGNOSIS: Metastatic high-grade neoplasm consistent with metastatic malignant melanoma based on the most recent pathology report from [facility] in October 2019, presented with large left left upper/left lower lobe mass with a hypermetabolic AP window lymph node as well as solitary metastatic brain lesion diagnosed in May 2019.   Biomarker Findings Microsatellite status - MS-Stable Tumor Mutational Burden - TMB-Intermediate (16 Muts/Mb) Genomic Findings For a complete list of the genes assayed, please refer to the Appendix. CD274 (PD-L1) amplification NRAS Q61R PDCD1LG2 (PD-L2) amplification MYC amplification EPHB1 amplification JAK2 amplification RB1 Q93*, Z610* TERT promoter -146C>T TP53 C275W  PRIOR THERAPY: 1) left temporal craniotomy with resection of tumor with intraoperative stereotactic guidance for volumetric resection under the care of Dr. Annette Stable on 04/29/2018. 2) First-line treatment with immunotherapy with Keytruda 200 mg IV every 3 weeks. First dose June 02, 2018. Status post 3 cycles. 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 3) palliative radiotherapy to the left lung mass under the care of Dr. Lisbeth Renshaw completed on 08/24/2018. 4) Resuming her treatment again with Keytruda 200 mg IV every 3 weeks, first dose 09/08/2018. Status post 2 cycles  CURRENT THERAPY: Treatment with immunotherapy with ipilimumab 3 mg/KG and nivolumab 1 mg/KG every 3 weeks for the first 4 cycles followed by maintenance nivolumab 240 mg IV every 2 weeks. First dose of this treatment October 26, 2018. Status post29cycles. Ipilimumab was discontinued after cycle #2 for the significant liver dysfunction. Starting from cycle #3 the patient is on treatment with single agent nivolumab.  INTERVAL HISTORY: Zayleigh Stroh 44 y.o. female returns to the  clinic today forafollow-up visit. The patient is feeling well today without any concerning complaints. She recently went to [place] for vacation. Otherwise, she has been tolerating her treatment with immunotherapy with nivolumab well without any adverse side effects except for milditching.She has a mild itching and rash on her face. She is getting her routine skin checks performed by her dermatologist every 6 months. She denies any fever, chills, night sweats, or weight loss. She denies any chest pain, shortness of breath, cough, or hemoptysis. She denies any nausea, vomiting, diarrhea, or constipation. She denies any headache or visual changes. She is schedule for her PET scan at [facility] 7/23. She is here for evaluation before starting cycle #31.    MEDICAL HISTORY: Past Medical History:  Diagnosis Date  . 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 Migraines   . Yeast infection     ALLERGIES:  is allergic to diflucan [fluconazole].  MEDICATIONS:  Current Outpatient Medications  Medication Sig Dispense Refill  . AMERICAN GINSENG PO Take 1,500-3,000 mg by mouth daily. Depending on level of fatigue    . Amino Acids (L-CARNITINE) LIQD Take 2,000 mg by mouth daily. Liquid L-Carnitine 2000 mg    . Ascorbic Acid (LIQUID C 500) 500 MG/15ML LIQD Take 1,000 mg by mouth daily.    . Beta Glucan POWD 1 Dose by Does not apply route daily. Beta D Glucan 400 mg    . Calcium-Magnesium (CAL-MAG PO) Take 15 mLs by mouth daily. Marine Based Cal - Mag (500 mg Calcium / 250 mg Mag) 15 ml total    . Coenzyme Q10 (COQ-10) 100 MG CAPS Take 1 tablet by mouth daily.    Marland Kitchen CREATINE MONOHYDRATE PO Take 6 g by mouth daily. Creatine MonoHydrate 6 g (on training cycle)    .  doxylamine, Sleep, (UNISOM) 25 MG tablet Take 12.5 mg by mouth at bedtime as needed for sleep.    . ferrous sulfate 324 MG TBEC Take 324 mg by mouth 2 (two) times daily.    . Glucosamine-Chondroit-Vit C-Mn (GLUCOSAMINE 1500  COMPLEX PO) Take 1 tablet by mouth daily. Vegan CMO Glucosamine    . Hyaluronic Acid-Vitamin C (HYALURONIC ACID PO) Take 100 mg by mouth daily. Liquid Hyaluronic Acid 100 mg    . Iron-Folic Acid-Vit A54 (IRON FORMULA PO) Take 1 Dose by mouth daily. Iron 50 mg (1/2 before workout and 1/2 afterwards - cycle 1 wk off and on)    . Lysine POWD 1,600 mg by Does not apply route daily. L-Lysine Powder 1600 mg    . Methylsulfonylmethane (MSM) 1000 MG CAPS Take 4 capsules by mouth daily. 4000 mg (1/2 before workout and 1/2 afterwards)    . Multiple Vitamin (MULTIVITAMIN) tablet Take 1 tablet by mouth daily.    . Multiple Vitamins-Minerals (MY-VITALIFE) CAPS Take by mouth.    . Multiple Vitamins-Minerals (ZINC PO) Take 1 tablet by mouth daily. Raw Zinc 30 mg (cycle every other week)    . nivolumab (OPDIVO) 40 MG/4ML SOLN chemo injection Opdivo 40 mg/4 mL intravenous solution  INFUSE 40 MG OVER 30 MINUTE(S) BY INTRAVENOUS ROUTE EVERY 2 WEEKS    . NON FORMULARY Take 1 Dose by mouth daily. Cordyceps 750 mg    . NON FORMULARY Take 1 Dose by mouth daily. Liver Herbal Supplement 675 mg    . NON FORMULARY Take 1 Dose by mouth daily. Reishi Mushroom 500 mg    . NON FORMULARY Take 1 Dose by mouth daily. Curcumin Phytosome (Bioperrine) 3000 mg    . NON FORMULARY Take 1 tablet by mouth daily. Artemisnin 200 mg    . NON FORMULARY Take 1 tablet by mouth daily. Wellness Herbal Tablet as needed (Elderberry, Ashwagandha, etc)    . NON FORMULARY Take 1 tablet by mouth daily. Zinc L Carnosine Complex 75/59 mg (after workout)    . Omega 3-6-9 Fatty Acids (OMEGA-3-6-9 PO) Take 2 capsules by mouth daily. Plant Based Omega 3-6-9 2 capsules = 1825 mg    . OVER THE COUNTER MEDICATION TK 5 MILLILITERS PO QID PRF MOUTH PAIN    . QUERCETIN PO Take 1 tablet by mouth daily. Quercetin 400 mg / Bromelain 85 mg    . RESVERATROL PO Take 1,200 mg by mouth daily. Trans Resveratrol    . SM Omega-3-6-9 Fatty Acids CAPS Take by mouth.    Marland Kitchen  VITAMIN A PO Take 1 tablet by mouth daily. 1500 mcg (5000 IU)    . Vitamin D-Vitamin K (VITAMIN K2-VITAMIN D3 PO) Take 1 capsule by mouth daily. 5000 IU + 50 mcg    . vitamin E 180 MG (400 UNITS) capsule Take by mouth.    Marland Kitchen VITAMIN E PO Take 1 tablet by mouth daily. Vegan Vitamin E     No current facility-administered medications for this visit.    SURGICAL HISTORY:  Past Surgical History:  Procedure Laterality Date  . APPLICATION OF CRANIAL NAVIGATION Left 04/29/2018   Procedure: APPLICATION OF CRANIAL NAVIGATION;  Surgeon: Earnie Larsson, MD;  Location: [facility];  Service: Neurosurgery;  Laterality: Left;  . CRANIOTOMY Left 04/29/2018   Procedure: LEFT CRANIOTOMY FOR  TUMOR BRAIN LAB;  Surgeon: Earnie Larsson, MD;  Location: [facility];  Service: Neurosurgery;  Laterality: Left;    REVIEW OF SYSTEMS:   Review of Systems  Constitutional: Negative  for appetite change, chills, fatigue, fever and unexpected weight change.  HENT: Negative for mouth sores, nosebleeds, sore throat and trouble swallowing.  Eyes: Negative for eye problems and icterus.  Respiratory: Negative for cough, hemoptysis, shortness of breath and wheezing.  Cardiovascular: Negative for chest pain and leg swelling.  Gastrointestinal: Negative for abdominal pain, constipation, diarrhea, nausea and vomiting.  Genitourinary: Negative for bladder incontinence, difficulty urinating, dysuria, frequency and hematuria.  Musculoskeletal: Negative for back pain, gait problem, neck pain and neck stiffness.  Skin:Positive for occasional itching without rash. Neurological: Negative for dizziness, extremity weakness, gait problem, headaches, light-headedness and seizures.  Hematological: Negative for adenopathy. Does not bruise/bleed easily.  Psychiatric/Behavioral: Positive for insomnia. Negative for confusion and depression. The patient is not nervous/anxious.    PHYSICAL EXAMINATION:  Blood pressure 118/84, pulse 63, temperature 98.1 F  (36.7 C), temperature source Temporal, resp. rate 18, height 5' 9"  (1.753 m), weight 142 lb 12.8 oz (64.8 kg), SpO2 100 %, unknown if currently breastfeeding.  ECOG PERFORMANCE STATUS: 1 - Symptomatic but completely ambulatory  Physical Exam  Constitutional: Oriented to person, place, and time and well-developed, well-nourished, and in no distress.  HENT:  Head: Normocephalic and atraumatic.  Mouth/Throat: Oropharynx is clear and moist. No oropharyngeal exudate.  Eyes: Conjunctivae are normal. Right eye exhibits no discharge. Left eye exhibits no discharge. No scleral icterus.  Neck: Normal range of motion. Neck supple.  Cardiovascular: Normal rate, regular rhythm, normal heart sounds and intact distal pulses.   Pulmonary/Chest: Effort normal and breath sounds normal. No respiratory distress. No wheezes. No rales.  Abdominal: Soft. Bowel sounds are normal. Exhibits no distension and no mass. There is no tenderness.  Musculoskeletal: Normal range of motion. Exhibits no edema.  Lymphadenopathy:    No cervical adenopathy.  Neurological: Alert and oriented to person, place, and time. Exhibits normal muscle tone. Gait normal. Coordination normal.  Skin: Skin is warm and dry. No rash noted. Not diaphoretic. No erythema. No pallor.  Psychiatric: Mood, memory and judgment normal.  Vitals reviewed.  LABORATORY DATA: Lab Results  Component Value Date   WBC 5.1 06/18/2020   HGB 14.0 06/18/2020   HCT 41.3 06/18/2020   MCV 92.0 06/18/2020   PLT 240 06/18/2020      Chemistry      Component Value Date/Time   NA 138 06/18/2020 1440   K 4.1 06/18/2020 1440   CL 107 06/18/2020 1440   CO2 24 06/18/2020 1440   BUN 10 06/18/2020 1440   CREATININE 1.02 (H) 06/18/2020 1440      Component Value Date/Time   CALCIUM 9.3 06/18/2020 1440   ALKPHOS 82 06/18/2020 1440   AST 41 06/18/2020 1440   ALT 32 06/18/2020 1440   BILITOT 0.2 (L) 06/18/2020 1440       RADIOGRAPHIC STUDIES:  No results  found.   ASSESSMENT/PLAN:  This is a very pleasant 44 year old Caucasian female who presented with a large mass in theleftupper/lower lobe and mediastinal adenopathy as well as a solitary brain metastasis highly suspicious for malignant melanoma.The final pathology report from [facility] malignancy was consistent with metastatic melanoma.  She is status post a left temporal craniotomy and resection of the tumor on 04/29/2018. The patient also completed stereotactic radiotherapy to the resection cavity under the care of Dr. Lisbeth Renshaw. She previously underwent treatment with immunotherapy with Keytruda 200 mg IV every 3 weeks. She is status post 3 cycles. CT scanafter cycle #3 showed enlargement of the left upper lobe lung mass.  This was suspicious for pseudo-progression on immunotherapy. The patient was started on palliative course of radiotherapy to the left upper lobe mass and she tolerated this treatment fairly well. She was seen by Dr. Gavin Potters and she recommended for the patient to resume her treatment with [facility] until she undergoes further molecular studies. The patient was treated with 2 more cycles of Keytruda and tolerated the treatment well. The patient then had a CT scan performed which showed pericardial invasion. Therefore,the patient is not a good candidate for surgical resection.  From her final pathology report and recommendations from [facility], the patient was switched to immunotherapy withipilimumaband nivolumab. She isstatus post 2 cycles.Treatment was on hold for more than 6 weeks secondary to grade 3 hepatic dysfunction. She had improvement of her liver enzymes after a prolonged treatment with a tapering scheduleof steroids.  Sheiscurrently undergoing single agent nivolumab 240 mgIV every 2 weeks. She is statuspost an additional30cycles with nivolumab240 mg IV every 2 weeks. She has  beentoleratingtreatment well  Labs were reviewed. Recommend that she proceed with cycle #31today as scheduled.  We will see her back for follow-up visit in 2 weeks for evaluation before starting cycle #32.  The patient was advised to call immediately if she has any concerning symptoms in the interval. The patient voices understanding of current disease status and treatment options and is in agreement with the current care plan. All questions were answered. The patient knows to call the clinic with any problems, questions or concerns. We can certainly see the patient much sooner if necessary  No orders of the defined types were placed in this encounter.    Cassandra L Heilingoetter, PA-C 06/18/20

## 2020-06-18 ENCOUNTER — Inpatient Hospital Stay: Payer: BC Managed Care – PPO | Attending: Physician Assistant

## 2020-06-18 ENCOUNTER — Other Ambulatory Visit: Payer: Self-pay | Admitting: Internal Medicine

## 2020-06-18 ENCOUNTER — Other Ambulatory Visit: Payer: Self-pay

## 2020-06-18 ENCOUNTER — Inpatient Hospital Stay (HOSPITAL_BASED_OUTPATIENT_CLINIC_OR_DEPARTMENT_OTHER): Payer: BC Managed Care – PPO | Admitting: Physician Assistant

## 2020-06-18 ENCOUNTER — Inpatient Hospital Stay: Payer: BC Managed Care – PPO

## 2020-06-18 VITALS — BP 118/84 | HR 63 | Temp 98.1°F | Resp 18 | Ht 69.0 in | Wt 142.8 lb

## 2020-06-18 DIAGNOSIS — K7689 Other specified diseases of liver: Secondary | ICD-10-CM | POA: Diagnosis not present

## 2020-06-18 DIAGNOSIS — C439 Malignant melanoma of skin, unspecified: Secondary | ICD-10-CM | POA: Insufficient documentation

## 2020-06-18 DIAGNOSIS — C7802 Secondary malignant neoplasm of left lung: Secondary | ICD-10-CM | POA: Insufficient documentation

## 2020-06-18 DIAGNOSIS — R87810 Cervical high risk human papillomavirus (HPV) DNA test positive: Secondary | ICD-10-CM | POA: Diagnosis not present

## 2020-06-18 DIAGNOSIS — Z5112 Encounter for antineoplastic immunotherapy: Secondary | ICD-10-CM

## 2020-06-18 DIAGNOSIS — C7931 Secondary malignant neoplasm of brain: Secondary | ICD-10-CM | POA: Diagnosis not present

## 2020-06-18 DIAGNOSIS — Z79899 Other long term (current) drug therapy: Secondary | ICD-10-CM | POA: Insufficient documentation

## 2020-06-18 DIAGNOSIS — Z1231 Encounter for screening mammogram for malignant neoplasm of breast: Secondary | ICD-10-CM | POA: Diagnosis not present

## 2020-06-18 DIAGNOSIS — Z01419 Encounter for gynecological examination (general) (routine) without abnormal findings: Secondary | ICD-10-CM | POA: Diagnosis not present

## 2020-06-18 DIAGNOSIS — Z6821 Body mass index (BMI) 21.0-21.9, adult: Secondary | ICD-10-CM | POA: Diagnosis not present

## 2020-06-18 LAB — CBC WITH DIFFERENTIAL (CANCER CENTER ONLY)
Abs Immature Granulocytes: 0.01 10*3/uL (ref 0.00–0.07)
Basophils Absolute: 0 10*3/uL (ref 0.0–0.1)
Basophils Relative: 1 %
Eosinophils Absolute: 0.1 10*3/uL (ref 0.0–0.5)
Eosinophils Relative: 2 %
HCT: 41.3 % (ref 36.0–46.0)
Hemoglobin: 14 g/dL (ref 12.0–15.0)
Immature Granulocytes: 0 %
Lymphocytes Relative: 17 %
Lymphs Abs: 0.9 10*3/uL (ref 0.7–4.0)
MCH: 31.2 pg (ref 26.0–34.0)
MCHC: 33.9 g/dL (ref 30.0–36.0)
MCV: 92 fL (ref 80.0–100.0)
Monocytes Absolute: 0.6 10*3/uL (ref 0.1–1.0)
Monocytes Relative: 12 %
Neutro Abs: 3.5 10*3/uL (ref 1.7–7.7)
Neutrophils Relative %: 68 %
Platelet Count: 240 10*3/uL (ref 150–400)
RBC: 4.49 MIL/uL (ref 3.87–5.11)
RDW: 12.9 % (ref 11.5–15.5)
WBC Count: 5.1 10*3/uL (ref 4.0–10.5)
nRBC: 0 % (ref 0.0–0.2)

## 2020-06-18 LAB — CMP (CANCER CENTER ONLY)
ALT: 32 U/L (ref 0–44)
AST: 41 U/L (ref 15–41)
Albumin: 4.2 g/dL (ref 3.5–5.0)
Alkaline Phosphatase: 82 U/L (ref 38–126)
Anion gap: 7 (ref 5–15)
BUN: 10 mg/dL (ref 6–20)
CO2: 24 mmol/L (ref 22–32)
Calcium: 9.3 mg/dL (ref 8.9–10.3)
Chloride: 107 mmol/L (ref 98–111)
Creatinine: 1.02 mg/dL — ABNORMAL HIGH (ref 0.44–1.00)
GFR, Est AFR Am: >60 mL/min (ref >60)
GFR, Estimated: >60 mL/min (ref >60)
Glucose, Bld: 80 mg/dL (ref 70–99)
Potassium: 4.1 mmol/L (ref 3.5–5.1)
Sodium: 138 mmol/L (ref 135–145)
Total Bilirubin: 0.2 mg/dL — ABNORMAL LOW (ref 0.3–1.2)
Total Protein: 6.9 g/dL (ref 6.5–8.1)

## 2020-06-18 MED ORDER — SODIUM CHLORIDE 0.9 % IV SOLN
240.0000 mg | Freq: Once | INTRAVENOUS | Status: AC
  Administered 2020-06-18: 240 mg via INTRAVENOUS
  Filled 2020-06-18: qty 24

## 2020-06-18 MED ORDER — SODIUM CHLORIDE 0.9 % IV SOLN
Freq: Once | INTRAVENOUS | Status: AC
  Filled 2020-06-18: qty 250

## 2020-06-18 NOTE — Patient Instructions (Signed)
[facility] Discharge Instructions for Patients Receiving Chemotherapy  Today you received the following chemotherapy agents: Opdivo  To help prevent nausea and vomiting after your treatment, we encourage you to take your nausea medication as directed.    If you develop nausea and vomiting that is not controlled by your nausea medication, call the clinic.   BELOW ARE SYMPTOMS THAT SHOULD BE REPORTED IMMEDIATELY:  *FEVER GREATER THAN 100.5 F  *CHILLS WITH OR WITHOUT FEVER  NAUSEA AND VOMITING THAT IS NOT CONTROLLED WITH YOUR NAUSEA MEDICATION  *UNUSUAL SHORTNESS OF BREATH  *UNUSUAL BRUISING OR BLEEDING  TENDERNESS IN MOUTH AND THROAT WITH OR WITHOUT PRESENCE OF ULCERS  *URINARY PROBLEMS  *BOWEL PROBLEMS  UNUSUAL RASH Items with * indicate a potential emergency and should be followed up as soon as possible.  Feel free to call the clinic should you have any questions or concerns. The clinic phone number is [phone].  Please show the [facility] at check-in to the Emergency Department and triage nurse.

## 2020-06-20 ENCOUNTER — Telehealth: Payer: Self-pay | Admitting: Internal Medicine

## 2020-06-20 NOTE — Telephone Encounter (Signed)
Scheduled per 07/06 los, patient has been called and notified.

## 2020-06-21 ENCOUNTER — Inpatient Hospital Stay (HOSPITAL_BASED_OUTPATIENT_CLINIC_OR_DEPARTMENT_OTHER): Payer: BC Managed Care – PPO | Admitting: Internal Medicine

## 2020-06-21 ENCOUNTER — Telehealth: Payer: Self-pay | Admitting: Internal Medicine

## 2020-06-21 DIAGNOSIS — C7931 Secondary malignant neoplasm of brain: Secondary | ICD-10-CM | POA: Diagnosis not present

## 2020-06-21 NOTE — Progress Notes (Signed)
I connected with Leslie [name] on 06/21/20 at 10:00 AM EDT by telephone visit and verified that I am speaking with the correct person using two identifiers.  I discussed the limitations, risks, security and privacy concerns of performing an evaluation and management service by telemedicine and the availability of in-person appointments. I also discussed with the patient that there may be a patient responsible charge related to this service. The patient expressed understanding and agreed to proceed.  Other persons participating in the visit and their role in the encounter:  n/a  Patient's location:  Home  Provider's location:  Office  Chief Complaint:  Brain metastasis ([place])  History of Present Ilness: Leslie [name] describes no new or progressive neurologic deficits today.  Denies seizures or headaches.  Continues to remain active working, parenting, and with athletic endeavors.   Observations: Language and cognition normal  MRI brain from [facility] dated 06/16/20 was reviewed-    Imaging:  [facility] Clinician Interpretation: I have personally reviewed the CNS images as listed.  My interpretation, in the context of the patient's clinical presentation, is stable disease  Assessment and Plan: Brain metastasis ([place]) Clinically and radiographically stable today Follow Up Instructions: Follow up with repeat MRI brain in 4 months  I discussed the assessment and treatment plan with the patient.  The patient was provided an opportunity to ask questions and all were answered.  The patient agreed with the plan and demonstrated understanding of the instructions.    The patient was advised to call back or seek an in-person evaluation if the symptoms worsen or if the condition fails to improve as anticipated.  I provided 5-10 minutes of non-face-to-face time during this enocunter.  Ventura Sellers, MD   I provided 15 minutes of non face-to-face telephone visit time during this encounter, and > 50%  was spent counseling as documented under my assessment & plan.

## 2020-06-21 NOTE — Telephone Encounter (Signed)
Patient called in. Will be out of town week of 8/3 when she has treatment. Would like to skip it or scheduled for the previous or following week. Sent message to MD. Will call patient back with response

## 2020-06-24 ENCOUNTER — Telehealth: Payer: Self-pay | Admitting: Internal Medicine

## 2020-06-24 NOTE — Telephone Encounter (Signed)
Scheduled per 7/9 los. Pt is aware of appt time and date.

## 2020-06-25 ENCOUNTER — Other Ambulatory Visit: Payer: BC Managed Care – PPO

## 2020-06-25 ENCOUNTER — Ambulatory Visit: Payer: BC Managed Care – PPO

## 2020-06-25 ENCOUNTER — Ambulatory Visit: Payer: BC Managed Care – PPO | Admitting: Internal Medicine

## 2020-06-27 ENCOUNTER — Telehealth: Payer: Self-pay | Admitting: Internal Medicine

## 2020-06-27 NOTE — Telephone Encounter (Signed)
Changed time of 7/20 appt due to Provider PAL. Called and left msg.

## 2020-06-29 NOTE — Progress Notes (Signed)
[place] OFFICE PROGRESS NOTE  System, Pcp Not In No address on file  DIAGNOSIS: Metastatic high-grade neoplasm consistent with metastatic malignant melanoma based on the most recent pathology report from [facility] in October 2019, presented with large left left upper/left lower lobe mass with a hypermetabolic AP window lymph node as well as solitary metastatic brain lesion diagnosed in May 2019.  Biomarker Findings Microsatellite status - MS-Stable Tumor Mutational Burden - TMB-Intermediate (16 Muts/Mb) Genomic Findings For a complete list of the genes assayed, please refer to the Appendix. CD274 (PD-L1) amplification NRAS Q61R PDCD1LG2 (PD-L2) amplification MYC amplification EPHB1 amplification JAK2 amplification RB1 Q93*, W299* TERT promoter -146C>T TP53 [id number]  PRIOR THERAPY: 1) left temporal craniotomy with resection of tumor with intraoperative stereotactic guidance for volumetric resection under the care of Dr. Annette Stable on 04/29/2018. 2) First-line treatment with immunotherapy with Keytruda 200 mg IV every 3 weeks. First dose June 02, 2018. Status post 3 cycles. 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 3) palliative radiotherapy to the left lung mass under the care of Dr. Lisbeth Renshaw completed on 08/24/2018. 4) Resuming her treatment again with Keytruda 200 mg IV every 3 weeks, first dose 09/08/2018. Status post 2 cycles  CURRENT THERAPY: Treatment with immunotherapy with ipilimumab 3 mg/KG and nivolumab 1 mg/KG every 3 weeks for the first 4 cycles followed by maintenance nivolumab 240 mg IV every 2 weeks. First dose of this treatment October 26, 2018. Status post31cycles. Ipilimumab was discontinued after cycle #2 for the significant liver dysfunction. Starting from cycle #3 the patient is on treatment with single agent nivolumab.  INTERVAL HISTORY: Leslie [name] 44 y.o. female returns to the clinic  today forafollow-up visit. The patient is feeling well today without any concerning complaints. She has been tolerating her treatment with immunotherapy with nivolumab well without any adverse side effects except for milditching.She is getting her routine skin checks performed by her dermatologist every 6 months. She denies any fever, chills, night sweats, or weight loss. She denies any chest pain, shortness of breath, cough, or hemoptysis. She denies any nausea, vomiting, diarrhea, or constipation. She denies any headache or visual changes.She is planning on returning to work full time. She is schedule for her PET scan at [facility] 7/23. She is here for evaluation before starting cycle #32  MEDICAL HISTORY: Past Medical History:  Diagnosis Date  . 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 Migraines   . Yeast infection     ALLERGIES:  is allergic to diflucan [fluconazole].  MEDICATIONS:  Current Outpatient Medications  Medication Sig Dispense Refill  . AMERICAN GINSENG PO Take 1,500-3,000 mg by mouth daily. Depending on level of fatigue    . Amino Acids (L-CARNITINE) LIQD Take 2,000 mg by mouth daily. Liquid L-Carnitine 2000 mg    . Ascorbic Acid (LIQUID C 500) 500 MG/15ML LIQD Take 1,000 mg by mouth daily.    . Beta Glucan POWD 1 Dose by Does not apply route daily. Beta D Glucan 400 mg    . Calcium-Magnesium (CAL-MAG PO) Take 15 mLs by mouth daily. Marine Based Cal - Mag (500 mg Calcium / 250 mg Mag) 15 ml total    . Coenzyme Q10 (COQ-10) 100 MG CAPS Take 1 tablet by mouth daily.    Marland Kitchen CREATINE MONOHYDRATE PO Take 6 g by mouth daily. Creatine MonoHydrate 6 g (on training cycle)    . doxylamine, Sleep, (UNISOM) 25 MG tablet Take 12.5 mg  by mouth at bedtime as needed for sleep.    . ferrous sulfate 324 MG TBEC Take 324 mg by mouth 2 (two) times daily.    . Glucosamine-Chondroit-Vit C-Mn (GLUCOSAMINE 1500 COMPLEX PO) Take 1 tablet by mouth daily. Vegan CMO Glucosamine     . Hyaluronic Acid-Vitamin C (HYALURONIC ACID PO) Take 100 mg by mouth daily. Liquid Hyaluronic Acid 100 mg    . Iron-Folic Acid-Vit Y07 (IRON FORMULA PO) Take 1 Dose by mouth daily. Iron 50 mg (1/2 before workout and 1/2 afterwards - cycle 1 wk off and on)    . Lysine POWD 1,600 mg by Does not apply route daily. L-Lysine Powder 1600 mg    . Methylsulfonylmethane (MSM) 1000 MG CAPS Take 4 capsules by mouth daily. 4000 mg (1/2 before workout and 1/2 afterwards)    . Multiple Vitamin (MULTIVITAMIN) tablet Take 1 tablet by mouth daily.    . Multiple Vitamins-Minerals (MY-VITALIFE) CAPS Take by mouth.    . Multiple Vitamins-Minerals (ZINC PO) Take 1 tablet by mouth daily. Raw Zinc 30 mg (cycle every other week)    . nivolumab (OPDIVO) 40 MG/4ML SOLN chemo injection Opdivo 40 mg/4 mL intravenous solution  INFUSE 40 MG OVER 30 MINUTE(S) BY INTRAVENOUS ROUTE EVERY 2 WEEKS    . NON FORMULARY Take 1 Dose by mouth daily. Cordyceps 750 mg    . NON FORMULARY Take 1 Dose by mouth daily. Liver Herbal Supplement 675 mg    . NON FORMULARY Take 1 Dose by mouth daily. Reishi Mushroom 500 mg    . NON FORMULARY Take 1 Dose by mouth daily. Curcumin Phytosome (Bioperrine) 3000 mg    . NON FORMULARY Take 1 tablet by mouth daily. Artemisnin 200 mg    . NON FORMULARY Take 1 tablet by mouth daily. Wellness Herbal Tablet as needed (Elderberry, Ashwagandha, etc)    . NON FORMULARY Take 1 tablet by mouth daily. Zinc L Carnosine Complex 75/59 mg (after workout)    . Omega 3-6-9 Fatty Acids (OMEGA-3-6-9 PO) Take 2 capsules by mouth daily. Plant Based Omega 3-6-9 2 capsules = 1825 mg    . OVER THE COUNTER MEDICATION TK 5 MILLILITERS PO QID PRF MOUTH PAIN    . QUERCETIN PO Take 1 tablet by mouth daily. Quercetin 400 mg / Bromelain 85 mg    . RESVERATROL PO Take 1,200 mg by mouth daily. Trans Resveratrol    . SM Omega-3-6-9 Fatty Acids CAPS Take by mouth.    Marland Kitchen VITAMIN A PO Take 1 tablet by mouth daily. 1500 mcg (5000 IU)    .  Vitamin D-Vitamin K (VITAMIN K2-VITAMIN D3 PO) Take 1 capsule by mouth daily. 5000 IU + 50 mcg    . vitamin E 180 MG (400 UNITS) capsule Take by mouth.    Marland Kitchen VITAMIN E PO Take 1 tablet by mouth daily. Vegan Vitamin E     No current facility-administered medications for this visit.    SURGICAL HISTORY:  Past Surgical History:  Procedure Laterality Date  . APPLICATION OF CRANIAL NAVIGATION Left 04/29/2018   Procedure: APPLICATION OF CRANIAL NAVIGATION;  Surgeon: Earnie Larsson, MD;  Location: [place];  Service: Neurosurgery;  Laterality: Left;  . CRANIOTOMY Left 04/29/2018   Procedure: LEFT CRANIOTOMY FOR  TUMOR BRAIN LAB;  Surgeon: Earnie Larsson, MD;  Location: [place];  Service: Neurosurgery;  Laterality: Left;    REVIEW OF SYSTEMS:   Review of Systems  Constitutional: Negative for appetite change, chills, fatigue, fever and unexpected weight  change.  HENT: Negative for mouth sores, nosebleeds, sore throat and trouble swallowing.  Eyes: Negative for eye problems and icterus.  Respiratory: Negative for cough, hemoptysis, shortness of breath and wheezing.  Cardiovascular: Negative for chest pain and leg swelling.  Gastrointestinal: Negative for abdominal pain, constipation, diarrhea, nausea and vomiting.  Genitourinary: Negative for bladder incontinence, difficulty urinating, dysuria, frequency and hematuria.  Musculoskeletal: Negative for back pain, gait problem, neck pain and neck stiffness.  Skin:Positive for occasional itching without rash. Neurological: Negative for dizziness, extremity weakness, gait problem, headaches, light-headedness and seizures.  Hematological: Negative for adenopathy. Does not bruise/bleed easily.  Psychiatric/Behavioral:Positive for insomnia.Negative for confusionanddepression.The patient is not nervous/anxious.     PHYSICAL EXAMINATION:  Blood pressure 124/75, pulse (!) 58, temperature 97.7 F (36.5 C), temperature source Temporal, resp. rate 14, weight  144 lb (65.3 kg), SpO2 100 %, unknown if currently breastfeeding.  ECOG PERFORMANCE STATUS: 1 - Symptomatic but completely ambulatory  Physical Exam  Constitutional: Oriented to person, place, and time and well-developed, well-nourished, and in no distress. HENT:  Head: Normocephalic and atraumatic.  Mouth/Throat: Oropharynx is clear and moist. No oropharyngeal exudate.  Eyes: Conjunctivae are normal. Right eye exhibits no discharge. Left eye exhibits no discharge. No scleral icterus.  Neck: Normal range of motion. Neck supple.  Cardiovascular: Normal rate, regular rhythm, normal heart sounds and intact distal pulses.   Pulmonary/Chest: Effort normal and breath sounds normal. No respiratory distress. No wheezes. No rales.  Abdominal: Soft. Bowel sounds are normal. Exhibits no distension and no mass. There is no tenderness.  Musculoskeletal: Normal range of motion. Exhibits no edema.  Lymphadenopathy:    No cervical adenopathy.  Neurological: Alert and oriented to person, place, and time. Exhibits normal muscle tone. Gait normal. Coordination normal.  Skin: Skin is warm and dry. No rash noted. Not diaphoretic. No erythema. No pallor.  Psychiatric: Mood, memory and judgment normal.  Vitals reviewed.  LABORATORY DATA: Lab Results  Component Value Date   WBC 3.5 (L) 07/02/2020   HGB 13.8 07/02/2020   HCT 40.7 07/02/2020   MCV 90.4 07/02/2020   PLT 265 07/02/2020      Chemistry      Component Value Date/Time   NA 140 07/02/2020 1010   K 4.2 07/02/2020 1010   CL 105 07/02/2020 1010   CO2 27 07/02/2020 1010   BUN 12 07/02/2020 1010   CREATININE 1.03 (H) 07/02/2020 1010      Component Value Date/Time   CALCIUM 9.4 07/02/2020 1010   ALKPHOS 88 07/02/2020 1010   AST 35 07/02/2020 1010   ALT 25 07/02/2020 1010   BILITOT 0.2 (L) 07/02/2020 1010       RADIOGRAPHIC STUDIES:  No results found.   ASSESSMENT/PLAN:  This is a very pleasant 45 year old Caucasian female who  presented with a large mass in theleftupper/lower lobe and mediastinal adenopathy as well as a solitary brain metastasis highly suspicious for malignant melanoma.The final pathology report from [facility] malignancy was consistent with metastatic melanoma.  She is status post a left temporal craniotomy and resection of the tumor on 04/29/2018. The patient also completed stereotactic radiotherapy to the resection cavity under the care of Dr. Lisbeth Renshaw. She previously underwent treatment with immunotherapy with Keytruda 200 mg IV every 3 weeks. She is status post 3 cycles. CT scanafter cycle #3 showed enlargement of the left upper lobe lung mass. This was suspicious for pseudo-progression on immunotherapy. The patient was started on palliative course of radiotherapy to the left  upper lobe mass and she tolerated this treatment fairly well. She was seen by Dr. Gavin Potters and she recommended for the patient to resume her treatment with [facility] until she undergoes further molecular studies. The patient was treated with 2 more cycles of Keytruda and tolerated the treatment well. The patient then had a CT scan performed which showed pericardial invasion. Therefore,the patient is not a good candidate for surgical resection.  From her final pathology report and recommendations from [facility], the patient was switched to immunotherapy withipilimumaband nivolumab. She isstatus post 2 cycles.Treatment was on hold for more than 6 weeks secondary to grade 3 hepatic dysfunction. She had improvement of her liver enzymes after a prolonged treatment with a tapering scheduleof steroids.  Sheiscurrently undergoing single agent nivolumab 240 mgIV every 2 weeks. She is statuspost an additional31cycles with nivolumab240 mg IV every 2 weeks. She has beentoleratingtreatment well  The patient was seen with Dr. Julien Nordmann. Labs were reviewed.  Recommend that she proceed with cycle #32today as scheduled.  We will see her back for follow-up visit in 2 weeks for evaluation before starting cycle #33.  The patient was advised to call immediately if she has any concerning symptoms in the interval. The patient voices understanding of current disease status and treatment options and is in agreement with the current care plan. All questions were answered. The patient knows to call the clinic with any problems, questions or concerns. We can certainly see the patient much sooner if necessary     No orders of the defined types were placed in this encounter.    Azavier Creson L Aryn Safran, PA-C 07/02/20  ADDENDUM: Hematology/Oncology Attending: I had a face-to-face encounter with the patient today.  I recommended her care plan.  This is a very pleasant 45 years old white female with metastatic malignant melanoma status post surgical resection of metastatic brain lesion in addition to palliative radiotherapy to the right hilar mass.  The patient also started treatment with single agent Keytruda but this was discontinued secondary to suspicious disease progression.  Her treatment was switched to induction with ipilimumab and nivolumab for 4 cycles and she is currently on treatment with single agent nivolumab every 2 weeks status post 31 cycles.  She has been tolerating this treatment well with no concerning complaints. She is scheduled to have repeat PET scan at [facility] next week.  I strongly encouraged the patient to keep her appointment for the PET scan.  She has several question about her current condition and prognosis.  I answered her question to her satisfaction. We will see the patient back for follow-up visit in 2 weeks for evaluation before the next cycle of her treatment. She was advised to call immediately if she has any concerning symptoms in the interval.  Disclaimer: This note was dictated with voice recognition software.  Similar sounding words can inadvertently be transcribed and may be missed upon review. Eilleen Kempf, MD 07/02/20

## 2020-07-02 ENCOUNTER — Other Ambulatory Visit: Payer: BC Managed Care – PPO

## 2020-07-02 ENCOUNTER — Other Ambulatory Visit: Payer: Self-pay

## 2020-07-02 ENCOUNTER — Inpatient Hospital Stay: Payer: BC Managed Care – PPO

## 2020-07-02 ENCOUNTER — Inpatient Hospital Stay: Payer: BC Managed Care – PPO | Admitting: Internal Medicine

## 2020-07-02 ENCOUNTER — Inpatient Hospital Stay (HOSPITAL_BASED_OUTPATIENT_CLINIC_OR_DEPARTMENT_OTHER): Payer: BC Managed Care – PPO | Admitting: Physician Assistant

## 2020-07-02 ENCOUNTER — Encounter: Payer: Self-pay | Admitting: Physician Assistant

## 2020-07-02 VITALS — BP 124/75 | HR 58 | Temp 97.7°F | Resp 14 | Wt 144.0 lb

## 2020-07-02 DIAGNOSIS — Z79899 Other long term (current) drug therapy: Secondary | ICD-10-CM | POA: Diagnosis not present

## 2020-07-02 DIAGNOSIS — C7931 Secondary malignant neoplasm of brain: Secondary | ICD-10-CM | POA: Diagnosis not present

## 2020-07-02 DIAGNOSIS — Z5112 Encounter for antineoplastic immunotherapy: Secondary | ICD-10-CM

## 2020-07-02 DIAGNOSIS — C7802 Secondary malignant neoplasm of left lung: Secondary | ICD-10-CM

## 2020-07-02 DIAGNOSIS — K7689 Other specified diseases of liver: Secondary | ICD-10-CM | POA: Diagnosis not present

## 2020-07-02 DIAGNOSIS — C439 Malignant melanoma of skin, unspecified: Secondary | ICD-10-CM | POA: Diagnosis not present

## 2020-07-02 LAB — CBC WITH DIFFERENTIAL (CANCER CENTER ONLY)
Abs Immature Granulocytes: 0 10*3/uL (ref 0.00–0.07)
Basophils Absolute: 0 10*3/uL (ref 0.0–0.1)
Basophils Relative: 1 %
Eosinophils Absolute: 0.1 10*3/uL (ref 0.0–0.5)
Eosinophils Relative: 3 %
HCT: 40.7 % (ref 36.0–46.0)
Hemoglobin: 13.8 g/dL (ref 12.0–15.0)
Immature Granulocytes: 0 %
Lymphocytes Relative: 23 %
Lymphs Abs: 0.8 10*3/uL (ref 0.7–4.0)
MCH: 30.7 pg (ref 26.0–34.0)
MCHC: 33.9 g/dL (ref 30.0–36.0)
MCV: 90.4 fL (ref 80.0–100.0)
Monocytes Absolute: 0.4 10*3/uL (ref 0.1–1.0)
Monocytes Relative: 13 %
Neutro Abs: 2.1 10*3/uL (ref 1.7–7.7)
Neutrophils Relative %: 60 %
Platelet Count: 265 10*3/uL (ref 150–400)
RBC: 4.5 MIL/uL (ref 3.87–5.11)
RDW: 13 % (ref 11.5–15.5)
WBC Count: 3.5 10*3/uL — ABNORMAL LOW (ref 4.0–10.5)
nRBC: 0 % (ref 0.0–0.2)

## 2020-07-02 LAB — CMP (CANCER CENTER ONLY)
ALT: 25 U/L (ref 0–44)
AST: 35 U/L (ref 15–41)
Albumin: 4.2 g/dL (ref 3.5–5.0)
Alkaline Phosphatase: 88 U/L (ref 38–126)
Anion gap: 8 (ref 5–15)
BUN: 12 mg/dL (ref 6–20)
CO2: 27 mmol/L (ref 22–32)
Calcium: 9.4 mg/dL (ref 8.9–10.3)
Chloride: 105 mmol/L (ref 98–111)
Creatinine: 1.03 mg/dL — ABNORMAL HIGH (ref 0.44–1.00)
GFR, Est AFR Am: >60 mL/min (ref >60)
GFR, Estimated: >60 mL/min (ref >60)
Glucose, Bld: 100 mg/dL — ABNORMAL HIGH (ref 70–99)
Potassium: 4.2 mmol/L (ref 3.5–5.1)
Sodium: 140 mmol/L (ref 135–145)
Total Bilirubin: 0.2 mg/dL — ABNORMAL LOW (ref 0.3–1.2)
Total Protein: 6.9 g/dL (ref 6.5–8.1)

## 2020-07-02 LAB — TSH: TSH: 2.233 u[IU]/mL (ref 0.308–3.960)

## 2020-07-02 MED ORDER — SODIUM CHLORIDE 0.9 % IV SOLN
240.0000 mg | Freq: Once | INTRAVENOUS | Status: AC
  Administered 2020-07-02: 240 mg via INTRAVENOUS
  Filled 2020-07-02: qty 24

## 2020-07-02 MED ORDER — SODIUM CHLORIDE 0.9 % IV SOLN
Freq: Once | INTRAVENOUS | Status: AC
  Filled 2020-07-02: qty 250

## 2020-07-02 NOTE — Patient Instructions (Signed)
[place] Discharge Instructions for Patients Receiving Chemotherapy  Today you received the following chemotherapy agents: Opdivo  To help prevent nausea and vomiting after your treatment, we encourage you to take your nausea medication as directed.    If you develop nausea and vomiting that is not controlled by your nausea medication, call the clinic.   BELOW ARE SYMPTOMS THAT SHOULD BE REPORTED IMMEDIATELY:  *FEVER GREATER THAN 100.5 F  *CHILLS WITH OR WITHOUT FEVER  NAUSEA AND VOMITING THAT IS NOT CONTROLLED WITH YOUR NAUSEA MEDICATION  *UNUSUAL SHORTNESS OF BREATH  *UNUSUAL BRUISING OR BLEEDING  TENDERNESS IN MOUTH AND THROAT WITH OR WITHOUT PRESENCE OF ULCERS  *URINARY PROBLEMS  *BOWEL PROBLEMS  UNUSUAL RASH Items with * indicate a potential emergency and should be followed up as soon as possible.  Feel free to call the clinic should you have any questions or concerns. The clinic phone number is [phone].  Please show the [place] at check-in to the Emergency Department and triage nurse.

## 2020-07-03 ENCOUNTER — Other Ambulatory Visit: Payer: Self-pay | Admitting: Radiation Therapy

## 2020-07-03 DIAGNOSIS — F419 Anxiety disorder, unspecified: Secondary | ICD-10-CM | POA: Diagnosis not present

## 2020-07-05 DIAGNOSIS — C439 Malignant melanoma of skin, unspecified: Secondary | ICD-10-CM | POA: Diagnosis not present

## 2020-07-05 DIAGNOSIS — I519 Heart disease, unspecified: Secondary | ICD-10-CM | POA: Diagnosis not present

## 2020-07-05 DIAGNOSIS — C7931 Secondary malignant neoplasm of brain: Secondary | ICD-10-CM | POA: Diagnosis not present

## 2020-07-05 DIAGNOSIS — R918 Other nonspecific abnormal finding of lung field: Secondary | ICD-10-CM | POA: Diagnosis not present

## 2020-07-09 ENCOUNTER — Encounter: Payer: Self-pay | Admitting: Internal Medicine

## 2020-07-16 ENCOUNTER — Ambulatory Visit: Payer: BC Managed Care – PPO

## 2020-07-16 ENCOUNTER — Ambulatory Visit: Payer: BC Managed Care – PPO | Admitting: Internal Medicine

## 2020-07-16 ENCOUNTER — Other Ambulatory Visit: Payer: BC Managed Care – PPO

## 2020-07-22 DIAGNOSIS — M9903 Segmental and somatic dysfunction of lumbar region: Secondary | ICD-10-CM | POA: Diagnosis not present

## 2020-07-22 DIAGNOSIS — M62838 Other muscle spasm: Secondary | ICD-10-CM | POA: Diagnosis not present

## 2020-07-22 DIAGNOSIS — M2560 Stiffness of unspecified joint, not elsewhere classified: Secondary | ICD-10-CM | POA: Diagnosis not present

## 2020-07-28 NOTE — Progress Notes (Signed)
Leslie [name] OFFICE PROGRESS NOTE  System, Pcp Not In No address on file  DIAGNOSIS: Metastatic high-grade neoplasm consistent with metastatic malignant melanoma based on the most recent pathology report from [facility] in October 2019, presented with large left left upper/left lower lobe mass with a hypermetabolic AP window lymph node as well as solitary metastatic brain lesion diagnosed in May 2019.  Biomarker Findings Microsatellite status - MS-Stable Tumor Mutational Burden - TMB-Intermediate (16 Muts/Mb) Genomic Findings For a complete list of the genes assayed, please refer to the Appendix. CD274 (PD-L1) amplification NRAS Q61R PDCD1LG2 (PD-L2) amplification MYC amplification EPHB1 amplification JAK2 amplification RB1 Q93*, K812* TERT promoter -146C>T TP53 C275W  PRIOR THERAPY: 1) left temporal craniotomy with resection of tumor with intraoperative stereotactic guidance for volumetric resection under the care of Dr. Annette Stable on 04/29/2018. 2) First-line treatment with immunotherapy with Keytruda 200 mg IV every 3 weeks. First dose June 02, 2018. Status post 3 cycles. 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 3) palliative radiotherapy to the left lung mass under the care of Dr. Lisbeth Renshaw completed on 08/24/2018. 4) Resuming her treatment again with Keytruda 200 mg IV every 3 weeks, first dose 09/08/2018. Status post 2 cycles  CURRENT THERAPY: Treatment with immunotherapy with ipilimumab 3 mg/KG and nivolumab 1 mg/KG every 3 weeks for the first 4 cycles followed by maintenance nivolumab 240 mg IV every 2 weeks. First dose of this treatment October 26, 2018. Status post32cycles. Ipilimumab was discontinued after cycle #2 for the significant liver dysfunction. Starting from cycle #3 the patient is on treatment with single agent nivolumab.  INTERVAL HISTORY: Leslie [name] 44 y.o. female returns to the clinic  today forafollow-up visit. The patient is feeling well today without any concerning complaints. She did get sick with vomiting and migraine after her recent PET scan. She recently had a repeat PET/CT scan by Dr. Crisoforo Oxford at [facility] which showed a positive response to treatment with no definite evidence of metabolically active disease. The patient was interested in the every 4 week dosing of her treatment with Nivolumab. However, she did not realized that this was also a dose increase. Given her history of hepatotoxicity with nivolumab and yervoy, there are some concerns regarding dose changes. She has been tolerating her treatment with immunotherapy with nivolumab IV every 2 weeks well without any adverse side effects except for milditching.She is getting her routine skin checks performed by her dermatologist every 6 months. She denies any fever, chills, night sweats, or weight loss. She denies any chest pain, shortness of breath, cough, or hemoptysis. She denies any nausea, vomiting, diarrhea, or constipation. She denies any headache or visual changes.She is here for evaluation before starting cycle #33   MEDICAL HISTORY: Past Medical History:  Diagnosis Date  . 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 Migraines   . Yeast infection     ALLERGIES:  is allergic to diflucan [fluconazole].  MEDICATIONS:  Current Outpatient Medications  Medication Sig Dispense Refill  . AMERICAN GINSENG PO Take 1,500-3,000 mg by mouth daily. Depending on level of fatigue    . Amino Acids (L-CARNITINE) LIQD Take 2,000 mg by mouth daily. Liquid L-Carnitine 2000 mg    . Ascorbic Acid (LIQUID C 500) 500 MG/15ML LIQD Take 1,000 mg by mouth daily.    . Beta Glucan POWD 1 Dose by Does not apply route daily. Beta D Glucan 400 mg    . Calcium-Magnesium (CAL-MAG PO) Take  15 mLs by mouth daily. Marine Based Cal - Mag (500 mg Calcium / 250 mg Mag) 15 ml total    . Coenzyme Q10 (COQ-10) 100 MG CAPS Take 1  tablet by mouth daily.    Marland Kitchen CREATINE MONOHYDRATE PO Take 6 g by mouth daily. Creatine MonoHydrate 6 g (on training cycle)    . doxylamine, Sleep, (UNISOM) 25 MG tablet Take 12.5 mg by mouth at bedtime as needed for sleep.    . ferrous sulfate 324 MG TBEC Take 324 mg by mouth 2 (two) times daily.    . Glucosamine-Chondroit-Vit C-Mn (GLUCOSAMINE 1500 COMPLEX PO) Take 1 tablet by mouth daily. Vegan CMO Glucosamine    . Hyaluronic Acid-Vitamin C (HYALURONIC ACID PO) Take 100 mg by mouth daily. Liquid Hyaluronic Acid 100 mg    . Iron-Folic Acid-Vit V56 (IRON FORMULA PO) Take 1 Dose by mouth daily. Iron 50 mg (1/2 before workout and 1/2 afterwards - cycle 1 wk off and on)    . Lysine POWD 1,600 mg by Does not apply route daily. L-Lysine Powder 1600 mg    . Methylsulfonylmethane (MSM) 1000 MG CAPS Take 4 capsules by mouth daily. 4000 mg (1/2 before workout and 1/2 afterwards)    . Multiple Vitamin (MULTIVITAMIN) tablet Take 1 tablet by mouth daily.    . Multiple Vitamins-Minerals (MY-VITALIFE) CAPS Take by mouth.    . Multiple Vitamins-Minerals (ZINC PO) Take 1 tablet by mouth daily. Raw Zinc 30 mg (cycle every other week)    . nivolumab (OPDIVO) 40 MG/4ML SOLN chemo injection Opdivo 40 mg/4 mL intravenous solution  INFUSE 40 MG OVER 30 MINUTE(S) BY INTRAVENOUS ROUTE EVERY 2 WEEKS    . NON FORMULARY Take 1 Dose by mouth daily. Cordyceps 750 mg    . NON FORMULARY Take 1 Dose by mouth daily. Liver Herbal Supplement 675 mg    . NON FORMULARY Take 1 Dose by mouth daily. Reishi Mushroom 500 mg    . NON FORMULARY Take 1 Dose by mouth daily. Curcumin Phytosome (Bioperrine) 3000 mg    . NON FORMULARY Take 1 tablet by mouth daily. Artemisnin 200 mg    . NON FORMULARY Take 1 tablet by mouth daily. Wellness Herbal Tablet as needed (Elderberry, Ashwagandha, etc)    . NON FORMULARY Take 1 tablet by mouth daily. Zinc L Carnosine Complex 75/59 mg (after workout)    . Omega 3-6-9 Fatty Acids (OMEGA-3-6-9 PO) Take 2  capsules by mouth daily. Plant Based Omega 3-6-9 2 capsules = 1825 mg    . OVER THE COUNTER MEDICATION TK 5 MILLILITERS PO QID PRF MOUTH PAIN    . QUERCETIN PO Take 1 tablet by mouth daily. Quercetin 400 mg / Bromelain 85 mg    . RESVERATROL PO Take 1,200 mg by mouth daily. Trans Resveratrol    . SM Omega-3-6-9 Fatty Acids CAPS Take by mouth.    Marland Kitchen VITAMIN A PO Take 1 tablet by mouth daily. 1500 mcg (5000 IU)    . Vitamin D-Vitamin K (VITAMIN K2-VITAMIN D3 PO) Take 1 capsule by mouth daily. 5000 IU + 50 mcg    . vitamin E 180 MG (400 UNITS) capsule Take by mouth.    Marland Kitchen VITAMIN E PO Take 1 tablet by mouth daily. Vegan Vitamin E     No current facility-administered medications for this visit.   Facility-Administered Medications Ordered in Other Visits  Medication Dose Route Frequency Provider Last Rate Last Admin  . 0.9 %  sodium chloride infusion  Intravenous Once Curt Bears, MD      . nivolumab (OPDIVO) 240 mg in sodium chloride 0.9 % 100 mL chemo infusion  240 mg Intravenous Once Curt Bears, MD        SURGICAL HISTORY:  Past Surgical History:  Procedure Laterality Date  . APPLICATION OF CRANIAL NAVIGATION Left 04/29/2018   Procedure: APPLICATION OF CRANIAL NAVIGATION;  Surgeon: Earnie Larsson, MD;  Location: [facility];  Service: Neurosurgery;  Laterality: Left;  . CRANIOTOMY Left 04/29/2018   Procedure: LEFT CRANIOTOMY FOR  TUMOR BRAIN LAB;  Surgeon: Earnie Larsson, MD;  Location: [facility];  Service: Neurosurgery;  Laterality: Left;    REVIEW OF SYSTEMS:   Review of Systems  Constitutional: Negative for appetite change, chills, fatigue, fever and unexpected weight change.  HENT: Negative for mouth sores, nosebleeds, sore throat and trouble swallowing.  Eyes: Negative for eye problems and icterus.  Respiratory: Negative for cough, hemoptysis, shortness of breath and wheezing.  Cardiovascular: Negative for chest pain and leg swelling.  Gastrointestinal: Negative for abdominal pain,  constipation, diarrhea, nausea and vomiting.  Genitourinary: Negative for bladder incontinence, difficulty urinating, dysuria, frequency and hematuria.  Musculoskeletal: Negative for back pain, gait problem, neck pain and neck stiffness.  Skin:Positive for occasional itching without rash. Neurological: Negative for dizziness, extremity weakness, gait problem, headaches, light-headedness and seizures.  Hematological: Negative for adenopathy. Does not bruise/bleed easily.  Psychiatric/Behavioral:Positive for insomnia.Negative for confusionanddepression.The patient is not nervous/anxious.     PHYSICAL EXAMINATION:  Blood pressure 123/77, pulse 64, temperature (!) 97.5 F (36.4 C), temperature source Temporal, resp. rate 18, height 5' 9"  (1.753 m), weight 145 lb 4.8 oz (65.9 kg), SpO2 100 %, unknown if currently breastfeeding.  ECOG PERFORMANCE STATUS: 0 - Asymptomatic  Physical Exam  Constitutional: Oriented to person, place, and time and well-developed, well-nourished, and in no distress. HENT:  Head: Normocephalic and atraumatic.  Mouth/Throat: Oropharynx is clear and moist. No oropharyngeal exudate.  Eyes: Conjunctivae are normal. Right eye exhibits no discharge. Left eye exhibits no discharge. No scleral icterus.  Neck: Normal range of motion. Neck supple.  Cardiovascular: Normal rate, regular rhythm, normal heart sounds and intact distal pulses.   Pulmonary/Chest: Effort normal and breath sounds normal. No respiratory distress. No wheezes. No rales.  Abdominal: Soft. Bowel sounds are normal. Exhibits no distension and no mass. There is no tenderness.  Musculoskeletal: Normal range of motion. Exhibits no edema.  Lymphadenopathy:    No cervical adenopathy.  Neurological: Alert and oriented to person, place, and time. Exhibits normal muscle tone. Gait normal. Coordination normal.  Skin: Skin is warm and dry. No rash noted. Not diaphoretic. No erythema. No pallor.  Psychiatric:  Mood, memory and judgment normal.  Vitals reviewed.  LABORATORY DATA: Lab Results  Component Value Date   WBC 3.9 (L) 07/30/2020   HGB 13.8 07/30/2020   HCT 40.8 07/30/2020   MCV 91.9 07/30/2020   PLT 231 07/30/2020      Chemistry      Component Value Date/Time   NA 140 07/30/2020 1100   K 4.5 07/30/2020 1100   CL 108 07/30/2020 1100   CO2 24 07/30/2020 1100   BUN 12 07/30/2020 1100   CREATININE 1.01 (H) 07/30/2020 1100      Component Value Date/Time   CALCIUM 9.9 07/30/2020 1100   ALKPHOS 90 07/30/2020 1100   AST 35 07/30/2020 1100   ALT 25 07/30/2020 1100   BILITOT 0.3 07/30/2020 1100       RADIOGRAPHIC STUDIES:  No  results found.   ASSESSMENT/PLAN:  This is a very pleasant 44 year old Caucasian female who presented with a large mass in theleftupper/lower lobe and mediastinal adenopathy as well as a solitary brain metastasis highly suspicious for malignant melanoma.The final pathology report from [facility] malignancy was consistent with metastatic melanoma.  She is status post a left temporal craniotomy and resection of the tumor on 04/29/2018. The patient also completed stereotactic radiotherapy to the resection cavity under the care of Dr. Lisbeth Renshaw. She previously underwent treatment with immunotherapy with Keytruda 200 mg IV every 3 weeks. She is status post 3 cycles. CT scanafter cycle #3 showed enlargement of the left upper lobe lung mass. This was suspicious for pseudo-progression on immunotherapy. The patient was started on palliative course of radiotherapy to the left upper lobe mass and she tolerated this treatment fairly well. She was seen by Dr. Gavin Potters and she recommended for the patient to resume her treatment with [facility] until she undergoes further molecular studies. The patient was treated with 2 more cycles of Keytruda and tolerated the treatment well. The patient then had a CT scan performed  which showed pericardial invasion. Therefore,the patient is not a good candidate for surgical resection.  From her final pathology report and recommendations from [facility], the patient was switched to immunotherapy withipilimumaband nivolumab. She isstatus post 2 cycles.Treatment was on hold for more than 6 weeks secondary to grade 3 hepatic dysfunction. She had improvement of her liver enzymes after a prolonged treatment with a tapering scheduleof steroids.  Sheiscurrently undergoing single agent nivolumab 240 mgIV every 2 weeks. She is statuspost an additional32cycles with nivolumab240 mg IV every 2 weeks. She has beentoleratingtreatment well.  The patient was seen with Dr. Julien Nordmann. The patient had several questions regarding the every 2 vs every 4 week regimen of nivolumab. In the past, the patient had hepatotoxicity from her prior treatment with yervoy and nivolumab. Dr. Julien Nordmann had a lengthy discussion explaining the data/research behind the different dosing options. After discussion, the patient opted to continue with the every 2 week dosing option for Nivolumab. Labs were reviewed. Recommend that she proceed with cycle #33today as scheduled.  We will see her back for follow-up visit in 2 weeks for evaluation before starting cycle #34.  She reports that she got sick after her PET scan at [facility]. She is inquiring if she can have her 6 month follow up scan here. Discussed we can revisit that in a few months. In the meantime, she would need to cancel her scan at [facility] first for insurance authorization.   The patient was advised to call immediately if she has any concerning symptoms in the interval. The patient voices understanding of current disease status and treatment options and is in agreement with the current care plan. All questions were answered. The patient knows to call the clinic with any problems, questions or concerns. We can certainly see the patient  much sooner if necessary  No orders of the defined types were placed in this encounter.    Miking Usrey L Berthold Glace, PA-C 07/30/20  ADDENDUM: Hematology/Oncology Attending: I had a face-to-face encounter with the patient today.  I recommended her care plan.  This is a very pleasant 44 years old white female with metastatic malignant melanoma status post induction treatment with Yervoy and Opdivo for 4 cycles.  She is currently on maintenance treatment with Opdivo every 2 weeks status post 32 cycles.  The patient has been tolerating her treatment well with no concerning adverse  effects.  She was interested in changing her treatment to every 4 weeks but with the same dose of 240 mg. I had a lengthy discussion with the patient today about her current condition and the dosing of her treatment. I explained to the patient that there is no clinical trial to support the treatment with Opdivo 240 mg every 4 weeks and the current FDA approved doses are 240 mg every 2 weeks or 480 mg every 4 weeks.  She has been tried on Opdivo 480 mg every 4 weeks in the past but she has significant toxicity with hepatitis. She has been tolerating the treatment with Opdivo 240 mg IV every 2 weeks much better with no significant toxicity and no evidence for disease progression. After the discussion the patient decided to continue with her current regimen every 2 weeks as planned.  We explained to the patient that if she has planned travel or other issues, she may skip one of the treatment here and there. She will proceed with cycle #33 today. She will come back for follow-up visit in 2 weeks for evaluation before the next cycle of her treatment. The patient was advised to call immediately if she has any concerning symptoms in the interval.  Disclaimer: This note was dictated with voice recognition software. Similar sounding words can inadvertently be transcribed and may be missed upon review. Eilleen Kempf,  MD 07/30/20

## 2020-07-30 ENCOUNTER — Inpatient Hospital Stay (HOSPITAL_BASED_OUTPATIENT_CLINIC_OR_DEPARTMENT_OTHER): Payer: BC Managed Care – PPO | Admitting: Physician Assistant

## 2020-07-30 ENCOUNTER — Inpatient Hospital Stay: Payer: BC Managed Care – PPO | Attending: Physician Assistant

## 2020-07-30 ENCOUNTER — Encounter: Payer: Self-pay | Admitting: Internal Medicine

## 2020-07-30 ENCOUNTER — Inpatient Hospital Stay: Payer: BC Managed Care – PPO

## 2020-07-30 ENCOUNTER — Other Ambulatory Visit: Payer: Self-pay

## 2020-07-30 ENCOUNTER — Encounter: Payer: Self-pay | Admitting: Physician Assistant

## 2020-07-30 VITALS — BP 123/77 | HR 64 | Temp 97.5°F | Resp 18 | Ht 69.0 in | Wt 145.3 lb

## 2020-07-30 DIAGNOSIS — C439 Malignant melanoma of skin, unspecified: Secondary | ICD-10-CM | POA: Diagnosis not present

## 2020-07-30 DIAGNOSIS — Z79899 Other long term (current) drug therapy: Secondary | ICD-10-CM | POA: Diagnosis not present

## 2020-07-30 DIAGNOSIS — C7802 Secondary malignant neoplasm of left lung: Secondary | ICD-10-CM

## 2020-07-30 DIAGNOSIS — C7931 Secondary malignant neoplasm of brain: Secondary | ICD-10-CM | POA: Insufficient documentation

## 2020-07-30 DIAGNOSIS — Z5112 Encounter for antineoplastic immunotherapy: Secondary | ICD-10-CM | POA: Diagnosis not present

## 2020-07-30 DIAGNOSIS — Z923 Personal history of irradiation: Secondary | ICD-10-CM | POA: Diagnosis not present

## 2020-07-30 LAB — CMP (CANCER CENTER ONLY)
ALT: 25 U/L (ref 0–44)
AST: 35 U/L (ref 15–41)
Albumin: 4.2 g/dL (ref 3.5–5.0)
Alkaline Phosphatase: 90 U/L (ref 38–126)
Anion gap: 8 (ref 5–15)
BUN: 12 mg/dL (ref 6–20)
CO2: 24 mmol/L (ref 22–32)
Calcium: 9.9 mg/dL (ref 8.9–10.3)
Chloride: 108 mmol/L (ref 98–111)
Creatinine: 1.01 mg/dL — ABNORMAL HIGH (ref 0.44–1.00)
GFR, Est AFR Am: >60 mL/min (ref >60)
GFR, Estimated: >60 mL/min (ref >60)
Glucose, Bld: 79 mg/dL (ref 70–99)
Potassium: 4.5 mmol/L (ref 3.5–5.1)
Sodium: 140 mmol/L (ref 135–145)
Total Bilirubin: 0.3 mg/dL (ref 0.3–1.2)
Total Protein: 7 g/dL (ref 6.5–8.1)

## 2020-07-30 LAB — CBC WITH DIFFERENTIAL (CANCER CENTER ONLY)
Abs Immature Granulocytes: 0 10*3/uL (ref 0.00–0.07)
Basophils Absolute: 0 10*3/uL (ref 0.0–0.1)
Basophils Relative: 1 %
Eosinophils Absolute: 0.2 10*3/uL (ref 0.0–0.5)
Eosinophils Relative: 5 %
HCT: 40.8 % (ref 36.0–46.0)
Hemoglobin: 13.8 g/dL (ref 12.0–15.0)
Immature Granulocytes: 0 %
Lymphocytes Relative: 24 %
Lymphs Abs: 1 10*3/uL (ref 0.7–4.0)
MCH: 31.1 pg (ref 26.0–34.0)
MCHC: 33.8 g/dL (ref 30.0–36.0)
MCV: 91.9 fL (ref 80.0–100.0)
Monocytes Absolute: 0.4 10*3/uL (ref 0.1–1.0)
Monocytes Relative: 11 %
Neutro Abs: 2.3 10*3/uL (ref 1.7–7.7)
Neutrophils Relative %: 59 %
Platelet Count: 231 10*3/uL (ref 150–400)
RBC: 4.44 MIL/uL (ref 3.87–5.11)
RDW: 12.9 % (ref 11.5–15.5)
WBC Count: 3.9 10*3/uL — ABNORMAL LOW (ref 4.0–10.5)
nRBC: 0 % (ref 0.0–0.2)

## 2020-07-30 LAB — TSH: TSH: 2.239 u[IU]/mL (ref 0.308–3.960)

## 2020-07-30 MED ORDER — SODIUM CHLORIDE 0.9 % IV SOLN
Freq: Once | INTRAVENOUS | Status: AC
  Filled 2020-07-30: qty 250

## 2020-07-30 MED ORDER — SODIUM CHLORIDE 0.9 % IV SOLN
240.0000 mg | Freq: Once | INTRAVENOUS | Status: AC
  Administered 2020-07-30: 240 mg via INTRAVENOUS
  Filled 2020-07-30: qty 24

## 2020-07-30 NOTE — Patient Instructions (Signed)
[facility] Discharge Instructions for Patients Receiving Chemotherapy  Today you received the following chemotherapy agents: Opdivo  To help prevent nausea and vomiting after your treatment, we encourage you to take your nausea medication as directed.    If you develop nausea and vomiting that is not controlled by your nausea medication, call the clinic.   BELOW ARE SYMPTOMS THAT SHOULD BE REPORTED IMMEDIATELY:  *FEVER GREATER THAN 100.5 F  *CHILLS WITH OR WITHOUT FEVER  NAUSEA AND VOMITING THAT IS NOT CONTROLLED WITH YOUR NAUSEA MEDICATION  *UNUSUAL SHORTNESS OF BREATH  *UNUSUAL BRUISING OR BLEEDING  TENDERNESS IN MOUTH AND THROAT WITH OR WITHOUT PRESENCE OF ULCERS  *URINARY PROBLEMS  *BOWEL PROBLEMS  UNUSUAL RASH Items with * indicate a potential emergency and should be followed up as soon as possible.  Feel free to call the clinic should you have any questions or concerns. The clinic phone number is [phone].  Please show the [facility] at check-in to the Emergency Department and triage nurse.

## 2020-07-31 ENCOUNTER — Telehealth: Payer: Self-pay | Admitting: Internal Medicine

## 2020-07-31 ENCOUNTER — Encounter: Payer: Self-pay | Admitting: Medical Oncology

## 2020-07-31 NOTE — Telephone Encounter (Signed)
Scheduled per los. Called and spoke with patient. Confirmed appt 

## 2020-08-01 ENCOUNTER — Encounter: Payer: Self-pay | Admitting: Internal Medicine

## 2020-08-01 DIAGNOSIS — F419 Anxiety disorder, unspecified: Secondary | ICD-10-CM | POA: Diagnosis not present

## 2020-08-05 ENCOUNTER — Encounter: Payer: Self-pay | Admitting: Internal Medicine

## 2020-08-06 ENCOUNTER — Encounter: Payer: Self-pay | Admitting: Medical Oncology

## 2020-08-13 ENCOUNTER — Inpatient Hospital Stay: Payer: BC Managed Care – PPO

## 2020-08-13 ENCOUNTER — Inpatient Hospital Stay (HOSPITAL_BASED_OUTPATIENT_CLINIC_OR_DEPARTMENT_OTHER): Payer: BC Managed Care – PPO | Admitting: Internal Medicine

## 2020-08-13 ENCOUNTER — Encounter: Payer: Self-pay | Admitting: Internal Medicine

## 2020-08-13 ENCOUNTER — Other Ambulatory Visit: Payer: Self-pay

## 2020-08-13 VITALS — BP 105/73 | HR 65 | Temp 97.6°F | Resp 18 | Ht 69.0 in | Wt 145.0 lb

## 2020-08-13 DIAGNOSIS — C7802 Secondary malignant neoplasm of left lung: Secondary | ICD-10-CM

## 2020-08-13 DIAGNOSIS — Z79899 Other long term (current) drug therapy: Secondary | ICD-10-CM | POA: Diagnosis not present

## 2020-08-13 DIAGNOSIS — C3432 Malignant neoplasm of lower lobe, left bronchus or lung: Secondary | ICD-10-CM | POA: Diagnosis not present

## 2020-08-13 DIAGNOSIS — C7931 Secondary malignant neoplasm of brain: Secondary | ICD-10-CM | POA: Diagnosis not present

## 2020-08-13 DIAGNOSIS — C439 Malignant melanoma of skin, unspecified: Secondary | ICD-10-CM | POA: Diagnosis not present

## 2020-08-13 DIAGNOSIS — Z923 Personal history of irradiation: Secondary | ICD-10-CM | POA: Diagnosis not present

## 2020-08-13 DIAGNOSIS — Z5112 Encounter for antineoplastic immunotherapy: Secondary | ICD-10-CM

## 2020-08-13 LAB — CMP (CANCER CENTER ONLY)
ALT: 24 U/L (ref 0–44)
AST: 32 U/L (ref 15–41)
Albumin: 4.1 g/dL (ref 3.5–5.0)
Alkaline Phosphatase: 82 U/L (ref 38–126)
Anion gap: 5 (ref 5–15)
BUN: 14 mg/dL (ref 6–20)
CO2: 26 mmol/L (ref 22–32)
Calcium: 9.9 mg/dL (ref 8.9–10.3)
Chloride: 105 mmol/L (ref 98–111)
Creatinine: 0.98 mg/dL (ref 0.44–1.00)
GFR, Est AFR Am: >60 mL/min (ref >60)
GFR, Estimated: >60 mL/min (ref >60)
Glucose, Bld: 85 mg/dL (ref 70–99)
Potassium: 4.7 mmol/L (ref 3.5–5.1)
Sodium: 136 mmol/L (ref 135–145)
Total Bilirubin: 0.3 mg/dL (ref 0.3–1.2)
Total Protein: 7.1 g/dL (ref 6.5–8.1)

## 2020-08-13 LAB — CBC WITH DIFFERENTIAL (CANCER CENTER ONLY)
Abs Immature Granulocytes: 0 10*3/uL (ref 0.00–0.07)
Basophils Absolute: 0.1 10*3/uL (ref 0.0–0.1)
Basophils Relative: 1 %
Eosinophils Absolute: 0.1 10*3/uL (ref 0.0–0.5)
Eosinophils Relative: 3 %
HCT: 42.3 % (ref 36.0–46.0)
Hemoglobin: 14.2 g/dL (ref 12.0–15.0)
Immature Granulocytes: 0 %
Lymphocytes Relative: 20 %
Lymphs Abs: 0.9 10*3/uL (ref 0.7–4.0)
MCH: 31.1 pg (ref 26.0–34.0)
MCHC: 33.6 g/dL (ref 30.0–36.0)
MCV: 92.6 fL (ref 80.0–100.0)
Monocytes Absolute: 0.5 10*3/uL (ref 0.1–1.0)
Monocytes Relative: 11 %
Neutro Abs: 3.1 10*3/uL (ref 1.7–7.7)
Neutrophils Relative %: 65 %
Platelet Count: 249 10*3/uL (ref 150–400)
RBC: 4.57 MIL/uL (ref 3.87–5.11)
RDW: 13 % (ref 11.5–15.5)
WBC Count: 4.7 10*3/uL (ref 4.0–10.5)
nRBC: 0 % (ref 0.0–0.2)

## 2020-08-13 LAB — TSH: TSH: 1.965 u[IU]/mL (ref 0.308–3.960)

## 2020-08-13 MED ORDER — SODIUM CHLORIDE 0.9 % IV SOLN
Freq: Once | INTRAVENOUS | Status: AC
  Filled 2020-08-13: qty 250

## 2020-08-13 MED ORDER — SODIUM CHLORIDE 0.9 % IV SOLN
240.0000 mg | Freq: Once | INTRAVENOUS | Status: AC
  Administered 2020-08-13: 240 mg via INTRAVENOUS
  Filled 2020-08-13: qty 24

## 2020-08-13 NOTE — Patient Instructions (Signed)
[facility] Discharge Instructions for Patients Receiving Chemotherapy  Today you received the following chemotherapy agents: Opdivo  To help prevent nausea and vomiting after your treatment, we encourage you to take your nausea medication as directed.    If you develop nausea and vomiting that is not controlled by your nausea medication, call the clinic.   BELOW ARE SYMPTOMS THAT SHOULD BE REPORTED IMMEDIATELY:  *FEVER GREATER THAN 100.5 F  *CHILLS WITH OR WITHOUT FEVER  NAUSEA AND VOMITING THAT IS NOT CONTROLLED WITH YOUR NAUSEA MEDICATION  *UNUSUAL SHORTNESS OF BREATH  *UNUSUAL BRUISING OR BLEEDING  TENDERNESS IN MOUTH AND THROAT WITH OR WITHOUT PRESENCE OF ULCERS  *URINARY PROBLEMS  *BOWEL PROBLEMS  UNUSUAL RASH Items with * indicate a potential emergency and should be followed up as soon as possible.  Feel free to call the clinic should you have any questions or concerns. The clinic phone number is [phone].  Please show the [facility] at check-in to the Emergency Department and triage nurse.

## 2020-08-13 NOTE — Progress Notes (Signed)
[name]   [phone]  OFFICE PROGRESS NOTE  System, Pcp Not In No address on file  DIAGNOSIS: Metastatic high-grade neoplasm consistent with metastatic malignant melanoma based on the most recent pathology report from [facility] in October 2019, presented with large left left upper/left lower lobe mass with a hypermetabolic AP window lymph node as well as solitary metastatic brain lesion diagnosed in May 2019.  Biomarker Findings Microsatellite status - MS-Stable Tumor Mutational Burden - TMB-Intermediate (16 Muts/Mb) Genomic Findings For a complete list of the genes assayed, please refer to the Appendix. CD274 (PD-L1) amplification NRAS Q61R PDCD1LG2 (PD-L2) amplification MYC amplification EPHB1 amplification JAK2 amplification RB1 Q93*, E993* TERT promoter -146C>T TP53 C275W   PRIOR THERAPY: 1) left temporal craniotomy with resection of tumor with intraoperative stereotactic guidance for volumetric resection under the care of Dr. Annette Stable on 04/29/2018. 2) First-line treatment with immunotherapy with Keytruda 200 mg IV every 3 weeks.  First dose June 02, 2018.  Status post 3 cycles.  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 3) palliative radiotherapy to the left lung mass under the care of Dr. Lisbeth Renshaw completed on 08/24/2018. 4) Resuming her treatment again with Keytruda 200 mg IV every 3 weeks, first dose 09/08/2018.  Status post 2 cycles.  CURRENT THERAPY: Treatment with immunotherapy with ipilimumab 3 mg/KG and nivolumab 1 mg/KG every 3 weeks for the first 4 cycles followed by maintenance nivolumab 240 mg IV every 2 weeks.  First dose of this treatment October 26, 2018.  Status post 33 cycles.  Ipilimumab was discontinued after cycle #2 for the significant liver dysfunction.  Starting from cycle #3 the patient is on treatment with single agent nivolumab.  INTERVAL  HISTORY: Leslie [name] 44 y.o. female returns to the clinic today for follow-up visit.  The patient is feeling fine today with no concerning complaints.  She continues to tolerate her treatment with nivolumab fairly well.  She has no nausea, vomiting, diarrhea or constipation.  She denied having any fever or chills.  She has no chest pain, shortness of breath, cough or hemoptysis.  She has no weight loss or night sweats.  She is here today for evaluation before starting cycle #4 of her treatment.  MEDICAL HISTORY: Past Medical History:  Diagnosis Date  . 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 Migraines   . Yeast infection     ALLERGIES:  is allergic to diflucan [fluconazole].  MEDICATIONS:  Current Outpatient Medications  Medication Sig Dispense Refill  . AMERICAN GINSENG PO Take 1,500-3,000 mg by mouth daily. Depending on level of fatigue    . Amino Acids (L-CARNITINE) LIQD Take 2,000 mg by mouth daily. Liquid L-Carnitine 2000 mg    . Ascorbic Acid (LIQUID C 500) 500 MG/15ML LIQD Take 1,000 mg by mouth daily.    . Beta Glucan POWD 1 Dose by Does not apply route daily. Beta D Glucan 400 mg    . Calcium-Magnesium (CAL-MAG PO) Take 15 mLs by mouth daily. Marine Based Cal - Mag (500 mg Calcium / 250 mg Mag) 15 ml total    . Coenzyme Q10 (COQ-10) 100 MG CAPS Take 1 tablet by mouth daily.    Marland Kitchen CREATINE MONOHYDRATE PO Take 6 g by mouth daily. Creatine MonoHydrate 6 g (on training cycle)    . doxylamine, Sleep, (UNISOM) 25 MG tablet Take 12.5 mg by mouth at bedtime as needed for sleep.    Marland Kitchen  ferrous sulfate 324 MG TBEC Take 324 mg by mouth 2 (two) times daily.    . Glucosamine-Chondroit-Vit C-Mn (GLUCOSAMINE 1500 COMPLEX PO) Take 1 tablet by mouth daily. Vegan CMO Glucosamine    . Hyaluronic Acid-Vitamin C (HYALURONIC ACID PO) Take 100 mg by mouth daily. Liquid Hyaluronic Acid 100 mg    . Iron-Folic Acid-Vit Q25 (IRON FORMULA PO) Take 1 Dose by mouth daily. Iron 50  mg (1/2 before workout and 1/2 afterwards - cycle 1 wk off and on)    . Lysine POWD 1,600 mg by Does not apply route daily. L-Lysine Powder 1600 mg    . Methylsulfonylmethane (MSM) 1000 MG CAPS Take 4 capsules by mouth daily. 4000 mg (1/2 before workout and 1/2 afterwards)    . Multiple Vitamin (MULTIVITAMIN) tablet Take 1 tablet by mouth daily.    . Multiple Vitamins-Minerals (MY-VITALIFE) CAPS Take by mouth.    . Multiple Vitamins-Minerals (ZINC PO) Take 1 tablet by mouth daily. Raw Zinc 30 mg (cycle every other week)    . nivolumab (OPDIVO) 40 MG/4ML SOLN chemo injection Opdivo 40 mg/4 mL intravenous solution  INFUSE 40 MG OVER 30 MINUTE(S) BY INTRAVENOUS ROUTE EVERY 2 WEEKS    . NON FORMULARY Take 1 Dose by mouth daily. Cordyceps 750 mg    . NON FORMULARY Take 1 Dose by mouth daily. Liver Herbal Supplement 675 mg    . NON FORMULARY Take 1 Dose by mouth daily. Reishi Mushroom 500 mg    . NON FORMULARY Take 1 Dose by mouth daily. Curcumin Phytosome (Bioperrine) 3000 mg    . NON FORMULARY Take 1 tablet by mouth daily. Artemisnin 200 mg    . NON FORMULARY Take 1 tablet by mouth daily. Wellness Herbal Tablet as needed (Elderberry, Ashwagandha, etc)    . NON FORMULARY Take 1 tablet by mouth daily. Zinc L Carnosine Complex 75/59 mg (after workout)    . Omega 3-6-9 Fatty Acids (OMEGA-3-6-9 PO) Take 2 capsules by mouth daily. Plant Based Omega 3-6-9 2 capsules = 1825 mg    . OVER THE COUNTER MEDICATION TK 5 MILLILITERS PO QID PRF MOUTH PAIN    . QUERCETIN PO Take 1 tablet by mouth daily. Quercetin 400 mg / Bromelain 85 mg    . RESVERATROL PO Take 1,200 mg by mouth daily. Trans Resveratrol    . SM Omega-3-6-9 Fatty Acids CAPS Take by mouth.    Marland Kitchen VITAMIN A PO Take 1 tablet by mouth daily. 1500 mcg (5000 IU)    . Vitamin D-Vitamin K (VITAMIN K2-VITAMIN D3 PO) Take 1 capsule by mouth daily. 5000 IU + 50 mcg    . vitamin E 180 MG (400 UNITS) capsule Take by mouth.    Marland Kitchen VITAMIN E PO Take 1 tablet by  mouth daily. Vegan Vitamin E     No current facility-administered medications for this visit.    SURGICAL HISTORY:  Past Surgical History:  Procedure Laterality Date  . APPLICATION OF CRANIAL NAVIGATION Left 04/29/2018   Procedure: APPLICATION OF CRANIAL NAVIGATION;  Surgeon: Earnie Larsson, MD;  Location: [place];  Service: Neurosurgery;  Laterality: Left;  . CRANIOTOMY Left 04/29/2018   Procedure: LEFT CRANIOTOMY FOR  TUMOR BRAIN LAB;  Surgeon: Earnie Larsson, MD;  Location: [place];  Service: Neurosurgery;  Laterality: Left;    REVIEW OF SYSTEMS:  A comprehensive review of systems was negative.   PHYSICAL EXAMINATION: General appearance: alert, cooperative and no distress Head: Normocephalic, without obvious abnormality, atraumatic Neck: no adenopathy, no  JVD, supple, symmetrical, trachea midline and thyroid not enlarged, symmetric, no tenderness/mass/nodules Lymph nodes: Cervical, supraclavicular, and axillary nodes normal. Resp: clear to auscultation bilaterally Back: negative, symmetric, no curvature. ROM normal. No CVA tenderness. Cardio: regular rate and rhythm, S1, S2 normal, no murmur, click, rub or gallop GI: soft, non-tender; bowel sounds normal; no masses,  no organomegaly Extremities: extremities normal, atraumatic, no cyanosis or edema  ECOG PERFORMANCE STATUS: 0 - Asymptomatic  Blood pressure 105/73, pulse 65, temperature 97.6 F (36.4 C), temperature source Tympanic, resp. rate 18, height 5' 9"  (1.753 m), weight 145 lb (65.8 kg), SpO2 100 %, unknown if currently breastfeeding.  LABORATORY DATA: Lab Results  Component Value Date   WBC 4.7 08/13/2020   HGB 14.2 08/13/2020   HCT 42.3 08/13/2020   MCV 92.6 08/13/2020   PLT 249 08/13/2020      Chemistry      Component Value Date/Time   NA 140 07/30/2020 1100   K 4.5 07/30/2020 1100   CL 108 07/30/2020 1100   CO2 24 07/30/2020 1100   BUN 12 07/30/2020 1100   CREATININE 1.01 (H) 07/30/2020 1100      Component Value  Date/Time   CALCIUM 9.9 07/30/2020 1100   ALKPHOS 90 07/30/2020 1100   AST 35 07/30/2020 1100   ALT 25 07/30/2020 1100   BILITOT 0.3 07/30/2020 1100       RADIOGRAPHIC STUDIES: No results found.  ASSESSMENT AND PLAN: This is a very pleasant 44 years old white female with highly suspicious metastatic malignant melanoma presented with large mass in the left upper/left lower lobe and mediastinal lymphadenopathy as well as solitary brain metastasis status post left temporal craniotomy and resection of tumor on 04/29/2018 and she is recovering well from her surgery. The patient completed stereotactic radiotherapy to the resection cavity next week under the care of Dr. Lisbeth Renshaw. The patient underwent treatment with immunotherapy with Keytruda 200 mg IV every 3 weeks status post 3 cycles.   Her scan after cycle #3 showed enlargement of the left upper lobe lung mass.  This was also suspicious for pseudo-progression on immunotherapy.  The patient was started on a palliative course of radiotherapy to the left upper lobe lung mass and she tolerated this treatment fairly well.  She was seen by Dr. Emelda Brothers at [facility] and she recommended for the patient to resume her treatment with [facility] for now until she undergoes further molecular studies.  The patient was treated with 2 more cycles of Keytruda and tolerated the treatment well. Her recent CT scan of the chest, abdomen and pelvis showed some improvement in the left upper lobe lung mass but there is still concern about pericardial invasion. I personally and independently reviewed the scan images and discussed the result and showed the images to the patient and her boyfriend today.  She is still not a good candidate for surgical resection because of the pericardial invasion. The final pathology report and recommendation from [facility] was consistent with metastatic melanoma and the recommendation is to switch the patient to a  combination immunotherapy with Ipilumumab and nivolumab. The patient underwent treatment with immunotherapy with ipilimumab and nivolumab status post 2 cycles.  Her treatment was on hold for more than 6 weeks secondary to grade 3 hepatic dysfunction.  She had improvement of her liver enzyme after a prolonged treatment with a steroid with a tapering schedule. She resumed her treatment again with single agent nivolumab.  She is status post 33 cycles of treatment. The  patient has been tolerating this treatment well with no concerning adverse effects. I recommended for her to continue her current treatment with nivolumab and she will proceed with cycle #34 today. The patient will come back for follow-up visit in 2 weeks for evaluation before the next cycle of her treatment. She was advised to call immediately if she has any concerning symptoms in the interval. The patient voices understanding of current disease status and treatment options and is in agreement with the current care plan. All questions were answered. The patient knows to call the clinic with any problems, questions or concerns. We can certainly see the patient much sooner if necessary.  Disclaimer: This note was dictated with voice recognition software. Similar sounding words can inadvertently be transcribed and may not be corrected upon review.

## 2020-08-26 DIAGNOSIS — M9903 Segmental and somatic dysfunction of lumbar region: Secondary | ICD-10-CM | POA: Diagnosis not present

## 2020-08-26 DIAGNOSIS — M2560 Stiffness of unspecified joint, not elsewhere classified: Secondary | ICD-10-CM | POA: Diagnosis not present

## 2020-08-26 DIAGNOSIS — M62838 Other muscle spasm: Secondary | ICD-10-CM | POA: Diagnosis not present

## 2020-08-26 NOTE — Progress Notes (Signed)
Leslie [name], Leslie [name], Leslie with large left left upper/left lower lobe mass with a hypermetabolic AP window lymph node as well as solitary metastatic brain lesion diagnosed in May [name].  Biomarker Findings Microsatellite status - MS-Stable Tumor Mutational Burden - TMB-Intermediate (16 Muts/Mb) Genomic Findings For a complete list of the genes assayed, please refer to the Appendix. CD274 (PD-L1) amplification NRAS Q61R PDCD1LG2 (PD-L2) amplification MYC amplification EPHB1 amplification JAK2 amplification RB1 Q93*, G401* TERT promoter -146C>T TP53 C275W  PRIOR THERAPY: 1) left temporal craniotomy with resection of tumor with intraoperative stereotactic guidance for volumetric resection under the care of Dr. Annette Stable on 5/17/[name]. 2) First-line treatment with immunotherapy with Keytruda 200 mg IV every 3 weeks. First dose June 20, [name]. Status post 3 cycles. 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 3) palliative radiotherapy to the left lung mass under the care of Dr. Lisbeth Renshaw completed on 9/11/[name]. 4) Resuming her treatment again with Keytruda 200 mg IV every 3 weeks, first dose 9/26/[name]. Status post 2 cycles  CURRENT THERAPY: Treatment with immunotherapy with ipilimumab 3 mg/KG and nivolumab 1 mg/KG every 3 weeks for the first 4 cycles followed by maintenance nivolumab 240 mg IV every 2 weeks. First dose of this treatment November 13, [name]. Status post33cycles. Ipilimumab was discontinued after cycle #2 for the significant liver dysfunction. Starting from cycle #3 the patient is on treatment with single agent nivolumab.  INTERVAL HISTORY: Leslie [name] 44 y.o. female returns to the clinic  today forafollow-up visit. The patient is feeling well today without any concerning complaints. She is a weight lifter and notes that she went to her chiropractor yesterday for a left sided rib discomfort. She was told she has a left sided intercostal muscle strain. She has been tolerating her treatment with immunotherapy with nivolumab well without any adverse side effects except for milditching.She is getting her routine skin checks performed by her dermatologist every 6 months and is due for a follow up appointment in November 2021. She denies any fever, chills, night sweats, or weight loss. She denies any shortness of breath, cough, or hemoptysis. She sometimes notes a localized chest discomfort in her left chest that is not exertional and resolves spontaneously without any associated symptoms. She denies any chest discomfort at this time. She denies any nausea, vomiting, diarrhea, or constipation. She denies any headache or visual changes. She is due for her next brain MRI on 09/20/20.She is here for evaluation before starting cycle #34  MEDICAL HISTORY: Past Medical History:  Diagnosis Date  . met melanoma to brain and lung dx'd 5/[name]   unknown origin  . Migraines   . Yeast infection     ALLERGIES:  is allergic to diflucan [fluconazole].  MEDICATIONS:  Current Outpatient Medications  Medication Sig Dispense Refill  . AMERICAN GINSENG PO Take 1,500-3,000 mg by mouth daily. Depending on level of fatigue    . Amino Acids (L-CARNITINE) LIQD Take 2,000 mg by mouth daily. Liquid L-Carnitine 2000 mg    . Ascorbic Acid (LIQUID C 500) 500 MG/15ML LIQD Take 1,000 mg by mouth daily.    . Beta Glucan POWD 1 Dose by Does not apply route daily. Beta D Glucan 400 mg    . Calcium-Magnesium (CAL-MAG PO) Take 15 mLs by mouth daily. Marine Based [employer] -  Mag (500 mg Calcium / 250 mg Mag) 15 ml total    . Coenzyme Q10 (COQ-10) 100 MG CAPS Take 1 tablet by mouth daily.    Marland Kitchen CREATINE MONOHYDRATE PO Take 6  g by mouth daily. Creatine MonoHydrate 6 g (on training cycle)    . doxylamine, Sleep, (UNISOM) 25 MG tablet Take 12.5 mg by mouth at bedtime as needed for sleep.    . ferrous sulfate 324 MG TBEC Take 324 mg by mouth 2 (two) times daily.    . Glucosamine-Chondroit-Vit C-Mn (GLUCOSAMINE 1500 COMPLEX PO) Take 1 tablet by mouth daily. Vegan CMO Glucosamine    . Hyaluronic Acid-Vitamin C (HYALURONIC ACID PO) Take 100 mg by mouth daily. Liquid Hyaluronic Acid 100 mg    . Iron-Folic Acid-Vit N17 (IRON FORMULA PO) Take 1 Dose by mouth daily. Iron 50 mg (1/2 before workout and 1/2 afterwards - cycle 1 wk off and on)    . Lysine POWD 1,600 mg by Does not apply route daily. L-Lysine Powder 1600 mg    . Methylsulfonylmethane (MSM) 1000 MG CAPS Take 4 capsules by mouth daily. 4000 mg (1/2 before workout and 1/2 afterwards)    . Multiple Vitamin (MULTIVITAMIN) tablet Take 1 tablet by mouth daily.    . Multiple Vitamins-Minerals (MY-VITALIFE) CAPS Take by mouth.    . Multiple Vitamins-Minerals (ZINC PO) Take 1 tablet by mouth daily. Raw Zinc 30 mg (cycle every other week)    . nivolumab (OPDIVO) 40 MG/4ML SOLN chemo injection Opdivo 40 mg/4 mL intravenous solution  INFUSE 40 MG OVER 30 MINUTE(S) BY INTRAVENOUS ROUTE EVERY 2 WEEKS    . NON FORMULARY Take 1 Dose by mouth daily. Cordyceps 750 mg    . NON FORMULARY Take 1 Dose by mouth daily. Liver Herbal Supplement 675 mg    . NON FORMULARY Take 1 Dose by mouth daily. Reishi Mushroom 500 mg    . NON FORMULARY Take 1 Dose by mouth daily. Curcumin Phytosome (Bioperrine) 3000 mg    . NON FORMULARY Take 1 tablet by mouth daily. Artemisnin 200 mg    . NON FORMULARY Take 1 tablet by mouth daily. Wellness Herbal Tablet as needed (Elderberry, Ashwagandha, etc)    . NON FORMULARY Take 1 tablet by mouth daily. Zinc L Carnosine Complex 75/59 mg (after workout)    . Omega 3-6-9 Fatty Acids (OMEGA-3-6-9 PO) Take 2 capsules by mouth daily. Plant Based Omega 3-6-9 2 capsules =  1825 mg    . OVER THE COUNTER MEDICATION TK 5 MILLILITERS PO QID PRF MOUTH PAIN    . QUERCETIN PO Take 1 tablet by mouth daily. Quercetin 400 mg / Bromelain 85 mg    . RESVERATROL PO Take 1,200 mg by mouth daily. Trans Resveratrol    . SM Omega-3-6-9 Fatty Acids CAPS Take by mouth.    Marland Kitchen VITAMIN A PO Take 1 tablet by mouth daily. 1500 mcg (5000 IU)    . Vitamin D-Vitamin K (VITAMIN K2-VITAMIN D3 PO) Take 1 capsule by mouth daily. 5000 IU + 50 mcg    . vitamin E 180 MG (400 UNITS) capsule Take by mouth.    Marland Kitchen VITAMIN E PO Take 1 tablet by mouth daily. Vegan Vitamin E     No current facility-administered medications for this visit.    SURGICAL HISTORY:  Past Surgical History:  Procedure Laterality Date  . APPLICATION OF CRANIAL NAVIGATION Left 5/17/[name]   Procedure: APPLICATION OF CRANIAL NAVIGATION;  Surgeon: Earnie Larsson, MD;  Location: [facility]  OR;  Service: Neurosurgery;  Laterality: Left;  . CRANIOTOMY Left 5/17/[name]   Procedure: LEFT CRANIOTOMY FOR  TUMOR BRAIN LAB;  Surgeon: Earnie Larsson, MD;  Location: [place];  Service: Neurosurgery;  Laterality: Left;    REVIEW OF SYSTEMS:   Review of Systems  Constitutional: Negative for appetite change, chills, fatigue, fever and unexpected weight change.  HENT: Negative for mouth sores, nosebleeds, sore throat and trouble swallowing.  Eyes: Negative for eye problems and icterus.  Respiratory: Negative for cough, hemoptysis, shortness of breath and wheezing.  Cardiovascular: Negative for chest pain and leg swelling.  Gastrointestinal: Negative for abdominal pain, constipation, diarrhea, nausea and vomiting.  Genitourinary: Negative for bladder incontinence, difficulty urinating, dysuria, frequency and hematuria.  Musculoskeletal: Negative for back pain, gait problem, neck pain and neck stiffness.  Skin:Positive for occasional itching without rash. Neurological: Negative for dizziness, extremity weakness, gait problem, headaches, light-headedness  and seizures.  Hematological: Negative for adenopathy. Does not bruise/bleed easily.  Psychiatric/Behavioral:Positive for insomnia.Negative for confusionanddepression.The patient is not nervous/anxious.      PHYSICAL EXAMINATION:  Blood pressure 112/77, pulse 62, temperature (!) 97 F (36.1 C), temperature source Tympanic, resp. rate 17, height 5' 9" (1.753 m), weight 147 lb 8 oz (66.9 kg), SpO2 100 %, unknown if currently breastfeeding.  ECOG PERFORMANCE STATUS: 0 - Asymptomatic  Physical Exam  Constitutional: Oriented to person, place, and time and well-developed, well-nourished, and in no distress. HENT:  Head: Normocephalic and atraumatic.  Mouth/Throat: Oropharynx is clear and moist. No oropharyngeal exudate.  Eyes: Conjunctivae are normal. Right eye exhibits no discharge. Left eye exhibits no discharge. No scleral icterus.  Neck: Normal range of motion. Neck supple.  Cardiovascular: Normal rate, regular rhythm, normal heart sounds and intact distal pulses.  Pulmonary/Chest: Effort normal and breath sounds normal. No respiratory distress. No wheezes. No rales.  Abdominal: Soft. Bowel sounds are normal. Exhibits no distension and no mass. There is no tenderness.  Musculoskeletal: Normal range of motion. Exhibits no edema.  Lymphadenopathy:  No cervical adenopathy.  Neurological: Alert and oriented to person, place, and time. Exhibits normal muscle tone. Gait normal. Coordination normal.  Skin: Skin is warm and dry. No rash noted. Not diaphoretic. No erythema. No pallor.  Psychiatric: Mood, memory and judgment normal.  Vitals reviewed.  LABORATORY DATA: Lab Results  Component Value Date   WBC 3.7 (L) 08/27/2020   HGB 13.6 08/27/2020   HCT 40.8 08/27/2020   MCV 93.4 08/27/2020   PLT 234 08/27/2020      Chemistry      Component Value Date/Time   NA 138 08/27/2020 0809   K 4.5 08/27/2020 0809   CL 104 08/27/2020 0809   CO2 27 08/27/2020 0809   BUN 17 08/27/2020  0809   CREATININE 1.01 (H) 08/27/2020 0809      Component Value Date/Time   CALCIUM 9.1 08/27/2020 0809   ALKPHOS 84 08/27/2020 0809   AST 32 08/27/2020 0809   ALT 22 08/27/2020 0809   BILITOT 0.3 08/27/2020 0809       RADIOGRAPHIC STUDIES:  No results found.   ASSESSMENT/PLAN:  This is a very pleasant 44 year old Caucasian female who Leslie with a large mass in theleftupper/lower lobe and mediastinal adenopathy as well as a solitary brain metastasis highly suspicious for malignant melanoma.The final pathology report from [facility] malignancy was consistent with metastatic melanoma.  She is status post a left temporal craniotomy and resection of the tumor on 5/17/[name]. The patient also completed stereotactic radiotherapy to  the resection cavity under the care of Dr. Lisbeth Renshaw. She previously underwent treatment with immunotherapy with Keytruda 200 mg IV every 3 weeks. She is status post 3 cycles. CT scanafter cycle #3 showed enlargement of the left upper lobe lung mass. This was suspicious for pseudo-progression on immunotherapy. The patient was started on palliative course of radiotherapy to the left upper lobe mass and she tolerated this treatment fairly well. She was seen by Dr. Gavin Potters and she recommended for the patient to resume her treatment with [facility] until she undergoes further molecular studies. The patient was treated with 2 more cycles of Keytruda and tolerated the treatment well. The patient then had a CT scan performed which showed pericardial invasion. Therefore,the patient is not a good candidate for surgical resection.  From her final pathology report and recommendations from [facility], the patient was switched to immunotherapy withipilimumaband nivolumab. She isstatus post 2 cycles.Treatment was on hold for more than 6 weeks secondary to grade 3 hepatic dysfunction. She had improvement  of her liver enzymes after a prolonged treatment with a tapering scheduleof steroids.  Sheiscurrently undergoing single agent nivolumab 240 mgIV every 2 weeks. She is statuspost an additional33cycles with nivolumab240 mg IV every 2 weeks. She has beentoleratingtreatment well   Labs were reviewed. Recommend that she proceed with cycle #34today as scheduled.  We will see her back for follow-up visit in 2 weeks for evaluation before starting cycle #35.  The patient opted to have her PET/CT performed locally as opposed to having it performed at [facility] due to the distance. She will let us know once she cancels her scan at [facility] so it can be reordered locally. The scan at [facility] will have to be cancelled due to insurance before it can be rescheduled here.   She will have her brain MRI on 09/20/20 as scheduled at [facility].   I strongly encouraged the patient to see a Leslie. Recommendations were given.   The patient was advised to call immediately if she has any concerning symptoms in the interval. The patient voices understanding of current disease status and treatment options and is in agreement with the current care plan. All questions were answered. The patient knows to call the clinic with any problems, questions or concerns. We can certainly see the patient much sooner if necessary         No orders of the defined types were placed in this encounter.    Cassandra L Heilingoetter, PA-C 08/27/20

## 2020-08-27 ENCOUNTER — Encounter: Payer: Self-pay | Admitting: Physician Assistant

## 2020-08-27 ENCOUNTER — Inpatient Hospital Stay: Payer: BC Managed Care – PPO | Attending: Physician Assistant | Admitting: Physician Assistant

## 2020-08-27 ENCOUNTER — Inpatient Hospital Stay: Payer: BC Managed Care – PPO

## 2020-08-27 ENCOUNTER — Other Ambulatory Visit: Payer: Self-pay

## 2020-08-27 VITALS — BP 112/77 | HR 62 | Temp 97.0°F | Resp 17 | Ht 69.0 in | Wt 147.5 lb

## 2020-08-27 DIAGNOSIS — Z79899 Other long term (current) drug therapy: Secondary | ICD-10-CM | POA: Insufficient documentation

## 2020-08-27 DIAGNOSIS — Z923 Personal history of irradiation: Secondary | ICD-10-CM | POA: Diagnosis not present

## 2020-08-27 DIAGNOSIS — C7800 Secondary malignant neoplasm of unspecified lung: Secondary | ICD-10-CM | POA: Diagnosis not present

## 2020-08-27 DIAGNOSIS — Z23 Encounter for immunization: Secondary | ICD-10-CM | POA: Diagnosis not present

## 2020-08-27 DIAGNOSIS — C7802 Secondary malignant neoplasm of left lung: Secondary | ICD-10-CM | POA: Diagnosis not present

## 2020-08-27 DIAGNOSIS — C7931 Secondary malignant neoplasm of brain: Secondary | ICD-10-CM | POA: Diagnosis not present

## 2020-08-27 DIAGNOSIS — C439 Malignant melanoma of skin, unspecified: Secondary | ICD-10-CM | POA: Insufficient documentation

## 2020-08-27 DIAGNOSIS — Z5112 Encounter for antineoplastic immunotherapy: Secondary | ICD-10-CM

## 2020-08-27 LAB — CMP (CANCER CENTER ONLY)
ALT: 22 U/L (ref 0–44)
AST: 32 U/L (ref 15–41)
Albumin: 3.9 g/dL (ref 3.5–5.0)
Alkaline Phosphatase: 84 U/L (ref 38–126)
Anion gap: 7 (ref 5–15)
BUN: 17 mg/dL (ref 6–20)
CO2: 27 mmol/L (ref 22–32)
Calcium: 9.1 mg/dL (ref 8.9–10.3)
Chloride: 104 mmol/L (ref 98–111)
Creatinine: 1.01 mg/dL — ABNORMAL HIGH (ref 0.44–1.00)
GFR, Est AFR Am: >60 mL/min (ref >60)
GFR, Estimated: >60 mL/min (ref >60)
Glucose, Bld: 67 mg/dL — ABNORMAL LOW (ref 70–99)
Potassium: 4.5 mmol/L (ref 3.5–5.1)
Sodium: 138 mmol/L (ref 135–145)
Total Bilirubin: 0.3 mg/dL (ref 0.3–1.2)
Total Protein: 6.7 g/dL (ref 6.5–8.1)

## 2020-08-27 LAB — CBC WITH DIFFERENTIAL (CANCER CENTER ONLY)
Abs Immature Granulocytes: 0.01 10*3/uL (ref 0.00–0.07)
Basophils Absolute: 0.1 10*3/uL (ref 0.0–0.1)
Basophils Relative: 1 %
Eosinophils Absolute: 0.2 10*3/uL (ref 0.0–0.5)
Eosinophils Relative: 6 %
HCT: 40.8 % (ref 36.0–46.0)
Hemoglobin: 13.6 g/dL (ref 12.0–15.0)
Immature Granulocytes: 0 %
Lymphocytes Relative: 22 %
Lymphs Abs: 0.8 10*3/uL (ref 0.7–4.0)
MCH: 31.1 pg (ref 26.0–34.0)
MCHC: 33.3 g/dL (ref 30.0–36.0)
MCV: 93.4 fL (ref 80.0–100.0)
Monocytes Absolute: 0.4 10*3/uL (ref 0.1–1.0)
Monocytes Relative: 11 %
Neutro Abs: 2.2 10*3/uL (ref 1.7–7.7)
Neutrophils Relative %: 60 %
Platelet Count: 234 10*3/uL (ref 150–400)
RBC: 4.37 MIL/uL (ref 3.87–5.11)
RDW: 12.8 % (ref 11.5–15.5)
WBC Count: 3.7 10*3/uL — ABNORMAL LOW (ref 4.0–10.5)
nRBC: 0 % (ref 0.0–0.2)

## 2020-08-27 MED ORDER — SODIUM CHLORIDE 0.9 % IV SOLN
240.0000 mg | Freq: Once | INTRAVENOUS | Status: AC
  Administered 2020-08-27: 240 mg via INTRAVENOUS
  Filled 2020-08-27: qty 24

## 2020-08-27 MED ORDER — SODIUM CHLORIDE 0.9 % IV SOLN
Freq: Once | INTRAVENOUS | Status: AC
  Filled 2020-08-27: qty 250

## 2020-08-27 NOTE — Patient Instructions (Signed)
[place] Discharge Instructions for Patients Receiving Chemotherapy  Today you received the following chemotherapy agents: Opdivo  To help prevent nausea and vomiting after your treatment, we encourage you to take your nausea medication as directed.    If you develop nausea and vomiting that is not controlled by your nausea medication, call the clinic.   BELOW ARE SYMPTOMS THAT SHOULD BE REPORTED IMMEDIATELY:  *FEVER GREATER THAN 100.5 F  *CHILLS WITH OR WITHOUT FEVER  NAUSEA AND VOMITING THAT IS NOT CONTROLLED WITH YOUR NAUSEA MEDICATION  *UNUSUAL SHORTNESS OF BREATH  *UNUSUAL BRUISING OR BLEEDING  TENDERNESS IN MOUTH AND THROAT WITH OR WITHOUT PRESENCE OF ULCERS  *URINARY PROBLEMS  *BOWEL PROBLEMS  UNUSUAL RASH Items with * indicate a potential emergency and should be followed up as soon as possible.  Feel free to call the clinic should you have any questions or concerns. The clinic phone number is [phone].  Please show the [place] at check-in to the Emergency Department and triage nurse.

## 2020-09-04 DIAGNOSIS — F419 Anxiety disorder, unspecified: Secondary | ICD-10-CM | POA: Diagnosis not present

## 2020-09-06 ENCOUNTER — Encounter: Payer: Self-pay | Admitting: Internal Medicine

## 2020-09-10 ENCOUNTER — Inpatient Hospital Stay: Payer: BC Managed Care – PPO

## 2020-09-10 ENCOUNTER — Other Ambulatory Visit: Payer: Self-pay

## 2020-09-10 ENCOUNTER — Encounter: Payer: Self-pay | Admitting: Internal Medicine

## 2020-09-10 ENCOUNTER — Inpatient Hospital Stay (HOSPITAL_BASED_OUTPATIENT_CLINIC_OR_DEPARTMENT_OTHER): Payer: BC Managed Care – PPO | Admitting: Internal Medicine

## 2020-09-10 VITALS — BP 115/74 | HR 63 | Temp 96.9°F | Resp 17 | Ht 69.0 in | Wt 146.3 lb

## 2020-09-10 DIAGNOSIS — C7800 Secondary malignant neoplasm of unspecified lung: Secondary | ICD-10-CM | POA: Diagnosis not present

## 2020-09-10 DIAGNOSIS — C7802 Secondary malignant neoplasm of left lung: Secondary | ICD-10-CM

## 2020-09-10 DIAGNOSIS — Z23 Encounter for immunization: Secondary | ICD-10-CM

## 2020-09-10 DIAGNOSIS — Z5112 Encounter for antineoplastic immunotherapy: Secondary | ICD-10-CM

## 2020-09-10 DIAGNOSIS — C439 Malignant melanoma of skin, unspecified: Secondary | ICD-10-CM | POA: Diagnosis not present

## 2020-09-10 DIAGNOSIS — Z923 Personal history of irradiation: Secondary | ICD-10-CM | POA: Diagnosis not present

## 2020-09-10 DIAGNOSIS — C7931 Secondary malignant neoplasm of brain: Secondary | ICD-10-CM

## 2020-09-10 DIAGNOSIS — C3432 Malignant neoplasm of lower lobe, left bronchus or lung: Secondary | ICD-10-CM

## 2020-09-10 DIAGNOSIS — Z79899 Other long term (current) drug therapy: Secondary | ICD-10-CM | POA: Diagnosis not present

## 2020-09-10 LAB — CBC WITH DIFFERENTIAL (CANCER CENTER ONLY)
Abs Immature Granulocytes: 0 10*3/uL (ref 0.00–0.07)
Basophils Absolute: 0 10*3/uL (ref 0.0–0.1)
Basophils Relative: 1 %
Eosinophils Absolute: 0.2 10*3/uL (ref 0.0–0.5)
Eosinophils Relative: 5 %
HCT: 40.2 % (ref 36.0–46.0)
Hemoglobin: 13.8 g/dL (ref 12.0–15.0)
Immature Granulocytes: 0 %
Lymphocytes Relative: 22 %
Lymphs Abs: 0.8 10*3/uL (ref 0.7–4.0)
MCH: 31.7 pg (ref 26.0–34.0)
MCHC: 34.3 g/dL (ref 30.0–36.0)
MCV: 92.2 fL (ref 80.0–100.0)
Monocytes Absolute: 0.4 10*3/uL (ref 0.1–1.0)
Monocytes Relative: 12 %
Neutro Abs: 2.1 10*3/uL (ref 1.7–7.7)
Neutrophils Relative %: 60 %
Platelet Count: 214 10*3/uL (ref 150–400)
RBC: 4.36 MIL/uL (ref 3.87–5.11)
RDW: 12.9 % (ref 11.5–15.5)
WBC Count: 3.6 10*3/uL — ABNORMAL LOW (ref 4.0–10.5)
nRBC: 0 % (ref 0.0–0.2)

## 2020-09-10 LAB — CMP (CANCER CENTER ONLY)
ALT: 21 U/L (ref 0–44)
AST: 25 U/L (ref 15–41)
Albumin: 3.9 g/dL (ref 3.5–5.0)
Alkaline Phosphatase: 71 U/L (ref 38–126)
Anion gap: 6 (ref 5–15)
BUN: 9 mg/dL (ref 6–20)
CO2: 23 mmol/L (ref 22–32)
Calcium: 9.2 mg/dL (ref 8.9–10.3)
Chloride: 111 mmol/L (ref 98–111)
Creatinine: 0.98 mg/dL (ref 0.44–1.00)
GFR, Est AFR Am: >60 mL/min (ref >60)
GFR, Estimated: >60 mL/min (ref >60)
Glucose, Bld: 76 mg/dL (ref 70–99)
Potassium: 4.3 mmol/L (ref 3.5–5.1)
Sodium: 140 mmol/L (ref 135–145)
Total Bilirubin: 0.3 mg/dL (ref 0.3–1.2)
Total Protein: 6.7 g/dL (ref 6.5–8.1)

## 2020-09-10 LAB — TSH: TSH: 2.718 u[IU]/mL (ref 0.308–3.960)

## 2020-09-10 MED ORDER — INFLUENZA VAC SPLIT QUAD 0.5 ML IM SUSY
0.5000 mL | PREFILLED_SYRINGE | Freq: Once | INTRAMUSCULAR | Status: AC
  Administered 2020-09-10: 0.5 mL via INTRAMUSCULAR

## 2020-09-10 MED ORDER — SODIUM CHLORIDE 0.9 % IV SOLN
Freq: Once | INTRAVENOUS | Status: AC
  Filled 2020-09-10: qty 250

## 2020-09-10 MED ORDER — SODIUM CHLORIDE 0.9 % IV SOLN
240.0000 mg | Freq: Once | INTRAVENOUS | Status: AC
  Administered 2020-09-10: 240 mg via INTRAVENOUS
  Filled 2020-09-10: qty 24

## 2020-09-10 MED ORDER — INFLUENZA VAC SPLIT QUAD 0.5 ML IM SUSY: 0.5000 mL | PREFILLED_SYRINGE | Freq: Once | INTRAMUSCULAR | Status: DC

## 2020-09-10 MED ORDER — INFLUENZA VAC SPLIT QUAD 0.5 ML IM SUSY
PREFILLED_SYRINGE | INTRAMUSCULAR | Status: AC
  Filled 2020-09-10: qty 0.5

## 2020-09-10 NOTE — Patient Instructions (Signed)
[place] Discharge Instructions for Patients Receiving Chemotherapy  Today you received the following chemotherapy agents Opdivo  To help prevent nausea and vomiting after your treatment, we encourage you to take your nausea medication as directed.    If you develop nausea and vomiting that is not controlled by your nausea medication, call the clinic.   BELOW ARE SYMPTOMS THAT SHOULD BE REPORTED IMMEDIATELY:  *FEVER GREATER THAN 100.5 F  *CHILLS WITH OR WITHOUT FEVER  NAUSEA AND VOMITING THAT IS NOT CONTROLLED WITH YOUR NAUSEA MEDICATION  *UNUSUAL SHORTNESS OF BREATH  *UNUSUAL BRUISING OR BLEEDING  TENDERNESS IN MOUTH AND THROAT WITH OR WITHOUT PRESENCE OF ULCERS  *URINARY PROBLEMS  *BOWEL PROBLEMS  UNUSUAL RASH Items with * indicate a potential emergency and should be followed up as soon as possible.  Feel free to call the clinic should you have any questions or concerns. The clinic phone number is [phone].  Please show the [place] at check-in to the Emergency Department and triage nurse.

## 2020-09-10 NOTE — Progress Notes (Signed)
[place] Telephone:(336) [phone]   Fax:(336) [phone]  OFFICE PROGRESS NOTE  Pcp, No No address on file  DIAGNOSIS: Metastatic high-grade neoplasm consistent with metastatic malignant melanoma based on the most recent pathology report from [facility] in October 2019, presented with large left left upper/left lower lobe mass with a hypermetabolic AP window lymph node as well as solitary metastatic brain lesion diagnosed in May 2019.  Biomarker Findings Microsatellite status - MS-Stable Tumor Mutational Burden - TMB-Intermediate (16 Muts/Mb) Genomic Findings For a complete list of the genes assayed, please refer to the Appendix. CD274 (PD-L1) amplification NRAS Q61R PDCD1LG2 (PD-L2) amplification MYC amplification EPHB1 amplification JAK2 amplification RB1 Q93*, L572* TERT promoter -146C>T TP53 C275W   PRIOR THERAPY: 1) left temporal craniotomy with resection of tumor with intraoperative stereotactic guidance for volumetric resection under the care of Dr. Annette Stable on 04/29/2018. 2) First-line treatment with immunotherapy with Keytruda 200 mg IV every 3 weeks.  First dose June 02, 2018.  Status post 3 cycles.  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 3) palliative radiotherapy to the left lung mass under the care of Dr. Lisbeth Renshaw completed on 08/24/2018. 4) Resuming her treatment again with Keytruda 200 mg IV every 3 weeks, first dose 09/08/2018.  Status post 2 cycles.  CURRENT THERAPY: Treatment with immunotherapy with ipilimumab 3 mg/KG and nivolumab 1 mg/KG every 3 weeks for the first 4 cycles followed by maintenance nivolumab 240 mg IV every 2 weeks.  First dose of this treatment October 26, 2018.  Status post 35 cycles.  Ipilimumab was discontinued after cycle #2 for the significant liver dysfunction.  Starting from cycle #3 the patient is on treatment with single agent nivolumab.  INTERVAL HISTORY: Leslie [name] 44 y.o. female returns to the clinic today for follow-up visit.  The patient is feeling fine today with no concerning complaints.  She denied having any chest pain, shortness of breath, cough or hemoptysis.  She pulled a muscle on the left side of the chest.  The patient denied having any nausea, vomiting, diarrhea or constipation.  She denied having any headache or visual changes.  She continues to tolerate her treatment with nivolumab fairly well.  She is here today for evaluation before starting cycle #36.   MEDICAL HISTORY: Past Medical History:  Diagnosis Date  . 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 Migraines   . Yeast infection     ALLERGIES:  is allergic to diflucan [fluconazole].  MEDICATIONS:  Current Outpatient Medications  Medication Sig Dispense Refill  . AMERICAN GINSENG PO Take 1,500-3,000 mg by mouth daily. Depending on level of fatigue    . Amino Acids (L-CARNITINE) LIQD Take 2,000 mg by mouth daily. Liquid L-Carnitine 2000 mg    . Ascorbic Acid (LIQUID C 500) 500 MG/15ML LIQD Take 1,000 mg by mouth daily.    . Beta Glucan POWD 1 Dose by Does not apply route daily. Beta D Glucan 400 mg    . Calcium-Magnesium (CAL-MAG PO) Take 15 mLs by mouth daily. Marine Based Cal - Mag (500 mg Calcium / 250 mg Mag) 15 ml total    . Coenzyme Q10 (COQ-10) 100 MG CAPS Take 1 tablet by mouth daily.    Marland Kitchen CREATINE MONOHYDRATE PO Take 6 g by mouth daily. Creatine MonoHydrate 6 g (on training cycle)    . doxylamine, Sleep, (UNISOM) 25 MG tablet Take 12.5 mg by mouth at bedtime as  needed for sleep.    . ferrous sulfate 324 MG TBEC Take 324 mg by mouth 2 (two) times daily.    . Glucosamine-Chondroit-Vit C-Mn (GLUCOSAMINE 1500 COMPLEX PO) Take 1 tablet by mouth daily. Vegan CMO Glucosamine    . Hyaluronic Acid-Vitamin C (HYALURONIC ACID PO) Take 100 mg by mouth daily. Liquid Hyaluronic Acid 100 mg    . Iron-Folic Acid-Vit T59 (IRON FORMULA PO) Take 1 Dose by  mouth daily. Iron 50 mg (1/2 before workout and 1/2 afterwards - cycle 1 wk off and on)    . Lysine POWD 1,600 mg by Does not apply route daily. L-Lysine Powder 1600 mg    . Methylsulfonylmethane (MSM) 1000 MG CAPS Take 4 capsules by mouth daily. 4000 mg (1/2 before workout and 1/2 afterwards)    . Multiple Vitamin (MULTIVITAMIN) tablet Take 1 tablet by mouth daily.    . Multiple Vitamins-Minerals (MY-VITALIFE) CAPS Take by mouth.    . Multiple Vitamins-Minerals (ZINC PO) Take 1 tablet by mouth daily. Raw Zinc 30 mg (cycle every other week)    . nivolumab (OPDIVO) 40 MG/4ML SOLN chemo injection Opdivo 40 mg/4 mL intravenous solution  INFUSE 40 MG OVER 30 MINUTE(S) BY INTRAVENOUS ROUTE EVERY 2 WEEKS    . NON FORMULARY Take 1 Dose by mouth daily. Cordyceps 750 mg    . NON FORMULARY Take 1 Dose by mouth daily. Liver Herbal Supplement 675 mg    . NON FORMULARY Take 1 Dose by mouth daily. Reishi Mushroom 500 mg    . NON FORMULARY Take 1 Dose by mouth daily. Curcumin Phytosome (Bioperrine) 3000 mg    . NON FORMULARY Take 1 tablet by mouth daily. Artemisnin 200 mg    . NON FORMULARY Take 1 tablet by mouth daily. Wellness Herbal Tablet as needed (Elderberry, Ashwagandha, etc)    . NON FORMULARY Take 1 tablet by mouth daily. Zinc L Carnosine Complex 75/59 mg (after workout)    . Omega 3-6-9 Fatty Acids (OMEGA-3-6-9 PO) Take 2 capsules by mouth daily. Plant Based Omega 3-6-9 2 capsules = 1825 mg    . OVER THE COUNTER MEDICATION TK 5 MILLILITERS PO QID PRF MOUTH PAIN    . QUERCETIN PO Take 1 tablet by mouth daily. Quercetin 400 mg / Bromelain 85 mg    . RESVERATROL PO Take 1,200 mg by mouth daily. Trans Resveratrol    . SM Omega-3-6-9 Fatty Acids CAPS Take by mouth.    Marland Kitchen VITAMIN A PO Take 1 tablet by mouth daily. 1500 mcg (5000 IU)    . Vitamin D-Vitamin K (VITAMIN K2-VITAMIN D3 PO) Take 1 capsule by mouth daily. 5000 IU + 50 mcg    . vitamin E 180 MG (400 UNITS) capsule Take by mouth.    Marland Kitchen VITAMIN E PO  Take 1 tablet by mouth daily. Vegan Vitamin E     No current facility-administered medications for this visit.    SURGICAL HISTORY:  Past Surgical History:  Procedure Laterality Date  . APPLICATION OF CRANIAL NAVIGATION Left 04/29/2018   Procedure: APPLICATION OF CRANIAL NAVIGATION;  Surgeon: Earnie Larsson, MD;  Location: [place];  Service: Neurosurgery;  Laterality: Left;  . CRANIOTOMY Left 04/29/2018   Procedure: LEFT CRANIOTOMY FOR  TUMOR BRAIN LAB;  Surgeon: Earnie Larsson, MD;  Location: [place];  Service: Neurosurgery;  Laterality: Left;    REVIEW OF SYSTEMS:  A comprehensive review of systems was negative.   PHYSICAL EXAMINATION: General appearance: alert, cooperative and no distress Head: Normocephalic, without  obvious abnormality, atraumatic Neck: no adenopathy, no JVD, supple, symmetrical, trachea midline and thyroid not enlarged, symmetric, no tenderness/mass/nodules Lymph nodes: Cervical, supraclavicular, and axillary nodes normal. Resp: clear to auscultation bilaterally Back: negative, symmetric, no curvature. ROM normal. No CVA tenderness. Cardio: regular rate and rhythm, S1, S2 normal, no murmur, click, rub or gallop GI: soft, non-tender; bowel sounds normal; no masses,  no organomegaly Extremities: extremities normal, atraumatic, no cyanosis or edema  ECOG PERFORMANCE STATUS: 0 - Asymptomatic  Blood pressure 115/74, pulse 63, temperature (!) 96.9 F (36.1 C), temperature source Tympanic, resp. rate 17, height 5' 9"  (1.753 m), weight 146 lb 4.8 oz (66.4 kg), SpO2 100 %, unknown if currently breastfeeding.  LABORATORY DATA: Lab Results  Component Value Date   WBC 3.6 (L) 09/10/2020   HGB 13.8 09/10/2020   HCT 40.2 09/10/2020   MCV 92.2 09/10/2020   PLT 214 09/10/2020      Chemistry      Component Value Date/Time   NA 138 08/27/2020 0809   K 4.5 08/27/2020 0809   CL 104 08/27/2020 0809   CO2 27 08/27/2020 0809   BUN 17 08/27/2020 0809   CREATININE 1.01 (H)  08/27/2020 0809      Component Value Date/Time   CALCIUM 9.1 08/27/2020 0809   ALKPHOS 84 08/27/2020 0809   AST 32 08/27/2020 0809   ALT 22 08/27/2020 0809   BILITOT 0.3 08/27/2020 0809       RADIOGRAPHIC STUDIES: No results found.  ASSESSMENT AND PLAN: This is a very pleasant 44 years old white female with highly suspicious metastatic malignant melanoma presented with large mass in the left upper/left lower lobe and mediastinal lymphadenopathy as well as solitary brain metastasis status post left temporal craniotomy and resection of tumor on 04/29/2018 and she is recovering well from her surgery. The patient completed stereotactic radiotherapy to the resection cavity next week under the care of Dr. Lisbeth Renshaw. The patient underwent treatment with immunotherapy with Keytruda 200 mg IV every 3 weeks status post 3 cycles.   Her scan after cycle #3 showed enlargement of the left upper lobe lung mass.  This was also suspicious for pseudo-progression on immunotherapy.  The patient was started on a palliative course of radiotherapy to the left upper lobe lung mass and she tolerated this treatment fairly well.  She was seen by Dr. Emelda Brothers at [facility] and she recommended for the patient to resume her treatment with [facility] for now until she undergoes further molecular studies.  The patient was treated with 2 more cycles of Keytruda and tolerated the treatment well. Her recent CT scan of the chest, abdomen and pelvis showed some improvement in the left upper lobe lung mass but there is still concern about pericardial invasion. I personally and independently reviewed the scan images and discussed the result and showed the images to the patient and her boyfriend today.  She is still not a good candidate for surgical resection because of the pericardial invasion. The final pathology report and recommendation from [facility] was consistent with metastatic melanoma and the  recommendation is to switch the patient to a combination immunotherapy with Ipilumumab and nivolumab. The patient underwent treatment with immunotherapy with ipilimumab and nivolumab status post 2 cycles.  Her treatment was on hold for more than 6 weeks secondary to grade 3 hepatic dysfunction.  She had improvement of her liver enzyme after a prolonged treatment with a steroid with a tapering schedule. She resumed her treatment again with single agent  nivolumab.  She is status post 35 cycles of treatment. She has been tolerating this treatment well with no concerning adverse effects. I recommended for her to proceed with cycle #36 today as planned. I will see her back for follow-up visit in 2 weeks for evaluation before the next cycle of her treatment. The patient was advised to call immediately if she has any concerning symptoms in the interval. The patient voices understanding of current disease status and treatment options and is in agreement with the current care plan. All questions were answered. The patient knows to call the clinic with any problems, questions or concerns. We can certainly see the patient much sooner if necessary.  Disclaimer: This note was dictated with voice recognition software. Similar sounding words can inadvertently be transcribed and may not be corrected upon review.

## 2020-09-13 ENCOUNTER — Encounter: Payer: Self-pay | Admitting: Internal Medicine

## 2020-09-18 DIAGNOSIS — M2560 Stiffness of unspecified joint, not elsewhere classified: Secondary | ICD-10-CM | POA: Diagnosis not present

## 2020-09-18 DIAGNOSIS — M9903 Segmental and somatic dysfunction of lumbar region: Secondary | ICD-10-CM | POA: Diagnosis not present

## 2020-09-18 DIAGNOSIS — M62838 Other muscle spasm: Secondary | ICD-10-CM | POA: Diagnosis not present

## 2020-09-20 DIAGNOSIS — C7931 Secondary malignant neoplasm of brain: Secondary | ICD-10-CM | POA: Diagnosis not present

## 2020-09-20 DIAGNOSIS — Z6821 Body mass index (BMI) 21.0-21.9, adult: Secondary | ICD-10-CM | POA: Diagnosis not present

## 2020-09-20 DIAGNOSIS — C439 Malignant melanoma of skin, unspecified: Secondary | ICD-10-CM | POA: Diagnosis not present

## 2020-09-20 DIAGNOSIS — C792 Secondary malignant neoplasm of skin: Secondary | ICD-10-CM | POA: Diagnosis not present

## 2020-09-23 ENCOUNTER — Other Ambulatory Visit: Payer: Self-pay

## 2020-09-23 ENCOUNTER — Other Ambulatory Visit: Payer: Self-pay | Admitting: *Deleted

## 2020-09-23 ENCOUNTER — Inpatient Hospital Stay: Payer: BC Managed Care – PPO

## 2020-09-23 ENCOUNTER — Ambulatory Visit
Admission: RE | Admit: 2020-09-23 | Discharge: 2020-09-23 | Disposition: A | Discharge status: Home / Self Care | Payer: Self-pay | Source: Ambulatory Visit | Attending: Internal Medicine | Admitting: Internal Medicine

## 2020-09-23 ENCOUNTER — Encounter: Payer: Self-pay | Admitting: Internal Medicine

## 2020-09-23 ENCOUNTER — Inpatient Hospital Stay: Payer: BC Managed Care – PPO | Attending: Physician Assistant | Admitting: Internal Medicine

## 2020-09-23 ENCOUNTER — Inpatient Hospital Stay (HOSPITAL_BASED_OUTPATIENT_CLINIC_OR_DEPARTMENT_OTHER): Payer: BC Managed Care – PPO | Admitting: Internal Medicine

## 2020-09-23 VITALS — BP 116/82 | HR 55 | Temp 97.6°F | Resp 18 | Ht 69.0 in | Wt 147.0 lb

## 2020-09-23 VITALS — BP 126/81 | HR 67 | Temp 97.0°F | Resp 18 | Ht 69.0 in | Wt 147.5 lb

## 2020-09-23 DIAGNOSIS — C3432 Malignant neoplasm of lower lobe, left bronchus or lung: Secondary | ICD-10-CM

## 2020-09-23 DIAGNOSIS — C7931 Secondary malignant neoplasm of brain: Secondary | ICD-10-CM

## 2020-09-23 DIAGNOSIS — Z923 Personal history of irradiation: Secondary | ICD-10-CM | POA: Insufficient documentation

## 2020-09-23 DIAGNOSIS — C7802 Secondary malignant neoplasm of left lung: Secondary | ICD-10-CM

## 2020-09-23 DIAGNOSIS — Z5112 Encounter for antineoplastic immunotherapy: Secondary | ICD-10-CM

## 2020-09-23 DIAGNOSIS — C439 Malignant melanoma of skin, unspecified: Secondary | ICD-10-CM | POA: Insufficient documentation

## 2020-09-23 LAB — CMP (CANCER CENTER ONLY)
ALT: 26 U/L (ref 0–44)
AST: 36 U/L (ref 15–41)
Albumin: 4.2 g/dL (ref 3.5–5.0)
Alkaline Phosphatase: 75 U/L (ref 38–126)
Anion gap: 5 (ref 5–15)
BUN: 16 mg/dL (ref 6–20)
CO2: 29 mmol/L (ref 22–32)
Calcium: 9.4 mg/dL (ref 8.9–10.3)
Chloride: 106 mmol/L (ref 98–111)
Creatinine: 1.03 mg/dL — ABNORMAL HIGH (ref 0.44–1.00)
GFR, Estimated: >60 mL/min (ref >60)
Glucose, Bld: 77 mg/dL (ref 70–99)
Potassium: 4.2 mmol/L (ref 3.5–5.1)
Sodium: 140 mmol/L (ref 135–145)
Total Bilirubin: <0.2 mg/dL — ABNORMAL LOW (ref 0.3–1.2)
Total Protein: 7 g/dL (ref 6.5–8.1)

## 2020-09-23 LAB — CBC WITH DIFFERENTIAL (CANCER CENTER ONLY)
Abs Immature Granulocytes: 0.01 10*3/uL (ref 0.00–0.07)
Basophils Absolute: 0 10*3/uL (ref 0.0–0.1)
Basophils Relative: 1 %
Eosinophils Absolute: 0.2 10*3/uL (ref 0.0–0.5)
Eosinophils Relative: 4 %
HCT: 42.6 % (ref 36.0–46.0)
Hemoglobin: 13.8 g/dL (ref 12.0–15.0)
Immature Granulocytes: 0 %
Lymphocytes Relative: 22 %
Lymphs Abs: 0.9 10*3/uL (ref 0.7–4.0)
MCH: 30.3 pg (ref 26.0–34.0)
MCHC: 32.4 g/dL (ref 30.0–36.0)
MCV: 93.6 fL (ref 80.0–100.0)
Monocytes Absolute: 0.4 10*3/uL (ref 0.1–1.0)
Monocytes Relative: 11 %
Neutro Abs: 2.6 10*3/uL (ref 1.7–7.7)
Neutrophils Relative %: 62 %
Platelet Count: 245 10*3/uL (ref 150–400)
RBC: 4.55 MIL/uL (ref 3.87–5.11)
RDW: 12.7 % (ref 11.5–15.5)
WBC Count: 4.1 10*3/uL (ref 4.0–10.5)
nRBC: 0 % (ref 0.0–0.2)

## 2020-09-23 MED ORDER — SODIUM CHLORIDE 0.9 % IV SOLN
Freq: Once | INTRAVENOUS | Status: AC
  Filled 2020-09-23: qty 250

## 2020-09-23 MED ORDER — SODIUM CHLORIDE 0.9 % IV SOLN
240.0000 mg | Freq: Once | INTRAVENOUS | Status: AC
  Administered 2020-09-23: 240 mg via INTRAVENOUS
  Filled 2020-09-23: qty 24

## 2020-09-23 NOTE — Progress Notes (Signed)
[facility] [place], [place] [address] [phone]   Interval Evaluation  Date of Service: 09/23/20 Patient Name: Leslie [name] Patient MRN: [id number] Patient DOB: [birth year] Provider: Ventura Sellers, MD  Identifying Statement:  Leslie [name] is a 44 y.o. female with Brain metastasis ([place]) [C79.31]    Primary Cancer:  Oncologic History: Oncology History  Brain metastasis ([place])  05/02/2018 Initial Diagnosis   Brain metastasis ([place])   06/02/2018 - 07/14/2018 Chemotherapy   The patient had pembrolizumab (KEYTRUDA) 200 mg in sodium chloride 0.9 % 50 mL chemo infusion, 200 mg, Intravenous, Once, 3 of 6 cycles Administration: 200 mg (06/02/2018), 200 mg (06/23/2018), 200 mg (07/14/2018)  for chemotherapy treatment.    09/08/2018 - 09/29/2018 Chemotherapy   The patient had pembrolizumab (KEYTRUDA) 200 mg in sodium chloride 0.9 % 50 mL chemo infusion, 200 mg (100 % of original dose 200 mg), Intravenous, Once, 2 of 6 cycles Dose modification: 200 mg (original dose 200 mg, Cycle 1, Reason: Provider Judgment) Administration: 200 mg (09/08/2018), 200 mg (09/29/2018)  for chemotherapy treatment.    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    09/08/2018 - 09/29/2018 Chemotherapy   The patient had pembrolizumab (KEYTRUDA) 200 mg in sodium chloride 0.9 % 50 mL chemo infusion, 200 mg (100 % of original dose 200 mg), Intravenous, Once, 2 of 6 cycles Dose modification: 200 mg (original dose 200 mg, Cycle 1, Reason: Provider Judgment) Administration: 200 mg (09/08/2018), 200 mg (09/29/2018)  for  chemotherapy treatment.    Metastatic melanoma to lung ([place])  10/20/2018 Initial Diagnosis   Metastatic melanoma to lung ([place])   10/26/2018 -  Chemotherapy   The patient had ipilimumab (YERVOY) 200 mg in sodium chloride 0.9 % 100 mL chemo infusion, 3 mg/kg = 200 mg, Intravenous,  Once, 2 of 2 cycles Administration: 200 mg (10/26/2018), 200 mg (11/17/2018) nivolumab (OPDIVO) 67 mg in sodium chloride 0.9 % 50 mL chemo infusion, 1 mg/kg = 67 mg, Intravenous, Once, 36 of 43 cycles Administration: 67 mg (10/26/2018), 240 mg (02/08/2019), 67 mg (11/17/2018), 240 mg (03/01/2019), 240 mg (03/15/2019), 240 mg (03/29/2019), 240 mg (04/24/2019), 240 mg (04/11/2019), 240 mg (05/23/2019), 240 mg (06/06/2019), 240 mg (05/10/2019), 240 mg (06/19/2019), 240 mg (07/04/2019), 240 mg (07/18/2019), 240 mg (08/01/2019), 240 mg (08/15/2019), 240 mg (08/29/2019), 240 mg (09/12/2019), 240 mg (09/26/2019), 240 mg (10/10/2019), 240 mg (10/24/2019), 240 mg (02/06/2020), 240 mg (02/20/2020), 240 mg (03/05/2020), 240 mg (03/19/2020), 240 mg (04/02/2020), 240 mg (04/17/2020), 240 mg (04/29/2020), 240 mg (05/14/2020), 240 mg (05/28/2020), 240 mg (06/18/2020), 240 mg (07/02/2020), 240 mg (07/30/2020), 240 mg (08/13/2020), 240 mg (08/27/2020), 240 mg (09/10/2020)  for chemotherapy treatment.     [facility] Oncologic History 04/29/18: Craniotomy, resection left temporal metastasis 06/01/18: Post-op SRS // 27 Gy in 3 fractions  Interval History:  Leslie [name] presents for follow up after recent MRI brain.  She describes no further progression of fatigue, no motor deficits.  She has been competing recently in weight lifting competitions. Otherwise denies headaches, seizures.  Continues on nivolumab without issue.  Medications: Current Outpatient Medications on File Prior to Visit  Medication Sig Dispense  Refill  . AMERICAN GINSENG PO Take 1,500-3,000 mg by mouth daily. Depending on level of fatigue    . Amino Acids (L-CARNITINE) LIQD Take 2,000 mg by mouth daily.  Liquid L-Carnitine 2000 mg    . Ascorbic Acid (LIQUID C 500) 500 MG/15ML LIQD Take 1,000 mg by mouth daily.    . Beta Glucan POWD 1 Dose by Does not apply route daily. Beta D Glucan 400 mg    . Calcium-Magnesium (CAL-MAG PO) Take 15 mLs by mouth daily. Marine Based Cal - Mag (500 mg Calcium / 250 mg Mag) 15 ml total    . Coenzyme Q10 (COQ-10) 100 MG CAPS Take 1 tablet by mouth daily.    Marland Kitchen CREATINE MONOHYDRATE PO Take 6 g by mouth daily. Creatine MonoHydrate 6 g (on training cycle)    . doxylamine, Sleep, (UNISOM) 25 MG tablet Take 12.5 mg by mouth at bedtime as needed for sleep.    . ferrous sulfate 324 MG TBEC Take 324 mg by mouth 2 (two) times daily.    . Glucosamine-Chondroit-Vit C-Mn (GLUCOSAMINE 1500 COMPLEX PO) Take 1 tablet by mouth daily. Vegan CMO Glucosamine    . Hyaluronic Acid-Vitamin C (HYALURONIC ACID PO) Take 100 mg by mouth daily. Liquid Hyaluronic Acid 100 mg    . Iron-Folic Acid-Vit H60 (IRON FORMULA PO) Take 1 Dose by mouth daily. Iron 50 mg (1/2 before workout and 1/2 afterwards - cycle 1 wk off and on)    . Lysine POWD 1,600 mg by Does not apply route daily. L-Lysine Powder 1600 mg    . Methylsulfonylmethane (MSM) 1000 MG CAPS Take 4 capsules by mouth daily. 4000 mg (1/2 before workout and 1/2 afterwards)    . Multiple Vitamin (MULTIVITAMIN) tablet Take 1 tablet by mouth daily.    . Multiple Vitamins-Minerals (MY-VITALIFE) CAPS Take by mouth.    . Multiple Vitamins-Minerals (ZINC PO) Take 1 tablet by mouth daily. Raw Zinc 30 mg (cycle every other week)    . nivolumab (OPDIVO) 40 MG/4ML SOLN chemo injection Opdivo 40 mg/4 mL intravenous solution  INFUSE 40 MG OVER 30 MINUTE(S) BY INTRAVENOUS ROUTE EVERY 2 WEEKS    . NON FORMULARY Take 1 Dose by mouth daily. Cordyceps 750 mg    . NON FORMULARY Take 1 Dose by mouth daily. Liver Herbal Supplement 675 mg    . NON FORMULARY Take 1 Dose by mouth daily. Reishi Mushroom 500 mg    . NON FORMULARY Take 1 Dose by mouth daily. Curcumin  Phytosome (Bioperrine) 3000 mg    . NON FORMULARY Take 1 tablet by mouth daily. Artemisnin 200 mg    . NON FORMULARY Take 1 tablet by mouth daily. Wellness Herbal Tablet as needed (Elderberry, Ashwagandha, etc)    . NON FORMULARY Take 1 tablet by mouth daily. Zinc L Carnosine Complex 75/59 mg (after workout)    . Omega 3-6-9 Fatty Acids (OMEGA-3-6-9 PO) Take 2 capsules by mouth daily. Plant Based Omega 3-6-9 2 capsules = 1825 mg    . OVER THE COUNTER MEDICATION TK 5 MILLILITERS PO QID PRF MOUTH PAIN    . QUERCETIN PO Take 1 tablet by mouth daily. Quercetin 400 mg / Bromelain 85 mg    . RESVERATROL PO Take 1,200 mg by mouth daily. Trans Resveratrol    . SM Omega-3-6-9 Fatty Acids CAPS Take by mouth.    Marland Kitchen VITAMIN A PO Take 1 tablet by mouth daily. 1500 mcg (5000 IU)    . Vitamin D-Vitamin K (VITAMIN K2-VITAMIN  D3 PO) Take 1 capsule by mouth daily. 5000 IU + 50 mcg    . vitamin E 180 MG (400 UNITS) capsule Take by mouth.    Marland Kitchen VITAMIN E PO Take 1 tablet by mouth daily. Vegan Vitamin E     No current facility-administered medications on file prior to visit.    Allergies:  Allergies  Allergen Reactions  . Diflucan [Fluconazole] Other (See Comments)    Wheezing and throat pressue   Past Medical History:  Past Medical History:  Diagnosis Date  . 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 Migraines   . Yeast infection    Past Surgical History:  Past Surgical History:  Procedure Laterality Date  . APPLICATION OF CRANIAL NAVIGATION Left 04/29/2018   Procedure: APPLICATION OF CRANIAL NAVIGATION;  Surgeon: Earnie Larsson, MD;  Location: [place];  Service: Neurosurgery;  Laterality: Left;  . CRANIOTOMY Left 04/29/2018   Procedure: LEFT CRANIOTOMY FOR  TUMOR BRAIN LAB;  Surgeon: Earnie Larsson, MD;  Location: [place];  Service: Neurosurgery;  Laterality: Left;   Social History:  Social History   Socioeconomic History  . Marital status: Divorced    Spouse name: Not on file  .  Number of children: Not on file  . Years of education: Not on file  . Highest education level: Not on file  Occupational History  . Not on file  Tobacco Use  . Smoking status: Never Smoker  . Smokeless tobacco: Never Used  Vaping Use  . Vaping Use: Never used  Substance and Sexual Activity  . Alcohol use: Yes  . Drug use: No  . Sexual activity: Yes    Birth control/protection: Condom  Other Topics Concern  . Not on file  Social History Narrative  . Not on file   Social Determinants of Health   Financial Resource Strain:   . Difficulty of Paying Living Expenses: Not on file  Food Insecurity:   . Worried About Charity fundraiser in the Last Year: Not on file  . Ran Out of Food in the Last Year: Not on file  Transportation Needs:   . Lack of Transportation (Medical): Not on file  . Lack of Transportation (Non-Medical): Not on file  Physical Activity:   . Days of Exercise per Week: Not on file  . Minutes of Exercise per Session: Not on file  Stress:   . Feeling of Stress : Not on file  Social Connections:   . Frequency of Communication with Friends and Family: Not on file  . Frequency of Social Gatherings with Friends and Family: Not on file  . Attends Religious Services: Not on file  . Active Member of Clubs or Organizations: Not on file  . Attends Archivist Meetings: Not on file  . Marital Status: Not on file  Intimate Partner Violence:   . Fear of Current or Ex-Partner: Not on file  . Emotionally Abused: Not on file  . Physically Abused: Not on file  . Sexually Abused: Not on file   Family History:  Family History  Problem Relation Age of Onset  . Cancer Father        lung    Review of Systems: Constitutional: Doesn't report fevers, chills or abnormal weight loss Eyes: Doesn't report blurriness of vision Ears, nose, mouth, throat, and face: Doesn't report sore throat Respiratory: Doesn't report cough, dyspnea or wheezes Cardiovascular: Doesn't  report palpitation, chest discomfort  Gastrointestinal:  Doesn't report  nausea, constipation, diarrhea GU: Doesn't report incontinence Skin: Doesn't report skin rashes Neurological: Per HPI Musculoskeletal: Doesn't report joint pain Behavioral/Psych: Doesn't report anxiety  Physical Exam: Vitals:   09/23/20 0907  BP: 126/81  Pulse: 67  Resp: 18  Temp: (!) 97 F (36.1 C)  SpO2: 100%   KPS: 90. General: Alert, cooperative, pleasant, in no acute distress Head: Normal EENT: No conjunctival injection or scleral icterus.  Lungs: Resp effort normal Cardiac: Regular rate Abdomen: Non-distended abdomen Skin: No rashes cyanosis or petechiae. Extremities: No clubbing or edema  Neurologic Exam: Mental Status: Awake, alert, attentive to examiner. Oriented to self and environment. Language is fluent with intact comprehension.  Cranial Nerves: Visual acuity is grossly normal. Visual fields are full. Extra-ocular movements intact. No ptosis. Face is symmetric Motor: Tone and bulk are normal. Power is full in both arms and legs.  Sensory: Intact to light touch Gait: Normal.   Labs: I have reviewed the data as listed    Component Value Date/Time   NA 140 09/10/2020 0825   K 4.3 09/10/2020 0825   CL 111 09/10/2020 0825   CO2 23 09/10/2020 0825   GLUCOSE 76 09/10/2020 0825   BUN 9 09/10/2020 0825   CREATININE 0.98 09/10/2020 0825   CALCIUM 9.2 09/10/2020 0825   PROT 6.7 09/10/2020 0825   ALBUMIN 3.9 09/10/2020 0825   AST 25 09/10/2020 0825   ALT 21 09/10/2020 0825   ALKPHOS 71 09/10/2020 0825   BILITOT 0.3 09/10/2020 0825   GFRNONAA >60 09/10/2020 0825   GFRAA >60 09/10/2020 0825   Lab Results  Component Value Date   WBC 3.6 (L) 09/10/2020   NEUTROABS 2.1 09/10/2020   HGB 13.8 09/10/2020   HCT 40.2 09/10/2020   MCV 92.2 09/10/2020   PLT 214 09/10/2020    Imaging:  MRI brain images from OSH reviewed dated 09/20/20: [facility] Clinician Interpretation: I have personally  reviewed the radiological images as listed.  My interpretation, in the context of the patient's clinical presentation, is stable disease   Assessment/Plan Brain metastasis [facility] [C79.31]  Leslie [name] is clinically and radiographically stable today.  Images from [facility] were received and reviewed in person.  No further suggestion of radionecrosis.  We ask that Leslie [name] return to clinic in 6 months following next brain MRI, or sooner as needed.  We appreciate the opportunity to participate in the care of Leslie [name].    All questions were answered. The patient knows to call the clinic with any problems, questions or concerns. No barriers to learning were detected.  I have spent a total of 30 minutes of face-to-face and non-face-to-face time, excluding clinical staff time, preparing to see patient, ordering tests and/or medications, counseling the patient, and independently interpreting results and communicating results to the patient/family/caregiver    Ventura Sellers, MD Medical Director of Neuro-Oncology [facility] at [place] 09/23/20 8:50 AM

## 2020-09-23 NOTE — Progress Notes (Signed)
[place] [phone]   [phone]  OFFICE PROGRESS NOTE  Pcp, No No address on file  DIAGNOSIS: Metastatic high-grade neoplasm consistent with metastatic malignant melanoma based on the most recent pathology report from [facility] in October 2019, presented with large left left upper/left lower lobe mass with a hypermetabolic AP window lymph node as well as solitary metastatic brain lesion diagnosed in May 2019.  Biomarker Findings Microsatellite status - MS-Stable Tumor Mutational Burden - TMB-Intermediate (16 Muts/Mb) Genomic Findings For a complete list of the genes assayed, please refer to the Appendix. CD274 (PD-L1) amplification NRAS Q61R PDCD1LG2 (PD-L2) amplification MYC amplification EPHB1 amplification JAK2 amplification RB1 Q93*, Y073* TERT promoter -146C>T TP53 C275W   PRIOR THERAPY: 1) left temporal craniotomy with resection of tumor with intraoperative stereotactic guidance for volumetric resection under the care of Dr. Annette Stable on 04/29/2018. 2) First-line treatment with immunotherapy with Keytruda 200 mg IV every 3 weeks.  First dose June 02, 2018.  Status post 3 cycles.  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 3) palliative radiotherapy to the left lung mass under the care of Dr. Lisbeth Renshaw completed on 08/24/2018. 4) Resuming her treatment again with Keytruda 200 mg IV every 3 weeks, first dose 09/08/2018.  Status post 2 cycles.  CURRENT THERAPY: Treatment with immunotherapy with ipilimumab 3 mg/KG and nivolumab 1 mg/KG every 3 weeks for the first 4 cycles followed by maintenance nivolumab 240 mg IV every 2 weeks.  First dose of this treatment October 26, 2018.  Status post 36 cycles.  Ipilimumab was discontinued after cycle #2 for the significant liver dysfunction.  Starting from cycle #3 the patient is on treatment with single agent nivolumab.  INTERVAL HISTORY: Leslie [name] 44 y.o. female returns to the clinic today for follow-up visit.  The patient is feeling fine today with no concerning complaints.  She denied having any chest pain, shortness of breath, cough or hemoptysis.  She denied having any fever or chills.  She has no nausea, vomiting, diarrhea or constipation.  She has no headache or visual changes.  She is here today for evaluation before starting cycle #37 of her treatment.  The patient is also interested in moving to the every 4 weeks as scheduled starting from the next cycle.   MEDICAL HISTORY: Past Medical History:  Diagnosis Date  . 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 Migraines   . Yeast infection     ALLERGIES:  is allergic to diflucan [fluconazole].  MEDICATIONS:  Current Outpatient Medications  Medication Sig Dispense Refill  . AMERICAN GINSENG PO Take 1,500-3,000 mg by mouth daily. Depending on level of fatigue    . Amino Acids (L-CARNITINE) LIQD Take 2,000 mg by mouth daily. Liquid L-Carnitine 2000 mg    . Ascorbic Acid (LIQUID C 500) 500 MG/15ML LIQD Take 1,000 mg by mouth daily.    . Beta Glucan POWD 1 Dose by Does not apply route daily. Beta D Glucan 400 mg    . Calcium-Magnesium (CAL-MAG PO) Take 15 mLs by mouth daily. Marine Based Cal - Mag (500 mg Calcium / 250 mg Mag) 15 ml total    . Coenzyme Q10 (COQ-10) 100 MG CAPS Take 1 tablet by mouth daily.    Marland Kitchen CREATINE MONOHYDRATE PO Take 6 g by mouth daily. Creatine MonoHydrate 6 g (on training cycle)    . doxylamine, Sleep, (UNISOM) 25 MG tablet Take 12.5 mg  by mouth at bedtime as needed for sleep.    . ferrous sulfate 324 MG TBEC Take 324 mg by mouth 2 (two) times daily.    . Glucosamine-Chondroit-Vit C-Mn (GLUCOSAMINE 1500 COMPLEX PO) Take 1 tablet by mouth daily. Vegan CMO Glucosamine    . Hyaluronic Acid-Vitamin C (HYALURONIC ACID PO) Take 100 mg by mouth daily. Liquid Hyaluronic Acid 100 mg    . Iron-Folic Acid-Vit G81 (IRON FORMULA PO) Take  1 Dose by mouth daily. Iron 50 mg (1/2 before workout and 1/2 afterwards - cycle 1 wk off and on)    . Lysine POWD 1,600 mg by Does not apply route daily. L-Lysine Powder 1600 mg    . Methylsulfonylmethane (MSM) 1000 MG CAPS Take 4 capsules by mouth daily. 4000 mg (1/2 before workout and 1/2 afterwards)    . Multiple Vitamin (MULTIVITAMIN) tablet Take 1 tablet by mouth daily.    . Multiple Vitamins-Minerals (MY-VITALIFE) CAPS Take by mouth.    . Multiple Vitamins-Minerals (ZINC PO) Take 1 tablet by mouth daily. Raw Zinc 30 mg (cycle every other week)    . nivolumab (OPDIVO) 40 MG/4ML SOLN chemo injection Opdivo 40 mg/4 mL intravenous solution  INFUSE 40 MG OVER 30 MINUTE(S) BY INTRAVENOUS ROUTE EVERY 2 WEEKS    . NON FORMULARY Take 1 Dose by mouth daily. Cordyceps 750 mg    . NON FORMULARY Take 1 Dose by mouth daily. Liver Herbal Supplement 675 mg    . NON FORMULARY Take 1 Dose by mouth daily. Reishi Mushroom 500 mg    . NON FORMULARY Take 1 Dose by mouth daily. Curcumin Phytosome (Bioperrine) 3000 mg    . NON FORMULARY Take 1 tablet by mouth daily. Artemisnin 200 mg    . NON FORMULARY Take 1 tablet by mouth daily. Wellness Herbal Tablet as needed (Elderberry, Ashwagandha, etc)    . NON FORMULARY Take 1 tablet by mouth daily. Zinc L Carnosine Complex 75/59 mg (after workout)    . Omega 3-6-9 Fatty Acids (OMEGA-3-6-9 PO) Take 2 capsules by mouth daily. Plant Based Omega 3-6-9 2 capsules = 1825 mg    . OVER THE COUNTER MEDICATION TK 5 MILLILITERS PO QID PRF MOUTH PAIN    . QUERCETIN PO Take 1 tablet by mouth daily. Quercetin 400 mg / Bromelain 85 mg    . RESVERATROL PO Take 1,200 mg by mouth daily. Trans Resveratrol    . SM Omega-3-6-9 Fatty Acids CAPS Take by mouth.    Marland Kitchen VITAMIN A PO Take 1 tablet by mouth daily. 1500 mcg (5000 IU)    . Vitamin D-Vitamin K (VITAMIN K2-VITAMIN D3 PO) Take 1 capsule by mouth daily. 5000 IU + 50 mcg    . vitamin E 180 MG (400 UNITS) capsule Take by mouth.    Marland Kitchen  VITAMIN E PO Take 1 tablet by mouth daily. Vegan Vitamin E     No current facility-administered medications for this visit.    SURGICAL HISTORY:  Past Surgical History:  Procedure Laterality Date  . APPLICATION OF CRANIAL NAVIGATION Left 04/29/2018   Procedure: APPLICATION OF CRANIAL NAVIGATION;  Surgeon: Earnie Larsson, MD;  Location: [place];  Service: Neurosurgery;  Laterality: Left;  . CRANIOTOMY Left 04/29/2018   Procedure: LEFT CRANIOTOMY FOR  TUMOR BRAIN LAB;  Surgeon: Earnie Larsson, MD;  Location: [place];  Service: Neurosurgery;  Laterality: Left;    REVIEW OF SYSTEMS:  A comprehensive review of systems was negative.   PHYSICAL EXAMINATION: General appearance: alert, cooperative and  no distress Head: Normocephalic, without obvious abnormality, atraumatic Neck: no adenopathy, no JVD, supple, symmetrical, trachea midline and thyroid not enlarged, symmetric, no tenderness/mass/nodules Lymph nodes: Cervical, supraclavicular, and axillary nodes normal. Resp: clear to auscultation bilaterally Back: negative, symmetric, no curvature. ROM normal. No CVA tenderness. Cardio: regular rate and rhythm, S1, S2 normal, no murmur, click, rub or gallop GI: soft, non-tender; bowel sounds normal; no masses,  no organomegaly Extremities: extremities normal, atraumatic, no cyanosis or edema  ECOG PERFORMANCE STATUS: 0 - Asymptomatic  Blood pressure 116/82, pulse (!) 55, temperature 97.6 F (36.4 C), temperature source Tympanic, resp. rate 18, height 5' 9"  (1.753 m), weight 147 lb (66.7 kg), SpO2 100 %, unknown if currently breastfeeding.  LABORATORY DATA: Lab Results  Component Value Date   WBC 3.6 (L) 09/10/2020   HGB 13.8 09/10/2020   HCT 40.2 09/10/2020   MCV 92.2 09/10/2020   PLT 214 09/10/2020      Chemistry      Component Value Date/Time   NA 140 09/10/2020 0825   K 4.3 09/10/2020 0825   CL 111 09/10/2020 0825   CO2 23 09/10/2020 0825   BUN 9 09/10/2020 0825   CREATININE 0.98  09/10/2020 0825      Component Value Date/Time   CALCIUM 9.2 09/10/2020 0825   ALKPHOS 71 09/10/2020 0825   AST 25 09/10/2020 0825   ALT 21 09/10/2020 0825   BILITOT 0.3 09/10/2020 0825       RADIOGRAPHIC STUDIES: No results found.  ASSESSMENT AND PLAN: This is a very pleasant 44 years old white female with highly suspicious metastatic malignant melanoma presented with large mass in the left upper/left lower lobe and mediastinal lymphadenopathy as well as solitary brain metastasis status post left temporal craniotomy and resection of tumor on 04/29/2018 and she is recovering well from her surgery. The patient completed stereotactic radiotherapy to the resection cavity next week under the care of Dr. Lisbeth Renshaw. The patient underwent treatment with immunotherapy with Keytruda 200 mg IV every 3 weeks status post 3 cycles.   Her scan after cycle #3 showed enlargement of the left upper lobe lung mass.  This was also suspicious for pseudo-progression on immunotherapy.  The patient was started on a palliative course of radiotherapy to the left upper lobe lung mass and she tolerated this treatment fairly well.  She was seen by Dr. Emelda Brothers at [facility] and she recommended for the patient to resume her treatment with [facility] for now until she undergoes further molecular studies.  The patient was treated with 2 more cycles of Keytruda and tolerated the treatment well. Her recent CT scan of the chest, abdomen and pelvis showed some improvement in the left upper lobe lung mass but there is still concern about pericardial invasion. I personally and independently reviewed the scan images and discussed the result and showed the images to the patient and her boyfriend today.  She is still not a good candidate for surgical resection because of the pericardial invasion. The final pathology report and recommendation from [facility] was consistent with metastatic melanoma and the  recommendation is to switch the patient to a combination immunotherapy with Ipilumumab and nivolumab. The patient underwent treatment with immunotherapy with ipilimumab and nivolumab status post 2 cycles.  Her treatment was on hold for more than 6 weeks secondary to grade 3 hepatic dysfunction.  She had improvement of her liver enzyme after a prolonged treatment with a steroid with a tapering schedule. She resumed her treatment again with  single agent nivolumab.  She is status post 36 cycles of treatment. She continues to tolerate her treatment well with no concerning adverse effects. We will proceed with cycle #37 today as planned.  The patient is interested in moving to the every 4 weeks as scheduled starting from the next cycle. I will change her care plan to nivolumab 480 mg IV every 4 weeks starting in 2 weeks. She was advised to call immediately if she has any concerning symptoms in the interval. The patient voices understanding of current disease status and treatment options and is in agreement with the current care plan. All questions were answered. The patient knows to call the clinic with any problems, questions or concerns. We can certainly see the patient much sooner if necessary.  Disclaimer: This note was dictated with voice recognition software. Similar sounding words can inadvertently be transcribed and may not be corrected upon review.

## 2020-09-24 DIAGNOSIS — D2230 Melanocytic nevi of unspecified part of face: Secondary | ICD-10-CM | POA: Diagnosis not present

## 2020-09-24 DIAGNOSIS — D2330 Other benign neoplasm of skin of unspecified part of face: Secondary | ICD-10-CM | POA: Diagnosis not present

## 2020-09-24 DIAGNOSIS — L821 Other seborrheic keratosis: Secondary | ICD-10-CM | POA: Diagnosis not present

## 2020-09-25 ENCOUNTER — Telehealth: Payer: Self-pay

## 2020-09-25 ENCOUNTER — Other Ambulatory Visit: Payer: Self-pay | Admitting: Physician Assistant

## 2020-09-25 DIAGNOSIS — M2560 Stiffness of unspecified joint, not elsewhere classified: Secondary | ICD-10-CM | POA: Diagnosis not present

## 2020-09-25 DIAGNOSIS — M9903 Segmental and somatic dysfunction of lumbar region: Secondary | ICD-10-CM | POA: Diagnosis not present

## 2020-09-25 DIAGNOSIS — C7802 Secondary malignant neoplasm of left lung: Secondary | ICD-10-CM

## 2020-09-25 DIAGNOSIS — M62838 Other muscle spasm: Secondary | ICD-10-CM | POA: Diagnosis not present

## 2020-09-25 NOTE — Telephone Encounter (Signed)
She gets her PET scan done at [facility].  We will be happy to order it here if she would like it to be done at the [facility] system.  Thank you.

## 2020-09-25 NOTE — Telephone Encounter (Signed)
Pt states she is due for a PET scan and requests this be ordered. She currently has orders for CT scans and brain MRI pending scheduling. Please advise.

## 2020-09-26 DIAGNOSIS — F419 Anxiety disorder, unspecified: Secondary | ICD-10-CM | POA: Diagnosis not present

## 2020-09-30 ENCOUNTER — Inpatient Hospital Stay: Payer: BC Managed Care – PPO

## 2020-10-02 DIAGNOSIS — M9903 Segmental and somatic dysfunction of lumbar region: Secondary | ICD-10-CM | POA: Diagnosis not present

## 2020-10-02 DIAGNOSIS — M2560 Stiffness of unspecified joint, not elsewhere classified: Secondary | ICD-10-CM | POA: Diagnosis not present

## 2020-10-02 DIAGNOSIS — M62838 Other muscle spasm: Secondary | ICD-10-CM | POA: Diagnosis not present

## 2020-10-07 DIAGNOSIS — M62838 Other muscle spasm: Secondary | ICD-10-CM | POA: Diagnosis not present

## 2020-10-07 DIAGNOSIS — M9903 Segmental and somatic dysfunction of lumbar region: Secondary | ICD-10-CM | POA: Diagnosis not present

## 2020-10-07 DIAGNOSIS — M2560 Stiffness of unspecified joint, not elsewhere classified: Secondary | ICD-10-CM | POA: Diagnosis not present

## 2020-10-08 ENCOUNTER — Inpatient Hospital Stay (HOSPITAL_BASED_OUTPATIENT_CLINIC_OR_DEPARTMENT_OTHER): Payer: BC Managed Care – PPO | Admitting: Internal Medicine

## 2020-10-08 ENCOUNTER — Other Ambulatory Visit: Payer: Self-pay

## 2020-10-08 ENCOUNTER — Inpatient Hospital Stay: Payer: BC Managed Care – PPO

## 2020-10-08 ENCOUNTER — Encounter: Payer: Self-pay | Admitting: Internal Medicine

## 2020-10-08 VITALS — BP 123/76 | HR 77 | Temp 98.5°F | Resp 18 | Ht 69.0 in | Wt 145.4 lb

## 2020-10-08 DIAGNOSIS — C3432 Malignant neoplasm of lower lobe, left bronchus or lung: Secondary | ICD-10-CM

## 2020-10-08 DIAGNOSIS — Z923 Personal history of irradiation: Secondary | ICD-10-CM | POA: Diagnosis not present

## 2020-10-08 DIAGNOSIS — C7802 Secondary malignant neoplasm of left lung: Secondary | ICD-10-CM

## 2020-10-08 DIAGNOSIS — Z5112 Encounter for antineoplastic immunotherapy: Secondary | ICD-10-CM | POA: Diagnosis not present

## 2020-10-08 DIAGNOSIS — C439 Malignant melanoma of skin, unspecified: Secondary | ICD-10-CM | POA: Diagnosis not present

## 2020-10-08 DIAGNOSIS — C7931 Secondary malignant neoplasm of brain: Secondary | ICD-10-CM | POA: Diagnosis not present

## 2020-10-08 LAB — CMP (CANCER CENTER ONLY)
ALT: 25 U/L (ref 0–44)
AST: 35 U/L (ref 15–41)
Albumin: 4.4 g/dL (ref 3.5–5.0)
Alkaline Phosphatase: 79 U/L (ref 38–126)
Anion gap: 8 (ref 5–15)
BUN: 17 mg/dL (ref 6–20)
CO2: 26 mmol/L (ref 22–32)
Calcium: 9.5 mg/dL (ref 8.9–10.3)
Chloride: 107 mmol/L (ref 98–111)
Creatinine: 1.02 mg/dL — ABNORMAL HIGH (ref 0.44–1.00)
GFR, Estimated: >60 mL/min (ref >60)
Glucose, Bld: 94 mg/dL (ref 70–99)
Potassium: 4 mmol/L (ref 3.5–5.1)
Sodium: 141 mmol/L (ref 135–145)
Total Bilirubin: 0.4 mg/dL (ref 0.3–1.2)
Total Protein: 7 g/dL (ref 6.5–8.1)

## 2020-10-08 LAB — CBC WITH DIFFERENTIAL (CANCER CENTER ONLY)
Abs Immature Granulocytes: 0 10*3/uL (ref 0.00–0.07)
Basophils Absolute: 0 10*3/uL (ref 0.0–0.1)
Basophils Relative: 1 %
Eosinophils Absolute: 0.1 10*3/uL (ref 0.0–0.5)
Eosinophils Relative: 2 %
HCT: 41.5 % (ref 36.0–46.0)
Hemoglobin: 13.9 g/dL (ref 12.0–15.0)
Immature Granulocytes: 0 %
Lymphocytes Relative: 25 %
Lymphs Abs: 1.2 10*3/uL (ref 0.7–4.0)
MCH: 30.8 pg (ref 26.0–34.0)
MCHC: 33.5 g/dL (ref 30.0–36.0)
MCV: 92 fL (ref 80.0–100.0)
Monocytes Absolute: 0.5 10*3/uL (ref 0.1–1.0)
Monocytes Relative: 11 %
Neutro Abs: 3 10*3/uL (ref 1.7–7.7)
Neutrophils Relative %: 61 %
Platelet Count: 218 10*3/uL (ref 150–400)
RBC: 4.51 MIL/uL (ref 3.87–5.11)
RDW: 12.6 % (ref 11.5–15.5)
WBC Count: 4.9 10*3/uL (ref 4.0–10.5)
nRBC: 0 % (ref 0.0–0.2)

## 2020-10-08 LAB — TSH: TSH: 2.421 u[IU]/mL (ref 0.308–3.960)

## 2020-10-08 MED ORDER — SODIUM CHLORIDE 0.9 % IV SOLN
Freq: Once | INTRAVENOUS | Status: AC
  Filled 2020-10-08: qty 250

## 2020-10-08 MED ORDER — SODIUM CHLORIDE 0.9 % IV SOLN
480.0000 mg | Freq: Once | INTRAVENOUS | Status: AC
  Administered 2020-10-08: 480 mg via INTRAVENOUS
  Filled 2020-10-08: qty 48

## 2020-10-08 NOTE — Progress Notes (Signed)
[place] Telephone:(336) [phone]   [phone]  OFFICE PROGRESS NOTE  Pcp, No No address on file  DIAGNOSIS: Metastatic high-grade neoplasm consistent with metastatic malignant melanoma based on the most recent pathology report from [facility] in October 2019, presented with large left left upper/left lower lobe mass with a hypermetabolic AP window lymph node as well as solitary metastatic brain lesion diagnosed in May 2019.  Biomarker Findings Microsatellite status - MS-Stable Tumor Mutational Burden - TMB-Intermediate (16 Muts/Mb) Genomic Findings For a complete list of the genes assayed, please refer to the Appendix. CD274 (PD-L1) amplification NRAS Q61R PDCD1LG2 (PD-L2) amplification MYC amplification EPHB1 amplification JAK2 amplification RB1 Q93*, D326* TERT promoter -146C>T TP53 C275W   PRIOR THERAPY: 1) left temporal craniotomy with resection of tumor with intraoperative stereotactic guidance for volumetric resection under the care of Dr. Annette Stable on 04/29/2018. 2) First-line treatment with immunotherapy with Keytruda 200 mg IV every 3 weeks.  First dose June 02, 2018.  Status post 3 cycles.  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 3) palliative radiotherapy to the left lung mass under the care of Dr. Lisbeth Renshaw completed on 08/24/2018. 4) Resuming her treatment again with Keytruda 200 mg IV every 3 weeks, first dose 09/08/2018.  Status post 2 cycles.  CURRENT THERAPY: Treatment with immunotherapy with ipilimumab 3 mg/KG and nivolumab 1 mg/KG every 3 weeks for the first 4 cycles followed by maintenance nivolumab 240 mg IV every 2 weeks.  First dose of this treatment October 26, 2018.  Status post 37 cycles.  Ipilimumab was discontinued after cycle #2 for the significant liver dysfunction.  Starting from cycle #3 the patient is on treatment with single agent nivolumab. Starting from cycle #38 the  patient is switching to nivolumab 480 mg IV every 4 weeks.  INTERVAL HISTORY: Leslie [name] 44 y.o. female returns to the clinic today for follow-up visit.  The patient is feeling fine today with no concerning complaints except for occasional heartburn.  She denied having any chest pain, shortness of breath, cough or hemoptysis.  She denied having any fever or chills.  She has no nausea, vomiting, diarrhea or constipation.  She has no headache or visual changes.  She denied having any weight loss or night sweats.  She is here today for evaluation before starting the first dose of nivolumab 480 mg IV every 4 weeks.  MEDICAL HISTORY: Past Medical History:  Diagnosis Date  . 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 Migraines   . Yeast infection     ALLERGIES:  is allergic to diflucan [fluconazole].  MEDICATIONS:  Current Outpatient Medications  Medication Sig Dispense Refill  . AMERICAN GINSENG PO Take 1,500-3,000 mg by mouth daily. Depending on level of fatigue    . Amino Acids (L-CARNITINE) LIQD Take 2,000 mg by mouth daily. Liquid L-Carnitine 2000 mg    . Ascorbic Acid (LIQUID C 500) 500 MG/15ML LIQD Take 1,000 mg by mouth daily.    . Beta Glucan POWD 1 Dose by Does not apply route daily. Beta D Glucan 400 mg    . Calcium-Magnesium (CAL-MAG PO) Take 15 mLs by mouth daily. Marine Based Cal - Mag (500 mg Calcium / 250 mg Mag) 15 ml total    . Coenzyme Q10 (COQ-10) 100 MG CAPS Take 1 tablet by mouth daily.    Marland Kitchen CREATINE MONOHYDRATE PO Take 6 g by mouth daily. Creatine MonoHydrate 6 g (on  training cycle)    . doxylamine, Sleep, (UNISOM) 25 MG tablet Take 12.5 mg by mouth at bedtime as needed for sleep.    . ferrous sulfate 324 MG TBEC Take 324 mg by mouth 2 (two) times daily.    . Glucosamine-Chondroit-Vit C-Mn (GLUCOSAMINE 1500 COMPLEX PO) Take 1 tablet by mouth daily. Vegan CMO Glucosamine    . Hyaluronic Acid-Vitamin C (HYALURONIC ACID PO) Take 100 mg by mouth  daily. Liquid Hyaluronic Acid 100 mg    . Iron-Folic Acid-Vit N19 (IRON FORMULA PO) Take 1 Dose by mouth daily. Iron 50 mg (1/2 before workout and 1/2 afterwards - cycle 1 wk off and on)    . Lysine POWD 1,600 mg by Does not apply route daily. L-Lysine Powder 1600 mg    . Methylsulfonylmethane (MSM) 1000 MG CAPS Take 4 capsules by mouth daily. 4000 mg (1/2 before workout and 1/2 afterwards)    . Multiple Vitamin (MULTIVITAMIN) tablet Take 1 tablet by mouth daily.    . Multiple Vitamins-Minerals (MY-VITALIFE) CAPS Take by mouth.    . Multiple Vitamins-Minerals (ZINC PO) Take 1 tablet by mouth daily. Raw Zinc 30 mg (cycle every other week)    . nivolumab (OPDIVO) 40 MG/4ML SOLN chemo injection Opdivo 40 mg/4 mL intravenous solution  INFUSE 40 MG OVER 30 MINUTE(S) BY INTRAVENOUS ROUTE EVERY 2 WEEKS    . NON FORMULARY Take 1 Dose by mouth daily. Cordyceps 750 mg    . NON FORMULARY Take 1 Dose by mouth daily. Liver Herbal Supplement 675 mg    . NON FORMULARY Take 1 Dose by mouth daily. Reishi Mushroom 500 mg    . NON FORMULARY Take 1 Dose by mouth daily. Curcumin Phytosome (Bioperrine) 3000 mg    . NON FORMULARY Take 1 tablet by mouth daily. Artemisnin 200 mg    . NON FORMULARY Take 1 tablet by mouth daily. Wellness Herbal Tablet as needed (Elderberry, Ashwagandha, etc)    . NON FORMULARY Take 1 tablet by mouth daily. Zinc L Carnosine Complex 75/59 mg (after workout)    . Omega 3-6-9 Fatty Acids (OMEGA-3-6-9 PO) Take 2 capsules by mouth daily. Plant Based Omega 3-6-9 2 capsules = 1825 mg    . OVER THE COUNTER MEDICATION TK 5 MILLILITERS PO QID PRF MOUTH PAIN    . QUERCETIN PO Take 1 tablet by mouth daily. Quercetin 400 mg / Bromelain 85 mg    . RESVERATROL PO Take 1,200 mg by mouth daily. Trans Resveratrol    . SM Omega-3-6-9 Fatty Acids CAPS Take by mouth.    Marland Kitchen VITAMIN A PO Take 1 tablet by mouth daily. 1500 mcg (5000 IU)    . Vitamin D-Vitamin K (VITAMIN K2-VITAMIN D3 PO) Take 1 capsule by mouth  daily. 5000 IU + 50 mcg    . vitamin E 180 MG (400 UNITS) capsule Take by mouth.    Marland Kitchen VITAMIN E PO Take 1 tablet by mouth daily. Vegan Vitamin E     No current facility-administered medications for this visit.    SURGICAL HISTORY:  Past Surgical History:  Procedure Laterality Date  . APPLICATION OF CRANIAL NAVIGATION Left 04/29/2018   Procedure: APPLICATION OF CRANIAL NAVIGATION;  Surgeon: Earnie Larsson, MD;  Location: [place];  Service: Neurosurgery;  Laterality: Left;  . CRANIOTOMY Left 04/29/2018   Procedure: LEFT CRANIOTOMY FOR  TUMOR BRAIN LAB;  Surgeon: Earnie Larsson, MD;  Location: [place];  Service: Neurosurgery;  Laterality: Left;    REVIEW OF SYSTEMS:  A  comprehensive review of systems was negative.   PHYSICAL EXAMINATION: General appearance: alert, cooperative and no distress Head: Normocephalic, without obvious abnormality, atraumatic Neck: no adenopathy, no JVD, supple, symmetrical, trachea midline and thyroid not enlarged, symmetric, no tenderness/mass/nodules Lymph nodes: Cervical, supraclavicular, and axillary nodes normal. Resp: clear to auscultation bilaterally Back: negative, symmetric, no curvature. ROM normal. No CVA tenderness. Cardio: regular rate and rhythm, S1, S2 normal, no murmur, click, rub or gallop GI: soft, non-tender; bowel sounds normal; no masses,  no organomegaly Extremities: extremities normal, atraumatic, no cyanosis or edema  ECOG PERFORMANCE STATUS: 0 - Asymptomatic  Blood pressure 123/76, pulse 77, temperature 98.5 F (36.9 C), temperature source Tympanic, resp. rate 18, height 5' 9"  (1.753 m), weight 145 lb 6.4 oz (66 kg), SpO2 100 %, unknown if currently breastfeeding.  LABORATORY DATA: Lab Results  Component Value Date   WBC 4.9 10/08/2020   HGB 13.9 10/08/2020   HCT 41.5 10/08/2020   MCV 92.0 10/08/2020   PLT 218 10/08/2020      Chemistry      Component Value Date/Time   NA 140 09/23/2020 0959   K 4.2 09/23/2020 0959   CL 106  09/23/2020 0959   CO2 29 09/23/2020 0959   BUN 16 09/23/2020 0959   CREATININE 1.03 (H) 09/23/2020 0959      Component Value Date/Time   CALCIUM 9.4 09/23/2020 0959   ALKPHOS 75 09/23/2020 0959   AST 36 09/23/2020 0959   ALT 26 09/23/2020 0959   BILITOT <0.2 (L) 09/23/2020 0959       RADIOGRAPHIC STUDIES: No results found.  ASSESSMENT AND PLAN: This is a very pleasant 44 years old white female with highly suspicious metastatic malignant melanoma presented with large mass in the left upper/left lower lobe and mediastinal lymphadenopathy as well as solitary brain metastasis status post left temporal craniotomy and resection of tumor on 04/29/2018 and she is recovering well from her surgery. The patient completed stereotactic radiotherapy to the resection cavity next week under the care of Dr. Lisbeth Renshaw. The patient underwent treatment with immunotherapy with Keytruda 200 mg IV every 3 weeks status post 3 cycles.   Her scan after cycle #3 showed enlargement of the left upper lobe lung mass.  This was also suspicious for pseudo-progression on immunotherapy.  The patient was started on a palliative course of radiotherapy to the left upper lobe lung mass and she tolerated this treatment fairly well.  She was seen by Dr. Emelda Brothers at [facility] and she recommended for the patient to resume her treatment with [facility] for now until she undergoes further molecular studies.  The patient was treated with 2 more cycles of Keytruda and tolerated the treatment well. Her recent CT scan of the chest, abdomen and pelvis showed some improvement in the left upper lobe lung mass but there is still concern about pericardial invasion. I personally and independently reviewed the scan images and discussed the result and showed the images to the patient and her boyfriend today.  She is still not a good candidate for surgical resection because of the pericardial invasion. The final pathology report and  recommendation from [facility] was consistent with metastatic melanoma and the recommendation is to switch the patient to a combination immunotherapy with Ipilumumab and nivolumab. The patient underwent treatment with immunotherapy with ipilimumab and nivolumab status post 2 cycles.  Her treatment was on hold for more than 6 weeks secondary to grade 3 hepatic dysfunction.  She had improvement of her liver enzyme  after a prolonged treatment with a steroid with a tapering schedule. She resumed her treatment again with single agent nivolumab.  She is status post 37 cycles of treatment. She continues to tolerate her treatment well with no concerning adverse effects. Starting from cycle #38 the patient will be treated with nivolumab 480 mg IV every 4 weeks. I recommended for the patient to proceed with the treatment today as planned.  She was advised to call immediately if she has any concerning symptoms in the interval. She will come back for follow-up visit in 4 weeks before the next cycle of her treatment. The patient voices understanding of current disease status and treatment options and is in agreement with the current care plan. All questions were answered. The patient knows to call the clinic with any problems, questions or concerns. We can certainly see the patient much sooner if necessary.  Disclaimer: This note was dictated with voice recognition software. Similar sounding words can inadvertently be transcribed and may not be corrected upon review.

## 2020-10-08 NOTE — Patient Instructions (Signed)
[facility] Discharge Instructions for Patients Receiving Chemotherapy  Today you received the following chemotherapy agents Opdivo  To help prevent nausea and vomiting after your treatment, we encourage you to take your nausea medication as directed.    If you develop nausea and vomiting that is not controlled by your nausea medication, call the clinic.   BELOW ARE SYMPTOMS THAT SHOULD BE REPORTED IMMEDIATELY:  *FEVER GREATER THAN 100.5 F  *CHILLS WITH OR WITHOUT FEVER  NAUSEA AND VOMITING THAT IS NOT CONTROLLED WITH YOUR NAUSEA MEDICATION  *UNUSUAL SHORTNESS OF BREATH  *UNUSUAL BRUISING OR BLEEDING  TENDERNESS IN MOUTH AND THROAT WITH OR WITHOUT PRESENCE OF ULCERS  *URINARY PROBLEMS  *BOWEL PROBLEMS  UNUSUAL RASH Items with * indicate a potential emergency and should be followed up as soon as possible.  Feel free to call the clinic should you have any questions or concerns. The clinic phone number is [phone].  Please show the [place] at check-in to the Emergency Department and triage nurse.

## 2020-10-15 DIAGNOSIS — C7931 Secondary malignant neoplasm of brain: Secondary | ICD-10-CM | POA: Diagnosis not present

## 2020-10-22 ENCOUNTER — Other Ambulatory Visit: Payer: BC Managed Care – PPO

## 2020-10-22 ENCOUNTER — Ambulatory Visit: Payer: BC Managed Care – PPO

## 2020-10-22 ENCOUNTER — Ambulatory Visit: Payer: BC Managed Care – PPO | Admitting: Internal Medicine

## 2020-10-24 DIAGNOSIS — F419 Anxiety disorder, unspecified: Secondary | ICD-10-CM | POA: Diagnosis not present

## 2020-10-30 DIAGNOSIS — M9906 Segmental and somatic dysfunction of lower extremity: Secondary | ICD-10-CM | POA: Diagnosis not present

## 2020-10-30 DIAGNOSIS — M654 Radial styloid tenosynovitis [de Quervain]: Secondary | ICD-10-CM | POA: Diagnosis not present

## 2020-10-30 DIAGNOSIS — M9902 Segmental and somatic dysfunction of thoracic region: Secondary | ICD-10-CM | POA: Diagnosis not present

## 2020-10-30 DIAGNOSIS — M2560 Stiffness of unspecified joint, not elsewhere classified: Secondary | ICD-10-CM | POA: Diagnosis not present

## 2020-11-04 ENCOUNTER — Encounter: Payer: Self-pay | Admitting: Internal Medicine

## 2020-11-04 ENCOUNTER — Inpatient Hospital Stay: Payer: BC Managed Care – PPO | Attending: Physician Assistant

## 2020-11-04 ENCOUNTER — Inpatient Hospital Stay (HOSPITAL_BASED_OUTPATIENT_CLINIC_OR_DEPARTMENT_OTHER): Payer: BC Managed Care – PPO | Admitting: Internal Medicine

## 2020-11-04 ENCOUNTER — Inpatient Hospital Stay: Payer: BC Managed Care – PPO

## 2020-11-04 ENCOUNTER — Other Ambulatory Visit: Payer: Self-pay

## 2020-11-04 VITALS — BP 110/75 | HR 62 | Temp 96.5°F | Resp 18 | Ht 69.0 in | Wt 147.5 lb

## 2020-11-04 DIAGNOSIS — C7802 Secondary malignant neoplasm of left lung: Secondary | ICD-10-CM | POA: Insufficient documentation

## 2020-11-04 DIAGNOSIS — C7931 Secondary malignant neoplasm of brain: Secondary | ICD-10-CM

## 2020-11-04 DIAGNOSIS — Z923 Personal history of irradiation: Secondary | ICD-10-CM | POA: Diagnosis not present

## 2020-11-04 DIAGNOSIS — Z5112 Encounter for antineoplastic immunotherapy: Secondary | ICD-10-CM

## 2020-11-04 DIAGNOSIS — C439 Malignant melanoma of skin, unspecified: Secondary | ICD-10-CM | POA: Diagnosis not present

## 2020-11-04 LAB — CBC WITH DIFFERENTIAL (CANCER CENTER ONLY)
Abs Immature Granulocytes: 0.01 10*3/uL (ref 0.00–0.07)
Basophils Absolute: 0.1 10*3/uL (ref 0.0–0.1)
Basophils Relative: 1 %
Eosinophils Absolute: 0.1 10*3/uL (ref 0.0–0.5)
Eosinophils Relative: 1 %
HCT: 42.4 % (ref 36.0–46.0)
Hemoglobin: 14.6 g/dL (ref 12.0–15.0)
Immature Granulocytes: 0 %
Lymphocytes Relative: 18 %
Lymphs Abs: 1.1 10*3/uL (ref 0.7–4.0)
MCH: 31.7 pg (ref 26.0–34.0)
MCHC: 34.4 g/dL (ref 30.0–36.0)
MCV: 92 fL (ref 80.0–100.0)
Monocytes Absolute: 0.6 10*3/uL (ref 0.1–1.0)
Monocytes Relative: 9 %
Neutro Abs: 4.4 10*3/uL (ref 1.7–7.7)
Neutrophils Relative %: 71 %
Platelet Count: 229 10*3/uL (ref 150–400)
RBC: 4.61 MIL/uL (ref 3.87–5.11)
RDW: 12.8 % (ref 11.5–15.5)
WBC Count: 6.2 10*3/uL (ref 4.0–10.5)
nRBC: 0 % (ref 0.0–0.2)

## 2020-11-04 LAB — CMP (CANCER CENTER ONLY)
ALT: 22 U/L (ref 0–44)
AST: 31 U/L (ref 15–41)
Albumin: 4.3 g/dL (ref 3.5–5.0)
Alkaline Phosphatase: 76 U/L (ref 38–126)
Anion gap: 7 (ref 5–15)
BUN: 18 mg/dL (ref 6–20)
CO2: 23 mmol/L (ref 22–32)
Calcium: 9.2 mg/dL (ref 8.9–10.3)
Chloride: 108 mmol/L (ref 98–111)
Creatinine: 0.97 mg/dL (ref 0.44–1.00)
GFR, Estimated: >60 mL/min (ref >60)
Glucose, Bld: 76 mg/dL (ref 70–99)
Potassium: 4.3 mmol/L (ref 3.5–5.1)
Sodium: 138 mmol/L (ref 135–145)
Total Bilirubin: 0.3 mg/dL (ref 0.3–1.2)
Total Protein: 7.1 g/dL (ref 6.5–8.1)

## 2020-11-04 LAB — TSH: TSH: 2.369 u[IU]/mL (ref 0.308–3.960)

## 2020-11-04 MED ORDER — SODIUM CHLORIDE 0.9 % IV SOLN
Freq: Once | INTRAVENOUS | Status: AC
  Filled 2020-11-04: qty 250

## 2020-11-04 MED ORDER — SODIUM CHLORIDE 0.9 % IV SOLN
480.0000 mg | Freq: Once | INTRAVENOUS | Status: AC
  Administered 2020-11-04: 480 mg via INTRAVENOUS
  Filled 2020-11-04: qty 48

## 2020-11-04 NOTE — Patient Instructions (Signed)
[facility] Discharge Instructions for Patients Receiving Chemotherapy  Today you received the following chemotherapy agents Opdivo  To help prevent nausea and vomiting after your treatment, we encourage you to take your nausea medication as directed.    If you develop nausea and vomiting that is not controlled by your nausea medication, call the clinic.   BELOW ARE SYMPTOMS THAT SHOULD BE REPORTED IMMEDIATELY:  *FEVER GREATER THAN 100.5 F  *CHILLS WITH OR WITHOUT FEVER  NAUSEA AND VOMITING THAT IS NOT CONTROLLED WITH YOUR NAUSEA MEDICATION  *UNUSUAL SHORTNESS OF BREATH  *UNUSUAL BRUISING OR BLEEDING  TENDERNESS IN MOUTH AND THROAT WITH OR WITHOUT PRESENCE OF ULCERS  *URINARY PROBLEMS  *BOWEL PROBLEMS  UNUSUAL RASH Items with * indicate a potential emergency and should be followed up as soon as possible.  Feel free to call the clinic should you have any questions or concerns. The clinic phone number is [phone].  Please show the [facility] at check-in to the Emergency Department and triage nurse.

## 2020-11-04 NOTE — Progress Notes (Signed)
[facility] [phone]   [phone]  OFFICE PROGRESS NOTE  Pcp, No No address on file  DIAGNOSIS: Metastatic high-grade neoplasm consistent with metastatic malignant melanoma based on the most recent pathology report from [facility] in October 2019, presented with large left left upper/left lower lobe mass with a hypermetabolic AP window lymph node as well as solitary metastatic brain lesion diagnosed in May 2019.  Biomarker Findings Microsatellite status - MS-Stable Tumor Mutational Burden - TMB-Intermediate (16 Muts/Mb) Genomic Findings For a complete list of the genes assayed, please refer to the Appendix. CD274 (PD-L1) amplification NRAS Q61R PDCD1LG2 (PD-L2) amplification MYC amplification EPHB1 amplification JAK2 amplification RB1 Q93*, E071* TERT promoter -146C>T TP53 C275W   PRIOR THERAPY: 1) left temporal craniotomy with resection of tumor with intraoperative stereotactic guidance for volumetric resection under the care of Dr. Annette Stable on 04/29/2018. 2) First-line treatment with immunotherapy with Keytruda 200 mg IV every 3 weeks.  First dose June 02, 2018.  Status post 3 cycles.  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 3) palliative radiotherapy to the left lung mass under the care of Dr. Lisbeth Renshaw completed on 08/24/2018. 4) Resuming her treatment again with Keytruda 200 mg IV every 3 weeks, first dose 09/08/2018.  Status post 2 cycles.  CURRENT THERAPY: Treatment with immunotherapy with ipilimumab 3 mg/KG and nivolumab 1 mg/KG every 3 weeks for the first 4 cycles followed by maintenance nivolumab 240 mg IV every 2 weeks.  First dose of this treatment October 26, 2018.  Status post 37 cycles.  Ipilimumab was discontinued after cycle #2 for the significant liver dysfunction.  Starting from cycle #3 the patient is on treatment with single agent nivolumab. Starting from cycle #38 the  patient is switching to nivolumab 480 mg IV every 4 weeks.  INTERVAL HISTORY: Leslie [name] 44 y.o. female returns to the clinic today for follow-up visit.  The patient is feeling fine today with no concerning complaints.  She had more fatigue and aching pain as well as arthralgia in the knees and fingers after the last cycle of her treatment that lasted for few days.  She denied having any current chest pain, shortness of breath, cough or hemoptysis.  She denied having any fever or chills.  She has no nausea, vomiting, diarrhea or constipation.  She has no headache or visual changes.  She is here today for evaluation before starting cycle #39.   MEDICAL HISTORY: Past Medical History:  Diagnosis Date  . 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 Migraines   . Yeast infection     ALLERGIES:  is allergic to diflucan [fluconazole].  MEDICATIONS:  Current Outpatient Medications  Medication Sig Dispense Refill  . AMERICAN GINSENG PO Take 1,500-3,000 mg by mouth daily. Depending on level of fatigue    . Amino Acids (L-CARNITINE) LIQD Take 2,000 mg by mouth daily. Liquid L-Carnitine 2000 mg    . Ascorbic Acid (LIQUID C 500) 500 MG/15ML LIQD Take 1,000 mg by mouth daily.    . Beta Glucan POWD 1 Dose by Does not apply route daily. Beta D Glucan 400 mg    . Calcium-Magnesium (CAL-MAG PO) Take 15 mLs by mouth daily. Marine Based Cal - Mag (500 mg Calcium / 250 mg Mag) 15 ml total    . Coenzyme Q10 (COQ-10) 100 MG CAPS Take 1 tablet by mouth daily.    Marland Kitchen CREATINE MONOHYDRATE PO Take 6 g  by mouth daily. Creatine MonoHydrate 6 g (on training cycle)    . doxylamine, Sleep, (UNISOM) 25 MG tablet Take 12.5 mg by mouth at bedtime as needed for sleep.    . ferrous sulfate 324 MG TBEC Take 324 mg by mouth 2 (two) times daily.    . Glucosamine-Chondroit-Vit C-Mn (GLUCOSAMINE 1500 COMPLEX PO) Take 1 tablet by mouth daily. Vegan CMO Glucosamine    . Hyaluronic Acid-Vitamin C  (HYALURONIC ACID PO) Take 100 mg by mouth daily. Liquid Hyaluronic Acid 100 mg    . Iron-Folic Acid-Vit N00 (IRON FORMULA PO) Take 1 Dose by mouth daily. Iron 50 mg (1/2 before workout and 1/2 afterwards - cycle 1 wk off and on)    . Lysine POWD 1,600 mg by Does not apply route daily. L-Lysine Powder 1600 mg    . Methylsulfonylmethane (MSM) 1000 MG CAPS Take 4 capsules by mouth daily. 4000 mg (1/2 before workout and 1/2 afterwards)    . Multiple Vitamin (MULTIVITAMIN) tablet Take 1 tablet by mouth daily.    . Multiple Vitamins-Minerals (MY-VITALIFE) CAPS Take by mouth.    . Multiple Vitamins-Minerals (ZINC PO) Take 1 tablet by mouth daily. Raw Zinc 30 mg (cycle every other week)    . nivolumab (OPDIVO) 40 MG/4ML SOLN chemo injection Opdivo 40 mg/4 mL intravenous solution  INFUSE 40 MG OVER 30 MINUTE(S) BY INTRAVENOUS ROUTE EVERY 2 WEEKS    . NON FORMULARY Take 1 Dose by mouth daily. Cordyceps 750 mg    . NON FORMULARY Take 1 Dose by mouth daily. Liver Herbal Supplement 675 mg    . NON FORMULARY Take 1 Dose by mouth daily. Reishi Mushroom 500 mg    . NON FORMULARY Take 1 Dose by mouth daily. Curcumin Phytosome (Bioperrine) 3000 mg    . NON FORMULARY Take 1 tablet by mouth daily. Artemisnin 200 mg    . NON FORMULARY Take 1 tablet by mouth daily. Wellness Herbal Tablet as needed (Elderberry, Ashwagandha, etc)    . NON FORMULARY Take 1 tablet by mouth daily. Zinc L Carnosine Complex 75/59 mg (after workout)    . Omega 3-6-9 Fatty Acids (OMEGA-3-6-9 PO) Take 2 capsules by mouth daily. Plant Based Omega 3-6-9 2 capsules = 1825 mg    . OVER THE COUNTER MEDICATION TK 5 MILLILITERS PO QID PRF MOUTH PAIN    . QUERCETIN PO Take 1 tablet by mouth daily. Quercetin 400 mg / Bromelain 85 mg    . RESVERATROL PO Take 1,200 mg by mouth daily. Trans Resveratrol    . SM Omega-3-6-9 Fatty Acids CAPS Take by mouth.    Marland Kitchen VITAMIN A PO Take 1 tablet by mouth daily. 1500 mcg (5000 IU)    . Vitamin D-Vitamin K (VITAMIN  K2-VITAMIN D3 PO) Take 1 capsule by mouth daily. 5000 IU + 50 mcg    . vitamin E 180 MG (400 UNITS) capsule Take by mouth.    Marland Kitchen VITAMIN E PO Take 1 tablet by mouth daily. Vegan Vitamin E     No current facility-administered medications for this visit.    SURGICAL HISTORY:  Past Surgical History:  Procedure Laterality Date  . APPLICATION OF CRANIAL NAVIGATION Left 04/29/2018   Procedure: APPLICATION OF CRANIAL NAVIGATION;  Surgeon: Earnie Larsson, MD;  Location: [facility];  Service: Neurosurgery;  Laterality: Left;  . CRANIOTOMY Left 04/29/2018   Procedure: LEFT CRANIOTOMY FOR  TUMOR BRAIN LAB;  Surgeon: Earnie Larsson, MD;  Location: [facility];  Service: Neurosurgery;  Laterality: Left;  REVIEW OF SYSTEMS:  A comprehensive review of systems was negative.   PHYSICAL EXAMINATION: General appearance: alert, cooperative and no distress Head: Normocephalic, without obvious abnormality, atraumatic Neck: no adenopathy, no JVD, supple, symmetrical, trachea midline and thyroid not enlarged, symmetric, no tenderness/mass/nodules Lymph nodes: Cervical, supraclavicular, and axillary nodes normal. Resp: clear to auscultation bilaterally Back: negative, symmetric, no curvature. ROM normal. No CVA tenderness. Cardio: regular rate and rhythm, S1, S2 normal, no murmur, click, rub or gallop GI: soft, non-tender; bowel sounds normal; no masses,  no organomegaly Extremities: extremities normal, atraumatic, no cyanosis or edema  ECOG PERFORMANCE STATUS: 0 - Asymptomatic  Blood pressure 110/75, pulse 62, temperature (!) 96.5 F (35.8 C), temperature source Tympanic, resp. rate 18, height 5' 9"  (1.753 m), weight 147 lb 8 oz (66.9 kg), SpO2 100 %, unknown if currently breastfeeding.  LABORATORY DATA: Lab Results  Component Value Date   WBC 6.2 11/04/2020   HGB 14.6 11/04/2020   HCT 42.4 11/04/2020   MCV 92.0 11/04/2020   PLT 229 11/04/2020      Chemistry      Component Value Date/Time   NA 141 10/08/2020  1337   K 4.0 10/08/2020 1337   CL 107 10/08/2020 1337   CO2 26 10/08/2020 1337   BUN 17 10/08/2020 1337   CREATININE 1.02 (H) 10/08/2020 1337      Component Value Date/Time   CALCIUM 9.5 10/08/2020 1337   ALKPHOS 79 10/08/2020 1337   AST 35 10/08/2020 1337   ALT 25 10/08/2020 1337   BILITOT 0.4 10/08/2020 1337       RADIOGRAPHIC STUDIES: No results found.  ASSESSMENT AND PLAN: This is a very pleasant 44 years old white female with highly suspicious metastatic malignant melanoma presented with large mass in the left upper/left lower lobe and mediastinal lymphadenopathy as well as solitary brain metastasis status post left temporal craniotomy and resection of tumor on 04/29/2018 and she is recovering well from her surgery. The patient completed stereotactic radiotherapy to the resection cavity next week under the care of Dr. Lisbeth Renshaw. The patient underwent treatment with immunotherapy with Keytruda 200 mg IV every 3 weeks status post 3 cycles.   Her scan after cycle #3 showed enlargement of the left upper lobe lung mass.  This was also suspicious for pseudo-progression on immunotherapy.  The patient was started on a palliative course of radiotherapy to the left upper lobe lung mass and she tolerated this treatment fairly well.  She was seen by Dr. Emelda Brothers at [facility] and she recommended for the patient to resume her treatment with [facility] for now until she undergoes further molecular studies.  The patient was treated with 2 more cycles of Keytruda and tolerated the treatment well. Her recent CT scan of the chest, abdomen and pelvis showed some improvement in the left upper lobe lung mass but there is still concern about pericardial invasion. I personally and independently reviewed the scan images and discussed the result and showed the images to the patient and her boyfriend today.  She is still not a good candidate for surgical resection because of the pericardial invasion. The  final pathology report and recommendation from [facility] was consistent with metastatic melanoma and the recommendation is to switch the patient to a combination immunotherapy with Ipilumumab and nivolumab. The patient underwent treatment with immunotherapy with ipilimumab and nivolumab status post 2 cycles.  Her treatment was on hold for more than 6 weeks secondary to grade 3 hepatic dysfunction.  She had  improvement of her liver enzyme after a prolonged treatment with a steroid with a tapering schedule. She resumed her treatment again with single agent nivolumab.  She is status post 38 cycles of treatment. She continues to tolerate her treatment well with no concerning adverse effects. Starting from cycle #38 the patient will be treated with nivolumab 480 mg IV every 4 weeks. The patient has been tolerating this treatment well with no concerning adverse effects except for mild arthralgia after the last dose with the 4 weeks schedule. I recommended for her to proceed with cycle #39 today as planned. I discussed her case with Dr. Gershon Mussel, a melanoma expert at [facility] and he recommended consideration of stopping the treatment after the next PET scan if there is no clear evidence for residual or active disease. I discussed this option with Vernesha and she is in agreement with the plan. The patient will come back for follow-up visit in 4 weeks for evaluation before starting cycle #40. She was advised to call immediately if she has any concerning symptoms in the interval. The patient voices understanding of current disease status and treatment options and is in agreement with the current care plan. All questions were answered. The patient knows to call the clinic with any problems, questions or concerns. We can certainly see the patient much sooner if necessary.  Disclaimer: This note was dictated with voice recognition software. Similar sounding words can inadvertently be transcribed and  may not be corrected upon review.

## 2020-11-05 ENCOUNTER — Encounter: Payer: Self-pay | Admitting: Internal Medicine

## 2020-11-05 ENCOUNTER — Telehealth: Payer: Self-pay | Admitting: Internal Medicine

## 2020-11-05 NOTE — Telephone Encounter (Signed)
Scheduled per los. Called, not able to leave msg. Mailed printout

## 2020-11-12 ENCOUNTER — Encounter: Payer: Self-pay | Admitting: Internal Medicine

## 2020-11-12 ENCOUNTER — Ambulatory Visit (INDEPENDENT_AMBULATORY_CARE_PROVIDER_SITE_OTHER): Payer: BC Managed Care – PPO | Admitting: Internal Medicine

## 2020-11-12 ENCOUNTER — Other Ambulatory Visit: Payer: Self-pay

## 2020-11-12 VITALS — BP 100/68 | HR 63 | Temp 97.8°F | Ht 69.0 in | Wt 148.8 lb

## 2020-11-12 DIAGNOSIS — C7931 Secondary malignant neoplasm of brain: Secondary | ICD-10-CM

## 2020-11-12 DIAGNOSIS — C7802 Secondary malignant neoplasm of left lung: Secondary | ICD-10-CM

## 2020-11-12 DIAGNOSIS — Z1211 Encounter for screening for malignant neoplasm of colon: Secondary | ICD-10-CM

## 2020-11-12 DIAGNOSIS — Z23 Encounter for immunization: Secondary | ICD-10-CM | POA: Diagnosis not present

## 2020-11-12 NOTE — Progress Notes (Signed)
New Patient Office Visit     This visit occurred during the SARS-CoV-2 public health emergency.  Safety protocols were in place, including screening questions prior to the visit, additional usage of staff PPE, and extensive cleaning of exam room while observing appropriate contact time as indicated for disinfecting solutions.    CC/Reason for Visit: Establish care, discuss chronic medical conditions Previous PCP: None Last Visit: Routine follow-up with oncology but no recent primary care visits  HPI: Leslie [name] is a 44 y.o. female who is coming in today for the above mentioned reasons. Past Medical History has been unremarkable until May 2019 when after about a 5-day course of extreme fatigue she was diagnosed with what is now known to be malignant melanoma with brain and lung metastases.  She has received immunotherapy, radiation, she had a craniotomy.  She is under the care of Dr. Earlie Server.  Other than that she has no known medical conditions.  She has done remarkably well with treatment other than some mild hepatic derangements that required short-term cessation of treatment.  She has had her flu and Covid vaccines.  She does not know when her last Tdap was.  She has mammograms and cervical cancer screenings with her GYN.  She just turned 44 this year and is now due for her initial colon cancer screening.  She has no acute complaints.  She is married, she has 2 children ages 39 and 64.  Her family history is significant for father who was a heavy smoker and had lung cancer.  She does not smoke, she does not drink alcohol.  She has allergies to Diflucan which causes mouth edema.   Past Medical/Surgical History: Past Medical History:  Diagnosis Date  . 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 Migraines   . Yeast infection     Past Surgical History:  Procedure Laterality Date  . APPLICATION OF CRANIAL NAVIGATION Left 04/29/2018   Procedure: APPLICATION  OF CRANIAL NAVIGATION;  Surgeon: Earnie Larsson, MD;  Location: [facility];  Service: Neurosurgery;  Laterality: Left;  . CRANIOTOMY Left 04/29/2018   Procedure: LEFT CRANIOTOMY FOR  TUMOR BRAIN LAB;  Surgeon: Earnie Larsson, MD;  Location: [facility];  Service: Neurosurgery;  Laterality: Left;    Social History:  reports that she has never smoked. She has never used smokeless tobacco. She reports current alcohol use. She reports that she does not use drugs.  Allergies: Allergies  Allergen Reactions  . Diflucan [Fluconazole] Other (See Comments)    Wheezing and throat pressue    Family History:  Family History  Problem Relation Age of Onset  . Cancer Father        lung     Current Outpatient Medications:  .  AMERICAN GINSENG PO, Take 1,500-3,000 mg by mouth daily. Depending on level of fatigue, Disp: , Rfl:  .  Amino Acids (L-CARNITINE) LIQD, Take 2,000 mg by mouth daily. Liquid L-Carnitine 2000 mg, Disp: , Rfl:  .  Ascorbic Acid (LIQUID C 500) 500 MG/15ML LIQD, Take 1,000 mg by mouth daily., Disp: , Rfl:  .  Beta Glucan POWD, 1 Dose by Does not apply route daily. Beta D Glucan 400 mg, Disp: , Rfl:  .  Calcium-Magnesium (CAL-MAG PO), Take 15 mLs by mouth daily. Marine Based Cal - Mag (500 mg Calcium / 250 mg Mag) 15 ml total, Disp: , Rfl:  .  Coenzyme Q10 (COQ-10) 100 MG CAPS, Take 1 tablet by mouth  daily., Disp: , Rfl:  .  CREATINE MONOHYDRATE PO, Take 6 g by mouth daily. Creatine MonoHydrate 6 g (on training cycle), Disp: , Rfl:  .  doxylamine, Sleep, (UNISOM) 25 MG tablet, Take 12.5 mg by mouth at bedtime as needed for sleep., Disp: , Rfl:  .  ferrous sulfate 324 MG TBEC, Take 324 mg by mouth 2 (two) times daily., Disp: , Rfl:  .  Glucosamine-Chondroit-Vit C-Mn (GLUCOSAMINE 1500 COMPLEX PO), Take 1 tablet by mouth daily. Vegan CMO Glucosamine, Disp: , Rfl:  .  Hyaluronic Acid-Vitamin C (HYALURONIC ACID PO), Take 100 mg by mouth daily. Liquid Hyaluronic Acid 100 mg, Disp: , Rfl:  .  Iron-Folic  Acid-Vit O24 (IRON FORMULA PO), Take 1 Dose by mouth daily. Iron 50 mg (1/2 before workout and 1/2 afterwards - cycle 1 wk off and on), Disp: , Rfl:  .  Lysine POWD, 1,600 mg by Does not apply route daily. L-Lysine Powder 1600 mg, Disp: , Rfl:  .  Methylsulfonylmethane (MSM) 1000 MG CAPS, Take 4 capsules by mouth daily. 4000 mg (1/2 before workout and 1/2 afterwards), Disp: , Rfl:  .  Multiple Vitamin (MULTIVITAMIN) tablet, Take 1 tablet by mouth daily., Disp: , Rfl:  .  Multiple Vitamins-Minerals (MY-VITALIFE) CAPS, Take by mouth., Disp: , Rfl:  .  Multiple Vitamins-Minerals (ZINC PO), Take 1 tablet by mouth daily. Raw Zinc 30 mg (cycle every other week), Disp: , Rfl:  .  nivolumab (OPDIVO) 40 MG/4ML SOLN chemo injection, Opdivo 40 mg/4 mL intravenous solution  INFUSE 40 MG OVER 30 MINUTE(S) BY INTRAVENOUS ROUTE EVERY 2 WEEKS, Disp: , Rfl:  .  NON FORMULARY, Take 1 Dose by mouth daily. Cordyceps 750 mg, Disp: , Rfl:  .  NON FORMULARY, Take 1 Dose by mouth daily. Liver Herbal Supplement 675 mg, Disp: , Rfl:  .  NON FORMULARY, Take 1 Dose by mouth daily. Reishi Mushroom 500 mg, Disp: , Rfl:  .  NON FORMULARY, Take 1 Dose by mouth daily. Curcumin Phytosome (Bioperrine) 3000 mg, Disp: , Rfl:  .  NON FORMULARY, Take 1 tablet by mouth daily. Artemisnin 200 mg, Disp: , Rfl:  .  NON FORMULARY, Take 1 tablet by mouth daily. Wellness Herbal Tablet as needed (Elderberry, Ashwagandha, etc), Disp: , Rfl:  .  NON FORMULARY, Take 1 tablet by mouth daily. Zinc L Carnosine Complex 75/59 mg (after workout), Disp: , Rfl:  .  Omega 3-6-9 Fatty Acids (OMEGA-3-6-9 PO), Take 2 capsules by mouth daily. Plant Based Omega 3-6-9 2 capsules = 1825 mg, Disp: , Rfl:  .  OVER THE COUNTER MEDICATION, TK 5 MILLILITERS PO QID PRF MOUTH PAIN, Disp: , Rfl:  .  QUERCETIN PO, Take 1 tablet by mouth daily. Quercetin 400 mg / Bromelain 85 mg, Disp: , Rfl:  .  RESVERATROL PO, Take 1,200 mg by mouth daily. Trans Resveratrol, Disp: , Rfl:   .  SM Omega-3-6-9 Fatty Acids CAPS, Take by mouth., Disp: , Rfl:  .  VITAMIN A PO, Take 1 tablet by mouth daily. 1500 mcg (5000 IU), Disp: , Rfl:  .  Vitamin D-Vitamin K (VITAMIN K2-VITAMIN D3 PO), Take 1 capsule by mouth daily. 5000 IU + 50 mcg, Disp: , Rfl:  .  vitamin E 180 MG (400 UNITS) capsule, Take by mouth., Disp: , Rfl:  .  VITAMIN E PO, Take 1 tablet by mouth daily. Vegan Vitamin E, Disp: , Rfl:   Review of Systems:  Constitutional: Denies fever, chills, diaphoresis, appetite change and  fatigue.  HEENT: Denies photophobia, eye pain, redness, hearing loss, ear pain, congestion, sore throat, rhinorrhea, sneezing, mouth sores, trouble swallowing, neck pain, neck stiffness and tinnitus.   Respiratory: Denies SOB, DOE, cough, chest tightness,  and wheezing.   Cardiovascular: Denies chest pain, palpitations and leg swelling.  Gastrointestinal: Denies nausea, vomiting, abdominal pain, diarrhea, constipation, blood in stool and abdominal distention.  Genitourinary: Denies dysuria, urgency, frequency, hematuria, flank pain and difficulty urinating.  Endocrine: Denies: hot or cold intolerance, sweats, changes in hair or nails, polyuria, polydipsia. Musculoskeletal: Denies myalgias, back pain, joint swelling, arthralgias and gait problem.  Skin: Denies pallor, rash and wound.  Neurological: Denies dizziness, seizures, syncope, weakness, light-headedness, numbness and headaches.  Hematological: Denies adenopathy. Easy bruising, personal or family bleeding history  Psychiatric/Behavioral: Denies suicidal ideation, mood changes, confusion, nervousness, sleep disturbance and agitation    Physical Exam: Vitals:   11/12/20 1504  BP: 100/68  Pulse: 63  Temp: 97.8 F (36.6 C)  TempSrc: Oral  SpO2: 99%  Weight: 148 lb 12.8 oz (67.5 kg)  Height: 5' 9"  (1.753 m)   Body mass index is 21.97 kg/m.  Constitutional: NAD, calm, comfortable Eyes: PERRL, lids and conjunctivae normal ENMT: Mucous  membranes are moist.  Respiratory: clear to auscultation bilaterally, no wheezing, no crackles. Normal respiratory effort. No accessory muscle use.  Cardiovascular: Regular rate and rhythm, no murmurs / rubs / gallops. No extremity edema.  Neurologic: Grossly intact and nonfocal Psychiatric: Normal judgment and insight. Alert and oriented x 3. Normal mood.    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  Need for Tdap vaccination -Tdap administered today.  Colon cancer screening -GI referral today for initial colon cancer screening.  She will schedule follow-up visit for physical for fasting labs.    Patient Instructions  -Nice seeing you today!!  -Tetanus vaccine today.  -GI referral for screening colonoscopy will be made today.  -Schedule follow up for your physical. Please come in fasting that day.     Lelon Frohlich, MD [facility] at [facility]

## 2020-11-12 NOTE — Patient Instructions (Signed)
-  Nice seeing you today!!  -Tetanus vaccine today.  -GI referral for screening colonoscopy will be made today.  -Schedule follow up for your physical. Please come in fasting that day.

## 2020-11-20 DIAGNOSIS — F419 Anxiety disorder, unspecified: Secondary | ICD-10-CM | POA: Diagnosis not present

## 2020-11-22 ENCOUNTER — Other Ambulatory Visit: Payer: Self-pay

## 2020-11-22 ENCOUNTER — Ambulatory Visit (INDEPENDENT_AMBULATORY_CARE_PROVIDER_SITE_OTHER): Payer: BC Managed Care – PPO | Admitting: Internal Medicine

## 2020-11-22 ENCOUNTER — Encounter: Payer: Self-pay | Admitting: Internal Medicine

## 2020-11-22 VITALS — BP 100/64 | HR 59 | Temp 98.3°F | Ht 69.0 in | Wt 147.7 lb

## 2020-11-22 DIAGNOSIS — Z0000 Encounter for general adult medical examination without abnormal findings: Secondary | ICD-10-CM

## 2020-11-22 DIAGNOSIS — C7931 Secondary malignant neoplasm of brain: Secondary | ICD-10-CM

## 2020-11-22 DIAGNOSIS — C7802 Secondary malignant neoplasm of left lung: Secondary | ICD-10-CM

## 2020-11-22 LAB — CBC WITH DIFFERENTIAL/PLATELET
Basophils Absolute: 0 10*3/uL (ref 0.0–0.1)
Basophils Relative: 1.1 % (ref 0.0–3.0)
Eosinophils Absolute: 0.2 10*3/uL (ref 0.0–0.7)
Eosinophils Relative: 6.3 % — ABNORMAL HIGH (ref 0.0–5.0)
HCT: 43.2 % (ref 36.0–46.0)
Hemoglobin: 14.4 g/dL (ref 12.0–15.0)
Lymphocytes Relative: 27.5 % (ref 12.0–46.0)
Lymphs Abs: 0.9 10*3/uL (ref 0.7–4.0)
MCHC: 33.4 g/dL (ref 30.0–36.0)
MCV: 93.3 fl (ref 78.0–100.0)
Monocytes Absolute: 0.4 10*3/uL (ref 0.1–1.0)
Monocytes Relative: 12.8 % — ABNORMAL HIGH (ref 3.0–12.0)
Neutro Abs: 1.6 10*3/uL (ref 1.4–7.7)
Neutrophils Relative %: 52.3 % (ref 43.0–77.0)
Platelets: 267 10*3/uL (ref 150.0–400.0)
RBC: 4.63 Mil/uL (ref 3.87–5.11)
RDW: 13.3 % (ref 11.5–15.5)
WBC: 3.1 10*3/uL — ABNORMAL LOW (ref 4.0–10.5)

## 2020-11-22 LAB — COMPREHENSIVE METABOLIC PANEL
ALT: 23 U/L (ref 0–35)
AST: 27 U/L (ref 0–37)
Albumin: 4.3 g/dL (ref 3.5–5.2)
Alkaline Phosphatase: 61 U/L (ref 39–117)
BUN: 14 mg/dL (ref 6–23)
CO2: 29 mEq/L (ref 19–32)
Calcium: 9.7 mg/dL (ref 8.4–10.5)
Chloride: 106 mEq/L (ref 96–112)
Creatinine, Ser: 1.02 mg/dL (ref 0.40–1.20)
GFR: 66.64 mL/min (ref >60.00)
Glucose, Bld: 80 mg/dL (ref 70–99)
Potassium: 5.8 mEq/L — ABNORMAL HIGH (ref 3.5–5.1)
Sodium: 140 mEq/L (ref 135–145)
Total Bilirubin: 0.6 mg/dL (ref 0.2–1.2)
Total Protein: 6.8 g/dL (ref 6.0–8.3)

## 2020-11-22 LAB — LIPID PANEL
Cholesterol: 159 mg/dL (ref 0–200)
HDL: 59.9 mg/dL (ref >39.00)
LDL Cholesterol: 86 mg/dL (ref 0–99)
NonHDL: 99.58
Total CHOL/HDL Ratio: 3
Triglycerides: 67 mg/dL (ref 0.0–149.0)
VLDL: 13.4 mg/dL (ref 0.0–40.0)

## 2020-11-22 LAB — VITAMIN D 25 HYDROXY (VIT D DEFICIENCY, FRACTURES): VITD: 46.06 ng/mL (ref 30.00–100.00)

## 2020-11-22 LAB — HEMOGLOBIN A1C: Hgb A1c MFr Bld: 5 % (ref 4.6–6.5)

## 2020-11-22 LAB — TSH: TSH: 2.3 u[IU]/mL (ref 0.35–4.50)

## 2020-11-22 LAB — VITAMIN B12: Vitamin B-12: 779 pg/mL (ref 211–911)

## 2020-11-22 NOTE — Patient Instructions (Signed)
-  Nice seeing you today!!  -Lab work today; will notify you once results are available.  -Schedule follow up in 1 year or sooner as needed.

## 2020-11-22 NOTE — Progress Notes (Signed)
Established Patient Office Visit     This visit occurred during the SARS-CoV-2 public health emergency.  Safety protocols were in place, including screening questions prior to the visit, additional usage of staff PPE, and extensive cleaning of exam room while observing appropriate contact time as indicated for disinfecting solutions.    CC/Reason for Visit: Annual preventive exam  HPI: Leslie [name] is a 44 y.o. female who is coming in today for the above mentioned reasons. 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 Earlie Server.  Otherwise she has been in excellent health.  She has no acute complaints today.  She has routine eye and dental care, she is very physically active runs 15 miles 2 days a week and lifts weights 5 days a week.  All immunizations are up-to-date including Covid and flu.  She is due for screening colonoscopy.  She follows with GYN, had Pap smear and mammogram this year.   Past Medical/Surgical History: Past Medical History:  Diagnosis Date  . 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 Migraines   . Yeast infection     Past Surgical History:  Procedure Laterality Date  . APPLICATION OF CRANIAL NAVIGATION Left 04/29/2018   Procedure: APPLICATION OF CRANIAL NAVIGATION;  Surgeon: Earnie Larsson, MD;  Location: [place];  Service: Neurosurgery;  Laterality: Left;  . CRANIOTOMY Left 04/29/2018   Procedure: LEFT CRANIOTOMY FOR  TUMOR BRAIN LAB;  Surgeon: Earnie Larsson, MD;  Location: [place];  Service: Neurosurgery;  Laterality: Left;    Social History:  reports that she has never smoked. She has never used smokeless tobacco. She reports current alcohol use. She reports that she does not use drugs.  Allergies: Allergies  Allergen Reactions  . Diflucan [Fluconazole] Other (See Comments)    Wheezing and throat pressue    Family History:  Family History  Problem Relation Age of  Onset  . Cancer Father        lung     Current Outpatient Medications:  .  AMERICAN GINSENG PO, Take 1,500-3,000 mg by mouth daily. Depending on level of fatigue, Disp: , Rfl:  .  Amino Acids (L-CARNITINE) LIQD, Take 2,000 mg by mouth daily. Liquid L-Carnitine 2000 mg, Disp: , Rfl:  .  Ascorbic Acid 500 MG/15ML LIQD, Take 1,000 mg by mouth daily., Disp: , Rfl:  .  Beta Glucan POWD, 1 Dose by Does not apply route daily. Beta D Glucan 400 mg, Disp: , Rfl:  .  Calcium-Magnesium (CAL-MAG PO), Take 15 mLs by mouth daily. Marine Based Cal - Mag (500 mg Calcium / 250 mg Mag) 15 ml total, Disp: , Rfl:  .  Coenzyme Q10 (COQ-10) 100 MG CAPS, Take 1 tablet by mouth daily., Disp: , Rfl:  .  CREATINE MONOHYDRATE PO, Take 6 g by mouth daily. Creatine MonoHydrate 6 g (on training cycle), Disp: , Rfl:  .  doxylamine, Sleep, (UNISOM) 25 MG tablet, Take 12.5 mg by mouth at bedtime as needed for sleep., Disp: , Rfl:  .  ferrous sulfate 324 MG TBEC, Take 324 mg by mouth 2 (two) times daily., Disp: , Rfl:  .  Glucosamine-Chondroit-Vit C-Mn (GLUCOSAMINE 1500 COMPLEX PO), Take 1 tablet by mouth daily. Vegan CMO Glucosamine, Disp: , Rfl:  .  Hyaluronic Acid-Vitamin C (HYALURONIC ACID PO), Take 100 mg by mouth daily. Liquid Hyaluronic Acid 100 mg, Disp: , Rfl:  .  Iron-Folic Acid-Vit Q30 (  IRON FORMULA PO), Take 1 Dose by mouth daily. Iron 50 mg (1/2 before workout and 1/2 afterwards - cycle 1 wk off and on), Disp: , Rfl:  .  Lysine POWD, 1,600 mg by Does not apply route daily. L-Lysine Powder 1600 mg, Disp: , Rfl:  .  Methylsulfonylmethane (MSM) 1000 MG CAPS, Take 4 capsules by mouth daily. 4000 mg (1/2 before workout and 1/2 afterwards), Disp: , Rfl:  .  Multiple Vitamin (MULTIVITAMIN) tablet, Take 1 tablet by mouth daily., Disp: , Rfl:  .  Multiple Vitamins-Minerals (MY-VITALIFE) CAPS, Take by mouth., Disp: , Rfl:  .  Multiple Vitamins-Minerals (ZINC PO), Take 1 tablet by mouth daily. Raw Zinc 30 mg (cycle every  other week), Disp: , Rfl:  .  nivolumab (OPDIVO) 40 MG/4ML SOLN chemo injection, Opdivo 40 mg/4 mL intravenous solution  INFUSE 40 MG OVER 30 MINUTE(S) BY INTRAVENOUS ROUTE EVERY 2 WEEKS, Disp: , Rfl:  .  NON FORMULARY, Take 1 Dose by mouth daily. Cordyceps 750 mg, Disp: , Rfl:  .  NON FORMULARY, Take 1 Dose by mouth daily. Liver Herbal Supplement 675 mg, Disp: , Rfl:  .  NON FORMULARY, Take 1 Dose by mouth daily. Reishi Mushroom 500 mg, Disp: , Rfl:  .  NON FORMULARY, Take 1 Dose by mouth daily. Curcumin Phytosome (Bioperrine) 3000 mg, Disp: , Rfl:  .  NON FORMULARY, Take 1 tablet by mouth daily. Artemisnin 200 mg, Disp: , Rfl:  .  NON FORMULARY, Take 1 tablet by mouth daily. Wellness Herbal Tablet as needed (Elderberry, Ashwagandha, etc), Disp: , Rfl:  .  NON FORMULARY, Take 1 tablet by mouth daily. Zinc L Carnosine Complex 75/59 mg (after workout), Disp: , Rfl:  .  Omega 3-6-9 Fatty Acids (OMEGA-3-6-9 PO), Take 2 capsules by mouth daily. Plant Based Omega 3-6-9 2 capsules = 1825 mg, Disp: , Rfl:  .  OVER THE COUNTER MEDICATION, TK 5 MILLILITERS PO QID PRF MOUTH PAIN, Disp: , Rfl:  .  QUERCETIN PO, Take 1 tablet by mouth daily. Quercetin 400 mg / Bromelain 85 mg, Disp: , Rfl:  .  RESVERATROL PO, Take 1,200 mg by mouth daily. Trans Resveratrol, Disp: , Rfl:  .  SM Omega-3-6-9 Fatty Acids CAPS, Take by mouth., Disp: , Rfl:  .  VITAMIN A PO, Take 1 tablet by mouth daily. 1500 mcg (5000 IU), Disp: , Rfl:  .  Vitamin D-Vitamin K (VITAMIN K2-VITAMIN D3 PO), Take 1 capsule by mouth daily. 5000 IU + 50 mcg, Disp: , Rfl:  .  vitamin E 180 MG (400 UNITS) capsule, Take by mouth., Disp: , Rfl:  .  VITAMIN E PO, Take 1 tablet by mouth daily. Vegan Vitamin E, Disp: , Rfl:   Review of Systems:  Constitutional: Denies fever, chills, diaphoresis, appetite change and fatigue.  HEENT: Denies photophobia, eye pain, redness, hearing loss, ear pain, congestion, sore throat, rhinorrhea, sneezing, mouth sores,  trouble swallowing, neck pain, neck stiffness and tinnitus.   Respiratory: Denies SOB, DOE, cough, chest tightness,  and wheezing.   Cardiovascular: Denies chest pain, palpitations and leg swelling.  Gastrointestinal: Denies nausea, vomiting, abdominal pain, diarrhea, constipation, blood in stool and abdominal distention.  Genitourinary: Denies dysuria, urgency, frequency, hematuria, flank pain and difficulty urinating.  Endocrine: Denies: hot or cold intolerance, sweats, changes in hair or nails, polyuria, polydipsia. Musculoskeletal: Denies myalgias, back pain, joint swelling, arthralgias and gait problem.  Skin: Denies pallor, rash and wound.  Neurological: Denies dizziness, seizures, syncope, weakness, light-headedness, numbness and  headaches.  Hematological: Denies adenopathy. Easy bruising, personal or family bleeding history  Psychiatric/Behavioral: Denies suicidal ideation, mood changes, confusion, nervousness, sleep disturbance and agitation    Physical Exam: Vitals:   11/22/20 0745  BP: 100/64  Pulse: (!) 59  Temp: 98.3 F (36.8 C)  TempSrc: Oral  SpO2: 99%  Weight: 147 lb 11.2 oz (67 kg)  Height: 5' 9"  (1.753 m)    Body mass index is 21.81 kg/m.   Constitutional: NAD, calm, comfortable Eyes: PERRL, lids and conjunctivae normal ENMT: Mucous membranes are moist. Posterior pharynx clear of any exudate or lesions. Normal dentition. Tympanic membrane is pearly white, no erythema or bulging. Neck: normal, supple, no masses, no thyromegaly Respiratory: clear to auscultation bilaterally, no wheezing, no crackles. Normal respiratory effort. No accessory muscle use.  Cardiovascular: Regular rate and rhythm, no murmurs / rubs / gallops. No extremity edema. 2+ pedal pulses.  Abdomen: no tenderness, no masses palpated. No hepatosplenomegaly. Bowel sounds positive.  Musculoskeletal: no clubbing / cyanosis. No joint deformity upper and lower extremities. Good ROM, no contractures.  Normal muscle tone.  Skin: no rashes, lesions, ulcers. No induration Neurologic: CN 2-12 grossly intact. Sensation intact, DTR normal. Strength 5/5 in all 4.  Psychiatric: Normal judgment and insight. Alert and oriented x 3. Normal mood.    Impression and Plan:  Encounter for preventive health examination  -She has routine eye and dental care. -All immunizations are up-to-date and age-appropriate. -Screening labs today. -Healthy lifestyle discussed in detail. -Refer to GI for colon cancer screening. -She has Pap smears and mammograms with GYN, done earlier this year.  Melanoma metastatic to left lung [facility]) Malignant melanoma metastatic to brain [facility]) -Noted, followed by oncology.    Patient Instructions  -Nice seeing you today!!  -Lab work today; will notify you once results are available.  -Schedule follow up in 1 year or sooner as needed.     Lelon Frohlich, MD [facility] at [facility]

## 2020-11-22 NOTE — Addendum Note (Signed)
Addended by: Marrion Coy on: 11/22/2020 08:00 AM   Modules accepted: Orders

## 2020-11-28 ENCOUNTER — Telehealth: Payer: Self-pay | Admitting: Internal Medicine

## 2020-11-28 NOTE — Telephone Encounter (Signed)
Patient is returning Rachel's call and is requesting a call back.

## 2020-11-29 ENCOUNTER — Encounter: Payer: Self-pay | Admitting: Internal Medicine

## 2020-11-29 ENCOUNTER — Telehealth: Payer: Self-pay

## 2020-11-29 NOTE — Telephone Encounter (Signed)
Discussed with Cassie, PA-C who advised pt has had ongoing chest pressure and may require a cardiac work-up. It is recommended the pt go to the ER.  I spoke with pt and advised of this. Pt does not want to go to the ER as she doesn't want to be around people. I advised the pt we cannot complete a cardiac work-up here in the [facility] and the recommendation is to go to the ER. I also advised she has the option to see her PCP or Urgent Care but that is her choice as we have already discussed our recommendation. She expressed understanding of this information.

## 2020-11-29 NOTE — Telephone Encounter (Signed)
Duplicate. Documentation has been entered in the 11/29/20 pt message.

## 2020-11-30 DIAGNOSIS — Z20822 Contact with and (suspected) exposure to covid-19: Secondary | ICD-10-CM | POA: Diagnosis not present

## 2020-12-02 ENCOUNTER — Inpatient Hospital Stay: Payer: BC Managed Care – PPO

## 2020-12-02 ENCOUNTER — Inpatient Hospital Stay: Payer: BC Managed Care – PPO | Attending: Physician Assistant

## 2020-12-02 ENCOUNTER — Other Ambulatory Visit: Payer: Self-pay

## 2020-12-02 ENCOUNTER — Encounter: Payer: Self-pay | Admitting: Internal Medicine

## 2020-12-02 ENCOUNTER — Inpatient Hospital Stay (HOSPITAL_BASED_OUTPATIENT_CLINIC_OR_DEPARTMENT_OTHER): Payer: BC Managed Care – PPO | Admitting: Internal Medicine

## 2020-12-02 VITALS — BP 120/86 | HR 69 | Temp 96.3°F | Resp 18 | Ht 69.0 in | Wt 149.2 lb

## 2020-12-02 DIAGNOSIS — C7802 Secondary malignant neoplasm of left lung: Secondary | ICD-10-CM | POA: Diagnosis not present

## 2020-12-02 DIAGNOSIS — C7931 Secondary malignant neoplasm of brain: Secondary | ICD-10-CM | POA: Insufficient documentation

## 2020-12-02 DIAGNOSIS — Z5112 Encounter for antineoplastic immunotherapy: Secondary | ICD-10-CM | POA: Insufficient documentation

## 2020-12-02 DIAGNOSIS — Z79899 Other long term (current) drug therapy: Secondary | ICD-10-CM | POA: Diagnosis not present

## 2020-12-02 DIAGNOSIS — Z923 Personal history of irradiation: Secondary | ICD-10-CM | POA: Diagnosis not present

## 2020-12-02 DIAGNOSIS — C439 Malignant melanoma of skin, unspecified: Secondary | ICD-10-CM | POA: Insufficient documentation

## 2020-12-02 DIAGNOSIS — C3432 Malignant neoplasm of lower lobe, left bronchus or lung: Secondary | ICD-10-CM | POA: Diagnosis not present

## 2020-12-02 LAB — CBC WITH DIFFERENTIAL (CANCER CENTER ONLY)
Abs Immature Granulocytes: 0.01 10*3/uL (ref 0.00–0.07)
Basophils Absolute: 0 10*3/uL (ref 0.0–0.1)
Basophils Relative: 1 %
Eosinophils Absolute: 0.1 10*3/uL (ref 0.0–0.5)
Eosinophils Relative: 2 %
HCT: 40.6 % (ref 36.0–46.0)
Hemoglobin: 13.7 g/dL (ref 12.0–15.0)
Immature Granulocytes: 0 %
Lymphocytes Relative: 19 %
Lymphs Abs: 0.7 10*3/uL (ref 0.7–4.0)
MCH: 31.2 pg (ref 26.0–34.0)
MCHC: 33.7 g/dL (ref 30.0–36.0)
MCV: 92.5 fL (ref 80.0–100.0)
Monocytes Absolute: 0.5 10*3/uL (ref 0.1–1.0)
Monocytes Relative: 13 %
Neutro Abs: 2.4 10*3/uL (ref 1.7–7.7)
Neutrophils Relative %: 65 %
Platelet Count: 219 10*3/uL (ref 150–400)
RBC: 4.39 MIL/uL (ref 3.87–5.11)
RDW: 12.7 % (ref 11.5–15.5)
WBC Count: 3.7 10*3/uL — ABNORMAL LOW (ref 4.0–10.5)
nRBC: 0 % (ref 0.0–0.2)

## 2020-12-02 LAB — CMP (CANCER CENTER ONLY)
ALT: 27 U/L (ref 0–44)
AST: 34 U/L (ref 15–41)
Albumin: 4.1 g/dL (ref 3.5–5.0)
Alkaline Phosphatase: 74 U/L (ref 38–126)
Anion gap: 9 (ref 5–15)
BUN: 14 mg/dL (ref 6–20)
CO2: 23 mmol/L (ref 22–32)
Calcium: 9.1 mg/dL (ref 8.9–10.3)
Chloride: 107 mmol/L (ref 98–111)
Creatinine: 0.96 mg/dL (ref 0.44–1.00)
GFR, Estimated: >60 mL/min (ref >60)
Glucose, Bld: 69 mg/dL — ABNORMAL LOW (ref 70–99)
Potassium: 3.9 mmol/L (ref 3.5–5.1)
Sodium: 139 mmol/L (ref 135–145)
Total Bilirubin: 0.4 mg/dL (ref 0.3–1.2)
Total Protein: 7 g/dL (ref 6.5–8.1)

## 2020-12-02 LAB — TSH: TSH: 2.33 u[IU]/mL (ref 0.308–3.960)

## 2020-12-02 MED ORDER — SODIUM CHLORIDE 0.9 % IV SOLN
480.0000 mg | Freq: Once | INTRAVENOUS | Status: AC
  Administered 2020-12-02: 480 mg via INTRAVENOUS
  Filled 2020-12-02: qty 48

## 2020-12-02 MED ORDER — SODIUM CHLORIDE 0.9 % IV SOLN
Freq: Once | INTRAVENOUS | Status: AC
  Filled 2020-12-02: qty 250

## 2020-12-02 NOTE — Progress Notes (Signed)
[facility] [phone]   [phone]  OFFICE PROGRESS NOTE  Isaac Bliss, Rayford Halsted, MD [place] [address]  DIAGNOSIS: Metastatic high-grade neoplasm consistent with metastatic malignant melanoma based on the most recent pathology report from [facility] in October 2019, presented with large left left upper/left lower lobe mass with a hypermetabolic AP window lymph node as well as solitary metastatic brain lesion diagnosed in May 2019.  Biomarker Findings Microsatellite status - MS-Stable Tumor Mutational Burden - TMB-Intermediate (16 Muts/Mb) Genomic Findings For a complete list of the genes assayed, please refer to the Appendix. CD274 (PD-L1) amplification NRAS Q61R PDCD1LG2 (PD-L2) amplification MYC amplification EPHB1 amplification JAK2 amplification RB1 Q93*, P233* TERT promoter -146C>T TP53 C275W   PRIOR THERAPY: 1) left temporal craniotomy with resection of tumor with intraoperative stereotactic guidance for volumetric resection under the care of Dr. Annette Stable on 04/29/2018. 2) First-line treatment with immunotherapy with Keytruda 200 mg IV every 3 weeks.  First dose June 02, 2018.  Status post 3 cycles.  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 3) palliative radiotherapy to the left lung mass under the care of Dr. Lisbeth Renshaw completed on 08/24/2018. 4) Resuming her treatment again with Keytruda 200 mg IV every 3 weeks, first dose 09/08/2018.  Status post 2 cycles.  CURRENT THERAPY: Treatment with immunotherapy with ipilimumab 3 mg/KG and nivolumab 1 mg/KG every 3 weeks for the first 4 cycles followed by maintenance nivolumab 240 mg IV every 2 weeks.  First dose of this treatment October 26, 2018.  Status post 37 cycles.  Ipilimumab was discontinued after cycle #2 for the significant liver dysfunction.  Starting from cycle #3 the patient is on treatment with single  agent nivolumab. Starting from cycle #39 the patient is switching to nivolumab 480 mg IV every 4 weeks.  INTERVAL HISTORY: Leslie [name] 44 y.o. female returns to the clinic today for follow-up visit.  The patient is feeling fine today with no concerning complaints.  She had an episode of chest pain few days ago but this is completely resolved.  It was not associated with any shortness of breath, cough or diaphoresis.  The patient is feeling well today.  She has no nausea, vomiting, diarrhea or constipation.  She denied having any headache or visual changes.  She has no recent weight loss or night sweats.  She is here today for evaluation before starting cycle #40 of her treatment.  MEDICAL HISTORY: Past Medical History:  Diagnosis Date  . 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 Migraines   . Yeast infection     ALLERGIES:  is allergic to diflucan [fluconazole].  MEDICATIONS:  Current Outpatient Medications  Medication Sig Dispense Refill  . AMERICAN GINSENG PO Take 1,500-3,000 mg by mouth daily. Depending on level of fatigue    . Amino Acids (L-CARNITINE) LIQD Take 2,000 mg by mouth daily. Liquid L-Carnitine 2000 mg    . Ascorbic Acid 500 MG/15ML LIQD Take 1,000 mg by mouth daily.    . Beta Glucan POWD 1 Dose by Does not apply route daily. Beta D Glucan 400 mg    . Calcium-Magnesium (CAL-MAG PO) Take 15 mLs by mouth daily. Marine Based Cal - Mag (500 mg Calcium / 250 mg Mag) 15 ml total    . Coenzyme Q10 (COQ-10) 100 MG CAPS Take 1 tablet by mouth daily.    Marland Kitchen CREATINE MONOHYDRATE PO Take 6  g by mouth daily. Creatine MonoHydrate 6 g (on training cycle)    . doxylamine, Sleep, (UNISOM) 25 MG tablet Take 12.5 mg by mouth at bedtime as needed for sleep.    . ferrous sulfate 324 MG TBEC Take 324 mg by mouth 2 (two) times daily.    . Glucosamine-Chondroit-Vit C-Mn (GLUCOSAMINE 1500 COMPLEX PO) Take 1 tablet by mouth daily. Vegan CMO Glucosamine    . Hyaluronic  Acid-Vitamin C (HYALURONIC ACID PO) Take 100 mg by mouth daily. Liquid Hyaluronic Acid 100 mg    . Iron-Folic Acid-Vit I69 (IRON FORMULA PO) Take 1 Dose by mouth daily. Iron 50 mg (1/2 before workout and 1/2 afterwards - cycle 1 wk off and on)    . Lysine POWD 1,600 mg by Does not apply route daily. L-Lysine Powder 1600 mg    . Methylsulfonylmethane (MSM) 1000 MG CAPS Take 4 capsules by mouth daily. 4000 mg (1/2 before workout and 1/2 afterwards)    . Multiple Vitamin (MULTIVITAMIN) tablet Take 1 tablet by mouth daily.    . Multiple Vitamins-Minerals (MY-VITALIFE) CAPS Take by mouth.    . Multiple Vitamins-Minerals (ZINC PO) Take 1 tablet by mouth daily. Raw Zinc 30 mg (cycle every other week)    . nivolumab (OPDIVO) 40 MG/4ML SOLN chemo injection Opdivo 40 mg/4 mL intravenous solution  INFUSE 40 MG OVER 30 MINUTE(S) BY INTRAVENOUS ROUTE EVERY 2 WEEKS    . NON FORMULARY Take 1 Dose by mouth daily. Cordyceps 750 mg    . NON FORMULARY Take 1 Dose by mouth daily. Liver Herbal Supplement 675 mg    . NON FORMULARY Take 1 Dose by mouth daily. Reishi Mushroom 500 mg    . NON FORMULARY Take 1 Dose by mouth daily. Curcumin Phytosome (Bioperrine) 3000 mg    . NON FORMULARY Take 1 tablet by mouth daily. Artemisnin 200 mg    . NON FORMULARY Take 1 tablet by mouth daily. Wellness Herbal Tablet as needed (Elderberry, Ashwagandha, etc)    . NON FORMULARY Take 1 tablet by mouth daily. Zinc L Carnosine Complex 75/59 mg (after workout)    . Omega 3-6-9 Fatty Acids (OMEGA-3-6-9 PO) Take 2 capsules by mouth daily. Plant Based Omega 3-6-9 2 capsules = 1825 mg    . OVER THE COUNTER MEDICATION TK 5 MILLILITERS PO QID PRF MOUTH PAIN    . QUERCETIN PO Take 1 tablet by mouth daily. Quercetin 400 mg / Bromelain 85 mg    . RESVERATROL PO Take 1,200 mg by mouth daily. Trans Resveratrol    . SM Omega-3-6-9 Fatty Acids CAPS Take by mouth.    Marland Kitchen VITAMIN A PO Take 1 tablet by mouth daily. 1500 mcg (5000 IU)    . Vitamin  D-Vitamin K (VITAMIN K2-VITAMIN D3 PO) Take 1 capsule by mouth daily. 5000 IU + 50 mcg    . vitamin E 180 MG (400 UNITS) capsule Take by mouth.    Marland Kitchen VITAMIN E PO Take 1 tablet by mouth daily. Vegan Vitamin E     No current facility-administered medications for this visit.    SURGICAL HISTORY:  Past Surgical History:  Procedure Laterality Date  . APPLICATION OF CRANIAL NAVIGATION Left 04/29/2018   Procedure: APPLICATION OF CRANIAL NAVIGATION;  Surgeon: Earnie Larsson, MD;  Location: [place];  Service: Neurosurgery;  Laterality: Left;  . CRANIOTOMY Left 04/29/2018   Procedure: LEFT CRANIOTOMY FOR  TUMOR BRAIN LAB;  Surgeon: Earnie Larsson, MD;  Location: [place];  Service: Neurosurgery;  Laterality:  Left;    REVIEW OF SYSTEMS:  A comprehensive review of systems was negative.   PHYSICAL EXAMINATION: General appearance: alert, cooperative and no distress Head: Normocephalic, without obvious abnormality, atraumatic Neck: no adenopathy, no JVD, supple, symmetrical, trachea midline and thyroid not enlarged, symmetric, no tenderness/mass/nodules Lymph nodes: Cervical, supraclavicular, and axillary nodes normal. Resp: clear to auscultation bilaterally Back: negative, symmetric, no curvature. ROM normal. No CVA tenderness. Cardio: regular rate and rhythm, S1, S2 normal, no murmur, click, rub or gallop GI: soft, non-tender; bowel sounds normal; no masses,  no organomegaly Extremities: extremities normal, atraumatic, no cyanosis or edema  ECOG PERFORMANCE STATUS: 0 - Asymptomatic  Blood pressure 120/86, pulse 69, temperature (!) 96.3 F (35.7 C), temperature source Tympanic, resp. rate 18, height 5' 9" (1.753 m), weight 149 lb 3.2 oz (67.7 kg), SpO2 100 %, unknown if currently breastfeeding.  LABORATORY DATA: Lab Results  Component Value Date   WBC 3.7 (L) 12/02/2020   HGB 13.7 12/02/2020   HCT 40.6 12/02/2020   MCV 92.5 12/02/2020   PLT 219 12/02/2020      Chemistry      Component Value  Date/Time   NA 139 12/02/2020 0910   K 3.9 12/02/2020 0910   CL 107 12/02/2020 0910   CO2 23 12/02/2020 0910   BUN 14 12/02/2020 0910   CREATININE 0.96 12/02/2020 0910      Component Value Date/Time   CALCIUM 9.1 12/02/2020 0910   ALKPHOS 74 12/02/2020 0910   AST 34 12/02/2020 0910   ALT 27 12/02/2020 0910   BILITOT 0.4 12/02/2020 0910       RADIOGRAPHIC STUDIES: No results found.  ASSESSMENT AND PLAN: This is a very pleasant 44 years old white female with highly suspicious metastatic malignant melanoma presented with large mass in the left upper/left lower lobe and mediastinal lymphadenopathy as well as solitary brain metastasis status post left temporal craniotomy and resection of tumor on 04/29/2018 and she is recovering well from her surgery. The patient completed stereotactic radiotherapy to the resection cavity next week under the care of Dr. Lisbeth Renshaw. The patient underwent treatment with immunotherapy with Keytruda 200 mg IV every 3 weeks status post 3 cycles.   Her scan after cycle #3 showed enlargement of the left upper lobe lung mass.  This was also suspicious for pseudo-progression on immunotherapy.  The patient was started on a palliative course of radiotherapy to the left upper lobe lung mass and she tolerated this treatment fairly well.  She was seen by Dr. Emelda Brothers at [facility] and she recommended for the patient to resume her treatment with [facility] for now until she undergoes further molecular studies.  The patient was treated with 2 more cycles of Keytruda and tolerated the treatment well. Her recent CT scan of the chest, abdomen and pelvis showed some improvement in the left upper lobe lung mass but there is still concern about pericardial invasion. I personally and independently reviewed the scan images and discussed the result and showed the images to the patient and her boyfriend today.  She is still not a good candidate for surgical resection because of the  pericardial invasion. The final pathology report and recommendation from [facility] was consistent with metastatic melanoma and the recommendation is to switch the patient to a combination immunotherapy with Ipilumumab and nivolumab. The patient underwent treatment with immunotherapy with ipilimumab and nivolumab status post 2 cycles.  Her treatment was on hold for more than 6 weeks secondary to grade 3 hepatic  dysfunction.  She had improvement of her liver enzyme after a prolonged treatment with a steroid with a tapering schedule. She resumed her treatment again with single agent nivolumab.  She is status post 39 cycles of treatment. She continues to tolerate her treatment well with no concerning adverse effects. Starting from cycle #39 the patient will be treated with nivolumab 480 mg IV every 4 weeks. The patient continues to tolerate this treatment well with no concerning adverse effects. I recommended for her to proceed with cycle #40 today as planned. I will see her back for follow-up visit in 4 weeks for evaluation after repeating PET scan for restaging of her disease. I discussed her case with Dr. Gershon Mussel, a melanoma expert at [facility] and he recommended consideration of stopping the treatment after the next PET scan if there is no clear evidence for residual or active disease. I discussed this option with Leslie [name] and she is in agreement with the plan. She was advised to call immediately if she has any concerning symptoms in the interval. The patient voices understanding of current disease status and treatment options and is in agreement with the current care plan. All questions were answered. The patient knows to call the clinic with any problems, questions or concerns. We can certainly see the patient much sooner if necessary.  Disclaimer: This note was dictated with voice recognition software. Similar sounding words can inadvertently be transcribed and may not be corrected  upon review.

## 2020-12-02 NOTE — Patient Instructions (Signed)
[facility] Discharge Instructions for Patients Receiving Chemotherapy  Today you received the following chemotherapy agents Opdivo  To help prevent nausea and vomiting after your treatment, we encourage you to take your nausea medication as directed.    If you develop nausea and vomiting that is not controlled by your nausea medication, call the clinic.   BELOW ARE SYMPTOMS THAT SHOULD BE REPORTED IMMEDIATELY:  *FEVER GREATER THAN 100.5 F  *CHILLS WITH OR WITHOUT FEVER  NAUSEA AND VOMITING THAT IS NOT CONTROLLED WITH YOUR NAUSEA MEDICATION  *UNUSUAL SHORTNESS OF BREATH  *UNUSUAL BRUISING OR BLEEDING  TENDERNESS IN MOUTH AND THROAT WITH OR WITHOUT PRESENCE OF ULCERS  *URINARY PROBLEMS  *BOWEL PROBLEMS  UNUSUAL RASH Items with * indicate a potential emergency and should be followed up as soon as possible.  Feel free to call the clinic should you have any questions or concerns. The clinic phone number is [phone].  Please show the [facility] at check-in to the Emergency Department and triage nurse.

## 2020-12-03 ENCOUNTER — Telehealth: Payer: Self-pay | Admitting: Internal Medicine

## 2020-12-03 NOTE — Telephone Encounter (Signed)
Scheduled appts per 12/20 los. Pt to get updated appt calendar at next visit per appt notes.

## 2020-12-12 DIAGNOSIS — M2560 Stiffness of unspecified joint, not elsewhere classified: Secondary | ICD-10-CM | POA: Diagnosis not present

## 2020-12-12 DIAGNOSIS — M9906 Segmental and somatic dysfunction of lower extremity: Secondary | ICD-10-CM | POA: Diagnosis not present

## 2020-12-12 DIAGNOSIS — M9902 Segmental and somatic dysfunction of thoracic region: Secondary | ICD-10-CM | POA: Diagnosis not present

## 2020-12-12 DIAGNOSIS — M654 Radial styloid tenosynovitis [de Quervain]: Secondary | ICD-10-CM | POA: Diagnosis not present

## 2020-12-14 DIAGNOSIS — J939 Pneumothorax, unspecified: Secondary | ICD-10-CM

## 2020-12-14 HISTORY — DX: Pneumothorax, unspecified: J93.9

## 2020-12-23 ENCOUNTER — Encounter: Payer: Self-pay | Admitting: Internal Medicine

## 2020-12-26 ENCOUNTER — Other Ambulatory Visit: Payer: Self-pay

## 2020-12-26 ENCOUNTER — Ambulatory Visit (HOSPITAL_COMMUNITY)
Admission: RE | Admit: 2020-12-26 | Discharge: 2020-12-26 | Disposition: A | Discharge status: Home / Self Care | Payer: BC Managed Care – PPO | Source: Ambulatory Visit | Attending: Physician Assistant | Admitting: Physician Assistant

## 2020-12-26 ENCOUNTER — Inpatient Hospital Stay
Admission: AD | Admit: 2020-12-26 | Payer: BC Managed Care – PPO | Source: Ambulatory Visit | Admitting: Cardiothoracic Surgery

## 2020-12-26 ENCOUNTER — Telehealth: Payer: Self-pay | Admitting: Physician Assistant

## 2020-12-26 ENCOUNTER — Telehealth: Payer: Self-pay | Admitting: Cardiothoracic Surgery

## 2020-12-26 ENCOUNTER — Other Ambulatory Visit (HOSPITAL_COMMUNITY)
Admission: RE | Admit: 2020-12-26 | Discharge: 2020-12-26 | Disposition: A | Discharge status: Home / Self Care | Payer: BC Managed Care – PPO | Source: Ambulatory Visit | Attending: Cardiothoracic Surgery | Admitting: Cardiothoracic Surgery

## 2020-12-26 ENCOUNTER — Encounter: Payer: Self-pay | Admitting: Internal Medicine

## 2020-12-26 ENCOUNTER — Telehealth (HOSPITAL_COMMUNITY): Payer: Self-pay

## 2020-12-26 ENCOUNTER — Other Ambulatory Visit (HOSPITAL_COMMUNITY): Payer: Self-pay | Admitting: Cardiothoracic Surgery

## 2020-12-26 ENCOUNTER — Other Ambulatory Visit: Payer: Self-pay | Admitting: Cardiothoracic Surgery

## 2020-12-26 DIAGNOSIS — C7802 Secondary malignant neoplasm of left lung: Secondary | ICD-10-CM | POA: Diagnosis not present

## 2020-12-26 DIAGNOSIS — J90 Pleural effusion, not elsewhere classified: Secondary | ICD-10-CM | POA: Diagnosis not present

## 2020-12-26 DIAGNOSIS — C439 Malignant melanoma of skin, unspecified: Secondary | ICD-10-CM | POA: Diagnosis not present

## 2020-12-26 DIAGNOSIS — Z20822 Contact with and (suspected) exposure to covid-19: Secondary | ICD-10-CM | POA: Diagnosis not present

## 2020-12-26 DIAGNOSIS — S270XXA Traumatic pneumothorax, initial encounter: Secondary | ICD-10-CM

## 2020-12-26 LAB — GLUCOSE, CAPILLARY: Glucose-Capillary: 84 mg/dL (ref 70–99)

## 2020-12-26 MED ORDER — FLUDEOXYGLUCOSE F - 18 (FDG) INJECTION
7.4200 | Freq: Once | INTRAVENOUS | Status: AC | PRN
  Administered 2020-12-26: 7.42 via INTRAVENOUS

## 2020-12-26 NOTE — Telephone Encounter (Signed)
The patient had a PET scan performed earlier today which noted a moderate to large left-sided pneumothorax without cardiomediastinal shift and a left 8th rib fracture. Of note, the patient is a heavy weight lifter. The results with discussed with Dr. Julien Nordmann. Dr. Julien Nordmann spoke to Dr. Servando Snare, [place] surgeon, who had seen the patient in the past. They recommended sending the patient to the [facility] ER for further evaluation and consideration of catheter placement. Dr. Servando Snare is aware of her and would follow her afterwards. I called the patient with the above information. She will go to the emergency room. I will call the ER to alert them of this patient.

## 2020-12-26 NOTE — Telephone Encounter (Signed)
-----   Message from Arne Cleveland, MD sent at 12/26/2020  3:56 PM EST ----- Regarding: RE: Chest tube placement Ok  CT chest tube  LEFT  Per Servando Snare note:  I talked to interventional radiology, a outpatient CT-guided placement of left pigtail catheter is arranged for tomorrow morning.  We we will place at [facility]-mini express to provide drainage and function as a Heimlich valve.     DDH    ----- Message ----- From: Danielle Dess Sent: 12/26/2020   3:48 PM EST To: Ir Procedure Requests Subject: Chest tube placement                           Procedure: Chest tube  Dx: traumatic pneumothorax  Ordering: Dr. Servando Snare [phone]  Imaging: NM Pet 12/26/20  Please review.   Thanks,  Lia Foyer

## 2020-12-26 NOTE — Telephone Encounter (Signed)
      [address]       [place] [address]             [phone]    Received call from Dr. Julien Nordmann after the patient had regularly scheduled PET scan completed today.  PET scan was significant for a apical anterior 30% pneumothorax, and an area in the left posterior eighth rib that was hypermetabolic and suggestive of a fracture.  I called the patient and discussed with her her symptoms she notes that on and off since September she has had left chest pain, especially over the area where the rib is hypermetabolic.  However she is remained totally functional exercising on a daily basis including lifting weights.  She denies any specific trauma to the ribs.  Currently her respiratory status is stable.  Because of the lack of ability to provide care through the emergency room, and a backlog of admissions to the hospital greater than 35 patient's-waiting 1 to 2 days I discussed with her trying to deal with the current situation as an outpatient.  She has had a COVID test done this morning.  I talked to interventional radiology, a outpatient CT-guided placement of left pigtail catheter is arranged for tomorrow morning.  We we will place at [facility]-mini express to provide drainage and function as a Heimlich valve.  Made arrangements for the patient to then return to the office on Tuesday, January 18 after obtaining a chest x-ray PA lateral and including left posterior rib details.-I explained to her we will need to decide if the area in the rib is a pathologic fracture that should be treated with radiation for pain relief for is a traumatic lesion.  The patient was comfortable with this plan.  She is aware should she have any sudden change in symptoms especially increasing difficulty breathing, to call 911 and go to the emergency room immediately.  Grace Isaac MD      [address] [place] [address] Office [phone]

## 2020-12-27 ENCOUNTER — Other Ambulatory Visit: Payer: Self-pay | Admitting: Cardiothoracic Surgery

## 2020-12-27 ENCOUNTER — Other Ambulatory Visit: Payer: Self-pay | Admitting: Student

## 2020-12-27 ENCOUNTER — Ambulatory Visit (HOSPITAL_COMMUNITY)
Admission: RE | Admit: 2020-12-27 | Discharge: 2020-12-27 | Disposition: A | Discharge status: Home / Self Care | Payer: BC Managed Care – PPO | Source: Ambulatory Visit | Attending: Cardiothoracic Surgery | Admitting: Cardiothoracic Surgery

## 2020-12-27 ENCOUNTER — Telehealth: Payer: Self-pay | Admitting: Internal Medicine

## 2020-12-27 ENCOUNTER — Other Ambulatory Visit: Payer: Self-pay | Admitting: Physician Assistant

## 2020-12-27 ENCOUNTER — Ambulatory Visit (HOSPITAL_COMMUNITY)
Admission: RE | Admit: 2020-12-27 | Discharge: 2020-12-27 | Disposition: A | Discharge status: Home / Self Care | Payer: BC Managed Care – PPO | Source: Ambulatory Visit | Attending: Interventional Radiology | Admitting: Interventional Radiology

## 2020-12-27 ENCOUNTER — Other Ambulatory Visit: Payer: Self-pay

## 2020-12-27 DIAGNOSIS — S270XXA Traumatic pneumothorax, initial encounter: Secondary | ICD-10-CM

## 2020-12-27 DIAGNOSIS — Z4682 Encounter for fitting and adjustment of non-vascular catheter: Secondary | ICD-10-CM | POA: Diagnosis not present

## 2020-12-27 DIAGNOSIS — Z938 Other artificial opening status: Secondary | ICD-10-CM | POA: Diagnosis not present

## 2020-12-27 DIAGNOSIS — J939 Pneumothorax, unspecified: Secondary | ICD-10-CM | POA: Diagnosis not present

## 2020-12-27 DIAGNOSIS — J90 Pleural effusion, not elsewhere classified: Secondary | ICD-10-CM | POA: Diagnosis not present

## 2020-12-27 LAB — CBC
HCT: 42.5 % (ref 36.0–46.0)
Hemoglobin: 13.8 g/dL (ref 12.0–15.0)
MCH: 31.4 pg (ref 26.0–34.0)
MCHC: 32.5 g/dL (ref 30.0–36.0)
MCV: 96.6 fL (ref 80.0–100.0)
Platelets: 249 10*3/uL (ref 150–400)
RBC: 4.4 MIL/uL (ref 3.87–5.11)
RDW: 12.5 % (ref 11.5–15.5)
WBC: 5.8 10*3/uL (ref 4.0–10.5)
nRBC: 0 % (ref 0.0–0.2)

## 2020-12-27 LAB — PROTIME-INR
INR: 1 (ref 0.8–1.2)
Prothrombin Time: 12.9 seconds (ref 11.4–15.2)

## 2020-12-27 LAB — PREGNANCY, URINE: Preg Test, Ur: NEGATIVE

## 2020-12-27 LAB — SARS CORONAVIRUS 2 (TAT 6-24 HRS): SARS Coronavirus 2: NEGATIVE

## 2020-12-27 MED ORDER — MIDAZOLAM HCL 2 MG/2ML IJ SOLN
INTRAMUSCULAR | Status: AC | PRN
  Administered 2020-12-27: 1 mg via INTRAVENOUS
  Administered 2020-12-27: 0.5 mg via INTRAVENOUS

## 2020-12-27 MED ORDER — ACETAMINOPHEN 500 MG PO TABS
ORAL_TABLET | ORAL | Status: AC
  Administered 2020-12-27: 500 mg via ORAL
  Filled 2020-12-27: qty 1

## 2020-12-27 MED ORDER — TRAMADOL HCL 50 MG PO TABS: 50.0000 mg | ORAL_TABLET | Freq: Four times a day (QID) | ORAL | 0 refills | Status: DC | PRN

## 2020-12-27 MED ORDER — MIDAZOLAM HCL 2 MG/2ML IJ SOLN
INTRAMUSCULAR | Status: AC
  Filled 2020-12-27: qty 4

## 2020-12-27 MED ORDER — FENTANYL CITRATE (PF) 100 MCG/2ML IJ SOLN
INTRAMUSCULAR | Status: AC
  Filled 2020-12-27: qty 4

## 2020-12-27 MED ORDER — SODIUM CHLORIDE 0.9 % IV SOLN: INTRAVENOUS | Status: DC

## 2020-12-27 MED ORDER — ACETAMINOPHEN 325 MG PO TABS: 650.0000 mg | ORAL_TABLET | Freq: Four times a day (QID) | ORAL | Status: DC | PRN

## 2020-12-27 MED ORDER — SODIUM CHLORIDE 0.9 % IV SOLN
INTRAVENOUS | Status: AC | PRN
  Administered 2020-12-27: 10 mL/h via INTRAVENOUS

## 2020-12-27 MED ORDER — FENTANYL CITRATE (PF) 100 MCG/2ML IJ SOLN
INTRAMUSCULAR | Status: AC | PRN
  Administered 2020-12-27 (×2): 25 ug via INTRAVENOUS

## 2020-12-27 MED ORDER — ACETAMINOPHEN 500 MG PO TABS
500.0000 mg | ORAL_TABLET | Freq: Once | ORAL | Status: AC
  Filled 2020-12-27: qty 1

## 2020-12-27 NOTE — Telephone Encounter (Signed)
R/s appt per 1/13 sch msg - left message for patient with new appt date and time

## 2020-12-27 NOTE — H&P (Signed)
[address]       [place] [address]             [phone]                    Avarae Oliva-Kroll [place] Medical Record #[id number] Date of Birth: 06/22/1976  Referring: Grace Isaac, MD Primary Care: Isaac Bliss, Rayford Halsted, MD Primary Cardiologist: No primary care provider on file.  Chief Complaint:    History of Present Illness:    Leslie [name] 45 y.o. 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  Since that time she has been treated by Dr. Julien Nordmann And planned PET scan was done yesterday afternoon which demonstrated incidental 30% left pneumothorax.  In retrospect the patient had been having discomfort over the left posterior rib for 6 to 8 weeks.  But never to the point of requiring pain medication.  She continued with extensive heavy physical exercise.    Currently she is not severely symptomatic with shortness of breath.  Because of limitations of available emergency room space and admission room, I discussed with the patient having an outpatient chest tube placed and let her go home with a mini express/Heimlich valve.  She is agreeable with this approach to try to avoid spending large amounts of time in the emergency room.   A COVID test was done yesterday which is negative Patient comes in this morning for placement of left chest tube    Treatment includes:  1) left temporal craniotomy with resection of tumor with intraoperative stereotactic guidance for volumetric resection under the care of Dr. Annette Stable on 04/29/2018. 2) First-line treatment with immunotherapy with Keytruda 200 mg IV every 3 weeks.  First dose June 02, 2018.  Status post 3 cycles.  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 3) palliative radiotherapy to the left lung mass under the care of Dr. Lisbeth Renshaw completed on 08/24/2018. 4)  Resuming her treatment again with Keytruda 200 mg IV every 3 weeks, first dose 09/08/2018.  Status post 2 cycles.  CURRENT THERAPY: Treatment with immunotherapy with ipilimumab 3 mg/KG and nivolumab 1 mg/KG every 3 weeks for the first 4 cycles followed by maintenance nivolumab 240 mg IV every 2 weeks.  First dose of this treatment October 26, 2018.  Status post 37 cycles.  Ipilimumab was discontinued after cycle #2 for the significant liver dysfunction.  Starting from cycle #3 the patient is on treatment with single agent nivolumab. Starting from cycle #39 the patient is switching to nivolumab 480 mg IV every 4 weeks.    Current Activity/ Functional Status:  Patient is independent with mobility/ambulation, transfers, ADL's, IADL's.   Zubrod Score: At the time of surgery this patient's most appropriate activity status/level should be described as: _0     0    Normal activity, no symptoms _1     1    Restricted in physical strenuous activity but ambulatory, able to do out light work _2     2    Ambulatory and capable of self care, unable to do work activities, up and about               >50 % of waking hours                              _3   3    Only limited self care, in bed greater than 50% of waking hours _0     4    Completely disabled, no self care, confined to bed or chair _1     5    Moribund   Past Medical History:  Diagnosis Date  . 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 Migraines   . Yeast infection     Past Surgical History:  Procedure Laterality Date  . APPLICATION OF CRANIAL NAVIGATION Left 04/29/2018   Procedure: APPLICATION OF CRANIAL NAVIGATION;  Surgeon: Earnie Larsson, MD;  Location: [facility];  Service: Neurosurgery;  Laterality: Left;  . CRANIOTOMY Left 04/29/2018   Procedure: LEFT CRANIOTOMY FOR  TUMOR BRAIN LAB;  Surgeon: Earnie Larsson, MD;  Location: [facility];  Service: Neurosurgery;  Laterality: Left;    Family History  Problem Relation Age of  Onset  . Cancer Father        lung     Social History   Tobacco Use  Smoking Status Never Smoker  Smokeless Tobacco Never Used    Social History   Substance and Sexual Activity  Alcohol Use Yes     Allergies  Allergen Reactions  . Diflucan [Fluconazole] Other (See Comments)    Wheezing and throat pressue    Current Outpatient Medications  Medication Sig Dispense Refill  . AMERICAN GINSENG PO Take 1,500-3,000 mg by mouth daily. Depending on level of fatigue    . Amino Acids (L-CARNITINE) LIQD Take 2,000 mg by mouth daily. Liquid L-Carnitine 2000 mg    . Ascorbic Acid 500 MG/15ML LIQD Take 1,000 mg by mouth daily.    . Beta Glucan POWD 1 Dose by Does not apply route daily. Beta D Glucan 400 mg    . Calcium-Magnesium (CAL-MAG PO) Take 15 mLs by mouth daily. Marine Based Cal - Mag (500 mg Calcium / 250 mg Mag) 15 ml total    . Coenzyme Q10 (COQ-10) 100 MG CAPS Take 1 tablet by mouth daily.    Marland Kitchen CREATINE MONOHYDRATE PO Take 6 g by mouth daily. Creatine MonoHydrate 6 g (on training cycle)    . doxylamine, Sleep, (UNISOM) 25 MG tablet Take 12.5 mg by mouth at bedtime as needed for sleep.    . ferrous sulfate 324 MG TBEC Take 324 mg by mouth 2 (two) times daily.    . Glucosamine-Chondroit-Vit C-Mn (GLUCOSAMINE 1500 COMPLEX PO) Take 1 tablet by mouth daily. Vegan CMO Glucosamine    . Iron-Folic Acid-Vit N62 (IRON FORMULA PO) Take 1 Dose by mouth daily. Iron 50 mg (1/2 before workout and 1/2 afterwards - cycle 1 wk off and on)    . Lysine POWD 1,600 mg by Does not apply route daily. L-Lysine Powder 1600 mg    . Methylsulfonylmethane (MSM) 1000 MG CAPS Take 4 capsules by mouth daily. 4000 mg (1/2 before workout and 1/2 afterwards)    . Multiple Vitamins-Minerals (MY-VITALIFE) CAPS Take by mouth.    . nivolumab (OPDIVO) 40 MG/4ML SOLN chemo injection Opdivo 40 mg/4 mL intravenous solution  INFUSE 40 MG OVER 30 MINUTE(S) BY INTRAVENOUS ROUTE EVERY 2 WEEKS    . Hyaluronic Acid-Vitamin C  (HYALURONIC ACID PO) Take 100 mg by mouth daily. Liquid Hyaluronic Acid 100 mg    . Multiple Vitamin (MULTIVITAMIN) tablet Take 1 tablet by mouth daily.    . Multiple Vitamins-Minerals (ZINC PO) Take 1 tablet by mouth daily. Raw Zinc 30 mg (cycle every other week)    .  NON FORMULARY Take 1 Dose by mouth daily. Cordyceps 750 mg    . NON FORMULARY Take 1 Dose by mouth daily. Liver Herbal Supplement 675 mg    . NON FORMULARY Take 1 Dose by mouth daily. Reishi Mushroom 500 mg    . NON FORMULARY Take 1 Dose by mouth daily. Curcumin Phytosome (Bioperrine) 3000 mg    . NON FORMULARY Take 1 tablet by mouth daily. Artemisnin 200 mg    . NON FORMULARY Take 1 tablet by mouth daily. Wellness Herbal Tablet as needed (Elderberry, Ashwagandha, etc)    . NON FORMULARY Take 1 tablet by mouth daily. Zinc L Carnosine Complex 75/59 mg (after workout)    . Omega 3-6-9 Fatty Acids (OMEGA-3-6-9 PO) Take 2 capsules by mouth daily. Plant Based Omega 3-6-9 2 capsules = 1825 mg    . OVER THE COUNTER MEDICATION TK 5 MILLILITERS PO QID PRF MOUTH PAIN    . QUERCETIN PO Take 1 tablet by mouth daily. Quercetin 400 mg / Bromelain 85 mg    . RESVERATROL PO Take 1,200 mg by mouth daily. Trans Resveratrol    . SM Omega-3-6-9 Fatty Acids CAPS Take by mouth.    Marland Kitchen VITAMIN A PO Take 1 tablet by mouth daily. 1500 mcg (5000 IU)    . Vitamin D-Vitamin K (VITAMIN K2-VITAMIN D3 PO) Take 1 capsule by mouth daily. 5000 IU + 50 mcg    . vitamin E 180 MG (400 UNITS) capsule Take by mouth.    Marland Kitchen VITAMIN E PO Take 1 tablet by mouth daily. Vegan Vitamin E     Current Facility-Administered Medications  Medication Dose Route Frequency Provider Last Rate Last Admin  . 0.9 %  sodium chloride infusion   Intravenous Continuous Theresa Duty, NP        Pertinent items are noted in HPI.   Review of Systems:     Cardiac Review of Systems: [Y] = yes  or   [ N ] = no   Chest Pain [  Left rib pain one spot  ]  Resting SOB [n   ] Exertional SOB   [n  ]  Orthopnea [ n ]   Pedal Edema [  n ]    Palpitations [ n ] Syncope  [n  ]   Presyncope [  n ]   General Review of Systems: [Y] = yes [  ]=no Constitional: recent weight change [  ];  Wt loss over the last 3 months [   ] anorexia [  ]; fatigue [  ]; nausea [  ]; night sweats [  ]; fever [  ]; or chills [  ];           Eye : blurred vision [  ]; diplopia [   ]; vision changes [  ];  Amaurosis fugax[  ]; Resp: cough [  ];  wheezing[  ];  hemoptysis[  ]; shortness of breath[  ]; paroxysmal nocturnal dyspnea[  ]; dyspnea on exertion[  ]; or orthopnea[  ];  GI:  gallstones[  ], vomiting[  ];  dysphagia[  ]; melena[  ];  hematochezia [  ]; heartburn[  ];   Hx of  Colonoscopy[  ]; GU: kidney stones [  ]; hematuria[  ];   dysuria [  ];  nocturia[  ];  history of     obstruction [  ]; urinary frequency [  ]  Skin: rash, swelling[  ];, hair loss[  ];  peripheral edema[  ];  or itching[  ]; Musculosketetal: myalgias[  ];  joint swelling[  ];  joint erythema[  ];  joint pain[  ];  back pain[  ];  Heme/Lymph: bruising[  ];  bleeding[  ];  anemia[  ];  Neuro: TIA[  ];  headaches[  ];  stroke[  ];  vertigo[  ];  seizures[  ];   paresthesias[  ];  difficulty walking[  ];  Psych:depression[  ]; anxiety[  ];  Endocrine: diabetes[  ];  thyroid dysfunction[  ];  Immunizations: Flu up to date Blue.Reese  ]; Pneumococcal up to date Blue.Reese  ]; COVID-25 vacination completed  [ y  ]  Other:     PHYSICAL EXAMINATION: BP 114/73   Pulse 63   Temp 97.7 F (36.5 C) (Oral)   Resp 14   Ht _0  (1.753 m)   Wt 66.7 kg   LMP 12/01/2020   SpO2 100%   BMI 21.71 kg/m  General appearance: alert and cooperative Neck: no adenopathy, no carotid bruit, no JVD, supple, symmetrical, trachea midline and thyroid not enlarged, symmetric, no tenderness/mass/nodules Lymph nodes: Cervical, supraclavicular, and axillary nodes normal. Resp: diminished breath sounds LUL Cardio: regular rate and rhythm, S1, S2 normal, no  murmur, click, rub or gallop Extremities: extremities normal, atraumatic, no cyanosis or edema Neurologic: Grossly normal  Diagnostic Studies & Laboratory data:     Recent Radiology Findings:   NM PET Image Restage (PS) Whole Body  Result Date: 12/26/2020 CLINICAL DATA:  Subsequent treatment strategy for melanoma. EXAM: NUCLEAR MEDICINE PET WHOLE BODY TECHNIQUE: 7.4 mCi F-18 FDG was injected intravenously. Full-ring PET imaging was performed from the head to foot after the radiotracer. CT data was obtained and used for attenuation correction and anatomic localization. Fasting blood glucose: 84 mg/dl COMPARISON:  Chest abdomen pelvis CT 01/05/2020.  PET-CT 10/20/2019. FINDINGS: Mediastinal blood pool activity: SUV max 1.9 HEAD/NECK: No hypermetabolic activity in the scalp. No hypermetabolic cervical lymph nodes. Incidental CT findings: none CHEST: No hypermetabolic mediastinal or hilar nodes. No suspicious pulmonary nodules on the CT scan. Incidental CT findings: Moderate to large left pneumothorax with associated left pleural effusion. Known lesion in the lingula with presumed adjacent post treatment scarring is distorted secondary to the presence of the pneumothorax but is grossly similar to the prior exam. ABDOMEN/PELVIS: No abnormal hypermetabolic activity within the liver, pancreas, adrenal glands, or spleen. No hypermetabolic lymph nodes in the abdomen or pelvis. Incidental CT findings: Trace free fluid noted in the pelvis, likely physiologic. SKELETON: Focal hypermetabolism identified lateral aspect of the left eighth rib, corresponding to a fracture site. There is some lucency at the fracture which could be related to bony resorption secondary to nonacute injury. Pathologic fracture considered less likely but not excluded. Incidental CT findings: none EXTREMITIES: No abnormal hypermetabolic activity in the lower extremities. Incidental CT findings: none IMPRESSION: 1. Interval development of a  moderate to large left-sided pneumothorax without cardiomediastinal shift. Associated small left pleural effusion. There is a focus of low level hypermetabolism associated with a fracture of the lateral left eighth rib and correlation for recent fall/trauma recommended. Lucency at the fracture site may be related to some resorption secondary to nonacute injury although pathologic fracture not excluded. 2. The soft tissue lesion in the lingula appears grossly stable but is distorted due to the left lung collapse on today's exam. No hypermetabolism in this lesion on today's study. No  other additional sites of new or suspicious hypermetabolism on today's exam. Critical Value/emergent results were called by telephone at the time of interpretation on 12/26/2020 at 10:42 am to provider [facility] , who verbally acknowledged these results. Electronically Signed   By: Misty Stanley M.D.   On: 12/26/2020 10:42     I have independently reviewed the above radiology studies  and reviewed the findings with the patient.   Recent Lab Findings: Lab Results  Component Value Date   WBC 3.7 (L) 12/02/2020   HGB 13.7 12/02/2020   HCT 40.6 12/02/2020   PLT 219 12/02/2020   GLUCOSE 69 (L) 12/02/2020   CHOL 159 11/22/2020   TRIG 67.0 11/22/2020   HDL 59.90 11/22/2020   LDLCALC 86 11/22/2020   ALT 27 12/02/2020   AST 34 12/02/2020   NA 139 12/02/2020   K 3.9 12/02/2020   CL 107 12/02/2020   CREATININE 0.96 12/02/2020   BUN 14 12/02/2020   CO2 23 12/02/2020   TSH 2.330 12/02/2020   INR 0.98 04/27/2018   HGBA1C 5.0 11/22/2020      Assessment / Plan:   #1 history of metastatic melanoma currently undergoing treatment-previously presented involving the left lung and brain #2 incidental finding of spontaneous left pneumothorax on on follow-up PET scan-also likely pathologic fracture left eighth rib  I discussed with the patient in detail proceeding with outpatient placement of left pleural drainage  catheter, and manage this as an outpatient, extensive directions have been given to the patient about care at home.  Will have follow-up chest x-ray on Tuesday along with left rib details.  We will plan removal of chest tube when there is no air leak and lung is re inflated.  If we determined that the rib fracture is pathologic she may require palliative radiotherapy to the left eighth rib for pain control      Grace Isaac MD      [address] [place] [address] Office [phone]     12/27/2020 11:44 AM

## 2020-12-27 NOTE — Procedures (Signed)
Pre procedural Dx: Left sided PTX Post procedural Dx: Same  Technically successful CT guided placed of a 10 Fr drainage catheter placement into the apical aspect of the left pleural spac resulting in near complete expansion of the left lung.  EBL: Trace Complications: None immediate  Ronny Bacon, MD Pager #: [phone]

## 2020-12-27 NOTE — Discharge Instructions (Signed)
Moderate Conscious Sedation, Adult, Care After This sheet gives you information about how to care for yourself after your procedure. Your health care provider may also give you more specific instructions. If you have problems or questions, contact your health care provider. What can I expect after the procedure? After the procedure, it is common to have:  Sleepiness for several hours.  Impaired judgment for several hours.  Difficulty with balance.  Vomiting if you eat too soon. Follow these instructions at home: For the time period you were told by your health care provider:  Rest.  Do not participate in activities where you could fall or become injured.  Do not drive or use machinery.  Do not drink alcohol.  Do not take sleeping pills or medicines that cause drowsiness.  Do not make important decisions or sign legal documents.  Do not take care of children on your own.      Eating and drinking  Follow the diet recommended by your health care provider.  Drink enough fluid to keep your urine pale yellow.  If you vomit: ? Drink water, juice, or soup when you can drink without vomiting. ? Make sure you have little or no nausea before eating solid foods.   General instructions  Take over-the-counter and prescription medicines only as told by your health care provider.  Have a responsible adult stay with you for the time you are told. It is important to have someone help care for you until you are awake and alert.  Do not smoke.  Keep all follow-up visits as told by your health care provider. This is important. Contact a health care provider if:  You are still sleepy or having trouble with balance after 24 hours.  You feel light-headed.  You keep feeling nauseous or you keep vomiting.  You develop a rash.  You have a fever.  You have redness or swelling around the IV site. Get help right away if:  You have trouble breathing.  You have new-onset confusion at  home. Summary  After the procedure, it is common to feel sleepy, have impaired judgment, or feel nauseous if you eat too soon.  Rest after you get home. Know the things you should not do after the procedure.  Follow the diet recommended by your health care provider and drink enough fluid to keep your urine pale yellow.  Get help right away if you have trouble breathing or new-onset confusion at home. This information is not intended to replace advice given to you by your health care provider. Make sure you discuss any questions you have with your health care provider. Document Revised: 03/29/2020 Document Reviewed: 10/26/2019 Elsevier Patient Education  2021 [employer].

## 2020-12-27 NOTE — H&P (Addendum)
Chief Complaint: Patient was seen in consultation today for left apical anterior pneumothorax  Referring Physician(s): Grace Isaac  Supervising Physician: Jacqulynn Cadet  Patient Status: [facility] - Out-pt  History of Present Illness: Leslie [name] is a 45 y.o. female with a medical history significant for migraines and melanoma with brain and lung metastases, diagnosed 2019. She is status post left temporal craniotomy with tumor resection, immunotherapy and radiotherapy. She is currently on maintenance immunotherapy. She underwent a restaging PET on 12/26/20 and was discovered to have a moderate to large left pneumothorax with associated left pleural effusion.   PET 12/27/20 IMPRESSION: 1. Interval development of a moderate to large left-sided pneumothorax without cardiomediastinal shift. Associated small left pleural effusion. There is a focus of low level hypermetabolism associated with a fracture of the lateral left eighth rib and correlation for recent fall/trauma recommended. Lucency at the fracture site may be related to some resorption secondary to nonacute injury although pathologic fracture not excluded. 2. The soft tissue lesion in the lingula appears grossly stable but is distorted due to the left lung collapse on today's exam. No hypermetabolism in this lesion on today's study. No other additional sites of new or suspicious hypermetabolism on today's exam.  Dr. Julien Nordmann consulted with Dr. Servando Snare for further management, and Dr. Servando Snare consulted IR.    Interventional Radiology has been asked to evaluate this patient for an image-guided left chest tube placement for further treatment and management of a left pneumothorax. This case has been reviewed and procedure approved by Dr. Vernard Gambles.    Past Medical History:  Diagnosis Date  . 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 Migraines   . Yeast infection     Past Surgical History:   Procedure Laterality Date  . APPLICATION OF CRANIAL NAVIGATION Left 04/29/2018   Procedure: APPLICATION OF CRANIAL NAVIGATION;  Surgeon: Earnie Larsson, MD;  Location: [place];  Service: Neurosurgery;  Laterality: Left;  . CRANIOTOMY Left 04/29/2018   Procedure: LEFT CRANIOTOMY FOR  TUMOR BRAIN LAB;  Surgeon: Earnie Larsson, MD;  Location: [place];  Service: Neurosurgery;  Laterality: Left;    Allergies: Diflucan [fluconazole]  Medications: Prior to Admission medications   Medication Sig Start Date End Date Taking? Authorizing Provider  AMERICAN GINSENG PO Take 1,500-3,000 mg by mouth daily. Depending on level of fatigue    [provider]  Amino Acids (L-CARNITINE) LIQD Take 2,000 mg by mouth daily. Liquid L-Carnitine 2000 mg    [provider]  Ascorbic Acid 500 MG/15ML LIQD Take 1,000 mg by mouth daily.    [provider]  Beta Glucan POWD 1 Dose by Does not apply route daily. Beta D Glucan 400 mg    [provider]  Calcium-Magnesium (CAL-MAG PO) Take 15 mLs by mouth daily. Marine Based Cal - Mag (500 mg Calcium / 250 mg Mag) 15 ml total    [provider]  Coenzyme Q10 (COQ-10) 100 MG CAPS Take 1 tablet by mouth daily.    [provider]  CREATINE MONOHYDRATE PO Take 6 g by mouth daily. Creatine MonoHydrate 6 g (on training cycle)    [provider]  doxylamine, Sleep, (UNISOM) 25 MG tablet Take 12.5 mg by mouth at bedtime as needed for sleep.    [provider]  ferrous sulfate 324 MG TBEC Take 324 mg by mouth 2 (two) times daily.    [provider]  Glucosamine-Chondroit-Vit C-Mn (GLUCOSAMINE 1500 COMPLEX PO) Take  1 tablet by mouth daily. Vegan CMO Glucosamine    [provider]  Hyaluronic Acid-Vitamin C (HYALURONIC ACID PO) Take 100 mg by mouth daily. Liquid Hyaluronic Acid 100 mg    [provider]  Iron-Folic Acid-Vit X32 (IRON FORMULA PO) Take 1 Dose by mouth daily. Iron 50 mg (1/2 before  workout and 1/2 afterwards - cycle 1 wk off and on)    [provider]  Lysine POWD 1,600 mg by Does not apply route daily. L-Lysine Powder 1600 mg    [provider]  Methylsulfonylmethane (MSM) 1000 MG CAPS Take 4 capsules by mouth daily. 4000 mg (1/2 before workout and 1/2 afterwards)    [provider]  Multiple Vitamin (MULTIVITAMIN) tablet Take 1 tablet by mouth daily.    [provider]  Multiple Vitamins-Minerals (MY-VITALIFE) CAPS Take by mouth.    [provider]  Multiple Vitamins-Minerals (ZINC PO) Take 1 tablet by mouth daily. Raw Zinc 30 mg (cycle every other week)    [provider]  nivolumab (OPDIVO) 40 MG/4ML SOLN chemo injection Opdivo 40 mg/4 mL intravenous solution  INFUSE 40 MG OVER 30 MINUTE(S) BY INTRAVENOUS ROUTE EVERY 2 WEEKS    [provider]  NON FORMULARY Take 1 Dose by mouth daily. Cordyceps 750 mg    [provider]  NON FORMULARY Take 1 Dose by mouth daily. Liver Herbal Supplement 675 mg    [provider]  NON FORMULARY Take 1 Dose by mouth daily. Reishi Mushroom 500 mg    [provider]  NON FORMULARY Take 1 Dose by mouth daily. Curcumin Phytosome (Bioperrine) 3000 mg    [provider]  NON FORMULARY Take 1 tablet by mouth daily. Artemisnin 200 mg    [provider]  NON FORMULARY Take 1 tablet by mouth daily. Wellness Herbal Tablet as needed (Elderberry, Ashwagandha, etc)    [provider]  NON FORMULARY Take 1 tablet by mouth daily. Zinc L Carnosine Complex 75/59 mg (after workout)    [provider]  Omega 3-6-9 Fatty Acids (OMEGA-3-6-9 PO) Take 2 capsules by mouth daily. Plant Based Omega 3-6-9 2 capsules = 1825 mg    [provider]  OVER THE COUNTER MEDICATION TK 5 MILLILITERS PO QID PRF MOUTH PAIN 04/30/19   [provider]  QUERCETIN PO Take 1 tablet by mouth daily. Quercetin 400 mg / Bromelain 85 mg    [provider]  RESVERATROL PO Take 1,200 mg by mouth daily. Trans Resveratrol    [provider]  SM Omega-3-6-9 Fatty Acids CAPS Take by mouth.    [provider]  VITAMIN A PO Take 1 tablet by mouth daily. 1500 mcg (5000 IU)    [provider]  Vitamin D-Vitamin K (VITAMIN K2-VITAMIN D3 PO) Take 1 capsule by mouth daily. 5000 IU + 50 mcg    [provider]  vitamin E 180 MG (400 UNITS) capsule Take by mouth.    [provider]  VITAMIN E PO Take 1 tablet by mouth daily. Vegan Vitamin E    [provider]     Family History  Problem Relation Age of Onset  . Cancer Father        lung    Social History   Socioeconomic History  . Marital status: Divorced    Spouse name: Not on file  . Number of children: Not on file  . Years of education: Not on file  . Highest education  level: Not on file  Occupational History  . Not on file  Tobacco Use  . Smoking status: Never Smoker  . Smokeless tobacco: Never Used  Vaping Use  . Vaping Use: Never used  Substance and Sexual Activity  . Alcohol use: Yes  . Drug use: No  . Sexual activity: Yes    Birth control/protection: Condom  Other Topics Concern  . Not on file  Social History Narrative  . Not on file   Social Determinants of Health   Financial Resource Strain: Not on file  Food Insecurity: Not on file  Transportation Needs: Not on file  Physical Activity: Not on file  Stress: Not on file  Social Connections: Not on file    Review of Systems: A 12 point ROS discussed and pertinent positives are indicated in the HPI above.  All other systems are negative.  Review of Systems  Constitutional: Negative for appetite change and fatigue.  Respiratory: Positive for chest tightness. Negative for cough and shortness of breath.   Cardiovascular: Negative for chest pain and leg swelling.  Gastrointestinal: Negative for diarrhea, nausea and vomiting.  Musculoskeletal: Negative for  back pain.  Neurological: Negative for headaches.    Vital Signs: BP 114/73   Pulse 63   Temp 97.7 F (36.5 C) (Oral)   Resp 14   Ht _0  (1.753 m)   Wt 147 lb (66.7 kg)   LMP 12/01/2020   SpO2 100%   BMI 21.71 kg/m   Physical Exam Constitutional:      General: She is not in acute distress.    Appearance: She is not ill-appearing.  HENT:     Mouth/Throat:     Mouth: Mucous membranes are moist.     Pharynx: Oropharynx is clear.  Cardiovascular:     Rate and Rhythm: Normal rate and regular rhythm.     Pulses: Normal pulses.     Heart sounds: Normal heart sounds.  Pulmonary:     Comments: Breath sounds normal on the right. Slightly diminished breath sound on the left with fine crackles in the left lower lung field.  Abdominal:     General: Bowel sounds are normal.     Palpations: Abdomen is soft.  Musculoskeletal:        General: Normal range of motion.  Skin:    General: Skin is warm and dry.  Neurological:     Mental Status: She is alert and oriented to person, place, and time.     Imaging: NM PET Image Restage (PS) Whole Body  Result Date: 12/26/2020 CLINICAL DATA:  Subsequent treatment strategy for melanoma. EXAM: NUCLEAR MEDICINE PET WHOLE BODY TECHNIQUE: 7.4 mCi F-18 FDG was injected intravenously. Full-ring PET imaging was performed from the head to foot after the radiotracer. CT data was obtained and used for attenuation correction and anatomic localization. Fasting blood glucose: 84 mg/dl COMPARISON:  Chest abdomen pelvis CT 01/05/2020.  PET-CT 10/20/2019. FINDINGS: Mediastinal blood pool activity: SUV max 1.9 HEAD/NECK: No hypermetabolic activity in the scalp. No hypermetabolic cervical lymph nodes. Incidental CT findings: none CHEST: No hypermetabolic mediastinal or hilar nodes. No suspicious pulmonary nodules on the CT scan. Incidental CT findings: Moderate to large left pneumothorax with associated left pleural effusion. Known lesion in the lingula with  presumed adjacent post treatment scarring is distorted secondary to the presence of the pneumothorax but is grossly similar to the prior exam. ABDOMEN/PELVIS: No abnormal hypermetabolic activity within the liver, pancreas, adrenal glands, or spleen. No hypermetabolic lymph nodes  in the abdomen or pelvis. Incidental CT findings: Trace free fluid noted in the pelvis, likely physiologic. SKELETON: Focal hypermetabolism identified lateral aspect of the left eighth rib, corresponding to a fracture site. There is some lucency at the fracture which could be related to bony resorption secondary to nonacute injury. Pathologic fracture considered less likely but not excluded. Incidental CT findings: none EXTREMITIES: No abnormal hypermetabolic activity in the lower extremities. Incidental CT findings: none IMPRESSION: 1. Interval development of a moderate to large left-sided pneumothorax without cardiomediastinal shift. Associated small left pleural effusion. There is a focus of low level hypermetabolism associated with a fracture of the lateral left eighth rib and correlation for recent fall/trauma recommended. Lucency at the fracture site may be related to some resorption secondary to nonacute injury although pathologic fracture not excluded. 2. The soft tissue lesion in the lingula appears grossly stable but is distorted due to the left lung collapse on today's exam. No hypermetabolism in this lesion on today's study. No other additional sites of new or suspicious hypermetabolism on today's exam. Critical Value/emergent results were called by telephone at the time of interpretation on 12/26/2020 at 10:42 am to provider [facility] , who verbally acknowledged these results. Electronically Signed   By: Misty Stanley M.D.   On: 12/26/2020 10:42    Labs:  CBC: Recent Labs    11/04/20 1127 11/22/20 0800 12/02/20 0910 12/27/20 1152  WBC 6.2 3.1* 3.7* 5.8  HGB 14.6 14.4 13.7 13.8  HCT 42.4 43.2 40.6 42.5   PLT 229 267.0 219 249    COAGS: No results for input(s): INR, APTT in the last 8760 hours.  BMP: Recent Labs    07/30/20 1100 08/13/20 1011 08/27/20 0809 09/10/20 0825 09/23/20 0959 10/08/20 1337 11/04/20 1127 11/22/20 0800 12/02/20 0910  NA 140 136 138 140 140 141 138 140 139  K 4.5 4.7 4.5 4.3 4.2 4.0 4.3 5.8* 3.9  CL 108 105 104 111 106 107 108 106 107  CO_0  GLUCOSE 79 85 67* 76 77 94 76 80 69*  BUN _1 CALCIUM 9.9 9.9 9.1 9.2 9.4 9.5 9.2 9.7 9.1  CREATININE 1.01* 0.98 1.01* 0.98 1.03* 1.02* 0.97 1.02 0.96  GFRNONAA >60 >60 >60 >60 >60 >60 >60  --  >60  GFRAA >60 >60 >60 >60  --   --   --   --   --     LIVER FUNCTION TESTS: Recent Labs    10/08/20 1337 11/04/20 1127 11/22/20 0800 12/02/20 0910  BILITOT 0.4 0.3 0.6 0.4  AST 35 31 27 34  ALT _2 ALKPHOS 79 76 61 74  PROT 7.0 7.1 6.8 7.0  ALBUMIN 4.4 4.3 4.3 4.1    TUMOR MARKERS: No results for input(s): AFPTM, CEA, CA199, CHROMGRNA in the last 8760 hours.  Assessment and Plan:  History of melanoma with brain/lung metastases; left pneumothorax/pleural effusion identified on restaging PET: Leslie [name], 45 year old female, presents today to the [facility] department for an image-guided left chest tube at the request of Dr. Servando Snare. Per Dr. Everrett Coombe telephone note 12/26/20, the patient was provided information about the procedure/chest tube and has plans for outpatient follow up Tuesday, December 31, 2020. Additional information was obtained from Dr. Servando Snare via [place] today and he stated the patient is very educated and can manage the tube with just office follow up.  Per Dr. Everrett Coombe instruction, the patient will be connected to an [facility] mini-express with a heimlich valve, which he has provided, and is currently in its sterile packaging at the patient's bedside in Short Stay. This procedure will be performed under  moderate sedation and the patient will be discharged home after an appropriate recovery period.   The patient has never had a chest tube before but states she feels comfortable going home with the tube and following up with Dr. Servando Snare on Tuesday. The patient knows that if she has any questions about the chest tube or requires any assistance with the tube she will need to contact Dr. Everrett Coombe office or go to the ED.    Risks and benefits discussed with the patient including bleeding, infection, damage to adjacent structures, and sepsis.  All of the patient's questions were answered, patient is agreeable to proceed.  The patient has been NPO. Labs and vitals have been reviewed.   Consent signed and in chart.   Thank you for this interesting consult.  I greatly enjoyed meeting Leslie [name] look forward to participating in their care.  A copy of this report was sent to the requesting provider on this date.  Electronically Signed: Soyla Dryer, AGACNP-BC [phone] 12/27/2020, 12:10 PM   I spent a total of  60 Minutes   in face to face in clinical consultation, greater than 50% of which was counseling/coordinating care for left chest tube placement.

## 2020-12-30 ENCOUNTER — Inpatient Hospital Stay: Payer: BC Managed Care – PPO

## 2020-12-30 ENCOUNTER — Other Ambulatory Visit: Payer: Self-pay | Admitting: Cardiothoracic Surgery

## 2020-12-30 ENCOUNTER — Inpatient Hospital Stay: Payer: BC Managed Care – PPO | Admitting: Physician Assistant

## 2020-12-30 DIAGNOSIS — S270XXS Traumatic pneumothorax, sequela: Secondary | ICD-10-CM

## 2020-12-31 ENCOUNTER — Ambulatory Visit
Admission: RE | Admit: 2020-12-31 | Discharge: 2020-12-31 | Disposition: A | Discharge status: Home / Self Care | Payer: BC Managed Care – PPO | Source: Ambulatory Visit | Attending: Physician Assistant | Admitting: Physician Assistant

## 2020-12-31 ENCOUNTER — Encounter: Payer: Self-pay | Admitting: Physician Assistant

## 2020-12-31 ENCOUNTER — Ambulatory Visit (INDEPENDENT_AMBULATORY_CARE_PROVIDER_SITE_OTHER): Payer: BC Managed Care – PPO | Admitting: Physician Assistant

## 2020-12-31 ENCOUNTER — Other Ambulatory Visit: Payer: Self-pay

## 2020-12-31 ENCOUNTER — Ambulatory Visit
Admission: RE | Admit: 2020-12-31 | Discharge: 2020-12-31 | Disposition: A | Discharge status: Home / Self Care | Payer: BC Managed Care – PPO | Source: Ambulatory Visit | Attending: Cardiothoracic Surgery | Admitting: Cardiothoracic Surgery

## 2020-12-31 ENCOUNTER — Other Ambulatory Visit: Payer: Self-pay | Admitting: Physician Assistant

## 2020-12-31 VITALS — BP 139/85 | HR 73 | Temp 97.6°F | Resp 20 | Ht 69.0 in | Wt 146.0 lb

## 2020-12-31 DIAGNOSIS — S270XXS Traumatic pneumothorax, sequela: Secondary | ICD-10-CM

## 2020-12-31 DIAGNOSIS — J984 Other disorders of lung: Secondary | ICD-10-CM | POA: Diagnosis not present

## 2020-12-31 DIAGNOSIS — J9811 Atelectasis: Secondary | ICD-10-CM | POA: Diagnosis not present

## 2020-12-31 DIAGNOSIS — Z4682 Encounter for fitting and adjustment of non-vascular catheter: Secondary | ICD-10-CM | POA: Diagnosis not present

## 2020-12-31 DIAGNOSIS — J95811 Postprocedural pneumothorax: Secondary | ICD-10-CM

## 2020-12-31 DIAGNOSIS — J939 Pneumothorax, unspecified: Secondary | ICD-10-CM | POA: Diagnosis not present

## 2020-12-31 NOTE — Patient Instructions (Signed)
No strenuous activity for the next 1-2 weeks. Patient may removed dressing and shower 01/01/2021.

## 2020-12-31 NOTE — Progress Notes (Signed)
HPI: Patient presents today for follow up of left spontaneous pneumothorax. According to medical record,  Patient is well known from previous visits when she presented given the with metastatic acute melanoma to the lung and brain in June 2019.  Since that time, she has been treated by Dr. Curt Bears. Planned PET scan was done 12/26/2020 which demonstrated an incidental 30% left pneumothorax.  In retrospect, the patient had been having discomfort over the left posterior rib for 6 to 8 weeks, but never to the point of requiring pain medication.  She continued with extensive heavy physical exercise. After discussion with the patient, it was decided that IR would place a left pigtail chest tube. Chest tube was placed to a mini express. She has had some discomfort around scapula and left anterior shoulder (likely related to chest tube) but denies shortness of breath.   Current Outpatient Medications  Medication Sig Dispense Refill  . AMERICAN GINSENG PO Take 1,500-3,000 mg by mouth daily. Depending on level of fatigue    . Amino Acids (L-CARNITINE) LIQD Take 2,000 mg by mouth daily. Liquid L-Carnitine 2000 mg    . Ascorbic Acid 500 MG/15ML LIQD Take 1,000 mg by mouth daily.    . Beta Glucan POWD 1 Dose by Does not apply route daily. Beta D Glucan 400 mg    . Calcium-Magnesium (CAL-MAG PO) Take 15 mLs by mouth daily. Marine Based Cal - Mag (500 mg Calcium / 250 mg Mag) 15 ml total    . Coenzyme Q10 (COQ-10) 100 MG CAPS Take 1 tablet by mouth daily.    Marland Kitchen CREATINE MONOHYDRATE PO Take 6 g by mouth daily. Creatine MonoHydrate 6 g (on training cycle)    . doxylamine, Sleep, (UNISOM) 25 MG tablet Take 12.5 mg by mouth at bedtime as needed for sleep.    . ferrous sulfate 324 MG TBEC Take 324 mg by mouth 2 (two) times daily.    . Glucosamine-Chondroit-Vit C-Mn (GLUCOSAMINE 1500 COMPLEX PO) Take 1 tablet by mouth daily. Vegan CMO Glucosamine    . Hyaluronic Acid-Vitamin C (HYALURONIC ACID PO) Take 100 mg  by mouth daily. Liquid Hyaluronic Acid 100 mg    . Iron-Folic Acid-Vit B28 (IRON FORMULA PO) Take 1 Dose by mouth daily. Iron 50 mg (1/2 before workout and 1/2 afterwards - cycle 1 wk off and on)    . Lysine POWD 1,600 mg by Does not apply route daily. L-Lysine Powder 1600 mg    . Methylsulfonylmethane (MSM) 1000 MG CAPS Take 4 capsules by mouth daily. 4000 mg (1/2 before workout and 1/2 afterwards)    . Multiple Vitamin (MULTIVITAMIN) tablet Take 1 tablet by mouth daily.    . Multiple Vitamins-Minerals (MY-VITALIFE) CAPS Take by mouth.    . Multiple Vitamins-Minerals (ZINC PO) Take 1 tablet by mouth daily. Raw Zinc 30 mg (cycle every other week)    . nivolumab (OPDIVO) 40 MG/4ML SOLN chemo injection Opdivo 40 mg/4 mL intravenous solution  INFUSE 40 MG OVER 30 MINUTE(S) BY INTRAVENOUS ROUTE EVERY 2 WEEKS    . NON FORMULARY Take 1 Dose by mouth daily. Cordyceps 750 mg    . NON FORMULARY Take 1 Dose by mouth daily. Liver Herbal Supplement 675 mg    . NON FORMULARY Take 1 Dose by mouth daily. Reishi Mushroom 500 mg    . NON FORMULARY Take 1 Dose by mouth daily. Curcumin Phytosome (Bioperrine) 3000 mg    . NON FORMULARY Take 1 tablet by mouth daily. Artemisnin 200 mg    .  NON FORMULARY Take 1 tablet by mouth daily. Wellness Herbal Tablet as needed (Elderberry, Ashwagandha, etc)    . NON FORMULARY Take 1 tablet by mouth daily. Zinc L Carnosine Complex 75/59 mg (after workout)    . Omega 3-6-9 Fatty Acids (OMEGA-3-6-9 PO) Take 2 capsules by mouth daily. Plant Based Omega 3-6-9 2 capsules = 1825 mg    . OVER THE COUNTER MEDICATION TK 5 MILLILITERS PO QID PRF MOUTH PAIN    . QUERCETIN PO Take 1 tablet by mouth daily. Quercetin 400 mg / Bromelain 85 mg    . RESVERATROL PO Take 1,200 mg by mouth daily. Trans Resveratrol    . SM Omega-3-6-9 Fatty Acids CAPS Take by mouth.    . traMADol (ULTRAM) 50 MG tablet Take 1 tablet (50 mg total) by mouth every 6 (six) hours as needed. 30 tablet 0  . VITAMIN A PO  Take 1 tablet by mouth daily. 1500 mcg (5000 IU)    . Vitamin D-Vitamin K (VITAMIN K2-VITAMIN D3 PO) Take 1 capsule by mouth daily. 5000 IU + 50 mcg    . vitamin E 180 MG (400 UNITS) capsule Take by mouth.    Marland Kitchen VITAMIN E PO Take 1 tablet by mouth daily. Vegan Vitamin E     Current Facility-Administered Medications  Medication Dose Route Frequency Provider Last Rate Last Admin  . acetaminophen (TYLENOL) tablet 650 mg  650 mg Oral Q6H PRN Harriet Pho, Tessa N, PA-C      Vital Signs: Temp 97.6, RR 20, HR 73, and BP 139/85  Physical Exam: CV-RRR Pulmonary-Clear to auscultation bilaterally Left anterior chest tube wound-Clean and dry  Diagnostic Tests: CLINICAL DATA:  Pneumothorax.  EXAM: CHEST - 2 VIEW  COMPARISON:  12/27/2020  FINDINGS: Left pleural drain remains in place. Tiny left apical pneumothorax seen previously is no longer evident. Atelectasis/scarring noted left base. Right lung clear. The cardiopericardial silhouette is within normal limits for size.  IMPRESSION: Left pleural drain remains in place with resolution of tiny left apical pneumothorax.   Electronically Signed   By: Misty Stanley M.D.   On: 12/31/2020 13:18  Impression and Plan: Chest x ray shows resolution of previously seen left spontaneous pneumothorax.  There was not much drainage from chest tube. Dr. Kipp Brood evaluated mini express for an air leak and there was no visible air leak. I removed tape and securing device. I cut chest tube suture and removed the pigtail chest tube. Patient tolerated the procedure well. She returned to [place] for same day repeat PA/LAT CXR. Again, there was no pneumothorax. Patient inquired about showering, which I told her would be fine in the am. She is to remove the dressing prior to showering. She was instructed to avoid strenuous activity for the next 2 weeks. She also asked about "working out". I told her no weights or core training for at least 2 weeks. She would like  to do cardio and again, I instructed her to start off slow and gradually increase her time and distance over the next 2 weeks. She will return PRN     Nani Skillern, PA-C [facility] [phone]

## 2020-12-31 NOTE — Progress Notes (Signed)
cxr 

## 2021-01-02 ENCOUNTER — Inpatient Hospital Stay: Payer: BC Managed Care – PPO | Attending: Physician Assistant

## 2021-01-02 ENCOUNTER — Other Ambulatory Visit: Payer: Self-pay

## 2021-01-02 ENCOUNTER — Inpatient Hospital Stay (HOSPITAL_BASED_OUTPATIENT_CLINIC_OR_DEPARTMENT_OTHER): Payer: BC Managed Care – PPO | Admitting: Internal Medicine

## 2021-01-02 ENCOUNTER — Encounter: Payer: Self-pay | Admitting: Internal Medicine

## 2021-01-02 ENCOUNTER — Inpatient Hospital Stay: Payer: BC Managed Care – PPO

## 2021-01-02 VITALS — BP 118/80 | HR 76 | Temp 97.6°F | Resp 18 | Ht 69.0 in | Wt 149.7 lb

## 2021-01-02 DIAGNOSIS — C7802 Secondary malignant neoplasm of left lung: Secondary | ICD-10-CM | POA: Diagnosis not present

## 2021-01-02 DIAGNOSIS — Z79899 Other long term (current) drug therapy: Secondary | ICD-10-CM | POA: Insufficient documentation

## 2021-01-02 DIAGNOSIS — C3432 Malignant neoplasm of lower lobe, left bronchus or lung: Secondary | ICD-10-CM

## 2021-01-02 DIAGNOSIS — C439 Malignant melanoma of skin, unspecified: Secondary | ICD-10-CM | POA: Insufficient documentation

## 2021-01-02 DIAGNOSIS — C7931 Secondary malignant neoplasm of brain: Secondary | ICD-10-CM | POA: Insufficient documentation

## 2021-01-02 DIAGNOSIS — Z923 Personal history of irradiation: Secondary | ICD-10-CM | POA: Diagnosis not present

## 2021-01-02 DIAGNOSIS — Z9221 Personal history of antineoplastic chemotherapy: Secondary | ICD-10-CM | POA: Insufficient documentation

## 2021-01-02 DIAGNOSIS — S2242XA Multiple fractures of ribs, left side, initial encounter for closed fracture: Secondary | ICD-10-CM

## 2021-01-02 DIAGNOSIS — Z5112 Encounter for antineoplastic immunotherapy: Secondary | ICD-10-CM | POA: Diagnosis not present

## 2021-01-02 DIAGNOSIS — S270XXA Traumatic pneumothorax, initial encounter: Secondary | ICD-10-CM

## 2021-01-02 LAB — CMP (CANCER CENTER ONLY)
ALT: 18 U/L (ref 0–44)
AST: 24 U/L (ref 15–41)
Albumin: 4 g/dL (ref 3.5–5.0)
Alkaline Phosphatase: 78 U/L (ref 38–126)
Anion gap: 9 (ref 5–15)
BUN: 20 mg/dL (ref 6–20)
CO2: 22 mmol/L (ref 22–32)
Calcium: 9.1 mg/dL (ref 8.9–10.3)
Chloride: 107 mmol/L (ref 98–111)
Creatinine: 0.86 mg/dL (ref 0.44–1.00)
GFR, Estimated: >60 mL/min (ref >60)
Glucose, Bld: 87 mg/dL (ref 70–99)
Potassium: 4.3 mmol/L (ref 3.5–5.1)
Sodium: 138 mmol/L (ref 135–145)
Total Bilirubin: 0.3 mg/dL (ref 0.3–1.2)
Total Protein: 6.9 g/dL (ref 6.5–8.1)

## 2021-01-02 LAB — CBC WITH DIFFERENTIAL (CANCER CENTER ONLY)
Abs Immature Granulocytes: 0.01 10*3/uL (ref 0.00–0.07)
Basophils Absolute: 0.1 10*3/uL (ref 0.0–0.1)
Basophils Relative: 1 %
Eosinophils Absolute: 0.2 10*3/uL (ref 0.0–0.5)
Eosinophils Relative: 3 %
HCT: 41.4 % (ref 36.0–46.0)
Hemoglobin: 14 g/dL (ref 12.0–15.0)
Immature Granulocytes: 0 %
Lymphocytes Relative: 13 %
Lymphs Abs: 0.8 10*3/uL (ref 0.7–4.0)
MCH: 31.7 pg (ref 26.0–34.0)
MCHC: 33.8 g/dL (ref 30.0–36.0)
MCV: 93.7 fL (ref 80.0–100.0)
Monocytes Absolute: 0.6 10*3/uL (ref 0.1–1.0)
Monocytes Relative: 10 %
Neutro Abs: 4.5 10*3/uL (ref 1.7–7.7)
Neutrophils Relative %: 73 %
Platelet Count: 223 10*3/uL (ref 150–400)
RBC: 4.42 MIL/uL (ref 3.87–5.11)
RDW: 12.5 % (ref 11.5–15.5)
WBC Count: 6.2 10*3/uL (ref 4.0–10.5)
nRBC: 0 % (ref 0.0–0.2)

## 2021-01-02 LAB — TSH: TSH: 2.217 u[IU]/mL (ref 0.308–3.960)

## 2021-01-02 NOTE — Progress Notes (Signed)
[place] [phone]   [phone]  OFFICE PROGRESS NOTE  Isaac Bliss, Rayford Halsted, MD [place] [address]  DIAGNOSIS: Metastatic high-grade neoplasm consistent with metastatic malignant melanoma based on the most recent pathology report from [facility] in October 2019, presented with large left left upper/left lower lobe mass with a hypermetabolic AP window lymph node as well as solitary metastatic brain lesion diagnosed in May 2019.  Biomarker Findings Microsatellite status - MS-Stable Tumor Mutational Burden - TMB-Intermediate (16 Muts/Mb) Genomic Findings For a complete list of the genes assayed, please refer to the Appendix. CD274 (PD-L1) amplification NRAS Q61R PDCD1LG2 (PD-L2) amplification MYC amplification EPHB1 amplification JAK2 amplification RB1 Q93*, V361* TERT promoter -146C>T TP53 C275W   PRIOR THERAPY: 1) left temporal craniotomy with resection of tumor with intraoperative stereotactic guidance for volumetric resection under the care of Dr. Annette Stable on 04/29/2018. 2) First-line treatment with immunotherapy with Keytruda 200 mg IV every 3 weeks.  First dose June 02, 2018.  Status post 3 cycles.  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 3) palliative radiotherapy to the left lung mass under the care of Dr. Lisbeth Renshaw completed on 08/24/2018. 4) Resuming her treatment again with Keytruda 200 mg IV every 3 weeks, first dose 09/08/2018.  Status post 2 cycles.  CURRENT THERAPY: Treatment with immunotherapy with ipilimumab 3 mg/KG and nivolumab 1 mg/KG every 3 weeks for the first 4 cycles followed by maintenance nivolumab 240 mg IV every 2 weeks.  First dose of this treatment October 26, 2018.  Status post 40 cycles.  Ipilimumab was discontinued after cycle #2 for the significant liver dysfunction.  Starting from cycle #3 the patient is on treatment with single  agent nivolumab. Starting from cycle #39 the patient is switching to nivolumab 480 mg IV every 4 weeks.  INTERVAL HISTORY: Leslie [name] 45 y.o. female returns to the clinic today for follow-up visit. The patient is feeling much better today. She was found recently on the PET scan to have moderate to large left pneumothorax. The patient had chest tube placed by interventional radiology and she was followed by Dr. Servando Snare and the tube was removed after resolution of the left pneumothorax. She still have occasional soreness in the site of the tube insertion site. She denied having any current chest pain, shortness of breath, cough or hemoptysis. She denied having any fever or chills. She has no nausea, vomiting, diarrhea or constipation. She has no headache or visual changes. She tolerated the last cycle of her treatment with immunotherapy fairly well. The patient mentioned that few months ago she felt something on the left side of the chest but she did not pay attention to it. She did not have any shortness of breath at that time. She continues to be very active and she is a heavy weightlifter. She had a PET scan performed recently and she is here for evaluation and discussion of her scan results and recommendation regarding her condition.  MEDICAL HISTORY: Past Medical History:  Diagnosis Date  . 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 Migraines   . Yeast infection     ALLERGIES:  is allergic to diflucan [fluconazole].  MEDICATIONS:  Current Outpatient Medications  Medication Sig Dispense Refill  . AMERICAN GINSENG PO Take 1,500-3,000 mg by mouth daily. Depending on level of fatigue    . Amino Acids (L-CARNITINE) LIQD Take 2,000 mg by mouth daily.  Liquid L-Carnitine 2000 mg    . Ascorbic Acid 500 MG/15ML LIQD Take 1,000 mg by mouth daily.    . Beta Glucan POWD 1 Dose by Does not apply route daily. Beta D Glucan 400 mg    . Calcium-Magnesium (CAL-MAG PO) Take 15  mLs by mouth daily. Marine Based Cal - Mag (500 mg Calcium / 250 mg Mag) 15 ml total    . Coenzyme Q10 (COQ-10) 100 MG CAPS Take 1 tablet by mouth daily.    Marland Kitchen CREATINE MONOHYDRATE PO Take 6 g by mouth daily. Creatine MonoHydrate 6 g (on training cycle)    . doxylamine, Sleep, (UNISOM) 25 MG tablet Take 12.5 mg by mouth at bedtime as needed for sleep.    . ferrous sulfate 324 MG TBEC Take 324 mg by mouth 2 (two) times daily.    . Glucosamine-Chondroit-Vit C-Mn (GLUCOSAMINE 1500 COMPLEX PO) Take 1 tablet by mouth daily. Vegan CMO Glucosamine    . Hyaluronic Acid-Vitamin C (HYALURONIC ACID PO) Take 100 mg by mouth daily. Liquid Hyaluronic Acid 100 mg    . Iron-Folic Acid-Vit C37 (IRON FORMULA PO) Take 1 Dose by mouth daily. Iron 50 mg (1/2 before workout and 1/2 afterwards - cycle 1 wk off and on)    . Lysine POWD 1,600 mg by Does not apply route daily. L-Lysine Powder 1600 mg    . Methylsulfonylmethane (MSM) 1000 MG CAPS Take 4 capsules by mouth daily. 4000 mg (1/2 before workout and 1/2 afterwards)    . Multiple Vitamin (MULTIVITAMIN) tablet Take 1 tablet by mouth daily.    . Multiple Vitamins-Minerals (MY-VITALIFE) CAPS Take by mouth.    . Multiple Vitamins-Minerals (ZINC PO) Take 1 tablet by mouth daily. Raw Zinc 30 mg (cycle every other week)    . nivolumab (OPDIVO) 40 MG/4ML SOLN chemo injection Opdivo 40 mg/4 mL intravenous solution  INFUSE 40 MG OVER 30 MINUTE(S) BY INTRAVENOUS ROUTE EVERY 2 WEEKS    . NON FORMULARY Take 1 Dose by mouth daily. Cordyceps 750 mg    . NON FORMULARY Take 1 Dose by mouth daily. Liver Herbal Supplement 675 mg    . NON FORMULARY Take 1 Dose by mouth daily. Reishi Mushroom 500 mg    . NON FORMULARY Take 1 Dose by mouth daily. Curcumin Phytosome (Bioperrine) 3000 mg    . NON FORMULARY Take 1 tablet by mouth daily. Artemisnin 200 mg    . NON FORMULARY Take 1 tablet by mouth daily. Wellness Herbal Tablet as needed (Elderberry, Ashwagandha, etc)    . NON FORMULARY Take  1 tablet by mouth daily. Zinc L Carnosine Complex 75/59 mg (after workout)    . Omega 3-6-9 Fatty Acids (OMEGA-3-6-9 PO) Take 2 capsules by mouth daily. Plant Based Omega 3-6-9 2 capsules = 1825 mg    . OVER THE COUNTER MEDICATION TK 5 MILLILITERS PO QID PRF MOUTH PAIN    . QUERCETIN PO Take 1 tablet by mouth daily. Quercetin 400 mg / Bromelain 85 mg    . RESVERATROL PO Take 1,200 mg by mouth daily. Trans Resveratrol    . SM Omega-3-6-9 Fatty Acids CAPS Take by mouth.    . traMADol (ULTRAM) 50 MG tablet Take 1 tablet (50 mg total) by mouth every 6 (six) hours as needed. 30 tablet 0  . VITAMIN A PO Take 1 tablet by mouth daily. 1500 mcg (5000 IU)    . Vitamin D-Vitamin K (VITAMIN K2-VITAMIN D3 PO) Take 1 capsule by mouth daily. 5000 IU +  50 mcg    . vitamin E 180 MG (400 UNITS) capsule Take by mouth.    Marland Kitchen VITAMIN E PO Take 1 tablet by mouth daily. Vegan Vitamin E     Current Facility-Administered Medications  Medication Dose Route Frequency Provider Last Rate Last Admin  . acetaminophen (TYLENOL) tablet 650 mg  650 mg Oral Q6H PRN Elgie Collard, PA-C        SURGICAL HISTORY:  Past Surgical History:  Procedure Laterality Date  . APPLICATION OF CRANIAL NAVIGATION Left 04/29/2018   Procedure: APPLICATION OF CRANIAL NAVIGATION;  Surgeon: Earnie Larsson, MD;  Location: [facility];  Service: Neurosurgery;  Laterality: Left;  . CRANIOTOMY Left 04/29/2018   Procedure: LEFT CRANIOTOMY FOR  TUMOR BRAIN LAB;  Surgeon: Earnie Larsson, MD;  Location: [facility];  Service: Neurosurgery;  Laterality: Left;    REVIEW OF SYSTEMS:  Constitutional: negative Eyes: negative Ears, nose, mouth, throat, and face: negative Respiratory: negative Cardiovascular: negative Gastrointestinal: negative Genitourinary:negative Integument/breast: negative Hematologic/lymphatic: negative Musculoskeletal:negative Neurological: negative Behavioral/Psych: negative Endocrine: negative Allergic/Immunologic: negative   PHYSICAL  EXAMINATION: General appearance: alert, cooperative and no distress Head: Normocephalic, without obvious abnormality, atraumatic Neck: no adenopathy, no JVD, supple, symmetrical, trachea midline and thyroid not enlarged, symmetric, no tenderness/mass/nodules Lymph nodes: Cervical, supraclavicular, and axillary nodes normal. Resp: clear to auscultation bilaterally Back: negative, symmetric, no curvature. ROM normal. No CVA tenderness. Cardio: regular rate and rhythm, S1, S2 normal, no murmur, click, rub or gallop GI: soft, non-tender; bowel sounds normal; no masses,  no organomegaly Extremities: extremities normal, atraumatic, no cyanosis or edema Neurologic: Alert and oriented X 3, normal strength and tone. Normal symmetric reflexes. Normal coordination and gait  ECOG PERFORMANCE STATUS: 0 - Asymptomatic  Blood pressure 118/80, pulse 76, temperature 97.6 F (36.4 C), temperature source Tympanic, resp. rate 18, height 5' 9"  (1.753 m), weight 149 lb 11.2 oz (67.9 kg), SpO2 100 %.  LABORATORY DATA: Lab Results  Component Value Date   WBC 6.2 01/02/2021   HGB 14.0 01/02/2021   HCT 41.4 01/02/2021   MCV 93.7 01/02/2021   PLT 223 01/02/2021      Chemistry      Component Value Date/Time   NA 139 12/02/2020 0910   K 3.9 12/02/2020 0910   CL 107 12/02/2020 0910   CO2 23 12/02/2020 0910   BUN 14 12/02/2020 0910   CREATININE 0.96 12/02/2020 0910      Component Value Date/Time   CALCIUM 9.1 12/02/2020 0910   ALKPHOS 74 12/02/2020 0910   AST 34 12/02/2020 0910   ALT 27 12/02/2020 0910   BILITOT 0.4 12/02/2020 0910       RADIOGRAPHIC STUDIES: DG Chest 2 View  Result Date: 12/31/2020 CLINICAL DATA:  Pneumothorax. EXAM: CHEST - 2 VIEW COMPARISON:  12/27/2020 FINDINGS: Left pleural drain remains in place. Tiny left apical pneumothorax seen previously is no longer evident. Atelectasis/scarring noted left base. Right lung clear. The cardiopericardial silhouette is within normal limits  for size. IMPRESSION: Left pleural drain remains in place with resolution of tiny left apical pneumothorax. Electronically Signed   By: Misty Stanley M.D.   On: 12/31/2020 13:18   NM PET Image Restage (PS) Whole Body  Result Date: 12/26/2020 CLINICAL DATA:  Subsequent treatment strategy for melanoma. EXAM: NUCLEAR MEDICINE PET WHOLE BODY TECHNIQUE: 7.4 mCi F-18 FDG was injected intravenously. Full-ring PET imaging was performed from the head to foot after the radiotracer. CT data was obtained and used for attenuation correction and anatomic localization. Fasting  blood glucose: 84 mg/dl COMPARISON:  Chest abdomen pelvis CT 01/05/2020.  PET-CT 10/20/2019. FINDINGS: Mediastinal blood pool activity: SUV max 1.9 HEAD/NECK: No hypermetabolic activity in the scalp. No hypermetabolic cervical lymph nodes. Incidental CT findings: none CHEST: No hypermetabolic mediastinal or hilar nodes. No suspicious pulmonary nodules on the CT scan. Incidental CT findings: Moderate to large left pneumothorax with associated left pleural effusion. Known lesion in the lingula with presumed adjacent post treatment scarring is distorted secondary to the presence of the pneumothorax but is grossly similar to the prior exam. ABDOMEN/PELVIS: No abnormal hypermetabolic activity within the liver, pancreas, adrenal glands, or spleen. No hypermetabolic lymph nodes in the abdomen or pelvis. Incidental CT findings: Trace free fluid noted in the pelvis, likely physiologic. SKELETON: Focal hypermetabolism identified lateral aspect of the left eighth rib, corresponding to a fracture site. There is some lucency at the fracture which could be related to bony resorption secondary to nonacute injury. Pathologic fracture considered less likely but not excluded. Incidental CT findings: none EXTREMITIES: No abnormal hypermetabolic activity in the lower extremities. Incidental CT findings: none IMPRESSION: 1. Interval development of a moderate to large  left-sided pneumothorax without cardiomediastinal shift. Associated small left pleural effusion. There is a focus of low level hypermetabolism associated with a fracture of the lateral left eighth rib and correlation for recent fall/trauma recommended. Lucency at the fracture site may be related to some resorption secondary to nonacute injury although pathologic fracture not excluded. 2. The soft tissue lesion in the lingula appears grossly stable but is distorted due to the left lung collapse on today's exam. No hypermetabolism in this lesion on today's study. No other additional sites of new or suspicious hypermetabolism on today's exam. Critical Value/emergent results were called by telephone at the time of interpretation on 12/26/2020 at 10:42 am to provider [facility] , who verbally acknowledged these results. Electronically Signed   By: Misty Stanley M.D.   On: 12/26/2020 10:42   DG Chest 2V REPEAT Same day  Result Date: 12/31/2020 CLINICAL DATA:  Chest tube removal. EXAM: CHEST - 2 VIEW SAME DAY COMPARISON:  Earlier film, same date. FINDINGS: The cardiac silhouette mediastinal and hilar contours are within normal limits stable. The left apical chest tube is been removed. No pneumothorax is identified. Stable scarring changes at the left lung base. IMPRESSION: Removal of left-sided chest tube. No pneumothorax. Electronically Signed   By: Marijo Sanes M.D.   On: 12/31/2020 14:21   DG Chest Port 1 View  Result Date: 12/27/2020 CLINICAL DATA:  History of metastatic melanoma now with spontaneous left-sided pneumothorax, post chest tube placement EXAM: PORTABLE CHEST 1 VIEW COMPARISON:  04/27/2018; PET-CT-12/26/2020 FINDINGS: Imaged grossly unchanged cardiac silhouette and mediastinal contours. Tiny residual left apical pneumothorax following CT-guided chest tube placement. Improved aeration of the left lung with postoperative change of the left lung base and associated left basilar  heterogeneous opacities favored represent atelectasis or scar. Trace left-sided effusion is not excluded. The right hemithorax remains air well aerated. No new focal airspace opacities. No acute osseous abnormalities. IMPRESSION: 1. Tiny residual left apical pneumothorax following CT-guided chest tube placement. 2. Improved aeration of the left lung with postoperative change of the left lung base no associated left basilar atelectasis/scar. Electronically Signed   By: Sandi Mariscal M.D.   On: 12/27/2020 14:16   CT [facility] PLEURAL DRAIN W/INDWELL CATH W/IMG GUIDE  Result Date: 12/27/2020 INDICATION: History of metastatic melanoma now with spontaneous left-sided pneumothorax incidentally noted staging PET-CT performed 12/26/2020.  Please perform CT-guided chest tube placement. EXAM: CT PERC PLEURAL DRAIN W/INDWELL CATH W/IMG GUIDE COMPARISON:  PET-CT-12/26/2020 MEDICATIONS: The patient is currently admitted to the hospital and receiving intravenous antibiotics. The antibiotics were administered within an appropriate time frame prior to the initiation of the procedure. ANESTHESIA/SEDATION: Moderate (conscious) sedation was employed during this procedure. A total of Versed 1.5 mg and Fentanyl 50 mcg was administered intravenously. Moderate Sedation Time: 16 minutes. The patient's level of consciousness and vital signs were monitored continuously by radiology nursing throughout the procedure under my direct supervision. CONTRAST:  None COMPLICATIONS: None immediate. PROCEDURE: Informed written consent was obtained from the patient after a discussion of the risks, benefits and alternatives to treatment. The patient was placed supine on the CT gantry and a pre procedural CT was performed re-demonstrating the known moderate-sized left-sided pneumothorax. The procedure was planned. A timeout was performed prior to the initiation of the procedure. The skin overlying the cranial aspect the left midclavicular line was prepped and  draped in the usual sterile fashion. The overlying soft tissues were anesthetized with 1% lidocaine with epinephrine. Appropriate trajectory was planned with the use of a 22 gauge spinal needle. An 18 gauge trocar needle was advanced into the left pleural space and a short Amplatz super stiff wire was coiled within the collection. Appropriate positioning was confirmed with a limited CT scan. The tract was serially dilated allowing placement of a 12 [place] all-purpose drainage catheter. Appropriate positioning was confirmed with a limited postprocedural CT scan. Next, nearly all the air was aspirated from the left pleural space with the use of a 3 way stopcock. Postprocedural imaging demonstrates appropriate positioning of left-sided chest tube with near complete resolution of the left-sided pneumothorax. The left-sided chest tube was connected to a express pleura vac device and sutured in place. A dressing was applied. The patient tolerated the procedure well without immediate post procedural complication. IMPRESSION: Successful CT guided placement of a 87 French all purpose drain catheter into the apical aspect the left pleural space resulting in near complete resolution of left-sided pneumothorax. Additional chest tube management at the discretion of Dr. Servando Snare. Electronically Signed   By: Sandi Mariscal M.D.   On: 12/27/2020 14:16    ASSESSMENT AND PLAN: This is a very pleasant 45 years old white female with highly suspicious metastatic malignant melanoma presented with large mass in the left upper/left lower lobe and mediastinal lymphadenopathy as well as solitary brain metastasis status post left temporal craniotomy and resection of tumor on 04/29/2018 and she is recovering well from her surgery. The patient completed stereotactic radiotherapy to the resection cavity next week under the care of Dr. Lisbeth Renshaw. The patient underwent treatment with immunotherapy with Keytruda 200 mg IV every 3 weeks status post 3  cycles.   Her scan after cycle #3 showed enlargement of the left upper lobe lung mass.  This was also suspicious for pseudo-progression on immunotherapy.  The patient was started on a palliative course of radiotherapy to the left upper lobe lung mass and she tolerated this treatment fairly well.  She was seen by Dr. Emelda Brothers at [facility] and she recommended for the patient to resume her treatment with [facility] for now until she undergoes further molecular studies.  The patient was treated with 2 more cycles of Keytruda and tolerated the treatment well. Her recent CT scan of the chest, abdomen and pelvis showed some improvement in the left upper lobe lung mass but there is still concern about pericardial invasion.  I personally and independently reviewed the scan images and discussed the result and showed the images to the patient and her boyfriend today.  She is still not a good candidate for surgical resection because of the pericardial invasion. The final pathology report and recommendation from [facility] was consistent with metastatic melanoma and the recommendation is to switch the patient to a combination immunotherapy with Ipilumumab and nivolumab. The patient underwent treatment with immunotherapy with ipilimumab and nivolumab status post 2 cycles.  Her treatment was on hold for more than 6 weeks secondary to grade 3 hepatic dysfunction.  She had improvement of her liver enzyme after a prolonged treatment with a steroid with a tapering schedule. She resumed her treatment again with single agent nivolumab.  She is status post 40 cycles of treatment. She continues to tolerate her treatment well with no concerning adverse effects. Starting from cycle #39 the patient will be treated with nivolumab 480 mg IV every 4 weeks. The patient has been tolerating this treatment well with no concerning adverse effects. She had a recent PET scan that showed no concerning findings for  disease recurrence or progression. I discussed with the patient the options for treatment of her condition including continuous observation from now since there is no evidence for any residual disease based on the PET scan. She is interested in this option and not to continue immunotherapy unless absolutely needed. I will arrange for her to come back for follow-up visit in 3 months with repeat CT scan of the chest, abdomen pelvis for restaging of her disease. For the left pneumothorax, this could be traumatic in nature secondary to her aggressive physical activity and heavy weightlifting. This was drained with a pigtail chest tube which was removed after resolution of the pneumothorax. We will continue to monitor her closely for any symptoms. The patient was advised to call immediately if she has any other concerning symptoms in the interval. The patient voices understanding of current disease status and treatment options and is in agreement with the current care plan. All questions were answered. The patient knows to call the clinic with any problems, questions or concerns. We can certainly see the patient much sooner if necessary.  Disclaimer: This note was dictated with voice recognition software. Similar sounding words can inadvertently be transcribed and may not be corrected upon review.

## 2021-01-06 ENCOUNTER — Other Ambulatory Visit: Payer: Self-pay

## 2021-01-06 DIAGNOSIS — J9383 Other pneumothorax: Secondary | ICD-10-CM

## 2021-01-09 ENCOUNTER — Ambulatory Visit (INDEPENDENT_AMBULATORY_CARE_PROVIDER_SITE_OTHER): Payer: BC Managed Care – PPO | Admitting: Cardiothoracic Surgery

## 2021-01-09 ENCOUNTER — Other Ambulatory Visit: Payer: Self-pay

## 2021-01-09 ENCOUNTER — Ambulatory Visit
Admission: RE | Admit: 2021-01-09 | Discharge: 2021-01-09 | Disposition: A | Discharge status: Home / Self Care | Payer: BC Managed Care – PPO | Source: Ambulatory Visit | Attending: Cardiothoracic Surgery | Admitting: Cardiothoracic Surgery

## 2021-01-09 VITALS — BP 109/71 | HR 64 | Temp 97.8°F | Resp 20 | Ht 69.0 in | Wt 150.0 lb

## 2021-01-09 DIAGNOSIS — J9383 Other pneumothorax: Secondary | ICD-10-CM | POA: Diagnosis not present

## 2021-01-09 DIAGNOSIS — J984 Other disorders of lung: Secondary | ICD-10-CM | POA: Diagnosis not present

## 2021-01-09 DIAGNOSIS — J939 Pneumothorax, unspecified: Secondary | ICD-10-CM | POA: Diagnosis not present

## 2021-01-12 NOTE — Progress Notes (Signed)
[address]       [place] [address]             [phone]      Jahna Oliva-Kroll Marquez Medical Record #[id number] Date of Birth: [birth year]  Referring: Isaac Bliss, [facility]* Primary Care: Isaac Bliss, Rayford Halsted, MD Primary Cardiologist: No primary care provider on file.   Chief Complaint:   POST OP FOLLOW UP Chest tube placement   History of Present Illness:     Patient returns to the office for follow-up chest x-ray.  2 weeks ago she presented for a routine follow-up with PET scan.  This demonstrated approximately 40% left pneumothorax.  A interventional radiology placed chest tube cleared the pneumothorax and patient was managed with the chest tube at home the following week the chest tube was removed without difficulty she returns today for follow-up chest x-ray.  She notes she has been doing well without significant shortness of breath.  She also specifically denies any point or rib tenderness over the area of slight hypermetabolic activity in possible rib fracture on the left eighth rib.      Past Medical History:  Diagnosis Date  . 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 Migraines   . Yeast infection      Social History   Tobacco Use  Smoking Status Never Smoker  Smokeless Tobacco Never Used    Social History   Substance and Sexual Activity  Alcohol Use Yes     Allergies  Allergen Reactions  . Diflucan [Fluconazole] Other (See Comments)    Wheezing and throat pressue    Current Outpatient Medications  Medication Sig Dispense Refill  . AMERICAN GINSENG PO Take 1,500-3,000 mg by mouth daily. Depending on level of fatigue    . Amino Acids (L-CARNITINE) LIQD Take 2,000 mg by mouth daily. Liquid L-Carnitine 2000 mg    . Ascorbic Acid 500 MG/15ML LIQD Take 1,000 mg by mouth daily.    . Beta Glucan POWD 1 Dose by Does not apply route daily. Beta D Glucan 400 mg    . Calcium-Magnesium (CAL-MAG  PO) Take 15 mLs by mouth daily. Marine Based Cal - Mag (500 mg Calcium / 250 mg Mag) 15 ml total    . Coenzyme Q10 (COQ-10) 100 MG CAPS Take 1 tablet by mouth daily.    Marland Kitchen CREATINE MONOHYDRATE PO Take 6 g by mouth daily. Creatine MonoHydrate 6 g (on training cycle)    . doxylamine, Sleep, (UNISOM) 25 MG tablet Take 12.5 mg by mouth at bedtime as needed for sleep.    . ferrous sulfate 324 MG TBEC Take 324 mg by mouth 2 (two) times daily.    . Glucosamine-Chondroit-Vit C-Mn (GLUCOSAMINE 1500 COMPLEX PO) Take 1 tablet by mouth daily. Vegan CMO Glucosamine    . Hyaluronic Acid-Vitamin C (HYALURONIC ACID PO) Take 100 mg by mouth daily. Liquid Hyaluronic Acid 100 mg    . Iron-Folic Acid-Vit N36 (IRON FORMULA PO) Take 1 Dose by mouth daily. Iron 50 mg (1/2 before workout and 1/2 afterwards - cycle 1 wk off and on)    . Lysine POWD 1,600 mg by Does not apply route daily. L-Lysine Powder 1600 mg    . Methylsulfonylmethane (MSM) 1000 MG CAPS Take 4 capsules by mouth daily. 4000 mg (1/2 before workout and 1/2 afterwards)    . Multiple Vitamin (MULTIVITAMIN) tablet Take 1 tablet by mouth daily.    Marland Kitchen  Multiple Vitamins-Minerals (MY-VITALIFE) CAPS Take by mouth.    . Multiple Vitamins-Minerals (ZINC PO) Take 1 tablet by mouth daily. Raw Zinc 30 mg (cycle every other week)    . nivolumab (OPDIVO) 40 MG/4ML SOLN chemo injection Opdivo 40 mg/4 mL intravenous solution  INFUSE 40 MG OVER 30 MINUTE(S) BY INTRAVENOUS ROUTE EVERY 2 WEEKS    . NON FORMULARY Take 1 Dose by mouth daily. Cordyceps 750 mg    . NON FORMULARY Take 1 Dose by mouth daily. Liver Herbal Supplement 675 mg    . NON FORMULARY Take 1 Dose by mouth daily. Reishi Mushroom 500 mg    . NON FORMULARY Take 1 Dose by mouth daily. Curcumin Phytosome (Bioperrine) 3000 mg    . NON FORMULARY Take 1 tablet by mouth daily. Artemisnin 200 mg    . NON FORMULARY Take 1 tablet by mouth daily. Wellness Herbal Tablet as needed (Elderberry, Ashwagandha, etc)    . NON  FORMULARY Take 1 tablet by mouth daily. Zinc L Carnosine Complex 75/59 mg (after workout)    . Omega 3-6-9 Fatty Acids (OMEGA-3-6-9 PO) Take 2 capsules by mouth daily. Plant Based Omega 3-6-9 2 capsules = 1825 mg    . OVER THE COUNTER MEDICATION TK 5 MILLILITERS PO QID PRF MOUTH PAIN    . QUERCETIN PO Take 1 tablet by mouth daily. Quercetin 400 mg / Bromelain 85 mg    . RESVERATROL PO Take 1,200 mg by mouth daily. Trans Resveratrol    . SM Omega-3-6-9 Fatty Acids CAPS Take by mouth.    . traMADol (ULTRAM) 50 MG tablet Take 1 tablet (50 mg total) by mouth every 6 (six) hours as needed. 30 tablet 0  . VITAMIN A PO Take 1 tablet by mouth daily. 1500 mcg (5000 IU)    . Vitamin D-Vitamin K (VITAMIN K2-VITAMIN D3 PO) Take 1 capsule by mouth daily. 5000 IU + 50 mcg    . vitamin E 180 MG (400 UNITS) capsule Take by mouth.    Marland Kitchen VITAMIN E PO Take 1 tablet by mouth daily. Vegan Vitamin E     Current Facility-Administered Medications  Medication Dose Route Frequency Provider Last Rate Last Admin  . acetaminophen (TYLENOL) tablet 650 mg  650 mg Oral Q6H PRN Elgie Collard, PA-C           Physical Exam: BP 109/71   Pulse 64   Temp 97.8 F (36.6 C) (Skin)   Resp 20   Ht 5' 9"  (1.753 m)   Wt 150 lb (68 kg)   LMP 01/07/2021   SpO2 98% Comment: RA  BMI 22.15 kg/m   General appearance: alert, cooperative and no distress Neurologic: intact Heart: regular rate and rhythm, S1, S2 normal, no murmur, click, rub or gallop Lungs: clear to auscultation bilaterally Wound: chets tube site intact   Diagnostic Studies & Laboratory data:     Recent Radiology Findings:  DG Chest 2 View  Result Date: 01/09/2021 CLINICAL DATA:  Follow-up pneumothorax.  No current complaints. EXAM: CHEST - 2 VIEW COMPARISON:  Chest radiograph 12/31/2020 FINDINGS: Stable cardiomediastinal contours. There is stable scarring at the left lung base. No new focal consolidation. No pneumothorax or pleural effusion. No acute  finding in the visualized skeleton. IMPRESSION: No acute cardiopulmonary process. Stable scarring at the left lung base. No evidence of pneumothorax. Electronically Signed   By: Audie Pinto M.D.   On: 01/09/2021 14:37     Recent Lab Findings: Lab Results  Component Value  Date   WBC 6.2 01/02/2021   HGB 14.0 01/02/2021   HCT 41.4 01/02/2021   PLT 223 01/02/2021   GLUCOSE 87 01/02/2021   CHOL 159 11/22/2020   TRIG 67.0 11/22/2020   HDL 59.90 11/22/2020   LDLCALC 86 11/22/2020   ALT 18 01/02/2021   AST 24 01/02/2021   NA 138 01/02/2021   K 4.3 01/02/2021   CL 107 01/02/2021   CREATININE 0.86 01/02/2021   BUN 20 01/02/2021   CO2 22 01/02/2021   TSH 2.217 01/02/2021   INR 1.0 12/27/2020   HGBA1C 5.0 11/22/2020      Assessment / Plan:     Return 4 weeks with chest xray and left rib details    Medication Changes: No orders of the defined types were placed in this encounter.     Grace Isaac MD      [address] [place] [address] Office [phone]     01/12/2021 2:08 PM

## 2021-01-15 ENCOUNTER — Encounter: Payer: Self-pay | Admitting: Cardiothoracic Surgery

## 2021-01-23 ENCOUNTER — Encounter: Payer: Self-pay | Admitting: Internal Medicine

## 2021-01-24 ENCOUNTER — Telehealth: Payer: Self-pay | Admitting: Internal Medicine

## 2021-01-24 NOTE — Telephone Encounter (Signed)
R/s appt per 2/10 sch msg - pt is aware of new appt date and time

## 2021-01-27 ENCOUNTER — Inpatient Hospital Stay: Payer: BC Managed Care – PPO

## 2021-01-27 ENCOUNTER — Inpatient Hospital Stay: Payer: BC Managed Care – PPO | Admitting: Internal Medicine

## 2021-01-30 IMAGING — CT CT ABD-PELV W/ CM
2 of 5 series · 12 of 36 positions shown, 15 images · IV contrast (APPLIED)
Comparison: 10/18/2018

CLINICAL DATA: Stage IV melanoma diagnosed [REDACTED]. Brain
metastasis. Lung and lymph node metastasis. Surgery and radiation
therapy to brain. Immunotherapy.

EXAM:
CT CHEST, ABDOMEN, AND PELVIS WITH CONTRAST
TECHNIQUE: Multidetector CT imaging of the chest, abdomen and pelvis was
performed following the standard protocol during bolus
administration of intravenous contrast.
CONTRAST:  100mL OMNIPAQUE IOHEXOL 300 MG/ML  SOLN

[Series 2: cap with · axial · 0.64mm/px · z∈[-622,-87]mm · 9 of 131 slices shown, 12 images]
[im 12/131  mediastinal]
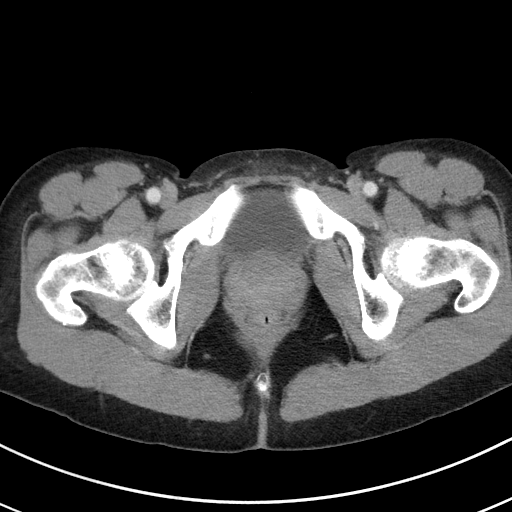
[im 12/131  lung]
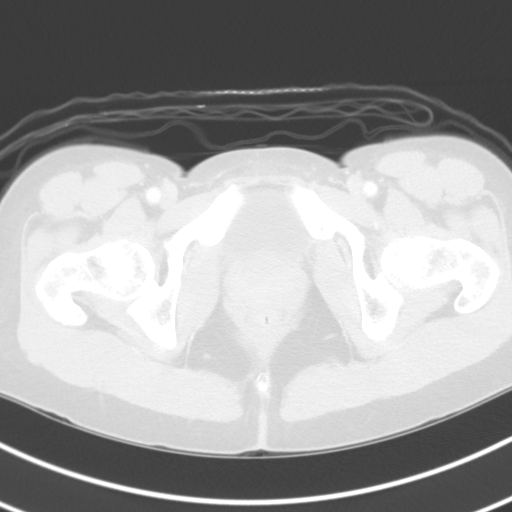
[im 24/131  lung]
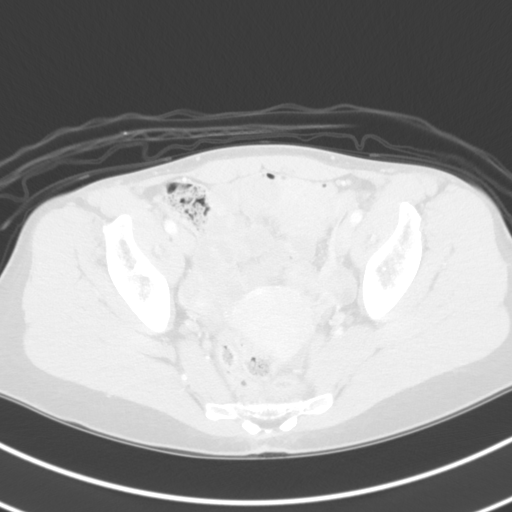
[im 36/131  lung]
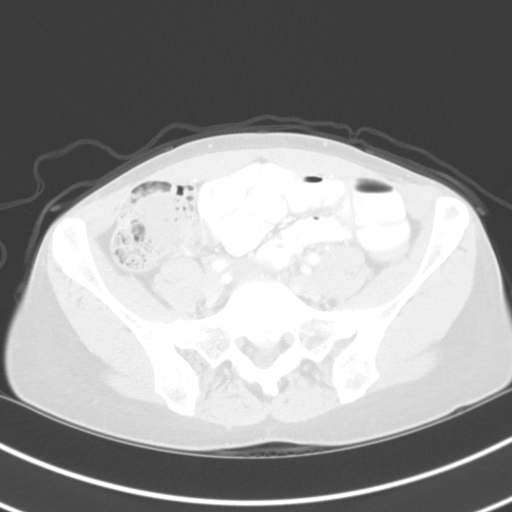
[im 48/131  lung]
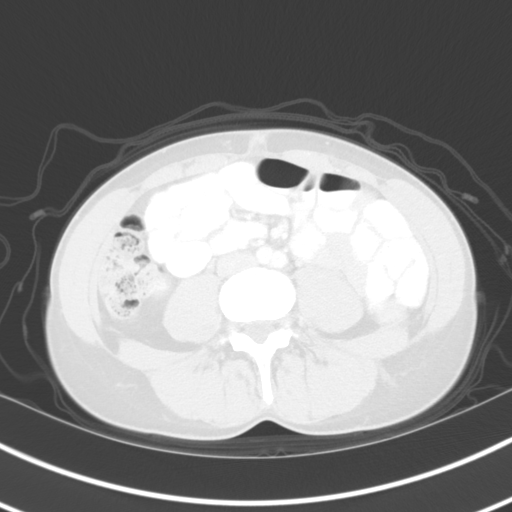
[im 71/131  mediastinal]
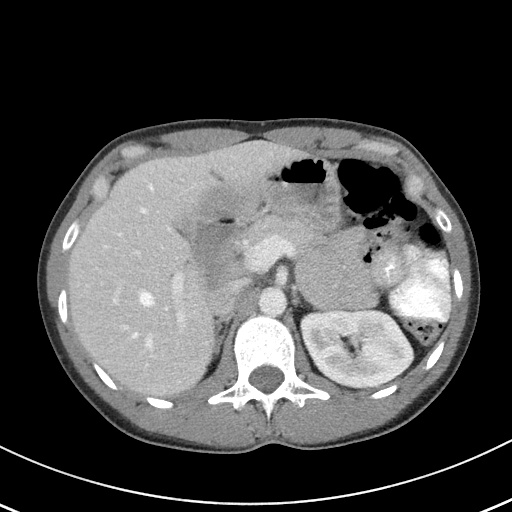
[im 71/131  lung]
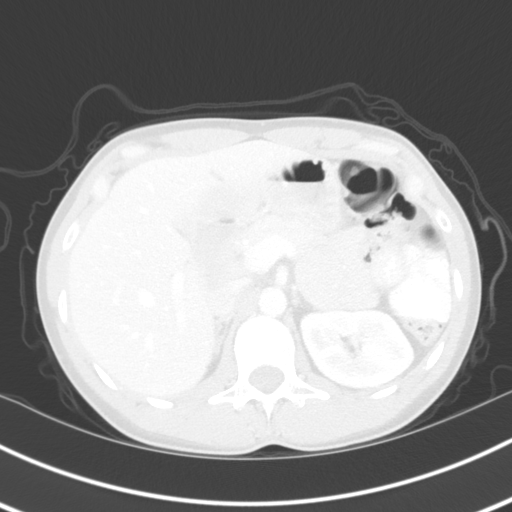
[im 83/131  lung]
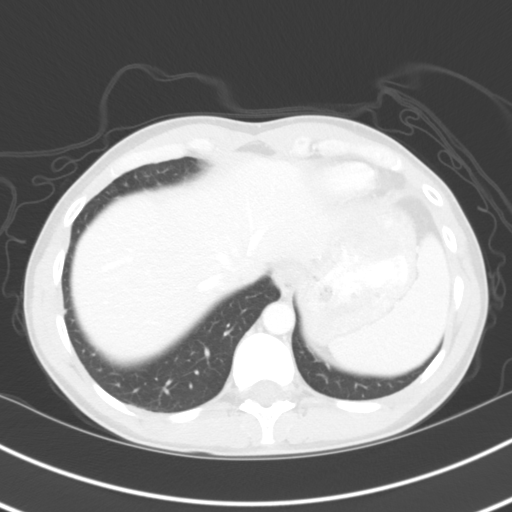
[im 95/131  lung]
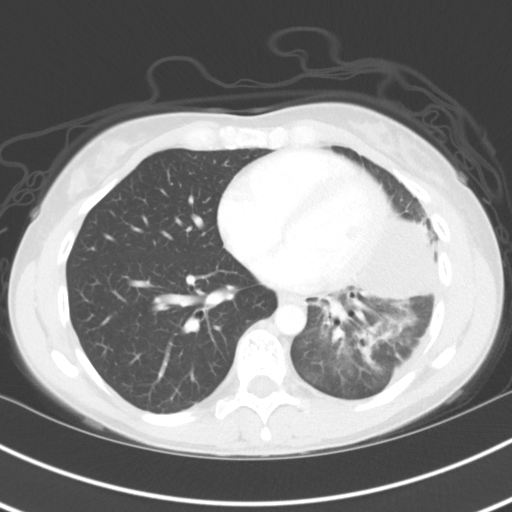
[im 107/131  lung]
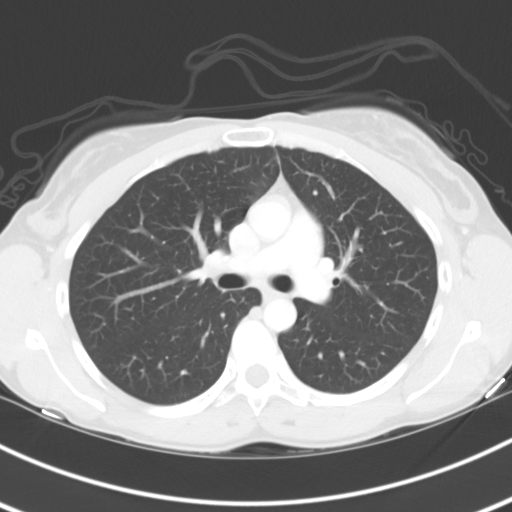
[im 119/131  mediastinal]
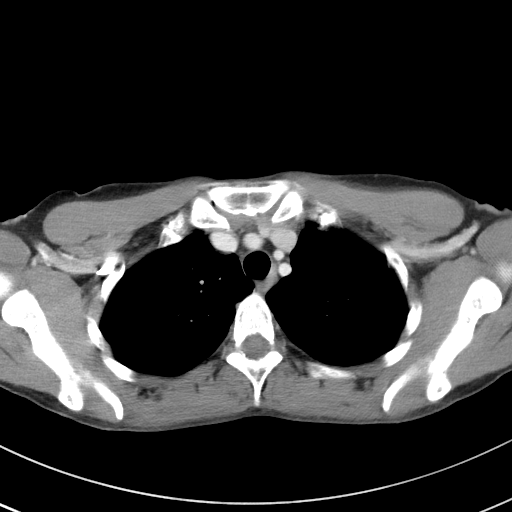
[im 119/131  lung]
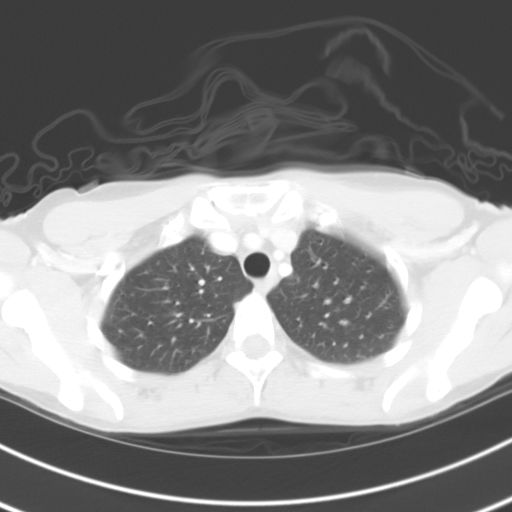

[Series 5: coronals · coronal · 0.70mm/px · 3 of 111 slices shown]
[im 23/111  lung]
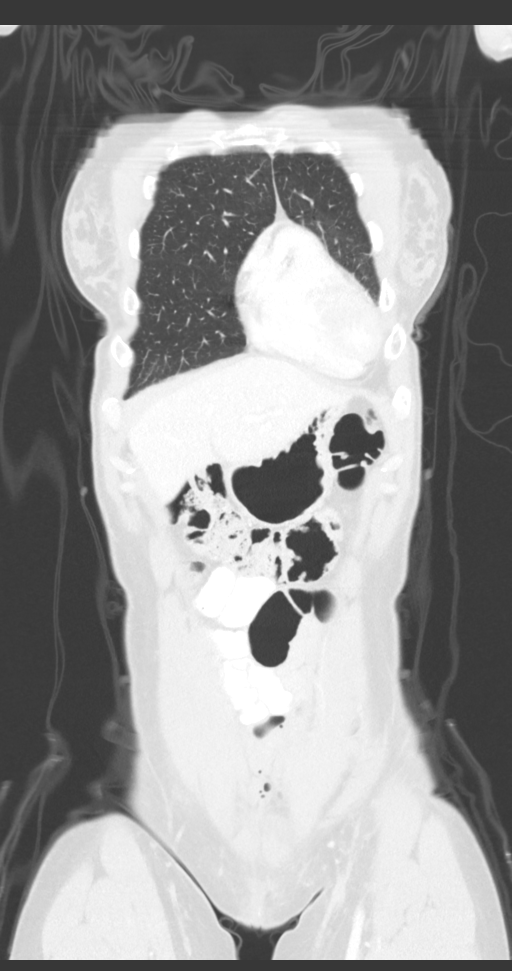
[im 45/111  lung]
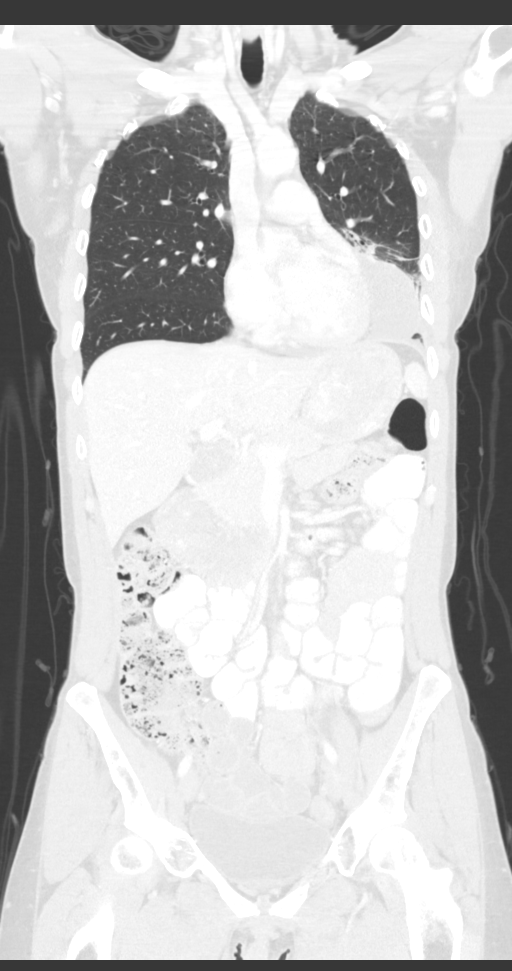
[im 67/111  lung]
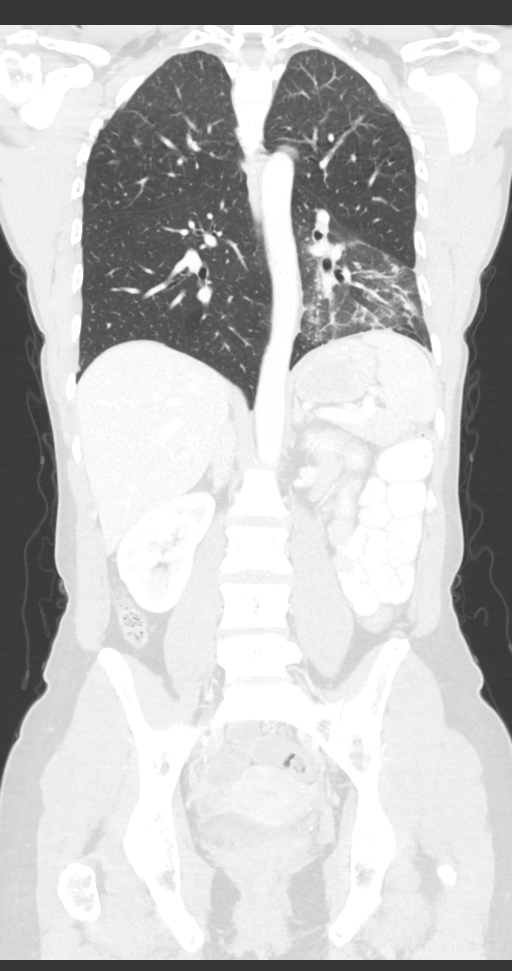

[12 of 36 positions shown; findings below may reference images not displayed]

FINDINGS: CT CHEST FINDINGS

Cardiovascular: Normal caliber of the aorta and branch vessels.
Normal heart size, without pericardial effusion. No central
pulmonary embolism, on this non-dedicated study.

Mediastinum/Nodes: No supraclavicular adenopathy. No axillary
adenopathy. No mediastinal or hilar adenopathy. Mild residual thymic
tissue in the anterior mediastinum.

Lungs/Pleura: Minimal left pleural thickening. Mild motion
degradation superiorly.

Similar 5 mm left apical pulmonary nodule on image [DATE].

Worsened left lower lobe and lingular volume loss with airspace
disease.

The lingular mass with central necrosis and intimate association to
the pericardium measures 5.8 x 4.2 cm on image 40/2. Compare 6.6 x
5.0 cm on the prior exam. 5.2 cm craniocaudal today versus 6.7 cm on
the prior (when remeasured).

Musculoskeletal: No acute osseous abnormality.

CT ABDOMEN PELVIS FINDINGS

Hepatobiliary: Normal liver. Normal gallbladder, without biliary
ductal dilatation.

Pancreas: Normal, without mass or ductal dilatation.

Spleen: Normal in size, without focal abnormality.

Adrenals/Urinary Tract: Normal adrenal glands. Normal kidneys,
without hydronephrosis. Normal urinary bladder.

Stomach/Bowel: Normal stomach, without wall thickening. Normal colon
and terminal ileum. The proximal transverse duodenum is mildly
prominent, including on image 73/2. No cause identified. Small bowel
loops are otherwise normal in caliber.

Vascular/Lymphatic: Normal caliber of the aorta and branch vessels.
No abdominopelvic adenopathy.

Reproductive: Normal uterus and adnexa.

Other: Trace free pelvic fluid is likely physiologic. No abdominal
ascites. No evidence of omental or peritoneal disease.

Musculoskeletal: Tiny pelvic sclerotic lesions are similar and
likely bone islands.
IMPRESSION: 1. Response to therapy, as evidenced by decreased size of a mass
within the left hemithorax, intimately associated with the
pericardium.
2. Worsened lingular and left lower lobe aeration with increased
volume loss. Pulmonary opacities are favored to represent
atelectasis. Especially in the left lower lobe, infection cannot be
excluded. Correlate with infectious symptoms.
3. Motion degraded evaluation of the chest. Left apical pulmonary
nodule is similar.
4. No findings of metastatic disease in the abdomen or pelvis.

## 2021-02-03 DIAGNOSIS — F419 Anxiety disorder, unspecified: Secondary | ICD-10-CM | POA: Diagnosis not present

## 2021-02-13 ENCOUNTER — Other Ambulatory Visit: Payer: Self-pay

## 2021-02-13 ENCOUNTER — Ambulatory Visit
Admission: RE | Admit: 2021-02-13 | Discharge: 2021-02-13 | Disposition: A | Discharge status: Home / Self Care | Payer: BC Managed Care – PPO | Source: Ambulatory Visit | Attending: Cardiothoracic Surgery | Admitting: Cardiothoracic Surgery

## 2021-02-13 ENCOUNTER — Ambulatory Visit (INDEPENDENT_AMBULATORY_CARE_PROVIDER_SITE_OTHER): Payer: BC Managed Care – PPO | Admitting: Cardiothoracic Surgery

## 2021-02-13 VITALS — BP 129/79 | HR 77 | Resp 20 | Ht 69.0 in | Wt 149.0 lb

## 2021-02-13 DIAGNOSIS — S270XXA Traumatic pneumothorax, initial encounter: Secondary | ICD-10-CM

## 2021-02-13 DIAGNOSIS — J984 Other disorders of lung: Secondary | ICD-10-CM | POA: Diagnosis not present

## 2021-02-13 DIAGNOSIS — J939 Pneumothorax, unspecified: Secondary | ICD-10-CM | POA: Diagnosis not present

## 2021-02-13 DIAGNOSIS — J95811 Postprocedural pneumothorax: Secondary | ICD-10-CM | POA: Diagnosis not present

## 2021-02-13 DIAGNOSIS — J9383 Other pneumothorax: Secondary | ICD-10-CM | POA: Diagnosis not present

## 2021-02-16 NOTE — Progress Notes (Signed)
[address]       [place] [address]             [phone]      Leslie [name] [place] Medical Record #[id number] Date of Birth: July 26, 1976  Referring: Isaac Bliss, [facility]* Primary Care: Isaac Bliss, Rayford Halsted, MD Primary Cardiologist: No primary care provider on file.   Chief Complaint:   POST OP FOLLOW UP Chest tube placement   History of Present Illness:     Patient returns to the office for follow-up chest x-ray.  6 weeks ago she presented for a routine follow-up with PET scan.  This demonstrated approximately 40% left pneumothorax.   interventional radiology placed chest tube cleared the pneumothorax and patient was managed with the chest tube at home the following week the chest tube was removed without difficulty she returns today for follow-up chest x-ray.   She also specifically denies any point or rib tenderness over the area of slight hypermetabolic activity in possible rib fracture on the left eighth rib.      Past Medical History:  Diagnosis Date  . 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 Migraines   . Yeast infection      Social History   Tobacco Use  Smoking Status Never Smoker  Smokeless Tobacco Never Used    Social History   Substance and Sexual Activity  Alcohol Use Yes     Allergies  Allergen Reactions  . Diflucan [Fluconazole] Other (See Comments)    Wheezing and throat pressue    Current Outpatient Medications  Medication Sig Dispense Refill  . AMERICAN GINSENG PO Take 1,500-3,000 mg by mouth daily. Depending on level of fatigue    . Amino Acids (L-CARNITINE) LIQD Take 2,000 mg by mouth daily. Liquid L-Carnitine 2000 mg    . Ascorbic Acid 500 MG/15ML LIQD Take 1,000 mg by mouth daily.    . Beta Glucan POWD 1 Dose by Does not apply route daily. Beta D Glucan 400 mg    . Calcium-Magnesium (CAL-MAG PO) Take 15 mLs by mouth daily. Marine Based Cal - Mag (500 mg Calcium / 250  mg Mag) 15 ml total    . Coenzyme Q10 (COQ-10) 100 MG CAPS Take 1 tablet by mouth daily.    Marland Kitchen CREATINE MONOHYDRATE PO Take 6 g by mouth daily. Creatine MonoHydrate 6 g (on training cycle)    . doxylamine, Sleep, (UNISOM) 25 MG tablet Take 12.5 mg by mouth at bedtime as needed for sleep.    . ferrous sulfate 324 MG TBEC Take 324 mg by mouth 2 (two) times daily.    . Glucosamine-Chondroit-Vit C-Mn (GLUCOSAMINE 1500 COMPLEX PO) Take 1 tablet by mouth daily. Vegan CMO Glucosamine    . Hyaluronic Acid-Vitamin C (HYALURONIC ACID PO) Take 100 mg by mouth daily. Liquid Hyaluronic Acid 100 mg    . Iron-Folic Acid-Vit O87 (IRON FORMULA PO) Take 1 Dose by mouth daily. Iron 50 mg (1/2 before workout and 1/2 afterwards - cycle 1 wk off and on)    . Lysine POWD 1,600 mg by Does not apply route daily. L-Lysine Powder 1600 mg    . Methylsulfonylmethane (MSM) 1000 MG CAPS Take 4 capsules by mouth daily. 4000 mg (1/2 before workout and 1/2 afterwards)    . Multiple Vitamin (MULTIVITAMIN) tablet Take 1 tablet by mouth daily.    . Multiple Vitamins-Minerals (MY-VITALIFE) CAPS Take by mouth.    . Multiple  Vitamins-Minerals (ZINC PO) Take 1 tablet by mouth daily. Raw Zinc 30 mg (cycle every other week)    . nivolumab (OPDIVO) 40 MG/4ML SOLN chemo injection Opdivo 40 mg/4 mL intravenous solution  INFUSE 40 MG OVER 30 MINUTE(S) BY INTRAVENOUS ROUTE EVERY 2 WEEKS    . NON FORMULARY Take 1 Dose by mouth daily. Cordyceps 750 mg    . NON FORMULARY Take 1 Dose by mouth daily. Liver Herbal Supplement 675 mg    . NON FORMULARY Take 1 Dose by mouth daily. Reishi Mushroom 500 mg    . NON FORMULARY Take 1 Dose by mouth daily. Curcumin Phytosome (Bioperrine) 3000 mg    . NON FORMULARY Take 1 tablet by mouth daily. Artemisnin 200 mg    . NON FORMULARY Take 1 tablet by mouth daily. Wellness Herbal Tablet as needed (Elderberry, Ashwagandha, etc)    . NON FORMULARY Take 1 tablet by mouth daily. Zinc L Carnosine Complex 75/59 mg  (after workout)    . Omega 3-6-9 Fatty Acids (OMEGA-3-6-9 PO) Take 2 capsules by mouth daily. Plant Based Omega 3-6-9 2 capsules = 1825 mg    . OVER THE COUNTER MEDICATION TK 5 MILLILITERS PO QID PRF MOUTH PAIN    . QUERCETIN PO Take 1 tablet by mouth daily. Quercetin 400 mg / Bromelain 85 mg    . RESVERATROL PO Take 1,200 mg by mouth daily. Trans Resveratrol    . SM Omega-3-6-9 Fatty Acids CAPS Take by mouth.    . traMADol (ULTRAM) 50 MG tablet Take 1 tablet (50 mg total) by mouth every 6 (six) hours as needed. 30 tablet 0  . VITAMIN A PO Take 1 tablet by mouth daily. 1500 mcg (5000 IU)    . Vitamin D-Vitamin K (VITAMIN K2-VITAMIN D3 PO) Take 1 capsule by mouth daily. 5000 IU + 50 mcg    . vitamin E 180 MG (400 UNITS) capsule Take by mouth.    Marland Kitchen VITAMIN E PO Take 1 tablet by mouth daily. Vegan Vitamin E     Current Facility-Administered Medications  Medication Dose Route Frequency Provider Last Rate Last Admin  . acetaminophen (TYLENOL) tablet 650 mg  650 mg Oral Q6H PRN Elgie Collard, PA-C           Physical Exam: BP 129/79   Pulse 77   Resp 20   Ht 5' 9"  (1.753 m)   Wt 149 lb (67.6 kg)   LMP 01/30/2021   SpO2 98% Comment: RA  BMI 22.00 kg/m   General appearance: alert, cooperative and no distress Resp: clear to auscultation bilaterally Cardio: regular rate and rhythm, S1, S2 normal, no murmur, click, rub or gallop Neurologic: Grossly normal  Diagnostic Studies & Laboratory data:     Recent Radiology Findings:  DG Chest 2 View  Result Date: 02/13/2021 CLINICAL DATA:  Follow-up left pneumothorax. EXAM: CHEST - 2 VIEW COMPARISON:  01/09/2021 in 12/31/2020. FINDINGS: Stable scarring at the left lung base. Normal sized heart. Clear right lung. No pneumothorax. Unremarkable bones. IMPRESSION: No acute abnormality. Electronically Signed   By: Claudie Revering M.D.   On: 02/13/2021 14:04    I have independently reviewed the above  cath films and reviewed the findings with the   patient .  xr  Recent Lab Findings: Lab Results  Component Value Date   WBC 6.2 01/02/2021   HGB 14.0 01/02/2021   HCT 41.4 01/02/2021   PLT 223 01/02/2021   GLUCOSE 87 01/02/2021   CHOL 159 11/22/2020  TRIG 67.0 11/22/2020   HDL 59.90 11/22/2020   LDLCALC 86 11/22/2020   ALT 18 01/02/2021   AST 24 01/02/2021   NA 138 01/02/2021   K 4.3 01/02/2021   CL 107 01/02/2021   CREATININE 0.86 01/02/2021   BUN 20 01/02/2021   CO2 22 01/02/2021   TSH 2.217 01/02/2021   INR 1.0 12/27/2020   HGBA1C 5.0 11/22/2020      Assessment / Plan:   Follow-up chest x-ray shows clear lung fields-not sufficient detail on chest x-ray to tell about the left rib fracture  Return as needed - patient has follow up ct of chest in mid April- can evaluate left rib fracture  at that time    Medication Changes: No orders of the defined types were placed in this encounter.     Grace Isaac MD      [address] [place] [address] Office [phone]     02/16/2021 9:01 PM

## 2021-02-17 DIAGNOSIS — M7632 Iliotibial band syndrome, left leg: Secondary | ICD-10-CM | POA: Diagnosis not present

## 2021-02-17 DIAGNOSIS — M9903 Segmental and somatic dysfunction of lumbar region: Secondary | ICD-10-CM | POA: Diagnosis not present

## 2021-02-17 DIAGNOSIS — M2560 Stiffness of unspecified joint, not elsewhere classified: Secondary | ICD-10-CM | POA: Diagnosis not present

## 2021-02-19 DIAGNOSIS — C792 Secondary malignant neoplasm of skin: Secondary | ICD-10-CM | POA: Diagnosis not present

## 2021-02-19 DIAGNOSIS — Z9889 Other specified postprocedural states: Secondary | ICD-10-CM | POA: Diagnosis not present

## 2021-02-19 DIAGNOSIS — C439 Malignant melanoma of skin, unspecified: Secondary | ICD-10-CM | POA: Diagnosis not present

## 2021-02-19 DIAGNOSIS — C7931 Secondary malignant neoplasm of brain: Secondary | ICD-10-CM | POA: Diagnosis not present

## 2021-02-19 DIAGNOSIS — Z923 Personal history of irradiation: Secondary | ICD-10-CM | POA: Diagnosis not present

## 2021-02-24 ENCOUNTER — Ambulatory Visit: Payer: BC Managed Care – PPO

## 2021-02-24 ENCOUNTER — Ambulatory Visit: Payer: BC Managed Care – PPO | Admitting: Internal Medicine

## 2021-02-24 ENCOUNTER — Other Ambulatory Visit: Payer: BC Managed Care – PPO

## 2021-03-07 ENCOUNTER — Other Ambulatory Visit: Payer: Self-pay | Admitting: Radiation Therapy

## 2021-03-07 ENCOUNTER — Ambulatory Visit
Admission: RE | Admit: 2021-03-07 | Discharge: 2021-03-07 | Disposition: A | Discharge status: Home / Self Care | Payer: Self-pay | Source: Ambulatory Visit | Attending: Internal Medicine | Admitting: Internal Medicine

## 2021-03-07 DIAGNOSIS — C7931 Secondary malignant neoplasm of brain: Secondary | ICD-10-CM

## 2021-03-14 ENCOUNTER — Encounter: Payer: Self-pay | Admitting: Internal Medicine

## 2021-03-18 DIAGNOSIS — F419 Anxiety disorder, unspecified: Secondary | ICD-10-CM | POA: Diagnosis not present

## 2021-03-21 ENCOUNTER — Other Ambulatory Visit: Payer: Self-pay

## 2021-03-21 ENCOUNTER — Inpatient Hospital Stay: Payer: BC Managed Care – PPO | Attending: Physician Assistant

## 2021-03-21 ENCOUNTER — Ambulatory Visit (HOSPITAL_COMMUNITY)
Admission: RE | Admit: 2021-03-21 | Discharge: 2021-03-21 | Disposition: A | Discharge status: Home / Self Care | Payer: BC Managed Care – PPO | Source: Ambulatory Visit | Attending: Internal Medicine | Admitting: Internal Medicine

## 2021-03-21 DIAGNOSIS — Z79899 Other long term (current) drug therapy: Secondary | ICD-10-CM | POA: Insufficient documentation

## 2021-03-21 DIAGNOSIS — C3432 Malignant neoplasm of lower lobe, left bronchus or lung: Secondary | ICD-10-CM | POA: Diagnosis not present

## 2021-03-21 DIAGNOSIS — C439 Malignant melanoma of skin, unspecified: Secondary | ICD-10-CM | POA: Insufficient documentation

## 2021-03-21 DIAGNOSIS — R911 Solitary pulmonary nodule: Secondary | ICD-10-CM | POA: Diagnosis not present

## 2021-03-21 DIAGNOSIS — C7802 Secondary malignant neoplasm of left lung: Secondary | ICD-10-CM | POA: Diagnosis not present

## 2021-03-21 DIAGNOSIS — K429 Umbilical hernia without obstruction or gangrene: Secondary | ICD-10-CM | POA: Diagnosis not present

## 2021-03-21 DIAGNOSIS — S2242XA Multiple fractures of ribs, left side, initial encounter for closed fracture: Secondary | ICD-10-CM | POA: Diagnosis not present

## 2021-03-21 LAB — CMP (CANCER CENTER ONLY)
ALT: 33 U/L (ref 0–44)
AST: 39 U/L (ref 15–41)
Albumin: 4.3 g/dL (ref 3.5–5.0)
Alkaline Phosphatase: 82 U/L (ref 38–126)
Anion gap: 11 (ref 5–15)
BUN: 20 mg/dL (ref 6–20)
CO2: 27 mmol/L (ref 22–32)
Calcium: 8.9 mg/dL (ref 8.9–10.3)
Chloride: 104 mmol/L (ref 98–111)
Creatinine: 1.04 mg/dL — ABNORMAL HIGH (ref 0.44–1.00)
GFR, Estimated: >60 mL/min (ref >60)
Glucose, Bld: 83 mg/dL (ref 70–99)
Potassium: 4.4 mmol/L (ref 3.5–5.1)
Sodium: 142 mmol/L (ref 135–145)
Total Bilirubin: 0.2 mg/dL — ABNORMAL LOW (ref 0.3–1.2)
Total Protein: 7.1 g/dL (ref 6.5–8.1)

## 2021-03-21 LAB — CBC WITH DIFFERENTIAL (CANCER CENTER ONLY)
Abs Immature Granulocytes: 0.01 10*3/uL (ref 0.00–0.07)
Basophils Absolute: 0 10*3/uL (ref 0.0–0.1)
Basophils Relative: 1 %
Eosinophils Absolute: 0.1 10*3/uL (ref 0.0–0.5)
Eosinophils Relative: 1 %
HCT: 42.5 % (ref 36.0–46.0)
Hemoglobin: 14.2 g/dL (ref 12.0–15.0)
Immature Granulocytes: 0 %
Lymphocytes Relative: 21 %
Lymphs Abs: 1.3 10*3/uL (ref 0.7–4.0)
MCH: 31 pg (ref 26.0–34.0)
MCHC: 33.4 g/dL (ref 30.0–36.0)
MCV: 92.8 fL (ref 80.0–100.0)
Monocytes Absolute: 0.7 10*3/uL (ref 0.1–1.0)
Monocytes Relative: 11 %
Neutro Abs: 4 10*3/uL (ref 1.7–7.7)
Neutrophils Relative %: 66 %
Platelet Count: 306 10*3/uL (ref 150–400)
RBC: 4.58 MIL/uL (ref 3.87–5.11)
RDW: 12.6 % (ref 11.5–15.5)
WBC Count: 6 10*3/uL (ref 4.0–10.5)
nRBC: 0 % (ref 0.0–0.2)

## 2021-03-21 MED ORDER — IOHEXOL 300 MG/ML  SOLN
100.0000 mL | Freq: Once | INTRAMUSCULAR | Status: AC | PRN
  Administered 2021-03-21: 100 mL via INTRAVENOUS

## 2021-03-24 DIAGNOSIS — M9903 Segmental and somatic dysfunction of lumbar region: Secondary | ICD-10-CM | POA: Diagnosis not present

## 2021-03-24 DIAGNOSIS — M2560 Stiffness of unspecified joint, not elsewhere classified: Secondary | ICD-10-CM | POA: Diagnosis not present

## 2021-03-24 DIAGNOSIS — M7632 Iliotibial band syndrome, left leg: Secondary | ICD-10-CM | POA: Diagnosis not present

## 2021-03-25 DIAGNOSIS — L738 Other specified follicular disorders: Secondary | ICD-10-CM | POA: Diagnosis not present

## 2021-03-25 DIAGNOSIS — D225 Melanocytic nevi of trunk: Secondary | ICD-10-CM | POA: Diagnosis not present

## 2021-03-25 DIAGNOSIS — L821 Other seborrheic keratosis: Secondary | ICD-10-CM | POA: Diagnosis not present

## 2021-03-25 DIAGNOSIS — L578 Other skin changes due to chronic exposure to nonionizing radiation: Secondary | ICD-10-CM | POA: Diagnosis not present

## 2021-03-26 ENCOUNTER — Telehealth: Payer: Self-pay | Admitting: Internal Medicine

## 2021-03-26 NOTE — Telephone Encounter (Signed)
Scheduled appt per 4/13 sch msg. Called pt, no answer. Left msg with appt date and time.

## 2021-03-27 ENCOUNTER — Other Ambulatory Visit: Payer: Self-pay

## 2021-03-27 ENCOUNTER — Other Ambulatory Visit: Payer: Self-pay | Admitting: Medical Oncology

## 2021-03-27 ENCOUNTER — Inpatient Hospital Stay: Payer: BC Managed Care – PPO

## 2021-03-27 ENCOUNTER — Inpatient Hospital Stay (HOSPITAL_BASED_OUTPATIENT_CLINIC_OR_DEPARTMENT_OTHER): Payer: BC Managed Care – PPO | Admitting: Internal Medicine

## 2021-03-27 ENCOUNTER — Encounter: Payer: Self-pay | Admitting: Internal Medicine

## 2021-03-27 VITALS — BP 107/70 | HR 74 | Temp 97.0°F | Resp 18 | Ht 69.0 in | Wt 151.0 lb

## 2021-03-27 DIAGNOSIS — C7931 Secondary malignant neoplasm of brain: Secondary | ICD-10-CM | POA: Diagnosis not present

## 2021-03-27 DIAGNOSIS — C439 Malignant melanoma of skin, unspecified: Secondary | ICD-10-CM | POA: Diagnosis not present

## 2021-03-27 DIAGNOSIS — C7802 Secondary malignant neoplasm of left lung: Secondary | ICD-10-CM

## 2021-03-27 DIAGNOSIS — Z79899 Other long term (current) drug therapy: Secondary | ICD-10-CM | POA: Diagnosis not present

## 2021-03-27 LAB — CMP (CANCER CENTER ONLY)
ALT: 29 U/L (ref 0–44)
AST: 36 U/L (ref 15–41)
Albumin: 4.2 g/dL (ref 3.5–5.0)
Alkaline Phosphatase: 79 U/L (ref 38–126)
Anion gap: 10 (ref 5–15)
BUN: 19 mg/dL (ref 6–20)
CO2: 26 mmol/L (ref 22–32)
Calcium: 9.3 mg/dL (ref 8.9–10.3)
Chloride: 105 mmol/L (ref 98–111)
Creatinine: 1.01 mg/dL — ABNORMAL HIGH (ref 0.44–1.00)
GFR, Estimated: >60 mL/min (ref >60)
Glucose, Bld: 74 mg/dL (ref 70–99)
Potassium: 5 mmol/L (ref 3.5–5.1)
Sodium: 141 mmol/L (ref 135–145)
Total Bilirubin: 0.4 mg/dL (ref 0.3–1.2)
Total Protein: 6.9 g/dL (ref 6.5–8.1)

## 2021-03-27 LAB — CBC WITH DIFFERENTIAL (CANCER CENTER ONLY)
Abs Immature Granulocytes: 0.02 10*3/uL (ref 0.00–0.07)
Basophils Absolute: 0 10*3/uL (ref 0.0–0.1)
Basophils Relative: 1 %
Eosinophils Absolute: 0.1 10*3/uL (ref 0.0–0.5)
Eosinophils Relative: 1 %
HCT: 43.3 % (ref 36.0–46.0)
Hemoglobin: 14.2 g/dL (ref 12.0–15.0)
Immature Granulocytes: 0 %
Lymphocytes Relative: 19 %
Lymphs Abs: 1.1 10*3/uL (ref 0.7–4.0)
MCH: 30.1 pg (ref 26.0–34.0)
MCHC: 32.8 g/dL (ref 30.0–36.0)
MCV: 91.9 fL (ref 80.0–100.0)
Monocytes Absolute: 0.7 10*3/uL (ref 0.1–1.0)
Monocytes Relative: 12 %
Neutro Abs: 3.8 10*3/uL (ref 1.7–7.7)
Neutrophils Relative %: 67 %
Platelet Count: 264 10*3/uL (ref 150–400)
RBC: 4.71 MIL/uL (ref 3.87–5.11)
RDW: 12.5 % (ref 11.5–15.5)
WBC Count: 5.7 10*3/uL (ref 4.0–10.5)
nRBC: 0 % (ref 0.0–0.2)

## 2021-03-27 NOTE — Progress Notes (Signed)
[facility] [phone]   [phone]  OFFICE PROGRESS NOTE  Isaac Bliss, Rayford Halsted, MD [place] [address]  DIAGNOSIS: Metastatic high-grade neoplasm consistent with metastatic malignant melanoma based on the most recent pathology report from [facility] in October 2019, presented with large left left upper/left lower lobe mass with a hypermetabolic AP window lymph node as well as solitary metastatic brain lesion diagnosed in May 2019.  Biomarker Findings Microsatellite status - MS-Stable Tumor Mutational Burden - TMB-Intermediate (16 Muts/Mb) Genomic Findings For a complete list of the genes assayed, please refer to the Appendix. CD274 (PD-L1) amplification NRAS Q61R PDCD1LG2 (PD-L2) amplification MYC amplification EPHB1 amplification JAK2 amplification RB1 Q93*, G626* TERT promoter -146C>T TP53 C275W   PRIOR THERAPY: 1) left temporal craniotomy with resection of tumor with intraoperative stereotactic guidance for volumetric resection under the care of Dr. Annette Stable on 04/29/2018. 2) First-line treatment with immunotherapy with Keytruda 200 mg IV every 3 weeks.  First dose June 02, 2018.  Status post 3 cycles.  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 3) palliative radiotherapy to the left lung mass under the care of Dr. Lisbeth Renshaw completed on 08/24/2018. 4) Resuming her treatment again with Keytruda 200 mg IV every 3 weeks, first dose 09/08/2018.  Status post 2 cycles.  CURRENT THERAPY: Treatment with immunotherapy with ipilimumab 3 mg/KG and nivolumab 1 mg/KG every 3 weeks for the first 4 cycles followed by maintenance nivolumab 240 mg IV every 2 weeks.  First dose of this treatment October 26, 2018.  Status post 40 cycles.  Ipilimumab was discontinued after cycle #2 for the significant liver dysfunction.  Starting from cycle #3 the patient is on treatment with single  agent nivolumab. Starting from cycle #39 the patient is switching to nivolumab 480 mg IV every 4 weeks.  Her treatment is currently on hold since December 02, 2020.  INTERVAL HISTORY: Shaquayla Klimas 45 y.o. female returns to the clinic today for follow-up visit.  The patient is feeling fine today with no concerning complaints.  She denied having any current chest pain, shortness of breath, cough or hemoptysis.  She denied having any fever or chills.  She has no nausea, vomiting, diarrhea or constipation.  She denied having any headache or visual changes.  She has no weight loss or night sweats.  She is currently on observation.  She had repeat CT scan of the chest, abdomen pelvis performed recently and she is here for evaluation and discussion of her scan results.  MEDICAL HISTORY: Past Medical History:  Diagnosis Date  . 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 Migraines   . Yeast infection     ALLERGIES:  is allergic to diflucan [fluconazole].  MEDICATIONS:  Current Outpatient Medications  Medication Sig Dispense Refill  . AMERICAN GINSENG PO Take 1,500-3,000 mg by mouth daily. Depending on level of fatigue    . Amino Acids (L-CARNITINE) LIQD Take 2,000 mg by mouth daily. Liquid L-Carnitine 2000 mg    . Ascorbic Acid 500 MG/15ML LIQD Take 1,000 mg by mouth daily.    . Beta Glucan POWD 1 Dose by Does not apply route daily. Beta D Glucan 400 mg    . Calcium-Magnesium (CAL-MAG PO) Take 15 mLs by mouth daily. Marine Based Cal - Mag (500 mg Calcium / 250 mg Mag) 15 ml total    . Coenzyme Q10 (COQ-10) 100 MG CAPS Take 1  tablet by mouth daily.    Marland Kitchen CREATINE MONOHYDRATE PO Take 6 g by mouth daily. Creatine MonoHydrate 6 g (on training cycle)    . doxylamine, Sleep, (UNISOM) 25 MG tablet Take 12.5 mg by mouth at bedtime as needed for sleep.    . ferrous sulfate 324 MG TBEC Take 324 mg by mouth 2 (two) times daily.    . Glucosamine-Chondroit-Vit C-Mn (GLUCOSAMINE 1500  COMPLEX PO) Take 1 tablet by mouth daily. Vegan CMO Glucosamine    . Hyaluronic Acid-Vitamin C (HYALURONIC ACID PO) Take 100 mg by mouth daily. Liquid Hyaluronic Acid 100 mg    . Iron-Folic Acid-Vit X91 (IRON FORMULA PO) Take 1 Dose by mouth daily. Iron 50 mg (1/2 before workout and 1/2 afterwards - cycle 1 wk off and on)    . Lysine POWD 1,600 mg by Does not apply route daily. L-Lysine Powder 1600 mg    . Methylsulfonylmethane (MSM) 1000 MG CAPS Take 4 capsules by mouth daily. 4000 mg (1/2 before workout and 1/2 afterwards)    . Multiple Vitamin (MULTIVITAMIN) tablet Take 1 tablet by mouth daily.    . Multiple Vitamins-Minerals (MY-VITALIFE) CAPS Take by mouth.    . Multiple Vitamins-Minerals (ZINC PO) Take 1 tablet by mouth daily. Raw Zinc 30 mg (cycle every other week)    . nivolumab (OPDIVO) 40 MG/4ML SOLN chemo injection Opdivo 40 mg/4 mL intravenous solution  INFUSE 40 MG OVER 30 MINUTE(S) BY INTRAVENOUS ROUTE EVERY 2 WEEKS    . NON FORMULARY Take 1 Dose by mouth daily. Cordyceps 750 mg    . NON FORMULARY Take 1 Dose by mouth daily. Liver Herbal Supplement 675 mg    . NON FORMULARY Take 1 Dose by mouth daily. Reishi Mushroom 500 mg    . NON FORMULARY Take 1 Dose by mouth daily. Curcumin Phytosome (Bioperrine) 3000 mg    . NON FORMULARY Take 1 tablet by mouth daily. Artemisnin 200 mg    . NON FORMULARY Take 1 tablet by mouth daily. Wellness Herbal Tablet as needed (Elderberry, Ashwagandha, etc)    . NON FORMULARY Take 1 tablet by mouth daily. Zinc L Carnosine Complex 75/59 mg (after workout)    . Omega 3-6-9 Fatty Acids (OMEGA-3-6-9 PO) Take 2 capsules by mouth daily. Plant Based Omega 3-6-9 2 capsules = 1825 mg    . OVER THE COUNTER MEDICATION TK 5 MILLILITERS PO QID PRF MOUTH PAIN    . QUERCETIN PO Take 1 tablet by mouth daily. Quercetin 400 mg / Bromelain 85 mg    . RESVERATROL PO Take 1,200 mg by mouth daily. Trans Resveratrol    . SM Omega-3-6-9 Fatty Acids CAPS Take by mouth.    .  traMADol (ULTRAM) 50 MG tablet Take 1 tablet (50 mg total) by mouth every 6 (six) hours as needed. 30 tablet 0  . VITAMIN A PO Take 1 tablet by mouth daily. 1500 mcg (5000 IU)    . Vitamin D-Vitamin K (VITAMIN K2-VITAMIN D3 PO) Take 1 capsule by mouth daily. 5000 IU + 50 mcg    . vitamin E 180 MG (400 UNITS) capsule Take by mouth.    Marland Kitchen VITAMIN E PO Take 1 tablet by mouth daily. Vegan Vitamin E     Current Facility-Administered Medications  Medication Dose Route Frequency Provider Last Rate Last Admin  . acetaminophen (TYLENOL) tablet 650 mg  650 mg Oral Q6H PRN Elgie Collard, PA-C        SURGICAL HISTORY:  Past Surgical  History:  Procedure Laterality Date  . APPLICATION OF CRANIAL NAVIGATION Left 04/29/2018   Procedure: APPLICATION OF CRANIAL NAVIGATION;  Surgeon: Earnie Larsson, MD;  Location: [facility];  Service: Neurosurgery;  Laterality: Left;  . CRANIOTOMY Left 04/29/2018   Procedure: LEFT CRANIOTOMY FOR  TUMOR BRAIN LAB;  Surgeon: Earnie Larsson, MD;  Location: [facility];  Service: Neurosurgery;  Laterality: Left;    REVIEW OF SYSTEMS:  A comprehensive review of systems was negative.   PHYSICAL EXAMINATION: General appearance: alert, cooperative and no distress Head: Normocephalic, without obvious abnormality, atraumatic Neck: no adenopathy, no JVD, supple, symmetrical, trachea midline and thyroid not enlarged, symmetric, no tenderness/mass/nodules Lymph nodes: Cervical, supraclavicular, and axillary nodes normal. Resp: clear to auscultation bilaterally Back: negative, symmetric, no curvature. ROM normal. No CVA tenderness. Cardio: regular rate and rhythm, S1, S2 normal, no murmur, click, rub or gallop GI: soft, non-tender; bowel sounds normal; no masses,  no organomegaly Extremities: extremities normal, atraumatic, no cyanosis or edema  ECOG PERFORMANCE STATUS: 0 - Asymptomatic  Blood pressure 107/70, pulse 74, temperature (!) 97 F (36.1 C), temperature source Tympanic, resp. rate 18,  height _0  (1.753 m), weight 151 lb (68.5 kg), last menstrual period 03/15/2021, SpO2 99 %.  LABORATORY DATA: Lab Results  Component Value Date   WBC 5.7 03/27/2021   HGB 14.2 03/27/2021   HCT 43.3 03/27/2021   MCV 91.9 03/27/2021   PLT 264 03/27/2021      Chemistry      Component Value Date/Time   NA 142 03/21/2021 1358   K 4.4 03/21/2021 1358   CL 104 03/21/2021 1358   CO2 27 03/21/2021 1358   BUN 20 03/21/2021 1358   CREATININE 1.04 (H) 03/21/2021 1358      Component Value Date/Time   CALCIUM 8.9 03/21/2021 1358   ALKPHOS 82 03/21/2021 1358   AST 39 03/21/2021 1358   ALT 33 03/21/2021 1358   BILITOT 0.2 (L) 03/21/2021 1358       RADIOGRAPHIC STUDIES: CT Chest W Contrast  Result Date: 03/24/2021 CLINICAL DATA:  Metastatic malignant melanoma diagnosed in May 2019. Immunotherapy completed 5 months ago. EXAM: CT CHEST, ABDOMEN, AND PELVIS WITH CONTRAST TECHNIQUE: Multidetector CT imaging of the chest, abdomen and pelvis was performed following the standard protocol during bolus administration of intravenous contrast. CONTRAST:  117m OMNIPAQUE IOHEXOL 300 MG/ML  SOLN COMPARISON:  PET-CT 12/26/2020. CTs of the chest, abdomen and pelvis 01/05/2020. FINDINGS: CT CHEST FINDINGS Cardiovascular: No significant vascular findings. The heart size is normal. There is no pericardial effusion. Mediastinum/Nodes: There are no enlarged mediastinal, hilar or axillary lymph nodes. Small amount of residual thymic tissue or mild thymic rebound unchanged. The thyroid gland, esophagus and trachea demonstrate no significant findings. Lungs/Pleura: No pleural effusion or pneumothorax. Interval decreased size of previously demonstrated low-density mass along the left heart border, now measuring 4.2 x 1.7 cm on image 45/2 (previously 4.7 x 2.5 cm). Similar adjacent volume loss and radiation changes in the lingula and left lower lobe. Stable probably benign left upper lobe nodule measuring 3 mm on image  43/4. No new or enlarging nodules. Musculoskeletal/Chest wall: Fracture of the left 8th rib laterally is again noted without apparent associated soft tissue mass or definite pathologic features. No other suspicious osseous findings. CT ABDOMEN AND PELVIS FINDINGS Hepatobiliary: The liver is normal in density without suspicious focal abnormality. No evidence of gallstones, gallbladder wall thickening or biliary dilatation. Pancreas: Unremarkable. No pancreatic ductal dilatation or surrounding inflammatory changes. Spleen: Normal in  size without focal abnormality. Adrenals/Urinary Tract: Both adrenal glands appear normal. The kidneys appear normal without evidence of urinary tract calculus, suspicious lesion or hydronephrosis. No bladder abnormalities are seen. Stomach/Bowel: Enteric contrast was administered and has passed into the distal small bowel. The stomach appears unremarkable for its degree of distension. No evidence of bowel wall thickening, distention or surrounding inflammatory change. Moderate stool throughout the colon. Vascular/Lymphatic: There are no enlarged abdominal or pelvic lymph nodes. No significant vascular findings. Reproductive: The uterus and ovaries appear normal. No adnexal mass. Other: Small umbilical hernia containing only fat. No ascites or peritoneal nodularity. Musculoskeletal: No acute or significant osseous findings. Stable probable bone islands in the pelvis. IMPRESSION: 1. Interval decreased size of low-density lesion along the left heart border. This was not hypermetabolic on previous PET-CT. 2. Stable adjacent radiation changes in the lingula and left lower lobe. Stable small left upper lobe pulmonary nodule, likely benign. 3. No evidence of metastatic disease. 4. Interval resolution of pneumothorax seen on interval PET-CT. Underlying fracture of the left 8th rib has not completely healed, but demonstrates no definite pathologic features. Electronically Signed   By: Richardean Sale M.D.   On: 03/24/2021 12:47   CT Abdomen Pelvis W Contrast  Result Date: 03/24/2021 CLINICAL DATA:  Metastatic malignant melanoma diagnosed in May 2019. Immunotherapy completed 5 months ago. EXAM: CT CHEST, ABDOMEN, AND PELVIS WITH CONTRAST TECHNIQUE: Multidetector CT imaging of the chest, abdomen and pelvis was performed following the standard protocol during bolus administration of intravenous contrast. CONTRAST:  161m OMNIPAQUE IOHEXOL 300 MG/ML  SOLN COMPARISON:  PET-CT 12/26/2020. CTs of the chest, abdomen and pelvis 01/05/2020. FINDINGS: CT CHEST FINDINGS Cardiovascular: No significant vascular findings. The heart size is normal. There is no pericardial effusion. Mediastinum/Nodes: There are no enlarged mediastinal, hilar or axillary lymph nodes. Small amount of residual thymic tissue or mild thymic rebound unchanged. The thyroid gland, esophagus and trachea demonstrate no significant findings. Lungs/Pleura: No pleural effusion or pneumothorax. Interval decreased size of previously demonstrated low-density mass along the left heart border, now measuring 4.2 x 1.7 cm on image 45/2 (previously 4.7 x 2.5 cm). Similar adjacent volume loss and radiation changes in the lingula and left lower lobe. Stable probably benign left upper lobe nodule measuring 3 mm on image 43/4. No new or enlarging nodules. Musculoskeletal/Chest wall: Fracture of the left 8th rib laterally is again noted without apparent associated soft tissue mass or definite pathologic features. No other suspicious osseous findings. CT ABDOMEN AND PELVIS FINDINGS Hepatobiliary: The liver is normal in density without suspicious focal abnormality. No evidence of gallstones, gallbladder wall thickening or biliary dilatation. Pancreas: Unremarkable. No pancreatic ductal dilatation or surrounding inflammatory changes. Spleen: Normal in size without focal abnormality. Adrenals/Urinary Tract: Both adrenal glands appear normal. The kidneys appear  normal without evidence of urinary tract calculus, suspicious lesion or hydronephrosis. No bladder abnormalities are seen. Stomach/Bowel: Enteric contrast was administered and has passed into the distal small bowel. The stomach appears unremarkable for its degree of distension. No evidence of bowel wall thickening, distention or surrounding inflammatory change. Moderate stool throughout the colon. Vascular/Lymphatic: There are no enlarged abdominal or pelvic lymph nodes. No significant vascular findings. Reproductive: The uterus and ovaries appear normal. No adnexal mass. Other: Small umbilical hernia containing only fat. No ascites or peritoneal nodularity. Musculoskeletal: No acute or significant osseous findings. Stable probable bone islands in the pelvis. IMPRESSION: 1. Interval decreased size of low-density lesion along the left heart border. This was not  hypermetabolic on previous PET-CT. 2. Stable adjacent radiation changes in the lingula and left lower lobe. Stable small left upper lobe pulmonary nodule, likely benign. 3. No evidence of metastatic disease. 4. Interval resolution of pneumothorax seen on interval PET-CT. Underlying fracture of the left 8th rib has not completely healed, but demonstrates no definite pathologic features. Electronically Signed   By: Richardean Sale M.D.   On: 03/24/2021 12:47    ASSESSMENT AND PLAN: This is a very pleasant 45 years old white female with highly suspicious metastatic malignant melanoma presented with large mass in the left upper/left lower lobe and mediastinal lymphadenopathy as well as solitary brain metastasis status post left temporal craniotomy and resection of tumor on 04/29/2018 and she is recovering well from her surgery. The patient completed stereotactic radiotherapy to the resection cavity next week under the care of Dr. Lisbeth Renshaw. The patient underwent treatment with immunotherapy with Keytruda 200 mg IV every 3 weeks status post 3 cycles.   Her scan  after cycle #3 showed enlargement of the left upper lobe lung mass.  This was also suspicious for pseudo-progression on immunotherapy.  The patient was started on a palliative course of radiotherapy to the left upper lobe lung mass and she tolerated this treatment fairly well.  She was seen by Dr. Emelda Brothers at [facility] and she recommended for the patient to resume her treatment with [facility] for now until she undergoes further molecular studies.  The patient was treated with 2 more cycles of Keytruda and tolerated the treatment well. Repeat CT scan of the chest, abdomen and pelvis at that time showed some improvement in the left upper lobe lung mass but there is still concern about pericardial invasion. She is still not a good candidate for surgical resection because of the pericardial invasion. The final pathology report and recommendation from [facility] was consistent with metastatic melanoma and the recommendation is to switch the patient to a combination immunotherapy with Ipilumumab and nivolumab. The patient underwent treatment with immunotherapy with ipilimumab and nivolumab status post 2 cycles.  Her treatment was on hold for more than 6 weeks secondary to grade 3 hepatic dysfunction.  She had improvement of her liver enzyme after a prolonged treatment with a steroid with a tapering schedule. She resumed her treatment again with single agent nivolumab.  She is status post 40 cycles of treatment. She continues to tolerate her treatment well with no concerning adverse effects. Starting from cycle #39 the patient will be treated with nivolumab 480 mg IV every 4 weeks. The patient has been tolerating this treatment well with no concerning adverse effects. She had a recent PET scan that showed no concerning findings for disease recurrence or progression. I discussed with the patient the options for treatment of her condition including continuous observation from now since  there is no evidence for any residual disease based on the PET scan. She is interested in this option and not to continue immunotherapy unless absolutely needed. The patient is currently on observation since her last dose on December 02, 2020. She is feeling fine today with no concerning complaints. She had repeat CT scan of the chest, abdomen pelvis performed recently. I personally and independently reviewed the scan images and discussed the result and showed the images to the patient today. Her scan showed no concerning findings for disease progression and there was further decrease in the size of the left lung mass. I recommended for her to continue on observation with repeat CT scan of  the chest, abdomen pelvis in 3 months. The patient was advised to call immediately if she has any concerning symptoms in the interval. The patient voices understanding of current disease status and treatment options and is in agreement with the current care plan. All questions were answered. The patient knows to call the clinic with any problems, questions or concerns. We can certainly see the patient much sooner if necessary.  Disclaimer: This note was dictated with voice recognition software. Similar sounding words can inadvertently be transcribed and may not be corrected upon review.

## 2021-03-31 ENCOUNTER — Ambulatory Visit (HOSPITAL_COMMUNITY): Payer: BC Managed Care – PPO

## 2021-04-02 ENCOUNTER — Encounter: Payer: Self-pay | Admitting: Internal Medicine

## 2021-04-09 ENCOUNTER — Telehealth: Payer: Self-pay | Admitting: Internal Medicine

## 2021-04-09 NOTE — Telephone Encounter (Signed)
fyi

## 2021-04-09 NOTE — Telephone Encounter (Signed)
FYI for the Doctor: Per [employer]  Rosanne Ashing [phone], they were not able to reach the patient for case management

## 2021-04-15 ENCOUNTER — Encounter: Payer: Self-pay | Admitting: Internal Medicine

## 2021-04-17 ENCOUNTER — Telehealth: Payer: Self-pay | Admitting: Internal Medicine

## 2021-04-17 NOTE — Telephone Encounter (Signed)
Per 4/14 los, Patient aware

## 2021-05-05 DIAGNOSIS — M9903 Segmental and somatic dysfunction of lumbar region: Secondary | ICD-10-CM | POA: Diagnosis not present

## 2021-05-05 DIAGNOSIS — M9902 Segmental and somatic dysfunction of thoracic region: Secondary | ICD-10-CM | POA: Diagnosis not present

## 2021-05-05 DIAGNOSIS — M9901 Segmental and somatic dysfunction of cervical region: Secondary | ICD-10-CM | POA: Diagnosis not present

## 2021-05-05 DIAGNOSIS — M2560 Stiffness of unspecified joint, not elsewhere classified: Secondary | ICD-10-CM | POA: Diagnosis not present

## 2021-05-09 ENCOUNTER — Encounter: Payer: Self-pay | Admitting: Internal Medicine

## 2021-05-13 DIAGNOSIS — F419 Anxiety disorder, unspecified: Secondary | ICD-10-CM | POA: Diagnosis not present

## 2021-06-18 DIAGNOSIS — C792 Secondary malignant neoplasm of skin: Secondary | ICD-10-CM | POA: Diagnosis not present

## 2021-06-18 DIAGNOSIS — Z6821 Body mass index (BMI) 21.0-21.9, adult: Secondary | ICD-10-CM | POA: Diagnosis not present

## 2021-06-18 DIAGNOSIS — Z9889 Other specified postprocedural states: Secondary | ICD-10-CM | POA: Diagnosis not present

## 2021-06-18 DIAGNOSIS — C7931 Secondary malignant neoplasm of brain: Secondary | ICD-10-CM | POA: Diagnosis not present

## 2021-06-18 DIAGNOSIS — C439 Malignant melanoma of skin, unspecified: Secondary | ICD-10-CM | POA: Diagnosis not present

## 2021-06-18 DIAGNOSIS — Z923 Personal history of irradiation: Secondary | ICD-10-CM | POA: Diagnosis not present

## 2021-06-19 DIAGNOSIS — M9901 Segmental and somatic dysfunction of cervical region: Secondary | ICD-10-CM | POA: Diagnosis not present

## 2021-06-19 DIAGNOSIS — M9902 Segmental and somatic dysfunction of thoracic region: Secondary | ICD-10-CM | POA: Diagnosis not present

## 2021-06-19 DIAGNOSIS — M2560 Stiffness of unspecified joint, not elsewhere classified: Secondary | ICD-10-CM | POA: Diagnosis not present

## 2021-06-19 DIAGNOSIS — M9903 Segmental and somatic dysfunction of lumbar region: Secondary | ICD-10-CM | POA: Diagnosis not present

## 2021-06-23 ENCOUNTER — Encounter: Payer: Self-pay | Admitting: Internal Medicine

## 2021-06-25 ENCOUNTER — Inpatient Hospital Stay: Payer: BC Managed Care – PPO | Attending: Physician Assistant

## 2021-06-25 ENCOUNTER — Ambulatory Visit (HOSPITAL_COMMUNITY)
Admission: RE | Admit: 2021-06-25 | Discharge: 2021-06-25 | Disposition: A | Discharge status: Home / Self Care | Payer: BC Managed Care – PPO | Source: Ambulatory Visit | Attending: Internal Medicine | Admitting: Internal Medicine

## 2021-06-25 ENCOUNTER — Other Ambulatory Visit: Payer: Self-pay

## 2021-06-25 DIAGNOSIS — C7931 Secondary malignant neoplasm of brain: Secondary | ICD-10-CM | POA: Insufficient documentation

## 2021-06-25 DIAGNOSIS — C7802 Secondary malignant neoplasm of left lung: Secondary | ICD-10-CM | POA: Insufficient documentation

## 2021-06-25 DIAGNOSIS — C439 Malignant melanoma of skin, unspecified: Secondary | ICD-10-CM | POA: Insufficient documentation

## 2021-06-25 DIAGNOSIS — Z923 Personal history of irradiation: Secondary | ICD-10-CM | POA: Diagnosis not present

## 2021-06-25 DIAGNOSIS — K429 Umbilical hernia without obstruction or gangrene: Secondary | ICD-10-CM | POA: Diagnosis not present

## 2021-06-25 DIAGNOSIS — R911 Solitary pulmonary nodule: Secondary | ICD-10-CM | POA: Diagnosis not present

## 2021-06-25 LAB — CMP (CANCER CENTER ONLY)
ALT: 26 U/L (ref 0–44)
AST: 32 U/L (ref 15–41)
Albumin: 4.1 g/dL (ref 3.5–5.0)
Alkaline Phosphatase: 66 U/L (ref 38–126)
Anion gap: 7 (ref 5–15)
BUN: 18 mg/dL (ref 6–20)
CO2: 27 mmol/L (ref 22–32)
Calcium: 9.7 mg/dL (ref 8.9–10.3)
Chloride: 106 mmol/L (ref 98–111)
Creatinine: 1.05 mg/dL — ABNORMAL HIGH (ref 0.44–1.00)
GFR, Estimated: >60 mL/min (ref >60)
Glucose, Bld: 86 mg/dL (ref 70–99)
Potassium: 4.8 mmol/L (ref 3.5–5.1)
Sodium: 140 mmol/L (ref 135–145)
Total Bilirubin: 0.3 mg/dL (ref 0.3–1.2)
Total Protein: 7.1 g/dL (ref 6.5–8.1)

## 2021-06-25 LAB — CBC WITH DIFFERENTIAL (CANCER CENTER ONLY)
Abs Immature Granulocytes: 0.01 10*3/uL (ref 0.00–0.07)
Basophils Absolute: 0 10*3/uL (ref 0.0–0.1)
Basophils Relative: 1 %
Eosinophils Absolute: 0.1 10*3/uL (ref 0.0–0.5)
Eosinophils Relative: 1 %
HCT: 40.8 % (ref 36.0–46.0)
Hemoglobin: 13.9 g/dL (ref 12.0–15.0)
Immature Granulocytes: 0 %
Lymphocytes Relative: 18 %
Lymphs Abs: 1 10*3/uL (ref 0.7–4.0)
MCH: 31.3 pg (ref 26.0–34.0)
MCHC: 34.1 g/dL (ref 30.0–36.0)
MCV: 91.9 fL (ref 80.0–100.0)
Monocytes Absolute: 0.5 10*3/uL (ref 0.1–1.0)
Monocytes Relative: 9 %
Neutro Abs: 3.9 10*3/uL (ref 1.7–7.7)
Neutrophils Relative %: 71 %
Platelet Count: 270 10*3/uL (ref 150–400)
RBC: 4.44 MIL/uL (ref 3.87–5.11)
RDW: 12.5 % (ref 11.5–15.5)
WBC Count: 5.6 10*3/uL (ref 4.0–10.5)
nRBC: 0 % (ref 0.0–0.2)

## 2021-06-25 MED ORDER — IOHEXOL 350 MG/ML SOLN
100.0000 mL | Freq: Once | INTRAVENOUS | Status: AC | PRN
  Administered 2021-06-25: 100 mL via INTRAVENOUS

## 2021-06-25 MED ORDER — SODIUM CHLORIDE (PF) 0.9 % IJ SOLN
INTRAMUSCULAR | Status: AC
  Filled 2021-06-25: qty 50

## 2021-06-26 ENCOUNTER — Inpatient Hospital Stay (HOSPITAL_BASED_OUTPATIENT_CLINIC_OR_DEPARTMENT_OTHER): Payer: BC Managed Care – PPO | Admitting: Internal Medicine

## 2021-06-26 ENCOUNTER — Other Ambulatory Visit: Payer: Self-pay

## 2021-06-26 VITALS — BP 119/68 | HR 70 | Temp 98.7°F | Resp 16 | Ht 69.0 in | Wt 147.4 lb

## 2021-06-26 DIAGNOSIS — Z923 Personal history of irradiation: Secondary | ICD-10-CM | POA: Diagnosis not present

## 2021-06-26 DIAGNOSIS — C439 Malignant melanoma of skin, unspecified: Secondary | ICD-10-CM | POA: Diagnosis not present

## 2021-06-26 DIAGNOSIS — C7802 Secondary malignant neoplasm of left lung: Secondary | ICD-10-CM | POA: Diagnosis not present

## 2021-06-26 DIAGNOSIS — C7931 Secondary malignant neoplasm of brain: Secondary | ICD-10-CM | POA: Diagnosis not present

## 2021-06-26 NOTE — Progress Notes (Signed)
[place] [phone]   [phone]  OFFICE PROGRESS NOTE  Isaac Bliss, Rayford Halsted, MD [place] [address]  DIAGNOSIS: Metastatic high-grade neoplasm consistent with metastatic malignant melanoma based on the most recent pathology report from [facility] in October 2019, presented with large left left upper/left lower lobe mass with a hypermetabolic AP window lymph node as well as solitary metastatic brain lesion diagnosed in May 2019.  Biomarker Findings Microsatellite status - MS-Stable Tumor Mutational Burden - TMB-Intermediate (16 Muts/Mb) Genomic Findings For a complete list of the genes assayed, please refer to the Appendix. CD274 (PD-L1) amplification NRAS Q61R PDCD1LG2 (PD-L2) amplification MYC amplification EPHB1 amplification JAK2 amplification RB1 Q93*, P915* TERT promoter -146C>T TP53 [id number]   PRIOR THERAPY: 1) left temporal craniotomy with resection of tumor with intraoperative stereotactic guidance for volumetric resection under the care of Dr. Annette Stable on 04/29/2018. 2) First-line treatment with immunotherapy with Keytruda 200 mg IV every 3 weeks.  First dose June 02, 2018.  Status post 3 cycles.  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 3) palliative radiotherapy to the left lung mass under the care of Dr. Lisbeth Renshaw completed on 08/24/2018. 4) Resuming her treatment again with Keytruda 200 mg IV every 3 weeks, first dose 09/08/2018.  Status post 2 cycles.  CURRENT THERAPY: Treatment with immunotherapy with ipilimumab 3 mg/KG and nivolumab 1 mg/KG every 3 weeks for the first 4 cycles followed by maintenance nivolumab 240 mg IV every 2 weeks.  First dose of this treatment October 26, 2018.  Status post 40 cycles.  Ipilimumab was discontinued after cycle #2 for the significant liver dysfunction.  Starting from cycle #3 the patient is on treatment with single  agent nivolumab. Starting from cycle #39 the patient is switching to nivolumab 480 mg IV every 4 weeks.  Her treatment is currently on hold since December 02, 2020.  INTERVAL HISTORY: Clydene Burack 45 y.o. female returns to the clinic today for follow-up visit.  The patient is feeling fine today with no concerning complaints.  She had an episode of bronchitis several weeks ago with severe cough.  She denied having any chest pain, shortness of breath, current cough or hemoptysis.  She denied having any fever or chills.  She has no nausea, vomiting, diarrhea or constipation.  She has no headache or visual changes.  She had MRI of the brain at [facility] that was unremarkable for any disease progression in the brain.  The patient had repeat CT scan of the chest, abdomen pelvis performed recently and she is here for evaluation and discussion of her scan results.  MEDICAL HISTORY: Past Medical History:  Diagnosis Date   met melanoma to brain and lung dx'd 04/2018   unknown origin   Migraines    Yeast infection     ALLERGIES:  is allergic to diflucan [fluconazole].  MEDICATIONS:  Current Outpatient Medications  Medication Sig Dispense Refill   AMERICAN GINSENG PO Take 1,500-3,000 mg by mouth daily. Depending on level of fatigue     Amino Acids (L-CARNITINE) LIQD Take 2,000 mg by mouth daily. Liquid L-Carnitine 2000 mg     Ascorbic Acid 500 MG/15ML LIQD Take 1,000 mg by mouth daily.     Beta Glucan POWD 1 Dose by Does not apply route daily. Beta D Glucan 400 mg     Calcium-Magnesium (CAL-MAG PO) Take 15 mLs by mouth daily. Marine Based Cal - Mag (500 mg Calcium /  250 mg Mag) 15 ml total     Coenzyme Q10 (COQ-10) 100 MG CAPS Take 1 tablet by mouth daily.     CREATINE MONOHYDRATE PO Take 6 g by mouth daily. Creatine MonoHydrate 6 g (on training cycle)     doxylamine, Sleep, (UNISOM) 25 MG tablet Take 12.5 mg by mouth at bedtime as needed for sleep.     ferrous sulfate 324 MG TBEC Take  324 mg by mouth 2 (two) times daily.     Glucosamine-Chondroit-Vit C-Mn (GLUCOSAMINE 1500 COMPLEX PO) Take 1 tablet by mouth daily. Vegan CMO Glucosamine     Hyaluronic Acid-Vitamin C (HYALURONIC ACID PO) Take 100 mg by mouth daily. Liquid Hyaluronic Acid 630 mg     Iron-Folic Acid-Vit Z60 (IRON FORMULA PO) Take 1 Dose by mouth daily. Iron 50 mg (1/2 before workout and 1/2 afterwards - cycle 1 wk off and on)     Lysine POWD 1,600 mg by Does not apply route daily. L-Lysine Powder 1600 mg     Methylsulfonylmethane (MSM) 1000 MG CAPS Take 4 capsules by mouth daily. 4000 mg (1/2 before workout and 1/2 afterwards)     Multiple Vitamin (MULTIVITAMIN) tablet Take 1 tablet by mouth daily.     Multiple Vitamins-Minerals (MY-VITALIFE) CAPS Take by mouth.     Multiple Vitamins-Minerals (ZINC PO) Take 1 tablet by mouth daily. Raw Zinc 30 mg (cycle every other week)     nivolumab (OPDIVO) 40 MG/4ML SOLN chemo injection Opdivo 40 mg/4 mL intravenous solution  INFUSE 40 MG OVER 30 MINUTE(S) BY INTRAVENOUS ROUTE EVERY 2 WEEKS     NON FORMULARY Take 1 Dose by mouth daily. Cordyceps 750 mg     NON FORMULARY Take 1 Dose by mouth daily. Liver Herbal Supplement 675 mg     NON FORMULARY Take 1 Dose by mouth daily. Reishi Mushroom 500 mg     NON FORMULARY Take 1 Dose by mouth daily. Curcumin Phytosome (Bioperrine) 3000 mg     NON FORMULARY Take 1 tablet by mouth daily. Artemisnin 200 mg     NON FORMULARY Take 1 tablet by mouth daily. Wellness Herbal Tablet as needed (Elderberry, Ashwagandha, etc)     NON FORMULARY Take 1 tablet by mouth daily. Zinc L Carnosine Complex 75/59 mg (after workout)     Omega 3-6-9 Fatty Acids (OMEGA-3-6-9 PO) Take 2 capsules by mouth daily. Plant Based Omega 3-6-9 2 capsules = 1825 mg     OVER THE COUNTER MEDICATION TK 5 MILLILITERS PO QID PRF MOUTH PAIN     QUERCETIN PO Take 1 tablet by mouth daily. Quercetin 400 mg / Bromelain 85 mg     RESVERATROL PO Take 1,200 mg by mouth daily. Trans  Resveratrol     SM Omega-3-6-9 Fatty Acids CAPS Take by mouth.     traMADol (ULTRAM) 50 MG tablet Take 1 tablet (50 mg total) by mouth every 6 (six) hours as needed. 30 tablet 0   VITAMIN A PO Take 1 tablet by mouth daily. 1500 mcg (5000 IU)     Vitamin D-Vitamin K (VITAMIN K2-VITAMIN D3 PO) Take 1 capsule by mouth daily. 5000 IU + 50 mcg     vitamin E 180 MG (400 UNITS) capsule Take by mouth.     VITAMIN E PO Take 1 tablet by mouth daily. Vegan Vitamin E     Current Facility-Administered Medications  Medication Dose Route Frequency Provider Last Rate Last Admin   acetaminophen (TYLENOL) tablet 650 mg  650 mg Oral  Q6H PRN Elgie Collard, PA-C        SURGICAL HISTORY:  Past Surgical History:  Procedure Laterality Date   APPLICATION OF CRANIAL NAVIGATION Left 04/29/2018   Procedure: APPLICATION OF CRANIAL NAVIGATION;  Surgeon: Earnie Larsson, MD;  Location: [facility];  Service: Neurosurgery;  Laterality: Left;   CRANIOTOMY Left 04/29/2018   Procedure: LEFT CRANIOTOMY FOR  TUMOR BRAIN LAB;  Surgeon: Earnie Larsson, MD;  Location: [facility];  Service: Neurosurgery;  Laterality: Left;    REVIEW OF SYSTEMS:  A comprehensive review of systems was negative.   PHYSICAL EXAMINATION: General appearance: alert, cooperative, and no distress Head: Normocephalic, without obvious abnormality, atraumatic Neck: no adenopathy, no JVD, supple, symmetrical, trachea midline, and thyroid not enlarged, symmetric, no tenderness/mass/nodules Lymph nodes: Cervical, supraclavicular, and axillary nodes normal. Resp: clear to auscultation bilaterally Back: negative, symmetric, no curvature. ROM normal. No CVA tenderness. Cardio: regular rate and rhythm, S1, S2 normal, no murmur, click, rub or gallop GI: soft, non-tender; bowel sounds normal; no masses,  no organomegaly Extremities: extremities normal, atraumatic, no cyanosis or edema  ECOG PERFORMANCE STATUS: 0 - Asymptomatic  Blood pressure 119/68, pulse 70, temperature 98.7  F (37.1 C), temperature source Oral, resp. rate 16, height 5' 9"  (1.753 m), weight 147 lb 6.4 oz (66.9 kg), last menstrual period 06/03/2021, SpO2 100 %.  LABORATORY DATA: Lab Results  Component Value Date   WBC 5.6 06/25/2021   HGB 13.9 06/25/2021   HCT 40.8 06/25/2021   MCV 91.9 06/25/2021   PLT 270 06/25/2021      Chemistry      Component Value Date/Time   NA 140 06/25/2021 1109   K 4.8 06/25/2021 1109   CL 106 06/25/2021 1109   CO2 27 06/25/2021 1109   BUN 18 06/25/2021 1109   CREATININE 1.05 (H) 06/25/2021 1109      Component Value Date/Time   CALCIUM 9.7 06/25/2021 1109   ALKPHOS 66 06/25/2021 1109   AST 32 06/25/2021 1109   ALT 26 06/25/2021 1109   BILITOT 0.3 06/25/2021 1109       RADIOGRAPHIC STUDIES: No results found.   ASSESSMENT AND PLAN: This is a very pleasant 45 years old white female with highly suspicious metastatic malignant melanoma presented with large mass in the left upper/left lower lobe and mediastinal lymphadenopathy as well as solitary brain metastasis status post left temporal craniotomy and resection of tumor on 04/29/2018 and she is recovering well from her surgery. The patient completed stereotactic radiotherapy to the resection cavity next week under the care of Dr. Lisbeth Renshaw. The patient underwent treatment with immunotherapy with Keytruda 200 mg IV every 3 weeks status post 3 cycles.   Her scan after cycle #3 showed enlargement of the left upper lobe lung mass.  This was also suspicious for pseudo-progression on immunotherapy.  The patient was started on a palliative course of radiotherapy to the left upper lobe lung mass and she tolerated this treatment fairly well.  She was seen by Dr. Emelda Brothers at [facility] and she recommended for the patient to resume her treatment with [facility] for now until she undergoes further molecular studies.  The patient was treated with 2 more cycles of Keytruda and tolerated the treatment well. Repeat CT  scan of the chest, abdomen and pelvis at that time showed some improvement in the left upper lobe lung mass but there is still concern about pericardial invasion. She is still not a good candidate for surgical resection because of the pericardial invasion.  The final pathology report and recommendation from [facility] was consistent with metastatic melanoma and the recommendation is to switch the patient to a combination immunotherapy with Ipilumumab and nivolumab. The patient underwent treatment with immunotherapy with ipilimumab and nivolumab status post 2 cycles.  Her treatment was on hold for more than 6 weeks secondary to grade 3 hepatic dysfunction.  She had improvement of her liver enzyme after a prolonged treatment with a steroid with a tapering schedule. She resumed her treatment again with single agent nivolumab.  She is status post 40 cycles of treatment. She continues to tolerate her treatment well with no concerning adverse effects. Starting from cycle #39 the patient will be treated with nivolumab 480 mg IV every 4 weeks. The patient has been tolerating this treatment well with no concerning adverse effects. She had a recent PET scan that showed no concerning findings for disease recurrence or progression. I discussed with the patient the options for treatment of her condition including continuous observation from now since there is no evidence for any residual disease based on the PET scan. She is interested in this option and not to continue immunotherapy unless absolutely needed. The patient is currently on observation since her last dose on December 02, 2020. The patient is doing fine today with no concerning complaints. She had MRI of the brain at [facility] that showed no evidence of disease progression in the brain. She also had repeat CT scan of the chest, abdomen pelvis performed yesterday.  I personally and independently reviewed the scan images and discussed  the results with the patient today. Her scan showed no concerning findings for disease recurrence or metastasis. I recommended for her to continue on observation with repeat CT scan of the chest, abdomen and pelvis in 3 months. She was advised to call immediately if she has any other concerning symptoms in the interval. The patient voices understanding of current disease status and treatment options and is in agreement with the current care plan. All questions were answered. The patient knows to call the clinic with any problems, questions or concerns. We can certainly see the patient much sooner if necessary.  Disclaimer: This note was dictated with voice recognition software. Similar sounding words can inadvertently be transcribed and may not be corrected upon review.

## 2021-07-01 ENCOUNTER — Telehealth: Payer: Self-pay | Admitting: Internal Medicine

## 2021-07-01 NOTE — Telephone Encounter (Signed)
Scheduled per los. Called and left msg. Mailed printout  °

## 2021-07-21 DIAGNOSIS — Z6821 Body mass index (BMI) 21.0-21.9, adult: Secondary | ICD-10-CM | POA: Diagnosis not present

## 2021-07-21 DIAGNOSIS — Z124 Encounter for screening for malignant neoplasm of cervix: Secondary | ICD-10-CM | POA: Diagnosis not present

## 2021-07-21 DIAGNOSIS — Z01419 Encounter for gynecological examination (general) (routine) without abnormal findings: Secondary | ICD-10-CM | POA: Diagnosis not present

## 2021-07-21 DIAGNOSIS — Z1231 Encounter for screening mammogram for malignant neoplasm of breast: Secondary | ICD-10-CM | POA: Diagnosis not present

## 2021-07-21 DIAGNOSIS — N926 Irregular menstruation, unspecified: Secondary | ICD-10-CM | POA: Diagnosis not present

## 2021-07-29 ENCOUNTER — Encounter: Payer: Self-pay | Admitting: Internal Medicine

## 2021-07-29 DIAGNOSIS — M9903 Segmental and somatic dysfunction of lumbar region: Secondary | ICD-10-CM | POA: Diagnosis not present

## 2021-07-29 DIAGNOSIS — M2560 Stiffness of unspecified joint, not elsewhere classified: Secondary | ICD-10-CM | POA: Diagnosis not present

## 2021-07-29 DIAGNOSIS — M9901 Segmental and somatic dysfunction of cervical region: Secondary | ICD-10-CM | POA: Diagnosis not present

## 2021-07-29 DIAGNOSIS — M9902 Segmental and somatic dysfunction of thoracic region: Secondary | ICD-10-CM | POA: Diagnosis not present

## 2021-07-30 DIAGNOSIS — M546 Pain in thoracic spine: Secondary | ICD-10-CM | POA: Diagnosis not present

## 2021-08-20 ENCOUNTER — Encounter: Payer: Self-pay | Admitting: Internal Medicine

## 2021-09-08 DIAGNOSIS — A6000 Herpesviral infection of urogenital system, unspecified: Secondary | ICD-10-CM | POA: Diagnosis not present

## 2021-09-09 DIAGNOSIS — M9902 Segmental and somatic dysfunction of thoracic region: Secondary | ICD-10-CM | POA: Diagnosis not present

## 2021-09-09 DIAGNOSIS — M2560 Stiffness of unspecified joint, not elsewhere classified: Secondary | ICD-10-CM | POA: Diagnosis not present

## 2021-09-09 DIAGNOSIS — M9903 Segmental and somatic dysfunction of lumbar region: Secondary | ICD-10-CM | POA: Diagnosis not present

## 2021-09-09 DIAGNOSIS — M9901 Segmental and somatic dysfunction of cervical region: Secondary | ICD-10-CM | POA: Diagnosis not present

## 2021-09-25 DIAGNOSIS — D225 Melanocytic nevi of trunk: Secondary | ICD-10-CM | POA: Diagnosis not present

## 2021-09-25 DIAGNOSIS — L299 Pruritus, unspecified: Secondary | ICD-10-CM | POA: Diagnosis not present

## 2021-09-25 DIAGNOSIS — L578 Other skin changes due to chronic exposure to nonionizing radiation: Secondary | ICD-10-CM | POA: Diagnosis not present

## 2021-09-25 DIAGNOSIS — L821 Other seborrheic keratosis: Secondary | ICD-10-CM | POA: Diagnosis not present

## 2021-09-26 ENCOUNTER — Ambulatory Visit (HOSPITAL_COMMUNITY)
Admission: RE | Admit: 2021-09-26 | Discharge: 2021-09-26 | Disposition: A | Discharge status: Home / Self Care | Payer: BC Managed Care – PPO | Source: Ambulatory Visit | Attending: Internal Medicine | Admitting: Internal Medicine

## 2021-09-26 ENCOUNTER — Other Ambulatory Visit: Payer: Self-pay

## 2021-09-26 ENCOUNTER — Inpatient Hospital Stay: Payer: BC Managed Care – PPO | Attending: Physician Assistant

## 2021-09-26 DIAGNOSIS — C7802 Secondary malignant neoplasm of left lung: Secondary | ICD-10-CM | POA: Insufficient documentation

## 2021-09-26 DIAGNOSIS — Z79899 Other long term (current) drug therapy: Secondary | ICD-10-CM | POA: Diagnosis not present

## 2021-09-26 DIAGNOSIS — C7931 Secondary malignant neoplasm of brain: Secondary | ICD-10-CM | POA: Insufficient documentation

## 2021-09-26 DIAGNOSIS — Z85820 Personal history of malignant melanoma of skin: Secondary | ICD-10-CM | POA: Diagnosis not present

## 2021-09-26 DIAGNOSIS — C439 Malignant melanoma of skin, unspecified: Secondary | ICD-10-CM | POA: Insufficient documentation

## 2021-09-26 DIAGNOSIS — Z923 Personal history of irradiation: Secondary | ICD-10-CM | POA: Diagnosis not present

## 2021-09-26 DIAGNOSIS — R911 Solitary pulmonary nodule: Secondary | ICD-10-CM | POA: Diagnosis not present

## 2021-09-26 LAB — CBC WITH DIFFERENTIAL (CANCER CENTER ONLY)
Abs Immature Granulocytes: 0.01 10*3/uL (ref 0.00–0.07)
Basophils Absolute: 0 10*3/uL (ref 0.0–0.1)
Basophils Relative: 1 %
Eosinophils Absolute: 0 10*3/uL (ref 0.0–0.5)
Eosinophils Relative: 1 %
HCT: 38.9 % (ref 36.0–46.0)
Hemoglobin: 13.6 g/dL (ref 12.0–15.0)
Immature Granulocytes: 0 %
Lymphocytes Relative: 13 %
Lymphs Abs: 0.8 10*3/uL (ref 0.7–4.0)
MCH: 31.6 pg (ref 26.0–34.0)
MCHC: 35 g/dL (ref 30.0–36.0)
MCV: 90.5 fL (ref 80.0–100.0)
Monocytes Absolute: 0.6 10*3/uL (ref 0.1–1.0)
Monocytes Relative: 10 %
Neutro Abs: 4.5 10*3/uL (ref 1.7–7.7)
Neutrophils Relative %: 75 %
Platelet Count: 235 10*3/uL (ref 150–400)
RBC: 4.3 MIL/uL (ref 3.87–5.11)
RDW: 12.7 % (ref 11.5–15.5)
WBC Count: 5.9 10*3/uL (ref 4.0–10.5)
nRBC: 0 % (ref 0.0–0.2)

## 2021-09-26 LAB — CMP (CANCER CENTER ONLY)
ALT: 28 U/L (ref 0–44)
AST: 36 U/L (ref 15–41)
Albumin: 4.5 g/dL (ref 3.5–5.0)
Alkaline Phosphatase: 60 U/L (ref 38–126)
Anion gap: 9 (ref 5–15)
BUN: 19 mg/dL (ref 6–20)
CO2: 24 mmol/L (ref 22–32)
Calcium: 9.2 mg/dL (ref 8.9–10.3)
Chloride: 105 mmol/L (ref 98–111)
Creatinine: 0.86 mg/dL (ref 0.44–1.00)
GFR, Estimated: >60 mL/min (ref >60)
Glucose, Bld: 86 mg/dL (ref 70–99)
Potassium: 4.1 mmol/L (ref 3.5–5.1)
Sodium: 138 mmol/L (ref 135–145)
Total Bilirubin: 0.6 mg/dL (ref 0.3–1.2)
Total Protein: 6.7 g/dL (ref 6.5–8.1)

## 2021-09-26 MED ORDER — IOHEXOL 350 MG/ML SOLN
80.0000 mL | Freq: Once | INTRAVENOUS | Status: AC | PRN
  Administered 2021-09-26: 80 mL via INTRAVENOUS

## 2021-09-29 ENCOUNTER — Inpatient Hospital Stay (HOSPITAL_BASED_OUTPATIENT_CLINIC_OR_DEPARTMENT_OTHER): Payer: BC Managed Care – PPO | Admitting: Internal Medicine

## 2021-09-29 ENCOUNTER — Other Ambulatory Visit: Payer: Self-pay

## 2021-09-29 VITALS — BP 119/80 | HR 77 | Temp 97.4°F | Resp 19 | Ht 69.0 in | Wt 149.3 lb

## 2021-09-29 DIAGNOSIS — Z85820 Personal history of malignant melanoma of skin: Secondary | ICD-10-CM | POA: Diagnosis not present

## 2021-09-29 DIAGNOSIS — Z923 Personal history of irradiation: Secondary | ICD-10-CM | POA: Diagnosis not present

## 2021-09-29 DIAGNOSIS — C7802 Secondary malignant neoplasm of left lung: Secondary | ICD-10-CM

## 2021-09-29 DIAGNOSIS — Z79899 Other long term (current) drug therapy: Secondary | ICD-10-CM | POA: Diagnosis not present

## 2021-09-29 DIAGNOSIS — C439 Malignant melanoma of skin, unspecified: Secondary | ICD-10-CM | POA: Diagnosis not present

## 2021-09-29 DIAGNOSIS — C7931 Secondary malignant neoplasm of brain: Secondary | ICD-10-CM | POA: Diagnosis not present

## 2021-09-29 NOTE — Progress Notes (Signed)
[place] Telephone:([phone]) [phone]   [phone]) [phone]  OFFICE PROGRESS NOTE  Isaac Bliss, Rayford Halsted, MD [place] [address]  DIAGNOSIS: Metastatic high-grade neoplasm consistent with metastatic malignant melanoma based on the most recent pathology report from [facility] in October 2019, presented with large left left upper/left lower lobe mass with a hypermetabolic AP window lymph node as well as solitary metastatic brain lesion diagnosed in May 2019.  Biomarker Findings Microsatellite status - MS-Stable Tumor Mutational Burden - TMB-Intermediate (16 Muts/Mb) Genomic Findings For a complete list of the genes assayed, please refer to the Appendix. CD274 (PD-L1) amplification NRAS Q61R PDCD1LG2 (PD-L2) amplification MYC amplification EPHB1 amplification JAK2 amplification RB1 Q93*, B357* TERT promoter -146C>T TP53 C275W   PRIOR THERAPY: 1) left temporal craniotomy with resection of tumor with intraoperative stereotactic guidance for volumetric resection under the care of Dr. Annette Stable on 04/29/2018. 2) First-line treatment with immunotherapy with Keytruda 200 mg IV every 3 weeks.  First dose June 02, 2018.  Status post 3 cycles.  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 3) palliative radiotherapy to the left lung mass under the care of Dr. Lisbeth Renshaw completed on 08/24/2018. 4) Resuming her treatment again with Keytruda 200 mg IV every 3 weeks, first dose 09/08/2018.  Status post 2 cycles.  CURRENT THERAPY: Treatment with immunotherapy with ipilimumab 3 mg/KG and nivolumab 1 mg/KG every 3 weeks for the first 4 cycles followed by maintenance nivolumab 240 mg IV every 2 weeks.  First dose of this treatment October 26, 2018.  Status post 40 cycles.  Ipilimumab was discontinued after cycle #2 for the significant liver dysfunction.  Starting from cycle #3 the patient is on treatment with single  agent nivolumab. Starting from cycle #39 the patient is switching to nivolumab 480 mg IV every 4 weeks.  Her treatment is currently on hold since December 02, 2020.  INTERVAL HISTORY: Leslie [name] 45 y.o. female returns to the clinic today for follow-up visit.  The patient is feeling fine today with no concerning complaints.  She continues to exercise and do weight lifting at regular basis.  She actually made it to the state level.  She denied having any current chest pain but has occasional flashing electrical sensation on the left side of the chest.  She denied having any shortness of breath, cough or hemoptysis.  She has no nausea, vomiting, diarrhea or constipation.  She denied having any headache or visual changes.  She had repeat CT scan of the chest, abdomen pelvis performed recently and she is here for evaluation and discussion of her risk her results.  MEDICAL HISTORY: Past Medical History:  Diagnosis Date   met melanoma to brain and lung dx'd 04/2018   unknown origin   Migraines    Yeast infection     ALLERGIES:  is allergic to diflucan [fluconazole].  MEDICATIONS:  Current Outpatient Medications  Medication Sig Dispense Refill   AMERICAN GINSENG PO Take 1,500-3,000 mg by mouth daily. Depending on level of fatigue     Amino Acids (L-CARNITINE) LIQD Take 2,000 mg by mouth daily. Liquid L-Carnitine 2000 mg     Ascorbic Acid 500 MG/15ML LIQD Take 1,000 mg by mouth daily.     Beta Glucan POWD 1 Dose by Does not apply route daily. Beta D Glucan 400 mg     Calcium-Magnesium (CAL-MAG PO) Take 15 mLs by mouth daily. Marine Based Liberty (500 mg Calcium / 250  mg Mag) 15 ml total     Coenzyme Q10 (COQ-10) 100 MG CAPS Take 1 tablet by mouth daily.     CREATINE MONOHYDRATE PO Take 6 g by mouth daily. Creatine MonoHydrate 6 g (on training cycle)     doxylamine, Sleep, (UNISOM) 25 MG tablet Take 12.5 mg by mouth at bedtime as needed for sleep.     ferrous sulfate 324 MG TBEC Take 324  mg by mouth 2 (two) times daily.     Glucosamine-Chondroit-Vit C-Mn (GLUCOSAMINE 1500 COMPLEX PO) Take 1 tablet by mouth daily. Vegan CMO Glucosamine     Hyaluronic Acid-Vitamin C (HYALURONIC ACID PO) Take 100 mg by mouth daily. Liquid Hyaluronic Acid 315 mg     Iron-Folic Acid-Vit V76 (IRON FORMULA PO) Take 1 Dose by mouth daily. Iron 50 mg (1/2 before workout and 1/2 afterwards - cycle 1 wk off and on)     Lysine POWD 1,600 mg by Does not apply route daily. L-Lysine Powder 1600 mg     Methylsulfonylmethane (MSM) 1000 MG CAPS Take 4 capsules by mouth daily. 4000 mg (1/2 before workout and 1/2 afterwards)     Multiple Vitamin (MULTIVITAMIN) tablet Take 1 tablet by mouth daily.     Multiple Vitamins-Minerals (MY-VITALIFE) CAPS Take by mouth.     Multiple Vitamins-Minerals (ZINC PO) Take 1 tablet by mouth daily. Raw Zinc 30 mg (cycle every other week)     nivolumab (OPDIVO) 40 MG/4ML SOLN chemo injection Opdivo 40 mg/4 mL intravenous solution  INFUSE 40 MG OVER 30 MINUTE(S) BY INTRAVENOUS ROUTE EVERY 2 WEEKS     NON FORMULARY Take 1 Dose by mouth daily. Cordyceps 750 mg     NON FORMULARY Take 1 Dose by mouth daily. Liver Herbal Supplement 675 mg     NON FORMULARY Take 1 Dose by mouth daily. Reishi Mushroom 500 mg     NON FORMULARY Take 1 Dose by mouth daily. Curcumin Phytosome (Bioperrine) 3000 mg     NON FORMULARY Take 1 tablet by mouth daily. Artemisnin 200 mg     NON FORMULARY Take 1 tablet by mouth daily. Wellness Herbal Tablet as needed (Elderberry, Ashwagandha, etc)     NON FORMULARY Take 1 tablet by mouth daily. Zinc L Carnosine Complex 75/59 mg (after workout)     Omega 3-6-9 Fatty Acids (OMEGA-3-6-9 PO) Take 2 capsules by mouth daily. Plant Based Omega 3-6-9 2 capsules = 1825 mg     OVER THE COUNTER MEDICATION TK 5 MILLILITERS PO QID PRF MOUTH PAIN     QUERCETIN PO Take 1 tablet by mouth daily. Quercetin 400 mg / Bromelain 85 mg     RESVERATROL PO Take 1,200 mg by mouth daily. Trans  Resveratrol     SM Omega-3-6-9 Fatty Acids CAPS Take by mouth.     traMADol (ULTRAM) 50 MG tablet Take 1 tablet (50 mg total) by mouth every 6 (six) hours as needed. 30 tablet 0   VITAMIN A PO Take 1 tablet by mouth daily. 1500 mcg (5000 IU)     Vitamin D-Vitamin K (VITAMIN K2-VITAMIN D3 PO) Take 1 capsule by mouth daily. 5000 IU + 50 mcg     vitamin E 180 MG (400 UNITS) capsule Take by mouth.     VITAMIN E PO Take 1 tablet by mouth daily. Vegan Vitamin E     Current Facility-Administered Medications  Medication Dose Route Frequency Provider Last Rate Last Admin   acetaminophen (TYLENOL) tablet 650 mg  650 mg Oral Q6H  PRN Elgie Collard, PA-C        SURGICAL HISTORY:  Past Surgical History:  Procedure Laterality Date   APPLICATION OF CRANIAL NAVIGATION Left 04/29/2018   Procedure: APPLICATION OF CRANIAL NAVIGATION;  Surgeon: Earnie Larsson, MD;  Location: [facility];  Service: Neurosurgery;  Laterality: Left;   CRANIOTOMY Left 04/29/2018   Procedure: LEFT CRANIOTOMY FOR  TUMOR BRAIN LAB;  Surgeon: Earnie Larsson, MD;  Location: [facility];  Service: Neurosurgery;  Laterality: Left;    REVIEW OF SYSTEMS:  A comprehensive review of systems was negative.   PHYSICAL EXAMINATION: General appearance: alert, cooperative, and no distress Head: Normocephalic, without obvious abnormality, atraumatic Neck: no adenopathy, no JVD, supple, symmetrical, trachea midline, and thyroid not enlarged, symmetric, no tenderness/mass/nodules Lymph nodes: Cervical, supraclavicular, and axillary nodes normal. Resp: clear to auscultation bilaterally Back: negative, symmetric, no curvature. ROM normal. No CVA tenderness. Cardio: regular rate and rhythm, S1, S2 normal, no murmur, click, rub or gallop GI: soft, non-tender; bowel sounds normal; no masses,  no organomegaly Extremities: extremities normal, atraumatic, no cyanosis or edema  ECOG PERFORMANCE STATUS: 0 - Asymptomatic  Blood pressure 119/80, pulse 77, temperature (!)  97.4 F (36.3 C), temperature source Tympanic, resp. rate 19, height _0  (1.753 m), weight 149 lb 4.8 oz (67.7 kg), SpO2 100 %.  LABORATORY DATA: Lab Results  Component Value Date   WBC 5.9 09/26/2021   HGB 13.6 09/26/2021   HCT 38.9 09/26/2021   MCV 90.5 09/26/2021   PLT 235 09/26/2021      Chemistry      Component Value Date/Time   NA 138 09/26/2021 1014   K 4.1 09/26/2021 1014   CL 105 09/26/2021 1014   CO2 24 09/26/2021 1014   BUN 19 09/26/2021 1014   CREATININE 0.86 09/26/2021 1014      Component Value Date/Time   CALCIUM 9.2 09/26/2021 1014   ALKPHOS 60 09/26/2021 1014   AST 36 09/26/2021 1014   ALT 28 09/26/2021 1014   BILITOT 0.6 09/26/2021 1014       RADIOGRAPHIC STUDIES: CT Chest W Contrast  Result Date: 09/28/2021 CLINICAL DATA:  History of malignant melanoma with brain and lung metastases, diagnosed in 2019 status post radiation and immunotherapy completed 9 months ago. EXAM: CT CHEST, ABDOMEN, AND PELVIS WITH CONTRAST TECHNIQUE: Multidetector CT imaging of the chest, abdomen and pelvis was performed following the standard protocol during bolus administration of intravenous contrast. CONTRAST:  8m OMNIPAQUE IOHEXOL 350 MG/ML SOLN COMPARISON:  Multiple priors including most recent CT June 25, 2021 FINDINGS: CT CHEST FINDINGS Cardiovascular: No significant vascular findings. Normal heart size. No pericardial effusion. Mediastinum/Nodes: No enlarged mediastinal, hilar, or axillary lymph nodes. Thyroid gland, trachea, and esophagus demonstrate no significant findings. Lungs/Pleura: Chronic areas of scarring in the medial aspect of the lingula and basal segment of the left lower lobe a. Stable in comparison to prior examinations. Similar appearance of the persistent area of low attenuation in the medial aspect of the inferior segment of the lingula on image 44/2 at the site of the treated metastases, currently measuring 3.8 by 1.7 cm, not significantly changed from  prior examinations. Unchanged size of the 3 mm ground-glass nodule in the left upper lobe on image 41/4. No new suspicious pulmonary nodules or masses. No pleural effusion. No pneumothorax. Musculoskeletal: No chest wall mass or suspicious bone lesions identified. CT ABDOMEN PELVIS FINDINGS Hepatobiliary: No focal liver abnormality is seen. No gallstones, gallbladder wall thickening, or biliary dilatation. Pancreas: Unremarkable. No  pancreatic ductal dilatation or surrounding inflammatory changes. Spleen: Normal in size without focal abnormality. Adrenals/Urinary Tract: Adrenal glands are unremarkable. Kidneys are normal, without renal calculi, solid enhancing lesion, or hydronephrosis. Bladder is unremarkable. Stomach/Bowel: Radiopaque enteric contrast traverses distal loops of small bowel. No pathologic dilation of small or large bowel. Normal appendix. No evidence of acute bowel inflammation. Vascular/Lymphatic: No abdominal aortic aneurysm. No pathologically enlarged abdominal or pelvic lymph nodes. Reproductive: Uterus and left adnexa are unremarkable. Simple appearing 1.7 cm dominant follicle in the right ovary. Other: Trace physiologic volume of pelvic free fluid. Musculoskeletal: No aggressive lytic or blastic lesion of bone. IMPRESSION: 1. Stable chronic post radiation fibrotic changes in the left lung base and stable low attenuation area in the medial aspect of the inferior segment of the lingula at the site of the treated metastases. 2. Unchanged size of the 3 mm ground-glass nodule in the left upper lobe, stable over multiple priors and favored benign. 3. No evidence of new or progressive disease in the chest abdomen or pelvis. Electronically Signed   By: Dahlia Bailiff M.D.   On: 09/28/2021 17:14   CT Abdomen Pelvis W Contrast  Result Date: 09/28/2021 CLINICAL DATA:  History of malignant melanoma with brain and lung metastases, diagnosed in 2019 status post radiation and immunotherapy completed 9  months ago. EXAM: CT CHEST, ABDOMEN, AND PELVIS WITH CONTRAST TECHNIQUE: Multidetector CT imaging of the chest, abdomen and pelvis was performed following the standard protocol during bolus administration of intravenous contrast. CONTRAST:  66m OMNIPAQUE IOHEXOL 350 MG/ML SOLN COMPARISON:  Multiple priors including most recent CT June 25, 2021 FINDINGS: CT CHEST FINDINGS Cardiovascular: No significant vascular findings. Normal heart size. No pericardial effusion. Mediastinum/Nodes: No enlarged mediastinal, hilar, or axillary lymph nodes. Thyroid gland, trachea, and esophagus demonstrate no significant findings. Lungs/Pleura: Chronic areas of scarring in the medial aspect of the lingula and basal segment of the left lower lobe a. Stable in comparison to prior examinations. Similar appearance of the persistent area of low attenuation in the medial aspect of the inferior segment of the lingula on image 44/2 at the site of the treated metastases, currently measuring 3.8 by 1.7 cm, not significantly changed from prior examinations. Unchanged size of the 3 mm ground-glass nodule in the left upper lobe on image 41/4. No new suspicious pulmonary nodules or masses. No pleural effusion. No pneumothorax. Musculoskeletal: No chest wall mass or suspicious bone lesions identified. CT ABDOMEN PELVIS FINDINGS Hepatobiliary: No focal liver abnormality is seen. No gallstones, gallbladder wall thickening, or biliary dilatation. Pancreas: Unremarkable. No pancreatic ductal dilatation or surrounding inflammatory changes. Spleen: Normal in size without focal abnormality. Adrenals/Urinary Tract: Adrenal glands are unremarkable. Kidneys are normal, without renal calculi, solid enhancing lesion, or hydronephrosis. Bladder is unremarkable. Stomach/Bowel: Radiopaque enteric contrast traverses distal loops of small bowel. No pathologic dilation of small or large bowel. Normal appendix. No evidence of acute bowel inflammation.  Vascular/Lymphatic: No abdominal aortic aneurysm. No pathologically enlarged abdominal or pelvic lymph nodes. Reproductive: Uterus and left adnexa are unremarkable. Simple appearing 1.7 cm dominant follicle in the right ovary. Other: Trace physiologic volume of pelvic free fluid. Musculoskeletal: No aggressive lytic or blastic lesion of bone. IMPRESSION: 1. Stable chronic post radiation fibrotic changes in the left lung base and stable low attenuation area in the medial aspect of the inferior segment of the lingula at the site of the treated metastases. 2. Unchanged size of the 3 mm ground-glass nodule in the left upper lobe, stable over multiple  priors and favored benign. 3. No evidence of new or progressive disease in the chest abdomen or pelvis. Electronically Signed   By: Dahlia Bailiff M.D.   On: 09/28/2021 17:14     ASSESSMENT AND PLAN: This is a very pleasant 45 years old white female with highly suspicious metastatic malignant melanoma presented with large mass in the left upper/left lower lobe and mediastinal lymphadenopathy as well as solitary brain metastasis status post left temporal craniotomy and resection of tumor on 04/29/2018 and she is recovering well from her surgery. The patient completed stereotactic radiotherapy to the resection cavity next week under the care of Dr. Lisbeth Renshaw. The patient underwent treatment with immunotherapy with Keytruda 200 mg IV every 3 weeks status post 3 cycles.   Her scan after cycle #3 showed enlargement of the left upper lobe lung mass.  This was also suspicious for pseudo-progression on immunotherapy.  The patient was started on a palliative course of radiotherapy to the left upper lobe lung mass and she tolerated this treatment fairly well.  She was seen by Dr. Emelda Brothers at [facility] and she recommended for the patient to resume her treatment with [facility] for now until she undergoes further molecular studies.  The patient was treated with 2 more cycles  of Keytruda and tolerated the treatment well. Repeat CT scan of the chest, abdomen and pelvis at that time showed some improvement in the left upper lobe lung mass but there is still concern about pericardial invasion. She is still not a good candidate for surgical resection because of the pericardial invasion. The final pathology report and recommendation from [facility] was consistent with metastatic melanoma and the recommendation is to switch the patient to a combination immunotherapy with Ipilumumab and nivolumab. The patient underwent treatment with immunotherapy with ipilimumab and nivolumab status post 2 cycles.  Her treatment was on hold for more than 6 weeks secondary to grade 3 hepatic dysfunction.  She had improvement of her liver enzyme after a prolonged treatment with a steroid with a tapering schedule. She resumed her treatment again with single agent nivolumab.  She is status post 40 cycles of treatment. She continues to tolerate her treatment well with no concerning adverse effects. Starting from cycle #39 the patient will be treated with nivolumab 480 mg IV every 4 weeks. The patient has been tolerating this treatment well with no concerning adverse effects. The last PET scan performed almost 10 months ago showed no evidence of residual disease. The patient is currently on observation since her last dose on December 02, 2020. She has been in observation since that time and she is feeling fine with no concerning complaints. She had repeat CT scan of the chest, abdomen pelvis performed recently.  I personally and independently reviewed the scans and discussed the results with the patient today. Her scan showed no concerning findings for disease recurrence or progression. I recommended for her to continue on observation with repeat CT scan of the chest, abdomen pelvis in 4 months.  The patient also has some interest in repeating a PET scan but definitely I will consider it  if she has any concerning findings on the CT scan of the chest, abdomen pelvis. She was advised to call immediately if she has any other concerning symptoms in the interval. The patient voices understanding of current disease status and treatment options and is in agreement with the current care plan. All questions were answered. The patient knows to call the clinic with any problems, questions  or concerns. We can certainly see the patient much sooner if necessary.  Disclaimer: This note was dictated with voice recognition software. Similar sounding words can inadvertently be transcribed and may not be corrected upon review.

## 2021-09-30 ENCOUNTER — Telehealth: Payer: Self-pay | Admitting: Internal Medicine

## 2021-09-30 NOTE — Telephone Encounter (Signed)
Scheduled follow-up appointments per 10/17 los. Patient is aware.

## 2021-10-21 DIAGNOSIS — M5386 Other specified dorsopathies, lumbar region: Secondary | ICD-10-CM | POA: Diagnosis not present

## 2021-10-21 DIAGNOSIS — M9901 Segmental and somatic dysfunction of cervical region: Secondary | ICD-10-CM | POA: Diagnosis not present

## 2021-10-21 DIAGNOSIS — M9903 Segmental and somatic dysfunction of lumbar region: Secondary | ICD-10-CM | POA: Diagnosis not present

## 2021-10-21 DIAGNOSIS — M5382 Other specified dorsopathies, cervical region: Secondary | ICD-10-CM | POA: Diagnosis not present

## 2021-11-24 ENCOUNTER — Encounter: Payer: Self-pay | Admitting: Internal Medicine

## 2021-11-26 ENCOUNTER — Other Ambulatory Visit: Payer: Self-pay

## 2021-11-26 ENCOUNTER — Telehealth (INDEPENDENT_AMBULATORY_CARE_PROVIDER_SITE_OTHER): Payer: BC Managed Care – PPO | Admitting: Family Medicine

## 2021-11-26 ENCOUNTER — Encounter: Payer: Self-pay | Admitting: Internal Medicine

## 2021-11-26 DIAGNOSIS — U071 COVID-19: Secondary | ICD-10-CM | POA: Diagnosis not present

## 2021-11-26 DIAGNOSIS — Z20822 Contact with and (suspected) exposure to covid-19: Secondary | ICD-10-CM | POA: Diagnosis not present

## 2021-11-26 NOTE — Progress Notes (Signed)
Patient ID: Leslie [name], female   DOB: 29-Aug-1976, 45 y.o.   MRN: [id number]  Virtual Visit via Telephone Note  I connected with Leslie [name] on 11/26/21 at  5:30 PM EST by telephone and verified that I am speaking with the correct person using two identifiers.   I discussed the limitations, risks, security and privacy concerns of performing an evaluation and management service by telephone and the availability of in person appointments. I also discussed with the patient that there may be a patient responsible charge related to this service. The patient expressed understanding and agreed to proceed.  Location patient: home Location provider: work or home office Participants present for the call: patient, provider Patient did not have a visit in the prior 7 days to address this/these issue(s).   History of Present Illness:  Leslie [name] relates positive COVID diagnosis.  She has history of melanoma metastatic to lung and finished therapy for that roughly a year ago.  She states that Sunday she felt little more fatigued than usual and did have some mild congestion nasally.  Home COVID test came back positive.  She really does not feel that poorly.  Has never had any fever.  No cough whatsoever.  No dyspnea.  In fact, she rode her exercise bike this morning.  Did not ride with the same intensity but seemed to tolerate that fairly well.  Lives with her husband and son and they do not have symptoms at this time.  Patient denies any nausea, vomiting, or diarrhea.  Past Medical History:  Diagnosis Date   met melanoma to brain and lung dx'd 04/2018   unknown origin   Migraines    Yeast infection    Past Surgical History:  Procedure Laterality Date   APPLICATION OF CRANIAL NAVIGATION Left 04/29/2018   Procedure: APPLICATION OF CRANIAL NAVIGATION;  Surgeon: Earnie Larsson, MD;  Location: [facility];  Service: Neurosurgery;  Laterality: Left;   CRANIOTOMY Left 04/29/2018   Procedure: LEFT  CRANIOTOMY FOR  TUMOR BRAIN LAB;  Surgeon: Earnie Larsson, MD;  Location: [place];  Service: Neurosurgery;  Laterality: Left;    reports that she has never smoked. She has never used smokeless tobacco. She reports current alcohol use. She reports that she does not use drugs. family history includes Cancer in her father. Allergies  Allergen Reactions   Diflucan [Fluconazole] Other (See Comments)    Wheezing and throat pressue      Observations/Objective: Patient sounds cheerful and well on the phone. I do not appreciate any SOB. Speech and thought processing are grossly intact. Patient reported vitals:  Assessment and Plan:  COVID-19.  Patient has very mild symptoms currently.  -We discussed antiviral therapy options.  She declines at this time.  She is aware there is a 5-day window for use of these if she changes her mind -Plenty of fluids and rest -She plans to work from home remainder of this week -Follow-up promptly for any increased dyspnea or other concerns  Follow Up Instructions:    99441 5-10 [id number] 11-20 99443 21-30 I did not refer this patient for an OV in the next 24 hours for this/these issue(s).  I discussed the assessment and treatment plan with the patient. The patient was provided an opportunity to ask questions and all were answered. The patient agreed with the plan and demonstrated an understanding of the instructions.   The patient was advised to call back or seek an in-person evaluation if the symptoms worsen or if the condition fails  to improve as anticipated.  I provided 13 minutes of non-face-to-face time during this encounter.   Carolann Littler, MD

## 2021-11-27 ENCOUNTER — Encounter: Payer: BC Managed Care – PPO | Admitting: Internal Medicine

## 2021-11-28 ENCOUNTER — Telehealth: Payer: Self-pay | Admitting: Internal Medicine

## 2021-11-28 NOTE — Telephone Encounter (Signed)
Spoke with the patient. She is aware of Dr. Erick Blinks message.

## 2021-11-28 NOTE — Telephone Encounter (Signed)
Left message for patient to call back  

## 2021-11-28 NOTE — Telephone Encounter (Signed)
Pt had virtual appt with dr Elease Hashimoto on 11-26-2021 and was told to call with update. Pt does not need paxloid and she is better . Pt would like to know when should she retest for covid

## 2021-12-04 ENCOUNTER — Encounter: Payer: Self-pay | Admitting: Internal Medicine

## 2021-12-04 ENCOUNTER — Ambulatory Visit (INDEPENDENT_AMBULATORY_CARE_PROVIDER_SITE_OTHER): Payer: BC Managed Care – PPO | Admitting: Internal Medicine

## 2021-12-04 VITALS — BP 110/80 | HR 75 | Temp 98.1°F | Ht 69.0 in | Wt 146.1 lb

## 2021-12-04 DIAGNOSIS — C7802 Secondary malignant neoplasm of left lung: Secondary | ICD-10-CM

## 2021-12-04 DIAGNOSIS — Z1211 Encounter for screening for malignant neoplasm of colon: Secondary | ICD-10-CM

## 2021-12-04 DIAGNOSIS — Z0000 Encounter for general adult medical examination without abnormal findings: Secondary | ICD-10-CM

## 2021-12-04 DIAGNOSIS — C7931 Secondary malignant neoplasm of brain: Secondary | ICD-10-CM

## 2021-12-04 LAB — CBC WITH DIFFERENTIAL/PLATELET
Basophils Absolute: 0 10*3/uL (ref 0.0–0.1)
Basophils Relative: 0.7 % (ref 0.0–3.0)
Eosinophils Absolute: 0.1 10*3/uL (ref 0.0–0.7)
Eosinophils Relative: 4.4 % (ref 0.0–5.0)
HCT: 42.1 % (ref 36.0–46.0)
Hemoglobin: 14.2 g/dL (ref 12.0–15.0)
Lymphocytes Relative: 32.1 % (ref 12.0–46.0)
Lymphs Abs: 0.9 10*3/uL (ref 0.7–4.0)
MCHC: 33.8 g/dL (ref 30.0–36.0)
MCV: 91.5 fl (ref 78.0–100.0)
Monocytes Absolute: 0.4 10*3/uL (ref 0.1–1.0)
Monocytes Relative: 13.5 % — ABNORMAL HIGH (ref 3.0–12.0)
Neutro Abs: 1.3 10*3/uL — ABNORMAL LOW (ref 1.4–7.7)
Neutrophils Relative %: 49.3 % (ref 43.0–77.0)
Platelets: 224 10*3/uL (ref 150.0–400.0)
RBC: 4.6 Mil/uL (ref 3.87–5.11)
RDW: 12.7 % (ref 11.5–15.5)
WBC: 2.6 10*3/uL — ABNORMAL LOW (ref 4.0–10.5)

## 2021-12-04 LAB — COMPREHENSIVE METABOLIC PANEL
ALT: 20 U/L (ref 0–35)
AST: 27 U/L (ref 0–37)
Albumin: 4.1 g/dL (ref 3.5–5.2)
Alkaline Phosphatase: 56 U/L (ref 39–117)
BUN: 13 mg/dL (ref 6–23)
CO2: 29 mEq/L (ref 19–32)
Calcium: 9.1 mg/dL (ref 8.4–10.5)
Chloride: 107 mEq/L (ref 96–112)
Creatinine, Ser: 0.95 mg/dL (ref 0.40–1.20)
GFR: 72.05 mL/min (ref >60.00)
Glucose, Bld: 88 mg/dL (ref 70–99)
Potassium: 4.3 mEq/L (ref 3.5–5.1)
Sodium: 139 mEq/L (ref 135–145)
Total Bilirubin: 0.5 mg/dL (ref 0.2–1.2)
Total Protein: 6.6 g/dL (ref 6.0–8.3)

## 2021-12-04 LAB — LIPID PANEL
Cholesterol: 143 mg/dL (ref 0–200)
HDL: 48.3 mg/dL (ref >39.00)
LDL Cholesterol: 79 mg/dL (ref 0–99)
NonHDL: 94.31
Total CHOL/HDL Ratio: 3
Triglycerides: 76 mg/dL (ref 0.0–149.0)
VLDL: 15.2 mg/dL (ref 0.0–40.0)

## 2021-12-04 LAB — VITAMIN B12: Vitamin B-12: >1550 pg/mL — ABNORMAL HIGH (ref 211–911)

## 2021-12-04 LAB — TSH: TSH: 2.65 u[IU]/mL (ref 0.35–5.50)

## 2021-12-04 LAB — VITAMIN D 25 HYDROXY (VIT D DEFICIENCY, FRACTURES): VITD: 34.81 ng/mL (ref 30.00–100.00)

## 2021-12-04 LAB — HEMOGLOBIN A1C: Hgb A1c MFr Bld: 5.4 % (ref 4.6–6.5)

## 2021-12-04 NOTE — Progress Notes (Signed)
Established Patient Office Visit     This visit occurred during the SARS-CoV-2 public health emergency.  Safety protocols were in place, including screening questions prior to the visit, additional usage of staff PPE, and extensive cleaning of exam room while observing appropriate contact time as indicated for disinfecting solutions.    CC/Reason for Visit: Annual preventive exam  HPI: Leslie [name] is a 45 y.o. female who is coming in today for the above mentioned reasons. 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 Earlie Server.  Otherwise she has been in excellent health.  She has no acute complaints today.  She has routine eye and dental care.  All immunizations are up-to-date.  She is overdue for initial screening colonoscopy.  She had mammogram and Pap smear with her GYN in October.   Past Medical/Surgical History: Past Medical History:  Diagnosis Date   met melanoma to brain and lung dx'd 04/2018   unknown origin   Migraines    Yeast infection     Past Surgical History:  Procedure Laterality Date   APPLICATION OF CRANIAL NAVIGATION Left 04/29/2018   Procedure: APPLICATION OF CRANIAL NAVIGATION;  Surgeon: Earnie Larsson, MD;  Location: [facility];  Service: Neurosurgery;  Laterality: Left;   CRANIOTOMY Left 04/29/2018   Procedure: LEFT CRANIOTOMY FOR  TUMOR BRAIN LAB;  Surgeon: Earnie Larsson, MD;  Location: [place];  Service: Neurosurgery;  Laterality: Left;    Social History:  reports that she has never smoked. She has never used smokeless tobacco. She reports current alcohol use. She reports that she does not use drugs.  Allergies: Allergies  Allergen Reactions   Diflucan [Fluconazole] Other (See Comments)    Wheezing and throat pressue    Family History:  Family History  Problem Relation Age of Onset   Cancer Father        lung     Current Outpatient Medications:    AMERICAN GINSENG PO, Take  1,500-3,000 mg by mouth daily. Depending on level of fatigue, Disp: , Rfl:    Amino Acids (L-CARNITINE) LIQD, Take 2,000 mg by mouth daily. Liquid L-Carnitine 2000 mg, Disp: , Rfl:    Calcium-Magnesium (CAL-MAG PO), Take 15 mLs by mouth daily. Marine Based Cal - Mag (500 mg Calcium / 250 mg Mag) 15 ml total, Disp: , Rfl:    Coenzyme Q10 (COQ-10) 100 MG CAPS, Take 1 tablet by mouth daily., Disp: , Rfl:    CREATINE MONOHYDRATE PO, Take 6 g by mouth daily. Creatine MonoHydrate 6 g (on training cycle), Disp: , Rfl:    Glucosamine-Chondroit-Vit C-Mn (GLUCOSAMINE 1500 COMPLEX PO), Take 1 tablet by mouth daily. Vegan CMO Glucosamine, Disp: , Rfl:    Hyaluronic Acid-Vitamin C (HYALURONIC ACID PO), Take 100 mg by mouth daily. Liquid Hyaluronic Acid 100 mg, Disp: , Rfl:    Iron-Folic Acid-Vit D92 (IRON FORMULA PO), Take 1 Dose by mouth daily. Iron 50 mg (1/2 before workout and 1/2 afterwards - cycle 1 wk off and on), Disp: , Rfl:    Lysine POWD, 1,600 mg by Does not apply route daily. L-Lysine Powder 1600 mg, Disp: , Rfl:    Methylsulfonylmethane (MSM) 1000 MG CAPS, Take 4 capsules by mouth daily. 4000 mg (1/2 before workout and 1/2 afterwards), Disp: , Rfl:    Multiple Vitamins-Minerals (ZINC PO), Take 1 tablet by mouth daily. Raw Zinc 30 mg (cycle every other week), Disp: , Rfl:    nivolumab (OPDIVO) 40  MG/4ML SOLN chemo injection, Opdivo 40 mg/4 mL intravenous solution  INFUSE 40 MG OVER 30 MINUTE(S) BY INTRAVENOUS ROUTE EVERY 2 WEEKS, Disp: , Rfl:    NON FORMULARY, Take 1 Dose by mouth daily. Cordyceps 750 mg, Disp: , Rfl:    NON FORMULARY, Take 1 Dose by mouth daily. Curcumin Phytosome (Bioperrine) 3000 mg, Disp: , Rfl:    NON FORMULARY, Take 1 tablet by mouth daily. Zinc L Carnosine Complex 75/59 mg (after workout), Disp: , Rfl:    SM Omega-3-6-9 Fatty Acids CAPS, Take by mouth., Disp: , Rfl:   Current Facility-Administered Medications:    acetaminophen (TYLENOL) tablet 650 mg, 650 mg, Oral, Q6H PRN,  Harriet Pho, Tessa N, PA-C  Review of Systems:  Constitutional: Denies fever, chills, diaphoresis, appetite change and fatigue.  HEENT: Denies photophobia, eye pain, redness, hearing loss, ear pain, congestion, sore throat, rhinorrhea, sneezing, mouth sores, trouble swallowing, neck pain, neck stiffness and tinnitus.   Respiratory: Denies SOB, DOE, cough, chest tightness,  and wheezing.   Cardiovascular: Denies chest pain, palpitations and leg swelling.  Gastrointestinal: Denies nausea, vomiting, abdominal pain, diarrhea, constipation, blood in stool and abdominal distention.  Genitourinary: Denies dysuria, urgency, frequency, hematuria, flank pain and difficulty urinating.  Endocrine: Denies: hot or cold intolerance, sweats, changes in hair or nails, polyuria, polydipsia. Musculoskeletal: Denies myalgias, back pain, joint swelling, arthralgias and gait problem.  Skin: Denies pallor, rash and wound.  Neurological: Denies dizziness, seizures, syncope, weakness, light-headedness, numbness and headaches.  Hematological: Denies adenopathy. Easy bruising, personal or family bleeding history  Psychiatric/Behavioral: Denies suicidal ideation, mood changes, confusion, nervousness, sleep disturbance and agitation    Physical Exam: Vitals:   12/04/21 0704  BP: 110/80  Pulse: 75  Temp: 98.1 F (36.7 C)  TempSrc: Oral  SpO2: 99%  Weight: 146 lb 1.6 oz (66.3 kg)  Height: _0  (1.753 m)    Body mass index is 21.58 kg/m.   Constitutional: NAD, calm, comfortable Eyes: PERRL, lids and conjunctivae normal ENMT: Mucous membranes are moist. Posterior pharynx clear of any exudate or lesions. Normal dentition. Tympanic membrane is pearly white, no erythema or bulging. Neck: normal, supple, no masses, no thyromegaly Respiratory: clear to auscultation bilaterally, no wheezing, no crackles. Normal respiratory effort. No accessory muscle use.  Cardiovascular: Regular rate and rhythm, no murmurs / rubs /  gallops. No extremity edema. 2+ pedal pulses. No carotid bruits.  Abdomen: no tenderness, no masses palpated. No hepatosplenomegaly. Bowel sounds positive.  Musculoskeletal: no clubbing / cyanosis. No joint deformity upper and lower extremities. Good ROM, no contractures. Normal muscle tone.  Skin: no rashes, lesions, ulcers. No induration Neurologic: CN 2-12 grossly intact. Sensation intact, DTR normal. Strength 5/5 in all 4.  Psychiatric: Normal judgment and insight. Alert and oriented x 3. Normal mood.    Impression and Plan:  Encounter for preventive health examination -Recommend routine eye and dental care. -Immunizations: All immunizations are up-to-date and age-appropriate -Healthy lifestyle discussed in detail. -Labs to be updated today. 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     Lelon Frohlich, MD [facility]

## 2021-12-09 DIAGNOSIS — M5386 Other specified dorsopathies, lumbar region: Secondary | ICD-10-CM | POA: Diagnosis not present

## 2021-12-09 DIAGNOSIS — M5382 Other specified dorsopathies, cervical region: Secondary | ICD-10-CM | POA: Diagnosis not present

## 2021-12-09 DIAGNOSIS — M9903 Segmental and somatic dysfunction of lumbar region: Secondary | ICD-10-CM | POA: Diagnosis not present

## 2021-12-09 DIAGNOSIS — M9901 Segmental and somatic dysfunction of cervical region: Secondary | ICD-10-CM | POA: Diagnosis not present

## 2021-12-15 ENCOUNTER — Encounter: Payer: Self-pay | Admitting: Internal Medicine

## 2021-12-16 ENCOUNTER — Other Ambulatory Visit: Payer: Self-pay | Admitting: Internal Medicine

## 2021-12-16 DIAGNOSIS — C7931 Secondary malignant neoplasm of brain: Secondary | ICD-10-CM

## 2021-12-17 ENCOUNTER — Encounter: Payer: Self-pay | Admitting: Internal Medicine

## 2021-12-18 ENCOUNTER — Other Ambulatory Visit: Payer: Self-pay | Admitting: Internal Medicine

## 2021-12-18 DIAGNOSIS — C7802 Secondary malignant neoplasm of left lung: Secondary | ICD-10-CM

## 2021-12-23 ENCOUNTER — Ambulatory Visit (HOSPITAL_COMMUNITY)
Admission: RE | Admit: 2021-12-23 | Discharge: 2021-12-23 | Disposition: A | Discharge status: Home / Self Care | Payer: BC Managed Care – PPO | Source: Ambulatory Visit | Attending: Internal Medicine | Admitting: Internal Medicine

## 2021-12-23 ENCOUNTER — Other Ambulatory Visit: Payer: Self-pay

## 2021-12-23 DIAGNOSIS — C439 Malignant melanoma of skin, unspecified: Secondary | ICD-10-CM | POA: Diagnosis not present

## 2021-12-23 DIAGNOSIS — C7931 Secondary malignant neoplasm of brain: Secondary | ICD-10-CM | POA: Diagnosis not present

## 2021-12-23 MED ORDER — GADOBUTROL 1 MMOL/ML IV SOLN
6.5000 mL | Freq: Once | INTRAVENOUS | Status: AC | PRN
  Administered 2021-12-23: 6.5 mL via INTRAVENOUS

## 2021-12-29 ENCOUNTER — Encounter: Payer: Self-pay | Admitting: Medical Oncology

## 2021-12-30 ENCOUNTER — Inpatient Hospital Stay: Payer: BC Managed Care – PPO | Attending: Internal Medicine | Admitting: Internal Medicine

## 2021-12-30 DIAGNOSIS — C7800 Secondary malignant neoplasm of unspecified lung: Secondary | ICD-10-CM | POA: Insufficient documentation

## 2021-12-30 DIAGNOSIS — C7931 Secondary malignant neoplasm of brain: Secondary | ICD-10-CM | POA: Diagnosis not present

## 2021-12-30 DIAGNOSIS — M9901 Segmental and somatic dysfunction of cervical region: Secondary | ICD-10-CM | POA: Diagnosis not present

## 2021-12-30 DIAGNOSIS — M5386 Other specified dorsopathies, lumbar region: Secondary | ICD-10-CM | POA: Diagnosis not present

## 2021-12-30 DIAGNOSIS — M9903 Segmental and somatic dysfunction of lumbar region: Secondary | ICD-10-CM | POA: Diagnosis not present

## 2021-12-30 DIAGNOSIS — C439 Malignant melanoma of skin, unspecified: Secondary | ICD-10-CM | POA: Insufficient documentation

## 2021-12-30 DIAGNOSIS — Z923 Personal history of irradiation: Secondary | ICD-10-CM | POA: Insufficient documentation

## 2021-12-30 DIAGNOSIS — Z79899 Other long term (current) drug therapy: Secondary | ICD-10-CM | POA: Insufficient documentation

## 2021-12-30 DIAGNOSIS — M5382 Other specified dorsopathies, cervical region: Secondary | ICD-10-CM | POA: Diagnosis not present

## 2021-12-30 DIAGNOSIS — S29011A Strain of muscle and tendon of front wall of thorax, initial encounter: Secondary | ICD-10-CM | POA: Diagnosis not present

## 2021-12-30 DIAGNOSIS — Z9221 Personal history of antineoplastic chemotherapy: Secondary | ICD-10-CM | POA: Insufficient documentation

## 2021-12-30 NOTE — Progress Notes (Signed)
I connected with Leslie [name] on 12/30/21 at  2:00 PM EST by telephone visit and verified that I am speaking with the correct person using two identifiers.  I discussed the limitations, risks, security and privacy concerns of performing an evaluation and management service by telemedicine and the availability of in-person appointments. I also discussed with the patient that there may be a patient responsible charge related to this service. The patient expressed understanding and agreed to proceed.  Other persons participating in the visit and their role in the encounter:  n/a  Patient's location:  Home  Provider's location:  Office  Chief Complaint:  Malignant melanoma metastatic to brain [facility]) - Plan: MR BRAIN W WO CONTRAST  History of Present Ilness: Leslie [name] describes no new or progressive neurologic deficits.  No headaches or seizures.   Observations: Language and cognition at baseline  Imaging:  [place] Clinician Interpretation: I have personally reviewed the CNS images as listed.  My interpretation, in the context of the patient's clinical presentation, is stable disease  MR BRAIN W WO CONTRAST  Result Date: 12/23/2021 CLINICAL DATA:  Malignant melanoma with metastases to the brain. Brain/CNS neoplasm. Assess treatment. EXAM: MRI HEAD WITHOUT AND WITH CONTRAST TECHNIQUE: Multiplanar, multiecho pulse sequences of the brain and surrounding structures were obtained without and with intravenous contrast. CONTRAST:  6.51m GADAVIST GADOBUTROL 1 MMOL/ML IV SOLN COMPARISON:  MRI of the head without and with contrast 02/19/2021 at [facility] FINDINGS: Brain: Nodular enhancement in the medial inferior aspect of the left temporal resection cavity is stable. Chronic encephalomalacia is stable. No new areas of enhancement are present. No acute cortical infarct, hemorrhage, or mass lesion is present. Scattered foci of periventricular T2 hyperintensity predominantly posteriorly is stable. The  ventricles are of normal size. No significant extraaxial fluid collection is present. The internal auditory canals are within normal limits. The brainstem and cerebellum are within normal limits. Vascular: Flow is present in the major intracranial arteries. Skull and upper cervical spine: The craniocervical junction is normal. Upper cervical spine is within normal limits. Marrow signal is unremarkable. Sinuses/Orbits: The paranasal sinuses and mastoid air cells are clear. The globes and orbits are within normal limits. IMPRESSION: 1. Stable nodular enhancement in the inferior aspect of the left temporal resection cavity. Given stability over time, this is likely the sequela of prior treatment. 2. No evidence for residual or recurrent tumor. 3. Stable mild white matter disease. This may be related to prior therapy. Electronically Signed   By: CSan MorelleM.D.   On: 12/23/2021 22:59    Assessment and Plan: Malignant melanoma metastatic to brain ([facility] - Plan: MR BRAIN W WO CONTRAST  Clinically and radiographically stable.   Follow Up Instructions: Return with Brain MRI in 1 year.  I discussed the assessment and treatment plan with the patient.  The patient was provided an opportunity to ask questions and all were answered.  The patient agreed with the plan and demonstrated understanding of the instructions.    The patient was advised to call back or seek an in-person evaluation if the symptoms worsen or if the condition fails to improve as anticipated.  I provided 5-10 minutes of non-face-to-face time during this enocunter.  ZVentura Sellers MD   I provided 15 minutes of non face-to-face telephone visit time during this encounter, and > 50% was spent counseling as documented under my assessment & plan.

## 2022-01-01 ENCOUNTER — Encounter: Payer: Self-pay | Admitting: Internal Medicine

## 2022-01-01 ENCOUNTER — Telehealth: Payer: Self-pay | Admitting: Internal Medicine

## 2022-01-01 DIAGNOSIS — M546 Pain in thoracic spine: Secondary | ICD-10-CM | POA: Diagnosis not present

## 2022-01-01 DIAGNOSIS — J939 Pneumothorax, unspecified: Secondary | ICD-10-CM | POA: Diagnosis not present

## 2022-01-01 NOTE — Telephone Encounter (Signed)
Scheduled appointment per 1/19 scheduling message. Patient is aware. Patient was transferred to the nurse due to concerns related to the upcoming appointment.

## 2022-01-02 ENCOUNTER — Encounter: Payer: Self-pay | Admitting: Internal Medicine

## 2022-01-02 NOTE — Telephone Encounter (Signed)
I spoke with pt and advised as indicated. She expressed understanding of this information.

## 2022-01-05 ENCOUNTER — Telehealth: Payer: Self-pay

## 2022-01-05 NOTE — Telephone Encounter (Signed)
Patient contacted the office 01/01/22 concerned about a cracked rib. She states that she is a former patient of Dr. Everrett Coombe who recently had a chest xray scan that showed a cracked rib. She states that this happened before and it punctured her lung and she had to be seen by our physicians. She was afraid that it would happen again. She states that she has not heard back from her PCP yet but wanted to know if there was anything our office could do.  Advised that unless she was having symptoms that would indicate she may need to see our office she would need to wait until she heard back from her PCP for next steps.  Advised that she should rest as much as she could at this point and that pain management is best. Advised that she could take Tylenol/Ibuprofen for the pain until she can get in to be seen by her PCP.

## 2022-01-06 ENCOUNTER — Encounter: Payer: Self-pay | Admitting: Internal Medicine

## 2022-01-06 DIAGNOSIS — M5382 Other specified dorsopathies, cervical region: Secondary | ICD-10-CM | POA: Diagnosis not present

## 2022-01-06 DIAGNOSIS — M5386 Other specified dorsopathies, lumbar region: Secondary | ICD-10-CM | POA: Diagnosis not present

## 2022-01-06 DIAGNOSIS — M9903 Segmental and somatic dysfunction of lumbar region: Secondary | ICD-10-CM | POA: Diagnosis not present

## 2022-01-06 DIAGNOSIS — M9901 Segmental and somatic dysfunction of cervical region: Secondary | ICD-10-CM | POA: Diagnosis not present

## 2022-01-06 DIAGNOSIS — S29011A Strain of muscle and tendon of front wall of thorax, initial encounter: Secondary | ICD-10-CM | POA: Diagnosis not present

## 2022-01-08 ENCOUNTER — Encounter: Payer: Self-pay | Admitting: Internal Medicine

## 2022-01-09 ENCOUNTER — Other Ambulatory Visit: Payer: Self-pay

## 2022-01-09 ENCOUNTER — Inpatient Hospital Stay: Payer: BC Managed Care – PPO

## 2022-01-09 ENCOUNTER — Ambulatory Visit (HOSPITAL_COMMUNITY)
Admission: RE | Admit: 2022-01-09 | Discharge: 2022-01-09 | Disposition: A | Discharge status: Home / Self Care | Payer: BC Managed Care – PPO | Source: Ambulatory Visit | Attending: Internal Medicine | Admitting: Internal Medicine

## 2022-01-09 DIAGNOSIS — C439 Malignant melanoma of skin, unspecified: Secondary | ICD-10-CM | POA: Diagnosis not present

## 2022-01-09 DIAGNOSIS — C7802 Secondary malignant neoplasm of left lung: Secondary | ICD-10-CM | POA: Diagnosis not present

## 2022-01-09 LAB — CMP (CANCER CENTER ONLY)
ALT: 20 U/L (ref 0–44)
AST: 27 U/L (ref 15–41)
Albumin: 4.4 g/dL (ref 3.5–5.0)
Alkaline Phosphatase: 57 U/L (ref 38–126)
Anion gap: 7 (ref 5–15)
BUN: 21 mg/dL — ABNORMAL HIGH (ref 6–20)
CO2: 27 mmol/L (ref 22–32)
Calcium: 9.4 mg/dL (ref 8.9–10.3)
Chloride: 105 mmol/L (ref 98–111)
Creatinine: 0.95 mg/dL (ref 0.44–1.00)
GFR, Estimated: >60 mL/min (ref >60)
Glucose, Bld: 75 mg/dL (ref 70–99)
Potassium: 4.1 mmol/L (ref 3.5–5.1)
Sodium: 139 mmol/L (ref 135–145)
Total Bilirubin: 0.3 mg/dL (ref 0.3–1.2)
Total Protein: 6.9 g/dL (ref 6.5–8.1)

## 2022-01-09 LAB — CBC WITH DIFFERENTIAL (CANCER CENTER ONLY)
Abs Immature Granulocytes: 0.02 10*3/uL (ref 0.00–0.07)
Basophils Absolute: 0.1 10*3/uL (ref 0.0–0.1)
Basophils Relative: 1 %
Eosinophils Absolute: 0.2 10*3/uL (ref 0.0–0.5)
Eosinophils Relative: 2 %
HCT: 39.8 % (ref 36.0–46.0)
Hemoglobin: 13.2 g/dL (ref 12.0–15.0)
Immature Granulocytes: 0 %
Lymphocytes Relative: 16 %
Lymphs Abs: 1.2 10*3/uL (ref 0.7–4.0)
MCH: 31.1 pg (ref 26.0–34.0)
MCHC: 33.2 g/dL (ref 30.0–36.0)
MCV: 93.9 fL (ref 80.0–100.0)
Monocytes Absolute: 0.6 10*3/uL (ref 0.1–1.0)
Monocytes Relative: 8 %
Neutro Abs: 5.4 10*3/uL (ref 1.7–7.7)
Neutrophils Relative %: 73 %
Platelet Count: 245 10*3/uL (ref 150–400)
RBC: 4.24 MIL/uL (ref 3.87–5.11)
RDW: 13.2 % (ref 11.5–15.5)
WBC Count: 7.4 10*3/uL (ref 4.0–10.5)
nRBC: 0 % (ref 0.0–0.2)

## 2022-01-09 LAB — GLUCOSE, CAPILLARY: Glucose-Capillary: 76 mg/dL (ref 70–99)

## 2022-01-09 MED ORDER — FLUDEOXYGLUCOSE F - 18 (FDG) INJECTION
7.6600 | Freq: Once | INTRAVENOUS | Status: AC
  Administered 2022-01-09: 7.66 via INTRAVENOUS

## 2022-01-12 ENCOUNTER — Encounter: Payer: Self-pay | Admitting: Internal Medicine

## 2022-01-12 ENCOUNTER — Inpatient Hospital Stay (HOSPITAL_BASED_OUTPATIENT_CLINIC_OR_DEPARTMENT_OTHER): Payer: BC Managed Care – PPO | Admitting: Internal Medicine

## 2022-01-12 ENCOUNTER — Other Ambulatory Visit: Payer: Self-pay

## 2022-01-12 VITALS — BP 113/68 | HR 73 | Temp 96.8°F | Resp 18 | Ht 69.0 in | Wt 149.7 lb

## 2022-01-12 DIAGNOSIS — C7931 Secondary malignant neoplasm of brain: Secondary | ICD-10-CM

## 2022-01-12 DIAGNOSIS — Z79899 Other long term (current) drug therapy: Secondary | ICD-10-CM | POA: Diagnosis not present

## 2022-01-12 DIAGNOSIS — Z9221 Personal history of antineoplastic chemotherapy: Secondary | ICD-10-CM | POA: Diagnosis not present

## 2022-01-12 DIAGNOSIS — Z923 Personal history of irradiation: Secondary | ICD-10-CM | POA: Diagnosis not present

## 2022-01-12 DIAGNOSIS — C439 Malignant melanoma of skin, unspecified: Secondary | ICD-10-CM | POA: Diagnosis not present

## 2022-01-12 DIAGNOSIS — C7800 Secondary malignant neoplasm of unspecified lung: Secondary | ICD-10-CM | POA: Diagnosis not present

## 2022-01-12 NOTE — Progress Notes (Signed)
[place] Telephone:([phone]) [phone]   Fax:([phone]) [phone]  OFFICE PROGRESS NOTE  Isaac Bliss, Rayford Halsted, MD [place] [address]  DIAGNOSIS: Metastatic high-grade neoplasm consistent with metastatic malignant melanoma based on the most recent pathology report from [facility] in October 2019, presented with large left left upper/left lower lobe mass with a hypermetabolic AP window lymph node as well as solitary metastatic brain lesion diagnosed in May 2019.  Biomarker Findings Microsatellite status - MS-Stable Tumor Mutational Burden - TMB-Intermediate (16 Muts/Mb) Genomic Findings For a complete list of the genes assayed, please refer to the Appendix. CD274 (PD-L1) amplification NRAS Q61R PDCD1LG2 (PD-L2) amplification MYC amplification EPHB1 amplification JAK2 amplification RB1 Q93*, Q244* TERT promoter -146C>T TP53 C275W   PRIOR THERAPY: 1) left temporal craniotomy with resection of tumor with intraoperative stereotactic guidance for volumetric resection under the care of Dr. Annette Stable on 04/29/2018. 2) First-line treatment with immunotherapy with Keytruda 200 mg IV every 3 weeks.  First dose June 02, 2018.  Status post 3 cycles.  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 3) palliative radiotherapy to the left lung mass under the care of Dr. Lisbeth Renshaw completed on 08/24/2018. 4) Resuming her treatment again with Keytruda 200 mg IV every 3 weeks, first dose 09/08/2018.  Status post 2 cycles.  CURRENT THERAPY: Treatment with immunotherapy with ipilimumab 3 mg/KG and nivolumab 1 mg/KG every 3 weeks for the first 4 cycles followed by maintenance nivolumab 240 mg IV every 2 weeks.  First dose of this treatment October 26, 2018.  Status post 40 cycles.  Ipilimumab was discontinued after cycle #2 for the significant liver dysfunction.  Starting from cycle #3 the patient is on treatment with single  agent nivolumab. Starting from cycle #39 the patient is switching to nivolumab 480 mg IV every 4 weeks.  Her treatment is currently on hold since December 02, 2020.  INTERVAL HISTORY: Leslie [name] 46 y.o. female returns to the clinic today for follow-up visit.  The patient is feeling fine today with no concerning complaints.  Over the last few weeks she has been complaining of pain on the left side of the chest with radiation to the right.  She was very anxious and concerned about recurrence of her disease.  She had chest x-ray initially that showed no new rib fractures.  The patient requested to move her PET scan sooner for evaluation of her condition.  This was done few days ago and she is here for evaluation and discussion of the scan results.  She also had MRI of the brain 2 weeks ago that was unremarkable.  She denied having any current chest pain, shortness of breath, cough or hemoptysis.  She denied having any fever or chills.  She has no nausea, vomiting, diarrhea or constipation.  She has no headache or visual changes.  MEDICAL HISTORY: Past Medical History:  Diagnosis Date   met melanoma to brain and lung dx'd 04/2018   unknown origin   Migraines    Yeast infection     ALLERGIES:  is allergic to diflucan [fluconazole].  MEDICATIONS:  Current Outpatient Medications  Medication Sig Dispense Refill   AMERICAN GINSENG PO Take 1,500-3,000 mg by mouth daily. Depending on level of fatigue     Amino Acids (L-CARNITINE) LIQD Take 2,000 mg by mouth daily. Liquid L-Carnitine 2000 mg     Calcium-Magnesium (CAL-MAG PO) Take 15 mLs by mouth daily. Marine Based Cal - Mag (500 mg  Calcium / 250 mg Mag) 15 ml total     Coenzyme Q10 (COQ-10) 100 MG CAPS Take 1 tablet by mouth daily.     CREATINE MONOHYDRATE PO Take 6 g by mouth daily. Creatine MonoHydrate 6 g (on training cycle)     Glucosamine-Chondroit-Vit C-Mn (GLUCOSAMINE 1500 COMPLEX PO) Take 1 tablet by mouth daily. Vegan CMO Glucosamine      Hyaluronic Acid-Vitamin C (HYALURONIC ACID PO) Take 100 mg by mouth daily. Liquid Hyaluronic Acid 970 mg     Iron-Folic Acid-Vit Y63 (IRON FORMULA PO) Take 1 Dose by mouth daily. Iron 50 mg (1/2 before workout and 1/2 afterwards - cycle 1 wk off and on)     Lysine POWD 1,600 mg by Does not apply route daily. L-Lysine Powder 1600 mg     Methylsulfonylmethane (MSM) 1000 MG CAPS Take 4 capsules by mouth daily. 4000 mg (1/2 before workout and 1/2 afterwards)     Multiple Vitamins-Minerals (ZINC PO) Take 1 tablet by mouth daily. Raw Zinc 30 mg (cycle every other week)     nivolumab (OPDIVO) 40 MG/4ML SOLN chemo injection Opdivo 40 mg/4 mL intravenous solution  INFUSE 40 MG OVER 30 MINUTE(S) BY INTRAVENOUS ROUTE EVERY 2 WEEKS     NON FORMULARY Take 1 Dose by mouth daily. Cordyceps 750 mg     NON FORMULARY Take 1 Dose by mouth daily. Curcumin Phytosome (Bioperrine) 3000 mg     NON FORMULARY Take 1 tablet by mouth daily. Zinc L Carnosine Complex 75/59 mg (after workout)     SM Omega-3-6-9 Fatty Acids CAPS Take by mouth.     Current Facility-Administered Medications  Medication Dose Route Frequency Provider Last Rate Last Admin   acetaminophen (TYLENOL) tablet 650 mg  650 mg Oral Q6H PRN Elgie Collard, PA-C        SURGICAL HISTORY:  Past Surgical History:  Procedure Laterality Date   APPLICATION OF CRANIAL NAVIGATION Left 04/29/2018   Procedure: APPLICATION OF CRANIAL NAVIGATION;  Surgeon: Earnie Larsson, MD;  Location: [place];  Service: Neurosurgery;  Laterality: Left;   CRANIOTOMY Left 04/29/2018   Procedure: LEFT CRANIOTOMY FOR  TUMOR BRAIN LAB;  Surgeon: Earnie Larsson, MD;  Location: [place];  Service: Neurosurgery;  Laterality: Left;    REVIEW OF SYSTEMS:  Constitutional: negative Eyes: negative Ears, nose, mouth, throat, and face: negative Respiratory: negative Cardiovascular: negative Gastrointestinal: negative Genitourinary:negative Integument/breast: negative Hematologic/lymphatic:  negative Musculoskeletal:negative Neurological: negative Behavioral/Psych: positive for anxiety Endocrine: negative Allergic/Immunologic: negative   PHYSICAL EXAMINATION: General appearance: alert, cooperative, and no distress Head: Normocephalic, without obvious abnormality, atraumatic Neck: no adenopathy, no JVD, supple, symmetrical, trachea midline, and thyroid not enlarged, symmetric, no tenderness/mass/nodules Lymph nodes: Cervical, supraclavicular, and axillary nodes normal. Resp: clear to auscultation bilaterally Back: negative, symmetric, no curvature. ROM normal. No CVA tenderness. Cardio: regular rate and rhythm, S1, S2 normal, no murmur, click, rub or gallop GI: soft, non-tender; bowel sounds normal; no masses,  no organomegaly Extremities: extremities normal, atraumatic, no cyanosis or edema Neurologic: Alert and oriented X 3, normal strength and tone. Normal symmetric reflexes. Normal coordination and gait  ECOG PERFORMANCE STATUS: 0 - Asymptomatic  Blood pressure 113/68, pulse 73, temperature (!) 96.8 F (36 C), temperature source Tympanic, resp. rate 18, height _0  (1.753 m), weight 149 lb 11.2 oz (67.9 kg), SpO2 100 %.  LABORATORY DATA: Lab Results  Component Value Date   WBC 7.4 01/09/2022   HGB 13.2 01/09/2022   HCT 39.8 01/09/2022   MCV 93.9  01/09/2022   PLT 245 01/09/2022      Chemistry      Component Value Date/Time   NA 139 01/09/2022 1300   K 4.1 01/09/2022 1300   CL 105 01/09/2022 1300   CO2 27 01/09/2022 1300   BUN 21 (H) 01/09/2022 1300   CREATININE 0.95 01/09/2022 1300      Component Value Date/Time   CALCIUM 9.4 01/09/2022 1300   ALKPHOS 57 01/09/2022 1300   AST 27 01/09/2022 1300   ALT 20 01/09/2022 1300   BILITOT 0.3 01/09/2022 1300       RADIOGRAPHIC STUDIES: MR BRAIN W WO CONTRAST  Result Date: 12/23/2021 CLINICAL DATA:  Malignant melanoma with metastases to the brain. Brain/CNS neoplasm. Assess treatment. EXAM: MRI HEAD  WITHOUT AND WITH CONTRAST TECHNIQUE: Multiplanar, multiecho pulse sequences of the brain and surrounding structures were obtained without and with intravenous contrast. CONTRAST:  6.24m GADAVIST GADOBUTROL 1 MMOL/ML IV SOLN COMPARISON:  MRI of the head without and with contrast 02/19/2021 at [facility] FINDINGS: Brain: Nodular enhancement in the medial inferior aspect of the left temporal resection cavity is stable. Chronic encephalomalacia is stable. No new areas of enhancement are present. No acute cortical infarct, hemorrhage, or mass lesion is present. Scattered foci of periventricular T2 hyperintensity predominantly posteriorly is stable. The ventricles are of normal size. No significant extraaxial fluid collection is present. The internal auditory canals are within normal limits. The brainstem and cerebellum are within normal limits. Vascular: Flow is present in the major intracranial arteries. Skull and upper cervical spine: The craniocervical junction is normal. Upper cervical spine is within normal limits. Marrow signal is unremarkable. Sinuses/Orbits: The paranasal sinuses and mastoid air cells are clear. The globes and orbits are within normal limits. IMPRESSION: 1. Stable nodular enhancement in the inferior aspect of the left temporal resection cavity. Given stability over time, this is likely the sequela of prior treatment. 2. No evidence for residual or recurrent tumor. 3. Stable mild white matter disease. This may be related to prior therapy. Electronically Signed   By: CSan MorelleM.D.   On: 12/23/2021 22:59   NM PET Image Restage (PS) Whole Body  Result Date: 01/09/2022 CLINICAL DATA:  Subsequent treatment strategy for metastatic melanoma. Previous craniotomy and immunotherapy. EXAM: NUCLEAR MEDICINE PET WHOLE BODY TECHNIQUE: 7.66 mCi F-18 FDG was injected intravenously. Full-ring PET imaging was performed from the head to foot after the radiotracer. CT data was obtained and used for  attenuation correction and anatomic localization. Fasting blood glucose: 76 mg/dl COMPARISON:  MRI head 12/23/2021, CTs of the chest, abdomen and pelvis 09/26/2021 and PET-CT 12/26/2020. FINDINGS: Mediastinal blood pool activity: SUV max 1.8 HEAD/ NECK: No hypermetabolic cervical lymph nodes are identified.There are no lesions of the pharyngeal mucosal space. No abnormal intracranial activity identified. Incidental CT findings: none CHEST: There are no hypermetabolic mediastinal, hilar or axillary lymph nodes. No hypermetabolic pulmonary activity or suspicious nodularity. Specifically, there is no hypermetabolic activity associated with chronic scarring medially in the lingula and left lower lobe. Incidental CT findings: none ABDOMEN/PELVIS: There is no hypermetabolic activity within the liver, adrenal glands, spleen or pancreas. There is no hypermetabolic nodal activity. There is low-level adnexal activity bilaterally, with an SUV max of 5.0 on the right and 7.3 on the left. This activity has mildly increased compared with the prior PET-CT, although there is no corresponding suspicious adnexal finding on the CT images or the previous pelvic CT 3 months ago. Incidental CT findings: none SKELETON: There  is no hypermetabolic activity to suggest osseous metastatic disease. Incidental CT findings: none EXTREMITIES: No suspicious metabolic activity within the extremities. There is symmetric soft tissue activity laterally in both forefeet compatible with incidental pressure lesions. Incidental CT findings: none IMPRESSION: 1. No findings highly suspicious for pulmonary recurrence or new metastatic disease. There is no abnormal activity associated with the scarring in the lingula or left lower lobe. 2. Nonspecific adnexal activity bilaterally, mildly increased from previous PET-CT, but without corresponding CT finding and likely physiologic. Recommend attention on follow-up. This could be further assessed with pelvic  ultrasound if clinically warranted. Electronically Signed   By: Richardean Sale M.D.   On: 01/09/2022 16:00     ASSESSMENT AND PLAN: This is a very pleasant 46 years old white female with highly suspicious metastatic malignant melanoma presented with large mass in the left upper/left lower lobe and mediastinal lymphadenopathy as well as solitary brain metastasis status post left temporal craniotomy and resection of tumor on 04/29/2018 and she is recovering well from her surgery. The patient completed stereotactic radiotherapy to the resection cavity next week under the care of Dr. Lisbeth Renshaw. The patient underwent treatment with immunotherapy with Keytruda 200 mg IV every 3 weeks status post 3 cycles.   Her scan after cycle #3 showed enlargement of the left upper lobe lung mass.  This was also suspicious for pseudo-progression on immunotherapy.  The patient was started on a palliative course of radiotherapy to the left upper lobe lung mass and she tolerated this treatment fairly well.  She was seen by Dr. Emelda Brothers at [facility] and she recommended for the patient to resume her treatment with [facility] for now until she undergoes further molecular studies.  The patient was treated with 2 more cycles of Keytruda and tolerated the treatment well. Repeat CT scan of the chest, abdomen and pelvis at that time showed some improvement in the left upper lobe lung mass but there is still concern about pericardial invasion. She is still not a good candidate for surgical resection because of the pericardial invasion. The final pathology report and recommendation from [facility] was consistent with metastatic melanoma and the recommendation is to switch the patient to a combination immunotherapy with Ipilumumab and nivolumab. The patient underwent treatment with immunotherapy with ipilimumab and nivolumab status post 2 cycles.  Her treatment was on hold for more than 6 weeks secondary to grade 3  hepatic dysfunction.  She had improvement of her liver enzyme after a prolonged treatment with a steroid with a tapering schedule. She resumed her treatment again with single agent nivolumab.  She is status post 40 cycles of treatment. She continues to tolerate her treatment well with no concerning adverse effects. Starting from cycle #39 the patient will be treated with nivolumab 480 mg IV every 4 weeks. The patient has been tolerating this treatment well with no concerning adverse effects. The last PET scan performed almost 10 months ago showed no evidence of residual disease. The patient is currently on observation since her last dose on December 02, 2020. The patient has been doing fine but recently she had some intermittent left-sided chest pain with radiation to the right and she was very anxious about her condition. She had a PET scan performed few days ago.  I personally and independently reviewed the scan images and discussed the results with the patient today. Her scan showed no concerning findings for disease recurrence or metastasis. I assured the patient and recommended for her to continue on  observation with repeat CT scan of the chest, abdomen and pelvis in 6 months for restaging of her disease and to evaluate the adnexal lesions. She was also advised to call immediately if she has any other concerning symptoms in the interval. The patient voices understanding of current disease status and treatment options and is in agreement with the current care plan. All questions were answered. The patient knows to call the clinic with any problems, questions or concerns. We can certainly see the patient much sooner if necessary.  Disclaimer: This note was dictated with voice recognition software. Similar sounding words can inadvertently be transcribed and may not be corrected upon review.

## 2022-01-28 DIAGNOSIS — M5386 Other specified dorsopathies, lumbar region: Secondary | ICD-10-CM | POA: Diagnosis not present

## 2022-01-28 DIAGNOSIS — M5382 Other specified dorsopathies, cervical region: Secondary | ICD-10-CM | POA: Diagnosis not present

## 2022-01-28 DIAGNOSIS — S29011A Strain of muscle and tendon of front wall of thorax, initial encounter: Secondary | ICD-10-CM | POA: Diagnosis not present

## 2022-01-28 DIAGNOSIS — M9903 Segmental and somatic dysfunction of lumbar region: Secondary | ICD-10-CM | POA: Diagnosis not present

## 2022-01-28 DIAGNOSIS — M9901 Segmental and somatic dysfunction of cervical region: Secondary | ICD-10-CM | POA: Diagnosis not present

## 2022-01-30 ENCOUNTER — Ambulatory Visit (HOSPITAL_COMMUNITY): Payer: BC Managed Care – PPO

## 2022-01-30 ENCOUNTER — Other Ambulatory Visit: Payer: BC Managed Care – PPO

## 2022-02-02 ENCOUNTER — Ambulatory Visit: Payer: BC Managed Care – PPO | Admitting: Internal Medicine

## 2022-02-17 DIAGNOSIS — M5382 Other specified dorsopathies, cervical region: Secondary | ICD-10-CM | POA: Diagnosis not present

## 2022-02-17 DIAGNOSIS — M5386 Other specified dorsopathies, lumbar region: Secondary | ICD-10-CM | POA: Diagnosis not present

## 2022-02-17 DIAGNOSIS — M9903 Segmental and somatic dysfunction of lumbar region: Secondary | ICD-10-CM | POA: Diagnosis not present

## 2022-02-17 DIAGNOSIS — M9901 Segmental and somatic dysfunction of cervical region: Secondary | ICD-10-CM | POA: Diagnosis not present

## 2022-02-17 DIAGNOSIS — S29011A Strain of muscle and tendon of front wall of thorax, initial encounter: Secondary | ICD-10-CM | POA: Diagnosis not present

## 2022-02-25 DIAGNOSIS — N926 Irregular menstruation, unspecified: Secondary | ICD-10-CM | POA: Diagnosis not present

## 2022-02-25 DIAGNOSIS — N840 Polyp of corpus uteri: Secondary | ICD-10-CM | POA: Diagnosis not present

## 2022-03-18 ENCOUNTER — Encounter: Payer: Self-pay | Admitting: Gastroenterology

## 2022-03-25 DIAGNOSIS — N840 Polyp of corpus uteri: Secondary | ICD-10-CM | POA: Diagnosis not present

## 2022-03-25 DIAGNOSIS — N921 Excessive and frequent menstruation with irregular cycle: Secondary | ICD-10-CM | POA: Diagnosis not present

## 2022-04-08 DIAGNOSIS — M9903 Segmental and somatic dysfunction of lumbar region: Secondary | ICD-10-CM | POA: Diagnosis not present

## 2022-04-08 DIAGNOSIS — M5386 Other specified dorsopathies, lumbar region: Secondary | ICD-10-CM | POA: Diagnosis not present

## 2022-04-08 DIAGNOSIS — S29011A Strain of muscle and tendon of front wall of thorax, initial encounter: Secondary | ICD-10-CM | POA: Diagnosis not present

## 2022-04-10 ENCOUNTER — Telehealth: Payer: Self-pay

## 2022-04-10 NOTE — Telephone Encounter (Signed)
Dr. Tarri Glenn- ?Please review this patient's chart and advise if patient needs an OV prior to procedure. ?Patient is currently receiving IV immunotherapy for melanoma with mets to lung and brain -dx in 2019. ? ?Please/Thank you ?

## 2022-04-13 NOTE — Telephone Encounter (Signed)
Noted on PV chart ?

## 2022-04-14 DIAGNOSIS — Z923 Personal history of irradiation: Secondary | ICD-10-CM | POA: Diagnosis not present

## 2022-04-14 DIAGNOSIS — N840 Polyp of corpus uteri: Secondary | ICD-10-CM | POA: Diagnosis not present

## 2022-04-14 DIAGNOSIS — C439 Malignant melanoma of skin, unspecified: Secondary | ICD-10-CM | POA: Diagnosis not present

## 2022-04-14 DIAGNOSIS — C7931 Secondary malignant neoplasm of brain: Secondary | ICD-10-CM | POA: Diagnosis not present

## 2022-04-14 DIAGNOSIS — R9089 Other abnormal findings on diagnostic imaging of central nervous system: Secondary | ICD-10-CM | POA: Diagnosis not present

## 2022-04-14 DIAGNOSIS — C792 Secondary malignant neoplasm of skin: Secondary | ICD-10-CM | POA: Diagnosis not present

## 2022-04-14 DIAGNOSIS — Z6821 Body mass index (BMI) 21.0-21.9, adult: Secondary | ICD-10-CM | POA: Diagnosis not present

## 2022-04-15 DIAGNOSIS — S29011A Strain of muscle and tendon of front wall of thorax, initial encounter: Secondary | ICD-10-CM | POA: Diagnosis not present

## 2022-04-15 DIAGNOSIS — M5386 Other specified dorsopathies, lumbar region: Secondary | ICD-10-CM | POA: Diagnosis not present

## 2022-04-15 DIAGNOSIS — M9903 Segmental and somatic dysfunction of lumbar region: Secondary | ICD-10-CM | POA: Diagnosis not present

## 2022-04-24 ENCOUNTER — Ambulatory Visit (AMBULATORY_SURGERY_CENTER): Payer: BC Managed Care – PPO

## 2022-04-24 VITALS — Ht 69.0 in | Wt 147.0 lb

## 2022-04-24 DIAGNOSIS — Z1211 Encounter for screening for malignant neoplasm of colon: Secondary | ICD-10-CM

## 2022-04-24 MED ORDER — NA SULFATE-K SULFATE-MG SULF 17.5-3.13-1.6 GM/177ML PO SOLN: 1.0000 | Freq: Once | ORAL | 0 refills | Status: AC

## 2022-04-24 NOTE — Progress Notes (Signed)
No egg or soy allergy known to patient  ?No issues known to pt with past sedation with any surgeries or procedures ?Patient denies ever being told they had issues or difficulty with intubation  ?No FH of Malignant Hyperthermia ?Pt is not on diet pills ?Pt is not on home 02  ?Pt is not on blood thinners  ?Pt denies issues with constipation at this time; ?No A fib or A flutter ?NO PA's for preps discussed with pt in PV today  ?Discussed with pt there will be an out-of-pocket cost for prep and that varies from $0 to 70 + dollars - pt verbalized understanding  ?Pt instructed to use Singlecare.com or GoodRx for a price reduction on prep  ?PV completed over the phone. Pt verified name, DOB, address and insurance during PV today.  ? ?Pt encouraged to call with questions or issues.  ?If pt has My chart, procedure instructions sent via My Chart  ?Insurance confirmed with pt at [facility] today  ? ?

## 2022-05-05 DIAGNOSIS — L578 Other skin changes due to chronic exposure to nonionizing radiation: Secondary | ICD-10-CM | POA: Diagnosis not present

## 2022-05-05 DIAGNOSIS — D224 Melanocytic nevi of scalp and neck: Secondary | ICD-10-CM | POA: Diagnosis not present

## 2022-05-05 DIAGNOSIS — D225 Melanocytic nevi of trunk: Secondary | ICD-10-CM | POA: Diagnosis not present

## 2022-05-05 DIAGNOSIS — L821 Other seborrheic keratosis: Secondary | ICD-10-CM | POA: Diagnosis not present

## 2022-05-05 DIAGNOSIS — D485 Neoplasm of uncertain behavior of skin: Secondary | ICD-10-CM | POA: Diagnosis not present

## 2022-05-06 ENCOUNTER — Other Ambulatory Visit: Payer: Self-pay

## 2022-05-06 ENCOUNTER — Encounter (HOSPITAL_BASED_OUTPATIENT_CLINIC_OR_DEPARTMENT_OTHER): Payer: Self-pay | Admitting: Obstetrics and Gynecology

## 2022-05-06 ENCOUNTER — Other Ambulatory Visit: Payer: Self-pay | Admitting: Obstetrics and Gynecology

## 2022-05-06 DIAGNOSIS — N923 Ovulation bleeding: Secondary | ICD-10-CM

## 2022-05-06 DIAGNOSIS — N939 Abnormal uterine and vaginal bleeding, unspecified: Secondary | ICD-10-CM

## 2022-05-06 NOTE — H&P (Signed)
Leslie [name] is a 46 y.o. female P: 2-0-0-2 who presents for hysteroscopy, dilatation, curettage and polypectomy because of intermenstrual bleeding. For the past 2 months the patient reports spotting before and after her period accompanied by cramping. Her actual periods will last for  5 days with Flex Disc change once a day. She will occasionally have  cramping but finds relief with Ibuprofen.  A pelvic ultrasound February 25, 2022 revealed: an anteverted uterus:  (volume 74 cc)  measuring 7.29 x 4.54 x 4.25 cm, endometrium: 8.47 mm with a 1.7 mm solitary hyperechoic homongenous nodule with a single feeding vessel suggestive of a polyp; right ovary-2.75 cm and left ovary-2.94 cm with a simple paraovarian cyst 1.2 cm.  She denies any pelvic pain, vaginitis symptoms, changes in bowel or bladder function or dyspareunia.  Given the ultrasound findings and her persistent symptoms the patient has decided to proceed with hysteroscopic removal of her endometrial polyp.   Past Medical History  OB History: G: 2; P: 2-0-0-2  GYN History: menarche: 46 YO;   Contraception: Partner Vasectomy; Denies history of abnormal PAP smear.  Last PAP smear: 2022.  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;  Denies tobacco use and occasionally uses alcohol   Medications: Valacyclovir 500 mg as directed   Allergies  Allergen Reactions   Diflucan [Fluconazole] Other (See Comments)    Wheezing and throat pressue    ROS: Denies headache, vision changes, nasal congestion, dysphagia, tinnitus, dizziness, hoarseness, cough,  chest pain, shortness of breath, nausea, vomiting, diarrhea,constipation,  urinary frequency, urgency  dysuria, hematuria, vaginitis symptoms, pelvic pain, swelling of joints,easy  bruising,  myalgias, arthralgias, skin rashes, unexplained weight loss and except as is mentioned in the history of present illness, patient's review of systems is otherwise negative.    Physical Exam  Bp: 120/78   Weight: 148;  Height: 5'8.5";   BMI: 22.2  Neck: supple without masses or thyromegaly Lungs: clear to auscultation Heart: regular rate and rhythm Abdomen: soft, non-tender and no organomegaly Pelvic:EGBUS- wnl; vagina-normal rugae; uterus-normal size, cervix without lesions or motion tenderness; adnexae-no tenderness or masses Extremities:  no clubbing, cyanosis or edema   Assesment: Intermenstrual Bleeding                      Endometrial Polyp   Disposition:  A discussion was held with patient regarding the indication for her procedure(s) along with the risks, which include but are not limited to: reaction to anesthesia, damage to adjacent organs, infection and excessive bleeding. The patient verbalized understanding of these risks and has consented to proceed with a Hysteroscopy, Dilatation, Curettage and Polypectomy at [facility] on May 07, 2022 at 11:30 a.m.   CSN# [id number]   Lorelle Macaluso J. Florene Glen, PA-C  for Dr. Dede Query. Rivard

## 2022-05-06 NOTE — Progress Notes (Signed)
Spoke w/ via phone for pre-op interview---[facility] needs dos----urine pregnancy POCT per anesthesia, surgeon orders pending as of 05/06/22.               Lab results------04/14/22 MRI brain, 01/09/22 PET scan COVID test -----patient states asymptomatic no test needed Arrive at -------0930 on Thursday, 05/07/22 NPO after MN NO Solid Food.  Clear liquids from MN until---0830 Med rec completed Medications to take morning of surgery -----NONE Diabetic medication -----n/a Patient instructed no nail polish to be worn day of surgery Patient instructed to bring photo id and insurance card day of surgery Patient aware to have Driver (ride ) / caregiver    for 24 hours after surgery - husband, Legrand Como Patient Special Instructions -----none Pre-Op special Istructions -----Requested orders via Epic IB on 05/06/22 from Dr. Cletis Media. Patient verbalized understanding of instructions that were given at this phone interview. Patient denies shortness of breath, chest pain, fever, cough at this phone interview.

## 2022-05-06 NOTE — H&P (View-Only) (Signed)
Leslie [name] is a 46 y.o. female P: 2-0-0-2 who presents for hysteroscopy, dilatation, curettage and polypectomy because of intermenstrual bleeding. For the past 2 months the patient reports spotting before and after her period accompanied by cramping. Her actual periods will last for  5 days with Flex Disc change once a day. She will occasionally have  cramping but finds relief with Ibuprofen.  A pelvic ultrasound February 25, 2022 revealed: an anteverted uterus:  (volume 74 cc)  measuring 7.29 x 4.54 x 4.25 cm, endometrium: 8.47 mm with a 1.7 mm solitary hyperechoic homongenous nodule with a single feeding vessel suggestive of a polyp; right ovary-2.75 cm and left ovary-2.94 cm with a simple paraovarian cyst 1.2 cm.  She denies any pelvic pain, vaginitis symptoms, changes in bowel or bladder function or dyspareunia.  Given the ultrasound findings and her persistent symptoms the patient has decided to proceed with hysteroscopic removal of her endometrial polyp.   Past Medical History  OB History: G: 2; P: 2-0-0-2  GYN History: menarche: 46 YO;   Contraception: Partner Vasectomy; Denies history of abnormal PAP smear.  Last PAP smear: 2022.  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;  Denies tobacco use and occasionally uses alcohol   Medications: Valacyclovir 500 mg as directed   Allergies  Allergen Reactions   Diflucan [Fluconazole] Other (See Comments)    Wheezing and throat pressue    ROS: Denies headache, vision changes, nasal congestion, dysphagia, tinnitus, dizziness, hoarseness, cough,  chest pain, shortness of breath, nausea, vomiting, diarrhea,constipation,  urinary frequency, urgency  dysuria, hematuria, vaginitis symptoms, pelvic pain, swelling of joints,easy  bruising,  myalgias, arthralgias, skin rashes, unexplained weight loss and except as is mentioned in the history of present illness, patient's review of systems is otherwise negative.    Physical Exam  Bp: 120/78   Weight: 148;  Height: 5'8.5";   BMI: 22.2  Neck: supple without masses or thyromegaly Lungs: clear to auscultation Heart: regular rate and rhythm Abdomen: soft, non-tender and no organomegaly Pelvic:EGBUS- wnl; vagina-normal rugae; uterus-normal size, cervix without lesions or motion tenderness; adnexae-no tenderness or masses Extremities:  no clubbing, cyanosis or edema   Assesment: Intermenstrual Bleeding                      Endometrial Polyp   Disposition:  A discussion was held with patient regarding the indication for her procedure(s) along with the risks, which include but are not limited to: reaction to anesthesia, damage to adjacent organs, infection and excessive bleeding. The patient verbalized understanding of these risks and has consented to proceed with a Hysteroscopy, Dilatation, Curettage and Polypectomy at [facility] on May 07, 2022 at 11:30 a.m.   CSN# [id number]   Mcihael Hinderman J. Florene Glen, PA-C  for Dr. Dede Query. Rivard

## 2022-05-07 ENCOUNTER — Ambulatory Visit (HOSPITAL_BASED_OUTPATIENT_CLINIC_OR_DEPARTMENT_OTHER): Payer: BC Managed Care – PPO | Admitting: Anesthesiology

## 2022-05-07 ENCOUNTER — Ambulatory Visit (HOSPITAL_BASED_OUTPATIENT_CLINIC_OR_DEPARTMENT_OTHER)
Admission: RE | Admit: 2022-05-07 | Discharge: 2022-05-07 | Disposition: A | Discharge status: Home / Self Care | Payer: BC Managed Care – PPO | Source: Ambulatory Visit | Attending: Obstetrics and Gynecology | Admitting: Obstetrics and Gynecology

## 2022-05-07 ENCOUNTER — Encounter: Payer: BC Managed Care – PPO | Admitting: Gastroenterology

## 2022-05-07 ENCOUNTER — Encounter (HOSPITAL_BASED_OUTPATIENT_CLINIC_OR_DEPARTMENT_OTHER)
Admission: RE | Disposition: A | Discharge status: Home / Self Care | Payer: Self-pay | Source: Ambulatory Visit | Attending: Obstetrics and Gynecology

## 2022-05-07 ENCOUNTER — Encounter (HOSPITAL_BASED_OUTPATIENT_CLINIC_OR_DEPARTMENT_OTHER): Payer: Self-pay | Admitting: Obstetrics and Gynecology

## 2022-05-07 ENCOUNTER — Other Ambulatory Visit: Payer: Self-pay

## 2022-05-07 DIAGNOSIS — C801 Malignant (primary) neoplasm, unspecified: Secondary | ICD-10-CM | POA: Diagnosis not present

## 2022-05-07 DIAGNOSIS — N840 Polyp of corpus uteri: Secondary | ICD-10-CM | POA: Diagnosis not present

## 2022-05-07 DIAGNOSIS — C7989 Secondary malignant neoplasm of other specified sites: Secondary | ICD-10-CM | POA: Diagnosis not present

## 2022-05-07 DIAGNOSIS — N939 Abnormal uterine and vaginal bleeding, unspecified: Secondary | ICD-10-CM | POA: Diagnosis not present

## 2022-05-07 DIAGNOSIS — N923 Ovulation bleeding: Secondary | ICD-10-CM | POA: Insufficient documentation

## 2022-05-07 DIAGNOSIS — Z01818 Encounter for other preprocedural examination: Secondary | ICD-10-CM

## 2022-05-07 HISTORY — DX: Irregular menstruation, unspecified: N92.6

## 2022-05-07 HISTORY — PX: HYSTEROSCOPY WITH D & C: SHX1775

## 2022-05-07 LAB — POCT PREGNANCY, URINE: Preg Test, Ur: NEGATIVE

## 2022-05-07 LAB — TYPE AND SCREEN
ABO/RH(D): O NEG
Antibody Screen: NEGATIVE

## 2022-05-07 SURGERY — DILATATION AND CURETTAGE /HYSTEROSCOPY
Anesthesia: General | Site: Vagina

## 2022-05-07 MED ORDER — OXYCODONE HCL 5 MG PO TABS: 5.0000 mg | ORAL_TABLET | Freq: Once | ORAL | Status: DC | PRN

## 2022-05-07 MED ORDER — DEXAMETHASONE SODIUM PHOSPHATE 4 MG/ML IJ SOLN
INTRAMUSCULAR | Status: DC | PRN
Start: 2022-05-07 — End: 2022-05-07
  Administered 2022-05-07: 10 mg via INTRAVENOUS

## 2022-05-07 MED ORDER — ACETAMINOPHEN 500 MG PO TABS
1000.0000 mg | ORAL_TABLET | Freq: Once | ORAL | Status: AC
  Administered 2022-05-07: 1000 mg via ORAL

## 2022-05-07 MED ORDER — ONDANSETRON HCL 4 MG/2ML IJ SOLN: 4.0000 mg | Freq: Once | INTRAMUSCULAR | Status: DC | PRN

## 2022-05-07 MED ORDER — PROPOFOL 10 MG/ML IV BOLUS
INTRAVENOUS | Status: AC
Start: 2022-05-07 — End: ?
  Filled 2022-05-07: qty 20

## 2022-05-07 MED ORDER — LIDOCAINE HCL (PF) 2 % IJ SOLN
INTRAMUSCULAR | Status: AC
  Filled 2022-05-07: qty 5

## 2022-05-07 MED ORDER — PROPOFOL 10 MG/ML IV BOLUS
INTRAVENOUS | Status: DC | PRN
  Administered 2022-05-07: 200 mg via INTRAVENOUS

## 2022-05-07 MED ORDER — POVIDONE-IODINE 10 % EX SWAB: 2.0000 "application " | Freq: Once | CUTANEOUS | Status: DC

## 2022-05-07 MED ORDER — MIDAZOLAM HCL 5 MG/5ML IJ SOLN
INTRAMUSCULAR | Status: DC | PRN
  Administered 2022-05-07: 2 mg via INTRAVENOUS

## 2022-05-07 MED ORDER — LACTATED RINGERS IV SOLN: INTRAVENOUS | Status: DC

## 2022-05-07 MED ORDER — ACETAMINOPHEN 500 MG PO TABS
ORAL_TABLET | ORAL | Status: AC
Start: 2022-05-07 — End: ?
  Filled 2022-05-07: qty 2

## 2022-05-07 MED ORDER — OXYCODONE HCL 5 MG/5ML PO SOLN: 5.0000 mg | Freq: Once | ORAL | Status: DC | PRN

## 2022-05-07 MED ORDER — CHLOROPROCAINE HCL 1 % IJ SOLN
INTRAMUSCULAR | Status: DC | PRN
  Administered 2022-05-07: 10 mL

## 2022-05-07 MED ORDER — FENTANYL CITRATE (PF) 100 MCG/2ML IJ SOLN: 25.0000 ug | INTRAMUSCULAR | Status: DC | PRN

## 2022-05-07 MED ORDER — LIDOCAINE HCL (CARDIAC) PF 100 MG/5ML IV SOSY
PREFILLED_SYRINGE | INTRAVENOUS | Status: DC | PRN
Start: 2022-05-07 — End: 2022-05-07
  Administered 2022-05-07: 100 mg via INTRAVENOUS

## 2022-05-07 MED ORDER — FENTANYL CITRATE (PF) 100 MCG/2ML IJ SOLN
INTRAMUSCULAR | Status: DC | PRN
  Administered 2022-05-07 (×2): 25 ug via INTRAVENOUS
  Administered 2022-05-07: 50 ug via INTRAVENOUS

## 2022-05-07 MED ORDER — AMISULPRIDE (ANTIEMETIC) 5 MG/2ML IV SOLN: 10.0000 mg | Freq: Once | INTRAVENOUS | Status: DC | PRN

## 2022-05-07 MED ORDER — SODIUM CHLORIDE 0.9 % IR SOLN
Status: DC | PRN
Start: 2022-05-07 — End: 2022-05-07
  Administered 2022-05-07: 3000 mL

## 2022-05-07 MED ORDER — ONDANSETRON HCL 4 MG/2ML IJ SOLN
INTRAMUSCULAR | Status: AC
  Filled 2022-05-07: qty 2

## 2022-05-07 MED ORDER — DEXAMETHASONE SODIUM PHOSPHATE 10 MG/ML IJ SOLN
INTRAMUSCULAR | Status: AC
Start: 2022-05-07 — End: ?
  Filled 2022-05-07: qty 1

## 2022-05-07 MED ORDER — EPHEDRINE SULFATE (PRESSORS) 50 MG/ML IJ SOLN
INTRAMUSCULAR | Status: DC | PRN
Start: 2022-05-07 — End: 2022-05-07
  Administered 2022-05-07 (×2): 10 mg via INTRAVENOUS

## 2022-05-07 MED ORDER — FENTANYL CITRATE (PF) 100 MCG/2ML IJ SOLN
INTRAMUSCULAR | Status: AC
  Filled 2022-05-07: qty 2

## 2022-05-07 MED ORDER — KETOROLAC TROMETHAMINE 30 MG/ML IJ SOLN
INTRAMUSCULAR | Status: DC | PRN
  Administered 2022-05-07: 30 mg via INTRAVENOUS

## 2022-05-07 MED ORDER — ONDANSETRON HCL 4 MG/2ML IJ SOLN
INTRAMUSCULAR | Status: DC | PRN
  Administered 2022-05-07: 4 mg via INTRAVENOUS

## 2022-05-07 MED ORDER — EPHEDRINE 5 MG/ML INJ
INTRAVENOUS | Status: AC
  Filled 2022-05-07: qty 5

## 2022-05-07 MED ORDER — MIDAZOLAM HCL 2 MG/2ML IJ SOLN
INTRAMUSCULAR | Status: AC
  Filled 2022-05-07: qty 2

## 2022-05-07 SURGICAL SUPPLY — 23 items
CLOTH BEACON ORANGE TIMEOUT ST (SAFETY) ×2 IMPLANT
DEVICE MYOSURE LITE (MISCELLANEOUS) ×1 IMPLANT
DRSG TELFA 3X8 NADH (GAUZE/BANDAGES/DRESSINGS) ×2 IMPLANT
GAUZE 4X4 16PLY ~~LOC~~+RFID DBL (SPONGE) ×2 IMPLANT
GLOVE BIO SURGEON STRL SZ 6 (GLOVE) ×1 IMPLANT
GLOVE BIOGEL PI IND STRL 6 (GLOVE) IMPLANT
GLOVE BIOGEL PI IND STRL 7.0 (GLOVE) IMPLANT
GLOVE BIOGEL PI IND STRL 7.5 (GLOVE) IMPLANT
GLOVE BIOGEL PI INDICATOR 6 (GLOVE) ×1
GLOVE BIOGEL PI INDICATOR 7.0 (GLOVE) ×2
GLOVE BIOGEL PI INDICATOR 7.5 (GLOVE) ×2
GLOVE ECLIPSE 6.5 STRL STRAW (GLOVE) ×4 IMPLANT
GLOVE SURG POLYISO LF SZ6.5 (GLOVE) ×1 IMPLANT
GOWN STRL REUS W/TWL LRG LVL3 (GOWN DISPOSABLE) ×4 IMPLANT
GOWN STRL REUS W/TWL XL LVL3 (GOWN DISPOSABLE) ×2 IMPLANT
IV NS IRRIG 3000ML ARTHROMATIC (IV SOLUTION) ×2 IMPLANT
KIT PROCEDURE FLUENT (KITS) ×2 IMPLANT
KIT TURNOVER CYSTO (KITS) ×2 IMPLANT
PACK VAGINAL MINOR WOMEN LF (CUSTOM PROCEDURE TRAY) ×2 IMPLANT
PAD DRESSING TELFA 3X8 NADH (GAUZE/BANDAGES/DRESSINGS) ×1 IMPLANT
PAD OB MATERNITY 4.3X12.25 (PERSONAL CARE ITEMS) ×2 IMPLANT
PAD PREP 24X48 CUFFED NSTRL (MISCELLANEOUS) ×2 IMPLANT
SEAL ROD LENS SCOPE MYOSURE (ABLATOR) ×2 IMPLANT

## 2022-05-07 NOTE — Anesthesia Procedure Notes (Signed)
Procedure Name: LMA Insertion Date/Time: 05/07/2022 11:47 AM Performed by: Justice Rocher, CRNA Pre-anesthesia Checklist: Patient identified, Emergency Drugs available, Suction available, Patient being monitored and Timeout performed Patient Re-evaluated:Patient Re-evaluated prior to induction Oxygen Delivery Method: Circle system utilized Preoxygenation: Pre-oxygenation with 100% oxygen Induction Type: IV induction Ventilation: Mask ventilation without difficulty LMA: LMA inserted LMA Size: 4.0 Number of attempts: 1 Airway Equipment and Method: Bite block Placement Confirmation: positive ETCO2, breath sounds checked- equal and bilateral and CO2 detector Tube secured with: Tape Dental Injury: Teeth and Oropharynx as per pre-operative assessment

## 2022-05-07 NOTE — Transfer of Care (Signed)
Immediate Anesthesia Transfer of Care Note  Patient: Leslie [name]  Procedure(s) Performed: Procedure(s) (LRB): DILATATION AND CURETTAGE /HYSTEROSCOPY/ POLYPECTOMY (N/A)  Patient Location: PACU  Anesthesia Type: General  Level of Consciousness: awake, sedated, patient cooperative and responds to stimulation  Airway & Oxygen Therapy: Patient Spontanous Breathing and Patient connected to [place] 02 and soft FM   Post-op Assessment: Report given to PACU RN, Post -op Vital signs reviewed and stable and Patient moving all extremities  Post vital signs: Reviewed and stable  Complications: No apparent anesthesia complications

## 2022-05-07 NOTE — Discharge Instructions (Addendum)
POST-OPERATIVE INSTRUCTIONS TO PATIENT  Call [facility] at [phone]  for excessive pain, bleeding or temperature greater than or equal to 100.4 degrees (orally).    No driving for 24 hours No sexual activity until bleeding has stopped.  Pain management:  Use Ibuprofen 600 mg or Acetaminophen 1000 mg every 6 hours as needed. Use your pain medication as needed to maintain a pain level at or below 3/10      Diet: normal  Bathing: may shower day after surgery  Wound Care: n/a  Return to Dr. Cletis Media on 05/21/22 at 2:15 pm  Return to work: 05/12/22   Delsa Bern MD   Post Anesthesia Home Care Instructions  Activity: Get plenty of rest for the remainder of the day. A responsible individual must stay with you for 24 hours following the procedure.  For the next 24 hours, DO NOT: -Drive a car -Paediatric nurse -Drink alcoholic beverages -Take any medication unless instructed by your physician -Make any legal decisions or sign important papers.  Meals: Start with liquid foods such as gelatin or soup. Progress to regular foods as tolerated. Avoid greasy, spicy, heavy foods. If nausea and/or vomiting occur, drink only clear liquids until the nausea and/or vomiting subsides. Call your physician if vomiting continues.  Special Instructions/Symptoms: Your throat may feel dry or sore from the anesthesia or the breathing tube placed in your throat during surgery. If this causes discomfort, gargle with warm salt water. The discomfort should disappear within 24 hours.  May take Tylenol as needed for cramping/pain beginning at 4 PM. May take Ibuprofen as needed for cramping/discomfort beginning at 6 PM.

## 2022-05-07 NOTE — Anesthesia Postprocedure Evaluation (Signed)
Anesthesia Post Note  Patient: Leslie [name]  Procedure(s) Performed: DILATATION AND CURETTAGE /HYSTEROSCOPY/ POLYPECTOMY (Vagina )     Patient location during evaluation: PACU Anesthesia Type: General Level of consciousness: awake and alert Pain management: pain level controlled Vital Signs Assessment: post-procedure vital signs reviewed and stable Respiratory status: spontaneous breathing, nonlabored ventilation and respiratory function stable Cardiovascular status: blood pressure returned to baseline and stable Postop Assessment: no apparent nausea or vomiting Anesthetic complications: no   No notable events documented.  Last Vitals:  Vitals:   05/07/22 1245 05/07/22 1300  BP: 106/68 109/71  Pulse: (!) 58 (!) 58  Resp: 13 13  Temp:    SpO2: 100% 100%    Last Pain:  Vitals:   05/07/22 1245  TempSrc:   PainSc: 0-No pain                 Lidia Collum

## 2022-05-07 NOTE — Op Note (Signed)
Preop diagnosis: abnormal uterine bleeding with suspicion of endometrial polyp  Postop diagnosis: endometrial polyp  Anesthesia: IV sedation  Anesthesiologist: Dr. Kerin Perna  Procedure: Hysteroscopy, resection of endometrial polyp and curettage  Surgeon: Dr. Katharine Look Nayel Purdy  Procedure: After being informed of the planned procedure with possible complications including bleeding, infection and uterine perforation, informed consent was obtained and patient was taken to or #4.  She was given IV sedation anesthesia without complication. She was placed in a dorsal decubitus position, prepped and draped in the sterile fashion. Pelvic exam reveals anteverted uterus with 2 normal adnexa.  A weighted speculum is inserted in the vagina. The cervix was grasped with a tenaculum forcep placed on the anterior lip.We proceed with a paracervical block using 1% Nesacaine, 10 cc. Uterus is sounded at 7. The cervix is then easily dilated using Hegar dilator until # 21. This allows for easy placement of an operative hysteroscope. With perfusion of NS at a maximum pressure of 90 mmHg, we are able to evaluate the entire uterine cavity.  Observation: 1.5 cm endometrial polyp in the right LUS, thin endometrium otherwise, 2 tubal ostia  Using Myosure Lite, we proceed with the resection of the polyp. We then removed our instrumentation. Using a sharp curette, we proceed with curettage of the endometrial cavity which returns a small amount of normal-appearing endometrium.  Instruments are then removed. Instrument and sponge count is complete x2. Estimated blood loss is minimal. Water deficit is 150 cc of NS.  The procedure is very well tolerated by the patient who is taken to recovery room in a well and stable condition.  Specimen: Endometrial polyps and endometrial curettings sent to pathology.

## 2022-05-07 NOTE — Interval H&P Note (Signed)
History and Physical Interval Note:  05/07/2022 11:19 AM  Leslie [name]  has presented today for surgery, with the diagnosis of endometrial polyp.  The various methods of treatment have been discussed with the patient and family. After consideration of risks, benefits and other options for treatment, the patient has consented to  Procedure(s): DILATATION AND CURETTAGE /HYSTEROSCOPY/ POLYPECTOMY (N/A) as a surgical intervention.  The patient's history has been reviewed, patient examined, no change in status, stable for surgery.  I have reviewed the patient's chart and labs.  Questions were answered to the patient's satisfaction.     Katharine Look A Eathen Budreau

## 2022-05-07 NOTE — Anesthesia Preprocedure Evaluation (Signed)
Anesthesia Evaluation  Patient identified by MRN, date of birth, ID band Patient awake    Reviewed: Allergy & Precautions, NPO status , Patient's Chart, lab work & pertinent test results  History of Anesthesia Complications Negative for: history of anesthetic complications  Airway Mallampati: II  TM Distance: >3 FB Neck ROM: Full    Dental  (+) Teeth Intact, Dental Advisory Given   Pulmonary neg pulmonary ROS,  H/o XRT to left lung for metastatic lesion   Pulmonary exam normal        Cardiovascular negative cardio ROS Normal cardiovascular exam     Neuro/Psych S/p craniotomy/XRT for brain met    GI/Hepatic negative GI ROS, Neg liver ROS,   Endo/Other  negative endocrine ROS  Renal/GU negative Renal ROS  negative genitourinary   Musculoskeletal negative musculoskeletal ROS (+)   Abdominal   Peds  Hematology Metastatic melanoma   Anesthesia Other Findings   Reproductive/Obstetrics endometrial polyp                             Anesthesia Physical Anesthesia Plan  ASA: 3  Anesthesia Plan: General   Post-op Pain Management: Tylenol PO (pre-op)* and Toradol IV (intra-op)*   Induction: Intravenous  PONV Risk Score and Plan: 3 and Ondansetron, Dexamethasone, Midazolam and Treatment may vary due to age or medical condition  Airway Management Planned: LMA  Additional Equipment: None  Intra-op Plan:   Post-operative Plan: Extubation in OR  Informed Consent: I have reviewed the patients History and Physical, chart, labs and discussed the procedure including the risks, benefits and alternatives for the proposed anesthesia with the patient or authorized representative who has indicated his/her understanding and acceptance.     Dental advisory given  Plan Discussed with:   Anesthesia Plan Comments:         Anesthesia Quick Evaluation

## 2022-05-08 ENCOUNTER — Encounter (HOSPITAL_BASED_OUTPATIENT_CLINIC_OR_DEPARTMENT_OTHER): Payer: Self-pay | Admitting: Obstetrics and Gynecology

## 2022-05-08 LAB — SURGICAL PATHOLOGY

## 2022-05-19 DIAGNOSIS — M9903 Segmental and somatic dysfunction of lumbar region: Secondary | ICD-10-CM | POA: Diagnosis not present

## 2022-05-19 DIAGNOSIS — M5386 Other specified dorsopathies, lumbar region: Secondary | ICD-10-CM | POA: Diagnosis not present

## 2022-05-19 DIAGNOSIS — S29011A Strain of muscle and tendon of front wall of thorax, initial encounter: Secondary | ICD-10-CM | POA: Diagnosis not present

## 2022-05-21 DIAGNOSIS — M9903 Segmental and somatic dysfunction of lumbar region: Secondary | ICD-10-CM | POA: Diagnosis not present

## 2022-05-21 DIAGNOSIS — M5386 Other specified dorsopathies, lumbar region: Secondary | ICD-10-CM | POA: Diagnosis not present

## 2022-05-21 DIAGNOSIS — S29011A Strain of muscle and tendon of front wall of thorax, initial encounter: Secondary | ICD-10-CM | POA: Diagnosis not present

## 2022-05-26 DIAGNOSIS — Z09 Encounter for follow-up examination after completed treatment for conditions other than malignant neoplasm: Secondary | ICD-10-CM | POA: Diagnosis not present

## 2022-06-03 ENCOUNTER — Encounter: Payer: Self-pay | Admitting: Internal Medicine

## 2022-06-03 DIAGNOSIS — Z1211 Encounter for screening for malignant neoplasm of colon: Secondary | ICD-10-CM

## 2022-06-05 ENCOUNTER — Encounter: Payer: Self-pay | Admitting: Internal Medicine

## 2022-06-05 ENCOUNTER — Telehealth: Payer: Self-pay

## 2022-06-08 ENCOUNTER — Encounter: Payer: Self-pay | Admitting: Gastroenterology

## 2022-06-10 ENCOUNTER — Other Ambulatory Visit: Payer: Self-pay

## 2022-06-11 ENCOUNTER — Other Ambulatory Visit: Payer: Self-pay

## 2022-06-11 ENCOUNTER — Inpatient Hospital Stay: Payer: BC Managed Care – PPO | Attending: Internal Medicine

## 2022-06-11 DIAGNOSIS — C439 Malignant melanoma of skin, unspecified: Secondary | ICD-10-CM | POA: Insufficient documentation

## 2022-06-11 DIAGNOSIS — C7800 Secondary malignant neoplasm of unspecified lung: Secondary | ICD-10-CM | POA: Diagnosis not present

## 2022-06-11 DIAGNOSIS — C7931 Secondary malignant neoplasm of brain: Secondary | ICD-10-CM | POA: Insufficient documentation

## 2022-06-11 LAB — CBC WITH DIFFERENTIAL (CANCER CENTER ONLY)
Abs Immature Granulocytes: 0.01 10*3/uL (ref 0.00–0.07)
Basophils Absolute: 0 10*3/uL (ref 0.0–0.1)
Basophils Relative: 1 %
Eosinophils Absolute: 0.1 10*3/uL (ref 0.0–0.5)
Eosinophils Relative: 3 %
HCT: 38.7 % (ref 36.0–46.0)
Hemoglobin: 13.2 g/dL (ref 12.0–15.0)
Immature Granulocytes: 0 %
Lymphocytes Relative: 22 %
Lymphs Abs: 1 10*3/uL (ref 0.7–4.0)
MCH: 31.7 pg (ref 26.0–34.0)
MCHC: 34.1 g/dL (ref 30.0–36.0)
MCV: 93 fL (ref 80.0–100.0)
Monocytes Absolute: 0.5 10*3/uL (ref 0.1–1.0)
Monocytes Relative: 12 %
Neutro Abs: 2.7 10*3/uL (ref 1.7–7.7)
Neutrophils Relative %: 62 %
Platelet Count: 218 10*3/uL (ref 150–400)
RBC: 4.16 MIL/uL (ref 3.87–5.11)
RDW: 12.8 % (ref 11.5–15.5)
WBC Count: 4.4 10*3/uL (ref 4.0–10.5)
nRBC: 0 % (ref 0.0–0.2)

## 2022-06-11 LAB — CMP (CANCER CENTER ONLY)
ALT: 22 U/L (ref 0–44)
AST: 27 U/L (ref 15–41)
Albumin: 4.1 g/dL (ref 3.5–5.0)
Alkaline Phosphatase: 62 U/L (ref 38–126)
Anion gap: 3 — ABNORMAL LOW (ref 5–15)
BUN: 15 mg/dL (ref 6–20)
CO2: 30 mmol/L (ref 22–32)
Calcium: 9.6 mg/dL (ref 8.9–10.3)
Chloride: 106 mmol/L (ref 98–111)
Creatinine: 1.12 mg/dL — ABNORMAL HIGH (ref 0.44–1.00)
GFR, Estimated: >60 mL/min (ref >60)
Glucose, Bld: 78 mg/dL (ref 70–99)
Potassium: 4.5 mmol/L (ref 3.5–5.1)
Sodium: 139 mmol/L (ref 135–145)
Total Bilirubin: 0.4 mg/dL (ref 0.3–1.2)
Total Protein: 6.3 g/dL — ABNORMAL LOW (ref 6.5–8.1)

## 2022-06-12 ENCOUNTER — Ambulatory Visit (HOSPITAL_COMMUNITY)
Admission: RE | Admit: 2022-06-12 | Discharge: 2022-06-12 | Disposition: A | Discharge status: Home / Self Care | Payer: BC Managed Care – PPO | Source: Ambulatory Visit | Attending: Internal Medicine | Admitting: Internal Medicine

## 2022-06-12 DIAGNOSIS — C7931 Secondary malignant neoplasm of brain: Secondary | ICD-10-CM | POA: Insufficient documentation

## 2022-06-12 DIAGNOSIS — C439 Malignant melanoma of skin, unspecified: Secondary | ICD-10-CM | POA: Diagnosis not present

## 2022-06-12 MED ORDER — SODIUM CHLORIDE (PF) 0.9 % IJ SOLN
INTRAMUSCULAR | Status: AC
  Filled 2022-06-12: qty 50

## 2022-06-12 MED ORDER — IOHEXOL 300 MG/ML  SOLN
100.0000 mL | Freq: Once | INTRAMUSCULAR | Status: AC | PRN
  Administered 2022-06-12: 100 mL via INTRAVENOUS

## 2022-06-17 ENCOUNTER — Other Ambulatory Visit: Payer: BC Managed Care – PPO

## 2022-06-18 ENCOUNTER — Inpatient Hospital Stay: Payer: BC Managed Care – PPO | Attending: Internal Medicine | Admitting: Internal Medicine

## 2022-06-18 ENCOUNTER — Other Ambulatory Visit: Payer: Self-pay

## 2022-06-18 VITALS — BP 119/76 | HR 70 | Temp 97.4°F | Resp 18 | Wt 147.5 lb

## 2022-06-18 DIAGNOSIS — C439 Malignant melanoma of skin, unspecified: Secondary | ICD-10-CM | POA: Insufficient documentation

## 2022-06-18 DIAGNOSIS — C7800 Secondary malignant neoplasm of unspecified lung: Secondary | ICD-10-CM | POA: Diagnosis not present

## 2022-06-18 DIAGNOSIS — C7802 Secondary malignant neoplasm of left lung: Secondary | ICD-10-CM

## 2022-06-18 DIAGNOSIS — Z923 Personal history of irradiation: Secondary | ICD-10-CM | POA: Insufficient documentation

## 2022-06-18 DIAGNOSIS — Z9221 Personal history of antineoplastic chemotherapy: Secondary | ICD-10-CM | POA: Diagnosis not present

## 2022-06-18 DIAGNOSIS — C7931 Secondary malignant neoplasm of brain: Secondary | ICD-10-CM | POA: Diagnosis not present

## 2022-06-18 NOTE — Progress Notes (Signed)
[facility] [phone]   [phone]  OFFICE PROGRESS NOTE  Isaac Bliss, Rayford Halsted, MD [place] [address]  DIAGNOSIS: Metastatic high-grade neoplasm consistent with metastatic malignant melanoma based on the most recent pathology report from [facility] in October 2019, presented with large left left upper/left lower lobe mass with a hypermetabolic AP window lymph node as well as solitary metastatic brain lesion diagnosed in May 2019.  Biomarker Findings Microsatellite status - MS-Stable Tumor Mutational Burden - TMB-Intermediate (16 Muts/Mb) Genomic Findings For a complete list of the genes assayed, please refer to the Appendix. CD274 (PD-L1) amplification NRAS Q61R PDCD1LG2 (PD-L2) amplification MYC amplification EPHB1 amplification JAK2 amplification RB1 Q93*, S496* TERT promoter -146C>T TP53 C275W   PRIOR THERAPY: 1) left temporal craniotomy with resection of tumor with intraoperative stereotactic guidance for volumetric resection under the care of Dr. Annette Stable on 04/29/2018. 2) First-line treatment with immunotherapy with Keytruda 200 mg IV every 3 weeks.  First dose June 02, 2018.  Status post 3 cycles.  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 3) palliative radiotherapy to the left lung mass under the care of Dr. Lisbeth Renshaw completed on 08/24/2018. 4) Resuming her treatment again with Keytruda 200 mg IV every 3 weeks, first dose 09/08/2018.  Status post 2 cycles. 5) Treatment with immunotherapy with ipilimumab 3 mg/KG and nivolumab 1 mg/KG every 3 weeks for the first 4 cycles followed by maintenance nivolumab 240 mg IV every 2 weeks.  First dose of this treatment October 26, 2018.  Status post 40 cycles.  Ipilimumab was discontinued after cycle #2 for the significant liver dysfunction.  Starting from cycle #3 the patient is on treatment with single agent  nivolumab. Starting from cycle #39 the patient is switching to nivolumab 480 mg IV every 4 weeks.  Her treatment is currently on hold since December 02, 2020.  CURRENT THERAPY: Observation.  INTERVAL HISTORY: Leslie [name] 46 y.o. female returns to the clinic today for follow-up visit.  The patient is feeling fine today with no concerning complaints except for the persistent pain on the left side of the chest from the surgical scar.  She feels that there is something restricting her ability to exercise and do weight lifting in that area.  She also has some concern and pain and the craniotomy scar.  Otherwise she is feeling fine and still very active.  She has no significant fatigue or weakness.  She has no shortness of breath, cough or hemoptysis.  She has no recent weight loss or night sweats.  She has no headache or visual changes.  She has been on observation since December 2021.  The patient had repeat CT scan of the chest, abdomen and pelvis performed recently and she is here for evaluation and discussion of her scan results.  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 Patient follows with Dr. Curt Bears at [facility] as of 05/06/22 was on 01/12/22 in [place].   Migraines    hx of migraines when  pt was younger   Pneumothorax 12/2020   Patient had a chest tube/drain for about 4 days per pt.   Yeast infection     ALLERGIES:  is allergic to diflucan [fluconazole].  MEDICATIONS:  Current Outpatient Medications  Medication Sig Dispense Refill   ADVANCED EVENING PRIMROSE OIL PO  Take by mouth daily.     AMERICAN GINSENG PO Take 1,500-3,000 mg by mouth daily. Depending on level of fatigue     Amino Acids (L-CARNITINE) LIQD Take 2,000 mg by mouth daily. Liquid L-Carnitine 2000 mg     Coenzyme Q10 (COQ-10) 100 MG CAPS Take 1 tablet by mouth daily.     CREATINE MONOHYDRATE PO Take 6 g by mouth daily. Creatine MonoHydrate 6 g (on training cycle)     Glucosamine-Chondroit-Vit C-Mn (GLUCOSAMINE 1500 COMPLEX PO) Take 1 tablet by mouth daily. Vegan CMO Glucosamine     Lysine POWD 1,600 mg by Does not apply route daily. L-Lysine Powder 1600 mg     Methylsulfonylmethane (MSM) 1000 MG CAPS Take 4 capsules by mouth daily. 4000 mg (1/2 before workout and 1/2 afterwards)     Multiple Vitamins-Minerals (ZINC PO) Take 1 tablet by mouth daily. Raw Zinc 30 mg (cycle every other week)     NON FORMULARY Take 1 Dose by mouth as needed. Cordyceps 750 mg     NON FORMULARY Take 1 Dose by mouth daily. Curcumin Phytosome (Bioperrine) 3000 mg     SM Omega-3-6-9 Fatty Acids CAPS Take by mouth.     No current facility-administered medications for this visit.    SURGICAL HISTORY:  Past Surgical History:  Procedure Laterality Date   APPLICATION OF CRANIAL NAVIGATION Left 04/29/2018   Procedure: APPLICATION OF CRANIAL NAVIGATION;  Surgeon: Earnie Larsson, MD;  Location: [facility];  Service: Neurosurgery;  Laterality: Left;   CRANIOTOMY Left 04/29/2018   Procedure: LEFT CRANIOTOMY FOR  TUMOR BRAIN LAB;  Surgeon: Earnie Larsson, MD;  Location: [facility];  Service: Neurosurgery;  Laterality: Left;   HYSTEROSCOPY WITH D & C N/A 05/07/2022   Procedure: DILATATION AND CURETTAGE /HYSTEROSCOPY/ POLYPECTOMY;  Surgeon: Delsa Bern, MD;  Location: [facility];  Service: Gynecology;  Laterality: N/A;   WISDOM TOOTH EXTRACTION  1992    REVIEW OF SYSTEMS:  Constitutional: negative Eyes: negative Ears, nose, mouth, throat, and face: negative Respiratory: positive for pleurisy/chest pain Cardiovascular:  negative Gastrointestinal: negative Genitourinary:negative Integument/breast: negative Hematologic/lymphatic: negative Musculoskeletal:negative Neurological: negative Behavioral/Psych: positive for anxiety Endocrine: negative Allergic/Immunologic: negative   PHYSICAL EXAMINATION: General appearance: alert, cooperative, and no distress Head: Normocephalic, without obvious abnormality, atraumatic Neck: no adenopathy, no JVD, supple, symmetrical, trachea midline, and thyroid not enlarged, symmetric, no tenderness/mass/nodules Lymph nodes: Cervical, supraclavicular, and axillary nodes normal. Resp: clear to auscultation bilaterally Back: negative, symmetric, no curvature. ROM normal. No CVA tenderness. Cardio: regular rate and rhythm, S1, S2 normal, no murmur, click, rub or gallop GI: soft, non-tender; bowel sounds normal; no masses,  no organomegaly Extremities: extremities normal, atraumatic, no cyanosis or edema Neurologic: Alert and oriented X 3, normal strength and tone. Normal symmetric reflexes. Normal coordination and gait  ECOG PERFORMANCE STATUS: 1 - Symptomatic but completely ambulatory  Blood pressure 119/76, pulse 70, temperature (!) 97.4 F (36.3 C), temperature source Tympanic, resp. rate 18, weight 147 lb 8 oz (66.9 kg), last menstrual period 05/28/2022, SpO2 100 %.  LABORATORY DATA: Lab Results  Component Value Date   WBC 4.4 06/11/2022   HGB 13.2 06/11/2022   HCT 38.7 06/11/2022  MCV 93.0 06/11/2022   PLT 218 06/11/2022      Chemistry      Component Value Date/Time   NA 139 06/11/2022 0808   K 4.5 06/11/2022 0808   CL 106 06/11/2022 0808   CO2 30 06/11/2022 0808   BUN 15 06/11/2022 0808   CREATININE 1.12 (H) 06/11/2022 0808      Component Value Date/Time   CALCIUM 9.6 06/11/2022 0808   ALKPHOS 62 06/11/2022 0808   AST 27 06/11/2022 0808   ALT 22 06/11/2022 0808   BILITOT 0.4 06/11/2022 0786       RADIOGRAPHIC STUDIES:    ASSESSMENT AND PLAN:  This is a very pleasant 46 years old white female with highly suspicious metastatic malignant melanoma presented with large mass in the left upper/left lower lobe and mediastinal lymphadenopathy as well as solitary brain metastasis status post left temporal craniotomy and resection of tumor on 04/29/2018 and she is recovering well from her surgery. The patient completed stereotactic radiotherapy to the resection cavity next week under the care of Dr. Lisbeth Renshaw. The patient underwent treatment with immunotherapy with Keytruda 200 mg IV every 3 weeks status post 3 cycles.   Her scan after cycle #3 showed enlargement of the left upper lobe lung mass.  This was also suspicious for pseudo-progression on immunotherapy.  The patient was started on a palliative course of radiotherapy to the left upper lobe lung mass and she tolerated this treatment fairly well.  She was seen by Dr. Emelda Brothers at [facility] and she recommended for the patient to resume her treatment with [facility] for now until she undergoes further molecular studies.  The patient was treated with 2 more cycles of Keytruda and tolerated the treatment well. Repeat CT scan of the chest, abdomen and pelvis at that time showed some improvement in the left upper lobe lung mass but there is still concern about pericardial invasion. She is still not a good candidate for surgical resection because of the pericardial invasion. The final pathology report and recommendation from [facility] was consistent with metastatic melanoma and the recommendation is to switch the patient to a combination immunotherapy with Ipilumumab and nivolumab. The patient underwent treatment with immunotherapy with ipilimumab and nivolumab status post 2 cycles.  Her treatment was on hold for more than 6 weeks secondary to grade 3 hepatic dysfunction.  She had improvement of her liver enzyme after a prolonged treatment with a steroid with a tapering schedule. She  resumed her treatment again with single agent nivolumab.  She is status post 40 cycles of treatment. She continues to tolerate her treatment well with no concerning adverse effects. Starting from cycle #39 the patient will be treated with nivolumab 480 mg IV every 4 weeks. The patient is currently on observation since her last dose on December 02, 2020. Her last PET scan on January 09, 2022 showed no concerning findings for disease progression. The patient has been on observation and feeling well with no concerning complaints except for the stretching pain on the left side of the chest from the previous surgical scar.  She is worried about this area and would like to be seen by the pain management clinic. I will refer her to Dr. Johnny Bridge at the [facility] in [facility] for evaluation and recommendation regarding her condition. The patient had repeat CT scan of the chest, abdomen and pelvis performed recently.  I personally and independently reviewed the scans and discussed the results with the patient today.  Her scan showed no concerning findings for disease recurrence or metastasis. I recommended for her to continue on observation with repeat CT scan of the chest, abdomen and pelvis in 6 months. The patient was advised to call immediately if she has any other concerning symptoms in the interval. The total time spent in the appointment was 40 minutes.  The patient voices understanding of current disease status and treatment options and is in agreement with the current care plan. All questions were answered. The patient knows to call the clinic with any problems, questions or concerns. We can certainly see the patient much sooner if necessary.  Disclaimer: This note was dictated with voice recognition software. Similar sounding words can inadvertently be transcribed and may not be corrected upon review.

## 2022-06-19 ENCOUNTER — Telehealth: Payer: Self-pay

## 2022-06-19 NOTE — Telephone Encounter (Signed)
Faxed referral to [facility] - Dr. Johnny Bridge  Received FAX confirmation that it was delivered.  FAX: [phone] Phone: [phone]

## 2022-06-20 DIAGNOSIS — Z1211 Encounter for screening for malignant neoplasm of colon: Secondary | ICD-10-CM | POA: Diagnosis not present

## 2022-06-20 LAB — COLOGUARD: Cologuard: NEGATIVE

## 2022-06-24 ENCOUNTER — Encounter: Payer: BC Managed Care – PPO | Admitting: Gastroenterology

## 2022-06-25 DIAGNOSIS — M5382 Other specified dorsopathies, cervical region: Secondary | ICD-10-CM | POA: Diagnosis not present

## 2022-06-25 DIAGNOSIS — S29011A Strain of muscle and tendon of front wall of thorax, initial encounter: Secondary | ICD-10-CM | POA: Diagnosis not present

## 2022-06-25 DIAGNOSIS — M9901 Segmental and somatic dysfunction of cervical region: Secondary | ICD-10-CM | POA: Diagnosis not present

## 2022-06-25 LAB — COLOGUARD: COLOGUARD: NEGATIVE

## 2022-07-02 DIAGNOSIS — M9901 Segmental and somatic dysfunction of cervical region: Secondary | ICD-10-CM | POA: Diagnosis not present

## 2022-07-02 DIAGNOSIS — M5382 Other specified dorsopathies, cervical region: Secondary | ICD-10-CM | POA: Diagnosis not present

## 2022-07-02 DIAGNOSIS — S29011A Strain of muscle and tendon of front wall of thorax, initial encounter: Secondary | ICD-10-CM | POA: Diagnosis not present

## 2022-07-03 ENCOUNTER — Encounter: Payer: Self-pay | Admitting: Internal Medicine

## 2022-07-03 DIAGNOSIS — G588 Other specified mononeuropathies: Secondary | ICD-10-CM | POA: Diagnosis not present

## 2022-07-03 DIAGNOSIS — Z79899 Other long term (current) drug therapy: Secondary | ICD-10-CM | POA: Diagnosis not present

## 2022-07-03 DIAGNOSIS — R0789 Other chest pain: Secondary | ICD-10-CM | POA: Diagnosis not present

## 2022-07-03 DIAGNOSIS — Z5181 Encounter for therapeutic drug level monitoring: Secondary | ICD-10-CM | POA: Diagnosis not present

## 2022-07-03 DIAGNOSIS — M62838 Other muscle spasm: Secondary | ICD-10-CM | POA: Diagnosis not present

## 2022-07-03 DIAGNOSIS — Z923 Personal history of irradiation: Secondary | ICD-10-CM | POA: Diagnosis not present

## 2022-07-06 ENCOUNTER — Other Ambulatory Visit: Payer: Self-pay

## 2022-07-10 ENCOUNTER — Other Ambulatory Visit: Payer: BC Managed Care – PPO

## 2022-07-13 ENCOUNTER — Ambulatory Visit: Payer: BC Managed Care – PPO | Admitting: Internal Medicine

## 2022-07-14 ENCOUNTER — Other Ambulatory Visit: Payer: Self-pay

## 2022-07-27 DIAGNOSIS — Z1239 Encounter for other screening for malignant neoplasm of breast: Secondary | ICD-10-CM | POA: Diagnosis not present

## 2022-07-27 DIAGNOSIS — Z124 Encounter for screening for malignant neoplasm of cervix: Secondary | ICD-10-CM | POA: Diagnosis not present

## 2022-07-27 DIAGNOSIS — Z1231 Encounter for screening mammogram for malignant neoplasm of breast: Secondary | ICD-10-CM | POA: Diagnosis not present

## 2022-07-27 DIAGNOSIS — Z01419 Encounter for gynecological examination (general) (routine) without abnormal findings: Secondary | ICD-10-CM | POA: Diagnosis not present

## 2022-07-27 DIAGNOSIS — Z1211 Encounter for screening for malignant neoplasm of colon: Secondary | ICD-10-CM | POA: Diagnosis not present

## 2022-07-28 ENCOUNTER — Encounter: Payer: Self-pay | Admitting: Internal Medicine

## 2022-07-30 ENCOUNTER — Encounter: Payer: Self-pay | Admitting: Radiation Oncology

## 2022-07-30 ENCOUNTER — Other Ambulatory Visit: Payer: Self-pay | Admitting: Obstetrics and Gynecology

## 2022-07-30 DIAGNOSIS — R222 Localized swelling, mass and lump, trunk: Secondary | ICD-10-CM

## 2022-07-31 DIAGNOSIS — Z923 Personal history of irradiation: Secondary | ICD-10-CM | POA: Diagnosis not present

## 2022-07-31 DIAGNOSIS — M792 Neuralgia and neuritis, unspecified: Secondary | ICD-10-CM | POA: Diagnosis not present

## 2022-07-31 DIAGNOSIS — R0789 Other chest pain: Secondary | ICD-10-CM | POA: Diagnosis not present

## 2022-08-06 DIAGNOSIS — M5382 Other specified dorsopathies, cervical region: Secondary | ICD-10-CM | POA: Diagnosis not present

## 2022-08-06 DIAGNOSIS — M9901 Segmental and somatic dysfunction of cervical region: Secondary | ICD-10-CM | POA: Diagnosis not present

## 2022-08-06 DIAGNOSIS — S29011A Strain of muscle and tendon of front wall of thorax, initial encounter: Secondary | ICD-10-CM | POA: Diagnosis not present

## 2022-08-07 DIAGNOSIS — G588 Other specified mononeuropathies: Secondary | ICD-10-CM | POA: Diagnosis not present

## 2022-08-07 DIAGNOSIS — R0789 Other chest pain: Secondary | ICD-10-CM | POA: Diagnosis not present

## 2022-08-07 DIAGNOSIS — G893 Neoplasm related pain (acute) (chronic): Secondary | ICD-10-CM | POA: Diagnosis not present

## 2022-08-13 DIAGNOSIS — S29011A Strain of muscle and tendon of front wall of thorax, initial encounter: Secondary | ICD-10-CM | POA: Diagnosis not present

## 2022-08-13 DIAGNOSIS — M9901 Segmental and somatic dysfunction of cervical region: Secondary | ICD-10-CM | POA: Diagnosis not present

## 2022-08-13 DIAGNOSIS — M5382 Other specified dorsopathies, cervical region: Secondary | ICD-10-CM | POA: Diagnosis not present

## 2022-08-14 DIAGNOSIS — R0789 Other chest pain: Secondary | ICD-10-CM | POA: Diagnosis not present

## 2022-08-14 DIAGNOSIS — L598 Other specified disorders of the skin and subcutaneous tissue related to radiation: Secondary | ICD-10-CM | POA: Diagnosis not present

## 2022-08-14 DIAGNOSIS — Z6821 Body mass index (BMI) 21.0-21.9, adult: Secondary | ICD-10-CM | POA: Diagnosis not present

## 2022-08-14 DIAGNOSIS — J701 Chronic and other pulmonary manifestations due to radiation: Secondary | ICD-10-CM | POA: Diagnosis not present

## 2022-08-14 DIAGNOSIS — Y842 Radiological procedure and radiotherapy as the cause of abnormal reaction of the patient, or of later complication, without mention of misadventure at the time of the procedure: Secondary | ICD-10-CM | POA: Diagnosis not present

## 2022-08-18 ENCOUNTER — Other Ambulatory Visit: Payer: Self-pay

## 2022-08-27 DIAGNOSIS — R0789 Other chest pain: Secondary | ICD-10-CM | POA: Diagnosis not present

## 2022-08-27 DIAGNOSIS — C439 Malignant melanoma of skin, unspecified: Secondary | ICD-10-CM | POA: Diagnosis not present

## 2022-08-27 DIAGNOSIS — Z5181 Encounter for therapeutic drug level monitoring: Secondary | ICD-10-CM | POA: Diagnosis not present

## 2022-08-28 DIAGNOSIS — M62838 Other muscle spasm: Secondary | ICD-10-CM | POA: Diagnosis not present

## 2022-08-28 DIAGNOSIS — M792 Neuralgia and neuritis, unspecified: Secondary | ICD-10-CM | POA: Diagnosis not present

## 2022-08-28 DIAGNOSIS — Z923 Personal history of irradiation: Secondary | ICD-10-CM | POA: Diagnosis not present

## 2022-08-28 DIAGNOSIS — R0789 Other chest pain: Secondary | ICD-10-CM | POA: Diagnosis not present

## 2022-09-09 DIAGNOSIS — M5382 Other specified dorsopathies, cervical region: Secondary | ICD-10-CM | POA: Diagnosis not present

## 2022-09-09 DIAGNOSIS — S29011A Strain of muscle and tendon of front wall of thorax, initial encounter: Secondary | ICD-10-CM | POA: Diagnosis not present

## 2022-09-09 DIAGNOSIS — M9901 Segmental and somatic dysfunction of cervical region: Secondary | ICD-10-CM | POA: Diagnosis not present

## 2022-09-21 DIAGNOSIS — M9901 Segmental and somatic dysfunction of cervical region: Secondary | ICD-10-CM | POA: Diagnosis not present

## 2022-09-21 DIAGNOSIS — M5382 Other specified dorsopathies, cervical region: Secondary | ICD-10-CM | POA: Diagnosis not present

## 2022-09-21 DIAGNOSIS — S29011A Strain of muscle and tendon of front wall of thorax, initial encounter: Secondary | ICD-10-CM | POA: Diagnosis not present

## 2022-09-29 ENCOUNTER — Telehealth: Payer: Self-pay | Admitting: Radiation Oncology

## 2022-09-29 NOTE — Telephone Encounter (Signed)
Called patient to schedule a consultation visit with Dr. Lisbeth Renshaw. Patient stated she has been seen at [facility] and will not need to be seen by Dr. Lisbeth Renshaw. Closing referral until further notice.

## 2022-10-13 DIAGNOSIS — L578 Other skin changes due to chronic exposure to nonionizing radiation: Secondary | ICD-10-CM | POA: Diagnosis not present

## 2022-10-13 DIAGNOSIS — D2271 Melanocytic nevi of right lower limb, including hip: Secondary | ICD-10-CM | POA: Diagnosis not present

## 2022-10-13 DIAGNOSIS — D2272 Melanocytic nevi of left lower limb, including hip: Secondary | ICD-10-CM | POA: Diagnosis not present

## 2022-10-13 DIAGNOSIS — L821 Other seborrheic keratosis: Secondary | ICD-10-CM | POA: Diagnosis not present

## 2022-10-13 DIAGNOSIS — D2261 Melanocytic nevi of right upper limb, including shoulder: Secondary | ICD-10-CM | POA: Diagnosis not present

## 2022-10-13 DIAGNOSIS — D225 Melanocytic nevi of trunk: Secondary | ICD-10-CM | POA: Diagnosis not present

## 2022-10-13 DIAGNOSIS — D485 Neoplasm of uncertain behavior of skin: Secondary | ICD-10-CM | POA: Diagnosis not present

## 2022-10-19 DIAGNOSIS — M9901 Segmental and somatic dysfunction of cervical region: Secondary | ICD-10-CM | POA: Diagnosis not present

## 2022-10-19 DIAGNOSIS — M5382 Other specified dorsopathies, cervical region: Secondary | ICD-10-CM | POA: Diagnosis not present

## 2022-10-19 DIAGNOSIS — S39012A Strain of muscle, fascia and tendon of lower back, initial encounter: Secondary | ICD-10-CM | POA: Diagnosis not present

## 2022-10-21 DIAGNOSIS — C439 Malignant melanoma of skin, unspecified: Secondary | ICD-10-CM | POA: Diagnosis not present

## 2022-10-21 DIAGNOSIS — Z9889 Other specified postprocedural states: Secondary | ICD-10-CM | POA: Diagnosis not present

## 2022-10-21 DIAGNOSIS — C7931 Secondary malignant neoplasm of brain: Secondary | ICD-10-CM | POA: Diagnosis not present

## 2022-10-21 DIAGNOSIS — Z923 Personal history of irradiation: Secondary | ICD-10-CM | POA: Diagnosis not present

## 2022-10-28 ENCOUNTER — Encounter: Payer: Self-pay | Admitting: Internal Medicine

## 2022-10-28 ENCOUNTER — Ambulatory Visit (INDEPENDENT_AMBULATORY_CARE_PROVIDER_SITE_OTHER): Payer: BC Managed Care – PPO | Admitting: Internal Medicine

## 2022-10-28 VITALS — BP 102/64 | HR 64 | Temp 97.4°F | Ht 69.0 in | Wt 147.0 lb

## 2022-10-28 DIAGNOSIS — Z0000 Encounter for general adult medical examination without abnormal findings: Secondary | ICD-10-CM | POA: Diagnosis not present

## 2022-10-28 DIAGNOSIS — C7931 Secondary malignant neoplasm of brain: Secondary | ICD-10-CM

## 2022-10-28 DIAGNOSIS — C7802 Secondary malignant neoplasm of left lung: Secondary | ICD-10-CM | POA: Diagnosis not present

## 2022-10-28 NOTE — Progress Notes (Signed)
Established Patient Office Visit     CC/Reason for Visit: Annual preventive exam  HPI: Leslie [name] is a 46 y.o. female who is coming in today for the above mentioned reasons. 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 Earlie Server.  Otherwise she has been in excellent health. She has had some issues with left chest wall pain and was ultimately diagnosed with radiation fibrosis and has had some improvement on vitamin E and pentoxifylline.  She has routine eye and dental care, all immunizations are up-to-date, cancer screening is up-to-date.   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 Patient follows with Dr. Curt Bears at [facility] as of 05/06/22 was on 01/12/22 in [place].   Migraines    hx of migraines when pt was younger   Pneumothorax 12/2020   Patient had a chest tube/drain for about 4 days per pt.   Yeast infection     Past Surgical History:  Procedure Laterality Date   APPLICATION OF CRANIAL NAVIGATION Left 04/29/2018   Procedure: APPLICATION OF CRANIAL NAVIGATION;  Surgeon: Earnie Larsson, MD;  Location: [facility];  Service: Neurosurgery;  Laterality: Left;   CRANIOTOMY Left 04/29/2018   Procedure: LEFT CRANIOTOMY FOR  TUMOR BRAIN LAB;  Surgeon: Earnie Larsson, MD;  Location: [facility];  Service: Neurosurgery;  Laterality: Left;   HYSTEROSCOPY WITH D & C N/A 05/07/2022   Procedure: DILATATION AND CURETTAGE /HYSTEROSCOPY/ POLYPECTOMY;  Surgeon: Delsa Bern, MD;  Location: [facility];  Service: Gynecology;  Laterality: N/A;   [place] EXTRACTION  1992    Social History:  reports that she has never smoked. She has never used smokeless tobacco. She reports current alcohol use. She reports that she does not use drugs.  Allergies: Allergies  Allergen Reactions   Diflucan [Fluconazole] Other (See Comments)    Wheezing and throat pressue    Family History:  Family History  Problem Relation Age of Onset   Lung cancer Father 78   Stomach cancer Neg Hx    Rectal cancer Neg Hx    Esophageal cancer Neg Hx    Colon polyps Neg Hx    Colon cancer Neg Hx      Current Outpatient Medications:    ADVANCED EVENING PRIMROSE OIL PO, Take by mouth daily., Disp: , Rfl:    AMERICAN GINSENG PO, Take 1,500-3,000 mg by mouth daily. Depending on level of fatigue, Disp: , Rfl:    Amino Acids (L-CARNITINE) LIQD, Take 2,000 mg by mouth daily. Liquid L-Carnitine 2000 mg, Disp: , Rfl:    Coenzyme Q10 (COQ-10) 100 MG CAPS, Take 1 tablet by mouth daily., Disp: , Rfl:    CREATINE MONOHYDRATE PO, Take 6 g by mouth daily. Creatine MonoHydrate 6 g (on training cycle), Disp: , Rfl:    Glucosamine-Chondroit-Vit C-Mn (GLUCOSAMINE 1500 COMPLEX PO), Take 1 tablet by mouth daily. Vegan CMO Glucosamine, Disp: , Rfl:    Lysine POWD, 1,600 mg by Does not apply route daily. L-Lysine Powder 1600 mg,  Disp: , Rfl:    Methylsulfonylmethane (MSM) 1000 MG CAPS, Take 4 capsules by mouth daily. 4000 mg (1/2 before workout and 1/2 afterwards), Disp: , Rfl:    Multiple Vitamins-Minerals (ZINC PO), Take 1 tablet by mouth daily. Raw Zinc 30 mg (cycle every other week), Disp: , Rfl:    NON FORMULARY, Take 1 Dose by mouth as needed. Cordyceps 750 mg, Disp: , Rfl:    NON FORMULARY, Take 1 Dose by mouth daily. Curcumin Phytosome (Bioperrine) 3000 mg, Disp: , Rfl:    pentoxifylline (TRENTAL) 400 MG CR tablet, Take 400 mg by mouth daily., Disp: , Rfl:    pregabalin (LYRICA) 25 MG capsule, Take 25 mg by mouth at  bedtime., Disp: , Rfl:    SM Omega-3-6-9 Fatty Acids CAPS, Take by mouth., Disp: , Rfl:   Review of Systems:  Constitutional: Denies fever, chills, diaphoresis, appetite change and fatigue.  HEENT: Denies photophobia, eye pain, redness, hearing loss, ear pain, congestion, sore throat, rhinorrhea, sneezing, mouth sores, trouble swallowing, neck pain, neck stiffness and tinnitus.   Respiratory: Denies SOB, DOE, cough, chest tightness,  and wheezing.   Cardiovascular: Denies chest pain, palpitations and leg swelling.  Gastrointestinal: Denies nausea, vomiting, abdominal pain, diarrhea, constipation, blood in stool and abdominal distention.  Genitourinary: Denies dysuria, urgency, frequency, hematuria, flank pain and difficulty urinating.  Endocrine: Denies: hot or cold intolerance, sweats, changes in hair or nails, polyuria, polydipsia. Musculoskeletal: Denies myalgias, back pain, joint swelling, arthralgias and gait problem.  Skin: Denies pallor, rash and wound.  Neurological: Denies dizziness, seizures, syncope, weakness, light-headedness, numbness and headaches.  Hematological: Denies adenopathy. Easy bruising, personal or family bleeding history  Psychiatric/Behavioral: Denies suicidal ideation, mood changes, confusion, nervousness, sleep disturbance and agitation    Physical Exam: Vitals:   10/28/22 1313  BP: 102/64  Pulse: 64  Temp: (!) 97.4 F (36.3 C)  TempSrc: Oral  SpO2: 99%  Weight: 147 lb (66.7 kg)  Height: 5\' 9"  (1.753 m)    Body mass index is 21.71 kg/m.   Constitutional: NAD, calm, comfortable Eyes: PERRL, lids and conjunctivae normal ENMT: Mucous membranes are moist. Posterior pharynx clear of any exudate or lesions. Normal dentition. Tympanic membrane is pearly white, no erythema or bulging. Neck: normal, supple, no masses, no thyromegaly Respiratory: clear to auscultation bilaterally, no wheezing, no crackles. Normal respiratory effort. No accessory muscle use.   Cardiovascular: Regular rate and rhythm, no murmurs / rubs / gallops. No extremity edema. 2+ pedal pulses. No carotid bruits.  Abdomen: no tenderness, no masses palpated. No hepatosplenomegaly. Bowel sounds positive.  Musculoskeletal: no clubbing / cyanosis. No joint deformity upper and lower extremities. Good ROM, no contractures. Normal muscle tone.  Skin: no rashes, lesions, ulcers. No induration Neurologic: CN 2-12 grossly intact. Sensation intact, DTR normal. Strength 5/5 in all 4.  Psychiatric: Normal judgment and insight. Alert and oriented x 3. Normal mood.   [facility] Office Visit from 10/28/2022 in [facility] [place] [facility]  PHQ-9 Total Score 0         Impression and Plan:  Encounter for preventive health examination  Malignant melanoma metastatic to brain [facility]) - Plan: CBC with Differential/Platelet, Comprehensive metabolic panel, Hemoglobin A1c, Lipid panel, TSH, Vitamin B12, VITAMIN D 25 Hydroxy (Vit-D Deficiency, Fractures), VITAMIN D 25 Hydroxy (Vit-D Deficiency, Fractures), Vitamin B12, TSH, Lipid panel, Hemoglobin A1c, Comprehensive metabolic panel, CBC with Differential/Platelet  Melanoma metastatic to left lung ([place])    -Recommend routine eye and dental care. -Immunizations: All immunizations are  up-to-date and age-appropriate -Healthy lifestyle discussed in detail. -Labs to be updated today. -Colon cancer screening: 06/2022, negative Cologuard -Breast cancer screening: This year, obtain records from GYN -Cervical cancer screening: This year, obtain records from GYN -Lung cancer screening: Not applicable -Prostate cancer screening: Not applicable -DEXA: Not applicable    Moana Munford Isaac Bliss, MD [facility]

## 2022-10-29 ENCOUNTER — Other Ambulatory Visit: Payer: Self-pay

## 2022-10-29 ENCOUNTER — Encounter: Payer: Self-pay | Admitting: Internal Medicine

## 2022-10-29 LAB — LIPID PANEL
Cholesterol: 151 mg/dL (ref 0–200)
HDL: 60.9 mg/dL (ref >39.00)
LDL Cholesterol: 77 mg/dL (ref 0–99)
NonHDL: 90.08
Total CHOL/HDL Ratio: 2
Triglycerides: 65 mg/dL (ref 0.0–149.0)
VLDL: 13 mg/dL (ref 0.0–40.0)

## 2022-10-29 LAB — VITAMIN B12: Vitamin B-12: 1121 pg/mL — ABNORMAL HIGH (ref 211–911)

## 2022-10-29 LAB — COMPREHENSIVE METABOLIC PANEL
ALT: 25 U/L (ref 0–35)
AST: 35 U/L (ref 0–37)
Albumin: 4.5 g/dL (ref 3.5–5.2)
Alkaline Phosphatase: 70 U/L (ref 39–117)
BUN: 15 mg/dL (ref 6–23)
CO2: 29 mEq/L (ref 19–32)
Calcium: 9.6 mg/dL (ref 8.4–10.5)
Chloride: 102 mEq/L (ref 96–112)
Creatinine, Ser: 0.95 mg/dL (ref 0.40–1.20)
GFR: 71.6 mL/min (ref >60.00)
Glucose, Bld: 62 mg/dL — ABNORMAL LOW (ref 70–99)
Potassium: 4.3 mEq/L (ref 3.5–5.1)
Sodium: 138 mEq/L (ref 135–145)
Total Bilirubin: 0.4 mg/dL (ref 0.2–1.2)
Total Protein: 7 g/dL (ref 6.0–8.3)

## 2022-10-29 LAB — CBC WITH DIFFERENTIAL/PLATELET
Basophils Absolute: 0.1 10*3/uL (ref 0.0–0.1)
Basophils Relative: 1.1 % (ref 0.0–3.0)
Eosinophils Absolute: 0.1 10*3/uL (ref 0.0–0.7)
Eosinophils Relative: 1.3 % (ref 0.0–5.0)
HCT: 41.4 % (ref 36.0–46.0)
Hemoglobin: 13.9 g/dL (ref 12.0–15.0)
Lymphocytes Relative: 29.4 % (ref 12.0–46.0)
Lymphs Abs: 1.5 10*3/uL (ref 0.7–4.0)
MCHC: 33.7 g/dL (ref 30.0–36.0)
MCV: 93.5 fl (ref 78.0–100.0)
Monocytes Absolute: 0.5 10*3/uL (ref 0.1–1.0)
Monocytes Relative: 9.7 % (ref 3.0–12.0)
Neutro Abs: 3 10*3/uL (ref 1.4–7.7)
Neutrophils Relative %: 58.5 % (ref 43.0–77.0)
Platelets: 222 10*3/uL (ref 150.0–400.0)
RBC: 4.43 Mil/uL (ref 3.87–5.11)
RDW: 13.1 % (ref 11.5–15.5)
WBC: 5.1 10*3/uL (ref 4.0–10.5)

## 2022-10-29 LAB — VITAMIN D 25 HYDROXY (VIT D DEFICIENCY, FRACTURES): VITD: 43.5 ng/mL (ref 30.00–100.00)

## 2022-10-29 LAB — TSH: TSH: 2.73 u[IU]/mL (ref 0.35–5.50)

## 2022-10-29 LAB — HEMOGLOBIN A1C: Hgb A1c MFr Bld: 5.3 % (ref 4.6–6.5)

## 2022-10-30 ENCOUNTER — Encounter: Payer: Self-pay | Admitting: Internal Medicine

## 2022-10-30 DIAGNOSIS — R0789 Other chest pain: Secondary | ICD-10-CM | POA: Diagnosis not present

## 2022-10-30 DIAGNOSIS — M62838 Other muscle spasm: Secondary | ICD-10-CM | POA: Diagnosis not present

## 2022-10-30 DIAGNOSIS — M792 Neuralgia and neuritis, unspecified: Secondary | ICD-10-CM | POA: Diagnosis not present

## 2022-10-30 DIAGNOSIS — S2232XG Fracture of one rib, left side, subsequent encounter for fracture with delayed healing: Secondary | ICD-10-CM | POA: Diagnosis not present

## 2022-11-03 ENCOUNTER — Other Ambulatory Visit: Payer: Self-pay | Admitting: Internal Medicine

## 2022-11-03 DIAGNOSIS — C3432 Malignant neoplasm of lower lobe, left bronchus or lung: Secondary | ICD-10-CM

## 2022-11-03 DIAGNOSIS — C7801 Secondary malignant neoplasm of right lung: Secondary | ICD-10-CM

## 2022-11-03 DIAGNOSIS — C7802 Secondary malignant neoplasm of left lung: Secondary | ICD-10-CM

## 2022-11-04 ENCOUNTER — Encounter: Payer: Self-pay | Admitting: Medical Oncology

## 2022-11-10 ENCOUNTER — Encounter: Payer: Self-pay | Admitting: Medical Oncology

## 2022-11-10 DIAGNOSIS — M5382 Other specified dorsopathies, cervical region: Secondary | ICD-10-CM | POA: Diagnosis not present

## 2022-11-10 DIAGNOSIS — M9901 Segmental and somatic dysfunction of cervical region: Secondary | ICD-10-CM | POA: Diagnosis not present

## 2022-11-10 DIAGNOSIS — S39012A Strain of muscle, fascia and tendon of lower back, initial encounter: Secondary | ICD-10-CM | POA: Diagnosis not present

## 2022-11-23 ENCOUNTER — Encounter: Payer: Self-pay | Admitting: Internal Medicine

## 2022-11-24 ENCOUNTER — Ambulatory Visit (INDEPENDENT_AMBULATORY_CARE_PROVIDER_SITE_OTHER): Payer: BC Managed Care – PPO | Admitting: Internal Medicine

## 2022-11-24 ENCOUNTER — Encounter: Payer: Self-pay | Admitting: Internal Medicine

## 2022-11-24 VITALS — BP 110/76 | HR 64 | Temp 97.8°F | Wt 148.4 lb

## 2022-11-24 DIAGNOSIS — R222 Localized swelling, mass and lump, trunk: Secondary | ICD-10-CM

## 2022-11-24 NOTE — Progress Notes (Signed)
Established Patient Office Visit     CC/Reason for Visit: Abdominal wall lump above navel  HPI: Leslie [name] is a 46 y.o. female who is coming in today for the above mentioned reasons. Past Medical History is significant for: Metastatic melanoma to the brain.  Last week she noted a small lump above her navel.  She has been told in the past that she had an abdominal wall hernia.  She had some abdominal cramping that has since resolved.  The last couple days she has not noticed this protuberance.  She stopped taking her pentoxifylline thinking that it might be related to this.   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 Patient follows with Dr. Curt Bears at [facility] as of 05/06/22 was on 01/12/22 in [place].   Migraines    hx of migraines when pt was younger   Pneumothorax 12/2020   Patient had a chest tube/drain for about 4 days per pt.   Yeast infection     Past Surgical History:  Procedure Laterality Date   APPLICATION OF CRANIAL NAVIGATION Left 04/29/2018   Procedure: APPLICATION OF CRANIAL NAVIGATION;  Surgeon: Earnie Larsson, MD;  Location: [facility];  Service: Neurosurgery;  Laterality: Left;   CRANIOTOMY Left 04/29/2018   Procedure: LEFT CRANIOTOMY FOR  TUMOR BRAIN LAB;  Surgeon: Earnie Larsson, MD;  Location: [facility];  Service: Neurosurgery;  Laterality: Left;   HYSTEROSCOPY WITH D & C N/A 05/07/2022   Procedure: DILATATION AND CURETTAGE /HYSTEROSCOPY/ POLYPECTOMY;  Surgeon: Delsa Bern, MD;  Location: [facility];  Service:  Gynecology;  Laterality: N/A;   [place] EXTRACTION  1992    Social History:  reports that she has never smoked. She has never used smokeless tobacco. She reports current alcohol use. She reports that she does not use drugs.  Allergies: Allergies  Allergen Reactions   Diflucan [Fluconazole] Other (See Comments)    Wheezing and throat pressue    Family History:  Family History  Problem Relation Age of Onset   Lung cancer Father 45   Stomach cancer Neg Hx    Rectal cancer Neg Hx    Esophageal cancer Neg Hx    Colon polyps Neg Hx    Colon cancer Neg Hx      Current Outpatient Medications:    ADVANCED EVENING PRIMROSE OIL PO, Take by mouth daily., Disp: , Rfl:    AMERICAN GINSENG PO, Take 1,500-3,000 mg by mouth daily. Depending on level of fatigue, Disp: , Rfl:    Amino Acids (L-CARNITINE) LIQD, Take 2,000 mg by mouth daily. Liquid L-Carnitine 2000 mg, Disp: , Rfl:    Coenzyme Q10 (COQ-10) 100 MG CAPS, Take 1 tablet by mouth daily., Disp: , Rfl:    CREATINE MONOHYDRATE PO, Take 6 g by mouth daily. Creatine MonoHydrate 6 g (on training cycle), Disp: , Rfl:    Glucosamine-Chondroit-Vit C-Mn (GLUCOSAMINE 1500 COMPLEX PO), Take 1 tablet by mouth daily. Vegan CMO Glucosamine, Disp: , Rfl:    Lysine POWD, 1,600 mg by Does not apply  route daily. L-Lysine Powder 1600 mg, Disp: , Rfl:    Methylsulfonylmethane (MSM) 1000 MG CAPS, Take 4 capsules by mouth daily. 4000 mg (1/2 before workout and 1/2 afterwards), Disp: , Rfl:    Multiple Vitamins-Minerals (ZINC PO), Take 1 tablet by mouth daily. Raw Zinc 30 mg (cycle every other week), Disp: , Rfl:    NON FORMULARY, Take 1 Dose by mouth as needed. Cordyceps 750 mg, Disp: , Rfl:    NON FORMULARY, Take 1 Dose by mouth daily. Curcumin Phytosome (Bioperrine) 3000 mg, Disp: , Rfl:    pentoxifylline (TRENTAL) 400 MG CR tablet, Take 400 mg by mouth daily., Disp: , Rfl:    pregabalin (LYRICA) 25 MG capsule, Take 25 mg by mouth at bedtime., Disp: ,  Rfl:    SM Omega-3-6-9 Fatty Acids CAPS, Take by mouth., Disp: , Rfl:   Review of Systems:  Constitutional: Denies fever, chills, diaphoresis, appetite change and fatigue.  HEENT: Denies photophobia, eye pain, redness, hearing loss, ear pain, congestion, sore throat, rhinorrhea, sneezing, mouth sores, trouble swallowing, neck pain, neck stiffness and tinnitus.   Respiratory: Denies SOB, DOE, cough, chest tightness,  and wheezing.   Cardiovascular: Denies chest pain, palpitations and leg swelling.  Gastrointestinal: Denies nausea, vomiting, abdominal pain, diarrhea, constipation, blood in stool and abdominal distention.  Genitourinary: Denies dysuria, urgency, frequency, hematuria, flank pain and difficulty urinating.  Endocrine: Denies: hot or cold intolerance, sweats, changes in hair or nails, polyuria, polydipsia. Musculoskeletal: Denies myalgias, back pain, joint swelling, arthralgias and gait problem.  Skin: Denies pallor, rash and wound.  Neurological: Denies dizziness, seizures, syncope, weakness, light-headedness, numbness and headaches.  Hematological: Denies adenopathy. Easy bruising, personal or family bleeding history  Psychiatric/Behavioral: Denies suicidal ideation, mood changes, confusion, nervousness, sleep disturbance and agitation    Physical Exam: Vitals:   11/24/22 1501  BP: 110/76  Pulse: 64  Temp: 97.8 F (36.6 C)  TempSrc: Oral  SpO2: 99%  Weight: 148 lb 6.4 oz (67.3 kg)    Body mass index is 21.91 kg/m.   Constitutional: NAD, calm, comfortable Eyes: PERRL, lids and conjunctivae normal ENMT: Mucous membranes are moist.  Abdomen: no tenderness, no masses palpated even with Valsalva maneuvers such as coughing and straining. No hepatosplenomegaly.     Impression and Plan:  Abdominal wall mass  -Not apparent on exam today.  She has been told she has a hernia in the past but again not apparent today. -She is scheduled in January to have her full body CT  scan for follow-up of her metastatic melanoma.  If any ventral wall hernias are present this CT should show. -She will continue to observe and update me if any changes. -Unsure what relationship, if anything, pentoxifylline has with this.  Time spent:20 minutes reviewing chart, interviewing and examining patient and formulating plan of care.     Lelon Frohlich, MD [facility]

## 2022-11-26 ENCOUNTER — Encounter: Payer: Self-pay | Admitting: Medical Oncology

## 2022-12-03 ENCOUNTER — Other Ambulatory Visit: Payer: Self-pay | Admitting: *Deleted

## 2022-12-15 ENCOUNTER — Other Ambulatory Visit: Payer: Self-pay | Admitting: Pharmacist

## 2022-12-17 DIAGNOSIS — S39012A Strain of muscle, fascia and tendon of lower back, initial encounter: Secondary | ICD-10-CM | POA: Diagnosis not present

## 2022-12-17 DIAGNOSIS — M5382 Other specified dorsopathies, cervical region: Secondary | ICD-10-CM | POA: Diagnosis not present

## 2022-12-17 DIAGNOSIS — M9901 Segmental and somatic dysfunction of cervical region: Secondary | ICD-10-CM | POA: Diagnosis not present

## 2022-12-18 ENCOUNTER — Ambulatory Visit (HOSPITAL_COMMUNITY)
Admission: RE | Admit: 2022-12-18 | Discharge: 2022-12-18 | Disposition: A | Discharge status: Home / Self Care | Payer: BC Managed Care – PPO | Source: Ambulatory Visit | Attending: Internal Medicine | Admitting: Internal Medicine

## 2022-12-18 DIAGNOSIS — C7802 Secondary malignant neoplasm of left lung: Secondary | ICD-10-CM | POA: Diagnosis not present

## 2022-12-18 MED ORDER — IOHEXOL 300 MG/ML  SOLN
100.0000 mL | Freq: Once | INTRAMUSCULAR | Status: AC | PRN
  Administered 2022-12-18: 100 mL via INTRAVENOUS

## 2022-12-24 ENCOUNTER — Encounter: Payer: Self-pay | Admitting: Internal Medicine

## 2022-12-24 DIAGNOSIS — T148XXA Other injury of unspecified body region, initial encounter: Secondary | ICD-10-CM

## 2022-12-24 DIAGNOSIS — C439 Malignant melanoma of skin, unspecified: Secondary | ICD-10-CM

## 2022-12-24 DIAGNOSIS — M5382 Other specified dorsopathies, cervical region: Secondary | ICD-10-CM | POA: Diagnosis not present

## 2022-12-24 DIAGNOSIS — S39012A Strain of muscle, fascia and tendon of lower back, initial encounter: Secondary | ICD-10-CM | POA: Diagnosis not present

## 2022-12-24 DIAGNOSIS — M9901 Segmental and somatic dysfunction of cervical region: Secondary | ICD-10-CM | POA: Diagnosis not present

## 2022-12-25 ENCOUNTER — Encounter: Payer: Self-pay | Admitting: Medical Oncology

## 2022-12-25 ENCOUNTER — Other Ambulatory Visit: Payer: BC Managed Care – PPO

## 2022-12-25 ENCOUNTER — Ambulatory Visit (HOSPITAL_COMMUNITY)
Admission: RE | Admit: 2022-12-25 | Discharge: 2022-12-25 | Disposition: A | Discharge status: Home / Self Care | Payer: BC Managed Care – PPO | Source: Ambulatory Visit | Attending: Internal Medicine | Admitting: Internal Medicine

## 2022-12-25 DIAGNOSIS — C7802 Secondary malignant neoplasm of left lung: Secondary | ICD-10-CM | POA: Insufficient documentation

## 2022-12-25 DIAGNOSIS — Z85118 Personal history of other malignant neoplasm of bronchus and lung: Secondary | ICD-10-CM | POA: Diagnosis not present

## 2022-12-25 LAB — GLUCOSE, CAPILLARY: Glucose-Capillary: 92 mg/dL (ref 70–99)

## 2022-12-25 MED ORDER — FLUDEOXYGLUCOSE F - 18 (FDG) INJECTION
7.4000 | Freq: Once | INTRAVENOUS | Status: AC
  Administered 2022-12-25: 7.35 via INTRAVENOUS

## 2022-12-28 ENCOUNTER — Other Ambulatory Visit: Payer: BC Managed Care – PPO

## 2022-12-29 ENCOUNTER — Encounter: Payer: Self-pay | Admitting: Medical Oncology

## 2022-12-31 ENCOUNTER — Other Ambulatory Visit: Payer: Self-pay

## 2022-12-31 ENCOUNTER — Inpatient Hospital Stay: Payer: BC Managed Care – PPO | Attending: Internal Medicine | Admitting: Internal Medicine

## 2022-12-31 ENCOUNTER — Inpatient Hospital Stay: Payer: BC Managed Care – PPO

## 2022-12-31 ENCOUNTER — Ambulatory Visit: Payer: BC Managed Care – PPO | Admitting: Internal Medicine

## 2022-12-31 VITALS — BP 127/81 | HR 73 | Temp 98.9°F | Resp 15 | Wt 147.4 lb

## 2022-12-31 DIAGNOSIS — C7931 Secondary malignant neoplasm of brain: Secondary | ICD-10-CM | POA: Diagnosis not present

## 2022-12-31 DIAGNOSIS — C7800 Secondary malignant neoplasm of unspecified lung: Secondary | ICD-10-CM | POA: Diagnosis not present

## 2022-12-31 DIAGNOSIS — Z9221 Personal history of antineoplastic chemotherapy: Secondary | ICD-10-CM | POA: Diagnosis not present

## 2022-12-31 DIAGNOSIS — C7802 Secondary malignant neoplasm of left lung: Secondary | ICD-10-CM

## 2022-12-31 DIAGNOSIS — Z79899 Other long term (current) drug therapy: Secondary | ICD-10-CM | POA: Diagnosis not present

## 2022-12-31 DIAGNOSIS — Z923 Personal history of irradiation: Secondary | ICD-10-CM | POA: Diagnosis not present

## 2022-12-31 DIAGNOSIS — C439 Malignant melanoma of skin, unspecified: Secondary | ICD-10-CM | POA: Insufficient documentation

## 2022-12-31 LAB — CMP (CANCER CENTER ONLY)
ALT: 22 U/L (ref 0–44)
AST: 30 U/L (ref 15–41)
Albumin: 4.4 g/dL (ref 3.5–5.0)
Alkaline Phosphatase: 64 U/L (ref 38–126)
Anion gap: 5 (ref 5–15)
BUN: 18 mg/dL (ref 6–20)
CO2: 27 mmol/L (ref 22–32)
Calcium: 9.8 mg/dL (ref 8.9–10.3)
Chloride: 104 mmol/L (ref 98–111)
Creatinine: 1.02 mg/dL — ABNORMAL HIGH (ref 0.44–1.00)
GFR, Estimated: >60 mL/min (ref >60)
Glucose, Bld: 84 mg/dL (ref 70–99)
Potassium: 4.7 mmol/L (ref 3.5–5.1)
Sodium: 136 mmol/L (ref 135–145)
Total Bilirubin: 0.5 mg/dL (ref 0.3–1.2)
Total Protein: 6.8 g/dL (ref 6.5–8.1)

## 2022-12-31 LAB — CBC WITH DIFFERENTIAL (CANCER CENTER ONLY)
Abs Immature Granulocytes: 0.01 10*3/uL (ref 0.00–0.07)
Basophils Absolute: 0 10*3/uL (ref 0.0–0.1)
Basophils Relative: 1 %
Eosinophils Absolute: 0 10*3/uL (ref 0.0–0.5)
Eosinophils Relative: 0 %
HCT: 39.8 % (ref 36.0–46.0)
Hemoglobin: 13.9 g/dL (ref 12.0–15.0)
Immature Granulocytes: 0 %
Lymphocytes Relative: 23 %
Lymphs Abs: 1.2 10*3/uL (ref 0.7–4.0)
MCH: 31.8 pg (ref 26.0–34.0)
MCHC: 34.9 g/dL (ref 30.0–36.0)
MCV: 91.1 fL (ref 80.0–100.0)
Monocytes Absolute: 0.5 10*3/uL (ref 0.1–1.0)
Monocytes Relative: 11 %
Neutro Abs: 3.3 10*3/uL (ref 1.7–7.7)
Neutrophils Relative %: 65 %
Platelet Count: 227 10*3/uL (ref 150–400)
RBC: 4.37 MIL/uL (ref 3.87–5.11)
RDW: 12.5 % (ref 11.5–15.5)
WBC Count: 5.1 10*3/uL (ref 4.0–10.5)
nRBC: 0 % (ref 0.0–0.2)

## 2022-12-31 NOTE — Progress Notes (Signed)
[facility] [phone]   [phone]  OFFICE PROGRESS NOTE  [name], [name], MD [address] [place] [address]  DIAGNOSIS: Metastatic high-grade neoplasm consistent with metastatic malignant melanoma based on the most recent pathology report from [facility] in October 2019, presented with large left left upper/left lower lobe mass with a hypermetabolic AP window lymph node as well as solitary metastatic brain lesion diagnosed in May 2019.  Biomarker Findings Microsatellite status - MS-Stable Tumor Mutational Burden - TMB-Intermediate (16 Muts/Mb) Genomic Findings For a complete list of the genes assayed, please refer to the Appendix. CD274 (PD-L1) amplification NRAS Q61R PDCD1LG2 (PD-L2) amplification MYC amplification EPHB1 amplification JAK2 amplification RB1 Q93*, B908* TERT promoter -146C>T TP53 C275W   PRIOR THERAPY: 1) left temporal craniotomy with resection of tumor with intraoperative stereotactic guidance for volumetric resection under the care of Dr. Jordan Likes on 04/29/2018. 2) First-line treatment with immunotherapy with Keytruda 200 mg IV every 3 weeks.  First dose June 02, 2018.  Status post 3 cycles.  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 3) palliative radiotherapy to the left lung mass under the care of Dr. Mitzi Hansen completed on 08/24/2018. 4) Resuming her treatment again with Keytruda 200 mg IV every 3 weeks, first dose 09/08/2018.  Status post 2 cycles. 5) Treatment with immunotherapy with ipilimumab 3 mg/KG and nivolumab 1 mg/KG every 3 weeks for the first 4 cycles followed by maintenance nivolumab 240 mg IV every 2 weeks.  First dose of this treatment October 26, 2018.  Status post 40 cycles.  Ipilimumab was discontinued after cycle #2 for the significant liver dysfunction.  Starting from cycle #3 the patient is on treatment with single agent  nivolumab. Starting from cycle #39 the patient is switching to nivolumab 480 mg IV every 4 weeks.  Her treatment is currently on hold since December 02, 2020.  CURRENT THERAPY: Observation.  INTERVAL HISTORY: Leslie [name] 47 y.o. female returns to the clinic today for 6 months follow-up visit.  The patient is feeling fine today with no concerning complaints except for the pain on the left side of the chest.  She had several rib fractures in that area partially could be secondary to previous radiotherapy with osteopenia and scarring in that area.  She is followed by several physician including pain management by Dr. Laural Benes.  She denied having any cough or shortness of breath or hemoptysis.  She has no current nausea, vomiting, diarrhea or constipation.  She has no headache or visual changes.  She had repeat CT scan of the chest, abdomen pelvis as well as a PET scan performed recently and she is here for evaluation and discussion of her imaging studies and recommendation regarding her condition.  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 Patient follows with Dr. Si Gaul at [facility], LOV as of 05/06/22 was on 01/12/22 in Epic.   Migraines    hx of migraines when pt was younger   Pneumothorax 12/2020   Patient had a chest tube/drain  for about 4 days per pt.   Yeast infection     ALLERGIES:  is allergic to diflucan [fluconazole].  MEDICATIONS:  Current Outpatient Medications  Medication Sig Dispense Refill   ADVANCED EVENING PRIMROSE OIL PO Take by mouth daily.     AMERICAN  GINSENG PO Take 1,500-3,000 mg by mouth daily. Depending on level of fatigue     Amino Acids (L-CARNITINE) LIQD Take 2,000 mg by mouth daily. Liquid L-Carnitine 2000 mg     Coenzyme Q10 (COQ-10) 100 MG CAPS Take 1 tablet by mouth daily.     CREATINE MONOHYDRATE PO Take 6 g by mouth daily. Creatine MonoHydrate 6 g (on training cycle)     Glucosamine-Chondroit-Vit C-Mn (GLUCOSAMINE 1500 COMPLEX PO) Take 1 tablet by mouth daily. Vegan CMO Glucosamine     Lysine POWD 1,600 mg by Does not apply route daily. L-Lysine Powder 1600 mg     Methylsulfonylmethane (MSM) 1000 MG CAPS Take 4 capsules by mouth daily. 4000 mg (1/2 before workout and 1/2 afterwards)     Multiple Vitamins-Minerals (ZINC PO) Take 1 tablet by mouth daily. Raw Zinc 30 mg (cycle every other week)     NON FORMULARY Take 1 Dose by mouth as needed. Cordyceps 750 mg     NON FORMULARY Take 1 Dose by mouth daily. Curcumin Phytosome (Bioperrine) 3000 mg     SM Omega-3-6-9 Fatty Acids CAPS Take by mouth.     No current facility-administered medications for this visit.    SURGICAL HISTORY:  Past Surgical History:  Procedure Laterality Date   APPLICATION OF CRANIAL NAVIGATION Left 04/29/2018   Procedure: APPLICATION OF CRANIAL NAVIGATION;  Surgeon: Julio Sicks, MD;  Location: [facility];  Service: Neurosurgery;  Laterality: Left;   CRANIOTOMY Left 04/29/2018   Procedure: LEFT CRANIOTOMY FOR  TUMOR BRAIN LAB;  Surgeon: Julio Sicks, MD;  Location: [facility] OR;  Service: Neurosurgery;  Laterality: Left;   HYSTEROSCOPY WITH D & C N/A 05/07/2022   Procedure: DILATATION AND CURETTAGE /HYSTEROSCOPY/ POLYPECTOMY;  Surgeon: Silverio Lay, MD;  Location: [facility];  Service: Gynecology;  Laterality: N/A;   WISDOM TOOTH EXTRACTION  1992    REVIEW OF SYSTEMS:  Constitutional: negative Eyes: negative Ears, nose, mouth, throat, and face: negative Respiratory: positive for pleurisy/chest pain Cardiovascular: negative Gastrointestinal:  negative Genitourinary:negative Integument/breast: negative Hematologic/lymphatic: negative Musculoskeletal:negative Neurological: negative Behavioral/Psych: negative Endocrine: negative Allergic/Immunologic: negative   PHYSICAL EXAMINATION: General appearance: alert, cooperative, and no distress Head: Normocephalic, without obvious abnormality, atraumatic Neck: no adenopathy, no JVD, supple, symmetrical, trachea midline, and thyroid not enlarged, symmetric, no tenderness/mass/nodules Lymph nodes: Cervical, supraclavicular, and axillary nodes normal. Resp: clear to auscultation bilaterally Back: negative, symmetric, no curvature. ROM normal. No CVA tenderness. Cardio: regular rate and rhythm, S1, S2 normal, no murmur, click, rub or gallop GI: soft, non-tender; bowel sounds normal; no masses,  no organomegaly Extremities: extremities normal, atraumatic, no cyanosis or edema Neurologic: Alert and oriented X 3, normal strength and tone. Normal symmetric reflexes. Normal coordination and gait  ECOG PERFORMANCE STATUS: 1 - Symptomatic but completely ambulatory  Blood pressure 119/76, pulse 70, temperature (!) 97.4 F (36.3 C), temperature source Tympanic, resp. rate 18, weight 147 lb 8 oz (66.9 kg), last menstrual period 05/28/2022, SpO2 100 %.  LABORATORY DATA: Lab Results  Component Value Date   WBC 4.4 06/11/2022   HGB 13.2 06/11/2022   HCT 38.7 06/11/2022   MCV 93.0 06/11/2022   PLT 218 06/11/2022      Chemistry  Component Value Date/Time   NA 139 06/11/2022 0808   K 4.5 06/11/2022 0808   CL 106 06/11/2022 0808   CO2 30 06/11/2022 0808   BUN 15 06/11/2022 0808   CREATININE 1.12 (H) 06/11/2022 0808      Component Value Date/Time   CALCIUM 9.6 06/11/2022 0808   ALKPHOS 62 06/11/2022 0808   AST 27 06/11/2022 0808   ALT 22 06/11/2022 0808   BILITOT 0.4 06/11/2022 3867       RADIOGRAPHIC STUDIES:    ASSESSMENT AND PLAN: This is a very pleasant 47 years old  white female with highly suspicious metastatic malignant melanoma presented with large mass in the left upper/left lower lobe and mediastinal lymphadenopathy as well as solitary brain metastasis status post left temporal craniotomy and resection of tumor on 04/29/2018 and she is recovering well from her surgery. The patient completed stereotactic radiotherapy to the resection cavity next week under the care of Dr. Mitzi Hansen. The patient underwent treatment with immunotherapy with Keytruda 200 mg IV every 3 weeks status post 3 cycles.   Her scan after cycle #3 showed enlargement of the left upper lobe lung mass.  This was also suspicious for pseudo-progression on immunotherapy.  The patient was started on a palliative course of radiotherapy to the left upper lobe lung mass and she tolerated this treatment fairly well.  She was seen by Dr. Leighton Roach at [facility] and she recommended for the patient to resume her treatment with [facility] for now until she undergoes further molecular studies.  The patient was treated with 2 more cycles of Keytruda and tolerated the treatment well. Repeat CT scan of the chest, abdomen and pelvis at that time showed some improvement in the left upper lobe lung mass but there is still concern about pericardial invasion. She is still not a good candidate for surgical resection because of the pericardial invasion. The final pathology report and recommendation from [facility] was consistent with metastatic melanoma and the recommendation is to switch the patient to a combination immunotherapy with Ipilumumab and nivolumab. The patient underwent treatment with immunotherapy with ipilimumab and nivolumab status post 2 cycles.  Her treatment was on hold for more than 6 weeks secondary to grade 3 hepatic dysfunction.  She had improvement of her liver enzyme after a prolonged treatment with a steroid with a tapering schedule. She resumed her treatment again with single  agent nivolumab.  She is status post 40 cycles of treatment. She has tolerated her treatment well with no concerning adverse effects. Starting from cycle #39 the patient was treated with nivolumab 480 mg IV every 4 weeks. The patient is currently on observation since her last dose on December 02, 2020. The patient has been doing well with no concerning complaints except for the left-sided chest pain from few rib fractures that could be secondary to previous radiotherapy and osteonecrosis in that area. The patient had repeat CT scan of the chest, abdomen pelvis as well as a PET scan performed recently.  I personally and independently reviewed the scan images and discussed the results with the patient today. Her scan showed no concerning finding for disease progression. She had several question about the other incidental finding on the PET scan as well as the CT scan and I answered them completely to her satisfaction. I recommended for the patient to continue on observation with repeat PET scan in 6 months. She was advised to call immediately if she has any other concerning symptoms in the interval.  The patient voices understanding of current disease status and treatment options and is in agreement with the current care plan. All questions were answered. The patient knows to call the clinic with any problems, questions or concerns. We can certainly see the patient much sooner if necessary.  Disclaimer: This note was dictated with voice recognition software. Similar sounding words can inadvertently be transcribed and may not be corrected upon review.

## 2023-01-06 ENCOUNTER — Telehealth: Payer: Self-pay | Admitting: Internal Medicine

## 2023-01-06 NOTE — Telephone Encounter (Signed)
Patient calling regarding her scheduling her dexa scan, says the numbers she was given don't work

## 2023-01-06 NOTE — Telephone Encounter (Signed)
Answered through [place].

## 2023-01-07 ENCOUNTER — Encounter: Payer: Self-pay | Admitting: Internal Medicine

## 2023-01-07 ENCOUNTER — Other Ambulatory Visit: Payer: Self-pay | Admitting: Internal Medicine

## 2023-01-07 DIAGNOSIS — Z923 Personal history of irradiation: Secondary | ICD-10-CM | POA: Diagnosis not present

## 2023-01-07 DIAGNOSIS — R0789 Other chest pain: Secondary | ICD-10-CM | POA: Diagnosis not present

## 2023-01-07 DIAGNOSIS — S2232XG Fracture of one rib, left side, subsequent encounter for fracture with delayed healing: Secondary | ICD-10-CM | POA: Diagnosis not present

## 2023-01-07 DIAGNOSIS — M792 Neuralgia and neuritis, unspecified: Secondary | ICD-10-CM | POA: Diagnosis not present

## 2023-01-20 DIAGNOSIS — J984 Other disorders of lung: Secondary | ICD-10-CM | POA: Diagnosis not present

## 2023-01-20 DIAGNOSIS — S2242XG Multiple fractures of ribs, left side, subsequent encounter for fracture with delayed healing: Secondary | ICD-10-CM | POA: Diagnosis not present

## 2023-01-20 DIAGNOSIS — M85852 Other specified disorders of bone density and structure, left thigh: Secondary | ICD-10-CM | POA: Diagnosis not present

## 2023-01-20 DIAGNOSIS — Z8781 Personal history of (healed) traumatic fracture: Secondary | ICD-10-CM | POA: Diagnosis not present

## 2023-01-20 DIAGNOSIS — S2242XD Multiple fractures of ribs, left side, subsequent encounter for fracture with routine healing: Secondary | ICD-10-CM | POA: Diagnosis not present

## 2023-01-20 DIAGNOSIS — J9811 Atelectasis: Secondary | ICD-10-CM | POA: Diagnosis not present

## 2023-01-20 DIAGNOSIS — X58XXXD Exposure to other specified factors, subsequent encounter: Secondary | ICD-10-CM | POA: Diagnosis not present

## 2023-01-22 DIAGNOSIS — Z8781 Personal history of (healed) traumatic fracture: Secondary | ICD-10-CM | POA: Diagnosis not present

## 2023-01-22 DIAGNOSIS — Z883 Allergy status to other anti-infective agents status: Secondary | ICD-10-CM | POA: Diagnosis not present

## 2023-01-22 DIAGNOSIS — Z888 Allergy status to other drugs, medicaments and biological substances status: Secondary | ICD-10-CM | POA: Diagnosis not present

## 2023-01-22 DIAGNOSIS — C439 Malignant melanoma of skin, unspecified: Secondary | ICD-10-CM | POA: Diagnosis not present

## 2023-01-23 ENCOUNTER — Encounter: Payer: Self-pay | Admitting: Internal Medicine

## 2023-01-26 ENCOUNTER — Other Ambulatory Visit: Payer: Self-pay | Admitting: Internal Medicine

## 2023-01-26 DIAGNOSIS — T148XXA Other injury of unspecified body region, initial encounter: Secondary | ICD-10-CM

## 2023-01-26 DIAGNOSIS — C439 Malignant melanoma of skin, unspecified: Secondary | ICD-10-CM

## 2023-01-27 ENCOUNTER — Other Ambulatory Visit: Payer: Self-pay | Admitting: Internal Medicine

## 2023-01-27 DIAGNOSIS — C7802 Secondary malignant neoplasm of left lung: Secondary | ICD-10-CM

## 2023-01-28 ENCOUNTER — Encounter: Payer: Self-pay | Admitting: Medical Oncology

## 2023-01-28 ENCOUNTER — Other Ambulatory Visit (INDEPENDENT_AMBULATORY_CARE_PROVIDER_SITE_OTHER): Payer: BC Managed Care – PPO

## 2023-01-28 DIAGNOSIS — T148XXA Other injury of unspecified body region, initial encounter: Secondary | ICD-10-CM | POA: Diagnosis not present

## 2023-01-28 DIAGNOSIS — M5382 Other specified dorsopathies, cervical region: Secondary | ICD-10-CM | POA: Diagnosis not present

## 2023-01-28 DIAGNOSIS — C439 Malignant melanoma of skin, unspecified: Secondary | ICD-10-CM

## 2023-01-28 DIAGNOSIS — S39012A Strain of muscle, fascia and tendon of lower back, initial encounter: Secondary | ICD-10-CM | POA: Diagnosis not present

## 2023-01-28 DIAGNOSIS — M9901 Segmental and somatic dysfunction of cervical region: Secondary | ICD-10-CM | POA: Diagnosis not present

## 2023-01-28 LAB — MAGNESIUM: Magnesium: 2.1 mg/dL (ref 1.5–2.5)

## 2023-02-01 ENCOUNTER — Encounter: Payer: Self-pay | Admitting: Pulmonary Disease

## 2023-02-01 NOTE — Telephone Encounter (Signed)
Mychart message sent by pt:  Leslie [name] [facility] (supporting Garner Nash, DO)11 minutes ago (3:44 PM)    Hello! 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 I hope that Dr Valeta Harms is able to see my most recent scans. Please let me know if this is not the case. Thank you!    Dr. Valeta Harms, please advise.

## 2023-02-02 LAB — OSTEOCALCIN, SERUM: Osteocalcin, N-MID: 17 ng/mL (ref 8–32)

## 2023-02-04 NOTE — Progress Notes (Signed)
Synopsis: Referred in February 2024 for abnormal CT chest by Curt Bears, MD  Subjective:   PATIENT ID: Leslie [name] GENDER: female DOB: 1976-11-02, MRN: [id number]  Chief Complaint  Patient presents with   Consult    Chronic lung issue.    This is a 47 year old female, past medical history of metastatic melanoma stage IV disease with a left upper lobe left lower lobe lung mass and associated solitary brain mass.  Was treated with radiation to the lung has developed radiation induced fibrosis.  Also has osteonecrosis of the ribs.  She is concerned about the abnormality in her CT chest.  I we reviewed her CT imaging dating back for the past 3+ years which is low progression of radiation fibrosis and atelectasis along the left heart border.  We talked about this today in the office.  She occasionally feels like she cannot take as deep of breath on the left side I explained that this is likely normal for her and the fact that she has lost a portion of the lung there and I explained that since it has been so long it is unlikely for that to reinflate.     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 Patient follows with Dr. Curt Bears at [facility] as of 05/06/22 was on 01/12/22 in [place].   Migraines    hx of migraines when pt was younger   Pneumothorax 12/2020   Patient had a chest tube/drain for about 4 days per pt.   Yeast infection      Family History  Problem Relation Age of Onset   Lung cancer Father 39   Stomach cancer Neg Hx    Rectal cancer Neg Hx    Esophageal cancer Neg  Hx    Colon polyps Neg Hx    Colon cancer Neg Hx      Past Surgical History:  Procedure Laterality Date   APPLICATION OF CRANIAL NAVIGATION Left 04/29/2018   Procedure: APPLICATION OF CRANIAL NAVIGATION;  Surgeon: Earnie Larsson, MD;  Location: [place];  Service: Neurosurgery;  Laterality: Left;   CRANIOTOMY Left 04/29/2018   Procedure: LEFT CRANIOTOMY FOR  TUMOR BRAIN LAB;  Surgeon: Earnie Larsson, MD;  Location: [place];  Service: Neurosurgery;  Laterality: Left;   HYSTEROSCOPY WITH D & C N/A 05/07/2022   Procedure: DILATATION AND CURETTAGE /HYSTEROSCOPY/ POLYPECTOMY;  Surgeon: Delsa Bern, MD;  Location: [place];  Service: Gynecology;  Laterality: N/A;   WISDOM TOOTH EXTRACTION  1992    Social History   Socioeconomic History   Marital status: Married    Spouse name: Not on file   Number of children: Not on file   Years of education: Not on file   Highest education level: Not on file  Occupational History   Not on file  Tobacco Use   Smoking status: Never   Smokeless tobacco: Never  Vaping Use   Vaping Use: Never used  Substance and Sexual Activity   Alcohol use: Yes    Alcohol/week: 0.0 - 1.0 standard drinks of alcohol  Comment: one or two drinks per month   Drug use: No   Sexual activity: Yes    Birth control/protection: Condom  Other Topics Concern   Not on file  Social History Narrative   Not on file   Social Determinants of Health   Financial Resource Strain: Not on file  Food Insecurity: Not on file  Transportation Needs: Not on file  Physical Activity: Not on file  Stress: Not on file  Social Connections: Not on file  Intimate Partner Violence: Not At Risk (09/07/2018)   Humiliation, Afraid, Rape, and Kick questionnaire    Fear of Current or Ex-Partner: No    Emotionally Abused: No    Physically Abused: No    Sexually Abused: No     Allergies  Allergen Reactions   Diflucan [Fluconazole] Other (See Comments)    Wheezing and throat  pressue     Outpatient Medications Prior to Visit  Medication Sig Dispense Refill   ADVANCED EVENING PRIMROSE OIL PO Take by mouth daily.     AMERICAN GINSENG PO Take 1,500-3,000 mg by mouth daily. Depending on level of fatigue     Amino Acids (L-CARNITINE) LIQD Take 2,000 mg by mouth daily. Liquid L-Carnitine 2000 mg     Coenzyme Q10 (COQ-10) 100 MG CAPS Take 1 tablet by mouth daily.     CREATINE MONOHYDRATE PO Take 6 g by mouth daily. Creatine MonoHydrate 6 g (on training cycle)     Glucosamine-Chondroit-Vit C-Mn (GLUCOSAMINE 1500 COMPLEX PO) Take 1 tablet by mouth daily. Vegan CMO Glucosamine     Lysine POWD 1,600 mg by Does not apply route daily. L-Lysine Powder 1600 mg     Methylsulfonylmethane (MSM) 1000 MG CAPS Take 4 capsules by mouth daily. 4000 mg (1/2 before workout and 1/2 afterwards)     Multiple Vitamins-Minerals (ZINC PO) Take 1 tablet by mouth daily. Raw Zinc 30 mg (cycle every other week)     NON FORMULARY Take 1 Dose by mouth as needed. Cordyceps 750 mg     NON FORMULARY Take 1 Dose by mouth daily. Curcumin Phytosome (Bioperrine) 3000 mg     pentoxifylline (TRENTAL) 400 MG CR tablet Take 400 mg by mouth daily.     pregabalin (LYRICA) 25 MG capsule Take 25 mg by mouth at bedtime.     SM Omega-3-6-9 Fatty Acids CAPS Take by mouth.     No facility-administered medications prior to visit.    Review of Systems  Constitutional:  Negative for chills, fever, malaise/fatigue and weight loss.  HENT:  Negative for hearing loss, sore throat and tinnitus.   Eyes:  Negative for blurred vision and double vision.  Respiratory:  Positive for shortness of breath. Negative for cough, hemoptysis, sputum production, wheezing and stridor.   Cardiovascular:  Negative for chest pain, palpitations, orthopnea, leg swelling and PND.  Gastrointestinal:  Negative for abdominal pain, constipation, diarrhea, heartburn, nausea and vomiting.  Genitourinary:  Negative for dysuria, hematuria and  urgency.  Musculoskeletal:  Negative for joint pain and myalgias.  Skin:  Negative for itching and rash.  Neurological:  Negative for dizziness, tingling, weakness and headaches.  Endo/Heme/Allergies:  Negative for environmental allergies. Does not bruise/bleed easily.  Psychiatric/Behavioral:  Negative for depression. The patient is not nervous/anxious and does not have insomnia.   All other systems reviewed and are negative.    Objective:  Physical Exam Vitals reviewed.  Constitutional:      General: She is not in acute distress.    Appearance: She  is well-developed.  HENT:     Head: Normocephalic and atraumatic.  Eyes:     General: No scleral icterus.    Conjunctiva/sclera: Conjunctivae normal.     Pupils: Pupils are equal, round, and reactive to light.  Neck:     Vascular: No JVD.     Trachea: No tracheal deviation.  Cardiovascular:     Rate and Rhythm: Normal rate and regular rhythm.     Heart sounds: Normal heart sounds. No murmur heard. Pulmonary:     Effort: Pulmonary effort is normal. No tachypnea, accessory muscle usage or respiratory distress.     Breath sounds: No stridor. No wheezing, rhonchi or rales.  Abdominal:     General: There is no distension.     Palpations: Abdomen is soft.     Tenderness: There is no abdominal tenderness.  Musculoskeletal:        General: No tenderness.     Cervical back: Neck supple.  Lymphadenopathy:     Cervical: No cervical adenopathy.  Skin:    General: Skin is warm and dry.     Capillary Refill: Capillary refill takes less than 2 seconds.     Findings: No rash.  Neurological:     Mental Status: She is alert and oriented to person, place, and time.  Psychiatric:        Behavior: Behavior normal.      Vitals:   02/05/23 1434  BP: 120/80  Pulse: 68  SpO2: 99%  Weight: 147 lb 12.8 oz (67 kg)  Height: '5\' 9"'$  (1.753 m)   99% on RA BMI Readings from Last 3 Encounters:  02/05/23 21.83 kg/m  12/31/22 21.77 kg/m   11/24/22 21.91 kg/m   Wt Readings from Last 3 Encounters:  02/05/23 147 lb 12.8 oz (67 kg)  12/31/22 147 lb 6.4 oz (66.9 kg)  11/24/22 148 lb 6.4 oz (67.3 kg)     CBC    Component Value Date/Time   WBC 5.1 12/31/2022 1026   WBC 5.1 10/28/2022 1336   RBC 4.37 12/31/2022 1026   HGB 13.9 12/31/2022 1026   HCT 39.8 12/31/2022 1026   PLT 227 12/31/2022 1026   MCV 91.1 12/31/2022 1026   MCH 31.8 12/31/2022 1026   MCHC 34.9 12/31/2022 1026   RDW 12.5 12/31/2022 1026   LYMPHSABS 1.2 12/31/2022 1026   MONOABS 0.5 12/31/2022 1026   EOSABS 0.0 12/31/2022 1026   BASOSABS 0.0 12/31/2022 1026     Chest Imaging:  CT imaging January 2024: Focal atelectasis within the left heart border.  History of radiation to the chest.  Radiation induced fibrosis and small area of bronchiectasis. The patient's images have been independently reviewed by me.    Pulmonary Functions Testing Results:     No data to display          FeNO:   Pathology:   Echocardiogram:   Heart Catheterization:     Assessment & Plan:     ICD-10-CM   1. Atelectasis of left lung  J98.11     2. Melanoma metastatic to left lung ([place])  C78.02     3. History of radiation therapy  Z92.3       Discussion: This is a 47 year old female, metastatic melanoma with history of brain metastasis and lung metastasis.  She underwent radiation treatments to the left chest now has an area of radiation-induced fibrosis small area of focal bronchiectasis.  Rib osteo and necrosis related to radiation treatments.  This is led to a  focal area of atelectasis that tracks along the left heart border.  Plan: I did explain we reviewed her CT imaging dating back 3+ years that has showed stability for most years and slight progression of a focal area of atelectasis that has become much more well-formed along the left heart border.  I expect this is related to her radiation treatments. It is unlikely that this area of the lung is  going to reinflate.  Patient was counseled on this.  I do not think there is any limitations for her to be able to exercise and stay active. Actually there is a chance that doing these things will help prevent atelectasis together in the area in the future.  As she gets older she may develop some areas of bronchiectasis that may require intervention to help airway clearance techniques. Incentive spirometry and flutter valve could be used in the future for pulmonary toileting. At this time she seems to be doing fine from a respiratory standpoint.  She is on call us and let us know if anything changes in the future.    Current Outpatient Medications:    ADVANCED EVENING PRIMROSE OIL PO, Take by mouth daily., Disp: , Rfl:    AMERICAN GINSENG PO, Take 1,500-3,000 mg by mouth daily. Depending on level of fatigue, Disp: , Rfl:    Amino Acids (L-CARNITINE) LIQD, Take 2,000 mg by mouth daily. Liquid L-Carnitine 2000 mg, Disp: , Rfl:    Coenzyme Q10 (COQ-10) 100 MG CAPS, Take 1 tablet by mouth daily., Disp: , Rfl:    CREATINE MONOHYDRATE PO, Take 6 g by mouth daily. Creatine MonoHydrate 6 g (on training cycle), Disp: , Rfl:    Glucosamine-Chondroit-Vit C-Mn (GLUCOSAMINE 1500 COMPLEX PO), Take 1 tablet by mouth daily. Vegan CMO Glucosamine, Disp: , Rfl:    Lysine POWD, 1,600 mg by Does not apply route daily. L-Lysine Powder 1600 mg, Disp: , Rfl:    Methylsulfonylmethane (MSM) 1000 MG CAPS, Take 4 capsules by mouth daily. 4000 mg (1/2 before workout and 1/2 afterwards), Disp: , Rfl:    Multiple Vitamins-Minerals (ZINC PO), Take 1 tablet by mouth daily. Raw Zinc 30 mg (cycle every other week), Disp: , Rfl:    NON FORMULARY, Take 1 Dose by mouth as needed. Cordyceps 750 mg, Disp: , Rfl:    NON FORMULARY, Take 1 Dose by mouth daily. Curcumin Phytosome (Bioperrine) 3000 mg, Disp: , Rfl:    pentoxifylline (TRENTAL) 400 MG CR tablet, Take 400 mg by mouth daily., Disp: , Rfl:    pregabalin (LYRICA) 25 MG capsule,  Take 25 mg by mouth at bedtime., Disp: , Rfl:    SM Omega-3-6-9 Fatty Acids CAPS, Take by mouth., Disp: , Rfl:    Garner Nash, DO [facility] 02/05/2023 3:15 PM  t

## 2023-02-05 ENCOUNTER — Encounter: Payer: Self-pay | Admitting: Pulmonary Disease

## 2023-02-05 ENCOUNTER — Ambulatory Visit (INDEPENDENT_AMBULATORY_CARE_PROVIDER_SITE_OTHER): Payer: BC Managed Care – PPO | Admitting: Pulmonary Disease

## 2023-02-05 VITALS — BP 120/80 | HR 68 | Ht 69.0 in | Wt 147.8 lb

## 2023-02-05 DIAGNOSIS — C7802 Secondary malignant neoplasm of left lung: Secondary | ICD-10-CM | POA: Diagnosis not present

## 2023-02-05 DIAGNOSIS — J9811 Atelectasis: Secondary | ICD-10-CM

## 2023-02-05 DIAGNOSIS — Z923 Personal history of irradiation: Secondary | ICD-10-CM | POA: Diagnosis not present

## 2023-02-05 NOTE — Patient Instructions (Signed)
Thank you for visiting Dr. Tihanna Goodson at [facility]. Today we recommend the following:  Return if symptoms worsen or fail to improve.    Please do your part to reduce the spread of COVID-19.  

## 2023-02-09 DIAGNOSIS — Z8781 Personal history of (healed) traumatic fracture: Secondary | ICD-10-CM | POA: Diagnosis not present

## 2023-02-19 DIAGNOSIS — M546 Pain in thoracic spine: Secondary | ICD-10-CM | POA: Diagnosis not present

## 2023-02-22 DIAGNOSIS — S39012A Strain of muscle, fascia and tendon of lower back, initial encounter: Secondary | ICD-10-CM | POA: Diagnosis not present

## 2023-02-22 DIAGNOSIS — M9901 Segmental and somatic dysfunction of cervical region: Secondary | ICD-10-CM | POA: Diagnosis not present

## 2023-02-22 DIAGNOSIS — M5382 Other specified dorsopathies, cervical region: Secondary | ICD-10-CM | POA: Diagnosis not present

## 2023-03-25 DIAGNOSIS — S2249XS Multiple fractures of ribs, unspecified side, sequela: Secondary | ICD-10-CM | POA: Diagnosis not present

## 2023-03-25 DIAGNOSIS — M8580 Other specified disorders of bone density and structure, unspecified site: Secondary | ICD-10-CM | POA: Diagnosis not present

## 2023-03-31 DIAGNOSIS — R0789 Other chest pain: Secondary | ICD-10-CM | POA: Diagnosis not present

## 2023-03-31 DIAGNOSIS — Z923 Personal history of irradiation: Secondary | ICD-10-CM | POA: Diagnosis not present

## 2023-03-31 DIAGNOSIS — M792 Neuralgia and neuritis, unspecified: Secondary | ICD-10-CM | POA: Diagnosis not present

## 2023-03-31 DIAGNOSIS — S2232XG Fracture of one rib, left side, subsequent encounter for fracture with delayed healing: Secondary | ICD-10-CM | POA: Diagnosis not present

## 2023-04-01 DIAGNOSIS — S76111A Strain of right quadriceps muscle, fascia and tendon, initial encounter: Secondary | ICD-10-CM | POA: Diagnosis not present

## 2023-04-01 DIAGNOSIS — M5386 Other specified dorsopathies, lumbar region: Secondary | ICD-10-CM | POA: Diagnosis not present

## 2023-04-01 DIAGNOSIS — M9903 Segmental and somatic dysfunction of lumbar region: Secondary | ICD-10-CM | POA: Diagnosis not present

## 2023-04-21 DIAGNOSIS — Z08 Encounter for follow-up examination after completed treatment for malignant neoplasm: Secondary | ICD-10-CM | POA: Diagnosis not present

## 2023-04-21 DIAGNOSIS — C7931 Secondary malignant neoplasm of brain: Secondary | ICD-10-CM | POA: Diagnosis not present

## 2023-04-21 DIAGNOSIS — Z85118 Personal history of other malignant neoplasm of bronchus and lung: Secondary | ICD-10-CM | POA: Diagnosis not present

## 2023-04-21 DIAGNOSIS — Z923 Personal history of irradiation: Secondary | ICD-10-CM | POA: Diagnosis not present

## 2023-04-22 DIAGNOSIS — L821 Other seborrheic keratosis: Secondary | ICD-10-CM | POA: Diagnosis not present

## 2023-04-22 DIAGNOSIS — C439 Malignant melanoma of skin, unspecified: Secondary | ICD-10-CM | POA: Diagnosis not present

## 2023-04-22 DIAGNOSIS — L578 Other skin changes due to chronic exposure to nonionizing radiation: Secondary | ICD-10-CM | POA: Diagnosis not present

## 2023-04-22 DIAGNOSIS — D225 Melanocytic nevi of trunk: Secondary | ICD-10-CM | POA: Diagnosis not present

## 2023-04-26 DIAGNOSIS — M5386 Other specified dorsopathies, lumbar region: Secondary | ICD-10-CM | POA: Diagnosis not present

## 2023-04-26 DIAGNOSIS — S76111A Strain of right quadriceps muscle, fascia and tendon, initial encounter: Secondary | ICD-10-CM | POA: Diagnosis not present

## 2023-04-26 DIAGNOSIS — M9903 Segmental and somatic dysfunction of lumbar region: Secondary | ICD-10-CM | POA: Diagnosis not present

## 2023-04-27 ENCOUNTER — Encounter: Payer: Self-pay | Admitting: Internal Medicine

## 2023-05-24 ENCOUNTER — Encounter: Payer: Self-pay | Admitting: Medical Oncology

## 2023-06-09 ENCOUNTER — Telehealth: Payer: Self-pay | Admitting: Internal Medicine

## 2023-06-09 NOTE — Telephone Encounter (Signed)
Called patient regarding July appointments, patient is notified.  

## 2023-06-14 DIAGNOSIS — S39012A Strain of muscle, fascia and tendon of lower back, initial encounter: Secondary | ICD-10-CM | POA: Diagnosis not present

## 2023-06-14 DIAGNOSIS — M5382 Other specified dorsopathies, cervical region: Secondary | ICD-10-CM | POA: Diagnosis not present

## 2023-06-14 DIAGNOSIS — M9901 Segmental and somatic dysfunction of cervical region: Secondary | ICD-10-CM | POA: Diagnosis not present

## 2023-06-28 ENCOUNTER — Inpatient Hospital Stay: Payer: BC Managed Care – PPO | Attending: Internal Medicine

## 2023-06-28 ENCOUNTER — Other Ambulatory Visit: Payer: Self-pay

## 2023-06-28 ENCOUNTER — Ambulatory Visit (HOSPITAL_COMMUNITY)
Admission: RE | Admit: 2023-06-28 | Discharge: 2023-06-28 | Disposition: A | Discharge status: Home / Self Care | Payer: BC Managed Care – PPO | Source: Ambulatory Visit | Attending: Internal Medicine | Admitting: Internal Medicine

## 2023-06-28 DIAGNOSIS — N859 Noninflammatory disorder of uterus, unspecified: Secondary | ICD-10-CM | POA: Insufficient documentation

## 2023-06-28 DIAGNOSIS — C7802 Secondary malignant neoplasm of left lung: Secondary | ICD-10-CM | POA: Insufficient documentation

## 2023-06-28 DIAGNOSIS — Z85820 Personal history of malignant melanoma of skin: Secondary | ICD-10-CM | POA: Insufficient documentation

## 2023-06-28 DIAGNOSIS — Z923 Personal history of irradiation: Secondary | ICD-10-CM | POA: Insufficient documentation

## 2023-06-28 DIAGNOSIS — Z9221 Personal history of antineoplastic chemotherapy: Secondary | ICD-10-CM | POA: Insufficient documentation

## 2023-06-28 LAB — CBC WITH DIFFERENTIAL (CANCER CENTER ONLY)
Abs Immature Granulocytes: 0.01 10*3/uL (ref 0.00–0.07)
Basophils Absolute: 0 10*3/uL (ref 0.0–0.1)
Basophils Relative: 1 %
Eosinophils Absolute: 0.1 10*3/uL (ref 0.0–0.5)
Eosinophils Relative: 2 %
HCT: 43.3 % (ref 36.0–46.0)
Hemoglobin: 14.9 g/dL (ref 12.0–15.0)
Immature Granulocytes: 0 %
Lymphocytes Relative: 28 %
Lymphs Abs: 1.1 10*3/uL (ref 0.7–4.0)
MCH: 31.6 pg (ref 26.0–34.0)
MCHC: 34.4 g/dL (ref 30.0–36.0)
MCV: 91.7 fL (ref 80.0–100.0)
Monocytes Absolute: 0.4 10*3/uL (ref 0.1–1.0)
Monocytes Relative: 10 %
Neutro Abs: 2.2 10*3/uL (ref 1.7–7.7)
Neutrophils Relative %: 59 %
Platelet Count: 234 10*3/uL (ref 150–400)
RBC: 4.72 MIL/uL (ref 3.87–5.11)
RDW: 12.5 % (ref 11.5–15.5)
WBC Count: 3.8 10*3/uL — ABNORMAL LOW (ref 4.0–10.5)
nRBC: 0 % (ref 0.0–0.2)

## 2023-06-28 LAB — CMP (CANCER CENTER ONLY)
ALT: 21 U/L (ref 0–44)
AST: 32 U/L (ref 15–41)
Albumin: 4.7 g/dL (ref 3.5–5.0)
Alkaline Phosphatase: 75 U/L (ref 38–126)
Anion gap: 4 — ABNORMAL LOW (ref 5–15)
BUN: 19 mg/dL (ref 6–20)
CO2: 29 mmol/L (ref 22–32)
Calcium: 10 mg/dL (ref 8.9–10.3)
Chloride: 107 mmol/L (ref 98–111)
Creatinine: 1.07 mg/dL — ABNORMAL HIGH (ref 0.44–1.00)
GFR, Estimated: >60 mL/min (ref >60)
Glucose, Bld: 84 mg/dL (ref 70–99)
Potassium: 4.6 mmol/L (ref 3.5–5.1)
Sodium: 140 mmol/L (ref 135–145)
Total Bilirubin: 0.5 mg/dL (ref 0.3–1.2)
Total Protein: 7.2 g/dL (ref 6.5–8.1)

## 2023-06-28 LAB — GLUCOSE, CAPILLARY: Glucose-Capillary: 95 mg/dL (ref 70–99)

## 2023-06-28 MED ORDER — FLUDEOXYGLUCOSE F - 18 (FDG) INJECTION
7.3200 | Freq: Once | INTRAVENOUS | Status: AC
  Administered 2023-06-28: 7.32 via INTRAVENOUS

## 2023-06-30 ENCOUNTER — Ambulatory Visit: Payer: BC Managed Care – PPO | Admitting: Internal Medicine

## 2023-06-30 ENCOUNTER — Inpatient Hospital Stay: Payer: BC Managed Care – PPO | Admitting: Internal Medicine

## 2023-06-30 VITALS — BP 122/83 | HR 70 | Temp 98.0°F | Resp 16 | Wt 145.9 lb

## 2023-06-30 DIAGNOSIS — Z9221 Personal history of antineoplastic chemotherapy: Secondary | ICD-10-CM | POA: Diagnosis not present

## 2023-06-30 DIAGNOSIS — C7802 Secondary malignant neoplasm of left lung: Secondary | ICD-10-CM

## 2023-06-30 DIAGNOSIS — Z923 Personal history of irradiation: Secondary | ICD-10-CM | POA: Diagnosis not present

## 2023-06-30 DIAGNOSIS — N859 Noninflammatory disorder of uterus, unspecified: Secondary | ICD-10-CM | POA: Diagnosis not present

## 2023-06-30 DIAGNOSIS — Z85820 Personal history of malignant melanoma of skin: Secondary | ICD-10-CM | POA: Diagnosis not present

## 2023-06-30 NOTE — Progress Notes (Signed)
[facility] [phone]) [phone]   [phone]  OFFICE PROGRESS NOTE  [name], [name], MD [address] [place] [address]  DIAGNOSIS: Metastatic high-grade neoplasm consistent with metastatic malignant melanoma based on the most recent pathology report from [facility] in October 2019, presented with large left left upper/left lower lobe mass with a hypermetabolic AP window lymph node as well as solitary metastatic brain lesion diagnosed in May 2019.  Biomarker Findings Microsatellite status - MS-Stable Tumor Mutational Burden - TMB-Intermediate (16 Muts/Mb) Genomic Findings For a complete list of the genes assayed, please refer to the Appendix. CD274 (PD-L1) amplification NRAS Q61R PDCD1LG2 (PD-L2) amplification MYC amplification EPHB1 amplification JAK2 amplification RB1 Q93*, W102* TERT promoter -146C>T TP53 C275W   PRIOR THERAPY: 1) left temporal craniotomy with resection of tumor with intraoperative stereotactic guidance for volumetric resection under the care of Dr. Jordan Likes on 04/29/2018. 2) First-line treatment with immunotherapy with Keytruda 200 mg IV every 3 weeks.  First dose June 02, 2018.  Status post 3 cycles.  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 3) palliative radiotherapy to the left lung mass under the care of Dr. Mitzi Hansen completed on 08/24/2018. 4) Resuming her treatment again with Keytruda 200 mg IV every 3 weeks, first dose 09/08/2018.  Status post 2 cycles. 5) Treatment with immunotherapy with ipilimumab 3 mg/KG and nivolumab 1 mg/KG every 3 weeks for the first 4 cycles followed by maintenance nivolumab 240 mg IV every 2 weeks.  First dose of this treatment October 26, 2018.  Status post 40 cycles.  Ipilimumab was discontinued after cycle #2 for the significant liver dysfunction.  Starting from cycle #3 the patient is on treatment with single agent  nivolumab. Starting from cycle #39 the patient is switching to nivolumab 480 mg IV every 4 weeks.  Her treatment is currently on hold since December 02, 2020.  CURRENT THERAPY: Observation.  INTERVAL HISTORY: Leslie [name] 47 y.o. female returns to the clinic today for follow-up visit.  The patient is feeling fine today with no concerning complaints except for occasional thick mucus and inability to take deep breaths at times.  She denied having any current chest pain, shortness of breath or hemoptysis.  She has no nausea, vomiting, diarrhea or constipation.  She has no headache or visual changes.  She denied having any recent weight loss or night sweats.  She has been in observation now for several years and she is doing fine.  She had repeat whole-body PET scan and she is here for evaluation and discussion of her imaging studies and recommendation regarding her condition.  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 Patient follows with Dr. Si Gaul at [facility], LOV as of 05/06/22 was on 01/12/22 in Epic.   Migraines    hx of migraines when pt was younger   Pneumothorax 12/2020   Patient had a chest tube/drain for about 4 days per pt.   Yeast infection     ALLERGIES:  is allergic to diflucan [fluconazole].  MEDICATIONS:  Current Outpatient Medications  Medication Sig Dispense Refill   ADVANCED EVENING PRIMROSE OIL PO Take by mouth daily.     AMERICAN GINSENG PO Take 1,500-3,000 mg by mouth daily. Depending on level of fatigue     Amino Acids (L-CARNITINE) LIQD Take 2,000  mg by mouth daily. Liquid L-Carnitine 2000 mg     Coenzyme Q10 (COQ-10) 100 MG CAPS Take 1 tablet by mouth daily.     CREATINE MONOHYDRATE PO Take 6 g by mouth daily. Creatine MonoHydrate 6 g (on training cycle)     Glucosamine-Chondroit-Vit C-Mn (GLUCOSAMINE 1500 COMPLEX PO) Take 1 tablet by mouth daily. Vegan CMO Glucosamine     Lysine POWD 1,600 mg by Does not apply route daily. L-Lysine Powder 1600 mg     Methylsulfonylmethane (MSM) 1000 MG CAPS Take 4 capsules by mouth daily. 4000 mg (1/2 before workout and 1/2 afterwards)     Multiple Vitamins-Minerals (ZINC PO) Take 1 tablet by mouth daily. Raw Zinc 30 mg (cycle every other week)     NON FORMULARY Take 1 Dose by mouth as needed. Cordyceps 750 mg     NON FORMULARY Take 1 Dose by mouth daily. Curcumin Phytosome (Bioperrine) 3000 mg     pentoxifylline (TRENTAL) 400 MG CR tablet Take 400 mg by mouth daily.     pregabalin (LYRICA) 25 MG capsule Take 25 mg by mouth at bedtime.     SM Omega-3-6-9 Fatty Acids CAPS Take by mouth.     No current facility-administered medications for this visit.    SURGICAL HISTORY:  Past Surgical History:  Procedure Laterality Date   APPLICATION OF CRANIAL NAVIGATION Left 04/29/2018   Procedure: APPLICATION OF CRANIAL NAVIGATION;  Surgeon: Julio Sicks, MD;  Location: [facility] OR;  Service: Neurosurgery;  Laterality: Left;   CRANIOTOMY Left 04/29/2018   Procedure: LEFT CRANIOTOMY FOR  TUMOR BRAIN LAB;  Surgeon: Julio Sicks, MD;  Location: [facility] OR;  Service: Neurosurgery;  Laterality: Left;   HYSTEROSCOPY WITH D & C N/A 05/07/2022   Procedure: DILATATION AND CURETTAGE /HYSTEROSCOPY/ POLYPECTOMY;  Surgeon: Silverio Lay, MD;  Location: [facility];  Service: Gynecology;  Laterality: N/A;   WISDOM TOOTH EXTRACTION  1992    REVIEW OF SYSTEMS:  Constitutional: negative Eyes: negative Ears, nose, mouth, throat, and face: negative Respiratory: positive for sputum Cardiovascular:  negative Gastrointestinal: negative Genitourinary:negative Integument/breast: negative Hematologic/lymphatic: negative Musculoskeletal:negative Neurological: negative Behavioral/Psych: negative Endocrine: negative Allergic/Immunologic: negative   PHYSICAL EXAMINATION: General appearance: alert, cooperative, and no distress Head: Normocephalic, without obvious abnormality, atraumatic Neck: no adenopathy, no JVD, supple, symmetrical, trachea midline, and thyroid not enlarged, symmetric, no tenderness/mass/nodules Lymph nodes: Cervical, supraclavicular, and axillary nodes normal. Resp: clear to auscultation bilaterally Back: negative, symmetric, no curvature. ROM normal. No CVA tenderness. Cardio: regular rate and rhythm, S1, S2 normal, no murmur, click, rub or gallop GI: soft, non-tender; bowel sounds normal; no masses,  no organomegaly Extremities: extremities normal, atraumatic, no cyanosis or edema Neurologic: Alert and oriented X 3, normal strength and tone. Normal symmetric reflexes. Normal coordination and gait  ECOG PERFORMANCE STATUS: 1 - Symptomatic but completely ambulatory  Blood pressure 122/83, pulse 70, temperature 98 F (36.7 C), temperature source Oral, resp. rate 16, weight 145 lb 14.4 oz (66.2 kg), SpO2 100%.  LABORATORY DATA: Lab Results  Component Value Date   WBC 3.8 (L) 06/28/2023   HGB 14.9 06/28/2023   HCT 43.3 06/28/2023   MCV 91.7 06/28/2023   PLT 234 06/28/2023  Chemistry      Component Value Date/Time   NA 140 06/28/2023 0959   K 4.6 06/28/2023 0959   CL 107 06/28/2023 0959   CO2 29 06/28/2023 0959   BUN 19 06/28/2023 0959   CREATININE 1.07 (H) 06/28/2023 0959      Component Value Date/Time   CALCIUM 10.0 06/28/2023 0959   ALKPHOS 75 06/28/2023 0959   AST 32 06/28/2023 0959   ALT 21 06/28/2023 0959   BILITOT 0.5 06/28/2023 0959       RADIOGRAPHIC STUDIES:    ASSESSMENT AND PLAN: This is a very pleasant 47 years old white  female with highly suspicious metastatic malignant melanoma presented with large mass in the left upper/left lower lobe and mediastinal lymphadenopathy as well as solitary brain metastasis status post left temporal craniotomy and resection of tumor on 04/29/2018 and she is recovering well from her surgery. The patient completed stereotactic radiotherapy to the resection cavity next week under the care of Dr. Mitzi Hansen. The patient underwent treatment with immunotherapy with Keytruda 200 mg IV every 3 weeks status post 3 cycles.   Her scan after cycle #3 showed enlargement of the left upper lobe lung mass.  This was also suspicious for pseudo-progression on immunotherapy.  The patient was started on a palliative course of radiotherapy to the left upper lobe lung mass and she tolerated this treatment fairly well.  She was seen by Dr. Leighton Roach at [facility] and she recommended for the patient to resume her treatment with [facility] for now until she undergoes further molecular studies.  The patient was treated with 2 more cycles of Keytruda and tolerated the treatment well. Repeat CT scan of the chest, abdomen and pelvis at that time showed some improvement in the left upper lobe lung mass but there is still concern about pericardial invasion. She is still not a good candidate for surgical resection because of the pericardial invasion. The final pathology report and recommendation from [facility] was consistent with metastatic melanoma and the recommendation is to switch the patient to a combination immunotherapy with Ipilumumab and nivolumab. The patient underwent treatment with immunotherapy with ipilimumab and nivolumab status post 2 cycles.  Her treatment was on hold for more than 6 weeks secondary to grade 3 hepatic dysfunction.  She had improvement of her liver enzyme after a prolonged treatment with a steroid with a tapering schedule. She resumed her treatment again with single agent  nivolumab.  She is status post 40 cycles of treatment. She has tolerated her treatment well with no concerning adverse effects. Starting from cycle #39 the patient was treated with nivolumab 480 mg IV every 4 weeks. The patient is currently on observation since her last dose on December 02, 2020. She is currently on observation and feeling fine with no concerning complaints except for the thick mucus and sometimes inability to take a deep breath. She had repeat PET scan performed recently.  I personally and independently reviewed the PET scan images and discussed the results with the patient today.  Her PET scan showed no concerning findings for disease recurrence or metastasis. I recommended for her to continue on observation with repeat PET scan in 6 months. For the uterine mass, she will reach out to her gynecologist for evaluation. She was advised to call immediately if she has any other concerning symptoms in the interval.  The patient voices understanding of current disease status and treatment options and is in agreement with the current care plan. All questions were  answered. The patient knows to call the clinic with any problems, questions or concerns. We can certainly see the patient much sooner if necessary.  Disclaimer: This note was dictated with voice recognition software. Similar sounding words can inadvertently be transcribed and may not be corrected upon review.

## 2023-07-05 DIAGNOSIS — S39012A Strain of muscle, fascia and tendon of lower back, initial encounter: Secondary | ICD-10-CM | POA: Diagnosis not present

## 2023-07-05 DIAGNOSIS — M5382 Other specified dorsopathies, cervical region: Secondary | ICD-10-CM | POA: Diagnosis not present

## 2023-07-05 DIAGNOSIS — M9901 Segmental and somatic dysfunction of cervical region: Secondary | ICD-10-CM | POA: Diagnosis not present

## 2023-07-07 DIAGNOSIS — N840 Polyp of corpus uteri: Secondary | ICD-10-CM | POA: Diagnosis not present

## 2023-07-27 DIAGNOSIS — Z8781 Personal history of (healed) traumatic fracture: Secondary | ICD-10-CM | POA: Diagnosis not present

## 2023-07-27 DIAGNOSIS — C7802 Secondary malignant neoplasm of left lung: Secondary | ICD-10-CM | POA: Diagnosis not present

## 2023-07-30 DIAGNOSIS — Z1331 Encounter for screening for depression: Secondary | ICD-10-CM | POA: Diagnosis not present

## 2023-07-30 DIAGNOSIS — Z01419 Encounter for gynecological examination (general) (routine) without abnormal findings: Secondary | ICD-10-CM | POA: Diagnosis not present

## 2023-07-30 DIAGNOSIS — Z1231 Encounter for screening mammogram for malignant neoplasm of breast: Secondary | ICD-10-CM | POA: Diagnosis not present

## 2023-07-30 DIAGNOSIS — Z139 Encounter for screening, unspecified: Secondary | ICD-10-CM | POA: Diagnosis not present

## 2023-07-30 LAB — HM MAMMOGRAPHY

## 2023-08-17 DIAGNOSIS — M5386 Other specified dorsopathies, lumbar region: Secondary | ICD-10-CM | POA: Diagnosis not present

## 2023-08-17 DIAGNOSIS — M7611 Psoas tendinitis, right hip: Secondary | ICD-10-CM | POA: Diagnosis not present

## 2023-08-17 DIAGNOSIS — M9903 Segmental and somatic dysfunction of lumbar region: Secondary | ICD-10-CM | POA: Diagnosis not present

## 2023-08-17 DIAGNOSIS — M9901 Segmental and somatic dysfunction of cervical region: Secondary | ICD-10-CM | POA: Diagnosis not present

## 2023-09-10 DIAGNOSIS — R9389 Abnormal findings on diagnostic imaging of other specified body structures: Secondary | ICD-10-CM | POA: Diagnosis not present

## 2023-09-13 DIAGNOSIS — M9903 Segmental and somatic dysfunction of lumbar region: Secondary | ICD-10-CM | POA: Diagnosis not present

## 2023-09-13 DIAGNOSIS — M7611 Psoas tendinitis, right hip: Secondary | ICD-10-CM | POA: Diagnosis not present

## 2023-09-13 DIAGNOSIS — M9901 Segmental and somatic dysfunction of cervical region: Secondary | ICD-10-CM | POA: Diagnosis not present

## 2023-09-13 DIAGNOSIS — M5386 Other specified dorsopathies, lumbar region: Secondary | ICD-10-CM | POA: Diagnosis not present

## 2023-09-21 DIAGNOSIS — R9389 Abnormal findings on diagnostic imaging of other specified body structures: Secondary | ICD-10-CM | POA: Diagnosis not present

## 2023-10-11 DIAGNOSIS — U071 COVID-19: Secondary | ICD-10-CM | POA: Diagnosis not present

## 2023-10-11 DIAGNOSIS — R059 Cough, unspecified: Secondary | ICD-10-CM | POA: Diagnosis not present

## 2023-10-11 DIAGNOSIS — J029 Acute pharyngitis, unspecified: Secondary | ICD-10-CM | POA: Diagnosis not present

## 2023-10-11 DIAGNOSIS — R0981 Nasal congestion: Secondary | ICD-10-CM | POA: Diagnosis not present

## 2023-10-15 DIAGNOSIS — Z8616 Personal history of COVID-19: Secondary | ICD-10-CM | POA: Diagnosis not present

## 2023-10-15 DIAGNOSIS — H1031 Unspecified acute conjunctivitis, right eye: Secondary | ICD-10-CM | POA: Diagnosis not present

## 2023-10-28 DIAGNOSIS — L821 Other seborrheic keratosis: Secondary | ICD-10-CM | POA: Diagnosis not present

## 2023-10-28 DIAGNOSIS — L578 Other skin changes due to chronic exposure to nonionizing radiation: Secondary | ICD-10-CM | POA: Diagnosis not present

## 2023-10-28 DIAGNOSIS — D225 Melanocytic nevi of trunk: Secondary | ICD-10-CM | POA: Diagnosis not present

## 2023-10-28 DIAGNOSIS — C439 Malignant melanoma of skin, unspecified: Secondary | ICD-10-CM | POA: Diagnosis not present

## 2023-11-01 ENCOUNTER — Encounter: Payer: Self-pay | Admitting: Internal Medicine

## 2023-11-01 ENCOUNTER — Ambulatory Visit (INDEPENDENT_AMBULATORY_CARE_PROVIDER_SITE_OTHER): Payer: BC Managed Care – PPO | Admitting: Internal Medicine

## 2023-11-01 VITALS — BP 110/70 | HR 64 | Temp 97.6°F | Ht 68.5 in | Wt 145.4 lb

## 2023-11-01 DIAGNOSIS — Z23 Encounter for immunization: Secondary | ICD-10-CM | POA: Diagnosis not present

## 2023-11-01 DIAGNOSIS — Z1159 Encounter for screening for other viral diseases: Secondary | ICD-10-CM | POA: Diagnosis not present

## 2023-11-01 DIAGNOSIS — Z0000 Encounter for general adult medical examination without abnormal findings: Secondary | ICD-10-CM

## 2023-11-01 DIAGNOSIS — Z114 Encounter for screening for human immunodeficiency virus [HIV]: Secondary | ICD-10-CM

## 2023-11-01 LAB — CBC WITH DIFFERENTIAL/PLATELET
Basophils Absolute: 0 10*3/uL (ref 0.0–0.1)
Basophils Relative: 0.9 % (ref 0.0–3.0)
Eosinophils Absolute: 0.1 10*3/uL (ref 0.0–0.7)
Eosinophils Relative: 1.4 % (ref 0.0–5.0)
HCT: 40.3 % (ref 36.0–46.0)
Hemoglobin: 13.4 g/dL (ref 12.0–15.0)
Lymphocytes Relative: 19.1 % (ref 12.0–46.0)
Lymphs Abs: 0.9 10*3/uL (ref 0.7–4.0)
MCHC: 33.3 g/dL (ref 30.0–36.0)
MCV: 92.6 fL (ref 78.0–100.0)
Monocytes Absolute: 0.5 10*3/uL (ref 0.1–1.0)
Monocytes Relative: 10 % (ref 3.0–12.0)
Neutro Abs: 3.4 10*3/uL (ref 1.4–7.7)
Neutrophils Relative %: 68.6 % (ref 43.0–77.0)
Platelets: 332 10*3/uL (ref 150.0–400.0)
RBC: 4.36 Mil/uL (ref 3.87–5.11)
RDW: 13.1 % (ref 11.5–15.5)
WBC: 4.9 10*3/uL (ref 4.0–10.5)

## 2023-11-01 LAB — LIPID PANEL
Cholesterol: 134 mg/dL (ref 0–200)
HDL: 54.2 mg/dL (ref >39.00)
LDL Cholesterol: 66 mg/dL (ref 0–99)
NonHDL: 79.39
Total CHOL/HDL Ratio: 2
Triglycerides: 68 mg/dL (ref 0.0–149.0)
VLDL: 13.6 mg/dL (ref 0.0–40.0)

## 2023-11-01 LAB — COMPREHENSIVE METABOLIC PANEL
ALT: 20 U/L (ref 0–35)
AST: 32 U/L (ref 0–37)
Albumin: 4.4 g/dL (ref 3.5–5.2)
Alkaline Phosphatase: 58 U/L (ref 39–117)
BUN: 21 mg/dL (ref 6–23)
CO2: 27 meq/L (ref 19–32)
Calcium: 9.8 mg/dL (ref 8.4–10.5)
Chloride: 104 meq/L (ref 96–112)
Creatinine, Ser: 0.92 mg/dL (ref 0.40–1.20)
GFR: 73.88 mL/min (ref >60.00)
Glucose, Bld: 77 mg/dL (ref 70–99)
Potassium: 5.3 meq/L — ABNORMAL HIGH (ref 3.5–5.1)
Sodium: 138 meq/L (ref 135–145)
Total Bilirubin: 0.5 mg/dL (ref 0.2–1.2)
Total Protein: 7.1 g/dL (ref 6.0–8.3)

## 2023-11-01 LAB — VITAMIN D 25 HYDROXY (VIT D DEFICIENCY, FRACTURES): VITD: 64.5 ng/mL (ref 30.00–100.00)

## 2023-11-01 LAB — VITAMIN B12: Vitamin B-12: 843 pg/mL (ref 211–911)

## 2023-11-01 LAB — TSH: TSH: 1.78 u[IU]/mL (ref 0.35–5.50)

## 2023-11-01 NOTE — Progress Notes (Signed)
Established Patient Office Visit     CC/Reason for Visit: Annual preventive exam  HPI: Leslie [name] is a 46 y.o. female who is coming in today for the above mentioned reasons. Past Medical History is significant for: Metastatic melanoma to the brain who has been doing well with no sign of disease progression.  Has routine eye and dental care.  Cancer screening is up-to-date.  All immunizations are up-to-date.  She is requesting hep B vaccination.   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 Patient follows with Dr. Si Gaul at [facility], LOV as of 05/06/22 was on 01/12/22 in Epic.   Migraines    hx of migraines when pt was younger   Pneumothorax 12/2020   Patient had a chest tube/drain for about 4 days per pt.   Yeast infection     Past Surgical History:  Procedure Laterality Date   APPLICATION OF CRANIAL NAVIGATION Left 04/29/2018   Procedure: APPLICATION OF CRANIAL NAVIGATION;  Surgeon: Julio Sicks, MD;  Location: [facility];  Service: Neurosurgery;  Laterality: Left;   CRANIOTOMY Left 04/29/2018   Procedure: LEFT CRANIOTOMY FOR  TUMOR BRAIN LAB;  Surgeon: Julio Sicks, MD;  Location: [facility];  Service: Neurosurgery;  Laterality: Left;   HYSTEROSCOPY WITH D & C N/A 05/07/2022   Procedure: DILATATION AND CURETTAGE /HYSTEROSCOPY/ POLYPECTOMY;  Surgeon: Silverio Lay, MD;  Location: [facility];  Service: Gynecology;  Laterality: N/A;   WISDOM TOOTH EXTRACTION  1992    Social History:  reports that she has never smoked. She has never used  smokeless tobacco. She reports current alcohol use. She reports that she does not use drugs.  Allergies: Allergies  Allergen Reactions   Diflucan [Fluconazole] Other (See Comments)    Wheezing and throat pressue    Family History:  Family History  Problem Relation Age of Onset   Lung cancer Father 21   Stomach cancer Neg Hx    Rectal cancer Neg Hx    Esophageal cancer Neg Hx    Colon polyps Neg Hx    Colon cancer Neg Hx      Current Outpatient Medications:    AMERICAN GINSENG PO, Take 1,500-3,000 mg by mouth daily. Depending on level of fatigue, Disp: , Rfl:    CALCIUM-MAGNESIUM-VITAMIN D ER PO, , Disp: , Rfl:    Coenzyme Q10 (COQ-10) 100 MG CAPS, Take 1 tablet by mouth daily., Disp: , Rfl:    Methylsulfonylmethane (MSM) 1000 MG CAPS, Take 4 capsules by mouth daily. 4000 mg (1/2 before workout and 1/2 afterwards), Disp: , Rfl:    NON FORMULARY, Take 1 Dose by mouth as needed. Cordyceps 750 mg, Disp: , Rfl:    NON FORMULARY, Take 1 Dose by mouth daily. Curcumin Phytosome (Bioperrine) 3000 mg, Disp: , Rfl:    SM Omega-3-6-9 Fatty Acids CAPS, Take by mouth., Disp: , Rfl:   Review of Systems:  Negative unless indicated in HPI.   Physical Exam: Vitals:   11/01/23 0814  BP: 110/70  Pulse: 64  Temp: 97.6 F (  36.4 C)  TempSrc: Oral  SpO2: 99%  Weight: 145 lb 6.4 oz (66 kg)  Height: 5' 8.5" (1.74 m)    Body mass index is 21.79 kg/m.   Physical Exam Vitals reviewed.  Constitutional:      General: She is not in acute distress.    Appearance: Normal appearance. She is not ill-appearing, toxic-appearing or diaphoretic.  HENT:     Head: Normocephalic.     Right Ear: Tympanic membrane, ear canal and external ear normal. There is no impacted cerumen.     Left Ear: Tympanic membrane, ear canal and external ear normal. There is no impacted cerumen.     Nose: Nose normal.     Mouth/Throat:     Mouth: Mucous membranes are moist.     Pharynx: Oropharynx is clear. No  oropharyngeal exudate or posterior oropharyngeal erythema.  Eyes:     General: No scleral icterus.       Right eye: No discharge.        Left eye: No discharge.     Conjunctiva/sclera: Conjunctivae normal.     Pupils: Pupils are equal, round, and reactive to light.  Neck:     Vascular: No carotid bruit.  Cardiovascular:     Rate and Rhythm: Normal rate and regular rhythm.     Pulses: Normal pulses.     Heart sounds: Normal heart sounds.  Pulmonary:     Effort: Pulmonary effort is normal. No respiratory distress.     Breath sounds: Normal breath sounds.  Abdominal:     General: Abdomen is flat. Bowel sounds are normal.     Palpations: Abdomen is soft.  Musculoskeletal:        General: Normal range of motion.     Cervical back: Normal range of motion.  Skin:    General: Skin is warm and dry.  Neurological:     General: No focal deficit present.     Mental Status: She is alert and oriented to person, place, and time. Mental status is at baseline.  Psychiatric:        Mood and Affect: Mood normal.        Behavior: Behavior normal.        Thought Content: Thought content normal.        Judgment: Judgment normal.     Flowsheet Row Office Visit from 11/01/2023 in [facility] [place]  PHQ-9 Total Score 3        Impression and Plan:  Encounter for preventive health examination -     CBC with Differential/Platelet; Future -     Comprehensive metabolic panel; Future -     Lipid panel; Future -     TSH; Future -     Vitamin B12; Future -     VITAMIN D 25 Hydroxy (Vit-D Deficiency, Fractures); Future  Immunization due  Encounter for hepatitis C screening test for low risk patient -     Hepatitis C antibody; Future  Encounter for screening for HIV -     HIV Antibody (routine testing w rflx); Future     -Recommend routine eye and dental care. -Healthy lifestyle discussed in detail. -Labs to be updated today. -Prostate cancer screening: Not  applicable Health Maintenance  Topic Date Due   HIV Screening  Never done   Hepatitis C Screening  Never done   Pap with HPV screening  12/17/2015   Colon Cancer Screening  Never done   COVID-19 Vaccine (8 - 2023-24 season)  11/08/2023   DTaP/Tdap/Td vaccine (3 - Td or Tdap) 11/12/2030   Flu Shot  Completed   HPV Vaccine  Aged Out     -First hep B vaccine administered in office today.    Chaya Jan, MD [facility]

## 2023-11-01 NOTE — Addendum Note (Signed)
Addended by: Kern Reap B on: 11/01/2023 10:02 AM   Modules accepted: Orders

## 2023-11-02 ENCOUNTER — Encounter: Payer: Self-pay | Admitting: Internal Medicine

## 2023-11-02 DIAGNOSIS — M7612 Psoas tendinitis, left hip: Secondary | ICD-10-CM | POA: Diagnosis not present

## 2023-11-02 DIAGNOSIS — M9901 Segmental and somatic dysfunction of cervical region: Secondary | ICD-10-CM | POA: Diagnosis not present

## 2023-11-02 DIAGNOSIS — M5382 Other specified dorsopathies, cervical region: Secondary | ICD-10-CM | POA: Diagnosis not present

## 2023-11-03 DIAGNOSIS — M25552 Pain in left hip: Secondary | ICD-10-CM | POA: Diagnosis not present

## 2023-11-03 LAB — HIV ANTIBODY (ROUTINE TESTING W REFLEX): HIV 1&2 Ab, 4th Generation: NONREACTIVE

## 2023-11-03 LAB — HEPATITIS C ANTIBODY: Hepatitis C Ab: NONREACTIVE

## 2023-11-23 DIAGNOSIS — M5382 Other specified dorsopathies, cervical region: Secondary | ICD-10-CM | POA: Diagnosis not present

## 2023-11-23 DIAGNOSIS — M9901 Segmental and somatic dysfunction of cervical region: Secondary | ICD-10-CM | POA: Diagnosis not present

## 2023-11-23 DIAGNOSIS — M7612 Psoas tendinitis, left hip: Secondary | ICD-10-CM | POA: Diagnosis not present

## 2023-11-29 ENCOUNTER — Encounter: Payer: Self-pay | Admitting: Internal Medicine

## 2023-12-06 DIAGNOSIS — C7931 Secondary malignant neoplasm of brain: Secondary | ICD-10-CM | POA: Diagnosis not present

## 2023-12-06 DIAGNOSIS — Z08 Encounter for follow-up examination after completed treatment for malignant neoplasm: Secondary | ICD-10-CM | POA: Diagnosis not present

## 2023-12-06 DIAGNOSIS — Z9889 Other specified postprocedural states: Secondary | ICD-10-CM | POA: Diagnosis not present

## 2023-12-06 DIAGNOSIS — Z85841 Personal history of malignant neoplasm of brain: Secondary | ICD-10-CM | POA: Diagnosis not present

## 2023-12-06 DIAGNOSIS — C439 Malignant melanoma of skin, unspecified: Secondary | ICD-10-CM | POA: Diagnosis not present

## 2023-12-13 ENCOUNTER — Inpatient Hospital Stay: Payer: BC Managed Care – PPO | Attending: Internal Medicine

## 2023-12-13 DIAGNOSIS — Z85820 Personal history of malignant melanoma of skin: Secondary | ICD-10-CM | POA: Diagnosis not present

## 2023-12-13 DIAGNOSIS — C7802 Secondary malignant neoplasm of left lung: Secondary | ICD-10-CM

## 2023-12-13 LAB — CBC WITH DIFFERENTIAL (CANCER CENTER ONLY)
Abs Immature Granulocytes: 0 10*3/uL (ref 0.00–0.07)
Basophils Absolute: 0 10*3/uL (ref 0.0–0.1)
Basophils Relative: 1 %
Eosinophils Absolute: 0.1 10*3/uL (ref 0.0–0.5)
Eosinophils Relative: 3 %
HCT: 42.3 % (ref 36.0–46.0)
Hemoglobin: 14 g/dL (ref 12.0–15.0)
Immature Granulocytes: 0 %
Lymphocytes Relative: 29 %
Lymphs Abs: 0.8 10*3/uL (ref 0.7–4.0)
MCH: 30.4 pg (ref 26.0–34.0)
MCHC: 33.1 g/dL (ref 30.0–36.0)
MCV: 92 fL (ref 80.0–100.0)
Monocytes Absolute: 0.3 10*3/uL (ref 0.1–1.0)
Monocytes Relative: 11 %
Neutro Abs: 1.6 10*3/uL — ABNORMAL LOW (ref 1.7–7.7)
Neutrophils Relative %: 56 %
Platelet Count: 224 10*3/uL (ref 150–400)
RBC: 4.6 MIL/uL (ref 3.87–5.11)
RDW: 12.8 % (ref 11.5–15.5)
WBC Count: 2.8 10*3/uL — ABNORMAL LOW (ref 4.0–10.5)
nRBC: 0 % (ref 0.0–0.2)

## 2023-12-13 LAB — CMP (CANCER CENTER ONLY)
ALT: 18 U/L (ref 0–44)
AST: 26 U/L (ref 15–41)
Albumin: 4.4 g/dL (ref 3.5–5.0)
Alkaline Phosphatase: 61 U/L (ref 38–126)
Anion gap: 4 — ABNORMAL LOW (ref 5–15)
BUN: 18 mg/dL (ref 6–20)
CO2: 32 mmol/L (ref 22–32)
Calcium: 10 mg/dL (ref 8.9–10.3)
Chloride: 104 mmol/L (ref 98–111)
Creatinine: 0.99 mg/dL (ref 0.44–1.00)
GFR, Estimated: >60 mL/min (ref >60)
Glucose, Bld: 83 mg/dL (ref 70–99)
Potassium: 5 mmol/L (ref 3.5–5.1)
Sodium: 140 mmol/L (ref 135–145)
Total Bilirubin: 0.3 mg/dL (ref <1.2)
Total Protein: 7.2 g/dL (ref 6.5–8.1)

## 2023-12-13 LAB — LACTATE DEHYDROGENASE: LDH: 152 U/L (ref 98–192)

## 2023-12-17 ENCOUNTER — Other Ambulatory Visit: Payer: Self-pay | Admitting: Medical Genetics

## 2023-12-17 ENCOUNTER — Ambulatory Visit (HOSPITAL_COMMUNITY)
Admission: RE | Admit: 2023-12-17 | Discharge: 2023-12-17 | Disposition: A | Discharge status: Home / Self Care | Payer: BC Managed Care – PPO | Source: Ambulatory Visit | Attending: Internal Medicine | Admitting: Internal Medicine

## 2023-12-17 DIAGNOSIS — C4359 Malignant melanoma of other part of trunk: Secondary | ICD-10-CM | POA: Diagnosis not present

## 2023-12-17 DIAGNOSIS — C7802 Secondary malignant neoplasm of left lung: Secondary | ICD-10-CM | POA: Diagnosis not present

## 2023-12-17 LAB — GLUCOSE, CAPILLARY: Glucose-Capillary: 87 mg/dL (ref 70–99)

## 2023-12-17 MED ORDER — FLUDEOXYGLUCOSE F - 18 (FDG) INJECTION
7.3000 | Freq: Once | INTRAVENOUS | Status: DC | PRN
Start: 2023-12-17 — End: 2023-12-23

## 2023-12-22 ENCOUNTER — Encounter: Payer: Self-pay | Admitting: Internal Medicine

## 2023-12-22 ENCOUNTER — Ambulatory Visit
Admission: RE | Admit: 2023-12-22 | Discharge: 2023-12-22 | Disposition: A | Discharge status: Home / Self Care | Payer: BC Managed Care – PPO | Source: Ambulatory Visit | Attending: Radiation Oncology | Admitting: Radiation Oncology

## 2023-12-22 ENCOUNTER — Other Ambulatory Visit: Payer: Self-pay | Admitting: Radiation Oncology

## 2023-12-22 DIAGNOSIS — S299XXA Unspecified injury of thorax, initial encounter: Secondary | ICD-10-CM

## 2023-12-22 DIAGNOSIS — S2242XA Multiple fractures of ribs, left side, initial encounter for closed fracture: Secondary | ICD-10-CM | POA: Diagnosis not present

## 2023-12-22 DIAGNOSIS — R0781 Pleurodynia: Secondary | ICD-10-CM | POA: Diagnosis not present

## 2023-12-27 ENCOUNTER — Other Ambulatory Visit (HOSPITAL_COMMUNITY): Payer: BC Managed Care – PPO

## 2023-12-27 ENCOUNTER — Inpatient Hospital Stay: Payer: BC Managed Care – PPO

## 2023-12-29 ENCOUNTER — Other Ambulatory Visit: Payer: Self-pay | Admitting: Medical Oncology

## 2023-12-29 DIAGNOSIS — C7802 Secondary malignant neoplasm of left lung: Secondary | ICD-10-CM

## 2023-12-30 ENCOUNTER — Inpatient Hospital Stay: Payer: BC Managed Care – PPO | Attending: Internal Medicine | Admitting: Internal Medicine

## 2023-12-30 ENCOUNTER — Inpatient Hospital Stay: Payer: BC Managed Care – PPO

## 2023-12-30 VITALS — BP 117/77 | HR 70 | Temp 98.4°F | Resp 17 | Wt 139.6 lb

## 2023-12-30 DIAGNOSIS — Z9221 Personal history of antineoplastic chemotherapy: Secondary | ICD-10-CM | POA: Diagnosis not present

## 2023-12-30 DIAGNOSIS — C3432 Malignant neoplasm of lower lobe, left bronchus or lung: Secondary | ICD-10-CM | POA: Diagnosis not present

## 2023-12-30 DIAGNOSIS — Z85820 Personal history of malignant melanoma of skin: Secondary | ICD-10-CM | POA: Insufficient documentation

## 2023-12-30 DIAGNOSIS — C7802 Secondary malignant neoplasm of left lung: Secondary | ICD-10-CM

## 2023-12-30 DIAGNOSIS — Z923 Personal history of irradiation: Secondary | ICD-10-CM | POA: Diagnosis not present

## 2023-12-30 LAB — CBC WITH DIFFERENTIAL (CANCER CENTER ONLY)
Abs Immature Granulocytes: 0 10*3/uL (ref 0.00–0.07)
Basophils Absolute: 0 10*3/uL (ref 0.0–0.1)
Basophils Relative: 1 %
Eosinophils Absolute: 0.1 10*3/uL (ref 0.0–0.5)
Eosinophils Relative: 1 %
HCT: 40.9 % (ref 36.0–46.0)
Hemoglobin: 14 g/dL (ref 12.0–15.0)
Immature Granulocytes: 0 %
Lymphocytes Relative: 21 %
Lymphs Abs: 1.2 10*3/uL (ref 0.7–4.0)
MCH: 30.6 pg (ref 26.0–34.0)
MCHC: 34.2 g/dL (ref 30.0–36.0)
MCV: 89.3 fL (ref 80.0–100.0)
Monocytes Absolute: 0.7 10*3/uL (ref 0.1–1.0)
Monocytes Relative: 11 %
Neutro Abs: 3.9 10*3/uL (ref 1.7–7.7)
Neutrophils Relative %: 66 %
Platelet Count: 267 10*3/uL (ref 150–400)
RBC: 4.58 MIL/uL (ref 3.87–5.11)
RDW: 12.5 % (ref 11.5–15.5)
WBC Count: 5.8 10*3/uL (ref 4.0–10.5)
nRBC: 0 % (ref 0.0–0.2)

## 2023-12-30 LAB — CMP (CANCER CENTER ONLY)
ALT: 16 U/L (ref 0–44)
AST: 28 U/L (ref 15–41)
Albumin: 4.6 g/dL (ref 3.5–5.0)
Alkaline Phosphatase: 75 U/L (ref 38–126)
Anion gap: 4 — ABNORMAL LOW (ref 5–15)
BUN: 22 mg/dL — ABNORMAL HIGH (ref 6–20)
CO2: 33 mmol/L — ABNORMAL HIGH (ref 22–32)
Calcium: 9.8 mg/dL (ref 8.9–10.3)
Chloride: 104 mmol/L (ref 98–111)
Creatinine: 0.95 mg/dL (ref 0.44–1.00)
GFR, Estimated: >60 mL/min (ref >60)
Glucose, Bld: 88 mg/dL (ref 70–99)
Potassium: 4.7 mmol/L (ref 3.5–5.1)
Sodium: 141 mmol/L (ref 135–145)
Total Bilirubin: 0.4 mg/dL (ref 0.0–1.2)
Total Protein: 7.4 g/dL (ref 6.5–8.1)

## 2023-12-30 LAB — LACTATE DEHYDROGENASE: LDH: 160 U/L (ref 98–192)

## 2023-12-30 NOTE — Progress Notes (Signed)
[name] [phone]   [phone]  OFFICE PROGRESS NOTE  [name], [name], MD [address] [place] [address]  DIAGNOSIS: Metastatic high-grade neoplasm consistent with metastatic malignant melanoma based on the most recent pathology report from [facility] in October 2019, presented with large left left upper/left lower lobe mass with a hypermetabolic AP window lymph node as well as solitary metastatic brain lesion diagnosed in May 2019.  Biomarker Findings Microsatellite status - MS-Stable Tumor Mutational Burden - TMB-Intermediate (16 Muts/Mb) Genomic Findings For a complete list of the genes assayed, please refer to the Appendix. CD274 (PD-L1) amplification NRAS Q61R PDCD1LG2 (PD-L2) amplification MYC amplification EPHB1 amplification JAK2 amplification RB1 Q93*, K742* TERT promoter -146C>T TP53 C275W   PRIOR THERAPY: 1) left temporal craniotomy with resection of tumor with intraoperative stereotactic guidance for volumetric resection under the care of Dr. Jordan Likes on 04/29/2018. 2) First-line treatment with immunotherapy with Keytruda 200 mg IV every 3 weeks.  First dose June 02, 2018.  Status post 3 cycles.  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 3) palliative radiotherapy to the left lung mass under the care of Dr. Mitzi Hansen completed on 08/24/2018. 4) Resuming her treatment again with Keytruda 200 mg IV every 3 weeks, first dose 09/08/2018.  Status post 2 cycles. 5) Treatment with immunotherapy with ipilimumab 3 mg/KG and nivolumab 1 mg/KG every 3 weeks for the first 4 cycles followed by maintenance nivolumab 240 mg IV every 2 weeks.  First dose of this treatment October 26, 2018.  Status post 40 cycles.  Ipilimumab was discontinued after cycle #2 for the significant liver dysfunction.  Starting from cycle #3 the patient is on treatment with single agent  nivolumab. Starting from cycle #39 the patient is switching to nivolumab 480 mg IV every 4 weeks.  Her treatment is currently on hold since December 02, 2020.  CURRENT THERAPY: Observation.  INTERVAL HISTORY: Leslie [name] 48 y.o. female returns to the clinic today for 67-month follow-up visit.Discussed the use of AI scribe software for clinical note transcription with the patient, who gave verbal consent to proceed.  History of Present Illness   Leslie [name], a 48 year old patient with a history of metastatic melanoma, presents for a follow-up consultation. The patient underwent a brain metastasis resection in May 2019, followed by a series of immunotherapies, including Ipilimumab and Nivolumab. The treatment was completed in December 2021, and the patient has been under observation since then.  Over the past six months, the patient has experienced unexpected complications, including recurrent rib fractures and atelectasis from previous radiation therapy. Despite these complications, the patient's pulmonary condition has reportedly improved. However, in December of the previous year, the patient experienced has rib fractures of undetermined age on CXR but not PET scan. The patient also reports persistent discomfort due to the fracture.  In addition to the rib fractures, the patient has been experiencing issues with her knees and hip. The patient attempted to alleviate these symptoms through warm water therapy at a recovery pool but discontinued due to a lack of improvement. The patient also experienced a sudden rib fracture while performing a simple task at home.  The patient has been consulting with a cardiothoracic surgeon regarding the recurrent rib fractures. The surgeon suggested surgical intervention to fix the ribs, but the patient is concerned about the longevity of the solution. The patient also reports bone loss and the onset of perimenopause.  The patient has  been  experiencing persistent mouth sores, which worsen with the consumption of sweet foods. The patient also reports an increase in mucus production. Despite these symptoms, the patient's melanoma remains under control with no activity observed on the PET scan. The patient is anxious about these symptoms and is seeking advice on managing them while maintaining an active lifestyle.        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 Patient follows with Dr. Si Gaul at [facility], LOV as of 05/06/22 was on 01/12/22 in Epic.   Migraines    hx of migraines when pt was younger   Pneumothorax 12/2020   Patient had a chest tube/drain for about 4 days per pt.   Yeast infection     ALLERGIES:  is allergic to diflucan [fluconazole].  MEDICATIONS:  Current Outpatient Medications  Medication Sig Dispense Refill   AMERICAN GINSENG PO Take 1,500-3,000 mg by mouth daily. Depending on level of fatigue     CALCIUM-MAGNESIUM-VITAMIN D ER PO      Coenzyme Q10 (COQ-10) 100 MG CAPS Take 1 tablet by mouth daily.     Methylsulfonylmethane (MSM) 1000 MG CAPS Take 4 capsules by mouth daily. 4000 mg (1/2 before workout and 1/2 afterwards)     NON FORMULARY Take 1 Dose by mouth as needed. Cordyceps 750 mg     NON FORMULARY Take 1 Dose by mouth daily. Curcumin Phytosome (Bioperrine) 3000 mg     SM Omega-3-6-9 Fatty Acids CAPS Take by mouth.     No current facility-administered medications for this visit.    SURGICAL HISTORY:  Past Surgical History:  Procedure Laterality Date   APPLICATION OF CRANIAL  NAVIGATION Left 04/29/2018   Procedure: APPLICATION OF CRANIAL NAVIGATION;  Surgeon: Julio Sicks, MD;  Location: [facility] OR;  Service: Neurosurgery;  Laterality: Left;   CRANIOTOMY Left 04/29/2018   Procedure: LEFT CRANIOTOMY FOR  TUMOR BRAIN LAB;  Surgeon: Julio Sicks, MD;  Location: [facility] OR;  Service: Neurosurgery;  Laterality: Left;   HYSTEROSCOPY WITH D & C N/A 05/07/2022   Procedure: DILATATION AND CURETTAGE /HYSTEROSCOPY/ POLYPECTOMY;  Surgeon: Silverio Lay, MD;  Location: [facility];  Service: Gynecology;  Laterality: N/A;   WISDOM TOOTH EXTRACTION  1992    REVIEW OF SYSTEMS:  Constitutional: negative Eyes: negative Ears, nose, mouth, throat, and face: positive for sore mouth Respiratory: positive for sputum Cardiovascular: negative Gastrointestinal: negative Genitourinary:negative Integument/breast: negative Hematologic/lymphatic: negative Musculoskeletal:negative Neurological: negative Behavioral/Psych: negative Endocrine: negative Allergic/Immunologic: negative   PHYSICAL EXAMINATION: General appearance: alert, cooperative, and no distress Head: Normocephalic, without obvious abnormality, atraumatic Neck: no adenopathy, no JVD, supple, symmetrical, trachea midline, and thyroid not enlarged, symmetric, no tenderness/mass/nodules Lymph nodes: Cervical, supraclavicular, and axillary nodes normal. Resp: clear to auscultation bilaterally Back: negative, symmetric, no curvature. ROM normal. No CVA tenderness. Cardio: regular rate and rhythm, S1, S2 normal, no murmur, click, rub or gallop GI: soft, non-tender; bowel sounds normal; no masses,  no organomegaly Extremities: extremities normal, atraumatic, no cyanosis or edema Neurologic: Alert and oriented X 3, normal strength and tone. Normal symmetric  reflexes. Normal coordination and gait  ECOG PERFORMANCE STATUS: 1 - Symptomatic but completely ambulatory  Blood pressure 117/77, pulse 70, temperature 98.4 F (36.9  C), temperature source Temporal, resp. rate 17, weight 139 lb 9.6 oz (63.3 kg), last menstrual period 12/07/2023, SpO2 100%.  LABORATORY DATA: Lab Results  Component Value Date   WBC 5.8 12/30/2023   HGB 14.0 12/30/2023   HCT 40.9 12/30/2023   MCV 89.3 12/30/2023   PLT 267 12/30/2023      Chemistry      Component Value Date/Time   NA 140 12/13/2023 0859   K 5.0 12/13/2023 0859   CL 104 12/13/2023 0859   CO2 32 12/13/2023 0859   BUN 18 12/13/2023 0859   CREATININE 0.99 12/13/2023 0859      Component Value Date/Time   CALCIUM 10.0 12/13/2023 0859   ALKPHOS 61 12/13/2023 0859   AST 26 12/13/2023 0859   ALT 18 12/13/2023 0859   BILITOT 0.3 12/13/2023 0859       RADIOGRAPHIC STUDIES:    ASSESSMENT AND PLAN: This is a very pleasant 48 years old white female with highly suspicious metastatic malignant melanoma presented with large mass in the left upper/left lower lobe and mediastinal lymphadenopathy as well as solitary brain metastasis status post left temporal craniotomy and resection of tumor on 04/29/2018 and she is recovering well from her surgery. The patient completed stereotactic radiotherapy to the resection cavity next week under the care of Dr. Mitzi Hansen. The patient underwent treatment with immunotherapy with Keytruda 200 mg IV every 3 weeks status post 3 cycles.   Her scan after cycle #3 showed enlargement of the left upper lobe lung mass.  This was also suspicious for pseudo-progression on immunotherapy.  The patient was started on a palliative course of radiotherapy to the left upper lobe lung mass and she tolerated this treatment fairly well.  She was seen by Dr. Leighton Roach at [facility] and she recommended for the patient to resume her treatment with [facility] for now until she undergoes further molecular studies.  The patient was treated with 2 more cycles of Keytruda and tolerated the treatment well. Repeat CT scan of the chest, abdomen and pelvis at that time  showed some improvement in the left upper lobe lung mass but there is still concern about pericardial invasion. She is still not a good candidate for surgical resection because of the pericardial invasion. The final pathology report and recommendation from [facility] was consistent with metastatic melanoma and the recommendation is to switch the patient to a combination immunotherapy with Ipilumumab and nivolumab. The patient underwent treatment with immunotherapy with ipilimumab and nivolumab status post 2 cycles.  Her treatment was on hold for more than 6 weeks secondary to grade 3 hepatic dysfunction.  She had improvement of her liver enzyme after a prolonged treatment with a steroid with a tapering schedule. She resumed her treatment again with single agent nivolumab.  She is status post 40 cycles of treatment. She has tolerated her treatment well with no concerning adverse effects. Starting from cycle #39 the patient was treated with nivolumab 480 mg IV every 4 weeks. The patient is currently on observation since her last dose on December 02, 2020. She had repeat PET scan performed recently.  I personally and independently reviewed the PET scan images and discussed the result with the patient today.  Her PET scan showed no concerning findings for disease recurrence or metastasis.    Metastatic Melanoma Diagnosed in May 2019. Treated  with ipilimumab and nivolumab, followed by nivolumab monotherapy, completed in December 2021. Currently no evidence of disease recurrence on recent PET scan. Patient remains active and concerned about long-term health and activity levels. - Continue observation - Follow-up in six months  Radiation-Induced Rib Fractures Recurrent rib fractures secondary to radiation therapy. Recent fracture of rib five confirmed by chest x-ray. No evidence of pneumothorax. Frequent rib fractures over the past two years with associated pain and functional limitations.  Discussed potential surgical stabilization with cardiothoracic surgeon and possible orthopedic consultation. Advised against aggressive physical activities, especially involving bending or left side movements. - Consult cardiothoracic surgeon for potential surgical stabilization - Consider referral to orthopedic surgeon at [facility] for additional management options. She will discuss with Dr. Harold Hedge. - Advise against aggressive physical activities, especially involving bending or left side movements  Radiation-Induced Atelectasis Atelectasis secondary to previous radiation therapy. Improvement noted by pulmonologist last year. No current evidence of pneumothorax or other acute complications. Patient continues regular but non-aggressive physical activity and breathing exercises. - Encourage regular but non-aggressive physical activity and breathing exercises  Mouth Sores Persistent mouth sores, exacerbated by sweet foods. Differential includes viral infection or other etiologies. Recent dental evaluation was unremarkable. Current white blood cell count is normal. - Consider evaluation for viral infections or other causes - Maintain good oral hygiene - Monitor for changes or worsening symptoms  General Health Maintenance Patient is generally active and concerned about long-term health and activity levels. No current evidence of melanoma recurrence. - Encourage continued physical activity within safe limits - Advise on maintaining a balanced diet and regular health check-ups  Follow-up - Follow-up with cardiothoracic surgeon - Follow-up with orthopedic surgeon - Next oncology follow-up in six months.    For the uterine mass, she will reach out to her gynecologist for evaluation. The patient was advised to call immediately if she has any other concerning symptoms in the interval. The patient voices understanding of current disease status and treatment options and is in agreement with the  current care plan. All questions were answered. The patient knows to call the clinic with any problems, questions or concerns. We can certainly see the patient much sooner if necessary.  Disclaimer: This note was dictated with voice recognition software. Similar sounding words can inadvertently be transcribed and may not be corrected upon review.

## 2023-12-30 NOTE — Addendum Note (Signed)
Addended by: Si Gaul on: 12/30/2023 05:45 PM   Modules accepted: Orders

## 2023-12-31 DIAGNOSIS — C439 Malignant melanoma of skin, unspecified: Secondary | ICD-10-CM | POA: Diagnosis not present

## 2023-12-31 DIAGNOSIS — M8580 Other specified disorders of bone density and structure, unspecified site: Secondary | ICD-10-CM | POA: Diagnosis not present

## 2023-12-31 DIAGNOSIS — S2242XG Multiple fractures of ribs, left side, subsequent encounter for fracture with delayed healing: Secondary | ICD-10-CM | POA: Diagnosis not present

## 2023-12-31 DIAGNOSIS — X58XXXD Exposure to other specified factors, subsequent encounter: Secondary | ICD-10-CM | POA: Diagnosis not present

## 2024-01-01 ENCOUNTER — Encounter: Payer: Self-pay | Admitting: Internal Medicine

## 2024-01-03 DIAGNOSIS — M9906 Segmental and somatic dysfunction of lower extremity: Secondary | ICD-10-CM | POA: Diagnosis not present

## 2024-01-03 DIAGNOSIS — M5386 Other specified dorsopathies, lumbar region: Secondary | ICD-10-CM | POA: Diagnosis not present

## 2024-01-03 DIAGNOSIS — M9903 Segmental and somatic dysfunction of lumbar region: Secondary | ICD-10-CM | POA: Diagnosis not present

## 2024-01-05 ENCOUNTER — Encounter: Payer: Self-pay | Admitting: Internal Medicine

## 2024-01-06 ENCOUNTER — Other Ambulatory Visit: Payer: Self-pay | Admitting: Internal Medicine

## 2024-01-06 DIAGNOSIS — C7802 Secondary malignant neoplasm of left lung: Secondary | ICD-10-CM

## 2024-01-06 DIAGNOSIS — C7931 Secondary malignant neoplasm of brain: Secondary | ICD-10-CM

## 2024-01-08 ENCOUNTER — Encounter: Payer: Self-pay | Admitting: Internal Medicine

## 2024-01-08 DIAGNOSIS — T148XXA Other injury of unspecified body region, initial encounter: Secondary | ICD-10-CM

## 2024-01-08 DIAGNOSIS — C439 Malignant melanoma of skin, unspecified: Secondary | ICD-10-CM

## 2024-01-10 NOTE — Addendum Note (Signed)
Addended by: Kern Reap B on: 01/10/2024 11:16 AM   Modules accepted: Orders

## 2024-01-11 ENCOUNTER — Other Ambulatory Visit (INDEPENDENT_AMBULATORY_CARE_PROVIDER_SITE_OTHER): Payer: BC Managed Care – PPO

## 2024-01-11 DIAGNOSIS — T148XXA Other injury of unspecified body region, initial encounter: Secondary | ICD-10-CM

## 2024-01-11 DIAGNOSIS — C439 Malignant melanoma of skin, unspecified: Secondary | ICD-10-CM | POA: Diagnosis not present

## 2024-01-11 DIAGNOSIS — S2242XA Multiple fractures of ribs, left side, initial encounter for closed fracture: Secondary | ICD-10-CM

## 2024-01-12 ENCOUNTER — Other Ambulatory Visit (HOSPITAL_COMMUNITY): Payer: BC Managed Care – PPO

## 2024-01-12 LAB — PHOSPHORUS: Phosphorus: 3.2 mg/dL (ref 2.3–4.6)

## 2024-01-19 LAB — OSTEOCALCIN, SERUM: Osteocalcin, N-MID: 12 ng/mL (ref 8–32)

## 2024-01-20 DIAGNOSIS — X58XXXD Exposure to other specified factors, subsequent encounter: Secondary | ICD-10-CM | POA: Diagnosis not present

## 2024-01-20 DIAGNOSIS — S2242XG Multiple fractures of ribs, left side, subsequent encounter for fracture with delayed healing: Secondary | ICD-10-CM | POA: Diagnosis not present

## 2024-01-20 DIAGNOSIS — M8580 Other specified disorders of bone density and structure, unspecified site: Secondary | ICD-10-CM | POA: Diagnosis not present

## 2024-01-25 DIAGNOSIS — M9906 Segmental and somatic dysfunction of lower extremity: Secondary | ICD-10-CM | POA: Diagnosis not present

## 2024-01-25 DIAGNOSIS — M9903 Segmental and somatic dysfunction of lumbar region: Secondary | ICD-10-CM | POA: Diagnosis not present

## 2024-01-25 DIAGNOSIS — M5386 Other specified dorsopathies, lumbar region: Secondary | ICD-10-CM | POA: Diagnosis not present

## 2024-02-29 DIAGNOSIS — M5386 Other specified dorsopathies, lumbar region: Secondary | ICD-10-CM | POA: Diagnosis not present

## 2024-02-29 DIAGNOSIS — M9903 Segmental and somatic dysfunction of lumbar region: Secondary | ICD-10-CM | POA: Diagnosis not present

## 2024-02-29 DIAGNOSIS — M9906 Segmental and somatic dysfunction of lower extremity: Secondary | ICD-10-CM | POA: Diagnosis not present

## 2024-03-27 DIAGNOSIS — M9906 Segmental and somatic dysfunction of lower extremity: Secondary | ICD-10-CM | POA: Diagnosis not present

## 2024-03-27 DIAGNOSIS — M5386 Other specified dorsopathies, lumbar region: Secondary | ICD-10-CM | POA: Diagnosis not present

## 2024-03-27 DIAGNOSIS — M9903 Segmental and somatic dysfunction of lumbar region: Secondary | ICD-10-CM | POA: Diagnosis not present

## 2024-04-11 NOTE — Progress Notes (Addendum)
 Endocrinology Clinic Visit Note  ASSESSMENT AND PLAN:   Leslie [name] is a 48 y.o. female with a PMHx of metastatic melanoma c/b L lung mass s/p radiation 08/2018, L temporal metastases s/p resection 04/2018, radiation 05/2018 and ipilimumab  and nivolumab  therapy 2021 who is seen today regarding low bone mineral density.  Low bone mineral density Radiation-induced rib fractures DEXA scans Date L-Spine L Femoral Neck L Total Hip  01/20/23 Z -0.6 Z -1.5 Z -1.0  01/20/24 Z 0 Z -1.9 Z -0.6   Rib fractures most likely secondary to radiation therapy. She received radiation treatment to left lung mass, which is near the left rib fractures she keeps having.  Discussed at length with patient that there is limited data for use of antiresorptive therapy for treatment of rib fractures in this context and that there would not be clear benefit. Regarding her low bone mineral density in the left femoral neck, she is at very low risk for fragility fractures in her hip or spine.  Her left total hip and lumbar spine bone densities are normal for her age.  She does not have a history of fragility fractures or concerns for falls.  She is very physically active which is protective against fragility fractures. Additionally, her FRAX risk score is 1.3% for hip fracture and 7.8% for major osteoporotic fracture, which is does not meet treatment threshold for antiresorptive therapy. PLAN No antiresorptive treatment recommended at this time Repeat DEXA in 2 years 01/2026 Vitamin D  1000 international units daily Elemental calcium intake 1000-1200 mg daily Encouraged resistance exercises as tolerated. We do not have strong indications to have her stop her physical activity. We encouraged her to do what she can as tolerated and to limit it if she develops pain.  Lab Results  Component Value Date   Vitamin D  Total (25OH) 55.3 03/25/2023    Patient Instructions  Recommend to take vitamin D  supplementation 1000  international units daily. Recommend calcium intake of 1200 mg daily, can supplement with over-the-counter pills if not eating enough calcium-rich foods. Encourage resistance exercises to slow bone loss and reduce risk of fractures. Continue your physical activity as tolerated. If you develop rib pain, would need to modify your workouts to accommodate. We do not recommend starting treatment for low bone density because we do not think you are at risk for fragility fractures. We recommend to discontinue strontium supplement.  No follow-ups on file.  Patient was seen and staffed with Dr. Melodye.  Toribio Batch, MD Endocrinology Fellow PGY-4  I saw and evaluated the patient, participating in the key portions of the service.  I reviewed the resident's note.  I agree with the resident's findings and plan.  In summary, this very fit, premenopausal woman has bone densities that are not worrisome. Her rib fractures appear to within the same left ribs (either 5-6-7 or 6-7-8 depending on who is reading XRs), and may indeed have radiation injury - which is hard to heal, and is generally not improved by osteoporosis Rx. There is no rationale for treatment in this estrogen replete woman. We also discussed her lifting regimen: we did discuss that it is possible that some of the heavier lifts she is doing (80+ pounds) might stress her ribs, but I would trust her, at this point, to manage her fitness routine (which overall will promote her bone health and general health). If she continues to injure the left rib cage, she agrees that she might need to modify some of her  routine. I am happy to discuss further if Dr Tiajuana wishes. Clarita Rao MD   SUBJECTIVE:   History of Present Illness: Leslie [name] is a 48 y.o. female who is seen in consultation today at the request of Stergios Moschos for evaluation of osteopenia.  Risk factors for osteoporosis or low bone mass:  Radiation to left lower  lung  Bone fracture history 2021 - rib fractures, no trauma, incidentally found on X-ray when monitoring pneumothorax 2022 - rib fractures, no trauma 2024 - left 6th-8th rib fractures 2025 - left 5th-7th rib fractures, indeterminate age  Bone treatment history N/A  Childhood bone disease No  Reproductive history Menarche: 12 Pregnancies: 2 Children: 2 Menopause: she has noticed that her luteal phase has become shorter in the past few months. Her periods are happening more frequently every 23 days, whereas before they have been every 28-30 days. They are still regular.  Family history of fractures or bone disease Great paternal grandmother - osteoporosis Denies history of fractures, hypercalcemia or kidney stones in the family.   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 No  Contraindications to bisphosphonates Oral contraindications Trouble with swallowing pills? No GERD? No CKD (eGFR <35)? No  Hypocalcemia? No  Contraindications to PTH analog therapy Primary or secondary hyperparathyroidism? Unknown Kidney stones? No Cancer with osseous mets? No Paget disease? No Unexplained ALP elevation? No Skeletal radiation? Yes Dizziness/balance issues/orthostatic symptoms? No issues  Contraindications to romosozumab Cardiovascular disease? No Stroke? No Barriers to monthly clinic injections? 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 Fluent in [place].  Medical History Surgical History  No past medical history on file.  No past surgical history on file.    Social History Family History  Social History   Tobacco Use  . Smoking status: Never  . Smokeless tobacco: Never  Substance Use Topics  . Alcohol use: Not Currently    No family history on file.    Medications   Current Outpatient Medications:  .  AMERICAN GINSENG ROOT ORAL, Take 3,000 mg by mouth daily as needed (fatigue)., Disp: , Rfl:  .  cholecalciferol, vitD3,/vit K2 (VITAMIN D3-VITAMIN K2 ORAL), Take 1 tablet by mouth daily. 5000 IU D3 + 50 mcg K2, Disp: , Rfl:  .  coenzyme Q10 100 mg capsule, Take 1 capsule (100 mg total) by mouth in the morning., Disp: , Rfl:  .  methylsulfonylmethane (MSM ORAL), Take 4,000 mg by mouth daily., Disp: , Rfl:  .  NON FORMULARY, Take 1 tablet by mouth daily. Quercetin (400 mg) / Bromelain (85 mg), Disp: , Rfl:  .  om 3/E/linol/ala/oleic/gla/lip (OMEGA 3-6-9 ORAL), Take 1,825 mg by mouth daily., Disp: , Rfl:  .  vitamin E-670 mg, 1000 UNIT, 268 mg (400 UNIT) capsule, Take 1 capsule (268 mg total)  by mouth daily., Disp: 60 capsule, Rfl: 2 .  pregabalin (LYRICA) 25 MG capsule, Take 1 capsule (25 mg total) by mouth at bedtime., Disp: , Rfl:      Allergies  Allergies  Allergen Reactions  . Fluconazole  Hives, Rash, Shortness Of Breath, Other (See Comments) and Swelling    Wheezing and throat pressue  Wheezing and throat pressue Wheezing and throat pressue        OBJECTIVE:   Physical Exam: BP 100/67   Pulse 68   Wt 65.7 kg (144 lb 12.8 oz)   BMI 21.38 kg/m   Wt Readings from Last 10 Encounters:  04/12/24 65.7 kg (144 lb 12.8 oz)  12/31/23 62.7 kg (138 lb 4.8 oz)  12/06/23 64.9 kg (143 lb 1.6 oz)   07/27/23 67.2 kg (148 lb 3.2 oz)  04/21/23 67 kg (147 lb 9.6 oz)  03/25/23 67.7 kg (149 lb 3.2 oz)  01/22/23 66.1 kg (145 lb 11.2 oz)  10/21/22 64.9 kg (143 lb)  08/14/22 66 kg (145 lb 8.1 oz)  04/14/22 66.7 kg (147 lb)    General: female in no apparent distress HEENT: EOMI, sclera anicteric Respiratory: No increased work of breathing on RA Neuro: Awake, alert, and oriented; no tremors Psych: Normal mood and affect Skin: No abnormal skin pigmentation  Labs: I personally reviewed labs available in Epic prior to the start of today's visit.   No results found for: A1C No results found for: TRIG, CHOL, HDL, LDL Lab Results  Component Value Date   TSH 3.033 03/25/2023   No results found for: CREATININE, GFRNAAF, ALBCRERAT No results found for: MALBCRERAT No results found for: CPEPTIDE No visits with results within 6 Month(s) from this visit.  Latest known visit with results is:  Office Visit on 03/25/2023  Component Date Value  . Phosphorus 03/25/2023 3.3   . Vitamin D  Total (25OH) 03/25/2023 55.3   . TSH 03/25/2023 3.033    No results found for: NA, K, CL, CO2, BUN, CREATININE, GLUCOSE, GFR, AST, LABALT, PROT, ALBUMIN  No results found for: FSH, LH No results found for: ESTRADIOL No results found for: TESTOSTERONE No results found for: PROLACTIN No components found for: ILGF1 No results found for: CORTISOL No components found for: ALDOSTERON No results found for: PTH No results found for: CA No results found for: ALBUMIN  Lab Results  Component Value Date   PHOS 3.3 03/25/2023   No components found for: VITD26  Imaging: I personally reviewed imaging available in Epic prior to the start of today's visit.  I reviewed and summarized (above) records in preparation for today's visit all pertinent notes in Epic/Media and CareEverywhere as well as any sent records.

## 2024-04-12 DIAGNOSIS — M859 Disorder of bone density and structure, unspecified: Secondary | ICD-10-CM | POA: Diagnosis not present

## 2024-04-12 DIAGNOSIS — S2242XG Multiple fractures of ribs, left side, subsequent encounter for fracture with delayed healing: Secondary | ICD-10-CM | POA: Diagnosis not present

## 2024-04-12 DIAGNOSIS — X58XXXA Exposure to other specified factors, initial encounter: Secondary | ICD-10-CM | POA: Diagnosis not present

## 2024-04-12 DIAGNOSIS — M8580 Other specified disorders of bone density and structure, unspecified site: Secondary | ICD-10-CM | POA: Diagnosis not present

## 2024-05-09 DIAGNOSIS — M9901 Segmental and somatic dysfunction of cervical region: Secondary | ICD-10-CM | POA: Diagnosis not present

## 2024-05-09 DIAGNOSIS — M5382 Other specified dorsopathies, cervical region: Secondary | ICD-10-CM | POA: Diagnosis not present

## 2024-05-09 DIAGNOSIS — S39012A Strain of muscle, fascia and tendon of lower back, initial encounter: Secondary | ICD-10-CM | POA: Diagnosis not present

## 2024-05-29 DIAGNOSIS — S39012A Strain of muscle, fascia and tendon of lower back, initial encounter: Secondary | ICD-10-CM | POA: Diagnosis not present

## 2024-05-29 DIAGNOSIS — M9901 Segmental and somatic dysfunction of cervical region: Secondary | ICD-10-CM | POA: Diagnosis not present

## 2024-05-29 DIAGNOSIS — M5382 Other specified dorsopathies, cervical region: Secondary | ICD-10-CM | POA: Diagnosis not present

## 2024-06-05 DIAGNOSIS — Z08 Encounter for follow-up examination after completed treatment for malignant neoplasm: Secondary | ICD-10-CM | POA: Diagnosis not present

## 2024-06-05 DIAGNOSIS — Z923 Personal history of irradiation: Secondary | ICD-10-CM | POA: Diagnosis not present

## 2024-06-05 DIAGNOSIS — C7931 Secondary malignant neoplasm of brain: Secondary | ICD-10-CM | POA: Diagnosis not present

## 2024-06-05 DIAGNOSIS — Z85118 Personal history of other malignant neoplasm of bronchus and lung: Secondary | ICD-10-CM | POA: Diagnosis not present

## 2024-06-09 ENCOUNTER — Other Ambulatory Visit: Payer: Self-pay | Admitting: Obstetrics and Gynecology

## 2024-06-09 DIAGNOSIS — Z1231 Encounter for screening mammogram for malignant neoplasm of breast: Secondary | ICD-10-CM

## 2024-06-14 DIAGNOSIS — L821 Other seborrheic keratosis: Secondary | ICD-10-CM | POA: Diagnosis not present

## 2024-06-14 DIAGNOSIS — L578 Other skin changes due to chronic exposure to nonionizing radiation: Secondary | ICD-10-CM | POA: Diagnosis not present

## 2024-06-14 DIAGNOSIS — C439 Malignant melanoma of skin, unspecified: Secondary | ICD-10-CM | POA: Diagnosis not present

## 2024-06-14 DIAGNOSIS — D225 Melanocytic nevi of trunk: Secondary | ICD-10-CM | POA: Diagnosis not present

## 2024-06-15 ENCOUNTER — Encounter: Payer: Self-pay | Admitting: Internal Medicine

## 2024-06-19 ENCOUNTER — Inpatient Hospital Stay: Payer: BC Managed Care – PPO

## 2024-06-19 ENCOUNTER — Other Ambulatory Visit: Payer: Self-pay | Admitting: Medical Oncology

## 2024-06-19 ENCOUNTER — Ambulatory Visit (HOSPITAL_COMMUNITY): Admission: RE | Admit: 2024-06-19 | Source: Ambulatory Visit

## 2024-06-20 ENCOUNTER — Other Ambulatory Visit: Payer: Self-pay | Admitting: Medical Oncology

## 2024-06-23 ENCOUNTER — Ambulatory Visit (HOSPITAL_COMMUNITY)
Admission: RE | Admit: 2024-06-23 | Discharge: 2024-06-23 | Disposition: A | Discharge status: Home / Self Care | Source: Ambulatory Visit | Attending: Internal Medicine | Admitting: Internal Medicine

## 2024-06-23 ENCOUNTER — Inpatient Hospital Stay: Attending: Internal Medicine

## 2024-06-23 DIAGNOSIS — Z85820 Personal history of malignant melanoma of skin: Secondary | ICD-10-CM | POA: Diagnosis not present

## 2024-06-23 DIAGNOSIS — C3432 Malignant neoplasm of lower lobe, left bronchus or lung: Secondary | ICD-10-CM

## 2024-06-23 DIAGNOSIS — C7931 Secondary malignant neoplasm of brain: Secondary | ICD-10-CM | POA: Diagnosis not present

## 2024-06-23 DIAGNOSIS — Z923 Personal history of irradiation: Secondary | ICD-10-CM | POA: Diagnosis not present

## 2024-06-23 LAB — CBC WITH DIFFERENTIAL (CANCER CENTER ONLY)
Abs Immature Granulocytes: 0 K/uL (ref 0.00–0.07)
Basophils Absolute: 0 K/uL (ref 0.0–0.1)
Basophils Relative: 1 %
Eosinophils Absolute: 0.1 K/uL (ref 0.0–0.5)
Eosinophils Relative: 2 %
HCT: 40.9 % (ref 36.0–46.0)
Hemoglobin: 13.6 g/dL (ref 12.0–15.0)
Immature Granulocytes: 0 %
Lymphocytes Relative: 23 %
Lymphs Abs: 0.7 K/uL (ref 0.7–4.0)
MCH: 30.4 pg (ref 26.0–34.0)
MCHC: 33.3 g/dL (ref 30.0–36.0)
MCV: 91.3 fL (ref 80.0–100.0)
Monocytes Absolute: 0.4 K/uL (ref 0.1–1.0)
Monocytes Relative: 12 %
Neutro Abs: 2 K/uL (ref 1.7–7.7)
Neutrophils Relative %: 62 %
Platelet Count: 265 K/uL (ref 150–400)
RBC: 4.48 MIL/uL (ref 3.87–5.11)
RDW: 12.7 % (ref 11.5–15.5)
WBC Count: 3.2 K/uL — ABNORMAL LOW (ref 4.0–10.5)
nRBC: 0 % (ref 0.0–0.2)

## 2024-06-23 LAB — CMP (CANCER CENTER ONLY)
ALT: 20 U/L (ref 0–44)
AST: 22 U/L (ref 15–41)
Albumin: 4.5 g/dL (ref 3.5–5.0)
Alkaline Phosphatase: 64 U/L (ref 38–126)
Anion gap: 4 — ABNORMAL LOW (ref 5–15)
BUN: 16 mg/dL (ref 6–20)
CO2: 30 mmol/L (ref 22–32)
Calcium: 9.7 mg/dL (ref 8.9–10.3)
Chloride: 107 mmol/L (ref 98–111)
Creatinine: 0.94 mg/dL (ref 0.44–1.00)
GFR, Estimated: >60 mL/min (ref >60)
Glucose, Bld: 84 mg/dL (ref 70–99)
Potassium: 5 mmol/L (ref 3.5–5.1)
Sodium: 141 mmol/L (ref 135–145)
Total Bilirubin: 0.6 mg/dL (ref 0.0–1.2)
Total Protein: 7.2 g/dL (ref 6.5–8.1)

## 2024-06-23 LAB — GLUCOSE, CAPILLARY: Glucose-Capillary: 77 mg/dL (ref 70–99)

## 2024-06-23 LAB — LACTATE DEHYDROGENASE: LDH: 175 U/L (ref 98–192)

## 2024-06-23 MED ORDER — FLUDEOXYGLUCOSE F - 18 (FDG) INJECTION
6.8000 | Freq: Once | INTRAVENOUS | Status: AC | PRN
  Administered 2024-06-23: 6.8 via INTRAVENOUS

## 2024-06-26 ENCOUNTER — Inpatient Hospital Stay (HOSPITAL_BASED_OUTPATIENT_CLINIC_OR_DEPARTMENT_OTHER): Payer: BC Managed Care – PPO | Admitting: Internal Medicine

## 2024-06-26 VITALS — BP 124/85 | HR 65 | Temp 97.6°F | Resp 16 | Ht 68.5 in | Wt 147.0 lb

## 2024-06-26 DIAGNOSIS — C7802 Secondary malignant neoplasm of left lung: Secondary | ICD-10-CM

## 2024-06-26 DIAGNOSIS — Z923 Personal history of irradiation: Secondary | ICD-10-CM | POA: Diagnosis not present

## 2024-06-26 DIAGNOSIS — Z85820 Personal history of malignant melanoma of skin: Secondary | ICD-10-CM | POA: Diagnosis not present

## 2024-06-26 DIAGNOSIS — C7931 Secondary malignant neoplasm of brain: Secondary | ICD-10-CM | POA: Diagnosis not present

## 2024-06-26 NOTE — Progress Notes (Signed)
 [facility] [phone]   [phone]  OFFICE PROGRESS NOTE  [name], [name], MD [address] [place] [address]  DIAGNOSIS: Metastatic high-grade neoplasm consistent with metastatic malignant melanoma based on the most recent pathology report from [facility] in October 2019, presented with large left left upper/left lower lobe mass with a hypermetabolic AP window lymph node as well as solitary metastatic brain lesion diagnosed in May 2019.  Biomarker Findings Microsatellite status - MS-Stable Tumor Mutational Burden - TMB-Intermediate (16 Muts/Mb) Genomic Findings For a complete list of the genes assayed, please refer to the Appendix. CD274 (PD-L1) amplification NRAS Q61R PDCD1LG2 (PD-L2) amplification MYC amplification EPHB1 amplification JAK2 amplification RB1 Q93*, E672* TERT promoter -146C>T TP53 C275W   PRIOR THERAPY: 1) left temporal craniotomy with resection of tumor with intraoperative stereotactic guidance for volumetric resection under the care of Dr. Louis on 04/29/2018. 2) First-line treatment with immunotherapy with Keytruda  200 mg IV every 3 weeks.  First dose June 02, 2018.  Status post 3 cycles.  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 3) palliative radiotherapy to the left lung mass under the care of Dr. Dewey completed on 08/24/2018. 4) Resuming her treatment again with Keytruda  200 mg IV every 3 weeks, first dose 09/08/2018.  Status post 2 cycles. 5) Treatment with immunotherapy with ipilimumab  3 mg/KG and nivolumab  1 mg/KG every 3 weeks for the first 4 cycles followed by maintenance nivolumab  240 mg IV every 2 weeks.  First dose of this treatment October 26, 2018.  Status post 40 cycles.  Ipilimumab  was discontinued after cycle #2 for the significant liver dysfunction.  Starting from cycle #3 the patient is on treatment with single agent  nivolumab . Starting from cycle #39 the patient is switching to nivolumab  480 mg IV every 4 weeks.  Her treatment is currently on hold since December 02, 2020.  CURRENT THERAPY: Observation.  INTERVAL HISTORY: Leslie [name] 48 y.o. female returns to the clinic today for 59-month follow-up visit.Discussed the use of AI scribe software for clinical note transcription with the patient, who gave verbal consent to proceed.  History of Present Illness   Leslie [name] is a 48 year old female with metastatic melanoma who presents for evaluation and repeat PET scan for restaging of her disease.  Diagnosed with metastatic melanoma in October 2019, she underwent a left temporal craniotomy with resection of a brain tumor. She was treated with immunotherapy, receiving ipilimumab  and nivolumab  every six months for four cycles, followed by maintenance treatment with nivolumab , which was completed in December 2021. She has been on observation since then.  She has no new complaints since her last visit six months ago. She has resumed weightlifting and biking, focusing on cardiovascular fitness, and has gained seven pounds, attributed to muscle gain. She continues to perform breathing exercises twice a day.  She has been off treatment for almost four years. She inquired about the status of her ribs, which were previously noted to have a malunion.       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 Patient follows with Dr. Sherrod Sherrod at [facility], LOV as of 05/06/22 was on 01/12/22 in Epic.    Migraines    hx of migraines when pt was younger   Pneumothorax 12/2020   Patient had a chest tube/drain for about 4 days per pt.   Yeast infection     ALLERGIES:  is allergic to diflucan  [fluconazole ].  MEDICATIONS:  Current Outpatient Medications  Medication Sig Dispense Refill   AMERICAN GINSENG PO Take 1,500-3,000 mg by mouth daily. Depending on level of fatigue     CALCIUM-MAGNESIUM-VITAMIN D  ER PO      Coenzyme Q10 (COQ-10) 100 MG CAPS Take 1 tablet by mouth daily.     Methylsulfonylmethane (MSM) 1000 MG CAPS Take 4 capsules by mouth daily. 4000 mg (1/2 before workout and 1/2 afterwards)     NON FORMULARY Take 1 Dose by mouth as needed. Cordyceps 750 mg     NON FORMULARY Take 1 Dose by mouth daily. Curcumin Phytosome (Bioperrine) 3000 mg     SM Omega-3-6-9 Fatty Acids CAPS Take by mouth.     No current facility-administered medications for this visit.    SURGICAL HISTORY:  Past Surgical History:  Procedure Laterality Date   APPLICATION OF CRANIAL NAVIGATION Left 04/29/2018   Procedure: APPLICATION OF CRANIAL NAVIGATION;  Surgeon: Louis Shove, MD;  Location: [facility];  Service: Neurosurgery;  Laterality: Left;   CRANIOTOMY Left 04/29/2018   Procedure: LEFT CRANIOTOMY FOR  TUMOR BRAIN LAB;  Surgeon: Louis Shove, MD;  Location: [facility] OR;  Service: Neurosurgery;  Laterality: Left;   HYSTEROSCOPY WITH D & C N/A 05/07/2022   Procedure: DILATATION AND CURETTAGE /HYSTEROSCOPY/ POLYPECTOMY;  Surgeon: Darcel Pool, MD;  Location: [facility];  Service: Gynecology;  Laterality: N/A;   WISDOM TOOTH EXTRACTION  1992    REVIEW OF SYSTEMS:  A comprehensive review of systems was negative.   PHYSICAL EXAMINATION: General appearance: alert, cooperative, and no distress Head: Normocephalic, without obvious abnormality, atraumatic Neck: no adenopathy, no JVD, supple, symmetrical, trachea midline, and thyroid  not enlarged, symmetric, no tenderness/mass/nodules Lymph nodes:  Cervical, supraclavicular, and axillary nodes normal. Resp: clear to auscultation bilaterally Back: negative, symmetric, no curvature. ROM normal. No CVA tenderness. Cardio: regular rate and rhythm, S1, S2 normal, no murmur, click, rub or gallop GI: soft, non-tender; bowel sounds normal; no masses,  no organomegaly Extremities: extremities normal, atraumatic, no cyanosis or edema  ECOG PERFORMANCE STATUS: 1 - Symptomatic but completely ambulatory  Blood pressure 124/85, pulse 65, temperature 97.6 F (36.4 C), temperature source Temporal, resp. rate 16, height 5' 8.5 (1.74 m), weight 147 lb (66.7 kg), last menstrual period 06/09/2024, SpO2 100%.  LABORATORY DATA: Lab Results  Component Value Date   WBC 3.2 (L) 06/23/2024   HGB 13.6 06/23/2024   HCT 40.9 06/23/2024   MCV 91.3 06/23/2024   PLT 265 06/23/2024      Chemistry      Component Value Date/Time   NA 141 06/23/2024 0820   K 5.0 06/23/2024 0820   CL 107 06/23/2024 0820   CO2 30 06/23/2024 0820   BUN 16 06/23/2024 0820   CREATININE 0.94 06/23/2024 0820      Component Value Date/Time   CALCIUM 9.7 06/23/2024 0820   ALKPHOS 64 06/23/2024 0820   AST 22 06/23/2024 0820   ALT 20 06/23/2024 0820   BILITOT 0.6 06/23/2024 0820       RADIOGRAPHIC STUDIES:  ASSESSMENT AND PLAN: This is a very pleasant 48 years old white female with highly suspicious metastatic malignant melanoma presented with large mass in the left upper/left lower lobe and mediastinal lymphadenopathy as well as solitary brain metastasis status post left temporal craniotomy and resection of tumor on 04/29/2018 and she is recovering well from her surgery. The patient completed stereotactic radiotherapy to the resection cavity next week under the care of Dr. Dewey. The patient underwent treatment with immunotherapy with Keytruda  200 mg IV every 3 weeks status post 3 cycles.   Her scan after cycle #3 showed enlargement of the left upper lobe lung mass.  This  was also suspicious for pseudo-progression on immunotherapy.  The patient was started on a palliative course of radiotherapy to the left upper lobe lung mass and she tolerated this treatment fairly well.  She was seen by Dr. Arnold at [facility] and she recommended for the patient to resume her treatment with Keytruda  for now until she undergoes further molecular studies.  The patient was treated with 2 more cycles of Keytruda  and tolerated the treatment well. Repeat CT scan of the chest, abdomen and pelvis at that time showed some improvement in the left upper lobe lung mass but there is still concern about pericardial invasion. She is still not a good candidate for surgical resection because of the pericardial invasion. The final pathology report and recommendation from [facility] was consistent with metastatic melanoma and the recommendation is to switch the patient to a combination immunotherapy with Ipilumumab and nivolumab . The patient underwent treatment with immunotherapy with ipilimumab  and nivolumab  status post 2 cycles.  Her treatment was on hold for more than 6 weeks secondary to grade 3 hepatic dysfunction.  She had improvement of her liver enzyme after a prolonged treatment with a steroid with a tapering schedule. She resumed her treatment again with single agent nivolumab .  She is status post 40 cycles of treatment. She has tolerated her treatment well with no concerning adverse effects. Starting from cycle #39 the patient was treated with nivolumab  480 mg IV every 4 weeks. The patient is currently on observation since her last dose on December 02, 2020. She had repeat PET scan performed recently.  I personally and independently reviewed the scan and discussed the result with the patient today.  Her scan showed no concerning findings for disease recurrence or metastasis. Assessment and Plan    Metastatic melanoma Metastatic melanoma diagnosed in October 2019.  Status post left temporal craniotomy with resection of brain tumor. Treated with immunotherapy (ipilimumab  and nivolumab ) every six months for three weeks over four cycles, followed by maintenance nivolumab , completed in December 2021. Currently on observation. Recent PET scan shows no new metastatic lesions. Disease is stable with no new complaints or symptoms. 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 - Continue breathing exercises twice daily   The patient was advised to call immediately if she has any concerning symptoms in the interval.  The patient voices understanding of current disease status and treatment options and is in agreement with the current care plan. All questions were answered. The patient knows to call the clinic with any problems, questions or concerns. We can certainly see the patient much sooner if necessary.  Disclaimer: This note was dictated with voice recognition software. Similar  sounding words can inadvertently be transcribed and may not be corrected upon review.

## 2024-06-28 ENCOUNTER — Telehealth: Payer: Self-pay | Admitting: Internal Medicine

## 2024-06-28 NOTE — Telephone Encounter (Signed)
 Left the patient a voicemail with the scheduled appointment details around the CT scan expected date.

## 2024-07-10 DIAGNOSIS — S76111A Strain of right quadriceps muscle, fascia and tendon, initial encounter: Secondary | ICD-10-CM | POA: Diagnosis not present

## 2024-07-10 DIAGNOSIS — M9903 Segmental and somatic dysfunction of lumbar region: Secondary | ICD-10-CM | POA: Diagnosis not present

## 2024-07-10 DIAGNOSIS — M5386 Other specified dorsopathies, lumbar region: Secondary | ICD-10-CM | POA: Diagnosis not present

## 2024-07-28 ENCOUNTER — Encounter: Payer: Self-pay | Admitting: Internal Medicine

## 2024-07-31 ENCOUNTER — Ambulatory Visit
Admission: RE | Admit: 2024-07-31 | Discharge: 2024-07-31 | Disposition: A | Discharge status: Home / Self Care | Source: Ambulatory Visit | Attending: Obstetrics and Gynecology | Admitting: Obstetrics and Gynecology

## 2024-07-31 DIAGNOSIS — Z1231 Encounter for screening mammogram for malignant neoplasm of breast: Secondary | ICD-10-CM | POA: Diagnosis not present

## 2024-08-07 DIAGNOSIS — M9903 Segmental and somatic dysfunction of lumbar region: Secondary | ICD-10-CM | POA: Diagnosis not present

## 2024-08-07 DIAGNOSIS — M5386 Other specified dorsopathies, lumbar region: Secondary | ICD-10-CM | POA: Diagnosis not present

## 2024-08-07 DIAGNOSIS — S76111A Strain of right quadriceps muscle, fascia and tendon, initial encounter: Secondary | ICD-10-CM | POA: Diagnosis not present

## 2024-08-08 DIAGNOSIS — Z1330 Encounter for screening examination for mental health and behavioral disorders, unspecified: Secondary | ICD-10-CM | POA: Diagnosis not present

## 2024-08-08 DIAGNOSIS — Z124 Encounter for screening for malignant neoplasm of cervix: Secondary | ICD-10-CM | POA: Diagnosis not present

## 2024-08-08 DIAGNOSIS — Z01419 Encounter for gynecological examination (general) (routine) without abnormal findings: Secondary | ICD-10-CM | POA: Diagnosis not present

## 2024-08-15 ENCOUNTER — Encounter: Payer: Self-pay | Admitting: Internal Medicine

## 2024-08-20 ENCOUNTER — Encounter: Payer: Self-pay | Admitting: Internal Medicine

## 2024-08-20 DIAGNOSIS — Z23 Encounter for immunization: Secondary | ICD-10-CM

## 2024-08-22 MED ORDER — COVID-19 MRNA VACC (MODERNA) 50 MCG/0.5ML IM SUSP: 0.5000 mL | Freq: Once | INTRAMUSCULAR | 0 refills | Status: AC

## 2024-08-24 ENCOUNTER — Encounter: Payer: Self-pay | Admitting: Internal Medicine

## 2024-08-24 MED ORDER — COVID-19 MRNA VAC-TRIS(PFIZER) 30 MCG/0.3ML IM SUSY: 0.3000 mL | PREFILLED_SYRINGE | Freq: Once | INTRAMUSCULAR | 0 refills | Status: DC

## 2024-08-24 MED ORDER — COVID-19 MRNA VAC-TRIS(PFIZER) 30 MCG/0.3ML IM SUSY: 0.3000 mL | PREFILLED_SYRINGE | Freq: Once | INTRAMUSCULAR | 0 refills | Status: AC

## 2024-08-24 NOTE — Addendum Note (Signed)
 Addended by: KATHRYNE MILLMAN B on: 08/24/2024 04:18 PM   Modules accepted: Orders

## 2024-09-18 DIAGNOSIS — M5386 Other specified dorsopathies, lumbar region: Secondary | ICD-10-CM | POA: Diagnosis not present

## 2024-09-18 DIAGNOSIS — S76111A Strain of right quadriceps muscle, fascia and tendon, initial encounter: Secondary | ICD-10-CM | POA: Diagnosis not present

## 2024-09-18 DIAGNOSIS — M9903 Segmental and somatic dysfunction of lumbar region: Secondary | ICD-10-CM | POA: Diagnosis not present

## 2024-09-26 ENCOUNTER — Encounter: Payer: Self-pay | Admitting: Internal Medicine

## 2024-09-27 ENCOUNTER — Ambulatory Visit (INDEPENDENT_AMBULATORY_CARE_PROVIDER_SITE_OTHER): Admitting: Internal Medicine

## 2024-09-27 ENCOUNTER — Encounter: Payer: Self-pay | Admitting: Internal Medicine

## 2024-09-27 VITALS — BP 106/70 | HR 63 | Temp 98.2°F | Ht 69.0 in | Wt 143.7 lb

## 2024-09-27 DIAGNOSIS — R5383 Other fatigue: Secondary | ICD-10-CM | POA: Diagnosis not present

## 2024-09-27 NOTE — Progress Notes (Signed)
 Established Patient Office Visit     CC/Reason for Visit: Fatigue  HPI: Benedicta Sultan is a 48 y.o. female who is coming in today for the above mentioned reasons.  Has been dealing with intense perimenopausal symptoms including aphthous ulcers of her mouth, sleeping issues, inconsistent menstrual bleeding and fatigue.  She is requesting iron levels checked.  She is following with her GYN for these issues.   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 Patient follows with Dr. Sherrod Sherrod at [facility], LOV as of 05/06/22 was on 01/12/22 in Epic.   Migraines    hx of migraines when pt was younger   Pneumothorax 12/2020   Patient had a chest tube/drain for about 4 days per pt.   Yeast infection     Past Surgical History:  Procedure Laterality Date   APPLICATION OF CRANIAL NAVIGATION Left 04/29/2018   Procedure: APPLICATION OF CRANIAL NAVIGATION;  Surgeon: Louis Shove, MD;  Location: [facility];  Service: Neurosurgery;  Laterality: Left;   CRANIOTOMY Left 04/29/2018   Procedure: LEFT CRANIOTOMY FOR  TUMOR BRAIN LAB;  Surgeon: Louis Shove, MD;  Location: [facility];  Service: Neurosurgery;  Laterality: Left;   HYSTEROSCOPY WITH D & C N/A 05/07/2022   Procedure: DILATATION AND CURETTAGE /HYSTEROSCOPY/ POLYPECTOMY;  Surgeon: Darcel Pool, MD;  Location: [facility];  Service: Gynecology;  Laterality: N/A;   WISDOM TOOTH EXTRACTION  1992    Social History:  reports that she has never smoked. She has never used smokeless tobacco. She reports current alcohol  use. She reports that she does not use drugs.  Allergies: Allergies  Allergen Reactions   Diflucan  [Fluconazole ] Other (See Comments)    Wheezing and throat pressue    Family History:  Family History  Problem Relation Age of Onset   Lung cancer Father 63   Stomach cancer Neg Hx    Rectal cancer Neg Hx    Esophageal cancer Neg Hx    Colon polyps Neg Hx    Colon cancer Neg Hx      Current Outpatient Medications:    AMERICAN GINSENG PO, Take 1,500-3,000 mg by mouth daily. Depending on level of fatigue, Disp: , Rfl:    CALCIUM-MAGNESIUM-VITAMIN D  ER PO, , Disp: , Rfl:    Coenzyme Q10 (COQ-10) 100 MG CAPS, Take 1 tablet by mouth daily., Disp: , Rfl:    Methylsulfonylmethane (MSM) 1000 MG CAPS, Take 4 capsules by mouth daily. 4000 mg (1/2 before workout and 1/2 afterwards), Disp: , Rfl:    NON FORMULARY, Take 1 Dose by mouth as needed. Cordyceps 750 mg, Disp: , Rfl:    NON FORMULARY, Take 1 Dose by mouth daily. Curcumin Phytosome (Bioperrine) 3000 mg, Disp: , Rfl:    SM Omega-3-6-9 Fatty Acids CAPS, Take by mouth., Disp: , Rfl:   Review of Systems:  Negative unless indicated in HPI.   Physical Exam: Vitals:   09/27/24 1546  BP: 106/70  Pulse: 63  Temp: 98.2 F (36.8 C)  TempSrc: Oral  SpO2: 94%  Weight: 143  lb 11.2 oz (65.2 kg)  Height: 5' 9 (1.753 m)    Body mass index is 21.22 kg/m.    Impression and Plan:  Fatigue, unspecified type -     CBC with Differential/Platelet; Future -     Vitamin B12; Future -     VITAMIN D  25 Hydroxy (Vit-D Deficiency, Fractures); Future -     TSH; Future -     IBC + Ferritin; Future   - Check above labs to rule out other causes of fatigue.  Time spent:23 minutes reviewing chart, interviewing and examining patient and formulating plan of care.     Tully [name], MD [facility]

## 2024-09-28 ENCOUNTER — Encounter: Payer: Self-pay | Admitting: Internal Medicine

## 2024-09-28 LAB — IBC + FERRITIN
Ferritin: 15.5 ng/mL (ref 10.0–291.0)
Iron: 58 ug/dL (ref 42–145)
Saturation Ratios: 13.2 % — ABNORMAL LOW (ref 20.0–50.0)
TIBC: 439.6 ug/dL (ref 250.0–450.0)
Transferrin: 314 mg/dL (ref 212.0–360.0)

## 2024-09-28 LAB — CBC WITH DIFFERENTIAL/PLATELET
Basophils Absolute: 0.1 K/uL (ref 0.0–0.1)
Basophils Relative: 1.3 % (ref 0.0–3.0)
Eosinophils Absolute: 0 K/uL (ref 0.0–0.7)
Eosinophils Relative: 1.1 % (ref 0.0–5.0)
HCT: 41.9 % (ref 36.0–46.0)
Hemoglobin: 13.8 g/dL (ref 12.0–15.0)
Lymphocytes Relative: 26.3 % (ref 12.0–46.0)
Lymphs Abs: 1.2 K/uL (ref 0.7–4.0)
MCHC: 32.9 g/dL (ref 30.0–36.0)
MCV: 90.9 fl (ref 78.0–100.0)
Monocytes Absolute: 0.6 K/uL (ref 0.1–1.0)
Monocytes Relative: 12.8 % — ABNORMAL HIGH (ref 3.0–12.0)
Neutro Abs: 2.6 K/uL (ref 1.4–7.7)
Neutrophils Relative %: 58.5 % (ref 43.0–77.0)
Platelets: 253 K/uL (ref 150.0–400.0)
RBC: 4.61 Mil/uL (ref 3.87–5.11)
RDW: 14.3 % (ref 11.5–15.5)
WBC: 4.4 K/uL (ref 4.0–10.5)

## 2024-09-28 LAB — VITAMIN B12: Vitamin B-12: 1342 pg/mL — ABNORMAL HIGH (ref 211–911)

## 2024-09-28 LAB — VITAMIN D 25 HYDROXY (VIT D DEFICIENCY, FRACTURES): VITD: 34.63 ng/mL (ref 30.00–100.00)

## 2024-09-28 LAB — TSH: TSH: 1.83 u[IU]/mL (ref 0.35–5.50)

## 2024-09-29 ENCOUNTER — Encounter: Payer: Self-pay | Admitting: Internal Medicine

## 2024-09-29 ENCOUNTER — Other Ambulatory Visit: Payer: Self-pay

## 2024-09-29 DIAGNOSIS — C7802 Secondary malignant neoplasm of left lung: Secondary | ICD-10-CM

## 2024-10-02 ENCOUNTER — Ambulatory Visit: Payer: Self-pay | Admitting: Internal Medicine

## 2024-10-03 ENCOUNTER — Other Ambulatory Visit: Payer: Self-pay | Admitting: Physician Assistant

## 2024-10-03 MED ORDER — INTEGRA PLUS PO CAPS: 1.0000 | ORAL_CAPSULE | Freq: Every morning | ORAL | 2 refills | Status: AC

## 2024-10-12 ENCOUNTER — Encounter: Payer: Self-pay | Admitting: Internal Medicine

## 2024-10-16 DIAGNOSIS — M5386 Other specified dorsopathies, lumbar region: Secondary | ICD-10-CM | POA: Diagnosis not present

## 2024-10-16 DIAGNOSIS — M9903 Segmental and somatic dysfunction of lumbar region: Secondary | ICD-10-CM | POA: Diagnosis not present

## 2024-10-16 DIAGNOSIS — S76111A Strain of right quadriceps muscle, fascia and tendon, initial encounter: Secondary | ICD-10-CM | POA: Diagnosis not present

## 2024-10-17 ENCOUNTER — Other Ambulatory Visit: Payer: Self-pay | Admitting: Medical Genetics

## 2024-10-17 DIAGNOSIS — Z006 Encounter for examination for normal comparison and control in clinical research program: Secondary | ICD-10-CM

## 2024-11-17 DIAGNOSIS — D2230 Melanocytic nevi of unspecified part of face: Secondary | ICD-10-CM | POA: Diagnosis not present

## 2024-11-17 DIAGNOSIS — D485 Neoplasm of uncertain behavior of skin: Secondary | ICD-10-CM | POA: Diagnosis not present

## 2024-11-17 DIAGNOSIS — D225 Melanocytic nevi of trunk: Secondary | ICD-10-CM | POA: Diagnosis not present

## 2024-11-17 DIAGNOSIS — C439 Malignant melanoma of skin, unspecified: Secondary | ICD-10-CM | POA: Diagnosis not present

## 2024-11-17 DIAGNOSIS — L821 Other seborrheic keratosis: Secondary | ICD-10-CM | POA: Diagnosis not present

## 2024-11-21 LAB — GENECONNECT MOLECULAR SCREEN: Genetic Analysis Overall Interpretation: NEGATIVE

## 2024-11-22 ENCOUNTER — Ambulatory Visit: Admitting: Internal Medicine

## 2024-11-22 VITALS — BP 102/68 | HR 64 | Temp 97.6°F | Ht 68.0 in | Wt 143.7 lb

## 2024-11-22 DIAGNOSIS — Z0000 Encounter for general adult medical examination without abnormal findings: Secondary | ICD-10-CM | POA: Diagnosis not present

## 2024-11-22 LAB — COMPREHENSIVE METABOLIC PANEL WITH GFR
ALT: 25 U/L (ref 0–35)
AST: 34 U/L (ref 0–37)
Albumin: 4.8 g/dL (ref 3.5–5.2)
Alkaline Phosphatase: 60 U/L (ref 39–117)
BUN: 16 mg/dL (ref 6–23)
CO2: 28 meq/L (ref 19–32)
Calcium: 9.9 mg/dL (ref 8.4–10.5)
Chloride: 104 meq/L (ref 96–112)
Creatinine, Ser: 0.81 mg/dL (ref 0.40–1.20)
GFR: 85.44 mL/min (ref >60.00)
Glucose, Bld: 71 mg/dL (ref 70–99)
Potassium: 4.6 meq/L (ref 3.5–5.1)
Sodium: 138 meq/L (ref 135–145)
Total Bilirubin: 0.6 mg/dL (ref 0.2–1.2)
Total Protein: 7.3 g/dL (ref 6.0–8.3)

## 2024-11-22 LAB — CBC WITH DIFFERENTIAL/PLATELET
Basophils Absolute: 0 K/uL (ref 0.0–0.1)
Basophils Relative: 0.8 % (ref 0.0–3.0)
Eosinophils Absolute: 0 K/uL (ref 0.0–0.7)
Eosinophils Relative: 0.5 % (ref 0.0–5.0)
HCT: 41.6 % (ref 36.0–46.0)
Hemoglobin: 14.2 g/dL (ref 12.0–15.0)
Lymphocytes Relative: 24.9 % (ref 12.0–46.0)
Lymphs Abs: 1.3 K/uL (ref 0.7–4.0)
MCHC: 34 g/dL (ref 30.0–36.0)
MCV: 90.9 fl (ref 78.0–100.0)
Monocytes Absolute: 0.6 K/uL (ref 0.1–1.0)
Monocytes Relative: 11.4 % (ref 3.0–12.0)
Neutro Abs: 3.1 K/uL (ref 1.4–7.7)
Neutrophils Relative %: 62.4 % (ref 43.0–77.0)
Platelets: 221 K/uL (ref 150.0–400.0)
RBC: 4.58 Mil/uL (ref 3.87–5.11)
RDW: 14.3 % (ref 11.5–15.5)
WBC: 5 K/uL (ref 4.0–10.5)

## 2024-11-22 LAB — LIPID PANEL
Cholesterol: 156 mg/dL (ref 0–200)
HDL: 74.8 mg/dL (ref >39.00)
LDL Cholesterol: 72 mg/dL (ref 0–99)
NonHDL: 81.66
Total CHOL/HDL Ratio: 2
Triglycerides: 50 mg/dL (ref 0.0–149.0)
VLDL: 10 mg/dL (ref 0.0–40.0)

## 2024-11-22 NOTE — Progress Notes (Signed)
 Established Patient Office Visit     CC/Reason for Visit: Annual preventive exam  HPI: Leslie [name] is a 48 y.o. female who is coming in today for the above mentioned reasons. Past Medical History is significant for: Metastatic melanoma to the brain that has been without recent recurrence.  Is followed closely by oncology.  Immunizations are up-to-date, cancer screening is up-to-date.  No acute concerns or complaints.   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 Patient follows with [facility] Sherrod [facility]rod at [facility], LOV as of 05/06/22 was on 01/12/22 in Epic.   Migraines    hx of migraines when pt was younger   Pneumothorax 12/2020   Patient had a chest tube/drain for about 4 days per pt.   Yeast infection     Past Surgical History:  Procedure Laterality Date   APPLICATION OF CRANIAL NAVIGATION Left 04/29/2018   Procedure: APPLICATION OF CRANIAL NAVIGATION;  Surgeon: Louis Shove, MD;  Location: [facility];  Service: Neurosurgery;  Laterality: Left;   CRANIOTOMY Left 04/29/2018   Procedure: LEFT CRANIOTOMY FOR  TUMOR BRAIN LAB;  Surgeon: Louis Shove, MD;  Location: [facility];  Service: Neurosurgery;  Laterality: Left;   HYSTEROSCOPY WITH D & C N/A 05/07/2022   Procedure: DILATATION AND CURETTAGE /HYSTEROSCOPY/ POLYPECTOMY;  Surgeon: Darcel Pool, MD;  Location: [facility];  Service: Gynecology;  Laterality: N/A;   WISDOM TOOTH EXTRACTION  1992    Social History:  reports that she has never smoked. She has never used smokeless tobacco. She reports  current alcohol use. She reports that she does not use drugs.  Allergies: Allergies  Allergen Reactions   Diflucan  [Fluconazole ] Other (See Comments)    Wheezing and throat pressue    Family History:  Family History  Problem Relation Age of Onset   Lung cancer Father 22   Stomach cancer Neg Hx    Rectal cancer Neg Hx    Esophageal cancer Neg Hx    Colon polyps Neg Hx    Colon cancer Neg Hx      Current Outpatient Medications:    AMERICAN GINSENG PO, Take 1,500-3,000 mg by mouth daily. Depending on level of fatigue, Disp: , Rfl:    CALCIUM-MAGNESIUM-VITAMIN D  ER PO, , Disp: , Rfl:    Coenzyme Q10 (COQ-10) 100 MG CAPS, Take 1 tablet by mouth daily., Disp: , Rfl:    FeFum-FePoly-FA-B Cmp-C-Biot (INTEGRA PLUS ) CAPS, Take 1 capsule by mouth every morning., Disp: 30 capsule, Rfl: 2   Methylsulfonylmethane (MSM) 1000 MG CAPS, Take 4 capsules by mouth daily. 4000 mg (1/2 before workout and 1/2 afterwards), Disp: , Rfl:    NON FORMULARY, Take 1 Dose by mouth as needed. Cordyceps 750 mg, Disp: , Rfl:    NON FORMULARY, Take 1 Dose by mouth daily. Curcumin Phytosome (Bioperrine) 3000 mg, Disp: , Rfl:    SM Omega-3-6-9 Fatty Acids CAPS, Take by mouth., Disp: , Rfl:   Review of Systems:  Negative unless indicated in HPI.   Physical Exam: Vitals:   11/22/24 1254  BP: 102/68  Pulse: 64  Temp: 97.6 F (36.4 C)  TempSrc: Oral  SpO2: 99%  Weight: 143 lb 11.2 oz (65.2 kg)  Height: 5' 8 (1.727 m)    Body mass index is 21.85 kg/m.   Physical Exam Vitals reviewed.  Constitutional:      General: She is not in acute distress.    Appearance: Normal appearance. She is not ill-appearing, toxic-appearing or diaphoretic.  HENT:     Head: Normocephalic.     Right Ear: Tympanic membrane, ear canal and external ear normal. There is no impacted cerumen.     Left Ear: Tympanic membrane, ear canal and external ear normal. There is no impacted cerumen.     Nose: Nose normal.      Mouth/Throat:     Mouth: Mucous membranes are moist.     Pharynx: Oropharynx is clear. No oropharyngeal exudate or posterior oropharyngeal erythema.  Eyes:     General: No scleral icterus.       Right eye: No discharge.        Left eye: No discharge.     Conjunctiva/sclera: Conjunctivae normal.  Neck:     Vascular: No carotid bruit.  Cardiovascular:     Rate and Rhythm: Normal rate and regular rhythm.     Pulses: Normal pulses.     Heart sounds: Normal heart sounds.  Pulmonary:     Effort: Pulmonary effort is normal. No respiratory distress.     Breath sounds: Normal breath sounds.  Abdominal:     General: Abdomen is flat. Bowel sounds are normal.     Palpations: Abdomen is soft.  Musculoskeletal:        General: Normal range of motion.     Cervical back: Normal range of motion.  Skin:    General: Skin is warm and dry.  Neurological:     General: No focal deficit present.     Mental Status: She is alert and oriented to person, place, and time. Mental status is at baseline.  Psychiatric:        Mood and Affect: Mood normal.        Behavior: Behavior normal.        Thought Content: Thought content normal.        Judgment: Judgment normal.      Impression and Plan:  Encounter for preventive health examination -     CBC with Differential/Platelet; Future -     Comprehensive metabolic panel with GFR; Future -     Lipid panel; Future   -Recommend routine eye and dental care. -Healthy lifestyle discussed in detail. -Labs to be updated today. -Prostate cancer screening: Not applicable Health Maintenance  Topic Date Due   Pneumococcal Vaccine (1 of 2 - PCV) Never done   Pap with HPV screening  12/17/2015   Colon Cancer Screening  Never done   COVID-19 Vaccine (8 - 2025-26 season) 08/14/2024   Breast Cancer Screening  07/31/2026   DTaP/Tdap/Td vaccine (3 - Td or Tdap) 11/12/2030   Flu Shot  Completed   Hepatitis B Vaccine  Completed   Hepatitis C Screening  Completed    HIV Screening  Completed   HPV Vaccine  Aged Out   Meningitis B Vaccine  Aged Out       Shooter Tangen [name], MD [facility]

## 2024-11-23 ENCOUNTER — Ambulatory Visit: Payer: Self-pay | Admitting: Internal Medicine

## 2024-11-27 DIAGNOSIS — M9901 Segmental and somatic dysfunction of cervical region: Secondary | ICD-10-CM | POA: Diagnosis not present

## 2024-11-27 DIAGNOSIS — S39012A Strain of muscle, fascia and tendon of lower back, initial encounter: Secondary | ICD-10-CM | POA: Diagnosis not present

## 2024-11-27 DIAGNOSIS — M5382 Other specified dorsopathies, cervical region: Secondary | ICD-10-CM | POA: Diagnosis not present

## 2024-11-29 DIAGNOSIS — C7931 Secondary malignant neoplasm of brain: Secondary | ICD-10-CM | POA: Diagnosis not present

## 2024-11-29 DIAGNOSIS — C439 Malignant melanoma of skin, unspecified: Secondary | ICD-10-CM | POA: Diagnosis not present

## 2024-12-04 DIAGNOSIS — L988 Other specified disorders of the skin and subcutaneous tissue: Secondary | ICD-10-CM | POA: Diagnosis not present

## 2024-12-04 DIAGNOSIS — D485 Neoplasm of uncertain behavior of skin: Secondary | ICD-10-CM | POA: Diagnosis not present

## 2024-12-18 ENCOUNTER — Other Ambulatory Visit

## 2024-12-25 ENCOUNTER — Encounter (HOSPITAL_COMMUNITY): Payer: Self-pay

## 2024-12-25 ENCOUNTER — Ambulatory Visit (HOSPITAL_COMMUNITY)
Admission: RE | Admit: 2024-12-25 | Discharge: 2024-12-25 | Disposition: A | Discharge status: Home / Self Care | Source: Ambulatory Visit | Attending: Internal Medicine | Admitting: Internal Medicine

## 2024-12-25 ENCOUNTER — Inpatient Hospital Stay: Payer: Self-pay | Attending: Internal Medicine

## 2024-12-25 DIAGNOSIS — Z9226 Personal history of immune checkpoint inhibitor therapy: Secondary | ICD-10-CM | POA: Diagnosis not present

## 2024-12-25 DIAGNOSIS — C7802 Secondary malignant neoplasm of left lung: Secondary | ICD-10-CM | POA: Insufficient documentation

## 2024-12-25 DIAGNOSIS — Z923 Personal history of irradiation: Secondary | ICD-10-CM | POA: Diagnosis not present

## 2024-12-25 DIAGNOSIS — C439 Malignant melanoma of skin, unspecified: Secondary | ICD-10-CM | POA: Diagnosis present

## 2024-12-25 DIAGNOSIS — Z85820 Personal history of malignant melanoma of skin: Secondary | ICD-10-CM | POA: Insufficient documentation

## 2024-12-25 LAB — CMP (CANCER CENTER ONLY)
ALT: 28 U/L (ref 0–44)
AST: 40 U/L (ref 15–41)
Albumin: 4.7 g/dL (ref 3.5–5.0)
Alkaline Phosphatase: 75 U/L (ref 38–126)
Anion gap: 8 (ref 5–15)
BUN: 18 mg/dL (ref 6–20)
CO2: 27 mmol/L (ref 22–32)
Calcium: 9.6 mg/dL (ref 8.9–10.3)
Chloride: 102 mmol/L (ref 98–111)
Creatinine: 0.8 mg/dL (ref 0.44–1.00)
GFR, Estimated: >60 mL/min
Glucose, Bld: 85 mg/dL (ref 70–99)
Potassium: 4.9 mmol/L (ref 3.5–5.1)
Sodium: 137 mmol/L (ref 135–145)
Total Bilirubin: 0.4 mg/dL (ref 0.0–1.2)
Total Protein: 7.4 g/dL (ref 6.5–8.1)

## 2024-12-25 LAB — FERRITIN: Ferritin: 64 ng/mL (ref 11–307)

## 2024-12-25 LAB — CBC WITH DIFFERENTIAL (CANCER CENTER ONLY)
Abs Immature Granulocytes: 0.01 K/uL (ref 0.00–0.07)
Basophils Absolute: 0 K/uL (ref 0.0–0.1)
Basophils Relative: 1 %
Eosinophils Absolute: 0 K/uL (ref 0.0–0.5)
Eosinophils Relative: 1 %
HCT: 41.2 % (ref 36.0–46.0)
Hemoglobin: 14 g/dL (ref 12.0–15.0)
Immature Granulocytes: 0 %
Lymphocytes Relative: 16 %
Lymphs Abs: 1 K/uL (ref 0.7–4.0)
MCH: 30.8 pg (ref 26.0–34.0)
MCHC: 34 g/dL (ref 30.0–36.0)
MCV: 90.7 fL (ref 80.0–100.0)
Monocytes Absolute: 0.7 K/uL (ref 0.1–1.0)
Monocytes Relative: 11 %
Neutro Abs: 4.4 K/uL (ref 1.7–7.7)
Neutrophils Relative %: 71 %
Platelet Count: 248 K/uL (ref 150–400)
RBC: 4.54 MIL/uL (ref 3.87–5.11)
RDW: 12.8 % (ref 11.5–15.5)
WBC Count: 6.2 K/uL (ref 4.0–10.5)
nRBC: 0 % (ref 0.0–0.2)

## 2024-12-25 LAB — LACTATE DEHYDROGENASE: LDH: 235 U/L (ref 105–235)

## 2024-12-25 LAB — IRON AND IRON BINDING CAPACITY (CC-WL,HP ONLY)
Iron: 71 ug/dL (ref 28–170)
Saturation Ratios: 18 % (ref 10.4–31.8)
TIBC: 393 ug/dL (ref 250–450)
UIBC: 323 ug/dL

## 2024-12-25 MED ORDER — IOHEXOL 300 MG/ML  SOLN
100.0000 mL | Freq: Once | INTRAMUSCULAR | Status: AC | PRN
  Administered 2024-12-25: 100 mL via INTRAVENOUS

## 2024-12-26 ENCOUNTER — Inpatient Hospital Stay: Admitting: Internal Medicine

## 2024-12-26 VITALS — BP 116/68 | HR 76 | Temp 97.8°F | Resp 17 | Ht 68.0 in | Wt 142.0 lb

## 2024-12-26 DIAGNOSIS — C3432 Malignant neoplasm of lower lobe, left bronchus or lung: Secondary | ICD-10-CM

## 2024-12-26 DIAGNOSIS — C439 Malignant melanoma of skin, unspecified: Secondary | ICD-10-CM | POA: Diagnosis not present

## 2024-12-26 NOTE — Progress Notes (Signed)
 "     [facility] [phone]   [phone]  OFFICE PROGRESS NOTE  [name], [name], MD [address] [place] [address]  DIAGNOSIS: Metastatic high-grade neoplasm consistent with metastatic malignant melanoma based on the most recent pathology report from [facility] in October 2019, presented with large left left upper/left lower lobe mass with a hypermetabolic AP window lymph node as well as solitary metastatic brain lesion diagnosed in May 2019.  Biomarker Findings Microsatellite status - MS-Stable Tumor Mutational Burden - TMB-Intermediate (16 Muts/Mb) Genomic Findings For a complete list of the genes assayed, please refer to the Appendix. CD274 (PD-L1) amplification NRAS Q61R PDCD1LG2 (PD-L2) amplification MYC amplification EPHB1 amplification JAK2 amplification RB1 Q93*, E672* TERT promoter -146C>T TP53 C275W   PRIOR THERAPY: 1) left temporal craniotomy with resection of tumor with intraoperative stereotactic guidance for volumetric resection under the care of Dr. Louis on 04/29/2018. 2) First-line treatment with immunotherapy with Keytruda  200 mg IV every 3 weeks.  First dose June 02, 2018.  Status post 3 cycles.  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 3) palliative radiotherapy to the left lung mass under the care of Dr. Dewey completed on 08/24/2018. 4) Resuming her treatment again with Keytruda  200 mg IV every 3 weeks, first dose 09/08/2018.  Status post 2 cycles. 5) Treatment with immunotherapy with ipilimumab  3 mg/KG and nivolumab  1 mg/KG every 3 weeks for the first 4 cycles followed by maintenance nivolumab  240 mg IV every 2 weeks.  First dose of this treatment October 26, 2018.  Status post 40 cycles.  Ipilimumab  was discontinued after cycle #2 for the significant liver dysfunction.  Starting from cycle #3 the patient is on treatment with single agent  nivolumab . Starting from cycle #39 the patient is switching to nivolumab  480 mg IV every 4 weeks.  Her treatment is currently on hold since December 02, 2020.  CURRENT THERAPY: Observation.  INTERVAL HISTORY: Leslie [name] 49 y.o. female returns to the clinic today for 35-month follow-up visit.Discussed the use of AI scribe software for clinical note transcription with the patient, who gave verbal consent to proceed.  History of Present Illness Leslie [name] is a 49 year old female with metastatic melanoma status post craniotomy and immunotherapy who presents for routine surveillance and review of interval imaging.  She completed immunotherapy in December 2021 and remains under observation. At initial diagnosis in May 2019, she underwent craniotomy for brain metastasis. Since then, she has had no evidence of active disease. She continues regular dermatologic surveillance and recently had a skin lesion excised.  She denies new or worsening symptoms attributable to melanoma, including headaches, weight loss, or night sweats. She describes intermittent blurry vision in the mornings and evenings, managed with reading glasses; ophthalmologic evaluation in December 2025 was unremarkable. She previously experienced perimenopausal symptoms, including insomnia and night sweats, which have improved with adjustment of iron intake.  She remains concerned about the risk of melanoma recurrence but has no current questions regarding her disease status. She continues to be active, volunteers regularly, and maintains engagement with her care team.    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 Patient follows with Dr. Sherrod Sherrod at [facility], LOV as of 05/06/22 was on 01/12/22 in Epic.   Migraines    hx of migraines when pt was younger   Pneumothorax 12/2020   Patient had a chest tube/drain for about 4 days per pt.   Yeast infection     ALLERGIES:  is allergic to diflucan  [fluconazole ].  MEDICATIONS:  Current Outpatient Medications  Medication Sig Dispense Refill   AMERICAN GINSENG PO Take 1,500-3,000 mg by mouth daily. Depending on level of fatigue     CALCIUM-MAGNESIUM-VITAMIN D  ER PO      Coenzyme Q10 (COQ-10) 100 MG CAPS Take 1 tablet by mouth daily.     FeFum-FePoly-FA-B Cmp-C-Biot (INTEGRA PLUS ) CAPS Take 1 capsule by mouth every morning. 30 capsule 2   Methylsulfonylmethane (MSM) 1000 MG CAPS Take 4 capsules by mouth daily. 4000 mg (1/2 before workout and 1/2 afterwards)     NON FORMULARY Take 1 Dose by mouth as needed. Cordyceps 750 mg     NON FORMULARY Take 1 Dose by mouth daily. Curcumin Phytosome (Bioperrine) 3000 mg     SM Omega-3-6-9 Fatty Acids CAPS Take by mouth.     No current facility-administered medications for this visit.    SURGICAL HISTORY:  Past Surgical History:  Procedure Laterality Date   APPLICATION OF CRANIAL NAVIGATION Left 04/29/2018   Procedure: APPLICATION OF CRANIAL NAVIGATION;  Surgeon: Louis Shove, MD;  Location: [facility] OR;  Service: Neurosurgery;  Laterality: Left;   CRANIOTOMY Left 04/29/2018   Procedure: LEFT CRANIOTOMY FOR  TUMOR BRAIN LAB;  Surgeon: Louis Shove, MD;  Location: [facility] OR;  Service: Neurosurgery;  Laterality: Left;   HYSTEROSCOPY WITH D & C N/A 05/07/2022   Procedure: DILATATION AND CURETTAGE /HYSTEROSCOPY/ POLYPECTOMY;  Surgeon: Darcel Pool, MD;  Location: [facility];  Service: Gynecology;  Laterality: N/A;   WISDOM TOOTH EXTRACTION  1992    REVIEW OF SYSTEMS:  A comprehensive review of systems was  negative.   PHYSICAL EXAMINATION: General appearance: alert, cooperative, and no distress Head: Normocephalic, without obvious abnormality, atraumatic Neck: no adenopathy, no JVD, supple, symmetrical, trachea midline, and thyroid  not enlarged, symmetric, no tenderness/mass/nodules Lymph nodes: Cervical, supraclavicular, and axillary nodes normal. Resp: clear to auscultation bilaterally Back: negative, symmetric, no curvature. ROM normal. No CVA tenderness. Cardio: regular rate and rhythm, S1, S2 normal, no murmur, click, rub or gallop GI: soft, non-tender; bowel sounds normal; no masses,  no organomegaly Extremities: extremities normal, atraumatic, no cyanosis or edema  ECOG PERFORMANCE STATUS: 1 - Symptomatic but completely ambulatory  Blood pressure 116/68, pulse 76, temperature 97.8 F (36.6 C), temperature source Temporal, resp. rate 17, height 5' 8 (1.727 m), weight 142 lb (64.4 kg), last menstrual period 12/24/2024, SpO2 98%.  LABORATORY DATA: Lab Results  Component Value Date   WBC 6.2 12/25/2024   HGB 14.0 12/25/2024   HCT 41.2 12/25/2024   MCV 90.7 12/25/2024   PLT 248 12/25/2024      Chemistry      Component Value Date/Time   NA 137 12/25/2024 1004   K 4.9 12/25/2024 1004   CL 102 12/25/2024 1004   CO2 27 12/25/2024 1004   BUN 18 12/25/2024 1004   CREATININE 0.80 12/25/2024 1004      Component Value Date/Time   CALCIUM 9.6 12/25/2024 1004   ALKPHOS 75 12/25/2024 1004   AST 40 12/25/2024 1004  ALT 28 12/25/2024 1004   BILITOT 0.4 12/25/2024 1004       RADIOGRAPHIC STUDIES:    ASSESSMENT AND PLAN: This is a very pleasant 49 years old white female with highly suspicious metastatic malignant melanoma presented with large mass in the left upper/left lower lobe and mediastinal lymphadenopathy as well as solitary brain metastasis status post left temporal craniotomy and resection of tumor on 04/29/2018 and she is recovering well from her surgery. The patient  completed stereotactic radiotherapy to the resection cavity next week under the care of Dr. Dewey. The patient underwent treatment with immunotherapy with Keytruda  200 mg IV every 3 weeks status post 3 cycles.   Her scan after cycle #3 showed enlargement of the left upper lobe lung mass.  This was also suspicious for pseudo-progression on immunotherapy.  The patient was started on a palliative course of radiotherapy to the left upper lobe lung mass and she tolerated this treatment fairly well.  She was seen by Dr. Arnold at [facility] and she recommended for the patient to resume her treatment with Keytruda  for now until she undergoes further molecular studies.  The patient was treated with 2 more cycles of Keytruda  and tolerated the treatment well. Repeat CT scan of the chest, abdomen and pelvis at that time showed some improvement in the left upper lobe lung mass but there is still concern about pericardial invasion. She is still not a good candidate for surgical resection because of the pericardial invasion. The final pathology report and recommendation from [facility] was consistent with metastatic melanoma and the recommendation is to switch the patient to a combination immunotherapy with Ipilumumab and nivolumab . The patient underwent treatment with immunotherapy with ipilimumab  and nivolumab  status post 2 cycles.  Her treatment was on hold for more than 6 weeks secondary to grade 3 hepatic dysfunction.  She had improvement of her liver enzyme after a prolonged treatment with a steroid with a tapering schedule. She resumed her treatment again with single agent nivolumab .  She is status post 40 cycles of treatment. She has tolerated her treatment well with no concerning adverse effects. Starting from cycle #39 the patient was treated with nivolumab  480 mg IV every 4 weeks. The patient is currently on observation since her last dose on December 02, 2020. She had repeat CT  scan of the chest, abdomen pelvis performed recently.  I personally independently reviewed the scan and discussed the results with the patient today.  Her scan showed no concerning findings for disease progression. Assessment and Plan Assessment & Plan Metastatic melanoma She remains asymptomatic with no evidence of active disease on recent CT imaging. She has discontinued systemic therapy since December 2021 and is currently under observation. Dermatologic surveillance continues, with a recent skin lesion excised and found to be benign. The unpredictable course of melanoma and the necessity for ongoing surveillance were discussed. - Reviewed recent CT scans of chest, abdomen, and pelvis, which demonstrated no active disease. - Recommended continued observation with scheduled follow-up imaging. - Planned whole body PET scan in six months for comprehensive surveillance. - Advised continuation of regular dermatologic skin examinations. The patient was advised to call immediately if she has any concerning symptoms in the interval.  The patient voices understanding of current disease status and treatment options and is in agreement with the current care plan. All questions were answered. The patient knows to call the clinic with any problems, questions or concerns. We can certainly see the patient much sooner if necessary.  Disclaimer: This note was dictated with voice recognition software. Similar sounding words can inadvertently be transcribed and may not be corrected upon review.       "

## 2025-06-19 ENCOUNTER — Inpatient Hospital Stay

## 2025-06-26 ENCOUNTER — Inpatient Hospital Stay: Admitting: Internal Medicine
